# Patient Record
Sex: Female | Born: 1935 | Race: White | Hispanic: No | Smoking: Never smoker
Health system: Southern US, Community
[De-identification: ages and names within clinical notes are randomized; demographics above are authoritative.]

## PROBLEM LIST (undated history)

## (undated) DIAGNOSIS — S7290XA Unspecified fracture of unspecified femur, initial encounter for closed fracture: Secondary | ICD-10-CM

## (undated) DIAGNOSIS — Z950 Presence of cardiac pacemaker: Secondary | ICD-10-CM

## (undated) DIAGNOSIS — F0390 Unspecified dementia without behavioral disturbance: Secondary | ICD-10-CM

## (undated) DIAGNOSIS — R5381 Other malaise: Secondary | ICD-10-CM

## (undated) DIAGNOSIS — E785 Hyperlipidemia, unspecified: Secondary | ICD-10-CM

## (undated) DIAGNOSIS — R7302 Impaired glucose tolerance (oral): Secondary | ICD-10-CM

## (undated) DIAGNOSIS — F068 Other specified mental disorders due to known physiological condition: Secondary | ICD-10-CM

## (undated) DIAGNOSIS — G459 Transient cerebral ischemic attack, unspecified: Secondary | ICD-10-CM

## (undated) DIAGNOSIS — K5730 Diverticulosis of large intestine without perforation or abscess without bleeding: Secondary | ICD-10-CM

## (undated) DIAGNOSIS — R4701 Aphasia: Secondary | ICD-10-CM

## (undated) DIAGNOSIS — Z8679 Personal history of other diseases of the circulatory system: Secondary | ICD-10-CM

## (undated) DIAGNOSIS — I442 Atrioventricular block, complete: Secondary | ICD-10-CM

## (undated) DIAGNOSIS — E039 Hypothyroidism, unspecified: Secondary | ICD-10-CM

## (undated) DIAGNOSIS — I10 Essential (primary) hypertension: Secondary | ICD-10-CM

## (undated) DIAGNOSIS — I509 Heart failure, unspecified: Secondary | ICD-10-CM

## (undated) DIAGNOSIS — I6350 Cerebral infarction due to unspecified occlusion or stenosis of unspecified cerebral artery: Secondary | ICD-10-CM

## (undated) DIAGNOSIS — M1990 Unspecified osteoarthritis, unspecified site: Secondary | ICD-10-CM

## (undated) DIAGNOSIS — R5383 Other fatigue: Secondary | ICD-10-CM

## (undated) DIAGNOSIS — D509 Iron deficiency anemia, unspecified: Secondary | ICD-10-CM

## (undated) DIAGNOSIS — I2510 Atherosclerotic heart disease of native coronary artery without angina pectoris: Secondary | ICD-10-CM

## (undated) DIAGNOSIS — E739 Lactose intolerance, unspecified: Secondary | ICD-10-CM

## (undated) HISTORY — DX: Iron deficiency anemia, unspecified: D50.9

## (undated) HISTORY — DX: Transient cerebral ischemic attack, unspecified: G45.9

## (undated) HISTORY — DX: Other fatigue: R53.83

## (undated) HISTORY — DX: Personal history of other diseases of the circulatory system: Z86.79

## (undated) HISTORY — DX: Other specified mental disorders due to known physiological condition: F06.8

## (undated) HISTORY — PX: ABDOMINAL HYSTERECTOMY: SHX81

## (undated) HISTORY — DX: Hypothyroidism, unspecified: E03.9

## (undated) HISTORY — DX: Other malaise: R53.81

## (undated) HISTORY — PX: CHOLECYSTECTOMY: SHX55

## (undated) HISTORY — DX: Hyperlipidemia, unspecified: E78.5

## (undated) HISTORY — DX: Presence of cardiac pacemaker: Z95.0

## (undated) HISTORY — DX: Impaired glucose tolerance (oral): R73.02

## (undated) HISTORY — DX: Essential (primary) hypertension: I10

## (undated) HISTORY — DX: Cerebral infarction due to unspecified occlusion or stenosis of unspecified cerebral artery: I63.50

## (undated) HISTORY — DX: Lactose intolerance, unspecified: E73.9

## (undated) HISTORY — DX: Atrioventricular block, complete: I44.2

## (undated) HISTORY — DX: Aphasia: R47.01

## (undated) HISTORY — DX: Diverticulosis of large intestine without perforation or abscess without bleeding: K57.30

---

## 1989-09-26 HISTORY — PX: OTHER SURGICAL HISTORY: SHX169

## 1998-11-24 ENCOUNTER — Other Ambulatory Visit: Admission: RE | Admit: 1998-11-24 | Discharge: 1998-11-24 | Payer: Self-pay | Admitting: Obstetrics and Gynecology

## 1999-12-07 ENCOUNTER — Other Ambulatory Visit: Admission: RE | Admit: 1999-12-07 | Discharge: 1999-12-07 | Payer: Self-pay | Admitting: Obstetrics and Gynecology

## 2000-01-19 ENCOUNTER — Encounter: Admission: RE | Admit: 2000-01-19 | Discharge: 2000-01-19 | Payer: Self-pay | Admitting: Obstetrics and Gynecology

## 2000-01-19 ENCOUNTER — Encounter: Payer: Self-pay | Admitting: Obstetrics and Gynecology

## 2000-09-26 DIAGNOSIS — Z950 Presence of cardiac pacemaker: Secondary | ICD-10-CM

## 2000-09-26 HISTORY — DX: Presence of cardiac pacemaker: Z95.0

## 2002-09-26 LAB — HM COLONOSCOPY: HM Colonoscopy: NORMAL

## 2003-05-12 ENCOUNTER — Encounter: Payer: Self-pay | Admitting: Emergency Medicine

## 2003-05-12 ENCOUNTER — Inpatient Hospital Stay (HOSPITAL_COMMUNITY): Admission: EM | Admit: 2003-05-12 | Discharge: 2003-05-14 | Payer: Self-pay | Admitting: Emergency Medicine

## 2003-05-12 ENCOUNTER — Encounter: Payer: Self-pay | Admitting: Neurology

## 2003-05-13 ENCOUNTER — Encounter: Payer: Self-pay | Admitting: Neurology

## 2003-05-14 DIAGNOSIS — G459 Transient cerebral ischemic attack, unspecified: Secondary | ICD-10-CM | POA: Insufficient documentation

## 2003-05-14 HISTORY — DX: Transient cerebral ischemic attack, unspecified: G45.9

## 2003-06-30 ENCOUNTER — Emergency Department (HOSPITAL_COMMUNITY): Admission: EM | Admit: 2003-06-30 | Discharge: 2003-06-30 | Payer: Self-pay | Admitting: *Deleted

## 2003-06-30 ENCOUNTER — Encounter: Payer: Self-pay | Admitting: Emergency Medicine

## 2003-09-24 ENCOUNTER — Ambulatory Visit (HOSPITAL_COMMUNITY): Admission: RE | Admit: 2003-09-24 | Discharge: 2003-09-24 | Payer: Self-pay | Admitting: Gastroenterology

## 2003-12-27 ENCOUNTER — Emergency Department (HOSPITAL_COMMUNITY): Admission: EM | Admit: 2003-12-27 | Discharge: 2003-12-27 | Payer: Self-pay | Admitting: Emergency Medicine

## 2004-01-02 ENCOUNTER — Ambulatory Visit (HOSPITAL_COMMUNITY): Admission: RE | Admit: 2004-01-02 | Discharge: 2004-01-02 | Payer: Self-pay | Admitting: Neurology

## 2004-05-07 ENCOUNTER — Emergency Department (HOSPITAL_COMMUNITY): Admission: EM | Admit: 2004-05-07 | Discharge: 2004-05-07 | Payer: Self-pay | Admitting: *Deleted

## 2004-11-24 ENCOUNTER — Encounter: Admission: RE | Admit: 2004-11-24 | Discharge: 2004-11-24 | Payer: Self-pay | Admitting: Internal Medicine

## 2004-12-13 ENCOUNTER — Ambulatory Visit: Payer: Self-pay

## 2005-04-27 ENCOUNTER — Ambulatory Visit: Payer: Self-pay | Admitting: Internal Medicine

## 2005-06-20 ENCOUNTER — Ambulatory Visit: Payer: Self-pay

## 2005-10-04 ENCOUNTER — Ambulatory Visit: Payer: Self-pay | Admitting: Internal Medicine

## 2005-10-25 ENCOUNTER — Ambulatory Visit (HOSPITAL_COMMUNITY): Admission: RE | Admit: 2005-10-25 | Discharge: 2005-10-25 | Payer: Self-pay | Admitting: Internal Medicine

## 2005-12-08 ENCOUNTER — Encounter: Admission: RE | Admit: 2005-12-08 | Discharge: 2005-12-08 | Payer: Self-pay | Admitting: Internal Medicine

## 2005-12-13 ENCOUNTER — Ambulatory Visit: Payer: Self-pay | Admitting: Internal Medicine

## 2006-01-25 ENCOUNTER — Ambulatory Visit: Payer: Self-pay | Admitting: Internal Medicine

## 2006-03-01 ENCOUNTER — Ambulatory Visit: Payer: Self-pay | Admitting: Internal Medicine

## 2006-04-05 ENCOUNTER — Ambulatory Visit: Payer: Self-pay | Admitting: Internal Medicine

## 2006-05-25 ENCOUNTER — Ambulatory Visit: Payer: Self-pay | Admitting: Internal Medicine

## 2006-05-31 ENCOUNTER — Ambulatory Visit: Payer: Self-pay | Admitting: Internal Medicine

## 2006-06-28 ENCOUNTER — Ambulatory Visit: Payer: Self-pay | Admitting: Internal Medicine

## 2006-08-30 ENCOUNTER — Ambulatory Visit: Payer: Self-pay | Admitting: Internal Medicine

## 2006-09-25 ENCOUNTER — Ambulatory Visit: Payer: Self-pay | Admitting: Internal Medicine

## 2006-11-01 ENCOUNTER — Ambulatory Visit: Payer: Self-pay | Admitting: Internal Medicine

## 2006-12-05 ENCOUNTER — Ambulatory Visit: Payer: Self-pay | Admitting: Internal Medicine

## 2006-12-06 ENCOUNTER — Ambulatory Visit: Payer: Self-pay | Admitting: Internal Medicine

## 2006-12-13 ENCOUNTER — Encounter: Admission: RE | Admit: 2006-12-13 | Discharge: 2006-12-13 | Payer: Self-pay | Admitting: Internal Medicine

## 2007-01-01 ENCOUNTER — Encounter: Admission: RE | Admit: 2007-01-01 | Discharge: 2007-01-01 | Payer: Self-pay | Admitting: Internal Medicine

## 2007-03-07 ENCOUNTER — Ambulatory Visit: Payer: Self-pay | Admitting: Internal Medicine

## 2007-06-06 ENCOUNTER — Ambulatory Visit: Payer: Self-pay | Admitting: Internal Medicine

## 2007-07-12 ENCOUNTER — Telehealth (INDEPENDENT_AMBULATORY_CARE_PROVIDER_SITE_OTHER): Payer: Self-pay | Admitting: *Deleted

## 2007-09-05 ENCOUNTER — Ambulatory Visit: Payer: Self-pay | Admitting: Internal Medicine

## 2007-11-20 ENCOUNTER — Ambulatory Visit: Payer: Self-pay | Admitting: Internal Medicine

## 2007-11-20 DIAGNOSIS — I10 Essential (primary) hypertension: Secondary | ICD-10-CM

## 2007-11-20 DIAGNOSIS — K5730 Diverticulosis of large intestine without perforation or abscess without bleeding: Secondary | ICD-10-CM | POA: Insufficient documentation

## 2007-11-20 DIAGNOSIS — E039 Hypothyroidism, unspecified: Secondary | ICD-10-CM

## 2007-11-20 DIAGNOSIS — R5383 Other fatigue: Secondary | ICD-10-CM

## 2007-11-20 DIAGNOSIS — Z8679 Personal history of other diseases of the circulatory system: Secondary | ICD-10-CM

## 2007-11-20 DIAGNOSIS — E785 Hyperlipidemia, unspecified: Secondary | ICD-10-CM

## 2007-11-20 DIAGNOSIS — R5381 Other malaise: Secondary | ICD-10-CM

## 2007-11-20 HISTORY — DX: Hyperlipidemia, unspecified: E78.5

## 2007-11-20 HISTORY — DX: Diverticulosis of large intestine without perforation or abscess without bleeding: K57.30

## 2007-11-20 HISTORY — DX: Essential (primary) hypertension: I10

## 2007-11-20 HISTORY — DX: Personal history of other diseases of the circulatory system: Z86.79

## 2007-11-20 HISTORY — DX: Hypothyroidism, unspecified: E03.9

## 2007-11-20 HISTORY — DX: Other malaise: R53.81

## 2007-11-20 LAB — CONVERTED CEMR LAB
ALT: 18 units/L (ref 0–35)
AST: 22 units/L (ref 0–37)
Albumin: 3.6 g/dL (ref 3.5–5.2)
Alkaline Phosphatase: 72 units/L (ref 39–117)
BUN: 15 mg/dL (ref 6–23)
Basophils Absolute: 0.1 10*3/uL (ref 0.0–0.1)
Basophils Relative: 0.8 % (ref 0.0–1.0)
Bilirubin, Direct: 0.1 mg/dL (ref 0.0–0.3)
CO2: 33 meq/L — ABNORMAL HIGH (ref 19–32)
Calcium: 9.4 mg/dL (ref 8.4–10.5)
Chloride: 102 meq/L (ref 96–112)
Cholesterol: 137 mg/dL (ref 0–200)
Creatinine, Ser: 0.9 mg/dL (ref 0.4–1.2)
Eosinophils Absolute: 0.5 10*3/uL (ref 0.0–0.6)
Eosinophils Relative: 5 % (ref 0.0–5.0)
GFR calc Af Amer: 77 mL/min
GFR calc non Af Amer: 64 mL/min
Glucose, Bld: 109 mg/dL — ABNORMAL HIGH (ref 70–99)
HCT: 41.9 % (ref 36.0–46.0)
HDL: 46 mg/dL (ref >39.0)
Hemoglobin: 13.7 g/dL (ref 12.0–15.0)
LDL Cholesterol: 70 mg/dL (ref 0–99)
Lymphocytes Relative: 16.8 % (ref 12.0–46.0)
MCHC: 32.8 g/dL (ref 30.0–36.0)
MCV: 94.6 fL (ref 78.0–100.0)
Monocytes Absolute: 0.8 10*3/uL — ABNORMAL HIGH (ref 0.2–0.7)
Monocytes Relative: 7.8 % (ref 3.0–11.0)
Neutro Abs: 7 10*3/uL (ref 1.4–7.7)
Neutrophils Relative %: 69.6 % (ref 43.0–77.0)
Platelets: 353 10*3/uL (ref 150–400)
Potassium: 3.4 meq/L — ABNORMAL LOW (ref 3.5–5.1)
RBC: 4.43 M/uL (ref 3.87–5.11)
RDW: 12 % (ref 11.5–14.6)
Sodium: 141 meq/L (ref 135–145)
TSH: 4.66 microintl units/mL (ref 0.35–5.50)
Total Bilirubin: 0.5 mg/dL (ref 0.3–1.2)
Total CHOL/HDL Ratio: 3
Total Protein: 7 g/dL (ref 6.0–8.3)
Triglycerides: 106 mg/dL (ref 0–149)
VLDL: 21 mg/dL (ref 0–40)
WBC: 10.1 10*3/uL (ref 4.5–10.5)

## 2007-12-12 ENCOUNTER — Ambulatory Visit: Payer: Self-pay | Admitting: Internal Medicine

## 2007-12-27 ENCOUNTER — Encounter: Admission: RE | Admit: 2007-12-27 | Discharge: 2007-12-27 | Payer: Self-pay | Admitting: Internal Medicine

## 2008-03-12 ENCOUNTER — Ambulatory Visit: Payer: Self-pay | Admitting: Internal Medicine

## 2008-05-19 ENCOUNTER — Ambulatory Visit: Payer: Self-pay | Admitting: Internal Medicine

## 2008-05-19 DIAGNOSIS — F068 Other specified mental disorders due to known physiological condition: Secondary | ICD-10-CM

## 2008-05-19 DIAGNOSIS — F0390 Unspecified dementia without behavioral disturbance: Secondary | ICD-10-CM | POA: Insufficient documentation

## 2008-05-19 HISTORY — DX: Other specified mental disorders due to known physiological condition: F06.8

## 2008-06-11 ENCOUNTER — Ambulatory Visit: Payer: Self-pay | Admitting: Internal Medicine

## 2008-09-11 ENCOUNTER — Ambulatory Visit: Payer: Self-pay | Admitting: Internal Medicine

## 2008-11-01 ENCOUNTER — Telehealth: Payer: Self-pay | Admitting: Internal Medicine

## 2008-11-17 ENCOUNTER — Ambulatory Visit: Payer: Self-pay | Admitting: Internal Medicine

## 2008-11-17 LAB — CONVERTED CEMR LAB
AST: 23 units/L (ref 0–37)
Albumin: 3.5 g/dL (ref 3.5–5.2)
Basophils Absolute: 0.1 10*3/uL (ref 0.0–0.1)
Basophils Relative: 0.9 % (ref 0.0–3.0)
Calcium: 9.1 mg/dL (ref 8.4–10.5)
Cholesterol: 142 mg/dL (ref 0–200)
Creatinine, Ser: 0.9 mg/dL (ref 0.4–1.2)
Folate: >20 ng/mL
Glucose, Bld: 100 mg/dL — ABNORMAL HIGH (ref 70–99)
Hemoglobin: 12.2 g/dL (ref 12.0–15.0)
LDL Cholesterol: 66 mg/dL (ref 0–99)
MCHC: 35.3 g/dL (ref 30.0–36.0)
Monocytes Absolute: 0.7 10*3/uL (ref 0.1–1.0)
Monocytes Relative: 8.2 % (ref 3.0–12.0)
Neutro Abs: 5.6 10*3/uL (ref 1.4–7.7)
Sed Rate: 40 mm/hr — ABNORMAL HIGH (ref 0–22)
Sodium: 141 meq/L (ref 135–145)
TSH: 1.8 microintl units/mL (ref 0.35–5.50)
Total Protein: 6.5 g/dL (ref 6.0–8.3)
VLDL: 23 mg/dL (ref 0–40)
Vitamin B-12: >1500 pg/mL — ABNORMAL HIGH (ref 211–911)
WBC: 9 10*3/uL (ref 4.5–10.5)

## 2008-12-25 LAB — HM MAMMOGRAPHY: HM Mammogram: NORMAL

## 2009-01-02 ENCOUNTER — Encounter: Payer: Self-pay | Admitting: Internal Medicine

## 2009-01-09 ENCOUNTER — Encounter: Payer: Self-pay | Admitting: Internal Medicine

## 2009-01-09 ENCOUNTER — Ambulatory Visit: Payer: Self-pay | Admitting: Internal Medicine

## 2009-01-09 DIAGNOSIS — I639 Cerebral infarction, unspecified: Secondary | ICD-10-CM

## 2009-01-09 DIAGNOSIS — R4701 Aphasia: Secondary | ICD-10-CM

## 2009-01-09 DIAGNOSIS — Z950 Presence of cardiac pacemaker: Secondary | ICD-10-CM

## 2009-01-09 DIAGNOSIS — I442 Atrioventricular block, complete: Secondary | ICD-10-CM

## 2009-01-09 DIAGNOSIS — I6350 Cerebral infarction due to unspecified occlusion or stenosis of unspecified cerebral artery: Secondary | ICD-10-CM

## 2009-01-09 DIAGNOSIS — R32 Unspecified urinary incontinence: Secondary | ICD-10-CM

## 2009-01-09 HISTORY — DX: Presence of cardiac pacemaker: Z95.0

## 2009-01-09 HISTORY — DX: Atrioventricular block, complete: I44.2

## 2009-01-09 HISTORY — DX: Aphasia: R47.01

## 2009-01-09 HISTORY — DX: Cerebral infarction due to unspecified occlusion or stenosis of unspecified cerebral artery: I63.50

## 2009-04-07 ENCOUNTER — Ambulatory Visit: Payer: Self-pay | Admitting: Internal Medicine

## 2009-04-08 ENCOUNTER — Ambulatory Visit: Payer: Self-pay | Admitting: Internal Medicine

## 2009-04-09 ENCOUNTER — Telehealth: Payer: Self-pay | Admitting: Internal Medicine

## 2009-04-13 ENCOUNTER — Encounter: Payer: Self-pay | Admitting: Internal Medicine

## 2009-04-14 ENCOUNTER — Telehealth: Payer: Self-pay | Admitting: Internal Medicine

## 2009-04-15 ENCOUNTER — Ambulatory Visit (HOSPITAL_COMMUNITY): Admission: RE | Admit: 2009-04-15 | Discharge: 2009-04-15 | Payer: Self-pay | Admitting: Internal Medicine

## 2009-04-15 ENCOUNTER — Ambulatory Visit: Payer: Self-pay | Admitting: Internal Medicine

## 2009-04-16 ENCOUNTER — Encounter: Payer: Self-pay | Admitting: Internal Medicine

## 2009-04-29 ENCOUNTER — Ambulatory Visit: Payer: Self-pay

## 2009-04-29 ENCOUNTER — Encounter: Payer: Self-pay | Admitting: Internal Medicine

## 2009-05-18 ENCOUNTER — Ambulatory Visit: Payer: Self-pay | Admitting: Internal Medicine

## 2009-08-14 ENCOUNTER — Ambulatory Visit: Payer: Self-pay | Admitting: Internal Medicine

## 2009-10-13 ENCOUNTER — Emergency Department (HOSPITAL_BASED_OUTPATIENT_CLINIC_OR_DEPARTMENT_OTHER): Admission: EM | Admit: 2009-10-13 | Discharge: 2009-10-13 | Payer: Self-pay | Admitting: Emergency Medicine

## 2009-10-13 ENCOUNTER — Ambulatory Visit: Payer: Self-pay | Admitting: Radiology

## 2009-11-18 ENCOUNTER — Ambulatory Visit: Payer: Self-pay | Admitting: Internal Medicine

## 2009-11-19 LAB — CONVERTED CEMR LAB
AST: 24 units/L (ref 0–37)
Alkaline Phosphatase: 69 units/L (ref 39–117)
Basophils Absolute: 0.1 10*3/uL (ref 0.0–0.1)
Bilirubin Urine: NEGATIVE
Bilirubin, Direct: 0.1 mg/dL (ref 0.0–0.3)
CO2: 27 meq/L (ref 19–32)
Calcium: 8.6 mg/dL (ref 8.4–10.5)
Cholesterol: 145 mg/dL (ref 0–200)
Eosinophils Absolute: 0.3 10*3/uL (ref 0.0–0.7)
Folate: >20 ng/mL
HDL: 56.7 mg/dL (ref >39.00)
Hemoglobin: 12.5 g/dL (ref 12.0–15.0)
Hgb A1c MFr Bld: 6 % (ref 4.6–6.5)
Lymphs Abs: 2.1 10*3/uL (ref 0.7–4.0)
MCHC: 33.4 g/dL (ref 30.0–36.0)
MCV: 95.1 fL (ref 78.0–100.0)
Monocytes Relative: 10.1 % (ref 3.0–12.0)
RBC: 3.93 M/uL (ref 3.87–5.11)
Sed Rate: 31 mm/hr — ABNORMAL HIGH (ref 0–22)
Sodium: 142 meq/L (ref 135–145)
Specific Gravity, Urine: <=1.005 (ref 1.000–1.030)
TSH: 1.83 microintl units/mL (ref 0.35–5.50)
Total Bilirubin: 0.3 mg/dL (ref 0.3–1.2)
Total Protein, Urine: NEGATIVE mg/dL
Total Protein: 6.6 g/dL (ref 6.0–8.3)
Urobilinogen, UA: 0.2 (ref 0.0–1.0)
VLDL: 53.2 mg/dL — ABNORMAL HIGH (ref 0.0–40.0)
pH: 5.5 (ref 5.0–8.0)

## 2009-11-27 ENCOUNTER — Telehealth: Payer: Self-pay | Admitting: Internal Medicine

## 2009-12-01 ENCOUNTER — Telehealth: Payer: Self-pay | Admitting: Internal Medicine

## 2010-05-14 ENCOUNTER — Encounter: Payer: Self-pay | Admitting: Internal Medicine

## 2010-05-19 ENCOUNTER — Ambulatory Visit: Payer: Self-pay | Admitting: Internal Medicine

## 2010-07-19 ENCOUNTER — Encounter: Payer: Self-pay | Admitting: Internal Medicine

## 2010-07-23 ENCOUNTER — Encounter (INDEPENDENT_AMBULATORY_CARE_PROVIDER_SITE_OTHER): Payer: Self-pay | Admitting: *Deleted

## 2010-08-10 ENCOUNTER — Ambulatory Visit: Payer: Self-pay | Admitting: Internal Medicine

## 2010-10-26 NOTE — Progress Notes (Signed)
  Phone Note Refill Request  on December 01, 2009 1:12 PM  Refills Requested: Medication #1:  AGGRENOX 25-200 MG CP12 1 by mouth two times a day   Dosage confirmed as above?Dosage Confirmed   Notes: [facility] [address] Initial call taken by: Scharlene Gloss,  December 01, 2009 1:12 PM    Prescriptions: AGGRENOX 25-200 MG CP12 (ASPIRIN-DIPYRIDAMOLE) 1 by mouth two times a day  #60 x 11   Entered by:   Zella Ball Ewing   Authorized by:   Corwin Levins MD   Signed by:   Scharlene Gloss on 12/01/2009   Method used:   Faxed to ...       [facility]  [address] [phone]* (retail)       [address]       [place], [place]  [address]       Ph: [phone] or [phone]       Fax: [phone]   RxID:   [id number]

## 2010-10-26 NOTE — Assessment & Plan Note (Signed)
Summary: pc2/medtronic/lg    Visit Type:  Follow-up Referring Provider:  Dr. Pearlean Brownie Primary Provider:  Wendie Simmer   History of Present Illness: [name] returns today for followup.  She is a pleasant elderly woman with a h/o CHB, s/p PPM, a h/o HTN and CVA.  She denies c/p or sob.  She admits to some trouble with her memory.  No change in appetite, constipation or diarrhea. She has remained active. No peripheral edema.  Current Medications (verified): 1)  Aggrenox 25-200 Mg Cp12 (Aspirin-Dipyridamole) .Marland Kitchen.. 1 By Mouth Two Times A Day 2)  Aricept 10 Mg Tabs (Donepezil Hcl) .... Take 1 Tablet By Mouth Once A Day 3)  Diovan 160 Mg Tabs (Valsartan) .Marland Kitchen.. 1 By Mouth Once Daily 4)  Foltx 2.5-25-2 Mg Tabs (Fa-Pyridoxine-Cyancobalamin) .Marland Kitchen.. 1 Tablet By Mouth Once A Day 5)  Hydrochlorothiazide 12.5 Mg Caps (Hydrochlorothiazide) .Marland Kitchen.. 1 By Mouth Once Daily 6)  Lipitor 10 Mg Tabs (Atorvastatin Calcium) .Marland Kitchen.. 1 By Mouth Once Daily 7)  Norvasc 5 Mg Tabs (Amlodipine Besylate) .Marland Kitchen.. 1po Once Daily 8)  Synthroid 112 Mcg Tabs (Levothyroxine Sodium) .... Take 1 Tablet By Mouth Once A Day 9)  Caltrate 600+d 600-400 Mg-Unit Tabs (Calcium Carbonate-Vitamin D) .Marland Kitchen.. 1 By Mouth Once Daily 10)  Multivitamins   Tabs (Multiple Vitamin) .Marland Kitchen.. 1 By Mouth Once Daily  Allergies: 1)  ! Codeine  Past History:  Past Medical History: Last updated: 01/09/2009  PACEMAKER (ICD-V45.Marland Kitchen01) AV BLOCK, COMPLETE (ICD-426.0) URINARY INCONTINENCE (ICD-788.30) APHASIA (ICD-784.3) TIA (ICD-435.9) CVA (ICD-434.91) DEMENTIA (ICD-294.8) FATIGUE (ICD-780.79) DIVERTICULOSIS, COLON (ICD-562.10) HYPOTHYROIDISM (ICD-244.9) CEREBROVASCULAR ACCIDENT, HX OF (ICD-V12.50) HYPERTENSION (ICD-401.9) HYPERLIPIDEMIA (ICD-272.4)    Past Surgical History: Last updated: 08/13/2009 Medtronic Cynthia dual-chamber pacemaker.serial number was [id number]....04/15/2009  Hysterectomy s/p cerebral aneurysm 1991 s/p pacemaker  DDD Cholecystectomy s/p retinal re-attachment s/p thyroidectomy  Review of Systems  The patient denies chest pain, syncope, dyspnea on exertion, and peripheral edema.    Vital Signs:  Patient profile:   77 year old female Height:      66 inches Weight:      130 pounds BMI:     21.06 Pulse rate:   76 / minute BP sitting:   122 / 60  (left arm)  Vitals Entered By: Laurance Flatten CMA (August 10, 2010 10:23 AM)  Physical Exam  General:  alert and underweight appearing.   Head:  normocephalic and atraumatic.   Eyes:  vision grossly intact, pupils equal, and pupils round.   Mouth:  no gingival abnormalities and pharynx pink and moist.   Neck:  supple and no masses.   Chest Wall:  Well healed PPM incision. Lungs:  normal respiratory effort and normal breath sounds.   Heart:  normal rate and regular rhythm.   Abdomen:  soft, non-tender, and normal bowel sounds.   Msk:  no joint tenderness and no joint swelling.   Pulses:  pulses normal in all 4 extremities Extremities:  no edema, no erythema  Neurologic:  remains pleasantly demented   PPM Specifications Following MD:  Lewayne Bunting, MD     PPM Vendor:  Medtronic     PPM Model Number:  [id number]     PPM Serial Number:  [id number] PPM DOI:  04/15/2009     PPM Implanting MD:  Lewayne Bunting, MD  Lead 1    Location: RA     DOI: 06/16/2007     Model #: [id number]     Serial #: [id number]     Status: active Lead 2  Location: RV     DOI: 06/15/2001     Model #: 1610     Serial #: [id number]     Status: active  Magnet Response Rate:  BOL 85 ERI 65  Indications:  CHB Carelink  Explantation Comments:  04/15/2009 [id number] H explanted  PPM Follow Up Battery Voltage:  2.79 V     Battery Est. Longevity:  10.5 YRS     Pacer Dependent:  No       PPM Device Measurements Atrium  Amplitude: 5.60 mV, Impedance: 626 ohms, Threshold: 1.00 V at 0.40 msec Right Ventricle  Amplitude: 11.20 mV, Impedance: 573 ohms, Threshold: 0.750 V at 0.40  msec  Episodes MS Episodes:  24     Percent Mode Switch:  <0.1%     Coumadin:  No Ventricular High Rate:  0     Atrial Pacing:  31.4%     Ventricular Pacing:  99.9%  Parameters Mode:  DDDR     Lower Rate Limit:  60     Upper Rate Limit:  130 Paced AV Delay:  150     Sensed AV Delay:  120 Next Remote Date:  11/11/2010     Next Cardiology Appt Due:  07/28/2011 Tech Comments:  24 MODE SWITCHES.  NORMAL DEVICE FUNCTION.  CHANGED RA OUTPUT FROM 1.5 TO 2.0 AND RV OUTPUT FROM 2.0 TO 2.5 V. CARELINK 11-11-10 AND ROV IN 12 MTHS W/GT. Vella Kohler  August 10, 2010 10:35 AM MD Comments:  Agree with above.  Impression & Recommendations:  Problem # 1:  PACEMAKER, PERMANENT (ICD-V45.01) Her device has worked well.  Will recheck in several months.  Problem # 2:  HYPERTENSION (ICD-401.9) Her blood pressure is well controlled.  Continue meds as below. Her updated medication list for this problem includes:    Diovan 160 Mg Tabs (Valsartan) .Marland Kitchen... 1 by mouth once daily    Hydrochlorothiazide 12.5 Mg Caps (Hydrochlorothiazide) .Marland Kitchen... 1 by mouth once daily    Norvasc 5 Mg Tabs (Amlodipine besylate) .Marland Kitchen... 1po once daily

## 2010-10-26 NOTE — Progress Notes (Signed)
Summary: med refill  Phone Note From Pharmacy   Summary of Call: Requesting refill of Folbic Tablet. Please advise. Initial call taken by: Lucious Groves,  November 27, 2009 12:18 PM  Follow-up for Phone Call        thanks. Closed phone note. Follow-up by: Lucious Groves,  November 27, 2009 3:46 PM    New/Updated Medications: FOLTX 2.5-25-2 MG TABS (FA-PYRIDOXINE-CYANCOBALAMIN) 1 tablet by mouth once a day Prescriptions: FOLTX 2.5-25-2 MG TABS (FA-PYRIDOXINE-CYANCOBALAMIN) 1 tablet by mouth once a day  #30 x 11   Entered and Authorized by:   Corwin Levins MD   Signed by:   Corwin Levins MD on 11/27/2009   Method used:   Electronically to        [facility] [address] [phone]* (retail)       [address]       [place], [place]  [address]       Ph: [phone] or [phone]       Fax: [phone]   RxID:   [id number]  done escript Corwin Levins MD  November 27, 2009 3:38 PM

## 2010-10-26 NOTE — Letter (Signed)
Summary: Device-Delinquent Check  [facility], Main Office  [address]   [place], [place] [address]   Phone: [phone]  Fax: [phone]     May 14, 2010 MRN: [id number]   [facility] [address] [place], [place]  [address]   Dear [name],  According to our records, you have not had your implanted device checked in the recommended period of time.  We are unable to determine appropriate device function without checking your device on a regular basis.  Please call our office to schedule an appointment as soon as possible.  If you are having your device checked by another physician, please call us so that we may update our records.  Thank you,   [facility]

## 2010-10-26 NOTE — Cardiovascular Report (Signed)
Summary: Office Visit   Office Visit   Imported By: Roderic Ovens 09/02/2010 13:56:35  _____________________________________________________________________  External Attachment:    Type:   Image     Comment:   External Document

## 2010-10-26 NOTE — Miscellaneous (Signed)
Summary: dx code correction   Clinical Lists Changes  Problems: Changed problem from PACEMAKER (ICD-V45..01) to PACEMAKER, PERMANENT (ICD-V45.01) changed the incorrect dx code to correct dx code Donna Keene  July 23, 2010 11:13 AM 

## 2010-10-26 NOTE — Miscellaneous (Signed)
Summary: Immunization Entry   Immunization History:  Influenza Immunization History:    Influenza:  historical (04/28/2010)   [facility] Vaccine, Flulaval GSK Lot #[id number] Site, Left Deltoid IM Date Administered 04/28/2010

## 2010-10-26 NOTE — Assessment & Plan Note (Signed)
Summary: 6 MO ROV /NWS $50   Vital Signs:  Patient profile:   77 year old female Height:      65 inches Weight:      136.25 pounds O2 Sat:      97 % on Room air Temp:     97.5 degrees F oral Pulse rate:   74 / minute BP sitting:   110 / 60  (left arm) Cuff size:   regular  Vitals Entered ByZella Ball Ewing (November 18, 2009 1:43 PM)  O2 Flow:  Room air  Preventive Care Screening  Mammogram:    Date:  12/25/2008    Results:  normal   CC: 6 Month ROV/RE   Primary Care Provider:  Wendie Simmer  CC:  6 Month ROV/RE.  History of Present Illness: unfort fell on the ice backwards but no problems since then;  overall doing quite nicely, here with family who agree no specific complaints;  Pt denies CP, sob, doe, wheezing, orthopnea, pnd, worsening LE edema, palps, dizziness or syncope   Pt denies new neuro symptoms such as headache, facial or extremity weakness .    Here for wellness Diet: Heart Healthy or DM if diabetic Physical Activities: Sedentary, but walks the small dog two times a day to the street and back Depression/mood screen: Negative Hearing: Intact bilateral grossly with mild loss only Visual Acuity: Grossly normal, sees optho  ADL's: Capable, lives at assisted living, not in the memory care  Fall Risk: mild Home Safety: Good End-of-Life Planning: Advance directive - DNR per pt/I agree, and son is Health POA - Charles Bourdon [phone] (cell); home is [phone]    Problems Prior to Update: 1)  Fatigue  (ICD-780.79) 2)  Pacemaker  (ICD-V45.Marland Kitchen01) 3)  Av Block, Complete  (ICD-426.0) 4)  Urinary Incontinence  (ICD-788.30) 5)  Aphasia  (ICD-784.3) 6)  Tia  (ICD-435.9) 7)  Cva  (ICD-434.91) 8)  Dementia  (ICD-294.8) 9)  Fatigue  (ICD-780.79) 10)  Diverticulosis, Colon  (ICD-562.10) 11)  Hypothyroidism  (ICD-244.9) 12)  Cerebrovascular Accident, Hx of  (ICD-V12.50) 13)  Hypertension  (ICD-401.9) 14)  Hyperlipidemia  (ICD-272.4)  Medications Prior to  Update: 1)  Aggrenox 25-200 Mg Cp12 (Aspirin-Dipyridamole) .Marland Kitchen.. 1 By Mouth Two Times A Day 2)  Aricept 10 Mg Tabs (Donepezil Hcl) .... Take 1 Tablet By Mouth Once A Day 3)  Diovan 160 Mg Tabs (Valsartan) .Marland Kitchen.. 1 By Mouth Once Daily 4)  Foltx 2.5-25-2 Mg Tabs (Fa-Pyridoxine-Cyancobalamin) .Marland Kitchen.. 1 Tablet By Mouth Once A Day 5)  Hydrochlorothiazide 12.5 Mg Caps (Hydrochlorothiazide) .Marland Kitchen.. 1 By Mouth Once Daily 6)  Lipitor 10 Mg Tabs (Atorvastatin Calcium) .Marland Kitchen.. 1 By Mouth Once Daily 7)  Norvasc 5 Mg Tabs (Amlodipine Besylate) .Marland Kitchen.. 1po Once Daily 8)  Synthroid 112 Mcg Tabs (Levothyroxine Sodium) .... Take 1 Tablet By Mouth Once A Day 9)  Caltrate 600+d 600-400 Mg-Unit Tabs (Calcium Carbonate-Vitamin D) .Marland Kitchen.. 1 By Mouth Once Daily 10)  Multivitamins   Tabs (Multiple Vitamin) .Marland Kitchen.. 1 By Mouth Once Daily  Current Medications (verified): 1)  Aggrenox 25-200 Mg Cp12 (Aspirin-Dipyridamole) .Marland Kitchen.. 1 By Mouth Two Times A Day 2)  Aricept 10 Mg Tabs (Donepezil Hcl) .... Take 1 Tablet By Mouth Once A Day 3)  Diovan 160 Mg Tabs (Valsartan) .Marland Kitchen.. 1 By Mouth Once Daily 4)  Foltx 2.5-25-2 Mg Tabs (Fa-Pyridoxine-Cyancobalamin) .Marland Kitchen.. 1 Tablet By Mouth Once A Day 5)  Hydrochlorothiazide 12.5 Mg Caps (Hydrochlorothiazide) .Marland Kitchen.. 1 By Mouth Once Daily 6)  Lipitor  10 Mg Tabs (Atorvastatin Calcium) .Marland Kitchen.. 1 By Mouth Once Daily 7)  Norvasc 5 Mg Tabs (Amlodipine Besylate) .Marland Kitchen.. 1po Once Daily 8)  Synthroid 112 Mcg Tabs (Levothyroxine Sodium) .... Take 1 Tablet By Mouth Once A Day 9)  Caltrate 600+d 600-400 Mg-Unit Tabs (Calcium Carbonate-Vitamin D) .Marland Kitchen.. 1 By Mouth Once Daily 10)  Multivitamins   Tabs (Multiple Vitamin) .Marland Kitchen.. 1 By Mouth Once Daily  Allergies (verified): 1)  ! Codeine  Directives (verified): 1)  Do Not Resuscitate   Past History:  Past Medical History: Last updated: 01/09/2009  PACEMAKER (ICD-V45.Marland Kitchen01) AV BLOCK, COMPLETE (ICD-426.0) URINARY INCONTINENCE (ICD-788.30) APHASIA (ICD-784.3) TIA (ICD-435.9) CVA  (ICD-434.91) DEMENTIA (ICD-294.8) FATIGUE (ICD-780.79) DIVERTICULOSIS, COLON (ICD-562.10) HYPOTHYROIDISM (ICD-244.9) CEREBROVASCULAR ACCIDENT, HX OF (ICD-V12.50) HYPERTENSION (ICD-401.9) HYPERLIPIDEMIA (ICD-272.4)    Past Surgical History: Last updated: 08/13/2009 Medtronic Cynthia dual-chamber pacemaker.serial number was [id number]....04/15/2009  Hysterectomy s/p cerebral aneurysm 1991 s/p pacemaker DDD Cholecystectomy s/p retinal re-attachment s/p thyroidectomy  Family History: Last updated: 11/20/2007 mother with breast cancer  Social History: Last updated: 05/19/2008 Divorced 2 son Never Smoked 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         all otherwise negative per pt  - except for mild ongoing fatigue, no OSA symptoms, fever, wt loss, night sweats  Physical Exam  General:  alert and well-developed.   Head:  normocephalic and atraumatic.   Eyes:  vision grossly intact, pupils equal, and pupils round.   Ears:  R ear normal and L ear normal.   Nose:  no external deformity and no nasal discharge.   Mouth:  no gingival abnormalities and pharynx pink and moist.   Neck:  supple and no masses.   Lungs:  normal respiratory effort and normal breath sounds.   Heart:  normal rate and regular rhythm.   Abdomen:  soft, non-tender, and normal bowel sounds.   Msk:  no joint tenderness and no joint swelling.   Extremities:  no edema, no erythema  Neurologic:  cranial nerves II-XII intact and strength normal  in all extremities. , has mod to severe memory dysfunction   Impression & Recommendations:  Problem # 1:  Preventive Health Care (ICD-V70.0)  Overall doing well, age appropriate education and counseling updated and referral for appropriate preventive services done unless declined, immunizations up to date or declined, diet counseling done if overweight, urged to quit smoking if smokes , most recent labs reviewed and current ordered if appropriate, ecg reviewed or declined (interpretation per ECG scanned in the EMR if done); information regarding Medicare Prevention requirements given if appropriate   Orders: First annual wellness visit with prevention plan  (W0981)  Problem # 2:  HYPERTENSION (ICD-401.9)  Her updated medication list for this problem includes:    Diovan 160 Mg Tabs (Valsartan) .Marland Kitchen... 1 by mouth once daily    Hydrochlorothiazide 12.5 Mg Caps (Hydrochlorothiazide) .Marland Kitchen... 1 by mouth once daily    Norvasc 5 Mg Tabs (Amlodipine besylate) .Marland Kitchen... 1po once daily  BP today: 110/60 Prior BP: 114/70 (08/14/2009)  Labs Reviewed: K+: 4.1 (11/17/2008) Creat: : 0.9 (11/17/2008)   Chol: 142 (11/17/2008)   HDL: 53.0 (11/17/2008)   LDL: 66 (11/17/2008)  TG: 114 (11/17/2008) stable overall by hx and exam, ok to continue meds/tx as is   Problem # 3:  HYPERLIPIDEMIA (ICD-272.4)  Her updated medication list for this problem includes:    Lipitor 10 Mg Tabs (Atorvastatin calcium) .Marland Kitchen... 1 by mouth once daily  Orders: TLB-Lipid Panel (80061-LIPID) Prescription Created Electronically [phone])  Labs Reviewed: SGOT: 23 (11/17/2008)   SGPT: 16 (11/17/2008)   HDL:53.0 (11/17/2008), 46.0 (11/20/2007)  LDL:66 (11/17/2008), 70 (11/20/2007)  Chol:142 (11/17/2008), 137 (11/20/2007)  Trig:114 (11/17/2008), 106 (11/20/2007) stable overall by hx and exam, ok to continue meds/tx as is , Pt to continue diet efforts, good med tolerance; to check labs - goal LDL less than 70   Problem # 4:  FATIGUE  (ICD-780.79)  mild, prob geriatric decline but still relatively active - exam benign, to check labs below; follow with expectant management   Orders: TLB-BMP (Basic Metabolic Panel-BMET) (80048-METABOL) TLB-CBC Platelet - w/Differential (85025-CBCD) TLB-Hepatic/Liver Function Pnl (80076-HEPATIC) TLB-TSH (Thyroid Stimulating Hormone) (84443-TSH) TLB-Sedimentation Rate (ESR) (85652-ESR) TLB-IBC Pnl (Iron/FE;Transferrin) (83550-IBC) TLB-B12 + Folate Pnl (82746_82607-B12/FOL) TLB-Udip ONLY (81003-UDIP)  Problem # 5:  HYPOTHYROIDISM (ICD-244.9)  Her updated medication list for this problem includes:    Synthroid 112 Mcg Tabs (Levothyroxine sodium) .Marland Kitchen... Take 1 tablet by mouth once a day  Labs Reviewed: TSH: 1.80 (11/17/2008)    Chol: 142 (11/17/2008)   HDL: 53.0 (11/17/2008)   LDL: 66 (11/17/2008)   TG: 114 (11/17/2008) stable overall by hx and exam, ok to continue meds/tx as is, to check TSH today  Complete Medication List: 1)  Aggrenox 25-200 Mg Cp12 (Aspirin-dipyridamole) .Marland Kitchen.. 1 by mouth two times a day 2)  Aricept 10 Mg Tabs (Donepezil hcl) .... Take 1 tablet by mouth once a day 3)  Diovan 160 Mg Tabs (Valsartan) .Marland Kitchen.. 1 by mouth once daily 4)  Foltx 2.5-25-2 Mg Tabs (Fa-pyridoxine-cyancobalamin) .Marland Kitchen.. 1 tablet by mouth once a day 5)  Hydrochlorothiazide 12.5 Mg Caps (Hydrochlorothiazide) .Marland Kitchen.. 1 by mouth once daily 6)  Lipitor 10 Mg Tabs (Atorvastatin calcium) .Marland Kitchen.. 1 by mouth once daily 7)  Norvasc 5 Mg Tabs (Amlodipine besylate) .Marland Kitchen.. 1po once daily 8)  Synthroid 112 Mcg Tabs (Levothyroxine sodium) .... Take 1 tablet by mouth once a day 9)  Caltrate 600+d 600-400 Mg-unit Tabs (Calcium carbonate-vitamin d) .Marland Kitchen.. 1 by mouth once daily 10)  Multivitamins Tabs (Multiple vitamin) .Marland Kitchen.. 1 by mouth once daily  Other Orders: TD Toxoids IM 7 YR + ([address]) Admin 1st Vaccine (09811)  Patient Instructions: 1)  you had the tetanus shot today 2)  Please go to the Lab in the basement for your  blood and/or urine tests today  3)  Continue all previous medications as before this visit  4)  Please schedule a follow-up appointment in 6 months.   Immunizations Administered:  Tetanus Vaccine:    Vaccine Type: Td    Site: left deltoid    Mfr: GlaxoSmithKline    Dose: 0.5 ml    Route: IM    Given by: Robin Ewing    Exp. Date: 08/11/2011    Lot #: [id number]    VIS given: 08/14/07 version given November 18, 2009.

## 2010-10-26 NOTE — Assessment & Plan Note (Signed)
Summary: 6 MO ROV /NWS  #   Vital Signs:  Patient profile:   77 year old female Height:      66 inches Weight:      133.25 pounds BMI:     21.58 O2 Sat:      96 % on Room air Temp:     97.7 degrees F oral Pulse rate:   67 / minute BP sitting:   132 / 80  (left arm) Cuff size:   regular  Vitals Entered By: Zella Ball Ewing CMA Duncan Dull) (May 19, 2010 10:43 AM)  O2 Flow:  Room air CC: 6 month ROV/RE   Primary Care Provider:  Wendie Simmer  CC:  6 month ROV/RE.  History of Present Illness: here to f/u - s/p right hip buisits injection last wk per Dr Madelin Rear;  Pt denies CP, worsening sob, doe, wheezing, orthopnea, pnd, worsening LE edema, palps, dizziness or syncope  Pt denies new neuro symptoms such as headache, facial or extremity weakness  No fever, wt loss, night sweats, loss of appetite or other constitutional symptoms Overall good med compliacne, good med tolerability. Denies worsening hyper or hypothyroid symtpoms sucha s skin/voice change.  Trying to follow lower chol diet.  No change in dmentia behaviors such as paranoia or agitatio or hallucinosis  Problems Prior to Update: 1)  Preventive Health Care  (ICD-V70.0) 2)  Fatigue  (ICD-780.79) 3)  Pacemaker  (ICD-V45.Marland Kitchen01) 4)  Av Block, Complete  (ICD-426.0) 5)  Urinary Incontinence  (ICD-788.30) 6)  Aphasia  (ICD-784.3) 7)  Tia  (ICD-435.9) 8)  Cva  (ICD-434.91) 9)  Dementia  (ICD-294.8) 10)  Fatigue  (ICD-780.79) 11)  Diverticulosis, Colon  (ICD-562.10) 12)  Hypothyroidism  (ICD-244.9) 13)  Cerebrovascular Accident, Hx of  (ICD-V12.50) 14)  Hypertension  (ICD-401.9) 15)  Hyperlipidemia  (ICD-272.4)  Medications Prior to Update: 1)  Aggrenox 25-200 Mg Cp12 (Aspirin-Dipyridamole) .Marland Kitchen.. 1 By Mouth Two Times A Day 2)  Aricept 10 Mg Tabs (Donepezil Hcl) .... Take 1 Tablet By Mouth Once A Day 3)  Diovan 160 Mg Tabs (Valsartan) .Marland Kitchen.. 1 By Mouth Once Daily 4)  Foltx 2.5-25-2 Mg Tabs (Fa-Pyridoxine-Cyancobalamin) .Marland Kitchen.. 1 Tablet  By Mouth Once A Day 5)  Hydrochlorothiazide 12.5 Mg Caps (Hydrochlorothiazide) .Marland Kitchen.. 1 By Mouth Once Daily 6)  Lipitor 10 Mg Tabs (Atorvastatin Calcium) .Marland Kitchen.. 1 By Mouth Once Daily 7)  Norvasc 5 Mg Tabs (Amlodipine Besylate) .Marland Kitchen.. 1po Once Daily 8)  Synthroid 112 Mcg Tabs (Levothyroxine Sodium) .... Take 1 Tablet By Mouth Once A Day 9)  Caltrate 600+d 600-400 Mg-Unit Tabs (Calcium Carbonate-Vitamin D) .Marland Kitchen.. 1 By Mouth Once Daily 10)  Multivitamins   Tabs (Multiple Vitamin) .Marland Kitchen.. 1 By Mouth Once Daily  Current Medications (verified): 1)  Aggrenox 25-200 Mg Cp12 (Aspirin-Dipyridamole) .Marland Kitchen.. 1 By Mouth Two Times A Day 2)  Aricept 10 Mg Tabs (Donepezil Hcl) .... Take 1 Tablet By Mouth Once A Day 3)  Diovan 160 Mg Tabs (Valsartan) .Marland Kitchen.. 1 By Mouth Once Daily 4)  Foltx 2.5-25-2 Mg Tabs (Fa-Pyridoxine-Cyancobalamin) .Marland Kitchen.. 1 Tablet By Mouth Once A Day 5)  Hydrochlorothiazide 12.5 Mg Caps (Hydrochlorothiazide) .Marland Kitchen.. 1 By Mouth Once Daily 6)  Lipitor 10 Mg Tabs (Atorvastatin Calcium) .Marland Kitchen.. 1 By Mouth Once Daily 7)  Norvasc 5 Mg Tabs (Amlodipine Besylate) .Marland Kitchen.. 1po Once Daily 8)  Synthroid 112 Mcg Tabs (Levothyroxine Sodium) .... Take 1 Tablet By Mouth Once A Day 9)  Caltrate 600+d 600-400 Mg-Unit Tabs (Calcium Carbonate-Vitamin D) .Marland Kitchen.. 1 By Mouth Once Daily 10)  Multivitamins   Tabs (Multiple Vitamin) .Marland Kitchen.. 1 By Mouth Once Daily  Allergies (verified): 1)  ! Codeine  Directives: 1)  Do Not Resuscitate   Past History:  Past Medical History: Last updated: 01/09/2009  PACEMAKER (ICD-V45.Marland Kitchen01) AV BLOCK, COMPLETE (ICD-426.0) URINARY INCONTINENCE (ICD-788.30) APHASIA (ICD-784.3) TIA (ICD-435.9) CVA (ICD-434.91) DEMENTIA (ICD-294.8) FATIGUE (ICD-780.79) DIVERTICULOSIS, COLON (ICD-562.10) HYPOTHYROIDISM (ICD-244.9) CEREBROVASCULAR ACCIDENT, HX OF (ICD-V12.50) HYPERTENSION (ICD-401.9) HYPERLIPIDEMIA (ICD-272.4)    Past Surgical History: Last updated: 08/13/2009 Medtronic Cynthia dual-chamber  pacemaker.serial number was [id number] H....04/15/2009  Hysterectomy s/p cerebral aneurysm 1991 s/p pacemaker DDD Cholecystectomy s/p retinal re-attachment s/p thyroidectomy  Social History: Last updated: 05/19/2008 Divorced 2 son Never Smoked 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   Head:  normocephalic and atraumatic.   Eyes:  vision grossly intact, pupils equal, and pupils round.   Ears:  R ear normal and L ear normal.   Nose:  no external deformity and no nasal discharge.   Mouth:  no gingival abnormalities and pharynx pink and moist.   Neck:  supple and no masses.   Lungs:  normal respiratory effort and normal breath sounds.   Heart:  normal rate and regular rhythm.   Abdomen:  soft, non-tender, and normal bowel sounds.   Extremities:  no edema, no erythema  Neurologic:  remains pleasantly demented Psych:  good eye contact and not anxious appearing.     Impression & Recommendations:  Problem # 1:  HYPERTENSION (ICD-401.9)  Her updated medication list for this problem includes:    Diovan 160 Mg Tabs (Valsartan) .Marland Kitchen... 1 by mouth once daily    Hydrochlorothiazide 12.5 Mg Caps (Hydrochlorothiazide) .Marland Kitchen... 1 by mouth once daily    Norvasc 5 Mg Tabs (Amlodipine besylate) .Marland Kitchen... 1po once daily  BP today: 132/80 Prior BP: 110/60 (11/18/2009)  Labs Reviewed: K+: 3.6 (11/18/2009) Creat: : 1.0 (11/18/2009)   Chol: 145 (11/18/2009)   HDL: 56.70 (11/18/2009)   LDL: 66 (11/17/2008)   TG: 266.0 (11/18/2009) stable overall by hx and exam, ok to continue meds/tx as is   Problem # 2:  HYPERLIPIDEMIA (ICD-272.4)  Her updated medication list for this problem includes:    Lipitor 10 Mg Tabs (Atorvastatin calcium) .Marland Kitchen... 1 by mouth once daily  Labs Reviewed: SGOT: 24 (11/18/2009)   SGPT: 21  (11/18/2009)   HDL:56.70 (11/18/2009), 53.0 (11/17/2008)  LDL:66 (11/17/2008), 70 (11/20/2007)  Chol:145 (11/18/2009), 142 (11/17/2008)  Trig:266.0 (11/18/2009), 114 (11/17/2008) stable overall by hx and exam, ok to continue meds/tx as is   Problem # 3:  HYPOTHYROIDISM (ICD-244.9)  Her updated medication list for this problem includes:    Synthroid 112 Mcg Tabs (Levothyroxine sodium) .Marland Kitchen... Take 1 tablet by mouth once a day  Labs Reviewed: TSH: 1.83 (11/18/2009)    HgBA1c: 6.0 (11/18/2009) Chol: 145 (11/18/2009)   HDL: 56.70 (11/18/2009)   LDL: 66 (11/17/2008)   TG: 266.0 (11/18/2009) stable overall by hx and exam, ok to continue meds/tx as is   Problem # 4:  DEMENTIA (ICD-294.8) stable overall by hx and exam, ok to continue meds/tx as is , pt and famikly declines further tx   Complete Medication List: 1)  Aggrenox 25-200 Mg Cp12 (Aspirin-dipyridamole) .Marland Kitchen.. 1 by mouth two times a day 2)  Aricept 10 Mg Tabs (Donepezil hcl) .... Take 1 tablet by mouth  once a day 3)  Diovan 160 Mg Tabs (Valsartan) .Marland Kitchen.. 1 by mouth once daily 4)  Foltx 2.5-25-2 Mg Tabs (Fa-pyridoxine-cyancobalamin) .Marland Kitchen.. 1 tablet by mouth once a day 5)  Hydrochlorothiazide 12.5 Mg Caps (Hydrochlorothiazide) .Marland Kitchen.. 1 by mouth once daily 6)  Lipitor 10 Mg Tabs (Atorvastatin calcium) .Marland Kitchen.. 1 by mouth once daily 7)  Norvasc 5 Mg Tabs (Amlodipine besylate) .Marland Kitchen.. 1po once daily 8)  Synthroid 112 Mcg Tabs (Levothyroxine sodium) .... Take 1 tablet by mouth once a day 9)  Caltrate 600+d 600-400 Mg-unit Tabs (Calcium carbonate-vitamin d) .Marland Kitchen.. 1 by mouth once daily 10)  Multivitamins Tabs (Multiple vitamin) .Marland Kitchen.. 1 by mouth once daily  Patient Instructions: 1)  Continue all previous medications as before this visit 2)  Please schedule a follow-up appointment in 6 months for your "yearly medicare exam" Prescriptions: HYDROCHLOROTHIAZIDE 12.5 MG CAPS (HYDROCHLOROTHIAZIDE) 1 by mouth once daily  #30 x 11   Entered and Authorized by:   Corwin Levins MD   Signed by:   Corwin Levins MD on 05/19/2010   Method used:   Print then Give to Patient   RxID:   [id number] SYNTHROID 112 MCG TABS (LEVOTHYROXINE SODIUM) Take 1 tablet by mouth once a day  #30 x 11   Entered and Authorized by:   Corwin Levins MD   Signed by:   Corwin Levins MD on 05/19/2010   Method used:   Print then Give to Patient   RxID:   [id number] NORVASC 5 MG TABS (AMLODIPINE BESYLATE) 1po once daily  #30 x 11   Entered and Authorized by:   Corwin Levins MD   Signed by:   Corwin Levins MD on 05/19/2010   Method used:   Print then Give to Patient   RxID:   [id number] LIPITOR 10 MG TABS (ATORVASTATIN CALCIUM) 1 by mouth once daily  #30 x 11   Entered and Authorized by:   Corwin Levins MD   Signed by:   Corwin Levins MD on 05/19/2010   Method used:   Print then Give to Patient   RxID:   [id number] FOLTX 2.5-25-2 MG TABS (FA-PYRIDOXINE-CYANCOBALAMIN) 1 tablet by mouth once a day  #30 x 11   Entered and Authorized by:   Corwin Levins MD   Signed by:   Corwin Levins MD on 05/19/2010   Method used:   Print then Give to Patient   RxID:   [id number] DIOVAN 160 MG TABS (VALSARTAN) 1 by mouth once daily  #30 x 11   Entered and Authorized by:   Corwin Levins MD   Signed by:   Corwin Levins MD on 05/19/2010   Method used:   Print then Give to Patient   RxID:   [id number] ARICEPT 10 MG TABS (DONEPEZIL HCL) Take 1 tablet by mouth once a day  #30 x 11   Entered and Authorized by:   Corwin Levins MD   Signed by:   Corwin Levins MD on 05/19/2010   Method used:   Print then Give to Patient   RxID:   [id number] AGGRENOX 25-200 MG CP12 (ASPIRIN-DIPYRIDAMOLE) 1 by mouth two times a day  #60 x 11   Entered and Authorized by:   Corwin Levins MD   Signed by:   Corwin Levins MD on 05/19/2010   Method used:   Print then Give to Patient   RxID:   [id number]

## 2010-11-15 ENCOUNTER — Encounter (INDEPENDENT_AMBULATORY_CARE_PROVIDER_SITE_OTHER): Payer: Self-pay | Admitting: *Deleted

## 2010-11-23 NOTE — Letter (Signed)
Summary: [employer] Phone Journalist, newspaper, Main Office  [address]   [place], [place] [address]   Phone: [phone]  Fax: [phone]     November 15, 2010 MRN: [id number]   KIYARA BOUFFARD [address] [place], [place]  [address]   Dear Ms. Rothermel,  According to our records, you were scheduled for a device phone transmission on 11-11-2010.     We did not receive any results from this check.  If you transmitted on your scheduled day, please call us to help troubleshoot your system.  If you forgot to send your transmission, please send one upon receipt of this letter.  Thank you,   [facility]

## 2010-12-06 ENCOUNTER — Other Ambulatory Visit: Payer: Self-pay | Admitting: Internal Medicine

## 2010-12-06 ENCOUNTER — Other Ambulatory Visit: Payer: Medicare Other

## 2010-12-06 ENCOUNTER — Encounter: Payer: Self-pay | Admitting: Internal Medicine

## 2010-12-06 ENCOUNTER — Encounter (INDEPENDENT_AMBULATORY_CARE_PROVIDER_SITE_OTHER): Payer: Medicare Other | Admitting: Internal Medicine

## 2010-12-06 DIAGNOSIS — I10 Essential (primary) hypertension: Secondary | ICD-10-CM

## 2010-12-06 DIAGNOSIS — R5381 Other malaise: Secondary | ICD-10-CM

## 2010-12-06 DIAGNOSIS — R32 Unspecified urinary incontinence: Secondary | ICD-10-CM

## 2010-12-06 DIAGNOSIS — D509 Iron deficiency anemia, unspecified: Secondary | ICD-10-CM

## 2010-12-06 DIAGNOSIS — E739 Lactose intolerance, unspecified: Secondary | ICD-10-CM

## 2010-12-06 DIAGNOSIS — R5383 Other fatigue: Secondary | ICD-10-CM

## 2010-12-06 DIAGNOSIS — E785 Hyperlipidemia, unspecified: Secondary | ICD-10-CM

## 2010-12-06 DIAGNOSIS — Z23 Encounter for immunization: Secondary | ICD-10-CM

## 2010-12-06 HISTORY — DX: Lactose intolerance, unspecified: E73.9

## 2010-12-06 HISTORY — DX: Iron deficiency anemia, unspecified: D50.9

## 2010-12-06 LAB — URINALYSIS, ROUTINE W REFLEX MICROSCOPIC
Bilirubin Urine: NEGATIVE
Nitrite: NEGATIVE
Total Protein, Urine: 30
pH: 6 (ref 5.0–8.0)

## 2010-12-06 LAB — IBC PANEL
Saturation Ratios: 14.8 % — ABNORMAL LOW (ref 20.0–50.0)
Transferrin: 289.2 mg/dL (ref 212.0–360.0)

## 2010-12-06 LAB — CBC WITH DIFFERENTIAL/PLATELET
Basophils Relative: 0.5 % (ref 0.0–3.0)
HCT: 37.9 % (ref 36.0–46.0)
Lymphocytes Relative: 25.2 % (ref 12.0–46.0)
Lymphs Abs: 2 10*3/uL (ref 0.7–4.0)
MCV: 94 fl (ref 78.0–100.0)
Monocytes Absolute: 0.7 10*3/uL (ref 0.1–1.0)
Monocytes Relative: 8.4 % (ref 3.0–12.0)
Neutro Abs: 5.1 10*3/uL (ref 1.4–7.7)
Neutrophils Relative %: 63.1 % (ref 43.0–77.0)
Platelets: 270 10*3/uL (ref 150.0–400.0)

## 2010-12-06 LAB — TSH: TSH: 0.73 u[IU]/mL (ref 0.35–5.50)

## 2010-12-06 LAB — BASIC METABOLIC PANEL
BUN: 25 mg/dL — ABNORMAL HIGH (ref 6–23)
CO2: 27 mEq/L (ref 19–32)
Glucose, Bld: 95 mg/dL (ref 70–99)
Sodium: 138 mEq/L (ref 135–145)

## 2010-12-06 LAB — HEPATIC FUNCTION PANEL: ALT: 21 U/L (ref 0–35)

## 2010-12-06 LAB — HEMOGLOBIN A1C: Hgb A1c MFr Bld: 5.9 % (ref 4.6–6.5)

## 2010-12-06 LAB — LIPID PANEL
HDL: 63.1 mg/dL (ref >39.00)
Total CHOL/HDL Ratio: 2
Triglycerides: 74 mg/dL (ref 0.0–149.0)

## 2010-12-07 ENCOUNTER — Encounter: Payer: Self-pay | Admitting: Internal Medicine

## 2010-12-14 NOTE — Assessment & Plan Note (Signed)
Summary: YEARLY MEDICARE FU LABS AFTER NWS #   Vital Signs:  Patient profile:   77 year old female Height:      66 inches Weight:      124.50 pounds BMI:     20.17 O2 Sat:      98 % on Room air Temp:     97.9 degrees F oral Pulse rate:   66 / minute BP sitting:   120 / 70  (left arm) Cuff size:   regular  Vitals Entered By: Zella Ball Ewing CMA Duncan Dull) (December 06, 2010 9:56 AM)  O2 Flow:  Room air  CC: Yearly/RE   Primary Care Provider:  Wendie Simmer  CC:  Yearly/RE.  History of Present Illness: here with granddaughter for general f/u; pt has some fatigue it seems but Pt denies CP, worsening sob, doe, wheezing, orthopnea, pnd, worsening LE edema, palps, dizziness or syncope   Pt denies new neuro symptoms such as headache, facial or extremity weakness  Pt denies polydipsia, polyuria  Overall good compliance with meds, trying to follow low chol diet, wt stable, little excercise however  Denies worsening depressive symptoms, suicidal ideation, or panic.   No fever, wt loss, night sweats, loss of appetite or other constitutional symptoms  Specificially denies Gu symtpoms such as dysuria, freq, urgency, hematuria or back pain.  Has had some pain to the right hip last yr now s/p cortisone with pain resolved per ortho.  No worseinng behavioral symtpoms such as paranoia, wandering, hallucinations, agitation.  Currently still lives in Independent living, and follow closely per family with meds..  Pt states good ability with ADL's, low fall risk, home safety reviewed and adequate, no significant change in hearing or vision, trying to follow lower chol diet, and occasionally active only with regular excercise - pt states she walks the dog, but family relates this means about 50 ft out and back to the apartment.  Family states she was told per ortho she is getting quads weakness and recdommended to increased daily excercise with longer distances - she could do this by walking around the building she lives  in.  Preventive Screening-Counseling & Management      Drug Use:  no.    Problems Prior to Update: 1)  Glucose Intolerance  (ICD-271.3) 2)  Iron Deficiency  (ICD-280.9) 3)  Preventive Health Care  (ICD-V70.0) 4)  Fatigue  (ICD-780.79) 5)  Pacemaker, Permanent  (ICD-V45.01) 6)  Av Block, Complete  (ICD-426.0) 7)  Urinary Incontinence  (ICD-788.30) 8)  Aphasia  (ICD-784.3) 9)  Tia  (ICD-435.9) 10)  Cva  (ICD-434.91) 11)  Dementia  (ICD-294.8) 12)  Fatigue  (ICD-780.79) 13)  Diverticulosis, Colon  (ICD-562.10) 14)  Hypothyroidism  (ICD-244.9) 15)  Cerebrovascular Accident, Hx of  (ICD-V12.50) 16)  Hypertension  (ICD-401.9) 17)  Hyperlipidemia  (ICD-272.4)  Medications Prior to Update: 1)  Aggrenox 25-200 Mg Cp12 (Aspirin-Dipyridamole) .Marland Kitchen.. 1 By Mouth Two Times A Day 2)  Aricept 10 Mg Tabs (Donepezil Hcl) .... Take 1 Tablet By Mouth Once A Day 3)  Diovan 160 Mg Tabs (Valsartan) .Marland Kitchen.. 1 By Mouth Once Daily 4)  Foltx 2.5-25-2 Mg Tabs (Fa-Pyridoxine-Cyancobalamin) .Marland Kitchen.. 1 Tablet By Mouth Once A Day 5)  Hydrochlorothiazide 12.5 Mg Caps (Hydrochlorothiazide) .Marland Kitchen.. 1 By Mouth Once Daily 6)  Lipitor 10 Mg Tabs (Atorvastatin Calcium) .Marland Kitchen.. 1 By Mouth Once Daily 7)  Norvasc 5 Mg Tabs (Amlodipine Besylate) .Marland Kitchen.. 1po Once Daily 8)  Synthroid 112 Mcg Tabs (Levothyroxine Sodium) .... Take 1 Tablet By Mouth  Once A Day 9)  Caltrate 600+d 600-400 Mg-Unit Tabs (Calcium Carbonate-Vitamin D) .Marland Kitchen.. 1 By Mouth Once Daily 10)  Multivitamins   Tabs (Multiple Vitamin) .Marland Kitchen.. 1 By Mouth Once Daily  Current Medications (verified): 1)  Aggrenox 25-200 Mg Cp12 (Aspirin-Dipyridamole) .Marland Kitchen.. 1 By Mouth Two Times A Day 2)  Aricept 10 Mg Tabs (Donepezil Hcl) .... Take 1 Tablet By Mouth Once A Day 3)  Diovan 160 Mg Tabs (Valsartan) .Marland Kitchen.. 1 By Mouth Once Daily 4)  Foltx 2.5-25-2 Mg Tabs (Fa-Pyridoxine-Cyancobalamin) .Marland Kitchen.. 1 Tablet By Mouth Once A Day 5)  Hydrochlorothiazide 12.5 Mg Caps (Hydrochlorothiazide) .Marland Kitchen.. 1 By Mouth  Once Daily 6)  Lipitor 10 Mg Tabs (Atorvastatin Calcium) .Marland Kitchen.. 1 By Mouth Once Daily 7)  Norvasc 5 Mg Tabs (Amlodipine Besylate) .Marland Kitchen.. 1po Once Daily 8)  Synthroid 112 Mcg Tabs (Levothyroxine Sodium) .... Take 1 Tablet By Mouth Once A Day 9)  Caltrate 600+d 600-400 Mg-Unit Tabs (Calcium Carbonate-Vitamin D) .Marland Kitchen.. 1 By Mouth Once Daily 10)  Multivitamins   Tabs (Multiple Vitamin) .Marland Kitchen.. 1 By Mouth Once Daily  Allergies (verified): 1)  ! Codeine  Directives: 1)  Do Not Resuscitate   Past History:  Past Medical History: Last updated: 01/09/2009  PACEMAKER (ICD-V45.Marland Kitchen01) AV BLOCK, COMPLETE (ICD-426.0) URINARY INCONTINENCE (ICD-788.30) APHASIA (ICD-784.3) TIA (ICD-435.9) CVA (ICD-434.91) DEMENTIA (ICD-294.8) FATIGUE (ICD-780.79) DIVERTICULOSIS, COLON (ICD-562.10) HYPOTHYROIDISM (ICD-244.9) CEREBROVASCULAR ACCIDENT, HX OF (ICD-V12.50) HYPERTENSION (ICD-401.9) HYPERLIPIDEMIA (ICD-272.4)    Past Surgical History: Last updated: 08/13/2009 Medtronic Cynthia dual-chamber pacemaker.serial number was [id number]....04/15/2009  Hysterectomy s/p cerebral aneurysm 1991 s/p pacemaker DDD Cholecystectomy s/p retinal re-attachment s/p thyroidectomy  Social History: Last updated: 12/06/2010 Divorced 2 son Never Smoked 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   Head:  normocephalic and atraumatic.   Eyes:  vision grossly intact, pupils equal, and pupils round.   Ears:  R ear normal and L ear normal.   Nose:  no external deformity and no nasal discharge.   Mouth:  no gingival abnormalities  and pharynx pink and moist.   Neck:  supple and no masses.   Lungs:  normal respiratory effort and normal breath sounds.   Heart:  normal rate and regular rhythm.   Abdomen:  soft, non-tender, and normal bowel sounds.   Msk:  no flank tender bilat, no right hip tender laterally Extremities:  no edema, no erythema  Neurologic:  strength normal in all extremities and gait normal.     Impression & Recommendations:  Problem # 1:  FATIGUE (ICD-780.79) exam benign, to check labs below; follow with expectant management  Orders: TLB-BMP (Basic Metabolic Panel-BMET) (80048-METABOL) TLB-CBC Platelet - w/Differential (85025-CBCD) TLB-Hepatic/Liver Function Pnl (80076-HEPATIC) TLB-TSH (Thyroid Stimulating Hormone) (84443-TSH)  Problem # 2:  IRON DEFICIENCY (ICD-280.9)  asymtp - stable overall by hx and exam, ok to continue meds/tx as is , to check labs Orders: TLB-IBC Pnl (Iron/FE;Transferrin) (83550-IBC)  Hgb: 12.5 (11/18/2009)   Hct: 37.3 (11/18/2009)   Platelets: 303.0 (11/18/2009) RBC: 3.93 (11/18/2009)   RDW: 13.0 (11/18/2009)   WBC: 8.2 (11/18/2009) MCV: 95.1 (11/18/2009)   MCHC: 33.4 (11/18/2009) Iron: 37 (11/18/2009)   %  Sat: 8.8 (11/18/2009) B12: >1500 pg/mL (11/18/2009)   Folate: >20.0 ng/mL (11/18/2009)   TSH: 1.83 (11/18/2009)  Problem # 3:  HYPERTENSION (ICD-401.9)  Her updated medication list for this problem includes:    Diovan 160 Mg Tabs (Valsartan) .Marland Kitchen... 1 by mouth once daily    Hydrochlorothiazide 12.5 Mg Caps (Hydrochlorothiazide) .Marland Kitchen... 1 by mouth once daily    Norvasc 5 Mg Tabs (Amlodipine besylate) .Marland Kitchen... 1po once daily  BP today: 120/70 Prior BP: 122/60 (08/10/2010)  Labs Reviewed: K+: 3.6 (11/18/2009) Creat: : 1.0 (11/18/2009)   Chol: 145 (11/18/2009)   HDL: 56.70 (11/18/2009)   LDL: 66 (11/17/2008)   TG: 266.0 (11/18/2009) stable overall by hx and exam, ok to continue meds/tx as is   Problem # 4:  HYPERLIPIDEMIA (ICD-272.4)  Her updated medication list for  this problem includes:    Lipitor 10 Mg Tabs (Atorvastatin calcium) .Marland Kitchen... 1 by mouth once daily  Orders: TLB-Lipid Panel (80061-LIPID)  Labs Reviewed: SGOT: 24 (11/18/2009)   SGPT: 21 (11/18/2009)   HDL:56.70 (11/18/2009), 53.0 (11/17/2008)  LDL:66 (11/17/2008), 70 (11/20/2007)  Chol:145 (11/18/2009), 142 (11/17/2008)  Trig:266.0 (11/18/2009), 114 (11/17/2008) stable overall by hx and exam, ok to continue meds/tx as is , Pt to continue diet efforts, good med tolerance; to check labs - goal LDL less than 100   Complete Medication List: 1)  Aggrenox 25-200 Mg Cp12 (Aspirin-dipyridamole) .Marland Kitchen.. 1 by mouth two times a day 2)  Aricept 10 Mg Tabs (Donepezil hcl) .... Take 1 tablet by mouth once a day 3)  Diovan 160 Mg Tabs (Valsartan) .Marland Kitchen.. 1 by mouth once daily 4)  Foltx 2.5-25-2 Mg Tabs (Fa-pyridoxine-cyancobalamin) .Marland Kitchen.. 1 tablet by mouth once a day 5)  Hydrochlorothiazide 12.5 Mg Caps (Hydrochlorothiazide) .Marland Kitchen.. 1 by mouth once daily 6)  Lipitor 10 Mg Tabs (Atorvastatin calcium) .Marland Kitchen.. 1 by mouth once daily 7)  Norvasc 5 Mg Tabs (Amlodipine besylate) .Marland Kitchen.. 1po once daily 8)  Synthroid 112 Mcg Tabs (Levothyroxine sodium) .... Take 1 tablet by mouth once a day 9)  Caltrate 600+d 600-400 Mg-unit Tabs (Calcium carbonate-vitamin d) .Marland Kitchen.. 1 by mouth once daily 10)  Multivitamins Tabs (Multiple vitamin) .Marland Kitchen.. 1 by mouth once daily  Other Orders: T-Culture, Urine (13086-57846) Pneumococcal Vaccine (96295) Admin 1st Vaccine (28413) TLB-Udip w/ Micro (81001-URINE) TLB-A1C / Hgb A1C (Glycohemoglobin) (83036-A1C)  Patient Instructions: 1)  you had the pneumonia shot today 2)  Please consider the shingles shot;  it is approx $287 cashpay, and you can call for nurse visit for the shot at any time if you want to have the shot done 3)  Please go to the Lab in the basement for your blood and/or urine tests today 4)  Please call the number on the [facility] Card for results of your testing  5)  You are given all the  refills today 6)  Please schedule a follow-up appointment in 6 months. Prescriptions: SYNTHROID 112 MCG TABS (LEVOTHYROXINE SODIUM) Take 1 tablet by mouth once a day  #30 x 11   Entered and Authorized by:   Corwin Levins MD   Signed by:   Corwin Levins MD on 12/06/2010   Method used:   Print then Give to Patient   RxID:   [id number] NORVASC 5 MG TABS (AMLODIPINE BESYLATE) 1po once daily  #30 x 11   Entered and Authorized by:   Corwin Levins MD   Signed by:   Corwin Levins MD on 12/06/2010   Method used:   Print then  Give to Patient   RxID:   [id number] LIPITOR 10 MG TABS (ATORVASTATIN CALCIUM) 1 by mouth once daily  #30 x 11   Entered and Authorized by:   Corwin Levins MD   Signed by:   Corwin Levins MD on 12/06/2010   Method used:   Print then Give to Patient   RxID:   [id number] HYDROCHLOROTHIAZIDE 12.5 MG CAPS (HYDROCHLOROTHIAZIDE) 1 by mouth once daily  #30 x 11   Entered and Authorized by:   Corwin Levins MD   Signed by:   Corwin Levins MD on 12/06/2010   Method used:   Print then Give to Patient   RxID:   [id number] FOLTX 2.5-25-2 MG TABS (FA-PYRIDOXINE-CYANCOBALAMIN) 1 tablet by mouth once a day  #30 x 11   Entered and Authorized by:   Corwin Levins MD   Signed by:   Corwin Levins MD on 12/06/2010   Method used:   Print then Give to Patient   RxID:   [id number] DIOVAN 160 MG TABS (VALSARTAN) 1 by mouth once daily  #30 x 11   Entered and Authorized by:   Corwin Levins MD   Signed by:   Corwin Levins MD on 12/06/2010   Method used:   Print then Give to Patient   RxID:   [id number] ARICEPT 10 MG TABS (DONEPEZIL HCL) Take 1 tablet by mouth once a day  #30 x 11   Entered and Authorized by:   Corwin Levins MD   Signed by:   Corwin Levins MD on 12/06/2010   Method used:   Print then Give to Patient   RxID:   [id number] AGGRENOX 25-200 MG CP12 (ASPIRIN-DIPYRIDAMOLE) 1 by mouth two times a day  #60 x 11   Entered and Authorized by:   Corwin Levins MD    Signed by:   Corwin Levins MD on 12/06/2010   Method used:   Print then Give to Patient   RxID:   [id number]    Orders Added: 1)  T-Culture, Urine [16073-71062] 2)  Pneumococcal Vaccine [69485] 3)  Admin 1st Vaccine [90471] 4)  TLB-BMP (Basic Metabolic Panel-BMET) [80048-METABOL] 5)  TLB-CBC Platelet - w/Differential [85025-CBCD] 6)  TLB-Hepatic/Liver Function Pnl [80076-HEPATIC] 7)  TLB-TSH (Thyroid Stimulating Hormone) [84443-TSH] 8)  TLB-IBC Pnl (Iron/FE;Transferrin) [83550-IBC] 9)  TLB-Lipid Panel [80061-LIPID] 10)  TLB-Udip w/ Micro [81001-URINE] 11)  TLB-A1C / Hgb A1C (Glycohemoglobin) [83036-A1C] 12)  Est. Patient Level IV [46270]   Immunizations Administered:  Pneumonia Vaccine:    Vaccine Type: Pneumovax    Site: left deltoid    Mfr: Merck    Dose: 0.5 ml    Route: IM    Given by: Zella Ball Ewing CMA (AAMA)    Exp. Date: 02/18/2012    Lot #: [id number]    VIS given: 08/31/09 version given December 06, 2010.   Immunizations Administered:  Pneumonia Vaccine:    Vaccine Type: Pneumovax    Site: left deltoid    Mfr: Merck    Dose: 0.5 ml    Route: IM    Given by: Zella Ball Ewing CMA (AAMA)    Exp. Date: 02/18/2012    Lot #: [id number]    VIS given: 08/31/09 version given December 06, 2010.

## 2011-01-02 LAB — BASIC METABOLIC PANEL
BUN: 22 mg/dL (ref 6–23)
Calcium: 9.9 mg/dL (ref 8.4–10.5)
GFR calc Af Amer: >60 mL/min (ref >60)
GFR calc non Af Amer: 56 mL/min — ABNORMAL LOW (ref >60)
Potassium: 3.8 mEq/L (ref 3.5–5.1)
Sodium: 142 mEq/L (ref 135–145)

## 2011-01-02 LAB — PROTIME-INR: INR: 1 (ref 0.00–1.49)

## 2011-01-02 LAB — CBC
HCT: 34.6 % — ABNORMAL LOW (ref 36.0–46.0)
MCHC: 33.8 g/dL (ref 30.0–36.0)
Platelets: 285 10*3/uL (ref 150–400)
WBC: 8 10*3/uL (ref 4.0–10.5)

## 2011-01-02 LAB — APTT: aPTT: 30 seconds (ref 24–37)

## 2011-02-08 NOTE — Op Note (Signed)
[name], [name]            ACCOUNT NO.:  [id number]   MEDICAL RECORD NO.:  [id number]          PATIENT TYPE:  OIB   LOCATION:  2899                         FACILITY:  [facility]   PHYSICIAN:  Doylene Canning. Ladona Ridgel, MD    DATE OF BIRTH:  [birth year]   DATE OF PROCEDURE:  04/15/2009  DATE OF DISCHARGE:                               OPERATIVE REPORT   PROCEDURE PERFORMED:  Removal of previous implanted dual-chamber  pacemaker, which had reached elective replacement and insertion of a new  dual-chamber device.   INDICATIONS:  Symptomatic bradycardia with complete heart block.   INTRODUCTION:  The patient is an 86-year woman who has a history of  complete heart block who underwent initial pacemaker insertion years  ago.  Her pacemaker had reached elective replacement indication 8 years  after implant, and she is now referred for removal of her old device and  insertion of a new one.   PROCEDURE:  After informed consent was obtained, the patient was taken  to the diagnostic EP lab in a fasting state.  After the usual  preparation and draping, intravenous fentanyl and midazolam was given  for sedation, 30 mL of lidocaine was infiltrated into the left  infraclavicular region.  A 5-cm incision was carried out just caudal to  the old pacemaker incision and electrocautery was utilized to dissect  down to the fascial plane.  The electrocautery was then utilized to  enter the pocket and the fibrous adhesions were freed up with  electrocautery.  The old Medtronic kappa dual-chamber pacemaker was  removed and the atrial lead was disconnected from the old generator.  The P-waves measured to the impedance was 550 ohms and threshold 0.4  volts at 0.5 milliseconds.  With these satisfactory parameters,  attention was then turned to the ventricular lead.  It was removed from  the pocket.  There were initially no escape beats.  The alligator clips  were hooked up to the device and the pacing threshold was  0.4 volts at  0.5 milliseconds.  The impedance was 445 ohms and stable.  At 30 volts,  no intrinsic R-waves were noted.  Having demonstrated this, the new  Medtronic Cynthia dual-chamber pacemaker was connected to the  ventricular lead.  The pacemaker serial number was [id number].  Having  accomplished this, the pocket was irrigated with gentamicin irrigation  and the device was placed back in the subcutaneous pocket.  After  additional gentamicin, irrigation was infused into the pocket.  The  incision was closed with 2-0 and 3-0 Vicryl.  Benzoin was painted on  skin.  Steri-Strips were applied and pressure dressing placed and the  patient was returned to her room in satisfactory condition.   COMPLICATIONS:  There were no immediate procedure complications.   RESULTS:  This demonstrated successful removal of a previously implanted  dual-chamber pacemaker, which had reached elective replacement  indication followed by insertion of a new device in a patient with  complete heart block and no escape.      Doylene Canning. Ladona Ridgel, MD  Electronically Signed     Doylene Canning. Ladona Ridgel, MD  Electronically  Signed    GWT/MEDQ  D:  04/15/2009  T:  04/16/2009  Job:  [id number]

## 2011-02-08 NOTE — Assessment & Plan Note (Signed)
[facility]                         ELECTROPHYSIOLOGY OFFICE NOTE   NAME:[name], [name]                   MRN:          [id number]  DATE:12/12/2007                            DOB:          03/31/1922    HISTORY:  [name] returns today for followup.  She is a very  pleasant elderly woman with a history of symptomatic bradycardia and  complete heart block who is status post pacemaker insertion.  She also  has hypertension and dyslipidemia.  She has mild dementia.  The patient  returns today for followup.  She had no specific complaints today.   MEDICATIONS:  1. Aggrenox 200 twice a day.  2. Lipitor 10 a day.  3. Synthroid 112 mic daily.  4. HCTZ 12.5 daily.  5. Diovan 160 a day.  6. Norvasc 5 a day.  7. Aricept 10 a day.  8. Caltrate.  9. Multivitamins.   PHYSICAL EXAMINATION:  GENERAL:  She is a pleasant elderly woman in no  distress.  VITAL SIGNS:  Blood pressure was 131/70, pulse 69 and regular,  respirations were 18.  Weight was 137 pounds.  NECK:  Revealed no jugular venous distention.  There is no thyromegaly.  LUNGS:  Clear bilaterally to auscultation.  No wheezes, rales or rhonchi  are present.  CARDIOVASCULAR:  Regular rate and rhythm.  Normal S1-S2.  ABDOMINAL:  Soft, nontender.  There is no organomegaly.  EXTREMITIES:  Demonstrated no edema.   PROCEDURE:  Interrogation of her pacemaker demonstrates a Medtronic  Kappa 800.  The P-waves were 2,  the R-waves 8, the impedance 676 in the  A and 656 in the RV.  Threshold is 0.5 to 0.4 in the atrium and 0.75 to  0.4 in the RV.  The battery voltage was 2.70 volts.  She has estimated  longevity on her device of approximately 1-1/2 years.   IMPRESSION:  1. Intermittent complete heart block.  2. Symptomatic bradycardia.  3. Hypertension.  4. Dyslipidemia.   DISCUSSION:  Overall, [name] is stable and her pacemaker is  working normally.  She is not yet at elective  replacement indication.  We will plan on checking her pacemaker back in  followup in the office in 1 year and to our [facility] in 3  months.     Doylene Canning. Ladona Ridgel, MD  Electronically Signed    GWT/MedQ  DD: 12/12/2007  DT: 12/12/2007  Job #: [id number]

## 2011-02-08 NOTE — Discharge Summary (Signed)
[name], [name]            ACCOUNT NO.:  [id number]   MEDICAL RECORD NO.:  [id number]          PATIENT TYPE:  OIB   LOCATION:  2899                         FACILITY:  [facility]   PHYSICIAN:  Doylene Canning. Ladona Ridgel, MD    DATE OF BIRTH:  [birth year]   DATE OF ADMISSION:  04/15/2009  DATE OF DISCHARGE:  04/15/2009                               DISCHARGE SUMMARY   ALLERGIES:  This patient has allergy to CODEINE.   TIME FOR THIS DICTATION:  Greater than 30 minutes.   FINAL DIAGNOSES:  1. Pacemaker implanted for complete heart block, is now at elective      replacement indicator.  2. The patient discharging status post explantation of existing      Medtronic Kappa device with implant of the Medtronic Sensia dual-      chamber pacemaker by Dr. Lewayne Bunting.   SECONDARY DIAGNOSES:  1. Complete heart block.  2. Urinary incontinence.  3. History of cerebrovascular accident, transient ischemic attacks.  4. Dementia.  5. Colonic diverticulosis.  6. Hypothyroidism.  7. Hypertension.  8. Dyslipidemia.  9. The patient has also had hysterectomy, cholecystectomy,      thyroidectomy.  10.History of cerebral aneurysm in 1991.   PROCEDURE:  On April 15, 2009, explantation of existing permanent  pacemaker with implantation of the Medtronic Sensia dual-chamber  pacemaker by Dr. Lewayne Bunting.  The patient tolerated the procedure  well, did not require high degree of anesthesia.  The patient is alert  and oriented at discharge.  Discussion of incision care for the next 7  days to keep it dry until Wednesday, April 22, 2009.  She is asked to  remove the bulky bandage in the morning of Thursday, April 16, 2009, and  leave the incision open to the air.   The patient goes home with a new medication, antibiotic Keflex 250 mg 1  tablet every 8 hours as needed for the next 5 days.   She follows up at [facility], Dr. Lubertha Basque office [facility] on  Wednesday, April 29, 2009, at 4:30.   She  continues her medications that she has been taking at home and they  are as follows:  1. Aggrenox 200 mg 2 tablets daily.  2. Lipitor 10 mg daily at bedtime.  3. Synthroid 112 mcg daily.  4. Hydrochlorothiazide 12.5 mg daily.  5. Diovan 160 mg daily.  6. Norvasc 5 mg daily.  7. Aricept 10 mg daily.  8. Foltx 1 tablet daily.      Maple Mirza, PA      Doylene Canning. Ladona Ridgel, MD  Electronically Signed    GM/MEDQ  D:  04/15/2009  T:  04/16/2009  Job:  [id number]   cc:   Corwin Levins, MD  Pramod P. Pearlean Brownie, MD

## 2011-02-11 NOTE — H&P (Signed)
NAMEMARILI, [name]                        ACCOUNT NO.:  [id number]   MEDICAL RECORD NO.:  [id number]                   PATIENT TYPE:  INP   LOCATION:  3002                                 FACILITY:  [facility]   PHYSICIAN:  Casimiro Needle L. Thad Ranger, M.D.           DATE OF BIRTH:  [birth year]   DATE OF ADMISSION:  05/12/2003  DATE OF DISCHARGE:                                HISTORY & PHYSICAL   CHIEF COMPLAINT:  Right-sided weakness.   HISTORY OF PRESENT ILLNESS:  This is the initial [facility]  admission for this 77 year old woman with past medical history including  previous right brain stroke and left hemiparesis as well as pacemaker  placement.  The patient was in her usual state of really good health until  she woke up this morning at approximately 4 a.m. and felt that she was weak  and numb on the right side.  She did manage to get to the bathroom and back,  but said that this was very difficult for her.  She subsequently got in  touch with her sister who alerted her son who found her and noted that she  was very weak on the right side and brought her to the emergency room for  further evaluation.  Her symptoms have improved since she arrived in the  emergency room.  She denies having any recent symptoms like this.  She  denies any associated headache, chest pain, palpitations, shortness of  breath or nausea. 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   PAST MEDICAL HISTORY:  She suffered a cerebral artery aneurysm rupture in  1992 with subsequent clipping and associated vasospasm which resulted in a  sizeable left brain stroke. She has residual aphasia secondary to this, but  otherwise has made an excellent recovery.  Also remarkable for pacemaker  placement for previous complete heart block.  She had a significant goiter  with subsequent  thyroidectomy at age 73; and has been on thyroid replacement  since.   FAMILY HISTORY:  Noncontributory.   SOCIAL HISTORY:  She recently moved from [place] to this area where her  son resides. She has been fairly independent to this point taking care of  most of her usual activities of daily living.  She does have significant  difficulty reading due to her stroke.   MEDICATIONS:  1. Diovan 160 mg daily.  2. Norvasc 5 mg daily.  3. Synthroid 0.125 mg daily.  4. Lipitor 10 mg daily.  5. She says that she is supposed to take aspirin, but her son reports that     she has not been taking it regularly.   ALLERGIES:  CODEINE.   REVIEW OF SYSTEMS:  The full intense review of systems is negative except as  outlined in the HPI and admission nursing  record.  There is a little  limitation in this due to her aphasia.   PHYSICAL EXAMINATION:  VITAL SIGNS:  Temperature 97.3, blood pressure  200/94, pulse 70, respirations 20.  O2 saturation 96% on 2 liters nasal  cannula.  GENERAL:  This is a relatively healthy-appearing female in no evident  distress.  HEENT:  Head is normocephalic, atraumatic.  Oropharynx is benign.  NECK:  Supple without carotid bruits.  HEART:  Regular rate and rhythm without murmurs.  CHEST: Clear to auscultation bilaterally.  ABDOMEN:  Soft, nontender,  Normoactive bowel sounds.  No  hepatosplenomegaly.  EXTREMITIES:  No edema, 2+ peripheral pulses.  NEUROLOGIC: Mental status -- she is awake, alert, and fully oriented.  She  is able to name objects. She has considerable difficulty repeating phrases.  Her speech is nonfluent, but appropriate in content.  Attention span,  concentration, and fund of knowledge all seem adequate.  Mood is euthymic  and affect is appropriate.  Cranial nerves and funduscopic exam is benign.  Pupils are equal and briskly reactive.  Extraocular movements are full  without nystagmus.  Examination of the visual fields reveals a partial  right  lower quadrant hemianopia which the son reports as old.  Hearing is intact  to finger rub and conversational speech.  Face, tongue and palate move  normally and symmetrically.  Shoulder shrug strength is normal.  Motor  testing: Normal bulk and tone.  She is able to generate almost full strength  in the right upper and lower extremity, but does seem to have some  impersistence with motor testing.  Sensation intact to pinprick and double  simultaneous stimulation in all extremities.  Coordination and rapid  movements are slow on the right and she has a bit of an apraxia.  Finger-to-  nose can be performed on the right with difficulty, more smoothly on the  left.  Gait is deferred.  Reflexes are 2+ and symmetric.  Toes are  downgoing.   LABORATORY REVIEW:  CBC is unremarkable.  BMET is unremarkable.  Cardiac  enzymes remarkable for a minimally elevated troponin of 0.05 with a normal  CK and CK/MB.  Liver functions are unremarkable.  Urinalysis is  unremarkable.  CT of the head is personally reviewed and demonstrates a  large area of encephalomalacia comprising most of the left MCA territory  particularly anteriorly with a little bit of subcortical white matter  disease.  There does not appear to be any acute finding.  EKG demonstrates  paced rhythm with what appears to be a nonspecific interventricular  conduction delay, but no definite acute abnormalities.   IMPRESSION:  1. Presumed left brain stroke with improving right hemiparesis and     subjective sensory change.  This is presumably a vascular event. Another     less likely possibility would be a postictal phenomenon due to an     unwitnessed seizure, but she does have definite vascular risk factors and     has not been on any platelet therapy and so this is considered more     likely.  2. History of a previous left brain stroke with nonfluent aphasia. 3. History of a subarachnoid hemorrhage with left brain aneurysm  clipping.  4. History of a heart block status post pacemaker placement.   PLAN:  Will admit to stroke unit for close observation.  Will proceed with  routine stroke workup including carotid and transcranial Dopplers, stroke  labs and transthoracic echo.  Will consider repeat CT tomorrow  as pacer  contraindicates MRI.  Will treat with aspirin for now.  Consider heparin if  worsens. Stroke service to follow.                                                Michael L. Thad Ranger, M.D.    MLR/MEDQ  D:  05/12/2003  T:  05/12/2003  Job:  [id number]

## 2011-02-11 NOTE — Discharge Summary (Signed)
NAMEVENNA, BERBERICH                        ACCOUNT NO.:  [id number]   MEDICAL RECORD NO.:  [id number]                   PATIENT TYPE:  INP   LOCATION:  3002                                 FACILITY:  [facility]   PHYSICIAN:  Pramod P. Pearlean Brownie, MD                 DATE OF BIRTH:  Sep 20, 1922   DATE OF ADMISSION:  05/12/2003  DATE OF DISCHARGE:  05/14/2003                                 DISCHARGE SUMMARY   DIAGNOSES AT TIME OF DISCHARGE:  1. Left brain transient ischemic attack.  2. Cerebral artery aneurysm in 1992 with subsequent clipping, with     associated vasospasm and left brain stroke, residual aphasia.  3. Status post pacemaker for previous complete heart block.  4. Thyroidectomy at age 51.   MEDICATIONS AT TIME OF DISCHARGE:  1. Diovan 160 q.d.  2. Norvasc 5 mg q.d.  3. Synthroid 0.125 mg q.d.  4. Lipitor 10 mg q.d.  5. Aspirin 325 mg q.d.   STUDIES PERFORMED:  1. CT on admission showed old left frontal aneurysm with clip, and     corresponding encephalomalacia.  No acute intracranial abnormality.  2. Follow-up CT May 13, 2003:  Unchanged; no acute stroke.  3. Chest x-ray:  No active disease.  4. EKG:  Normal sinus rhythm, left axis deviation, AV sequential pacing.  5. Carotid Doppler:  No ICA stenosis, vertebral flow antegrade bilaterally.  6. Transcranial Doppler:  Completed.  Results pending at time of discharge.  7. 2-D echocardiogram:  Not done this admission, was done last week at     [facility], May 08, 2003, showing normal LV contraction with     moderate LVH.  MAC.  Grade I diastolic dysfunction.   LABORATORY STUDIES:  Cholesterol 164, triglycerides 93, HDL 51, LDL 94.  White blood cells 6.6, red blood cells 4.5, hemoglobin 13.6, hematocrit  39.7, platelets 272.  Coagulation studies normal.  Chemistry and liver  function normal.  Hemoglobin A1c 5.9, homocystine 9.49.  Cardiac enzymes  negative.  UA with trace hemoglobin 0-2 white blood cells, 0-2  red blood  cells.   HISTORY OF PRESENT ILLNESS:  [name] is a 77 year old, right-  handed, white female, has a past medical history of left-brain stroke with  aneurysm clipping with residual aphasia and pacemaker placement.  She was in  her state of really good health until she woke up the morning of admission,  at approximately 4:00 a.m., and felt she was weak and numb on her right  side.  She did manage to get to the bathroom and back, but found it was very  difficult for her and took her approximate 1 hour to do this.  She  subsequently called her sister who called her son.  911 was called and she  was transported to the emergency room.  She denies having any previous  symptoms similar to this.  She definitely has some  aphasia, but son reports  that this is mild form of aphasia from her previous stroke and it is at  baseline.  CT was negative for acute stroke.  Her symptoms are severe enough  for TPA.  She was admitted to the hospital for further stroke workup.   HOSPITAL COURSE:  The patient was unable to do an MRI secondary to  pacemaker.  Follow-up CT after initial CT was unrevealing for acute stroke.  It felt like she probably had a TIA with symptoms that resolved.  For  secondary stroke prevention, patient was placed on aspirin 325 mg every day.  She will continue medicines as prior to admission and followup with Dr.  Delia Heady in 2 months.  Transcranial Dopplers were pending at time of  discharge.   CONDITION AT DISCHARGE:  The patient was alert and oriented times 3.  Expressive aphasia present.  She has no focal weakness.  She is able to  ambulate independently.  She is emotionally labile.  She has no facial or  extremity weakness.  On May 13, 2003, there is a documented partial right  superior homonymous hemianopsia.   DISCHARGE PLAN:  1. Discharge home in care of son.  2. Followup transcranial Dopplers results as an outpatient.  3. Aspirin for  secondary stroke prevention.  4. Followup with Dr. Delia Heady in 2 months.      Annie Main, N.P.                         Pramod P. Pearlean Brownie, MD    SB/MEDQ  D:  05/14/2003  T:  05/15/2003  Job:  [id number]   cc:   Cecil Cranker, M.D.

## 2011-02-11 NOTE — Op Note (Signed)
[name], [name]                        ACCOUNT NO.:  [id number]   MEDICAL RECORD NO.:  [id number]                   PATIENT TYPE:  AMB   LOCATION:  ENDO                                 FACILITY:  [facility]   PHYSICIAN:  Danise Edge, M.D.                DATE OF BIRTH:  05/14/1922   DATE OF PROCEDURE:  09/24/2003  DATE OF DISCHARGE:                                 OPERATIVE REPORT   INDICATIONS FOR PROCEDURE:  The patient is a 77 year old female born  [birth year].  The patient had an episode of painless hematochezia which  has resolved.  A diagnostic colonoscopy is scheduled.   ENDOSCOPIST:  Danise Edge, M.D.   PREMEDICATION:  Versed 4 mg, Demerol 40 mg.   DESCRIPTION OF PROCEDURE:  After obtaining informed consent the patient was  placed in the left lateral decubitus position.  I administered intravenous  Demerol and intravenous Versed to achieve conscious sedation for the  procedure.  The patient's blood pressure, oxygen saturation and cardiac  rhythm were monitored throughout the procedure and documented in the record.   Anal inspection was normal.  Digital rectal examination was normal.  The  Olympus adjustable pediatric colonoscope was introduced into the rectum and  advanced to the cecum.  Colonic preparation for the exam today was  satisfactory.   Rectum:  Large, non-bleeding internal hemorrhoids which probably are the  source of her lower gastrointestinal bleeding.   Sigmoid colon and descending colon:  Extensive left colonic diverticulosis  without diverticulitis or diverticular stricture formation.   Splenic flexure:  Normal.   Transverse colon:  Normal.   Hepatic flexure:  Normal.   Ascending colon:  Normal.   Cecum and ileocecal valve:  Normal.   ASSESSMENT:  1. Normal proctocolonoscopy to the cecum.  No endoscopic evidence for the     presence of colorectal neoplasia, ischemic colitis, inflammatory bowel     disease or lower gastrointestinal  bleeding.  2. Extensive left colonic diverticulosis.  3. Large non-bleeding internal hemorrhoids.   RECOMMENDATIONS:  I think the patient's episode of painless hematochezia was  due to internal hemorrhoidal bleeding which has now stopped and no further  therapy or evaluation is required.                                               Danise Edge, M.D.    MJ/MEDQ  D:  09/24/2003  T:  09/24/2003  Job:  [id number]   cc:   Lenoard Aden, M.D.  [address], [address]  [place]  [place] [address]  Fax: [phone]

## 2011-02-11 NOTE — Assessment & Plan Note (Signed)
[facility]                         ELECTROPHYSIOLOGY OFFICE NOTE   NAME:[name], [name]                   MRN:          [id number]  DATE:12/06/2006                            DOB:          1922-01-25    [name] returns today for follow-up.  She is a very pleasant  elderly woman with a history of complete heart block and symptomatic  bradycardia, status post pacemaker insertion back in 2002.  The patient  had been bothered recently by bronchitis and is on antibiotics.  She  denies chest pain.  She denies shortness of breath.  She denies  peripheral edema.   PHYSICAL EXAMINATION:  GENERAL:  She is a pleasant, well-appearing  elderly woman in no acute distress.  VITAL SIGNS:  Blood pressure was 130/84, the pulse was 75 and regular,  the respirations were 18.  The weight was 146 pounds.  CARDIOVASCULAR:  Regular rate and rhythm with a normal S1 and S2.  EXTREMITIES:  No edema.  LUNGS:  Clear bilaterally to auscultation.  There were no wheezes, rales  or rhonchi.   MEDICATIONS:  Avelox, Aggrenox, Lipitor, Synthroid, hydrochlorothiazide,  Diovan, Norvasc, Aricept, Caltrate, and multivitamin.   Interrogation of her pacemaker demonstrates a Medtronic Kappa 800.  The  P waves were 2, the R wave was 8, the impedance 635 in the atrium and  643 in the ventricle, with a threshold of 0.5 at 0.4 in the atrium and  0.75 at 0.4 in the ventricle.  Battery voltage was 2.72 V.  There were  45 high-rate episodes in the atrium, the longest of which was 25  seconds.   IMPRESSION:  1. Symptomatic bradycardia.  2. Status post pacemaker insertion.  3. Hypertension.   DISCUSSION:  Overall [name] is stable.  Her bronchitis is  improving.  We will plan to see her back in pacemaker clinic in one  year.     Doylene Canning. Ladona Ridgel, MD  Electronically Signed    GWT/MedQ  DD: 12/06/2006  DT: 12/08/2006  Job #: [id number]

## 2011-05-23 ENCOUNTER — Other Ambulatory Visit: Payer: Self-pay | Admitting: Internal Medicine

## 2011-06-03 ENCOUNTER — Encounter: Payer: Self-pay | Admitting: Internal Medicine

## 2011-06-03 DIAGNOSIS — R7302 Impaired glucose tolerance (oral): Secondary | ICD-10-CM

## 2011-06-03 DIAGNOSIS — Z0000 Encounter for general adult medical examination without abnormal findings: Secondary | ICD-10-CM | POA: Insufficient documentation

## 2011-06-03 HISTORY — DX: Impaired glucose tolerance (oral): R73.02

## 2011-06-07 ENCOUNTER — Ambulatory Visit (INDEPENDENT_AMBULATORY_CARE_PROVIDER_SITE_OTHER): Payer: Medicare Other | Admitting: Internal Medicine

## 2011-06-07 ENCOUNTER — Encounter: Payer: Self-pay | Admitting: Internal Medicine

## 2011-06-07 VITALS — BP 118/70 | HR 94 | Temp 97.7°F | Ht 64.0 in | Wt 131.2 lb

## 2011-06-07 DIAGNOSIS — R7309 Other abnormal glucose: Secondary | ICD-10-CM

## 2011-06-07 DIAGNOSIS — I10 Essential (primary) hypertension: Secondary | ICD-10-CM

## 2011-06-07 DIAGNOSIS — F068 Other specified mental disorders due to known physiological condition: Secondary | ICD-10-CM

## 2011-06-07 DIAGNOSIS — R7302 Impaired glucose tolerance (oral): Secondary | ICD-10-CM

## 2011-06-07 DIAGNOSIS — E785 Hyperlipidemia, unspecified: Secondary | ICD-10-CM

## 2011-06-07 DIAGNOSIS — Z23 Encounter for immunization: Secondary | ICD-10-CM

## 2011-06-07 NOTE — Progress Notes (Signed)
Subjective:    Patient ID: [name], female    DOB: Jan 12, 1922, 77 y.o.   MRN: [id number]  HPI  Here to f/u with family;  Overall doing well;  Had estrogen cr tx per GYN and pessary for cystocele but o/w no complaints.  Hx limited due to dementia but Pt denies chest pain, increased sob or doe, wheezing, orthopnea, PND, increased LE swelling, palpitations, dizziness or syncope.  Pt denies new neurological symptoms such as new headache, or facial or extremity weakness or numbness   Pt denies polydipsia, polyuria  Pt states overall good compliance with meds, trying to follow lower cholesterol diet, wt overall increased from 124 to 131 with better diet recently, now in assisted living with one meal provided per day (no other meals or med assist but family is very proactive and states pt has been doing extremely well with admin her own meds and compliance).  Denies worsening reflux, dysphagia, abd pain, n/v, bowel change or blood.   Pt denies fever, wt loss, night sweats, loss of appetite, or other constitutional symptoms  Dementia overall stable symptomatically with gradual worsening at best, and not assoc with behavioral changes such as hallucinations, paranoia, or agitation.  Past Medical History  Diagnosis Date  . Aphasia 01/09/2009  . 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 CEREBROVASCULAR ACCIDENT, HX OF 11/20/2007  . CVA 01/09/2009  . DEMENTIA 05/19/2008  . DIVERTICULOSIS, COLON 11/20/2007  . FATIGUE 11/20/2007  . GLUCOSE INTOLERANCE 12/06/2010  . HYPERLIPIDEMIA 11/20/2007  . HYPERTENSION 11/20/2007  . HYPOTHYROIDISM 11/20/2007  . IRON DEFICIENCY 12/06/2010  . PACEMAKER, PERMANENT 01/09/2009  . TIA 05/14/2003  . URINARY INCONTINENCE 01/09/2009  . Impaired glucose tolerance 06/03/2011   Past Surgical History  Procedure Date  . Medtronic cynthia dual chamber pacemaker serial number was [id number]...04/15/2009   . Abdominal hysterectomy   . S/p cerebral aneurysm 1991  . S/p pacemaker ddd   .  Cholecystectomy   . S/p retinal attachment   . S/p thyroidectomy     reports that she has never smoked. She does not have any smokeless tobacco history on file. She reports that she does not drink alcohol or use illicit drugs. family history includes Cancer in her mother. Allergies  Allergen Reactions  . Codeine    Current Outpatient Prescriptions on File Prior to Visit  Medication Sig Dispense Refill  . AGGRENOX 25-200 MG per 12 hr capsule take 1 capsule by mouth twice a day  60 capsule  11  . amLODipine (NORVASC) 5 MG tablet Take 5 mg by mouth daily.        Marland Kitchen atorvastatin (LIPITOR) 10 MG tablet Take 10 mg by mouth daily.        . Calcium Carbonate-Vitamin D (CALTRATE 600+D) 600-400 MG-UNIT per tablet Take 1 tablet by mouth daily.        Marland Kitchen DIOVAN 160 MG tablet take 1 tablet by mouth once daily  30 tablet  11  . donepezil (ARICEPT) 10 MG tablet Take 10 mg by mouth daily.        . folic acid-pyridoxine-cyancobalamin (FOLTX) 2.5-25-2 MG TABS Take 1 tablet by mouth daily.        . hydrochlorothiazide (,MICROZIDE/HYDRODIURIL,) 12.5 MG capsule Take 12.5 mg by mouth daily.        Marland Kitchen levothyroxine (SYNTHROID, LEVOTHROID) 112 MCG tablet Take 112 mcg by mouth daily.        . Multiple Vitamin (MULTIVITAMIN) capsule Take 1 capsule by mouth daily.  Review of Systems Review of Systems  Constitutional: Negative for diaphoresis and unexpected weight change.  HENT: Negative for drooling and tinnitus.   Eyes: Negative for photophobia and visual disturbance.  Respiratory: Negative for choking and stridor.   Gastrointestinal: Negative for vomiting and blood in stool.  Genitourinary: Negative for hematuria and decreased urine volume.  Musculoskeletal: Negative for gait problem.  Has some hand/finger OA discomfort, mild daily but no other pain Skin: Negative for color change and wound.  Neurological: Negative for tremors and numbness.  Psychiatric/Behavioral: Negative for decreased concentration.  The patient is not hyperactive. Family does mention she can get SAD in the wintertime, but has not been severe enough to be tx since original tx approx 10 yrs ago, and no current symptoms     Objective:   Physical Exam BP 118/70  Pulse 94  Temp(Src) 97.7 F (36.5 C) (Oral)  Ht 5\' 4"  (1.626 m)  Wt 131 lb 4 oz (59.535 kg)  BMI 22.53 kg/m2  SpO2 97% Physical Exam  VS noted Constitutional: Pt appears well-developed and well-nourished.  HENT: Head: Normocephalic.  Right Ear: External ear normal.  Left Ear: External ear normal.  Eyes: Conjunctivae and EOM are normal. Pupils are equal, round, and reactive to light.  Neck: Normal range of motion. Neck supple.  Cardiovascular: Normal rate and regular rhythm.   Pulmonary/Chest: Effort normal and breath sounds normal.  Abd:  Soft, NT, non-distended, + BS Neurological: Pt is alert. No cranial nerve deficit.  Skin: Skin is warm. No erythema.  Psychiatric: Pt behavior is normal. Thought content c/w mild to mod dementia. Not depressed affect or nervous. Smiling today        Assessment & Plan:

## 2011-06-07 NOTE — Assessment & Plan Note (Signed)
stable overall by hx and exam, most recent data reviewed with pt, and pt to continue medical treatment as before  Lab Results  Component Value Date   LDLCALC 72 12/06/2010

## 2011-06-07 NOTE — Assessment & Plan Note (Signed)
stable overall by hx and exam, most recent data reviewed with pt, and pt to continue medical treatment as before  Lab Results  Component Value Date   WBC 8.0 12/06/2010   HGB 12.9 12/06/2010   HCT 37.9 12/06/2010   PLT 270.0 12/06/2010   CHOL 150 12/06/2010   TRIG 74.0 12/06/2010   HDL 63.10 12/06/2010   LDLDIRECT 73.4 11/18/2009   ALT 21 12/06/2010   AST 24 12/06/2010   NA 138 12/06/2010   K 3.5 12/06/2010   CL 103 12/06/2010   CREATININE 0.8 12/06/2010   BUN 25* 12/06/2010   CO2 27 12/06/2010   TSH 0.73 12/06/2010   INR 1.0 04/15/2009   HGBA1C 5.9 12/06/2010   BP Readings from Last 3 Encounters:  06/07/11 118/70  12/06/10 120/70  08/10/10 122/60

## 2011-06-07 NOTE — Patient Instructions (Signed)
You had the flu shot today Continue all other medications as before Please call if you feel you need medication for SAD later this year Please return in 6 months, or sooner if needed

## 2011-06-07 NOTE — Assessment & Plan Note (Signed)
stable overall by hx and exam, most recent data reviewed with pt, and pt to continue medical treatment as before  Lab Results  Component Value Date   HGBA1C 5.9 12/06/2010

## 2011-07-06 ENCOUNTER — Inpatient Hospital Stay (HOSPITAL_COMMUNITY)
Admission: EM | Admit: 2011-07-06 | Discharge: 2011-07-11 | DRG: 069 | Disposition: A | Discharge status: Skilled Nursing Facility | Payer: Medicare Other | Attending: Internal Medicine | Admitting: Internal Medicine

## 2011-07-06 ENCOUNTER — Inpatient Hospital Stay (HOSPITAL_COMMUNITY): Payer: Medicare Other

## 2011-07-06 ENCOUNTER — Emergency Department (HOSPITAL_COMMUNITY): Payer: Medicare Other

## 2011-07-06 DIAGNOSIS — E039 Hypothyroidism, unspecified: Secondary | ICD-10-CM | POA: Diagnosis present

## 2011-07-06 DIAGNOSIS — E876 Hypokalemia: Secondary | ICD-10-CM | POA: Diagnosis not present

## 2011-07-06 DIAGNOSIS — I672 Cerebral atherosclerosis: Secondary | ICD-10-CM | POA: Diagnosis present

## 2011-07-06 DIAGNOSIS — Z7902 Long term (current) use of antithrombotics/antiplatelets: Secondary | ICD-10-CM

## 2011-07-06 DIAGNOSIS — K5900 Constipation, unspecified: Secondary | ICD-10-CM | POA: Diagnosis not present

## 2011-07-06 DIAGNOSIS — E785 Hyperlipidemia, unspecified: Secondary | ICD-10-CM | POA: Diagnosis present

## 2011-07-06 DIAGNOSIS — Z950 Presence of cardiac pacemaker: Secondary | ICD-10-CM

## 2011-07-06 DIAGNOSIS — F0150 Vascular dementia without behavioral disturbance: Secondary | ICD-10-CM | POA: Diagnosis present

## 2011-07-06 DIAGNOSIS — G459 Transient cerebral ischemic attack, unspecified: Principal | ICD-10-CM | POA: Diagnosis present

## 2011-07-06 DIAGNOSIS — Z79899 Other long term (current) drug therapy: Secondary | ICD-10-CM

## 2011-07-06 DIAGNOSIS — R32 Unspecified urinary incontinence: Secondary | ICD-10-CM | POA: Diagnosis present

## 2011-07-06 DIAGNOSIS — I10 Essential (primary) hypertension: Secondary | ICD-10-CM | POA: Diagnosis present

## 2011-07-06 DIAGNOSIS — I442 Atrioventricular block, complete: Secondary | ICD-10-CM

## 2011-07-06 DIAGNOSIS — R7989 Other specified abnormal findings of blood chemistry: Secondary | ICD-10-CM

## 2011-07-06 DIAGNOSIS — I2510 Atherosclerotic heart disease of native coronary artery without angina pectoris: Secondary | ICD-10-CM

## 2011-07-06 DIAGNOSIS — I69920 Aphasia following unspecified cerebrovascular disease: Secondary | ICD-10-CM

## 2011-07-06 LAB — TSH: TSH: 2.329 u[IU]/mL (ref 0.350–4.500)

## 2011-07-06 LAB — CBC
MCV: 89.4 fL (ref 78.0–100.0)
Platelets: 287 10*3/uL (ref 150–400)
RBC: 3.79 MIL/uL — ABNORMAL LOW (ref 3.87–5.11)
WBC: 8 10*3/uL (ref 4.0–10.5)

## 2011-07-06 LAB — CK TOTAL AND CKMB (NOT AT ARMC)
CK, MB: 13.2 ng/mL (ref 0.3–4.0)
CK, MB: 12.2 ng/mL (ref 0.3–4.0)
Relative Index: 6.6 — ABNORMAL HIGH (ref 0.0–2.5)
Total CK: 196 U/L — ABNORMAL HIGH (ref 7–177)

## 2011-07-06 LAB — COMPREHENSIVE METABOLIC PANEL
AST: 28 U/L (ref 0–37)
Albumin: 3.2 g/dL — ABNORMAL LOW (ref 3.5–5.2)
Calcium: 9 mg/dL (ref 8.4–10.5)
Chloride: 105 mEq/L (ref 96–112)
Creatinine, Ser: 0.8 mg/dL (ref 0.50–1.10)
Total Protein: 7 g/dL (ref 6.0–8.3)

## 2011-07-06 LAB — DIFFERENTIAL
Eosinophils Absolute: 0.1 10*3/uL (ref 0.0–0.7)
Lymphocytes Relative: 19 % (ref 12–46)
Lymphs Abs: 1.5 10*3/uL (ref 0.7–4.0)
Neutrophils Relative %: 70 % (ref 43–77)

## 2011-07-06 LAB — URINALYSIS, ROUTINE W REFLEX MICROSCOPIC
Ketones, ur: NEGATIVE mg/dL
Leukocytes, UA: NEGATIVE
Nitrite: NEGATIVE
Protein, ur: NEGATIVE mg/dL
Urobilinogen, UA: 0.2 mg/dL (ref 0.0–1.0)
pH: 6 (ref 5.0–8.0)

## 2011-07-06 LAB — HEMOGLOBIN A1C: Hgb A1c MFr Bld: 6.1 % — ABNORMAL HIGH (ref <5.7)

## 2011-07-06 LAB — PROTIME-INR: INR: 1.06 (ref 0.00–1.49)

## 2011-07-06 MED ORDER — IOHEXOL 350 MG/ML SOLN
50.0000 mL | Freq: Once | INTRAVENOUS | Status: AC | PRN
  Administered 2011-07-06: 50 mL via INTRAVENOUS

## 2011-07-07 ENCOUNTER — Inpatient Hospital Stay (HOSPITAL_COMMUNITY): Payer: Medicare Other

## 2011-07-07 DIAGNOSIS — I369 Nonrheumatic tricuspid valve disorder, unspecified: Secondary | ICD-10-CM

## 2011-07-07 DIAGNOSIS — I6789 Other cerebrovascular disease: Secondary | ICD-10-CM

## 2011-07-07 LAB — CARDIAC PANEL(CRET KIN+CKTOT+MB+TROPI)
CK, MB: 7.4 ng/mL (ref 0.3–4.0)
Total CK: 161 U/L (ref 7–177)

## 2011-07-07 NOTE — Procedures (Signed)
REFERRING PHYSICIAN:  Pramod P. Pearlean Brownie, MD  HISTORY:  An 77 year old female, status post fall with gait instability.  MEDICATIONS:  Aspirin, Aggrenox, Lovenox, Acular, prednisone, Aricept, Norvasc, levothyroxine, hydrochlorothiazide, Benicar and Crestor.  CONDITIONS OF RECORDING:  This is a 16-channel EEG carried out with the patient in the awake, drowsy and asleep states.  DESCRIPTION:  The waking background activity consists of a low-voltage symmetrical fairly well-organized 9 Hz alpha activity seen from the parieto-occipital and posterior temporal regions.  Low-voltage fast activity poorly organized was seen anteriorly and during at times superimposed on more posterior rhythms.  A mixture of theta and alpha rhythm was seen from the central and temporal regions.  The patient drowses with slowing to irregular, low-voltage theta and beta activity. The patient goes into a light sleep with symmetrical sleep spindles, vertex with a sharp activity and irregular slow activity. Hypoventilation was performed and produced a moderate buildup.  No abnormalities were noted.  Intermittent photic stimulation was performed.  No changes in the tracing were noted with intermittent photic stimulation.  IMPRESSION:  This is a normal EEG.  No epileptiform activity was noted.          ______________________________ Thana Farr, MD    OZ:HYQM D:  07/07/2011 17:39:04  T:  07/07/2011 22:30:27  Job #:  [id number]

## 2011-07-08 LAB — BASIC METABOLIC PANEL
BUN: 11 mg/dL (ref 6–23)
CO2: 25 mEq/L (ref 19–32)
Calcium: 8.3 mg/dL — ABNORMAL LOW (ref 8.4–10.5)
Creatinine, Ser: 0.61 mg/dL (ref 0.50–1.10)

## 2011-07-08 LAB — CBC
HCT: 32.3 % — ABNORMAL LOW (ref 36.0–46.0)
MCH: 31 pg (ref 26.0–34.0)
MCV: 90.2 fL (ref 78.0–100.0)
Platelets: 274 10*3/uL (ref 150–400)
RBC: 3.58 MIL/uL — ABNORMAL LOW (ref 3.87–5.11)
RDW: 12.8 % (ref 11.5–15.5)

## 2011-07-08 LAB — LIPID PANEL
Cholesterol: 123 mg/dL (ref 0–200)
VLDL: 18 mg/dL (ref 0–40)

## 2011-07-09 LAB — BASIC METABOLIC PANEL
BUN: 15 mg/dL (ref 6–23)
Chloride: 103 mEq/L (ref 96–112)
GFR calc Af Amer: 86 mL/min — ABNORMAL LOW (ref >90)
GFR calc non Af Amer: 74 mL/min — ABNORMAL LOW (ref >90)
Potassium: 3.2 mEq/L — ABNORMAL LOW (ref 3.5–5.1)
Sodium: 139 mEq/L (ref 135–145)

## 2011-07-10 LAB — BASIC METABOLIC PANEL
BUN: 19 mg/dL (ref 6–23)
CO2: 25 mEq/L (ref 19–32)
Chloride: 100 mEq/L (ref 96–112)
Glucose, Bld: 125 mg/dL — ABNORMAL HIGH (ref 70–99)
Potassium: 3.7 mEq/L (ref 3.5–5.1)
Sodium: 136 mEq/L (ref 135–145)

## 2011-07-10 LAB — CBC
HCT: 34.5 % — ABNORMAL LOW (ref 36.0–46.0)
Hemoglobin: 11.8 g/dL — ABNORMAL LOW (ref 12.0–15.0)
MCHC: 34.2 g/dL (ref 30.0–36.0)
RBC: 3.86 MIL/uL — ABNORMAL LOW (ref 3.87–5.11)
WBC: 8.1 10*3/uL (ref 4.0–10.5)

## 2011-07-10 NOTE — Consult Note (Addendum)
[name], [name] NO.:  [id number]  MEDICAL RECORD NO.:  [id number]  LOCATION:  3703                         FACILITY:  [facility]  PHYSICIAN:  Hillis Range, MD       DATE OF BIRTH:  1922/03/19  DATE OF CONSULTATION:  07/06/2011 DATE OF DISCHARGE:                                CONSULTATION   PRIMARY CARDIOLOGIST:  Doylene Canning. Ladona Ridgel, MD  PRIMARY CARE DOCTOR:  Corwin Levins, MD  CHIEF COMPLAINT:  Fall and weakness, facial droop.  REASON FOR CONSULTATION:  Elevated cardiac enzymes.  HISTORY OF PRESENT ILLNESS:  [name] is a pleasant 77 year old lady with a history of complete heart block status post Medtronic pacemaker implantation, multiple CVAs/TIAs in the past, and some baseline dementia who presented to [facility] with complaints of weakness and fall.  Her family states she just was not acting herself yesterday, they thought she may be coming down with something or suspected maybe she was sad because a friend of the family had passed away.  However, in the evening, [name], per her own report got up to go to the bathroom and fell.  She is unsure how long she was down on the floor for and states she was very determined to get up and finally did.  Her daughter- in-law came to visit her this morning, and when she rang the doorbell, it took the patient 15 minutes before she got downstairs to open the door.  The patient's daughter-in-law then noted that she had a left- sided facial droop and was leaning to one side, and her speech was much more slurred than usual.  She does have some baseline expressive aphasia.  [name] did not seem to be in any other acute distress, but had difficulty ambulating and her daughter-in-law had to basically assist her into bed before she called EMS.  Upon arrival to [facility], vital signs were stable.  CT of the head demonstrated encephalomalacia in the territory of her prior known left MCA CVA with  chronic small vessel disease, but no acute findings.  Lab work is remarkable for mild anemia as well as positive troponin of 3.62. EKG is V-paced.  The patient adamantly denies any chest pain whatsoever, shortness of breath, orthopnea, PND, palpitations, or any prior episodes of syncope.  PAST MEDICAL HISTORY: 1. Complete heart block, status post Medtronic pacemaker implantation     in July 2010. 2. History of CVAs/TIA.     a.     She still has residual aphasia from a CVA that occurred      after cerebral aneurysm clipping in 1992.     b.     Followed by Dr. Pearlean Brownie. 3. Baseline dementia.  Her son states she is fairly functional living     by herself, but states that she has had cognitive decline and requires family to look in on her. 4. Hypothyroidism. 5. Urinary incontinence. 6. Hypertension. 7. Dyslipidemia. 8. Diverticulosis.  PAST SURGICAL HISTORY:  Cholecystectomy, cataract surgery yesterday, hysterectomy, and thyroidectomy.  MEDICATIONS: 1. Aggrenox 1 tablet b.i.d. 2. Aricept 10 mg daily. 3. Diovan 160 mg daily. 4. Hydrochlorothiazide 12.5 mg daily. 5. Lipitor 10 mg daily.  6. Norvasc 5 mg daily. 7. Synthroid 112 mcg daily. 8. Caltrate. 9. Foltx multivitamin. 10.Besivance right eye drop b.i.d. 11.Prednisolone bilateral eyes q.i.d. 12.Ketorolac bilateral eyes q.i.d. 13.Estradiol vaginal cream twice weekly.  ALLERGIES:  CODEINE.  SOCIAL HISTORY:  [name] lives in [place] by herself at [employer].  Her son and daughter-in-law look in on her twice a week and also call her daily.  [name] denies any tobacco or alcohol use.  FAMILY HISTORY:  Positive for cancer in her mother.  The patient does not recall if there is coronary artery disease in her family.  REVIEW OF SYSTEM:  No fevers, chills, nausea, vomiting, diarrhea, bright red blood per rectum, melena, or hematemesis.  No chest pain or shortness of breath.  See HPI for pertinent positives.  All  other systems reviewed and otherwise negative.  LABS:  WBC 8, hemoglobin 11.6, hematocrit 31.9, and platelet count 287. Sodium 140, potassium 3.6, chloride 105, CO2 25, glucose 115, BUN 22, creatinine 0.8, albumin 3.2.  CK 201, MB 13.2, troponin 3.2.  UA is negative.  EKG:  V-paced, 60 beats per minute.  RADIOLOGIC STUDIES: 1. CT of the head showed large area of encephalomalacia within the     left MCA territory where she had her prior infarcts.  No acute     findings.  Chronic small vessel disease. 2. Chest x-ray showed no acute disease.  PHYSICAL EXAM:  VITAL SIGNS:  Temperature 97.1, pulse 69, respirations 18, blood pressure 121/65, pulse ox 100% on 2 L. GENERAL:  This is a pleasant elderly white female, in no acute distress, lying flat in bed. HEENT:  Normocephalic, atraumatic.  Extraocular movements are intact. Clear sclerae.  Nares are without discharge.  She has watery eyes. NECK:  Supple without evidence for JVD. HEART:  Auscultation of the heart reveals regular rate and rhythm with S1, S2 without murmurs, rubs, or gallops. LUNGS:  Clear to auscultation bilaterally without wheezes, rales, or rhonchi. ABDOMEN:  Soft, nontender, and nondistended.  Positive bowel sounds. EXTREMITIES:  Warm and dry without edema.  She has 2+ pedal pulses bilaterally. NEUROLOGIC:  She is slow to respond to some questions, but does know where she is.  She does not know the date, but is oriented to self. Grip is equal.  No obvious focal deficits.  ASSESSMENT/PLAN:  The patient was seen and examined by Dr. Johney Frame and myself.  [name] is an 77 year old lady with a history of cerebrovascular accidents/transient ischemic attacks, some baseline dementia, and pacemaker implantation who presents to [facility] with an episode of fall, weakness, facial droops, and slumping to one side.  She has had residual weakness and expressive aphasia that her daughter-in-law witnessed this morning.   There was a modest increase in cardiac markers without clinical symptoms of ischemia.  Her pacemaker interrogation reveals normal pacemaker function with no recent arrhythmias.  We feel that her symptoms are worrisome for primary neurologic event such as an acute CVA.  We recommend treatment of CVA per Neurology.  We will plan for 2D echocardiogram.  At this time, her troponin is of uncertain clinical significance and we will treat conservatively given absence of clinical symptoms of MI.  We would recommend to resume home medications including aspirin and statin.  We will hold off on heparin in the setting of her neurologic changes.  We will cycle cardiac enzymes.  You may consider beta-blocker, but we would hold off for now in the setting of her variable blood pressure.  We plan for no further CAD evaluation unless ischemic symptoms occur.  We will continue to follow with you.     Ronie Spies, P.A.C.   ______________________________ Hillis Range, MD    DD/MEDQ  D:  07/06/2011  T:  07/06/2011  Job:  [id number]  cc:   Corwin Levins, MD Doylene Canning. Ladona Ridgel, MD  Electronically Signed by Ronie Spies  on 07/10/2011 03:16:03 PM Electronically Signed by Hillis Range MD on 07/28/2011 02:34:09 PM

## 2011-07-18 NOTE — Consult Note (Signed)
[name], ZERVAS NO.:  [id number]  MEDICAL RECORD NO.:  [id number]  LOCATION:  3703                         FACILITY:  [facility]  PHYSICIAN:  Thana Farr, MD    DATE OF BIRTH:  01-23-1922  DATE OF CONSULTATION:  07/06/2011 DATE OF DISCHARGE:                                CONSULTATION   REASON FOR CONSULTATION:  Stroke.  HISTORY OF PRESENT ILLNESS:  This is a pleasant 77 year old female with past medical history of hypertension, hypercholesterolemia, previous right CVA with left-sided weakness, hypothyroidism, permanent pacemaker, urinary incontinence, TIA, dementia, diverticulitis, cerebral aneurysm.  The patient apparently got up this morning at approximately 7:30 am and had weakness, especially noted on her left side and fell.  The patient cannot recall much of what happened other than she is very very weak and had some difficulty getting up.  Her daughter who usually comes and visits her around 7:30 happened to come by and noticed that her mother had a significant left facial droop, was more dysarthric than her baseline dysarthria and aphasia.  In addition, when she was stood up by her daughter she was noted to be very off balanced, listing to the left than the right.  For this reason, her daughter was very concerned that she might have had a stroke and brought her to the emergency department. The patient, at present time, is aphasic more receptive than expressive. She does have a drift on the left upper extremity, weakness in her left arm.  Her facial droop has very much resolved at this point in time. Per daughter who is in the room, the patient was previously on aspirin, Plavix and was switched multiple years ago back to Aggrenox after she has had an ocular stroke.  PAST MEDICAL HISTORY:  As noted above.  MEDICATIONS:  Aggrenox, Aricept, Diovan, hydrochlorothiazide, Lipitor, Norvasc, Synthroid, and estradiol.  ALLERGIES:  No known drug  allergies.  SOCIAL HISTORY:  The patient does not smoke, drink, or do illicit drugs.  REVIEW OF SYSTEMS:  Negative with exception as above.  PHYSICAL EXAMINATION:  VITAL SIGNS:  Blood pressure is 152/79, pulse 69, respirations 16, and temperature 98.5. GENERAL:  The patient is alert.  She knows she is in the hospital, but she is not oriented to year or president. NEUROLOGIC:  She carries out 2 and 3-step commands with difficulty. Pupils are equal, round, and reactive to light.  Conjugate gaze. Extraocular movements are intact.  She does show more of a ptosis on the right than the left.  This is her normal baseline per daughter.  Her visual fields are intact to double simultaneous stimuli.  Face has a slight left facial droop, which has per family member improved significantly.  Her tongue is midline.  Uvula is midline.  She does show slurred speech and dysarthria.  She also has noted more a receptive aphasia than expressive.  She has difficulty doing 2 and 3-step commands.  Facial sensation is stated to be full from the patient. Finger-to-nose was smooth.  Heel-to-shin was smooth.  Fine motor movements were normal.  The patient's motor showed a positive drift on the left upper extremity and left lower extremity.  I did not note  any significant proximal weakness on the right.  She has slight proximal weakness on the left upper extremity.  Her grips are equal bilaterally. Her lower extremities were equal bilaterally at 4/5.  The patient had a drift as stated on her left upper and left lower.  Her right her deep tendon reflexes were 2+ throughout and upgoing toe on the right and downgoing toe on the left.  Sensation was stated to be intact bilaterally to pinprick and light touch; however, the patient is slightly aphasic and is a difficulty sensory exam.  LABORATORY DATA:  Her troponin is elevated at 3.27, CK is 196, and CK-MB is 12.2.  Cardiology is assessing this.  UA is negative.   Sodium is 140, potassium 3.6, chloride is 105, CO2 is 25, BUN 22, creatinine 0.80, glucose is 115.  White blood cell count is 8.0, platelets 287, hemoglobin 11.6, and hematocrit 33.9.  IMAGING:  At this point in time, the patient's CT of her head did show an old large left MCA infarct, which is stable, surgical clips in the region of her left sylvian fissure, chronic small vessel disease throughout her white matter.  No hydrocephalus.  No acute infarction. CTA of head and neck are pending.  ASSESSMENT:  This is a pleasant 77 year old female with previous left middle cerebellar artery infarct, history of multiple transient ischemic attacks now presenting with sudden onset of left facial droop and left- sided weakness upon awakening.  Some of the patient's symptoms have resolved; however, the patient continues to have a left arm drift, left leg drift, and slight left facial droop.  The patient has been placed on aspirin and Plavix in the past and currently on Aggrenox.  She is already on Lipitor.  CT of head is negative at this time.  This most likely represents a right brain stroke.  RECOMMENDATIONS: 1. Agree with CTA of head and neck. 2. Continue Aggrenox. 3. Fasting lipid panel, HbA1c, TSH. 4. Continue Cardiology followup for elevated troponin.  Dr. Thad Ranger has seen and evaluated the patient and agrees with above.     Felicie Morn, PA-C   ______________________________ Thana Farr, MD    DS/MEDQ  D:  07/06/2011  T:  07/07/2011  Job:  [id number]  Electronically Signed by Felicie Morn PA-C on 07/18/2011 01:09:35 PM Electronically Signed by Thana Farr MD on 07/18/2011 01:22:27 PM

## 2011-07-19 NOTE — Discharge Summary (Signed)
[name] [name], [name]            ACCOUNT NO.:  [id number]  MEDICAL RECORD NO.:  [id number]  LOCATION:  3703                         FACILITY:  [facility]  PHYSICIAN:  Thad Ranger, MD       DATE OF BIRTH:  [birth year]  DATE OF ADMISSION:  07/06/2011 DATE OF DISCHARGE:                              DISCHARGE SUMMARY   PRIMARY CARE PHYSICIAN:  Corwin Levins, MD  PRIMARY NEUROLOGIST:  Pramod P. Pearlean Brownie, MD  DISCHARGE DIAGNOSES: 1. Transient ischemic attack with falls. 2. Elevated troponins without clinical evidence of myocardial     infarction. 3. Hypertension. 4. History of pacemaker. 5. Constipation. 6. History of vascular dementia. 7. Hypothyroidism. 8. Hypokalemia.  CONSULTATIONS: 1. Cardiology [place], Dr. Hillis Range. 2. Neurology, Dr. Pearlean Brownie.  DISCHARGE MEDICATIONS: 1. Colace 100 mg p.o. b.i.d., hold for diarrhea. 2. MiraLAX 17 g p.o. daily, hold for diarrhea. 3. Diovan 320 mg p.o. daily. 4. Norvasc 10 mg p.o. daily. 5. Aggrenox 25/200 one capsule p.o. b.i.d. 6. Aricept 10 mg p.o. daily. 7. Besivance 0.6% ophthalmic solution 1 drop in right eye twice daily. 8. Caltrate D OTC 1 tablet p.o. daily. 9. Estrace 0.01% vaginal cream 1 g vaginally twice per week Wednesday     and Saturday. 10.Folbic 1 tablet p.o. daily. 11.Hydrochlorothiazide 12.5 mg p.o. daily. 12.Ketorolac 0.4% ophthalmic 1 drop in both eyes 4 times daily. 13.Lipitor 10 mg p.o. daily. 14.Multivitamin 1 tablet p.o. daily. 15.Prednisolone acetate 1% one drop in both eyes q.i.d. 16.Synthroid 112 mcg 1 tablet p.o. daily.  BRIEF HISTORY OF PRESENT ILLNESS:  At [name] time of admission, [name] [name] is an 77 year old female with a history of multiple strokes and TIAs in [name] past, hypertension, history of complete heart block, pacemaker, history of hypothyroidism, vascular dementia who presented to [facility] Emergency room due to confusion and increasing weakness.  At [name] time of admission, [name] [name]'s history  was obtained from [name] [name]'s daughter-in-law and [name] [name].  [name] [name] was not sure if she had a syncopal episode or a mechanical fall on my previous 2 admissions.  [name] [name] was not able to get up from [name] bathroom floor due to weakness and [name] [name]'s daughter-in-law was visiting her on [name] morning of admission to drop eye drops when she found [name] [name] with increasing weakness and unable to get up.  She also found her more disoriented and some slight drooping on [name] left side of [name] face.  [name] [name]'s daughter-in-law also reported that she felt more weak and was leaning more towards [name] left side.  [name] [name] was admitted for further workup.  RADIOLOGICAL DATA:  Chest x-ray July 06, 2011, minimal lingular atelectasis.  No scarring or active disease.  CT head without contrast July 06, 2011, large area of encephalomalacia in [name] left MCA territory, chronic small-vessel disease.  CT angio of [name] head and neck were done on July 06, 2011.  Although [name] radiologist has not interpreted [name] results, Dr. Pearlean Brownie reviewed [name] CT imaging from Neurology Service who did not feel that [name] [name] had an any acute CVA on CT angio of [name] head and neck.  [name] 2D echocardiogram July 07, 2011, showed EF of 55% to 60%,  normal wall motion, no regional wall motion abnormalities.  No diastolic dysfunction.  Carotid Dopplers, no evidence of ICA stenosis.  Vertebrals are patent with antegrade flow. EEG on July 07, 2011, was normal EEG with no epileptiform activity noted.  BRIEF HOSPITALIZATION COURSE:  [name] [name] is an 77 year old female who was admitted with increasing weakness is likely secondary to TIA.  [name] [name] was also noted to have elevated troponins in [name] emergency room. 1. Recurrent TIA/right brain TIA.  [name] [name] was admitted to [name]     monitored floor.  She underwent CTA of [name] head and neck, which per     Dr. Pearlean Brownie, Neurology Service does not show any acute  stroke.  [name]     [name] was continued on Aggrenox and BP control.  Adjustments were     made to her antihypertensives for [name] strict BP control.  [name]     [name] underwent full stroke workup and physical therapy     evaluation.  Per PT eval, she will benefit from short-term rehab. 2. Elevated troponins.  Asymptomatic with no clinical evidence of MI.     [name] [name] was seen by Cardiology Service.  Pacemaker was     interrogated and revealed normal pacemaker function with no recent     arrhythmias.  A 2D echocardiogram was obtained, which did not show     any significant regional wall motion abnormalities.  [name] [name]     was continued on Aggrenox and [facility] recommended to     continue home medication including [name] statin. 3. Dementia.  [name] [name] was continued on Aricept. 4. Hypokalemia.  This was replaced.  [name] [name] will be discharged to short-term rehab pending bed available.  PHYSICAL EXAMINATION:  VITAL SIGNS:  At [name] time of [name] dictation, temperature 98.3, pulse 69, respirations 20, blood pressure 134/77, O2 sats 96% on room air. GENERAL:  [name] [name] is alert and awake, oriented, not in any acute distress. HEENT:  Anicteric sclerae, clear conjunctivae.  Pupils reactive to light and accommodation. NECK:  Supple.  No lymphadenopathy. CVS:  S1 and S2 clear. CHEST:  Fairly clear to auscultation bilaterally. ABDOMEN:  Soft, nontender, nondistended.  Normal bowel sounds. EXTREMITIES:  No cyanosis, clubbing, or edema noted in upper or lower extremities bilaterally.  DISCHARGE FOLLOWUP:  With Dr. Oliver Barre within next 7-10 days and Dr. Delia Heady in 2 weeks.  Follow up with Dr. Lewayne Bunting in 2 weeks.  DISCHARGE TIME:  35 minutes.     Thad Ranger, MD     RR/MEDQ  D:  07/11/2011  T:  07/11/2011  Job:  [id number]  cc:   Corwin Levins, MD Pramod P. Pearlean Brownie, MD Doylene Canning. Ladona Ridgel, MD  Electronically Signed by Andres Labrum RAI  on 07/19/2011 01:37:16 PM

## 2011-07-19 NOTE — H&P (Signed)
[name], [name]            ACCOUNT NO.:  [id number]  MEDICAL RECORD NO.:  [id number]  LOCATION:  3703                         FACILITY:  [facility]  PHYSICIAN:  Thad Ranger, MD       DATE OF BIRTH:  December 13, 1921  DATE OF ADMISSION:  07/06/2011 DATE OF DISCHARGE:                             HISTORY & PHYSICAL   PRIMARY CARE PHYSICIAN:  Corwin Levins, MD  PRIMARY NEUROLOGIST:  Pramod P. Pearlean Brownie, MD  CHIEF COMPLAINT:  Increasing weakness, confusion, and fall since morning.  HISTORY OF PRESENT ILLNESS:  [name] is an 77 year old female with history of multiple strokes and TIAs, hypertension, history of complete heart block with a pacemaker, history of hypothyroidism, vascular dementia presented to the [facility] emergency room due to confusion and increasing weakness.  History was obtained from the patient's daughter- in-law and the patient.  The patient could not recollect all the events preceding to the fall.  She was not sure if she had a syncopal episode or a mechanical fall last night.  According to the patient's daughter-in- law, she states that the patient has chronic expressively aphasia secondary to the multiple strokes in the past and she fell some time during the night in the bathroom.  The patient was not able to get up due to the weakness.  The patient's daughter-in-law reported eye surgery this Monday and had visited her this morning for the eye drops and she found [name] with increasing weakness and unable to get up.  She also found her disoriented and some slight drooping on the left side of the face.  The patient's daughter-in-law reported that she had noticed [name] to be somewhat slow yesterday, however, still she was ambulating without any difficulty and without any assistance up until yesterday.  This morning, however, she felt more weak and per the daughter-in-law, she was leaning more towards the left side.  Otherwise, the patient denied any  chest pain or any shortness of breath, nausea, vomiting, any abdominal pain, diarrhea, constipation, hematochezia, or melena.  REVIEW OF SYSTEMS:  Pertinent positives are dictated above, otherwise negative.  PAST MEDICAL HISTORY: 1. Hypothyroidism. 2. Hypertension. 3. History of complete heart block and pacemaker in 2002. 4. History of recurrent strokes and TIAs. 5. Vascular dementia.  SOCIAL HISTORY:  The patient lives alone in an independent facility in [place].  She denies any smoking, alcohol, or any drug use.  ALLERGIES:  CODEINE.  MEDICATIONS:  Prior to admission, 1. Ketorolac 0.4% ophthalmic both eyes 1 drop 4 times daily. 2. Estrace 0.01% vaginal cream twice per week. 3. Prednisolone acetate 1 drop 4 times daily. 4. Besivance 0.6% in right eye 1 drop twice daily. 5. Caltrate D 1 tablet daily. 6. Multivitamin 1 tablet daily. 7. Hydrochlorothiazide 12.5 mg daily. 8. Aricept 10 mg daily. 9. Norvasc 5 mg daily. 10.Synthroid 112 mcg daily. 11.Folbic 1 tablet daily. 12.Diovan 160 mg p.o. daily. 13.Aggrenox 25/200 one tablet b.i.d. 14.Lipitor 10 mg daily.  PHYSICAL EXAM:  VITAL SIGNS:  Blood pressure 141/66, pulse 65, respirations 14, temperature 98.8. GENERAL:  The patient is alert and awake, not in any acute distress, some expressive a aphasia but able to understand. HEENT:  Anicteric sclerae.  Pink conjunctivae.  Pupils reactive to light and accommodation.  EOMI. NECK:  Supple.  No lymphadenopathy.  No JVD. CVS:  S1, S2 clear.  Regular rate and rhythm. CHEST:  Clear to auscultation bilaterally. ABDOMEN:  Soft, nontender, nondistended.  Normal bowel sounds. EXTREMITIES:  No cyanosis, clubbing, or edema noted in upper or lower extremities bilaterally. NEURO:  Some expressive aphasia with decreased strength in all extremities. SKIN:  No rashes.  No petechiae.  LABORATORY DATA:  Troponin 3.27.  Initial CK 196, MB 12.2, relative index 6.2.  Stroke panel showed  hemoglobin of 11.6, hematocrit 33.9, WBC 8.0, platelets 287.  CK 201, MB 13.2, index 6.6.  Troponin 3.6.  UA negative for any UTI.  EKG showed rate of 60 paced rhythm.  RADIOLOGICAL DATA:  Chest x-ray on July 06, 2011, minimal lingular atelectasis or scarring, no active disease.  CT of head without contrast on July 06, 2011, large area of encephalomalacia within the left MCA territory, chronic small vessel disease, no acute findings.  IMPRESSION AND PLAN:  [name] is an 77 year old female with a history of multiple transient ischemic attacks and cerebrovascular accidents in the past, hypertension, hypothyroidism, history of complete heart block presenting with increasing weakness with fall, unclear if the patient had another episode of transient ischemic attack/cerebrovascular accident or syncopal episode or the patient had a mechanical fall.  She could not recollect the events. 1. Fall with question of transient ischemic attack versus syncope     versus mechanical fall.  The patient will be admitted to the tele     monitored floor.  Given she had extensive and recurrent     cerebrovascular accidents in the past with history of expressive     aphasia, the patient will be admitted with stroke workup.  I will     obtain CTA of the head and neck given the contraindication to MRI     due to pacemaker.  The patient will continue Aggrenox for now and     will have 2D echo, carotid Dopplers for additional workup.     Neurology will be consulted and the patient does follow with Dr.     Delia Heady and will await further recommendation as to Plavix     with the patient's recurrent cerebrovascular accidents. 2. Elevated troponin.  The patient does not have any chest pain or any     acute dyspnea at that time.  Cardiology consult was obtained in the     emergency room and recommended 2D echocardiogram and resume aspirin     with statin.  Plavix may be a better option for the patient  given     possibility of coronary artery disease and recurrent     cerebrovascular accidents, however, we will await further     recommendations from Neurology Service about Plavix. 3. Hypertension, currently stable.  Continue all p.o.     antihypertensives. 4. History of pacemaker.  The patient's pacemaker to [employer] was     interrogated and currently pacing threshold was in safety margin     with no significant episodes of arrhythmias. 5. Prophylaxis.  Lovenox for DVT prophylaxis.  CODE STATUS:  I discussed in detail with the patient and her daughter-in- law present in the room.  The patient stated that she has the code status addressed in the living will, which is not available to me at this time.  For now, we will continue to keep her full code status until the  advanced directives with her son will be bringing.     Thad Ranger, MD     RR/MEDQ  D:  07/06/2011  T:  07/06/2011  Job:  [id number]  cc:   Corwin Levins, MD Pramod P. Pearlean Brownie, MD  Electronically Signed by Andres Labrum Eluzer [place]  on 07/19/2011 01:37:21 PM

## 2011-07-25 ENCOUNTER — Ambulatory Visit (INDEPENDENT_AMBULATORY_CARE_PROVIDER_SITE_OTHER): Payer: Medicare Other | Admitting: Physician Assistant

## 2011-07-25 ENCOUNTER — Encounter: Payer: Self-pay | Admitting: Physician Assistant

## 2011-07-25 VITALS — BP 146/82 | HR 72 | Ht 64.0 in | Wt 129.0 lb

## 2011-07-25 DIAGNOSIS — R748 Abnormal levels of other serum enzymes: Secondary | ICD-10-CM

## 2011-07-25 DIAGNOSIS — I442 Atrioventricular block, complete: Secondary | ICD-10-CM

## 2011-07-25 NOTE — Progress Notes (Signed)
History of Present Illness: Primary Electrophysiologist:  Dr. Lewayne Bunting   [name] is a 77 y.o. female who presents for post hospital follow up.  She has a h/o CHB, s/p PPM, a h/o HTN and CVA.  She was admitted to [facility] 10/10-10/[facility] with falls in the setting of TIA.  She was noted to have elevated cardiac markers (CKMB 12.2, 9.2, 7.4 and TnI 3.27, 2.94, 2.89).  Cardiology saw her due to this and noted she had no clinical symptoms of ischemia.  Echo was done 07/07/11: mod LVH, EF 55-60%, grade 1 diast dysfxn, mild MR, mild LAE, mod TR, PASP 39.  No further cardiac workup was pursued.  She was followed by neurology.  Carotid dopplers were negative for ICA stenosis.  She was d/c to [facility].  She is working with PT and is getting stronger.  She has dementia.  She is very pleasant. She has no complaints.  No chest pain, dyspnea, syncope, orthopnea, PND or edema.  She is here with her daughter who does not note any significant problems recently.  Past Medical History  Diagnosis Date  . Aphasia 01/09/2009  . 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 CEREBROVASCULAR ACCIDENT, HX OF 11/20/2007  . CVA 01/09/2009  . DEMENTIA 05/19/2008  . DIVERTICULOSIS, COLON 11/20/2007  . FATIGUE 11/20/2007  . GLUCOSE INTOLERANCE 12/06/2010  . HYPERLIPIDEMIA 11/20/2007  . HYPERTENSION 11/20/2007    Echo was done 07/07/11: mod LVH, EF 55-60%, grade 1 diast dysfxn, mild MR, mild LAE, mod TR, PASP 39  . HYPOTHYROIDISM 11/20/2007  . IRON DEFICIENCY 12/06/2010  . PACEMAKER, PERMANENT 01/09/2009  . TIA 05/14/2003    elevated CE's in setting of TIA and falls in 10/12 - echo normal with no further cardiac w/u pursued  . Urinary incontinence 01/09/2009  . Impaired glucose tolerance 06/03/2011    Current Outpatient Prescriptions  Medication Sig Dispense Refill  . AGGRENOX 25-200 MG per 12 hr capsule take 1 capsule by mouth twice a day  60 capsule  11  . amLODipine (NORVASC) 5 MG tablet Take 10 mg by mouth daily.       Marland Kitchen  atorvastatin (LIPITOR) 10 MG tablet Take 10 mg by mouth daily.        . Calcium Carbonate-Vitamin D (CALTRATE 600+D) 600-400 MG-UNIT per tablet Take 1 tablet by mouth daily.        Marland Kitchen docusate sodium (COLACE) 100 MG capsule Take 100 mg by mouth 2 (two) times daily.        Marland Kitchen donepezil (ARICEPT) 10 MG tablet Take 10 mg by mouth daily.        Marland Kitchen estradiol (ESTRACE) 0.1 MG/GM vaginal cream Apply two times per week       . folic acid-pyridoxine-cyancobalamin (FOLTX) 2.5-25-2 MG TABS Take 1 tablet by mouth daily.        . hydrochlorothiazide (,MICROZIDE/HYDRODIURIL,) 12.5 MG capsule Take 12.5 mg by mouth daily.        Marland Kitchen ketorolac (ACULAR) 0.4 % SOLN 1 drop 4 (four) times daily.        Marland Kitchen levothyroxine (SYNTHROID, LEVOTHROID) 112 MCG tablet Take 112 mcg by mouth daily.      . Polyethylene Glycol 3350 (MIRALAX PO) Take by mouth.        . prednisoLONE acetate (PRED FORTE) 1 % ophthalmic suspension 1 drop 4 (four) times daily.        . valsartan (DIOVAN) 320 MG tablet Take 320 mg by mouth daily.  Allergies: Allergies  Allergen Reactions  . Codeine     Vital Signs: BP 146/82  Pulse 72  Ht 5\' 4"  (1.626 m)  Wt 129 lb (58.514 kg)  BMI 22.14 kg/m2  PHYSICAL EXAM: Well nourished, well developed, in no acute distress HEENT: normal Neck: no JVD Cardiac:  normal S1, S2; RRR; no murmur Lungs:  clear to auscultation bilaterally, no wheezing, rhonchi or rales Abd: soft, nontender, no hepatomegaly Ext: no edema Skin: warm and dry Neuro:  CNs 2-12 intact, no focal abnormalities noted  EKG:  NSR, V paced, HR 72  ASSESSMENT AND PLAN:

## 2011-07-25 NOTE — Assessment & Plan Note (Signed)
No clinical symptoms of ischemia.  She had a normal echo.  Recommend no further work up.  Discussed with daughter and patient and they agree.

## 2011-07-25 NOTE — Assessment & Plan Note (Signed)
Follow up with Dr. Ladona Ridgel in December for pacer check as previously scheduled.

## 2011-07-25 NOTE — Patient Instructions (Signed)
Your physician recommends that you schedule a follow-up appointment in: December as scheduled

## 2011-08-30 ENCOUNTER — Ambulatory Visit (INDEPENDENT_AMBULATORY_CARE_PROVIDER_SITE_OTHER): Payer: Medicare Other | Admitting: Internal Medicine

## 2011-08-30 ENCOUNTER — Encounter: Payer: Self-pay | Admitting: Internal Medicine

## 2011-08-30 DIAGNOSIS — E785 Hyperlipidemia, unspecified: Secondary | ICD-10-CM

## 2011-08-30 DIAGNOSIS — I442 Atrioventricular block, complete: Secondary | ICD-10-CM

## 2011-08-30 DIAGNOSIS — I10 Essential (primary) hypertension: Secondary | ICD-10-CM

## 2011-08-30 DIAGNOSIS — Z950 Presence of cardiac pacemaker: Secondary | ICD-10-CM

## 2011-08-30 LAB — PACEMAKER DEVICE OBSERVATION
AL AMPLITUDE: 4 mv
AL IMPEDENCE PM: 637 Ohm
BATTERY VOLTAGE: 2.8 V
RV LEAD AMPLITUDE: 11.2 mv

## 2011-08-30 NOTE — Patient Instructions (Signed)
Your physician wants you to follow-up in: 12 months with Dr Taylor You will receive a reminder letter in the mail two months in advance. If you don't receive a letter, please call our office to schedule the follow-up appointment.  Remote monitoring is used to monitor your Pacemaker of ICD from home. This monitoring reduces the number of office visits required to check your device to one time per year. It allows us to keep an eye on the functioning of your device to ensure it is working properly. You are scheduled for a device check from home on 12/01/2011. You may send your transmission at any time that day. If you have a wireless device, the transmission will be sent automatically. After your physician reviews your transmission, you will receive a postcard with your next transmission date.   

## 2011-08-30 NOTE — Assessment & Plan Note (Signed)
She will continue her current medical therapy and maintain a low fat diet. 

## 2011-08-30 NOTE — Progress Notes (Signed)
HPI Mrs. Bump returns today for followup. She is a very pleasant 77 year old woman with dementia, status post stroke, symptomatic bradycardia, status post permanent pacemaker insertion. She has hypertension. She has done well in the interim except for a fall that she experienced just over a month ago. She does have emotional lability after her stroke. She denies chest pain or shortness of breath or peripheral edema. Allergies  Allergen Reactions  . Codeine      Current Outpatient Prescriptions  Medication Sig Dispense Refill  . AGGRENOX 25-200 MG per 12 hr capsule take 1 capsule by mouth twice a day  60 capsule  11  . amLODipine (NORVASC) 5 MG tablet Take 10 mg by mouth daily.       Marland Kitchen atorvastatin (LIPITOR) 10 MG tablet Take 10 mg by mouth daily.        . Calcium Carbonate-Vitamin D (CALTRATE 600+D) 600-400 MG-UNIT per tablet Take 1 tablet by mouth daily.        Marland Kitchen donepezil (ARICEPT) 10 MG tablet Take 10 mg by mouth daily.        Marland Kitchen estradiol (ESTRACE) 0.1 MG/GM vaginal cream Apply two times per week       . folic acid-pyridoxine-cyancobalamin (FOLTX) 2.5-25-2 MG TABS Take 1 tablet by mouth daily.        . hydrochlorothiazide (,MICROZIDE/HYDRODIURIL,) 12.5 MG capsule Take 12.5 mg by mouth daily.        Marland Kitchen levothyroxine (SYNTHROID, LEVOTHROID) 112 MCG tablet Take 112 mcg by mouth daily.      . Polyethylene Glycol 3350 (MIRALAX PO) Take by mouth daily.       . valsartan (DIOVAN) 320 MG tablet Take 320 mg by mouth daily.           Past Medical History  Diagnosis Date  . Aphasia 01/09/2009  . 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 CEREBROVASCULAR ACCIDENT, HX OF 11/20/2007  . CVA 01/09/2009  . DEMENTIA 05/19/2008  . DIVERTICULOSIS, COLON 11/20/2007  . FATIGUE 11/20/2007  . GLUCOSE INTOLERANCE 12/06/2010  . HYPERLIPIDEMIA 11/20/2007  . HYPERTENSION 11/20/2007    Echo was done 07/07/11: mod LVH, EF 55-60%, grade 1 diast dysfxn, mild MR, mild LAE, mod TR, PASP 39  . HYPOTHYROIDISM 11/20/2007    . IRON DEFICIENCY 12/06/2010  . PACEMAKER, PERMANENT 01/09/2009  . TIA 05/14/2003    elevated CE's in setting of TIA and falls in 10/12 - echo normal with no further cardiac w/u pursued  . Urinary incontinence 01/09/2009  . Impaired glucose tolerance 06/03/2011    ROS:   All systems reviewed and negative except as noted in the HPI.   Past Surgical History  Procedure Date  . Medtronic cynthia dual chamber pacemaker serial number was [id number]...04/15/2009   . Abdominal hysterectomy   . S/p cerebral aneurysm 1991  . S/p pacemaker ddd   . Cholecystectomy   . S/p retinal attachment   . S/p thyroidectomy      Family History  Problem Relation Age of Onset  . Cancer Mother     breast cancer     History   Social History  . Marital Status: Divorced    Spouse Name: N/A    Number of Children: N/A  . Years of Education: N/A   Occupational History  . retired Catering manager, lives at [employer]    Social History Main Topics  . Smoking status: Never Smoker   . Smokeless tobacco: Not on file  . Alcohol Use: No  . Drug Use: No  .  Sexually Active: Not on file   Other Topics Concern  . Not on file   Social History Narrative  . No narrative on file     BP 126/62  Pulse 75  Ht 5\' 5"  (1.651 m)  Wt 60.328 kg (133 lb)  BMI 22.13 kg/m2  Physical Exam:  Well appearing elderly woman, NAD HEENT: Unremarkable Neck:  No JVD, no thyromegally Lungs:  Clear with no wheezes, rales, or rhonchi. Well-healed pacemaker incision. HEART:  Regular rate rhythm, no murmurs, no rubs, no clicks Abd:  soft, positive bowel sounds, no organomegally, no rebound, no guarding Ext:  2 plus pulses, no edema, no cyanosis, no clubbing Skin:  No rashes no nodules Neuro:  CN II through XII intact, motor grossly intact  DEVICE  Normal device function.  See PaceArt for details.    Assess/Plan:

## 2011-08-30 NOTE — Assessment & Plan Note (Signed)
Her blood pressure today is well controlled. She will continue her current medical therapy and maintain a low-sodium diet. 

## 2011-08-30 NOTE — Assessment & Plan Note (Signed)
Her device is working normally. We'll plan to recheck in several months. 

## 2011-09-19 ENCOUNTER — Encounter: Payer: Self-pay | Admitting: Internal Medicine

## 2011-09-29 DIAGNOSIS — R279 Unspecified lack of coordination: Secondary | ICD-10-CM | POA: Diagnosis not present

## 2011-09-29 DIAGNOSIS — M6281 Muscle weakness (generalized): Secondary | ICD-10-CM | POA: Diagnosis not present

## 2011-09-29 DIAGNOSIS — Z9181 History of falling: Secondary | ICD-10-CM | POA: Diagnosis not present

## 2011-09-29 DIAGNOSIS — R269 Unspecified abnormalities of gait and mobility: Secondary | ICD-10-CM | POA: Diagnosis not present

## 2011-09-30 DIAGNOSIS — R279 Unspecified lack of coordination: Secondary | ICD-10-CM | POA: Diagnosis not present

## 2011-09-30 DIAGNOSIS — M6281 Muscle weakness (generalized): Secondary | ICD-10-CM | POA: Diagnosis not present

## 2011-09-30 DIAGNOSIS — Z9181 History of falling: Secondary | ICD-10-CM | POA: Diagnosis not present

## 2011-09-30 DIAGNOSIS — R269 Unspecified abnormalities of gait and mobility: Secondary | ICD-10-CM | POA: Diagnosis not present

## 2011-10-03 DIAGNOSIS — R279 Unspecified lack of coordination: Secondary | ICD-10-CM | POA: Diagnosis not present

## 2011-10-03 DIAGNOSIS — Z9181 History of falling: Secondary | ICD-10-CM | POA: Diagnosis not present

## 2011-10-03 DIAGNOSIS — R269 Unspecified abnormalities of gait and mobility: Secondary | ICD-10-CM | POA: Diagnosis not present

## 2011-10-03 DIAGNOSIS — M6281 Muscle weakness (generalized): Secondary | ICD-10-CM | POA: Diagnosis not present

## 2011-10-06 DIAGNOSIS — R279 Unspecified lack of coordination: Secondary | ICD-10-CM | POA: Diagnosis not present

## 2011-10-06 DIAGNOSIS — Z9181 History of falling: Secondary | ICD-10-CM | POA: Diagnosis not present

## 2011-10-06 DIAGNOSIS — M6281 Muscle weakness (generalized): Secondary | ICD-10-CM | POA: Diagnosis not present

## 2011-10-06 DIAGNOSIS — R269 Unspecified abnormalities of gait and mobility: Secondary | ICD-10-CM | POA: Diagnosis not present

## 2011-10-10 ENCOUNTER — Other Ambulatory Visit (INDEPENDENT_AMBULATORY_CARE_PROVIDER_SITE_OTHER): Payer: Medicare Other

## 2011-10-10 ENCOUNTER — Ambulatory Visit (INDEPENDENT_AMBULATORY_CARE_PROVIDER_SITE_OTHER): Payer: Medicare Other | Admitting: Internal Medicine

## 2011-10-10 ENCOUNTER — Encounter: Payer: Self-pay | Admitting: Internal Medicine

## 2011-10-10 VITALS — BP 120/70 | HR 85 | Temp 97.9°F | Ht 64.0 in | Wt 134.1 lb

## 2011-10-10 DIAGNOSIS — M6281 Muscle weakness (generalized): Secondary | ICD-10-CM | POA: Diagnosis not present

## 2011-10-10 DIAGNOSIS — I10 Essential (primary) hypertension: Secondary | ICD-10-CM

## 2011-10-10 DIAGNOSIS — G459 Transient cerebral ischemic attack, unspecified: Secondary | ICD-10-CM | POA: Diagnosis not present

## 2011-10-10 DIAGNOSIS — E039 Hypothyroidism, unspecified: Secondary | ICD-10-CM

## 2011-10-10 DIAGNOSIS — R279 Unspecified lack of coordination: Secondary | ICD-10-CM | POA: Diagnosis not present

## 2011-10-10 DIAGNOSIS — Z9181 History of falling: Secondary | ICD-10-CM | POA: Diagnosis not present

## 2011-10-10 DIAGNOSIS — R269 Unspecified abnormalities of gait and mobility: Secondary | ICD-10-CM | POA: Diagnosis not present

## 2011-10-10 HISTORY — DX: Transient cerebral ischemic attack, unspecified: G45.9

## 2011-10-10 MED ORDER — VALSARTAN 320 MG PO TABS: 320.0000 mg | ORAL_TABLET | Freq: Every day | ORAL | Status: DC

## 2011-10-10 MED ORDER — HYDROCHLOROTHIAZIDE 12.5 MG PO CAPS: 12.5000 mg | ORAL_CAPSULE | Freq: Every day | ORAL | Status: DC

## 2011-10-10 MED ORDER — DONEPEZIL HCL 10 MG PO TABS: 10.0000 mg | ORAL_TABLET | Freq: Every day | ORAL | Status: DC

## 2011-10-10 MED ORDER — ASPIRIN-DIPYRIDAMOLE ER 25-200 MG PO CP12: 1.0000 | ORAL_CAPSULE | Freq: Two times a day (BID) | ORAL | Status: DC

## 2011-10-10 MED ORDER — ESTRADIOL 0.1 MG/GM VA CREA: TOPICAL_CREAM | VAGINAL | Status: DC

## 2011-10-10 MED ORDER — ATORVASTATIN CALCIUM 10 MG PO TABS: 10.0000 mg | ORAL_TABLET | Freq: Every day | ORAL | Status: DC

## 2011-10-10 MED ORDER — AMLODIPINE BESYLATE 10 MG PO TABS: 10.0000 mg | ORAL_TABLET | Freq: Every day | ORAL | Status: DC

## 2011-10-10 MED ORDER — LEVOTHYROXINE SODIUM 125 MCG PO TABS: 125.0000 ug | ORAL_TABLET | Freq: Every day | ORAL | Status: DC

## 2011-10-10 MED ORDER — FA-PYRIDOXINE-CYANOCOBALAMIN 2.5-25-2 MG PO TABS: 1.0000 | ORAL_TABLET | Freq: Every day | ORAL | Status: DC

## 2011-10-10 NOTE — Assessment & Plan Note (Signed)
stable overall by hx and exam, most recent data reviewed with pt, and pt to continue medical treatment as before Lab Results  Component Value Date   TSH 4.39 10/10/2011  for tsh today

## 2011-10-10 NOTE — Patient Instructions (Signed)
No new medications today Continue all other medications as before Please go to LAB in the Basement for the blood and/or urine tests to be done today Please call the phone number [phone] (the PhoneTree System) for results of testing in 2-3 days;  When calling, simply dial the number, and when prompted enter the MRN number above (the Medical Record Number) and the # key, then the message should start.   OK to cancel the March 2013 appt  Please return in 6 months, or sooner if needed

## 2011-10-10 NOTE — Progress Notes (Signed)
Subjective:    Patient ID: [name], female    DOB: 10-22-1921, 78 y.o.   MRN: [id number]  HPI  Here with supportive granddaughter as per usual;  Currently living in independent living but checked by family and other daily;  Pt with memory loss but "determined" to stay active and has excellent attitude, able to accomplish ADL's such as bathing without assist and groomed nicely today;  unfort had TIA with fall oct 2012 with hospn then subsequent rehab for 5 wks with good enough progression to get back to prior level of function as above, though has some balance difficulty ongoing, walks with walker.  No falls since then, has seen card and neuro/DrSethi since hosp d/c, no med changes, though diovan and norvasc has been increased, as well as slighty increaed synthroid, due for f/u TSH today.  Denies hyper or hypo thyroid symptoms such as voice, skin or hair change.   Pt denies fever, wt loss, night sweats, loss of appetite, or other constitutional symptoms   Denies urinary symptoms such as dysuria, frequency, urgency,or hematuria.  Still getting PT twice wkly at [facility]. Past Medical History  Diagnosis Date  . Aphasia 01/09/2009  . 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 CEREBROVASCULAR ACCIDENT, HX OF 11/20/2007  . CVA 01/09/2009  . DEMENTIA 05/19/2008  . DIVERTICULOSIS, COLON 11/20/2007  . FATIGUE 11/20/2007  . GLUCOSE INTOLERANCE 12/06/2010  . HYPERLIPIDEMIA 11/20/2007  . HYPERTENSION 11/20/2007    Echo was done 07/07/11: mod LVH, EF 55-60%, grade 1 diast dysfxn, mild MR, mild LAE, mod TR, PASP 39  . HYPOTHYROIDISM 11/20/2007  . IRON DEFICIENCY 12/06/2010  . PACEMAKER, PERMANENT 01/09/2009  . TIA 05/14/2003    elevated CE's in setting of TIA and falls in 10/12 - echo normal with no further cardiac w/u pursued  . Urinary incontinence 01/09/2009  . Impaired glucose tolerance 06/03/2011  . TIA (transient ischemic attack) 10/10/2011   Past Surgical History  Procedure Date  . Medtronic cynthia dual  chamber pacemaker serial number was [id number]...04/15/2009   . Abdominal hysterectomy   . S/p cerebral aneurysm 1991  . S/p pacemaker ddd   . Cholecystectomy   . S/p retinal attachment   . S/p thyroidectomy     reports that she has never smoked. She does not have any smokeless tobacco history on file. She reports that she does not drink alcohol or use illicit drugs. family history includes Cancer in her mother. Allergies  Allergen Reactions  . Codeine    Current Outpatient Prescriptions on File Prior to Visit  Medication Sig Dispense Refill  . Calcium Carbonate-Vitamin D (CALTRATE 600+D) 600-400 MG-UNIT per tablet Take 1 tablet by mouth daily.        . Polyethylene Glycol 3350 (MIRALAX PO) Take by mouth daily.        Review of Systems Review of Systems  Constitutional: Negative for diaphoresis and unexpected weight change.  HENT: Negative for drooling and tinnitus.   Eyes: Negative for photophobia and visual disturbance.  Respiratory: Negative for choking and stridor.   Gastrointestinal: Negative for vomiting and blood in stool.  Genitourinary: Negative for hematuria and decreased urine volume.    Objective:   Physical Exam BP 120/70  Pulse 85  Temp(Src) 97.9 F (36.6 C) (Oral)  Ht 5\' 4"  (1.626 m)  Wt 134 lb 2 oz (60.839 kg)  BMI 23.02 kg/m2  SpO2 95% Physical Exam  VS noted Constitutional: Pt appears well-developed and well-nourished.  HENT: Head: Normocephalic.  Right Ear: External ear  normal.  Left Ear: External ear normal.  Eyes: Conjunctivae and EOM are normal. Pupils are equal, round, and reactive to light.  Neck: Normal range of motion. Neck supple.  Cardiovascular: Normal rate and regular rhythm.   Pulmonary/Chest: Effort normal and breath sounds normal.  Abd:  Soft, NT, non-distended, + BS Neurological: Pt is alert. No cranial nerve deficit.  Skin: Skin is warm. No erythema.  Psychiatric: Pt behavior is normal. Thought content normal.     Assessment & Plan:

## 2011-10-10 NOTE — Assessment & Plan Note (Signed)
stable overall by hx and exam, most recent data reviewed with pt, and pt to continue medical treatment as before Lab Results  Component Value Date   WBC 8.1 07/10/2011   HGB 11.8* 07/10/2011   HCT 34.5* 07/10/2011   PLT 296 07/10/2011   GLUCOSE 125* 07/10/2011   CHOL 123 07/08/2011   TRIG 91 07/08/2011   HDL 44 07/08/2011   LDLDIRECT 73.4 11/18/2009   LDLCALC 61 07/08/2011   ALT 17 07/06/2011   AST 28 07/06/2011   NA 136 07/10/2011   K 3.7 07/10/2011   CL 100 07/10/2011   CREATININE 0.76 07/10/2011   BUN 19 07/10/2011   CO2 25 07/10/2011   TSH 4.39 10/10/2011   INR 1.06 07/06/2011   HGBA1C 6.1* 07/06/2011

## 2011-10-10 NOTE — Assessment & Plan Note (Signed)
stable overall by hx and exam, most recent data reviewed with pt, and pt to continue medical treatment as before  BP Readings from Last 3 Encounters:  10/10/11 120/70  08/30/11 126/62  07/25/11 146/82

## 2011-10-12 DIAGNOSIS — R279 Unspecified lack of coordination: Secondary | ICD-10-CM | POA: Diagnosis not present

## 2011-10-12 DIAGNOSIS — R269 Unspecified abnormalities of gait and mobility: Secondary | ICD-10-CM | POA: Diagnosis not present

## 2011-10-12 DIAGNOSIS — M6281 Muscle weakness (generalized): Secondary | ICD-10-CM | POA: Diagnosis not present

## 2011-10-12 DIAGNOSIS — Z9181 History of falling: Secondary | ICD-10-CM | POA: Diagnosis not present

## 2011-10-14 DIAGNOSIS — R279 Unspecified lack of coordination: Secondary | ICD-10-CM | POA: Diagnosis not present

## 2011-10-14 DIAGNOSIS — R269 Unspecified abnormalities of gait and mobility: Secondary | ICD-10-CM | POA: Diagnosis not present

## 2011-10-14 DIAGNOSIS — Z9181 History of falling: Secondary | ICD-10-CM | POA: Diagnosis not present

## 2011-10-14 DIAGNOSIS — M6281 Muscle weakness (generalized): Secondary | ICD-10-CM | POA: Diagnosis not present

## 2011-10-17 DIAGNOSIS — R269 Unspecified abnormalities of gait and mobility: Secondary | ICD-10-CM | POA: Diagnosis not present

## 2011-10-17 DIAGNOSIS — Z9181 History of falling: Secondary | ICD-10-CM | POA: Diagnosis not present

## 2011-10-17 DIAGNOSIS — M6281 Muscle weakness (generalized): Secondary | ICD-10-CM | POA: Diagnosis not present

## 2011-10-17 DIAGNOSIS — R279 Unspecified lack of coordination: Secondary | ICD-10-CM | POA: Diagnosis not present

## 2011-10-20 DIAGNOSIS — R279 Unspecified lack of coordination: Secondary | ICD-10-CM | POA: Diagnosis not present

## 2011-10-20 DIAGNOSIS — R269 Unspecified abnormalities of gait and mobility: Secondary | ICD-10-CM | POA: Diagnosis not present

## 2011-10-20 DIAGNOSIS — M6281 Muscle weakness (generalized): Secondary | ICD-10-CM | POA: Diagnosis not present

## 2011-10-20 DIAGNOSIS — Z9181 History of falling: Secondary | ICD-10-CM | POA: Diagnosis not present

## 2011-10-24 DIAGNOSIS — M6281 Muscle weakness (generalized): Secondary | ICD-10-CM | POA: Diagnosis not present

## 2011-10-24 DIAGNOSIS — R269 Unspecified abnormalities of gait and mobility: Secondary | ICD-10-CM | POA: Diagnosis not present

## 2011-10-24 DIAGNOSIS — Z9181 History of falling: Secondary | ICD-10-CM | POA: Diagnosis not present

## 2011-10-24 DIAGNOSIS — R279 Unspecified lack of coordination: Secondary | ICD-10-CM | POA: Diagnosis not present

## 2011-10-27 DIAGNOSIS — Z9181 History of falling: Secondary | ICD-10-CM | POA: Diagnosis not present

## 2011-10-27 DIAGNOSIS — R269 Unspecified abnormalities of gait and mobility: Secondary | ICD-10-CM | POA: Diagnosis not present

## 2011-10-27 DIAGNOSIS — R279 Unspecified lack of coordination: Secondary | ICD-10-CM | POA: Diagnosis not present

## 2011-10-27 DIAGNOSIS — M6281 Muscle weakness (generalized): Secondary | ICD-10-CM | POA: Diagnosis not present

## 2011-10-28 DIAGNOSIS — R279 Unspecified lack of coordination: Secondary | ICD-10-CM | POA: Diagnosis not present

## 2011-10-28 DIAGNOSIS — R269 Unspecified abnormalities of gait and mobility: Secondary | ICD-10-CM | POA: Diagnosis not present

## 2011-10-28 DIAGNOSIS — Z9181 History of falling: Secondary | ICD-10-CM | POA: Diagnosis not present

## 2011-10-28 DIAGNOSIS — M6281 Muscle weakness (generalized): Secondary | ICD-10-CM | POA: Diagnosis not present

## 2011-11-02 DIAGNOSIS — N8111 Cystocele, midline: Secondary | ICD-10-CM | POA: Diagnosis not present

## 2011-11-02 DIAGNOSIS — N816 Rectocele: Secondary | ICD-10-CM | POA: Diagnosis not present

## 2011-11-25 DIAGNOSIS — Z9181 History of falling: Secondary | ICD-10-CM | POA: Diagnosis not present

## 2011-11-25 DIAGNOSIS — R269 Unspecified abnormalities of gait and mobility: Secondary | ICD-10-CM | POA: Diagnosis not present

## 2011-11-25 DIAGNOSIS — M6281 Muscle weakness (generalized): Secondary | ICD-10-CM | POA: Diagnosis not present

## 2011-11-25 DIAGNOSIS — R279 Unspecified lack of coordination: Secondary | ICD-10-CM | POA: Diagnosis not present

## 2011-11-29 DIAGNOSIS — M6281 Muscle weakness (generalized): Secondary | ICD-10-CM | POA: Diagnosis not present

## 2011-11-29 DIAGNOSIS — R269 Unspecified abnormalities of gait and mobility: Secondary | ICD-10-CM | POA: Diagnosis not present

## 2011-11-29 DIAGNOSIS — Z9181 History of falling: Secondary | ICD-10-CM | POA: Diagnosis not present

## 2011-11-29 DIAGNOSIS — R279 Unspecified lack of coordination: Secondary | ICD-10-CM | POA: Diagnosis not present

## 2011-11-30 DIAGNOSIS — Z9181 History of falling: Secondary | ICD-10-CM | POA: Diagnosis not present

## 2011-11-30 DIAGNOSIS — R279 Unspecified lack of coordination: Secondary | ICD-10-CM | POA: Diagnosis not present

## 2011-11-30 DIAGNOSIS — M6281 Muscle weakness (generalized): Secondary | ICD-10-CM | POA: Diagnosis not present

## 2011-11-30 DIAGNOSIS — R269 Unspecified abnormalities of gait and mobility: Secondary | ICD-10-CM | POA: Diagnosis not present

## 2011-12-01 ENCOUNTER — Ambulatory Visit (INDEPENDENT_AMBULATORY_CARE_PROVIDER_SITE_OTHER): Payer: Medicare Other | Admitting: *Deleted

## 2011-12-01 ENCOUNTER — Encounter: Payer: Self-pay | Admitting: Internal Medicine

## 2011-12-01 DIAGNOSIS — Z9181 History of falling: Secondary | ICD-10-CM | POA: Diagnosis not present

## 2011-12-01 DIAGNOSIS — I442 Atrioventricular block, complete: Secondary | ICD-10-CM

## 2011-12-01 DIAGNOSIS — Z950 Presence of cardiac pacemaker: Secondary | ICD-10-CM | POA: Diagnosis not present

## 2011-12-01 DIAGNOSIS — M6281 Muscle weakness (generalized): Secondary | ICD-10-CM | POA: Diagnosis not present

## 2011-12-01 DIAGNOSIS — R279 Unspecified lack of coordination: Secondary | ICD-10-CM | POA: Diagnosis not present

## 2011-12-01 DIAGNOSIS — R269 Unspecified abnormalities of gait and mobility: Secondary | ICD-10-CM | POA: Diagnosis not present

## 2011-12-02 DIAGNOSIS — M6281 Muscle weakness (generalized): Secondary | ICD-10-CM | POA: Diagnosis not present

## 2011-12-02 DIAGNOSIS — R279 Unspecified lack of coordination: Secondary | ICD-10-CM | POA: Diagnosis not present

## 2011-12-02 DIAGNOSIS — R269 Unspecified abnormalities of gait and mobility: Secondary | ICD-10-CM | POA: Diagnosis not present

## 2011-12-02 DIAGNOSIS — Z9181 History of falling: Secondary | ICD-10-CM | POA: Diagnosis not present

## 2011-12-02 LAB — REMOTE PACEMAKER DEVICE
ATRIAL PACING PM: 46
BAMS-0001: 175 {beats}/min
BATTERY VOLTAGE: 2.79 V
RV LEAD IMPEDENCE PM: 599 Ohm
VENTRICULAR PACING PM: 100

## 2011-12-05 DIAGNOSIS — R279 Unspecified lack of coordination: Secondary | ICD-10-CM | POA: Diagnosis not present

## 2011-12-05 DIAGNOSIS — Z9181 History of falling: Secondary | ICD-10-CM | POA: Diagnosis not present

## 2011-12-05 DIAGNOSIS — R269 Unspecified abnormalities of gait and mobility: Secondary | ICD-10-CM | POA: Diagnosis not present

## 2011-12-05 DIAGNOSIS — M6281 Muscle weakness (generalized): Secondary | ICD-10-CM | POA: Diagnosis not present

## 2011-12-06 ENCOUNTER — Ambulatory Visit: Payer: Medicare Other | Admitting: Internal Medicine

## 2011-12-06 DIAGNOSIS — Z9181 History of falling: Secondary | ICD-10-CM | POA: Diagnosis not present

## 2011-12-06 DIAGNOSIS — M6281 Muscle weakness (generalized): Secondary | ICD-10-CM | POA: Diagnosis not present

## 2011-12-06 DIAGNOSIS — R269 Unspecified abnormalities of gait and mobility: Secondary | ICD-10-CM | POA: Diagnosis not present

## 2011-12-06 DIAGNOSIS — R279 Unspecified lack of coordination: Secondary | ICD-10-CM | POA: Diagnosis not present

## 2011-12-07 DIAGNOSIS — Z9181 History of falling: Secondary | ICD-10-CM | POA: Diagnosis not present

## 2011-12-07 DIAGNOSIS — R269 Unspecified abnormalities of gait and mobility: Secondary | ICD-10-CM | POA: Diagnosis not present

## 2011-12-07 DIAGNOSIS — M6281 Muscle weakness (generalized): Secondary | ICD-10-CM | POA: Diagnosis not present

## 2011-12-07 DIAGNOSIS — R279 Unspecified lack of coordination: Secondary | ICD-10-CM | POA: Diagnosis not present

## 2011-12-07 NOTE — Progress Notes (Signed)
Remote pacer check  

## 2011-12-08 DIAGNOSIS — Z9181 History of falling: Secondary | ICD-10-CM | POA: Diagnosis not present

## 2011-12-08 DIAGNOSIS — R279 Unspecified lack of coordination: Secondary | ICD-10-CM | POA: Diagnosis not present

## 2011-12-08 DIAGNOSIS — M6281 Muscle weakness (generalized): Secondary | ICD-10-CM | POA: Diagnosis not present

## 2011-12-08 DIAGNOSIS — R269 Unspecified abnormalities of gait and mobility: Secondary | ICD-10-CM | POA: Diagnosis not present

## 2011-12-12 DIAGNOSIS — M6281 Muscle weakness (generalized): Secondary | ICD-10-CM | POA: Diagnosis not present

## 2011-12-12 DIAGNOSIS — Z9181 History of falling: Secondary | ICD-10-CM | POA: Diagnosis not present

## 2011-12-12 DIAGNOSIS — R269 Unspecified abnormalities of gait and mobility: Secondary | ICD-10-CM | POA: Diagnosis not present

## 2011-12-12 DIAGNOSIS — R279 Unspecified lack of coordination: Secondary | ICD-10-CM | POA: Diagnosis not present

## 2011-12-14 DIAGNOSIS — R269 Unspecified abnormalities of gait and mobility: Secondary | ICD-10-CM | POA: Diagnosis not present

## 2011-12-14 DIAGNOSIS — Z9181 History of falling: Secondary | ICD-10-CM | POA: Diagnosis not present

## 2011-12-14 DIAGNOSIS — R279 Unspecified lack of coordination: Secondary | ICD-10-CM | POA: Diagnosis not present

## 2011-12-14 DIAGNOSIS — M6281 Muscle weakness (generalized): Secondary | ICD-10-CM | POA: Diagnosis not present

## 2011-12-15 DIAGNOSIS — Z9181 History of falling: Secondary | ICD-10-CM | POA: Diagnosis not present

## 2011-12-15 DIAGNOSIS — M6281 Muscle weakness (generalized): Secondary | ICD-10-CM | POA: Diagnosis not present

## 2011-12-15 DIAGNOSIS — R279 Unspecified lack of coordination: Secondary | ICD-10-CM | POA: Diagnosis not present

## 2011-12-15 DIAGNOSIS — R269 Unspecified abnormalities of gait and mobility: Secondary | ICD-10-CM | POA: Diagnosis not present

## 2011-12-16 DIAGNOSIS — M6281 Muscle weakness (generalized): Secondary | ICD-10-CM | POA: Diagnosis not present

## 2011-12-16 DIAGNOSIS — Z9181 History of falling: Secondary | ICD-10-CM | POA: Diagnosis not present

## 2011-12-16 DIAGNOSIS — R279 Unspecified lack of coordination: Secondary | ICD-10-CM | POA: Diagnosis not present

## 2011-12-16 DIAGNOSIS — R269 Unspecified abnormalities of gait and mobility: Secondary | ICD-10-CM | POA: Diagnosis not present

## 2011-12-19 DIAGNOSIS — Z9181 History of falling: Secondary | ICD-10-CM | POA: Diagnosis not present

## 2011-12-19 DIAGNOSIS — M6281 Muscle weakness (generalized): Secondary | ICD-10-CM | POA: Diagnosis not present

## 2011-12-19 DIAGNOSIS — R279 Unspecified lack of coordination: Secondary | ICD-10-CM | POA: Diagnosis not present

## 2011-12-19 DIAGNOSIS — R269 Unspecified abnormalities of gait and mobility: Secondary | ICD-10-CM | POA: Diagnosis not present

## 2011-12-20 DIAGNOSIS — R269 Unspecified abnormalities of gait and mobility: Secondary | ICD-10-CM | POA: Diagnosis not present

## 2011-12-20 DIAGNOSIS — Z9181 History of falling: Secondary | ICD-10-CM | POA: Diagnosis not present

## 2011-12-20 DIAGNOSIS — M6281 Muscle weakness (generalized): Secondary | ICD-10-CM | POA: Diagnosis not present

## 2011-12-20 DIAGNOSIS — R279 Unspecified lack of coordination: Secondary | ICD-10-CM | POA: Diagnosis not present

## 2011-12-22 DIAGNOSIS — Z9181 History of falling: Secondary | ICD-10-CM | POA: Diagnosis not present

## 2011-12-22 DIAGNOSIS — R279 Unspecified lack of coordination: Secondary | ICD-10-CM | POA: Diagnosis not present

## 2011-12-22 DIAGNOSIS — M6281 Muscle weakness (generalized): Secondary | ICD-10-CM | POA: Diagnosis not present

## 2011-12-22 DIAGNOSIS — R269 Unspecified abnormalities of gait and mobility: Secondary | ICD-10-CM | POA: Diagnosis not present

## 2011-12-26 ENCOUNTER — Encounter: Payer: Self-pay | Admitting: *Deleted

## 2011-12-28 DIAGNOSIS — M6281 Muscle weakness (generalized): Secondary | ICD-10-CM | POA: Diagnosis not present

## 2011-12-28 DIAGNOSIS — Z9181 History of falling: Secondary | ICD-10-CM | POA: Diagnosis not present

## 2011-12-28 DIAGNOSIS — R279 Unspecified lack of coordination: Secondary | ICD-10-CM | POA: Diagnosis not present

## 2011-12-28 DIAGNOSIS — R269 Unspecified abnormalities of gait and mobility: Secondary | ICD-10-CM | POA: Diagnosis not present

## 2012-01-02 DIAGNOSIS — M6281 Muscle weakness (generalized): Secondary | ICD-10-CM | POA: Diagnosis not present

## 2012-01-02 DIAGNOSIS — R279 Unspecified lack of coordination: Secondary | ICD-10-CM | POA: Diagnosis not present

## 2012-01-02 DIAGNOSIS — Z9181 History of falling: Secondary | ICD-10-CM | POA: Diagnosis not present

## 2012-01-02 DIAGNOSIS — R269 Unspecified abnormalities of gait and mobility: Secondary | ICD-10-CM | POA: Diagnosis not present

## 2012-01-04 DIAGNOSIS — M6281 Muscle weakness (generalized): Secondary | ICD-10-CM | POA: Diagnosis not present

## 2012-01-04 DIAGNOSIS — Z9181 History of falling: Secondary | ICD-10-CM | POA: Diagnosis not present

## 2012-01-04 DIAGNOSIS — R279 Unspecified lack of coordination: Secondary | ICD-10-CM | POA: Diagnosis not present

## 2012-01-04 DIAGNOSIS — R269 Unspecified abnormalities of gait and mobility: Secondary | ICD-10-CM | POA: Diagnosis not present

## 2012-01-05 DIAGNOSIS — R279 Unspecified lack of coordination: Secondary | ICD-10-CM | POA: Diagnosis not present

## 2012-01-05 DIAGNOSIS — R269 Unspecified abnormalities of gait and mobility: Secondary | ICD-10-CM | POA: Diagnosis not present

## 2012-01-05 DIAGNOSIS — Z9181 History of falling: Secondary | ICD-10-CM | POA: Diagnosis not present

## 2012-01-05 DIAGNOSIS — M6281 Muscle weakness (generalized): Secondary | ICD-10-CM | POA: Diagnosis not present

## 2012-01-09 DIAGNOSIS — M6281 Muscle weakness (generalized): Secondary | ICD-10-CM | POA: Diagnosis not present

## 2012-01-09 DIAGNOSIS — R269 Unspecified abnormalities of gait and mobility: Secondary | ICD-10-CM | POA: Diagnosis not present

## 2012-01-09 DIAGNOSIS — R279 Unspecified lack of coordination: Secondary | ICD-10-CM | POA: Diagnosis not present

## 2012-01-09 DIAGNOSIS — Z9181 History of falling: Secondary | ICD-10-CM | POA: Diagnosis not present

## 2012-01-11 DIAGNOSIS — Z9181 History of falling: Secondary | ICD-10-CM | POA: Diagnosis not present

## 2012-01-11 DIAGNOSIS — M6281 Muscle weakness (generalized): Secondary | ICD-10-CM | POA: Diagnosis not present

## 2012-01-11 DIAGNOSIS — R269 Unspecified abnormalities of gait and mobility: Secondary | ICD-10-CM | POA: Diagnosis not present

## 2012-01-11 DIAGNOSIS — R279 Unspecified lack of coordination: Secondary | ICD-10-CM | POA: Diagnosis not present

## 2012-01-31 DIAGNOSIS — N8111 Cystocele, midline: Secondary | ICD-10-CM | POA: Diagnosis not present

## 2012-01-31 DIAGNOSIS — N952 Postmenopausal atrophic vaginitis: Secondary | ICD-10-CM | POA: Diagnosis not present

## 2012-01-31 DIAGNOSIS — N816 Rectocele: Secondary | ICD-10-CM | POA: Diagnosis not present

## 2012-02-29 DIAGNOSIS — N8111 Cystocele, midline: Secondary | ICD-10-CM | POA: Diagnosis not present

## 2012-02-29 DIAGNOSIS — N952 Postmenopausal atrophic vaginitis: Secondary | ICD-10-CM | POA: Diagnosis not present

## 2012-02-29 DIAGNOSIS — N816 Rectocele: Secondary | ICD-10-CM | POA: Diagnosis not present

## 2012-03-01 ENCOUNTER — Ambulatory Visit (INDEPENDENT_AMBULATORY_CARE_PROVIDER_SITE_OTHER): Payer: Medicare Other | Admitting: *Deleted

## 2012-03-01 ENCOUNTER — Encounter: Payer: Self-pay | Admitting: Internal Medicine

## 2012-03-01 DIAGNOSIS — Z950 Presence of cardiac pacemaker: Secondary | ICD-10-CM

## 2012-03-01 DIAGNOSIS — I442 Atrioventricular block, complete: Secondary | ICD-10-CM

## 2012-03-08 LAB — REMOTE PACEMAKER DEVICE
AL AMPLITUDE: 2.8 mv
ATRIAL PACING PM: 43
BAMS-0001: 175 {beats}/min
RV LEAD IMPEDENCE PM: 614 Ohm
RV LEAD THRESHOLD: 0.625 V

## 2012-03-19 ENCOUNTER — Encounter: Payer: Self-pay | Admitting: *Deleted

## 2012-03-28 DIAGNOSIS — I6322 Cerebral infarction due to unspecified occlusion or stenosis of basilar arteries: Secondary | ICD-10-CM | POA: Diagnosis not present

## 2012-03-28 DIAGNOSIS — F0390 Unspecified dementia without behavioral disturbance: Secondary | ICD-10-CM | POA: Diagnosis not present

## 2012-04-02 ENCOUNTER — Telehealth: Payer: Self-pay | Admitting: Internal Medicine

## 2012-04-02 DIAGNOSIS — N816 Rectocele: Secondary | ICD-10-CM | POA: Diagnosis not present

## 2012-04-02 DIAGNOSIS — N8111 Cystocele, midline: Secondary | ICD-10-CM | POA: Diagnosis not present

## 2012-04-02 NOTE — Telephone Encounter (Signed)
Forward 4 pages to Dr. Oliver Barre for review on 04-02-12 ym

## 2012-04-03 DIAGNOSIS — N8111 Cystocele, midline: Secondary | ICD-10-CM | POA: Diagnosis not present

## 2012-04-10 ENCOUNTER — Encounter: Payer: Self-pay | Admitting: Internal Medicine

## 2012-04-10 ENCOUNTER — Other Ambulatory Visit (INDEPENDENT_AMBULATORY_CARE_PROVIDER_SITE_OTHER): Payer: Medicare Other

## 2012-04-10 ENCOUNTER — Ambulatory Visit (INDEPENDENT_AMBULATORY_CARE_PROVIDER_SITE_OTHER): Payer: Medicare Other | Admitting: Internal Medicine

## 2012-04-10 VITALS — BP 132/70 | HR 80 | Temp 97.1°F | Ht 65.0 in | Wt 141.2 lb

## 2012-04-10 DIAGNOSIS — I10 Essential (primary) hypertension: Secondary | ICD-10-CM

## 2012-04-10 DIAGNOSIS — E785 Hyperlipidemia, unspecified: Secondary | ICD-10-CM

## 2012-04-10 DIAGNOSIS — R7302 Impaired glucose tolerance (oral): Secondary | ICD-10-CM

## 2012-04-10 DIAGNOSIS — E039 Hypothyroidism, unspecified: Secondary | ICD-10-CM | POA: Diagnosis not present

## 2012-04-10 DIAGNOSIS — R7309 Other abnormal glucose: Secondary | ICD-10-CM

## 2012-04-10 LAB — CBC WITH DIFFERENTIAL/PLATELET
Basophils Relative: 0.5 % (ref 0.0–3.0)
Eosinophils Relative: 2.1 % (ref 0.0–5.0)
Lymphocytes Relative: 24.3 % (ref 12.0–46.0)
MCV: 94 fl (ref 78.0–100.0)
Monocytes Relative: 11.1 % (ref 3.0–12.0)
Neutrophils Relative %: 62 % (ref 43.0–77.0)
Platelets: 279 10*3/uL (ref 150.0–400.0)
RBC: 4.18 Mil/uL (ref 3.87–5.11)
WBC: 9.7 10*3/uL (ref 4.5–10.5)

## 2012-04-10 LAB — HEMOGLOBIN A1C: Hgb A1c MFr Bld: 6.2 % (ref 4.6–6.5)

## 2012-04-10 NOTE — Patient Instructions (Addendum)
Continue all other medications as before Please have the pharmacy call with any refills you may need. Please go to LAB in the Basement for the blood and/or urine tests to be done today You will be contacted by phone if any changes need to be made immediately.  Otherwise, you will receive a letter about your results with an explanation. Please keep your appointments with your specialists as you have planned Please return in 9 months, or sooner if needed

## 2012-04-10 NOTE — Assessment & Plan Note (Signed)
stable overall by hx and exam, most recent data reviewed with pt, and pt to continue medical treatment as before, for tsh f/u today Lab Results  Component Value Date   TSH 4.39 10/10/2011

## 2012-04-10 NOTE — Assessment & Plan Note (Signed)
stable overall by hx and exam, most recent data reviewed with pt, and pt to continue medical treatment as before Lab Results  Component Value Date   LDLCALC 61 07/08/2011

## 2012-04-10 NOTE — Assessment & Plan Note (Signed)
stable overall by hx and exam, most recent data reviewed with pt, and pt to continue medical treatment as before BP Readings from Last 3 Encounters:  04/10/12 132/70  10/10/11 120/70  08/30/11 126/62

## 2012-04-10 NOTE — Assessment & Plan Note (Signed)
stable overall by hx and exam, most recent data reviewed with pt, and pt to continue medical treatment as before  Lab Results  Component Value Date   HGBA1C 6.1 07/06/2011    

## 2012-04-10 NOTE — Progress Notes (Signed)
Subjective:    Patient ID: [name], female    DOB: 1922/01/08, 78 y.o.   MRN: [id number]  HPI here with grandduaghter who remains supportive over last several yrs.  Pt denies chest pain, increased sob or doe, wheezing, orthopnea, PND, increased LE swelling, palpitations, dizziness or syncope.   Pt denies polydipsia, polyuria.  Pt states overall good compliance with meds, trying to follow lower cholesterol, diabetic diet, wt overall stable.  Walks with walker,  No recent falls, last was oct 2012.  Sees Dr Charisse March on regular basis, last earlier this mo, f/u carotid dopplers ordered.  Pt denies new neurological symptoms such as new headache, or facial or extremity weakness or numbness.  Dementia remains stable, due to CVA - and not assoc with behavioral changes such as hallucinations, paranoia, or agitation.   Has chronic incontincence and uses pessary but not working well.  May need surgury.   Overall no signficant pain issues except menitions mild right wrist pain on use with walker, not assoc with swelling, falls/trauma.  No overt bleeding or bruising.  Appears energetic today, Denies worsening depressive symptoms, suicidal ideation, or panic  Denies hyper or hypo thyroid symptoms such as voice, skin or hair change. Past Medical History  Diagnosis Date  . Aphasia 01/09/2009  . 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 CEREBROVASCULAR ACCIDENT, HX OF 11/20/2007  . CVA 01/09/2009  . DEMENTIA 05/19/2008  . DIVERTICULOSIS, COLON 11/20/2007  . FATIGUE 11/20/2007  . GLUCOSE INTOLERANCE 12/06/2010  . HYPERLIPIDEMIA 11/20/2007  . HYPERTENSION 11/20/2007    Echo was done 07/07/11: mod LVH, EF 55-60%, grade 1 diast dysfxn, mild MR, mild LAE, mod TR, PASP 39  . HYPOTHYROIDISM 11/20/2007  . IRON DEFICIENCY 12/06/2010  . PACEMAKER, PERMANENT 01/09/2009  . TIA 05/14/2003    elevated CE's in setting of TIA and falls in 10/12 - echo normal with no further cardiac w/u pursued  . Urinary incontinence 01/09/2009  .  Impaired glucose tolerance 06/03/2011  . TIA (transient ischemic attack) 10/10/2011   Past Surgical History  Procedure Date  . Medtronic cynthia dual chamber pacemaker serial number was [id number]...04/15/2009   . Abdominal hysterectomy   . S/p cerebral aneurysm 1991  . S/p pacemaker ddd   . Cholecystectomy   . S/p retinal attachment   . S/p thyroidectomy     reports that she has never smoked. She does not have any smokeless tobacco history on file. She reports that she does not drink alcohol or use illicit drugs. family history includes Cancer in her mother. Allergies  Allergen Reactions  . Codeine    Current Outpatient Prescriptions on File Prior to Visit  Medication Sig Dispense Refill  . amLODipine (NORVASC) 10 MG tablet Take 1 tablet (10 mg total) by mouth daily.  90 tablet  3  . atorvastatin (LIPITOR) 10 MG tablet Take 1 tablet (10 mg total) by mouth daily.  90 tablet  3  . Calcium Carbonate-Vitamin D (CALTRATE 600+D) 600-400 MG-UNIT per tablet Take 1 tablet by mouth daily.        Marland Kitchen dipyridamole-aspirin (AGGRENOX) 25-200 MG per 12 hr capsule Take 1 capsule by mouth 2 (two) times daily.  180 capsule  3  . donepezil (ARICEPT) 10 MG tablet Take 1 tablet (10 mg total) by mouth daily.  90 tablet  3  . estradiol (ESTRACE) 0.1 MG/GM vaginal cream Apply two times per week  42.5 g  5  . folic acid-pyridoxine-cyancobalamin (FOLTX) 2.5-25-2 MG TABS Take 1 tablet by mouth daily.  90 each  3  . hydrochlorothiazide (MICROZIDE) 12.5 MG capsule Take 1 capsule (12.5 mg total) by mouth daily.  90 capsule  3  . levothyroxine (SYNTHROID, LEVOTHROID) 125 MCG tablet Take 1 tablet (125 mcg total) by mouth daily.  90 tablet  3  . Polyethylene Glycol 3350 (MIRALAX PO) Take by mouth daily.       . valsartan (DIOVAN) 320 MG tablet Take 1 tablet (320 mg total) by mouth daily.  90 tablet  3   Review of Systems Review of Systems  Constitutional: Negative for diaphoresis and unexpected weight change.  HENT:  Negative for tinnitus.   Eyes: Negative for photophobia and visual disturbance.  Respiratory: Negative for choking and stridor.   Gastrointestinal: Negative for vomiting and blood in stool.  Genitourinary: Negative for hematuria and decreased urine volume.  Musculoskeletal: Negative for acute joint swelling or worsening pain Skin: Negative for color change and wound.  Neurological: Negative for tremors and numbness.     Objective:   Physical Exam BP 132/70  Pulse 80  Temp 97.1 F (36.2 C) (Oral)  Ht 5\' 5"  (1.651 m)  Wt 141 lb 4 oz (64.071 kg)  BMI 23.51 kg/m2  SpO2 96% Physical Exam  VS noted Constitutional: Pt appears well-developed and well-nourished.  HENT: Head: Normocephalic.  Right Ear: External ear normal.  Left Ear: External ear normal.  Eyes: Conjunctivae and EOM are normal. Pupils are equal, round, and reactive to light.  Neck: Normal range of motion. Neck supple.  Cardiovascular: Normal rate and regular rhythm.   Pulmonary/Chest: Effort normal and breath sounds normal.  Abd:  Soft, NT, non-distended, + BS Neurological: Pt is alert. Walks with walker and without to get up on exam table with small assist only, does not o/w appear unsteadly  Skin: Skin is warm. No erythema.  Psychiatric: Pt behavior is normal, mild nervous    Assessment & Plan:

## 2012-04-11 ENCOUNTER — Encounter: Payer: Self-pay | Admitting: Internal Medicine

## 2012-04-11 LAB — HEPATIC FUNCTION PANEL
ALT: 20 U/L (ref 0–35)
AST: 28 U/L (ref 0–37)
Albumin: 4 g/dL (ref 3.5–5.2)
Total Bilirubin: 0.4 mg/dL (ref 0.3–1.2)
Total Protein: 7.2 g/dL (ref 6.0–8.3)

## 2012-04-11 LAB — TSH: TSH: 7.83 u[IU]/mL — ABNORMAL HIGH (ref 0.35–5.50)

## 2012-04-11 LAB — LIPID PANEL
HDL: 64.1 mg/dL (ref >39.00)
Triglycerides: 161 mg/dL — ABNORMAL HIGH (ref 0.0–149.0)

## 2012-04-11 LAB — BASIC METABOLIC PANEL
BUN: 32 mg/dL — ABNORMAL HIGH (ref 6–23)
Chloride: 103 mEq/L (ref 96–112)
Creatinine, Ser: 1.2 mg/dL (ref 0.4–1.2)
Glucose, Bld: 105 mg/dL — ABNORMAL HIGH (ref 70–99)

## 2012-04-19 DIAGNOSIS — N816 Rectocele: Secondary | ICD-10-CM | POA: Diagnosis not present

## 2012-04-19 DIAGNOSIS — N8111 Cystocele, midline: Secondary | ICD-10-CM | POA: Diagnosis not present

## 2012-04-24 DIAGNOSIS — Z961 Presence of intraocular lens: Secondary | ICD-10-CM | POA: Diagnosis not present

## 2012-04-24 DIAGNOSIS — H31009 Unspecified chorioretinal scars, unspecified eye: Secondary | ICD-10-CM | POA: Diagnosis not present

## 2012-04-24 DIAGNOSIS — D3130 Benign neoplasm of unspecified choroid: Secondary | ICD-10-CM | POA: Diagnosis not present

## 2012-04-25 DIAGNOSIS — I6322 Cerebral infarction due to unspecified occlusion or stenosis of basilar arteries: Secondary | ICD-10-CM | POA: Diagnosis not present

## 2012-04-27 DIAGNOSIS — N8189 Other female genital prolapse: Secondary | ICD-10-CM | POA: Diagnosis not present

## 2012-04-27 DIAGNOSIS — N8111 Cystocele, midline: Secondary | ICD-10-CM | POA: Diagnosis not present

## 2012-05-11 DIAGNOSIS — N816 Rectocele: Secondary | ICD-10-CM | POA: Diagnosis not present

## 2012-05-11 DIAGNOSIS — N8111 Cystocele, midline: Secondary | ICD-10-CM | POA: Diagnosis not present

## 2012-06-11 ENCOUNTER — Ambulatory Visit (INDEPENDENT_AMBULATORY_CARE_PROVIDER_SITE_OTHER): Payer: Medicare Other | Admitting: *Deleted

## 2012-06-11 ENCOUNTER — Encounter: Payer: Self-pay | Admitting: Internal Medicine

## 2012-06-11 DIAGNOSIS — I442 Atrioventricular block, complete: Secondary | ICD-10-CM

## 2012-06-11 DIAGNOSIS — N816 Rectocele: Secondary | ICD-10-CM | POA: Diagnosis not present

## 2012-06-11 DIAGNOSIS — Z950 Presence of cardiac pacemaker: Secondary | ICD-10-CM | POA: Diagnosis not present

## 2012-06-11 DIAGNOSIS — N8111 Cystocele, midline: Secondary | ICD-10-CM | POA: Diagnosis not present

## 2012-06-12 DIAGNOSIS — Z23 Encounter for immunization: Secondary | ICD-10-CM | POA: Diagnosis not present

## 2012-06-13 LAB — REMOTE PACEMAKER DEVICE
ATRIAL PACING PM: 40
BATTERY VOLTAGE: 2.79 V
VENTRICULAR PACING PM: 100

## 2012-06-20 ENCOUNTER — Encounter: Payer: Self-pay | Admitting: *Deleted

## 2012-07-11 DIAGNOSIS — N8111 Cystocele, midline: Secondary | ICD-10-CM | POA: Diagnosis not present

## 2012-07-11 DIAGNOSIS — N816 Rectocele: Secondary | ICD-10-CM | POA: Diagnosis not present

## 2012-08-14 DIAGNOSIS — N8189 Other female genital prolapse: Secondary | ICD-10-CM | POA: Diagnosis not present

## 2012-08-14 DIAGNOSIS — N816 Rectocele: Secondary | ICD-10-CM | POA: Diagnosis not present

## 2012-08-14 DIAGNOSIS — N8111 Cystocele, midline: Secondary | ICD-10-CM | POA: Diagnosis not present

## 2012-08-30 ENCOUNTER — Encounter: Payer: Self-pay | Admitting: Internal Medicine

## 2012-08-30 ENCOUNTER — Ambulatory Visit (INDEPENDENT_AMBULATORY_CARE_PROVIDER_SITE_OTHER): Payer: Medicare Other | Admitting: Internal Medicine

## 2012-08-30 VITALS — BP 114/86 | HR 86 | Ht 64.0 in | Wt 142.0 lb

## 2012-08-30 DIAGNOSIS — I10 Essential (primary) hypertension: Secondary | ICD-10-CM | POA: Diagnosis not present

## 2012-08-30 DIAGNOSIS — Z950 Presence of cardiac pacemaker: Secondary | ICD-10-CM | POA: Diagnosis not present

## 2012-08-30 DIAGNOSIS — I442 Atrioventricular block, complete: Secondary | ICD-10-CM

## 2012-08-30 LAB — PACEMAKER DEVICE OBSERVATION
AL AMPLITUDE: 5.6 mv
AL IMPEDENCE PM: 628 Ohm
AL THRESHOLD: 0.5 v
ATRIAL PACING PM: 40
BAMS-0001: 175 {beats}/min
BATTERY VOLTAGE: 2.79 v
RV LEAD AMPLITUDE: 15.68 mv
RV LEAD IMPEDENCE PM: 557 Ohm
RV LEAD THRESHOLD: 0.75 v
VENTRICULAR PACING PM: 100

## 2012-08-30 NOTE — Progress Notes (Signed)
HPI [name] returns today for followup. She is a very pleasant 78 year old woman with a history of hypertension, symptomatic bradycardia, complete heart block, status post permanent pacemaker insertion. She has a history of stroke but no history of atrial fibrillation. She has done well in the interim. She denies chest pain or shortness of breath. No syncope. She has not fallen. Allergies  Allergen Reactions  . Codeine      Current Outpatient Prescriptions  Medication Sig Dispense Refill  . amLODipine (NORVASC) 10 MG tablet Take 1 tablet (10 mg total) by mouth daily.  90 tablet  3  . atorvastatin (LIPITOR) 10 MG tablet Take 1 tablet (10 mg total) by mouth daily.  90 tablet  3  . Calcium Carbonate-Vitamin D (CALTRATE 600+D) 600-400 MG-UNIT per tablet Take 1 tablet by mouth daily.        Marland Kitchen dipyridamole-aspirin (AGGRENOX) 25-200 MG per 12 hr capsule Take 1 capsule by mouth 2 (two) times daily.  180 capsule  3  . donepezil (ARICEPT) 10 MG tablet Take 1 tablet (10 mg total) by mouth daily.  90 tablet  3  . estradiol (ESTRACE) 0.1 MG/GM vaginal cream Apply two times per week  42.5 g  5  . folic acid-pyridoxine-cyancobalamin (FOLTX) 2.5-25-2 MG TABS Take 1 tablet by mouth daily.  90 each  3  . hydrochlorothiazide (MICROZIDE) 12.5 MG capsule Take 1 capsule (12.5 mg total) by mouth daily.  90 capsule  3  . levothyroxine (SYNTHROID, LEVOTHROID) 125 MCG tablet Take 1 tablet (125 mcg total) by mouth daily.  90 tablet  3  . valsartan (DIOVAN) 320 MG tablet Take 1 tablet (320 mg total) by mouth daily.  90 tablet  3     Past Medical History  Diagnosis Date  . Aphasia 01/09/2009  . 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 CEREBROVASCULAR ACCIDENT, HX OF 11/20/2007  . CVA 01/09/2009  . DEMENTIA 05/19/2008  . DIVERTICULOSIS, COLON 11/20/2007  . FATIGUE 11/20/2007  . GLUCOSE INTOLERANCE 12/06/2010  . HYPERLIPIDEMIA 11/20/2007  . HYPERTENSION 11/20/2007    Echo was done 07/07/11: mod LVH, EF 55-60%, grade 1 diast  dysfxn, mild MR, mild LAE, mod TR, PASP 39  . HYPOTHYROIDISM 11/20/2007  . IRON DEFICIENCY 12/06/2010  . PACEMAKER, PERMANENT 01/09/2009  . TIA 05/14/2003    elevated CE's in setting of TIA and falls in 10/12 - echo normal with no further cardiac w/u pursued  . Urinary incontinence 01/09/2009  . Impaired glucose tolerance 06/03/2011  . TIA (transient ischemic attack) 10/10/2011    ROS:   All systems reviewed and negative except as noted in the HPI.   Past Surgical History  Procedure Date  . Medtronic cynthia dual chamber pacemaker serial number was [id number]...04/15/2009   . Abdominal hysterectomy   . S/p cerebral aneurysm 1991  . S/p pacemaker ddd   . Cholecystectomy   . S/p retinal attachment   . S/p thyroidectomy      Family History  Problem Relation Age of Onset  . Cancer Mother     breast cancer     History   Social History  . Marital Status: Divorced    Spouse Name: N/A    Number of Children: N/A  . Years of Education: N/A   Occupational History  . retired Catering manager, lives at [employer]    Social History Main Topics  . Smoking status: Never Smoker   . Smokeless tobacco: Not on file  . Alcohol Use: No  . Drug Use: No  .  Sexually Active: Not on file   Other Topics Concern  . Not on file   Social History Narrative  . No narrative on file     BP 114/86  Pulse 86  Ht 5\' 4"  (1.626 m)  Wt 142 lb (64.411 kg)  BMI 24.37 kg/m2  Physical Exam:  Well appearing elderly woman, NAD HEENT: Unremarkable Neck:  No JVD, no thyromegally Lungs:  Clear with no wheezes, rales, or rhonchi. HEART:  Regular rate rhythm, no murmurs, no rubs, no clicks Abd:  soft, positive bowel sounds, no organomegally, no rebound, no guarding Ext:  2 plus pulses, no edema, no cyanosis, no clubbing Skin:  No rashes no nodules Neuro:  CN II through XII intact, motor grossly intact  EKG AV sequential pacing DEVICE  Normal device function.  See PaceArt for details.   Assess/Plan:

## 2012-08-30 NOTE — Assessment & Plan Note (Signed)
Her blood pressure today is well controlled. She will continue her current medical therapy, and maintain a low-sodium diet. 

## 2012-08-30 NOTE — Patient Instructions (Signed)
Your physician wants you to follow-up in: 12 months with Dr Taylor You will receive a reminder letter in the mail two months in advance. If you don't receive a letter, please call our office to schedule the follow-up appointment.   Remote monitoring is used to monitor your Pacemaker of ICD from home. This monitoring reduces the number of office visits required to check your device to one time per year. It allows us to keep an eye on the functioning of your device to ensure it is working properly. You are scheduled for a device check from home on 12/03/12. You may send your transmission at any time that day. If you have a wireless device, the transmission will be sent automatically. After your physician reviews your transmission, you will receive a postcard with your next transmission date.   

## 2012-08-30 NOTE — Assessment & Plan Note (Signed)
Her Medtronic dual-chamber pacemaker is working normally. We'll plan to recheck in several months. 

## 2012-09-25 ENCOUNTER — Inpatient Hospital Stay (HOSPITAL_COMMUNITY)
Admission: EM | Admit: 2012-09-25 | Discharge: 2012-09-29 | DRG: 194 | Disposition: A | Discharge status: Skilled Nursing Facility | Payer: Medicare Other | Attending: Internal Medicine | Admitting: Internal Medicine

## 2012-09-25 ENCOUNTER — Emergency Department (HOSPITAL_COMMUNITY): Payer: Medicare Other

## 2012-09-25 ENCOUNTER — Ambulatory Visit (INDEPENDENT_AMBULATORY_CARE_PROVIDER_SITE_OTHER): Payer: Medicare Other | Admitting: Family Medicine

## 2012-09-25 ENCOUNTER — Encounter (HOSPITAL_COMMUNITY): Payer: Self-pay | Admitting: *Deleted

## 2012-09-25 ENCOUNTER — Encounter: Payer: Self-pay | Admitting: Family Medicine

## 2012-09-25 VITALS — BP 134/70 | HR 89 | Temp 100.0°F | Wt 141.0 lb

## 2012-09-25 DIAGNOSIS — F0390 Unspecified dementia without behavioral disturbance: Secondary | ICD-10-CM | POA: Diagnosis present

## 2012-09-25 DIAGNOSIS — I69920 Aphasia following unspecified cerebrovascular disease: Secondary | ICD-10-CM | POA: Diagnosis not present

## 2012-09-25 DIAGNOSIS — R0602 Shortness of breath: Secondary | ICD-10-CM | POA: Diagnosis not present

## 2012-09-25 DIAGNOSIS — Z950 Presence of cardiac pacemaker: Secondary | ICD-10-CM

## 2012-09-25 DIAGNOSIS — J13 Pneumonia due to Streptococcus pneumoniae: Principal | ICD-10-CM | POA: Diagnosis present

## 2012-09-25 DIAGNOSIS — E785 Hyperlipidemia, unspecified: Secondary | ICD-10-CM | POA: Diagnosis present

## 2012-09-25 DIAGNOSIS — Z8679 Personal history of other diseases of the circulatory system: Secondary | ICD-10-CM

## 2012-09-25 DIAGNOSIS — J438 Other emphysema: Secondary | ICD-10-CM | POA: Diagnosis not present

## 2012-09-25 DIAGNOSIS — R7302 Impaired glucose tolerance (oral): Secondary | ICD-10-CM

## 2012-09-25 DIAGNOSIS — R4701 Aphasia: Secondary | ICD-10-CM

## 2012-09-25 DIAGNOSIS — I442 Atrioventricular block, complete: Secondary | ICD-10-CM | POA: Diagnosis not present

## 2012-09-25 DIAGNOSIS — R5381 Other malaise: Secondary | ICD-10-CM

## 2012-09-25 DIAGNOSIS — J90 Pleural effusion, not elsewhere classified: Secondary | ICD-10-CM | POA: Diagnosis not present

## 2012-09-25 DIAGNOSIS — G459 Transient cerebral ischemic attack, unspecified: Secondary | ICD-10-CM

## 2012-09-25 DIAGNOSIS — R32 Unspecified urinary incontinence: Secondary | ICD-10-CM | POA: Diagnosis present

## 2012-09-25 DIAGNOSIS — E871 Hypo-osmolality and hyponatremia: Secondary | ICD-10-CM | POA: Diagnosis not present

## 2012-09-25 DIAGNOSIS — N390 Urinary tract infection, site not specified: Secondary | ICD-10-CM

## 2012-09-25 DIAGNOSIS — R509 Fever, unspecified: Secondary | ICD-10-CM | POA: Diagnosis not present

## 2012-09-25 DIAGNOSIS — B954 Other streptococcus as the cause of diseases classified elsewhere: Secondary | ICD-10-CM | POA: Diagnosis present

## 2012-09-25 DIAGNOSIS — R0902 Hypoxemia: Secondary | ICD-10-CM

## 2012-09-25 DIAGNOSIS — F068 Other specified mental disorders due to known physiological condition: Secondary | ICD-10-CM

## 2012-09-25 DIAGNOSIS — E039 Hypothyroidism, unspecified: Secondary | ICD-10-CM | POA: Diagnosis present

## 2012-09-25 DIAGNOSIS — J984 Other disorders of lung: Secondary | ICD-10-CM | POA: Diagnosis not present

## 2012-09-25 DIAGNOSIS — D509 Iron deficiency anemia, unspecified: Secondary | ICD-10-CM

## 2012-09-25 DIAGNOSIS — I6350 Cerebral infarction due to unspecified occlusion or stenosis of unspecified cerebral artery: Secondary | ICD-10-CM

## 2012-09-25 DIAGNOSIS — K5730 Diverticulosis of large intestine without perforation or abscess without bleeding: Secondary | ICD-10-CM

## 2012-09-25 DIAGNOSIS — M6281 Muscle weakness (generalized): Secondary | ICD-10-CM | POA: Diagnosis not present

## 2012-09-25 DIAGNOSIS — I69998 Other sequelae following unspecified cerebrovascular disease: Secondary | ICD-10-CM | POA: Diagnosis not present

## 2012-09-25 DIAGNOSIS — D72829 Elevated white blood cell count, unspecified: Secondary | ICD-10-CM

## 2012-09-25 DIAGNOSIS — R269 Unspecified abnormalities of gait and mobility: Secondary | ICD-10-CM | POA: Diagnosis not present

## 2012-09-25 DIAGNOSIS — I10 Essential (primary) hypertension: Secondary | ICD-10-CM

## 2012-09-25 DIAGNOSIS — E876 Hypokalemia: Secondary | ICD-10-CM | POA: Diagnosis not present

## 2012-09-25 DIAGNOSIS — J189 Pneumonia, unspecified organism: Secondary | ICD-10-CM | POA: Diagnosis not present

## 2012-09-25 DIAGNOSIS — R279 Unspecified lack of coordination: Secondary | ICD-10-CM | POA: Diagnosis not present

## 2012-09-25 DIAGNOSIS — R5383 Other fatigue: Secondary | ICD-10-CM

## 2012-09-25 DIAGNOSIS — R748 Abnormal levels of other serum enzymes: Secondary | ICD-10-CM

## 2012-09-25 DIAGNOSIS — Z0000 Encounter for general adult medical examination without abnormal findings: Secondary | ICD-10-CM

## 2012-09-25 LAB — CBC WITH DIFFERENTIAL/PLATELET
Eosinophils Absolute: 0 10*3/uL (ref 0.0–0.7)
HCT: 37.2 % (ref 36.0–46.0)
Hemoglobin: 12.6 g/dL (ref 12.0–15.0)
Lymphs Abs: 1.1 10*3/uL (ref 0.7–4.0)
MCH: 30.4 pg (ref 26.0–34.0)
MCV: 89.9 fL (ref 78.0–100.0)
Monocytes Absolute: 0.9 10*3/uL (ref 0.1–1.0)
Monocytes Relative: 8 % (ref 3–12)
Neutrophils Relative %: 81 % — ABNORMAL HIGH (ref 43–77)
RBC: 4.14 MIL/uL (ref 3.87–5.11)

## 2012-09-25 LAB — URINALYSIS, ROUTINE W REFLEX MICROSCOPIC
Ketones, ur: 40 mg/dL — AB
Nitrite: NEGATIVE
Protein, ur: 30 mg/dL — AB

## 2012-09-25 LAB — COMPREHENSIVE METABOLIC PANEL
Alkaline Phosphatase: 75 U/L (ref 39–117)
BUN: 23 mg/dL (ref 6–23)
Chloride: 95 mEq/L — ABNORMAL LOW (ref 96–112)
GFR calc Af Amer: 59 mL/min — ABNORMAL LOW (ref >90)
Glucose, Bld: 125 mg/dL — ABNORMAL HIGH (ref 70–99)
Potassium: 3.1 mEq/L — ABNORMAL LOW (ref 3.5–5.1)
Total Bilirubin: 0.4 mg/dL (ref 0.3–1.2)
Total Protein: 7.4 g/dL (ref 6.0–8.3)

## 2012-09-25 LAB — URINE MICROSCOPIC-ADD ON

## 2012-09-25 LAB — CBC
MCH: 30.4 pg (ref 26.0–34.0)
MCHC: 33.8 g/dL (ref 30.0–36.0)
MCV: 90.1 fL (ref 78.0–100.0)
Platelets: 183 10*3/uL (ref 150–400)
RDW: 13.2 % (ref 11.5–15.5)

## 2012-09-25 LAB — CREATININE, SERUM: Creatinine, Ser: 0.85 mg/dL (ref 0.50–1.10)

## 2012-09-25 LAB — CG4 I-STAT (LACTIC ACID): Lactic Acid, Venous: 1.4 mmol/L (ref 0.5–2.2)

## 2012-09-25 MED ORDER — OSELTAMIVIR PHOSPHATE 75 MG PO CAPS
75.0000 mg | ORAL_CAPSULE | Freq: Two times a day (BID) | ORAL | Status: DC
  Administered 2012-09-26 – 2012-09-28 (×6): 75 mg via ORAL
  Filled 2012-09-25 (×7): qty 1

## 2012-09-25 MED ORDER — AMLODIPINE BESYLATE 10 MG PO TABS
10.0000 mg | ORAL_TABLET | Freq: Every day | ORAL | Status: DC
  Administered 2012-09-26 – 2012-09-29 (×5): 10 mg via ORAL
  Filled 2012-09-25 (×5): qty 1

## 2012-09-25 MED ORDER — DEXTROSE 5 % IV SOLN
500.0000 mg | Freq: Once | INTRAVENOUS | Status: DC
  Administered 2012-09-25: 500 mg via INTRAVENOUS
  Filled 2012-09-25: qty 500

## 2012-09-25 MED ORDER — FA-PYRIDOXINE-CYANOCOBALAMIN 2.5-25-2 MG PO TABS
1.0000 | ORAL_TABLET | Freq: Every day | ORAL | Status: DC
  Administered 2012-09-26 – 2012-09-29 (×4): 1 via ORAL
  Filled 2012-09-25 (×4): qty 1

## 2012-09-25 MED ORDER — IRBESARTAN 300 MG PO TABS
300.0000 mg | ORAL_TABLET | Freq: Every day | ORAL | Status: DC
  Administered 2012-09-26 – 2012-09-29 (×4): 300 mg via ORAL
  Filled 2012-09-25 (×4): qty 1

## 2012-09-25 MED ORDER — ASPIRIN-DIPYRIDAMOLE ER 25-200 MG PO CP12
1.0000 | ORAL_CAPSULE | Freq: Two times a day (BID) | ORAL | Status: DC
  Administered 2012-09-26 – 2012-09-29 (×8): 1 via ORAL
  Filled 2012-09-25 (×9): qty 1

## 2012-09-25 MED ORDER — DEXTROSE 5 % IV SOLN
250.0000 mg | INTRAVENOUS | Status: DC
  Administered 2012-09-26 – 2012-09-27 (×2): 250 mg via INTRAVENOUS
  Filled 2012-09-25 (×3): qty 250

## 2012-09-25 MED ORDER — SODIUM CHLORIDE 0.9 % IV BOLUS (SEPSIS)
500.0000 mL | Freq: Once | INTRAVENOUS | Status: AC
  Administered 2012-09-25: 500 mL via INTRAVENOUS

## 2012-09-25 MED ORDER — ONDANSETRON HCL 4 MG PO TABS
4.0000 mg | ORAL_TABLET | Freq: Four times a day (QID) | ORAL | Status: DC | PRN
  Filled 2012-09-25: qty 0.5

## 2012-09-25 MED ORDER — ACETAMINOPHEN 325 MG PO TABS: 650.0000 mg | ORAL_TABLET | Freq: Four times a day (QID) | ORAL | Status: DC | PRN

## 2012-09-25 MED ORDER — SODIUM CHLORIDE 0.9 % IJ SOLN
3.0000 mL | Freq: Two times a day (BID) | INTRAMUSCULAR | Status: DC
  Administered 2012-09-26 – 2012-09-28 (×5): 3 mL via INTRAVENOUS

## 2012-09-25 MED ORDER — ALBUTEROL SULFATE (5 MG/ML) 0.5% IN NEBU
5.0000 mg | INHALATION_SOLUTION | Freq: Once | RESPIRATORY_TRACT | Status: AC
  Administered 2012-09-25: 5 mg via RESPIRATORY_TRACT
  Filled 2012-09-25: qty 1

## 2012-09-25 MED ORDER — RIVAROXABAN 15 MG PO TABS: 15.0000 mg | ORAL_TABLET | Freq: Two times a day (BID) | ORAL | Status: DC

## 2012-09-25 MED ORDER — ALBUTEROL SULFATE (5 MG/ML) 0.5% IN NEBU: 2.5000 mg | INHALATION_SOLUTION | RESPIRATORY_TRACT | Status: DC | PRN

## 2012-09-25 MED ORDER — HEPARIN SODIUM (PORCINE) 5000 UNIT/ML IJ SOLN
5000.0000 [IU] | Freq: Three times a day (TID) | INTRAMUSCULAR | Status: DC
  Administered 2012-09-26 – 2012-09-29 (×10): 5000 [IU] via SUBCUTANEOUS
  Filled 2012-09-25 (×13): qty 1

## 2012-09-25 MED ORDER — GUAIFENESIN-DM 100-10 MG/5ML PO SYRP
5.0000 mL | ORAL_SOLUTION | ORAL | Status: DC | PRN
  Administered 2012-09-27: 5 mL via ORAL
  Filled 2012-09-25: qty 5

## 2012-09-25 MED ORDER — IPRATROPIUM BROMIDE 0.02 % IN SOLN
0.5000 mg | Freq: Four times a day (QID) | RESPIRATORY_TRACT | Status: DC
  Administered 2012-09-26 – 2012-09-29 (×14): 0.5 mg via RESPIRATORY_TRACT
  Filled 2012-09-25 (×15): qty 2.5

## 2012-09-25 MED ORDER — LEVOTHYROXINE SODIUM 125 MCG PO TABS
125.0000 ug | ORAL_TABLET | Freq: Every day | ORAL | Status: DC
  Administered 2012-09-26 – 2012-09-29 (×4): 125 ug via ORAL
  Filled 2012-09-25 (×5): qty 1

## 2012-09-25 MED ORDER — IPRATROPIUM BROMIDE 0.02 % IN SOLN
0.5000 mg | Freq: Once | RESPIRATORY_TRACT | Status: AC
  Administered 2012-09-25: 0.5 mg via RESPIRATORY_TRACT
  Filled 2012-09-25: qty 2.5

## 2012-09-25 MED ORDER — SODIUM CHLORIDE 0.9 % IV SOLN
INTRAVENOUS | Status: DC
  Administered 2012-09-25: 21:00:00 via INTRAVENOUS

## 2012-09-25 MED ORDER — ACETAMINOPHEN 650 MG RE SUPP: 650.0000 mg | Freq: Four times a day (QID) | RECTAL | Status: DC | PRN

## 2012-09-25 MED ORDER — POTASSIUM CHLORIDE IN NACL 20-0.9 MEQ/L-% IV SOLN
INTRAVENOUS | Status: DC
  Administered 2012-09-26 – 2012-09-27 (×4): via INTRAVENOUS
  Filled 2012-09-25 (×7): qty 1000

## 2012-09-25 MED ORDER — METHYLPREDNISOLONE SODIUM SUCC 125 MG IJ SOLR
125.0000 mg | Freq: Four times a day (QID) | INTRAMUSCULAR | Status: DC
  Administered 2012-09-26 – 2012-09-27 (×6): 125 mg via INTRAVENOUS
  Filled 2012-09-25 (×10): qty 2

## 2012-09-25 MED ORDER — ONDANSETRON HCL 4 MG/2ML IJ SOLN: 4.0000 mg | Freq: Four times a day (QID) | INTRAMUSCULAR | Status: DC | PRN

## 2012-09-25 MED ORDER — DEXTROSE 5 % IV SOLN
1.0000 g | Freq: Once | INTRAVENOUS | Status: AC
  Administered 2012-09-25: 1 g via INTRAVENOUS
  Filled 2012-09-25: qty 10

## 2012-09-25 MED ORDER — METHYLPREDNISOLONE SODIUM SUCC 125 MG IJ SOLR
125.0000 mg | Freq: Once | INTRAMUSCULAR | Status: AC
  Administered 2012-09-25: 125 mg via INTRAVENOUS
  Filled 2012-09-25: qty 2

## 2012-09-25 MED ORDER — SODIUM CHLORIDE 0.9 % IV BOLUS (SEPSIS)
2000.0000 mL | Freq: Once | INTRAVENOUS | Status: AC
  Administered 2012-09-25: 2000 mL via INTRAVENOUS

## 2012-09-25 MED ORDER — ALBUTEROL SULFATE (5 MG/ML) 0.5% IN NEBU
2.5000 mg | INHALATION_SOLUTION | RESPIRATORY_TRACT | Status: DC
  Administered 2012-09-26 – 2012-09-27 (×12): 2.5 mg via RESPIRATORY_TRACT
  Filled 2012-09-25 (×12): qty 0.5

## 2012-09-25 MED ORDER — DONEPEZIL HCL 10 MG PO TABS
10.0000 mg | ORAL_TABLET | Freq: Every day | ORAL | Status: DC
  Administered 2012-09-26 – 2012-09-28 (×4): 10 mg via ORAL
  Filled 2012-09-25 (×5): qty 1

## 2012-09-25 MED ORDER — CALCIUM CARBONATE-VITAMIN D 500-200 MG-UNIT PO TABS
2.0000 | ORAL_TABLET | Freq: Every day | ORAL | Status: DC
  Administered 2012-09-26 – 2012-09-29 (×5): 2 via ORAL
  Filled 2012-09-25 (×5): qty 2

## 2012-09-25 MED ORDER — RIVAROXABAN 20 MG PO TABS: 20.0000 mg | ORAL_TABLET | Freq: Every day | ORAL | Status: DC

## 2012-09-25 NOTE — ED Notes (Signed)
Patient transported to X-ray 

## 2012-09-25 NOTE — Progress Notes (Signed)
Chief Complaint  Patient presents with  . Cough    fever, fatigue, headache, nasal congestion, wheezing since Sunday     HPI:  -started: 2 days -symptoms:nasal congestion, sore throat, cough, SOB, nausea, chilled, not eating and drinking much -denies:fever, SOB, NVD, tooth pain, strep or mono exposure -has tried: tylenol -sick contacts: son and his family with flu - but they have stayed away from patient -Hx of: no hx of asthma or chronic lung dz, hx of complete heart block per son -lives in independent living facility  ROS: See pertinent positives and negatives per HPI.  Past Medical History  Diagnosis Date  . Aphasia 01/09/2009  . 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 CEREBROVASCULAR ACCIDENT, HX OF 11/20/2007  . CVA 01/09/2009  . DEMENTIA 05/19/2008  . DIVERTICULOSIS, COLON 11/20/2007  . FATIGUE 11/20/2007  . GLUCOSE INTOLERANCE 12/06/2010  . HYPERLIPIDEMIA 11/20/2007  . HYPERTENSION 11/20/2007    Echo was done 07/07/11: mod LVH, EF 55-60%, grade 1 diast dysfxn, mild MR, mild LAE, mod TR, PASP 39  . HYPOTHYROIDISM 11/20/2007  . IRON DEFICIENCY 12/06/2010  . PACEMAKER, PERMANENT 01/09/2009  . TIA 05/14/2003    elevated CE's in setting of TIA and falls in 10/12 - echo normal with no further cardiac w/u pursued  . Urinary incontinence 01/09/2009  . Impaired glucose tolerance 06/03/2011  . TIA (transient ischemic attack) 10/10/2011    Family History  Problem Relation Age of Onset  . Cancer Mother     breast cancer    History   Social History  . Marital Status: Divorced    Spouse Name: N/A    Number of Children: N/A  . Years of Education: N/A   Occupational History  . retired Catering manager, lives at [employer]    Social History Main Topics  . Smoking status: Never Smoker   . Smokeless tobacco: None  . Alcohol Use: No  . Drug Use: No  . Sexually Active: None   Other Topics Concern  . None   Social History Narrative  . None    Current outpatient prescriptions:amLODipine  (NORVASC) 10 MG tablet, Take 1 tablet (10 mg total) by mouth daily., Disp: 90 tablet, Rfl: 3;  atorvastatin (LIPITOR) 10 MG tablet, Take 1 tablet (10 mg total) by mouth daily., Disp: 90 tablet, Rfl: 3;  Calcium Carbonate-Vitamin D (CALTRATE 600+D) 600-400 MG-UNIT per tablet, Take 1 tablet by mouth daily.  , Disp: , Rfl:  dipyridamole-aspirin (AGGRENOX) 25-200 MG per 12 hr capsule, Take 1 capsule by mouth 2 (two) times daily., Disp: 180 capsule, Rfl: 3;  donepezil (ARICEPT) 10 MG tablet, Take 1 tablet (10 mg total) by mouth daily., Disp: 90 tablet, Rfl: 3;  estradiol (ESTRACE) 0.1 MG/GM vaginal cream, Apply two times per week, Disp: 42.5 g, Rfl: 5;  folic acid-pyridoxine-cyancobalamin (FOLTX) 2.5-25-2 MG TABS, Take 1 tablet by mouth daily., Disp: 90 each, Rfl: 3 hydrochlorothiazide (MICROZIDE) 12.5 MG capsule, Take 1 capsule (12.5 mg total) by mouth daily., Disp: 90 capsule, Rfl: 3;  levothyroxine (SYNTHROID, LEVOTHROID) 125 MCG tablet, Take 1 tablet (125 mcg total) by mouth daily., Disp: 90 tablet, Rfl: 3;  valsartan (DIOVAN) 320 MG tablet, Take 1 tablet (320 mg total) by mouth daily., Disp: 90 tablet, Rfl: 3  EXAM:  Filed Vitals:   09/25/12 1625  BP: 134/70  Pulse: 89  Temp: 100 F (37.8 C)  O2 87-91 on RA, up to 92-94 on 2L O2  There is no height on file to calculate BMI.  GENERAL: vitals reviewed  and listed above, alert, oriented, appears well hydrated and in no acute distress  HEENT: atraumatic, conjunttiva clear, no obvious abnormalities on inspection of external nose and ears, normal appearance of ear canals and TMs, clear nasal congestion, mild post oropharyngeal erythema with PND, no tonsillar edema or exudate, no sinus TTP  NECK: no obvious masses on inspection  LUNGS: rhonchi L base  CV: HRRR, no peripheral edema  MS: moves all extremities without noticeable abnormality  PSYCH: pleasant and cooperative, no obvious depression or anxiety  ASSESSMENT AND PLAN:  Discussed the  following assessment and plan:  1. Hypoxia   2. Fever chills   3. SOB (shortness of breath)    -pt with SOB and hypoxia, 87-91 O2 sats on RA - up to 93-94 on 2L O2, with abnormal lung exam, advised should be evaluated further and monitored in ED given at risk for severe disease with age and frailty - pt and son agreeable to this plan -suspect pna or influenza -refused EMS transport -ED notified  Patient Instructions  INSTRUCTIONS FOR UPPER RESPIRATORY INFECTION:  -plenty of rest and fluids  -nasal saline wash 2-3 times daily (use prepackaged nasal saline or bottled/distilled water if making your own)   -clean nose with nasal saline before using the nasal steroid or sinex  -can use sinex nasal spray for drainage and nasal congestion - but do NOT use longer then 3-4 days  -can use tylenol or ibuprofen as directed for aches and sorethroat  -in the winter time, using a humidifier at night is helpful (please follow cleaning instructions)  -if you are taking a cough medication - use only as directed, may also try a teaspoon of honey to coat the throat and throat lozenges  -for sore throat, salt water gargles can help  -follow up if you have fevers, facial pain, tooth pain, difficulty breathing or are worsening or not getting better in 5-7 days      Ladell Bey R.

## 2012-09-25 NOTE — H&P (Addendum)
Hospitalist Admission History and Physical  Patient name: [name] Medical record number: [id number] Date of birth: [birth year] Age: 78 y.o. Gender: female  Primary Care Provider: Oliver Barre, MD   History of Present Illness: [name] is a 78 y.o. year old female presenting with progressive shortness of breath, cough, and fever x 2 days. Tmax at home was 100.3. Pt was seen by PCP for sxs earlier today. Pt was sent from PCPs to ED where pt was noted to be hypoxic in low 90s in addition to wheezing on lung exam. In ED pt received albuterol/atrovent nebs as well as IV solumedrol and supplemental O2with subsequent improvement in sxs. Pt also obtained a CXR which showed emphysematous changes as well as questionable infiltrate in LLL. Pt denies any prior smoking history or hx/o asthma/albuterol use. Pt has had her flu shot this year. No other recent infections per pt. Last hospitalization was 06/2011. Pt does report multiple sick contacts with similar sxs. Significan PMHx includes HTN, hx/o CVA with secondary mild aphasia as well as complete AV block s/p pacemaker. Per son, pt does live in supervised housing, however this is not an ALF or SNF.    Patient Active Problem List  Diagnosis  . HYPOTHYROIDISM  . HYPERLIPIDEMIA  . DEMENTIA  . HYPERTENSION  . AV BLOCK, COMPLETE  . CVA  . TIA  . DIVERTICULOSIS, COLON  . FATIGUE  . Aphasia  . URINARY INCONTINENCE  . CEREBROVASCULAR ACCIDENT, HX OF  . PACEMAKER, PERMANENT  . IRON DEFICIENCY  . Impaired glucose tolerance  . Preventative health care  . Cardiac enzymes elevated   Past Medical History: Past Medical History  Diagnosis Date  . Aphasia 01/09/2009  . 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 CEREBROVASCULAR ACCIDENT, HX OF 11/20/2007  . CVA 01/09/2009  . DEMENTIA 05/19/2008  . DIVERTICULOSIS, COLON 11/20/2007  . FATIGUE 11/20/2007  . GLUCOSE INTOLERANCE 12/06/2010  . HYPERLIPIDEMIA 11/20/2007  . HYPERTENSION 11/20/2007    Echo was  done 07/07/11: mod LVH, EF 55-60%, grade 1 diast dysfxn, mild MR, mild LAE, mod TR, PASP 39  . HYPOTHYROIDISM 11/20/2007  . IRON DEFICIENCY 12/06/2010  . PACEMAKER, PERMANENT 01/09/2009  . TIA 05/14/2003    elevated CE's in setting of TIA and falls in 10/12 - echo normal with no further cardiac w/u pursued  . Urinary incontinence 01/09/2009  . Impaired glucose tolerance 06/03/2011  . TIA (transient ischemic attack) 10/10/2011    Past Surgical History: Past Surgical History  Procedure Date  . Medtronic cynthia dual chamber pacemaker serial number was [id number]...04/15/2009   . Abdominal hysterectomy   . S/p cerebral aneurysm 1991  . S/p pacemaker ddd   . Cholecystectomy   . S/p retinal attachment   . S/p thyroidectomy     Social History: History   Social History  . Marital Status: Divorced    Spouse Name: N/A    Number of Children: N/A  . Years of Education: N/A   Occupational History  . retired Catering manager, lives at [employer]    Social History Main Topics  . Smoking status: Never Smoker   . Smokeless tobacco: None  . Alcohol Use: No  . Drug Use: No  . Sexually Active: None   Other Topics Concern  . None   Social History Narrative  . None    Family History: Family History  Problem Relation Age of Onset  . Cancer Mother     breast cancer    Allergies: Allergies  Allergen Reactions  .  Codeine     unknown    Current Facility-Administered Medications  Medication Dose Route Frequency Provider Last Rate Last Dose  . 0.9 %  sodium chloride infusion   Intravenous Continuous Hurman Horn, MD 125 mL/hr at 09/25/12 2046    . azithromycin (ZITHROMAX) 500 mg in dextrose 5 % 250 mL IVPB  500 mg Intravenous Once Hurman Horn, MD      . cefTRIAXone (ROCEPHIN) 1 g in dextrose 5 % 50 mL IVPB  1 g Intravenous Once Hurman Horn, MD 100 mL/hr at 09/25/12 2050 1 g at 09/25/12 2050  . sodium chloride 0.9 % bolus 500 mL  500 mL Intravenous Once Hurman Horn, MD       Current  Outpatient Prescriptions  Medication Sig Dispense Refill  . amLODipine (NORVASC) 10 MG tablet Take 1 tablet (10 mg total) by mouth daily.  90 tablet  3  . atorvastatin (LIPITOR) 10 MG tablet Take 1 tablet (10 mg total) by mouth daily.  90 tablet  3  . Calcium Carbonate-Vitamin D (CALTRATE 600+D) 600-400 MG-UNIT per tablet Take 1 tablet by mouth daily.        Marland Kitchen dipyridamole-aspirin (AGGRENOX) 25-200 MG per 12 hr capsule Take 1 capsule by mouth 2 (two) times daily.  180 capsule  3  . donepezil (ARICEPT) 10 MG tablet Take 1 tablet (10 mg total) by mouth daily.  90 tablet  3  . estradiol (ESTRACE) 0.1 MG/GM vaginal cream Apply two times per week  42.5 g  5  . folic acid-pyridoxine-cyancobalamin (FOLTX) 2.5-25-2 MG TABS Take 1 tablet by mouth daily.  90 each  3  . hydrochlorothiazide (MICROZIDE) 12.5 MG capsule Take 1 capsule (12.5 mg total) by mouth daily.  90 capsule  3  . levothyroxine (SYNTHROID, LEVOTHROID) 125 MCG tablet Take 1 tablet (125 mcg total) by mouth daily.  90 tablet  3  . valsartan (DIOVAN) 320 MG tablet Take 1 tablet (320 mg total) by mouth daily.  90 tablet  3   Review Of Systems: Complete 12 point ROS negative except as noted above in HPI  Physical Exam:  Filed Vitals:   09/25/12 2028  BP: 146/83  Pulse: 79  Temp: 99.7 F (37.6 C)  Resp: 20    General: alert and in mild distress 2/2 coughing  HEENT: PERRLA, extra ocular movement intact, trachea midline and + rhinorrhea bilaterally, + post oropharyngeal erythema  Heart: S1, S2 normal, no murmur, rub or gallop, regular rate and rhythm Lungs: diffuse inspiratory wheezes and rales, + cough with deep breathing  Abdomen: abdomen is soft without significant tenderness, masses, organomegaly or guarding Extremities: extremities normal, atraumatic, no cyanosis or edema Skin:no rashes Neurology: noted mild aphasia,otherwise neuro exam grossly intact without significant focal abnormalities.  Labs and Imaging: Lab Results    Component Value Date/Time   NA 132* 09/25/2012  6:35 PM   K 3.1* 09/25/2012  6:35 PM   CL 95* 09/25/2012  6:35 PM   CO2 22 09/25/2012  6:35 PM   BUN 23 09/25/2012  6:35 PM   CREATININE 0.96 09/25/2012  6:35 PM   GLUCOSE 125* 09/25/2012  6:35 PM   Lab Results  Component Value Date   WBC 10.6* 09/25/2012   HGB 12.6 09/25/2012   HCT 37.2 09/25/2012   MCV 89.9 09/25/2012   PLT 225 09/25/2012   Dg Chest 2 View  09/25/2012  *RADIOLOGY REPORT*  Clinical Data: Productive cough and shortness of breath  CHEST - 2  VIEW  Comparison: 07/06/2011  Findings: Hyperexpansion is consistent with emphysema.  The right lung is clear.  There is some atelectasis or infiltrate at the left base with a small left pleural effusion. Cardiopericardial silhouette is at upper limits of normal for size.  Left dual lead permanent pacemaker remains in place.  Bones are diffusely demineralized.  IMPRESSION: Emphysema with atelectasis or infiltrate at the left base and a tiny left pleural effusion.   Original Report Authenticated By: Kennith Center, M.D.       Assessment and Plan: LAILYNN SOUTHGATE is a 78 y.o. year old female presenting with hypoxia and cough.   Resp:  I suspect that presenting resp status is likely multifactorial with some concern for flu vs. Flu like illness as initial nidus. Will start pt on tamiflu as pt is within 48 hour window for treatment. There is no documented hx/o obstructive lung disease, though there are some emphysematous lung changes on CXR. Will continue IV solumedrol (125mg  IV q 6 for 24 hours) as well as schedules albuterol/atrovent nebs pending reassesment.  PORT score of 108. Will continue rocephin and azithromycin for additional 24 hours, considering bridging to resp flouroquinolone. Given that it is unclear if pt lives in a true nursing home or not, rocephin and azithro should give good coverage for non ICU hospitalized CAP and HCAP. Contiue supplemental O2. I/S to improve lung  aeration.   CV: BPs stable. Continue home HTN meds. Will place on telemetry given hx/o complete AV block s/p pacemaker. No cardiac complaints currently. Noted diastolic dysfunction on last ECHO. However, pt is euvolemic currently.  ID: Noted slight leukocytosis @ 10.8 on CBC with neutrophilic predominance. Suspect multiple etiologies for presentation with flu vs. Flu like illness and secondary bacterial PNA being higher up on differential. Influenza panel and blood cultures obtained. Will also check urine strep and legionella. Urinary sources of presentation is also on the differential, though I highly suspect that urinary findings are more so incidental than a true urinary tract infection that would warrant treatment. Rocephin would cover this in the interim.   Renal: Noted hyponatremia and hypokalemia. Clinically euvolemic. Suspect that this may be from thiazide diuretic. Will hold this. NS+64mEq K. Recheck Na and K in the am. No greater than increase in Na over next 24 hours.   Neuro: baseline aphasia. Continue aggrenox .  GI: [facility] Prophylaxis: subq heparin Dispo: Pending further evaluation.

## 2012-09-25 NOTE — Patient Instructions (Signed)
INSTRUCTIONS FOR UPPER RESPIRATORY INFECTION:  -plenty of rest and fluids  -nasal saline wash 2-3 times daily (use prepackaged nasal saline or bottled/distilled water if making your own)   -clean nose with nasal saline before using the nasal steroid or sinex  -can use sinex nasal spray for drainage and nasal congestion - but do NOT use longer then 3-4 days  -can use tylenol or ibuprofen as directed for aches and sorethroat  -in the winter time, using a humidifier at night is helpful (please follow cleaning instructions)  -if you are taking a cough medication - use only as directed, may also try a teaspoon of honey to coat the throat and throat lozenges  -for sore throat, salt water gargles can help  -follow up if you have fevers, facial pain, tooth pain, difficulty breathing or are worsening or not getting better in 5-7 days  

## 2012-09-25 NOTE — ED Notes (Signed)
Pt sent here from pcp office for possible flu like symptoms since Sunday, having cough and fever. spo2 91% on room air.

## 2012-09-25 NOTE — ED Provider Notes (Signed)
History     CSN: [id number]  Arrival date & time 09/25/12  1723   First MD Initiated Contact with Patient 09/25/12 1736      Chief Complaint  Patient presents with  . Shortness of Breath    (Consider location/radiation/quality/duration/timing/severity/associated sxs/prior treatment) HPI This 78 year old female lives in an independent living facility, she has a three-day history of fever chills body aches anorexia fatigue generalized weakness cough nasal congestion shortness of breath wheezing no vomiting or diarrhea rash or confusion. She is gradually worsening over last 3 days. She had mild hypoxia at her primary care doctor's office just prior to arrival today with resting room air pulse oximetry of 87-92%. There is no treatment prior to arrival. Past Medical History  Diagnosis Date  . Aphasia 01/09/2009  . 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 CEREBROVASCULAR ACCIDENT, HX OF 11/20/2007  . CVA 01/09/2009  . DEMENTIA 05/19/2008  . DIVERTICULOSIS, COLON 11/20/2007  . FATIGUE 11/20/2007  . GLUCOSE INTOLERANCE 12/06/2010  . HYPERLIPIDEMIA 11/20/2007  . HYPERTENSION 11/20/2007    Echo was done 07/07/11: mod LVH, EF 55-60%, grade 1 diast dysfxn, mild MR, mild LAE, mod TR, PASP 39  . HYPOTHYROIDISM 11/20/2007  . IRON DEFICIENCY 12/06/2010  . PACEMAKER, PERMANENT 01/09/2009  . TIA 05/14/2003    elevated CE's in setting of TIA and falls in 10/12 - echo normal with no further cardiac w/u pursued  . Urinary incontinence 01/09/2009  . Impaired glucose tolerance 06/03/2011  . TIA (transient ischemic attack) 10/10/2011    Past Surgical History  Procedure Date  . Medtronic cynthia dual chamber pacemaker serial number was [id number]...04/15/2009   . Abdominal hysterectomy   . S/p cerebral aneurysm 1991  . S/p pacemaker ddd   . Cholecystectomy   . S/p retinal attachment   . S/p thyroidectomy     Family History  Problem Relation Age of Onset  . Cancer Mother     breast cancer    History  Substance  Use Topics  . Smoking status: Never Smoker   . Smokeless tobacco: Not on file  . Alcohol Use: No    OB History    Grav Para Term Preterm Abortions TAB SAB Ect Mult Living                  Review of Systems 10 Systems reviewed and are negative for acute change except as noted in the HPI. Allergies  Codeine  Home Medications   No current outpatient prescriptions on file.  BP 123/51  Pulse 76  Temp 97.8 F (36.6 C) (Oral)  Resp 20  Ht 5\' 4"  (1.626 m)  Wt 139 lb 12.4 oz (63.4 kg)  BMI 23.99 kg/m2  SpO2 90%  Physical Exam  Nursing note and vitals reviewed. Constitutional:       Awake, alert, nontoxic appearance.  HENT:  Head: Atraumatic.  Eyes: Right eye exhibits no discharge. Left eye exhibits no discharge.  Neck: Neck supple.  Cardiovascular: Normal rate and regular rhythm.   No murmur heard. Pulmonary/Chest: She is in respiratory distress. She has wheezes. She has rales. She exhibits no tenderness.       Mild respiratory distress at rest with diffuse scattered wheezes, rhonchi, and crackles, no sensory muscle usage but has minimal retractions, speaks short sentences with mild hypoxia on room air 87-92% just prior to arrival.  Abdominal: Soft. Bowel sounds are normal. She exhibits no distension and no mass. There is no tenderness. There is no rebound and no guarding.  Musculoskeletal:  She exhibits no edema and no tenderness.       Baseline ROM, no obvious new focal weakness.  Neurological: She is alert.       Mental status and motor strength appears baseline for patient and situation.  Skin: No rash noted.  Psychiatric: She has a normal mood and affect.    ED Course  Procedures (including critical care time)  Labs Reviewed  CBC WITH DIFFERENTIAL - Abnormal; Notable for the following:    WBC 10.6 (*)     Neutrophils Relative 81 (*)     Neutro Abs 8.6 (*)     Lymphocytes Relative 10 (*)     All other components within normal limits  COMPREHENSIVE METABOLIC  PANEL - Abnormal; Notable for the following:    Sodium 132 (*)     Potassium 3.1 (*)     Chloride 95 (*)     Glucose, Bld 125 (*)     GFR calc non Af Amer 51 (*)     GFR calc Af Amer 59 (*)     All other components within normal limits  URINALYSIS, ROUTINE W REFLEX MICROSCOPIC - Abnormal; Notable for the following:    APPearance CLOUDY (*)     Ketones, ur 40 (*)     Protein, ur 30 (*)     Leukocytes, UA LARGE (*)     All other components within normal limits  URINE MICROSCOPIC-ADD ON - Abnormal; Notable for the following:    Bacteria, UA MANY (*)     All other components within normal limits  CBC - Abnormal; Notable for the following:    RBC 3.42 (*)     Hemoglobin 10.4 (*)  DELTA CHECK NOTED   HCT 30.8 (*)     All other components within normal limits  CREATININE, SERUM - Abnormal; Notable for the following:    GFR calc non Af Amer 59 (*)     GFR calc Af Amer 69 (*)     All other components within normal limits  COMPREHENSIVE METABOLIC PANEL - Abnormal; Notable for the following:    Potassium 3.0 (*)     Glucose, Bld 189 (*)     Calcium 8.1 (*)     Albumin 2.6 (*)     GFR calc non Af Amer 74 (*)     GFR calc Af Amer 86 (*)     All other components within normal limits  CBC WITH DIFFERENTIAL - Abnormal; Notable for the following:    WBC 11.4 (*)     RBC 3.60 (*)     Hemoglobin 11.2 (*)     HCT 32.2 (*)     Neutrophils Relative 90 (*)     Neutro Abs 10.3 (*)     Lymphocytes Relative 6 (*)     All other components within normal limits  STREP PNEUMONIAE URINARY ANTIGEN - Abnormal; Notable for the following:    Strep Pneumo Urinary Antigen POSITIVE (*)     All other components within normal limits  HEMOGLOBIN A1C - Abnormal; Notable for the following:    Hemoglobin A1C 6.2 (*)     Mean Plasma Glucose 131 (*)     All other components within normal limits  BASIC METABOLIC PANEL - Abnormal; Notable for the following:    Potassium 3.4 (*)     CO2 18 (*)     Glucose, Bld  202 (*)     Calcium 7.8 (*)     GFR calc non Af  Amer 72 (*)     GFR calc Af Amer 84 (*)     All other components within normal limits  CBC - Abnormal; Notable for the following:    WBC 13.2 (*)     RBC 3.38 (*)     Hemoglobin 10.7 (*)     HCT 29.9 (*)     All other components within normal limits  INFLUENZA PANEL BY PCR  CULTURE, BLOOD (ROUTINE X 2)  CULTURE, BLOOD (ROUTINE X 2)  URINE CULTURE  CG4 I-STAT (LACTIC ACID)  LEGIONELLA ANTIGEN, URINE  BASIC METABOLIC PANEL  CBC   No results found.   1. CAP (community acquired pneumonia)   2. Hypoxia   3. Atrioventricular block, complete   4. UTI (lower urinary tract infection)   5. Cardiac pacemaker in situ   6. Hypokalemia       MDM  Patient / Family / Caregiver informed of clinical course, understand medical decision-making process, and agree with plan.The patient appears reasonably stabilized for admission considering the current resources, flow, and capabilities available in the ED at this time, and I doubt any other [facility] requiring further screening and/or treatment in the ED prior to admission.        Hurman Horn, MD 09/27/12 2121

## 2012-09-26 DIAGNOSIS — E876 Hypokalemia: Secondary | ICD-10-CM

## 2012-09-26 DIAGNOSIS — D72829 Elevated white blood cell count, unspecified: Secondary | ICD-10-CM

## 2012-09-26 DIAGNOSIS — I442 Atrioventricular block, complete: Secondary | ICD-10-CM

## 2012-09-26 DIAGNOSIS — N390 Urinary tract infection, site not specified: Secondary | ICD-10-CM

## 2012-09-26 DIAGNOSIS — J189 Pneumonia, unspecified organism: Secondary | ICD-10-CM

## 2012-09-26 LAB — COMPREHENSIVE METABOLIC PANEL
ALT: 21 U/L (ref 0–35)
BUN: 17 mg/dL (ref 6–23)
CO2: 20 mEq/L (ref 19–32)
Calcium: 8.1 mg/dL — ABNORMAL LOW (ref 8.4–10.5)
Creatinine, Ser: 0.72 mg/dL (ref 0.50–1.10)
GFR calc Af Amer: 86 mL/min — ABNORMAL LOW (ref >90)
GFR calc non Af Amer: 74 mL/min — ABNORMAL LOW (ref >90)
Glucose, Bld: 189 mg/dL — ABNORMAL HIGH (ref 70–99)

## 2012-09-26 LAB — CBC WITH DIFFERENTIAL/PLATELET
Basophils Absolute: 0 10*3/uL (ref 0.0–0.1)
Basophils Relative: 0 % (ref 0–1)
Hemoglobin: 11.2 g/dL — ABNORMAL LOW (ref 12.0–15.0)
Lymphocytes Relative: 6 % — ABNORMAL LOW (ref 12–46)
MCHC: 34.8 g/dL (ref 30.0–36.0)
Monocytes Relative: 4 % (ref 3–12)
Neutro Abs: 10.3 10*3/uL — ABNORMAL HIGH (ref 1.7–7.7)
Neutrophils Relative %: 90 % — ABNORMAL HIGH (ref 43–77)
RBC: 3.6 MIL/uL — ABNORMAL LOW (ref 3.87–5.11)
WBC: 11.4 10*3/uL — ABNORMAL HIGH (ref 4.0–10.5)

## 2012-09-26 LAB — HEMOGLOBIN A1C: Hgb A1c MFr Bld: 6.2 % — ABNORMAL HIGH (ref <5.7)

## 2012-09-26 NOTE — Progress Notes (Signed)
PT Cancellation Note  Patient Details Name: GWENETTE WELLONS MRN: [id number] DOB: 1922-07-14   Cancelled Treatment:    Reason Eval/Treat Not Completed: Medical issues which prohibited therapy. K+: 3.0. Per therapeutic guidelines K+ replenishment must be initiated prior to PT. Spoke with RN. Will f/u as time and schedule allow.   [facility] HELEN 09/26/2012, 11:36 AM Pager: [phone]

## 2012-09-26 NOTE — Progress Notes (Signed)
[facility]             Progress Note   Subjective: Less SOB today. Wants to walk.  Objective: Vital signs in last 24 hours: Temp:  [98 F (36.7 C)-100.7 F (38.2 C)] 98.3 F (36.8 C) (01/01 0601) Pulse Rate:  [68-89] 68  (01/01 1033) Resp:  [20-32] 23  (01/01 0601) BP: (111-147)/(46-83) 112/52 mmHg (01/01 1033) SpO2:  [87 %-97 %] 93 % (01/01 1229) Weight:  [63.4 kg (139 lb 12.4 oz)-63.957 kg (141 lb)] 63.4 kg (139 lb 12.4 oz) (12/31 2314) Weight change:  Last BM Date: 09/24/12  Intake/Output from previous day: 12/31 0701 - 01/01 0700 In: 3 [I.V.:3] Out: 100 [Urine:100] Total I/O In: 120 [P.O.:120] Out: -    Physical Exam: General: Alert, awake, oriented x3, in no acute distress. HEENT: No bruits, no goiter. Heart: Regular rate and rhythm, without murmurs, rubs, gallops. Lungs: Clear to auscultation bilaterally. Abdomen: Soft, nontender, nondistended, positive bowel sounds. Extremities: No clubbing cyanosis or edema with positive pedal pulses. Neuro: Grossly intact, nonfocal.    Lab Results: Basic Metabolic Panel:  Basename 09/26/12 0620 09/25/12 2126 09/25/12 1835  NA 138 -- 132*  K 3.0* -- 3.1*  CL 105 -- 95*  CO2 20 -- 22  GLUCOSE 189* -- 125*  BUN 17 -- 23  CREATININE 0.72 0.85 --  CALCIUM 8.1* -- 9.4  MG -- -- --  PHOS -- -- --   Liver Function Tests:  [facility] 09/26/12 0620 09/25/12 1835  AST 28 30  ALT 21 25  ALKPHOS 68 75  BILITOT 0.4 0.4  PROT 6.0 7.4  ALBUMIN 2.6* 3.5   CBC:  Basename 09/26/12 0620 09/25/12 2126 09/25/12 1835  WBC 11.4* 9.4 --  NEUTROABS 10.3* -- 8.6*  HGB 11.2* 10.4* --  HCT 32.2* 30.8* --  MCV 89.4 90.1 --  PLT 205 183 --   Hemoglobin A1C:  Basename 09/26/12 0620  HGBA1C 6.2*   Urinalysis:  Basename 09/25/12 2006  COLORURINE YELLOW  LABSPEC 1.027  PHURINE 6.0  GLUCOSEU NEGATIVE  HGBUR NEGATIVE  BILIRUBINUR NEGATIVE  KETONESUR 40*  PROTEINUR 30*  UROBILINOGEN 0.2  NITRITE NEGATIVE    LEUKOCYTESUR LARGE*    Studies/Results: Dg Chest 2 View  09/25/2012  *RADIOLOGY REPORT*  Clinical Data: Productive cough and shortness of breath  CHEST - 2 VIEW  Comparison: 07/06/2011  Findings: Hyperexpansion is consistent with emphysema.  The right lung is clear.  There is some atelectasis or infiltrate at the left base with a small left pleural effusion. Cardiopericardial silhouette is at upper limits of normal for size.  Left dual lead permanent pacemaker remains in place.  Bones are diffusely demineralized.  IMPRESSION: Emphysema with atelectasis or infiltrate at the left base and a tiny left pleural effusion.   Original Report Authenticated By: Kennith Center, M.D.     Medications: Scheduled Meds:   . albuterol  2.5 mg Nebulization Q4H  . amLODipine  10 mg Oral Daily  . azithromycin  250 mg Intravenous Q24H  . calcium-vitamin D  2 tablet Oral Daily  . dipyridamole-aspirin  1 capsule Oral BID  . donepezil  10 mg Oral QHS  . folic acid-pyridoxine-cyancobalamin  1 tablet Oral Daily  . heparin  5,000 Units Subcutaneous Q8H  . ipratropium  0.5 mg Nebulization Q6H  . irbesartan  300 mg Oral Daily  . levothyroxine  125 mcg Oral QAC breakfast  . methylPREDNISolone sodium succinate  125 mg Intravenous Q6H  . oseltamivir  75  mg Oral BID  . sodium chloride  3 mL Intravenous Q12H   Continuous Infusions:   . sodium chloride 125 mL/hr at 09/25/12 2046  . 0.9 % NaCl with KCl 20 mEq / L 125 mL/hr at 09/26/12 0902   PRN Meds:.acetaminophen, acetaminophen, albuterol, guaiFENesin-dextromethorphan, ondansetron (ZOFRAN) IV, ondansetron  Assessment/Plan:  Principal Problem:  *PNA (pneumonia) Active Problems:  UTI (lower urinary tract infection)  Hypokalemia  Leukocytosis   Strep Pneumo PNA -strep pneumo urinary antigen is positive. -Continue rocephin/azithro. -Can transition to PO levaquin at time of DC to complete 10 days of treatment. -She is symptomatically improved today. -Flu  screen is negative.  UTI -Rocephin should cover. -Await cx data.  Hypokalemia -Replete PO. -Check Mg level and replete as necessary.  Leukocytosis -2/2 PNA and UTI. -Follow with antibiotics.  Time spent coordinating care: 35 minutes.   LOS: 1 day   [facility] Pager: [phone] 09/26/2012, 2:32 PM

## 2012-09-27 DIAGNOSIS — E876 Hypokalemia: Secondary | ICD-10-CM

## 2012-09-27 DIAGNOSIS — Z950 Presence of cardiac pacemaker: Secondary | ICD-10-CM

## 2012-09-27 DIAGNOSIS — R0902 Hypoxemia: Secondary | ICD-10-CM

## 2012-09-27 LAB — BASIC METABOLIC PANEL
CO2: 18 mEq/L — ABNORMAL LOW (ref 19–32)
Calcium: 7.8 mg/dL — ABNORMAL LOW (ref 8.4–10.5)
Chloride: 108 mEq/L (ref 96–112)
Creatinine, Ser: 0.79 mg/dL (ref 0.50–1.10)
Glucose, Bld: 202 mg/dL — ABNORMAL HIGH (ref 70–99)

## 2012-09-27 LAB — CBC
HCT: 29.9 % — ABNORMAL LOW (ref 36.0–46.0)
MCH: 31.7 pg (ref 26.0–34.0)
MCV: 88.5 fL (ref 78.0–100.0)
Platelets: 213 10*3/uL (ref 150–400)
RDW: 13.6 % (ref 11.5–15.5)
WBC: 13.2 10*3/uL — ABNORMAL HIGH (ref 4.0–10.5)

## 2012-09-27 LAB — LEGIONELLA ANTIGEN, URINE: Legionella Antigen, Urine: NEGATIVE

## 2012-09-27 MED ORDER — ALBUTEROL SULFATE (5 MG/ML) 0.5% IN NEBU
2.5000 mg | INHALATION_SOLUTION | Freq: Four times a day (QID) | RESPIRATORY_TRACT | Status: DC
  Administered 2012-09-28 – 2012-09-29 (×6): 2.5 mg via RESPIRATORY_TRACT
  Filled 2012-09-27 (×7): qty 0.5

## 2012-09-27 MED ORDER — METHYLPREDNISOLONE SODIUM SUCC 125 MG IJ SOLR
80.0000 mg | Freq: Two times a day (BID) | INTRAMUSCULAR | Status: DC
  Administered 2012-09-27: 80 mg via INTRAVENOUS
  Filled 2012-09-27 (×3): qty 1.28

## 2012-09-27 NOTE — Progress Notes (Signed)
[facility]             Progress Note   Subjective: Feels nervous about going home to be alone.  Objective: Vital signs in last 24 hours: Temp:  [98 F (36.7 C)-98.2 F (36.8 C)] 98 F (36.7 C) (01/02 0546) Pulse Rate:  [68-83] 73  (01/02 0546) Resp:  [22] 22  (01/02 0522) BP: (107-124)/(52-79) 118/64 mmHg (01/02 1015) SpO2:  [91 %-95 %] 92 % (01/02 1138) Weight change:  Last BM Date: 09/27/12  Intake/Output from previous day: 01/01 0701 - 01/02 0700 In: 3485 [P.O.:360; I.V.:3000; IV Piggyback:125] Out: 200 [Urine:200] Total I/O In: 240 [P.O.:240] Out: -    Physical Exam: General: Alert, awake, oriented x3. HEENT: No bruits, no goiter. Heart: Regular rate and rhythm, without murmurs, rubs, gallops. Lungs: Clear to auscultation bilaterally. Abdomen: Soft, nontender, nondistended, positive bowel sounds. Extremities: No clubbing cyanosis or edema with positive pedal pulses. Neuro: Grossly intact, nonfocal.    Lab Results: Basic Metabolic Panel:  Basename 09/27/12 0505 09/26/12 0620  NA 139 138  K 3.4* 3.0*  CL 108 105  CO2 18* 20  GLUCOSE 202* 189*  BUN 23 17  CREATININE 0.79 0.72  CALCIUM 7.8* 8.1*  MG -- --  PHOS -- --   Liver Function Tests:  [facility] 09/26/12 0620 09/25/12 1835  AST 28 30  ALT 21 25  ALKPHOS 68 75  BILITOT 0.4 0.4  PROT 6.0 7.4  ALBUMIN 2.6* 3.5   CBC:  Basename 09/27/12 0505 09/26/12 0620 09/25/12 1835  WBC 13.2* 11.4* --  NEUTROABS -- 10.3* 8.6*  HGB 10.7* 11.2* --  HCT 29.9* 32.2* --  MCV 88.5 89.4 --  PLT 213 205 --   Hemoglobin A1C:  Basename 09/26/12 0620  HGBA1C 6.2*   Urinalysis:  Basename 09/25/12 2006  COLORURINE YELLOW  LABSPEC 1.027  PHURINE 6.0  GLUCOSEU NEGATIVE  HGBUR NEGATIVE  BILIRUBINUR NEGATIVE  KETONESUR 40*  PROTEINUR 30*  UROBILINOGEN 0.2  NITRITE NEGATIVE  LEUKOCYTESUR LARGE*    Studies/Results: Dg Chest 2 View  09/25/2012  *RADIOLOGY REPORT*  Clinical Data:  Productive cough and shortness of breath  CHEST - 2 VIEW  Comparison: 07/06/2011  Findings: Hyperexpansion is consistent with emphysema.  The right lung is clear.  There is some atelectasis or infiltrate at the left base with a small left pleural effusion. Cardiopericardial silhouette is at upper limits of normal for size.  Left dual lead permanent pacemaker remains in place.  Bones are diffusely demineralized.  IMPRESSION: Emphysema with atelectasis or infiltrate at the left base and a tiny left pleural effusion.   Original Report Authenticated By: Kennith Center, M.D.     Medications: Scheduled Meds:    . albuterol  2.5 mg Nebulization Q4H  . amLODipine  10 mg Oral Daily  . azithromycin  250 mg Intravenous Q24H  . calcium-vitamin D  2 tablet Oral Daily  . dipyridamole-aspirin  1 capsule Oral BID  . donepezil  10 mg Oral QHS  . folic acid-pyridoxine-cyancobalamin  1 tablet Oral Daily  . heparin  5,000 Units Subcutaneous Q8H  . ipratropium  0.5 mg Nebulization Q6H  . irbesartan  300 mg Oral Daily  . levothyroxine  125 mcg Oral QAC breakfast  . methylPREDNISolone sodium succinate  80 mg Intravenous Q12H  . oseltamivir  75 mg Oral BID  . sodium chloride  3 mL Intravenous Q12H   Continuous Infusions:    . 0.9 % NaCl with KCl 20 mEq / L 125  mL/hr at 09/27/12 0301   PRN Meds:.acetaminophen, acetaminophen, albuterol, guaiFENesin-dextromethorphan, ondansetron (ZOFRAN) IV, ondansetron  Assessment/Plan:  Principal Problem:  *PNA (pneumonia) Active Problems:  UTI (lower urinary tract infection)  Hypokalemia  Leukocytosis   Strep Pneumo PNA -strep pneumo urinary antigen is positive. -Continue rocephin/azithro. -Can transition to PO levaquin at time of DC to complete 10 days of treatment. -She is symptomatically improved today. -Flu screen is negative.  UTI -Rocephin should cover. -Await cx data.  Hypokalemia -Replete PO. -Check Mg level and replete as  necessary.  Leukocytosis -2/2 PNA and UTI as well as steroids. Will start tapering steroids.Marland Kitchen??indication. -Follow with antibiotics.  Time spent coordinating care: 35 minutes.   LOS: 2 days   [facility] Pager: [phone] 09/27/2012, 11:52 AM

## 2012-09-27 NOTE — Clinical Social Work Placement (Addendum)
    Clinical Social Work Department CLINICAL SOCIAL WORK PLACEMENT NOTE 09/27/2012  Patient:  [name], [name]  Account Number:  [id number] Admit date:  09/25/2012  Clinical Social Worker:  Margaree Mackintosh  Date/time:  09/27/2012 05:01 PM  Clinical Social Work is seeking post-discharge placement for this patient at the following level of care:   SKILLED NURSING   (*CSW will update this form in Epic as items are completed)   09/27/2012  Patient/family provided with [facility] Department of Clinical Social Work's list of facilities offering this level of care within the geographic area requested by the patient (or if unable, by the patient's family).  09/27/2012  Patient/family informed of their freedom to choose among providers that offer the needed level of care, that participate in Medicare, Medicaid or managed care program needed by the patient, have an available bed and are willing to accept the patient.  09/27/2012  Patient/family informed of [facility]' ownership interest in [facility], as well as of the fact that they are under no obligation to receive care at this facility.  PASARR submitted to EDS on 09/27/2012 PASARR number received from EDS on 09/27/2012  FL2 transmitted to all facilities in geographic area requested by pt/family on  09/27/2012 FL2 transmitted to all facilities within larger geographic area on   Patient informed that his/her managed care company has contracts with or will negotiate with  certain facilities, including the following:     Patient/family informed of bed offers received:09/28/12   Patient chooses bed at [facility] Physician recommends and patient chooses bed at  [facility]  Patient to be transferred to [place] on  09/30/11 Patient to be transferred to facility by [facility]  The following physician request were entered in Epic:   Additional Comments:

## 2012-09-27 NOTE — Progress Notes (Signed)
Physical Therapy Evaluation Patient Details Name: [name] MRN: [id number] DOB: [birth year] Today's Date: 09/27/2012 Time: 4098-1191 PT Time Calculation (min): 25 min  PT Assessment / Plan / Recommendation Clinical Impression  Pt is an 79 y/o female admitted with pneumonia.  Pt lives alone in apartment with daughter in law checking on her intermittently.  Pt appears to be more confused today than usually per daughter in law.  Pt doing well  with mobility.  Pt safety may be a concern if cognitive deficits continue.  SpO2 on 4 liters of O2 via Alpha 92% at rest and 89-93% while ambulating.    Acute Pt will continue to follow pt to insure safety and improve activity tolerance.  Pt will benefit from HHPT consult.  No DME need identified.        PT Assessment  Patient needs continued PT services    Follow Up Recommendations  Home health PT;Supervision/Assistance - 24 hour    Does the patient have the potential to tolerate intense rehabilitation      Barriers to Discharge Decreased caregiver support Does not have 24/7 supervision available.    Equipment Recommendations  None recommended by PT    Recommendations for Other Services OT consult   Frequency Min 3X/week    Precautions / Restrictions Precautions Precautions: Fall Precaution Comments: Pt had a fall in 2012 Restrictions Weight Bearing Restrictions: No   Pertinent Vitals/Pain No c/o pain       Mobility  Bed Mobility Bed Mobility: Supine to Sit;Sitting - Scoot to Edge of Bed Supine to Sit: 6: Modified independent (Device/Increase time);HOB elevated Sitting - Scoot to Edge of Bed: 6: Modified independent (Device/Increase time);With rail Details for Bed Mobility Assistance: No physical assistance or instruction required. Pt verbalizing need to sit on EOB for a minute prior to standing to avoid dizziness.  Transfers Transfers: Sit to Stand;Stand to Sit Sit to Stand: 5: Supervision;From bed;With upper extremity  assist Stand to Sit: 5: Supervision;To chair/3-in-1;With upper extremity assist Details for Transfer Assistance: Supervison for safety.  No cueing or assistance required.  Ambulation/Gait Ambulation/Gait Assistance: 4: Min guard Ambulation Distance (Feet): 150 Feet Assistive device: Rolling walker Ambulation/Gait Assistance Details: Min guard assist for safety with change in direction as pt takes very smalll steps and leans back.  Daughter reports pt has 4 wheeled walker with swivel wheels and has less difficulty changing direction.   Gait Pattern: Decreased stride length;Decreased step length - left;Decreased step length - right;Shuffle Gait velocity: Slow  General Gait Details: several standing rest breaks for pt to "catch her breath" Stairs: No Wheelchair Mobility Wheelchair Mobility: No    Shoulder Instructions     Exercises     PT Diagnosis: Altered mental status;Abnormality of gait  PT Problem List: Decreased activity tolerance;Cardiopulmonary status limiting activity PT Treatment Interventions: Gait training;Therapeutic activities;Therapeutic exercise;Patient/family education;Cognitive remediation;DME instruction   PT Goals Acute Rehab PT Goals PT Goal Formulation: With patient/family Time For Goal Achievement: 10/04/12 Potential to Achieve Goals: Good Pt will Transfer Bed to Chair/Chair to Bed: with modified independence PT Transfer Goal: Bed to Chair/Chair to Bed - Progress: Goal set today Pt will Ambulate: >150 feet;with modified independence;with rolling walker (4 wheeled walker) PT Goal: Ambulate - Progress: Goal set today  Visit Information  Last PT Received On: 09/27/12    Subjective Data  Subjective: I am determined! Patient Stated Goal: Return to home.     Prior Functioning  Home Living Lives With: Alone Available Help at Discharge: Available PRN/intermittently;Family Type  of Home: Apartment Home Access: Level entry Home Layout: One level Bathroom  Accessibility: Yes How Accessible: Accessible via walker Home Adaptive Equipment: Walker - rolling;Walker - four wheeled;Straight cane Prior Function Level of Independence: Independent with assistive device(s) Driving: No Vocation: Retired Musician: Clinical cytogeneticist  Overall Cognitive Status: Impaired Area of Impairment: Memory Arousal/Alertness: Awake/alert Orientation Level: Disoriented to;Situation Behavior During Session: Anxious Memory Deficits: Pt talking about her dog at home and that the reason she is in the hospital is that she fell.  Pt's dog is deceased and pt's fall was over a year ago (06/2011) Cognition - Other Comments: daughter reporting that pt is more confused than usual.  Reports that pt takes medication for her cognition at home and can be confused if not taking them.     Extremity/Trunk Assessment Right Upper Extremity Assessment RUE ROM/Strength/Tone: Within functional levels Left Upper Extremity Assessment LUE ROM/Strength/Tone: Within functional levels Right Lower Extremity Assessment RLE ROM/Strength/Tone: Within functional levels Left Lower Extremity Assessment LLE ROM/Strength/Tone: Within functional levels Trunk Assessment Trunk Assessment: Normal   Balance Balance Balance Assessed: Yes Static Sitting Balance Static Sitting - Balance Support: Feet supported Static Sitting - Level of Assistance: 6: Modified independent (Device/Increase time) Static Sitting - Comment/# of Minutes: 2+minutes sitting on EOB with no LOB. Static Standing Balance Static Standing - Balance Support: Bilateral upper extremity supported Static Standing - Level of Assistance: 5: Stand by assistance Static Standing - Comment/# of Minutes: 1 minute with RW. Initial posterior lean but able to correct independently.    End of Session PT - End of Session Equipment Utilized During Treatment: Gait belt;Oxygen Activity Tolerance: Patient tolerated treatment  well Patient left: in chair;with call bell/phone within reach;with family/visitor present Nurse Communication: Mobility status;Other (comment) (Daughter in law request to review pt's daily meds. )  GP     Alferd Apa 09/27/2012, 12:42 PM Leviticus Harton L. Iria Jamerson DPT [phone]

## 2012-09-27 NOTE — Progress Notes (Signed)
Inpatient Diabetes Program Recommendations  AACE/ADA: New Consensus Statement on Inpatient Glycemic Control (2013)  Target Ranges:  Prepandial:   less than 140 mg/dL      Peak postprandial:   less than 180 mg/dL (1-2 hours)      Critically ill patients:  140 - 180 mg/dL   Reason for Visit: Results for [name], [name] (MRN [id number]) as of 09/27/2012 11:23  Ref. Range 09/26/2012 06:20 09/27/2012 05:05  Glucose Latest Range: 70-99 mg/dL 409 (H) 811 (H)   Note lab glucose elevated.  Consider checking CBG tid wc and HS with sensitive correction while on steroids.

## 2012-09-27 NOTE — Care Management Note (Unsigned)
    Page 1 of 1   09/27/2012     3:07:05 PM   CARE MANAGEMENT NOTE 09/27/2012  Patient:  [name], [name]   Account Number:  [id number]  Date Initiated:  09/27/2012  Documentation initiated by:  GRAVES-BIGELOW,Adalee Kathan  Subjective/Objective Assessment:   Pt admitted with low 02 sats. Treating for PNA. Pt is from home alone.     Action/Plan:   CM will discuss disposition needs with pt. PT recommends HH PT. CM did speak to pt and she is [name] little confused per RN. CM will try to contact son.   Anticipated DC Date:  09/29/2012   Anticipated DC Plan:  HOME W HOME HEALTH SERVICES      DC Planning Services  CM consult      Choice offered to / List presented to:             Status of service:  In process, will continue to follow Medicare Important Message given?   (If response is "NO", the following Medicare IM given date fields will be blank) Date Medicare IM given:   Date Additional Medicare IM given:    Discharge Disposition:    Per UR Regulation:  Reviewed for med. necessity/level of care/duration of stay  If discussed at Long Length of Stay Meetings, dates discussed:    Comments:

## 2012-09-27 NOTE — Clinical Social Work Psychosocial (Signed)
     Clinical Social Work Department BRIEF PSYCHOSOCIAL ASSESSMENT 09/27/2012  Patient:  JULIYA, MAGILL     Account Number:  [id number]     Admit date:  09/25/2012  Clinical Social Worker:  Margaree Mackintosh  Date/Time:  09/27/2012 04:58 PM  Referred by:  Physician  Date Referred:  09/27/2012 Referred for  SNF Placement   Other Referral:   Interview type:  Patient Other interview type:   With Robyn Haber Present.    PSYCHOSOCIAL DATA Living Status:  ALONE Admitted from facility:   Level of care:   Primary support name:  Jovi Alvizo: [phone] Primary support relationship to patient:  CHILD, ADULT Degree of support available:   Adequate.    CURRENT CONCERNS Current Concerns  Post-Acute Placement   Other Concerns:    SOCIAL WORK ASSESSMENT / PLAN Clinical Social Worker recieved referral for potential SNF at dc.  CSW reviewed chart and met with pt and son at bedside. CSW introduced self, explained role, and provided support. CSW reviewed recommendations for SNF; pt and son agreeable and expressed interest in [facility] at first choice.  Pt and son open to searching [facility] for a back up.  CSW to submit appropriate clinicals for SNF search.   Assessment/plan status:  Information/Referral to [facility] Other assessment/ plan:   Information/referral to community resources:   SNF    PATIENTS/FAMILYS RESPONSE TO PLAN OF CARE: Pt and son were pleasant and engaged in conversation. pt and son thanked CSW for intervention.

## 2012-09-27 NOTE — Progress Notes (Signed)
Attempted to wean patient off oxygen but she desaturated to 82-85% on room air,Sat to the  90's after being back on oxygen 4l via nasal canula. Dr Ardyth Harps notified. Will continue to monitor.

## 2012-09-27 NOTE — Progress Notes (Signed)
UR Completed Octavis Sheeler Graves-Bigelow, RN,BSN [phone]  

## 2012-09-28 ENCOUNTER — Inpatient Hospital Stay (HOSPITAL_COMMUNITY): Payer: Medicare Other

## 2012-09-28 DIAGNOSIS — D72829 Elevated white blood cell count, unspecified: Secondary | ICD-10-CM

## 2012-09-28 LAB — BASIC METABOLIC PANEL
BUN: 26 mg/dL — ABNORMAL HIGH (ref 6–23)
CO2: 19 mEq/L (ref 19–32)
Calcium: 8 mg/dL — ABNORMAL LOW (ref 8.4–10.5)
Chloride: 109 mEq/L (ref 96–112)
Creatinine, Ser: 0.82 mg/dL (ref 0.50–1.10)
GFR calc Af Amer: 72 mL/min — ABNORMAL LOW (ref >90)

## 2012-09-28 LAB — CBC
HCT: 31.8 % — ABNORMAL LOW (ref 36.0–46.0)
MCHC: 34.3 g/dL (ref 30.0–36.0)
MCV: 89.6 fL (ref 78.0–100.0)
Platelets: 284 10*3/uL (ref 150–400)
RDW: 13.8 % (ref 11.5–15.5)
WBC: 14 10*3/uL — ABNORMAL HIGH (ref 4.0–10.5)

## 2012-09-28 MED ORDER — POTASSIUM CHLORIDE 20 MEQ/15ML (10%) PO LIQD
40.0000 meq | ORAL | Status: AC
  Administered 2012-09-28 (×2): 40 meq via ORAL
  Filled 2012-09-28 (×2): qty 30

## 2012-09-28 MED ORDER — METHYLPREDNISOLONE SODIUM SUCC 125 MG IJ SOLR
80.0000 mg | INTRAMUSCULAR | Status: DC
  Administered 2012-09-28: 80 mg via INTRAVENOUS
  Filled 2012-09-28 (×2): qty 1.28

## 2012-09-28 MED ORDER — LEVOFLOXACIN 750 MG PO TABS
750.0000 mg | ORAL_TABLET | ORAL | Status: DC
  Administered 2012-09-28: 750 mg via ORAL
  Filled 2012-09-28: qty 1

## 2012-09-28 MED ORDER — FUROSEMIDE 10 MG/ML IJ SOLN
40.0000 mg | Freq: Once | INTRAMUSCULAR | Status: AC
  Administered 2012-09-28: 40 mg via INTRAVENOUS
  Filled 2012-09-28: qty 4

## 2012-09-28 MED ORDER — LEVOFLOXACIN 750 MG PO TABS: 750.0000 mg | ORAL_TABLET | Freq: Every day | ORAL | Status: DC

## 2012-09-28 NOTE — Progress Notes (Signed)
Clinical Psychologist, occupational summary to [employer]. Pt to dc on 09/30/11.      Angelia Mould, MSW, [place] [phone]

## 2012-09-28 NOTE — Progress Notes (Signed)
Patient seen and examined. Agree with note by Toya Smothers, NP. Patient has been diagnosed with a strep PNA. Has been transitioned over to levaquin to take for 10 days. PT has recommended SNF. SW has arranged for a DC over the weekend as we anticipate she will be medically ready then. Plan discussed in detail with son Leonette Most.  Peggye Pitt, MD [facility] Pager: [phone]

## 2012-09-28 NOTE — Discharge Summary (Signed)
Physician Discharge Summary  [name]:[id number] DOB: 1921-11-25 DOA: 09/25/2012  PCP: Oliver Barre, MD  Admit date: 09/25/2012 Discharge date: 09/29/2012  Time spent: 35 minutes  Recommendations for Outpatient Follow-up:  1. Follow up with PCP 1 week  Discharge Diagnoses:  Principal Problem:  *PNA (pneumonia) Active Problems:  UTI (lower urinary tract infection)  Hypokalemia  Leukocytosis   Discharge Condition: stable  Diet recommendation: heart healthy  Filed Weights   09/25/12 2314 09/28/12 0528 09/29/12 0507  Weight: 63.4 kg (139 lb 12.4 oz) 67.586 kg (149 lb) 65.409 kg (144 lb 3.2 oz)    History of present illness:  [name] is a 79 y.o. year old woman who presented to ED on 09/25/12 with progressive shortness of breath, cough, and fever x 2 days. Tmax at home was 100.3. Pt was seen by PCP for sxs earlier that day. Pt was sent from PCPs to ED where pt was noted to be hypoxic in low 90s in addition to wheezing on lung exam. In ED pt received albuterol/atrovent nebs as well as IV solumedrol and supplemental O2with subsequent improvement in sxs. Pt also obtained a CXR which showed emphysematous changes as well as questionable infiltrate in LLL. Pt denied any prior smoking history or hx/o asthma/albuterol use. Pt had her flu shot this year. No other recent infections per pt. Last hospitalization was 06/2011. Pt reported multiple sick contacts with similar sxs. Significan PMHx includes HTN, hx/o CVA with secondary mild aphasia as well as complete AV block s/p pacemaker. Per son, pt does live in supervised housing, however this is not an ALF or SNF.      Hospital Course:  Strep Pneumo PNA  -strep pneumo urinary antigen is positive.  -Initially Rocephin/azithromycin. After 2 days transitioned to levaquin to complete 10 day course to end 10/04/12. Afebrile, non-toxic appearing.  -Flu screen is negative.  -blood culture neg to date  Hypoxia  -likely related to  #1. Repeat chest xray on 09/28/12 yields no improvement. Given one dose lasix. Fine crackles auscultated on right.  -continue oxygen support and monitor sats  UTI  -Rocephin given 09/25/13  -urine culture with multiple morphotypes none predominant  Hypokalemia  -Repleted and continued to be low. Mag level 2.2  Leukocytosis  -2/2 PNA and UTI as well as steroids. Quick taper of steroids. Inially received 125 every 6 hours.   -Follow with antibiotics.  -afebrile non-toxic appearing      Procedures:  none  Consultations:  none  Discharge Exam: Filed Vitals:   09/28/12 2125 09/29/12 0217 09/29/12 0507 09/29/12 0928  BP:   183/86   Pulse:   76   Temp:   97.7 F (36.5 C)   TempSrc:   Oral   Resp:      Height:      Weight:   65.409 kg (144 lb 3.2 oz)   SpO2: 96% 92% 94% 97%    General: awake alert NAD Cardiovascular: RRR No MGR No LEE Respiratory: normal effort.   Discharge Instructions      Discharge Orders    Future Appointments: Provider: Department: Dept Phone: Center:   12/03/2012 10:30 AM [facility] [employer] [facility] [place]) [phone] [facility]   01/08/2013 10:45 AM Corwin Levins, MD [facility] [phone] [facility]     Future Orders Please Complete By Expires   Diet - low sodium heart healthy      Increase activity slowly      Discontinue IV  Medication List     As of 09/29/2012  9:57 AM    STOP taking these medications         hydrochlorothiazide 12.5 MG capsule   Commonly known as: MICROZIDE      valsartan 320 MG tablet   Commonly known as: DIOVAN      TAKE these medications         amLODipine 10 MG tablet   Commonly known as: NORVASC   Take 1 tablet (10 mg total) by mouth daily.      atorvastatin 10 MG tablet   Commonly known as: LIPITOR   Take 1 tablet (10 mg total) by mouth daily.      CALTRATE 600+D 600-400 MG-UNIT per tablet   Generic drug: Calcium Carbonate-Vitamin D     Take 1 tablet by mouth daily.      dipyridamole-aspirin 200-25 MG per 12 hr capsule   Commonly known as: AGGRENOX   Take 1 capsule by mouth 2 (two) times daily.      donepezil 10 MG tablet   Commonly known as: ARICEPT   Take 1 tablet (10 mg total) by mouth daily.      estradiol 0.1 MG/GM vaginal cream   Commonly known as: ESTRACE   Apply two times per week      folic acid-pyridoxine-cyancobalamin 2.5-25-2 MG Tabs   Commonly known as: FOLTX   Take 1 tablet by mouth daily.      guaiFENesin-dextromethorphan 100-10 MG/5ML syrup   Commonly known as: ROBITUSSIN DM   Take 5 mLs by mouth every 4 (four) hours as needed for cough.      irbesartan 300 MG tablet   Commonly known as: AVAPRO   Take 1 tablet (300 mg total) by mouth daily.      levofloxacin 750 MG tablet   Commonly known as: LEVAQUIN   Take 1 tablet (750 mg total) by mouth every other day.      levothyroxine 125 MCG tablet   Commonly known as: SYNTHROID, LEVOTHROID   Take 1 tablet (125 mcg total) by mouth daily.        Follow-up Information    Follow up with Oliver Barre, MD. Schedule an appointment as soon as possible for a visit in 2 weeks.   Contact information:   [address] [place] [address] [phone]           The results of significant diagnostics from this hospitalization (including imaging, microbiology, ancillary and laboratory) are listed below for reference.    Significant Diagnostic Studies: Dg Chest 2 View  09/25/2012  *RADIOLOGY REPORT*  Clinical Data: Productive cough and shortness of breath  CHEST - 2 VIEW  Comparison: 07/06/2011  Findings: Hyperexpansion is consistent with emphysema.  The right lung is clear.  There is some atelectasis or infiltrate at the left base with a small left pleural effusion. Cardiopericardial silhouette is at upper limits of normal for size.  Left dual lead permanent pacemaker remains in place.  Bones are diffusely demineralized.   IMPRESSION: Emphysema with atelectasis or infiltrate at the left base and a tiny left pleural effusion.   Original Report Authenticated By: Kennith Center, M.D.    Dg Chest Port 1 View  09/28/2012  *RADIOLOGY REPORT*  Clinical Data: Pneumonia and hypoxemia.  PORTABLE CHEST - 1 VIEW  Comparison: 09/25/2012  Findings: Single view of the chest was obtained.  There are increased densities at both lung bases.  Findings are concerning  for airspace disease.  Stable appearance of the left cardiac pacemaker.  The aortic arch is heavily calcified.  Heart size is normal for size.  No evidence for a pneumothorax.  IMPRESSION: Increased bibasilar densities are concerning for atelectasis and/or pneumonia.   Original Report Authenticated By: Richarda Overlie, M.D.     Microbiology: Recent Results (from the past 240 hour(s))  CULTURE, BLOOD (ROUTINE X 2)     Status: Normal (Preliminary result)   Collection Time   09/25/12  6:35 PM      Component Value Range Status Comment   Specimen Description BLOOD ARM RIGHT   Final    Special Requests BOTTLES DRAWN AEROBIC AND ANAEROBIC 5CC   Final    Culture  Setup Time 09/26/2012 02:17   Final    Culture     Final    Value:        BLOOD CULTURE RECEIVED NO GROWTH TO DATE CULTURE WILL BE HELD FOR 5 DAYS BEFORE ISSUING A FINAL NEGATIVE REPORT   Report Status PENDING   Incomplete   CULTURE, BLOOD (ROUTINE X 2)     Status: Normal (Preliminary result)   Collection Time   09/25/12  6:55 PM      Component Value Range Status Comment   Specimen Description BLOOD ARM LEFT   Final    Special Requests BOTTLES DRAWN AEROBIC AND ANAEROBIC 10CC   Final    Culture  Setup Time 09/26/2012 02:17   Final    Culture     Final    Value:        BLOOD CULTURE RECEIVED NO GROWTH TO DATE CULTURE WILL BE HELD FOR 5 DAYS BEFORE ISSUING A FINAL NEGATIVE REPORT   Report Status PENDING   Incomplete   URINE CULTURE     Status: Normal   Collection Time   09/25/12  8:06 PM      Component Value Range Status  Comment   Specimen Description URINE, CLEAN CATCH   Final    Special Requests NONE   Final    Culture  Setup Time 09/26/2012 02:53   Final    Colony Count 50,000 COLONIES/ML   Final    Culture     Final    Value: Multiple bacterial morphotypes present, none predominant. Suggest appropriate recollection if clinically indicated.   Report Status 09/27/2012 FINAL   Final      Labs: Basic Metabolic Panel:  Lab 09/29/12 4098 09/28/12 0610 09/27/12 0505 09/26/12 0620 09/25/12 2126 09/25/12 1835  NA 137 142 139 138 -- 132*  K 3.6 3.2* 3.4* 3.0* -- 3.1*  CL 103 109 108 105 -- 95*  CO2 23 19 18* 20 -- 22  GLUCOSE 189* 185* 202* 189* -- 125*  BUN 28* 26* 23 17 -- 23  CREATININE 0.90 0.82 0.79 0.72 0.85 --  CALCIUM 8.2* 8.0* 7.8* 8.1* -- 9.4  MG -- 2.2 -- -- -- --  PHOS -- -- -- -- -- --   Liver Function Tests:  Lab 09/26/12 0620 09/25/12 1835  AST 28 30  ALT 21 25  ALKPHOS 68 75  BILITOT 0.4 0.4  PROT 6.0 7.4  ALBUMIN 2.6* 3.5   No results found for this basename: LIPASE:5,AMYLASE:5 in the last 168 hours No results found for this basename: AMMONIA:5 in the last 168 hours CBC:  Lab 09/29/12 0420 09/28/12 0610 09/27/12 0505 09/26/12 0620 09/25/12 2126 09/25/12 1835  WBC 10.4 14.0* 13.2* 11.4* 9.4 --  NEUTROABS -- -- -- 10.3* --  8.6*  HGB 11.2* 10.9* 10.7* 11.2* 10.4* --  HCT 33.1* 31.8* 29.9* 32.2* 30.8* --  MCV 88.7 89.6 88.5 89.4 90.1 --  PLT 280 284 213 205 183 --   Cardiac Enzymes: No results found for this basename: CKTOTAL:5,CKMB:5,CKMBINDEX:5,TROPONINI:5 in the last 168 hours BNP: BNP (last 3 results) No results found for this basename: PROBNP:3 in the last 8760 hours CBG: No results found for this basename: GLUCAP:5 in the last 168 hours     Signed:  HERNANDEZ ACOSTA,ESTELA  [facility] 09/29/2012, 9:57 AM   Agree with note by Toya Smothers, NP. Patient will be discharged to SNF today.  Peggye Pitt, MD

## 2012-09-28 NOTE — Progress Notes (Signed)
Physical Therapy Treatment Patient Details Name: [name] MRN: [id number] DOB: [birth year] Today's Date: 09/28/2012 Time: 8413-2440 PT Time Calculation (min): 30 min  PT Assessment / Plan / Recommendation Comments on Treatment Session  Pt's memory appears better however pt with high anxiety causing her to have limited ability to focus on task. Patient required minA for tolieting due to "I want to make sure I'm clean" and pt frustration with the wires getting in her way. Patient's anxiety and +SOB limited ambulation distance this date. Recommend short stay at SNF to achieve maximal functional recovery prior to transition back to home alone. Patient aware and desires to go to rehab.    Follow Up Recommendations  SNF;Supervision/Assistance - 24 hour     Does the patient have the potential to tolerate intense rehabilitation     Barriers to Discharge        Equipment Recommendations  None recommended by PT    Recommendations for Other Services    Frequency Min 3X/week   Plan Discharge plan needs to be updated;Frequency remains appropriate    Precautions / Restrictions Precautions Precautions: Fall Restrictions Weight Bearing Restrictions: No   Pertinent Vitals/Pain Pt denies pain    Mobility  Bed Mobility Bed Mobility: Supine to Sit;Sitting - Scoot to Edge of Bed Supine to Sit: 5: Supervision;With rails;HOB elevated Sitting - Scoot to Edge of Bed: 5: Supervision;With rail Details for Bed Mobility Assistance: increased time, + SOB SPO2 at 93% on 6 LO2 via [place] Transfers Transfers: Sit to Stand;Stand to Sit Sit to Stand: 4: Min guard;With upper extremity assist;From bed Stand to Sit: 4: Min guard;With upper extremity assist;To chair/3-in-1 Details for Transfer Assistance: v/c's for walker safety Ambulation/Gait Ambulation/Gait Assistance: 4: Min guard Ambulation Distance (Feet): 40 Feet Assistive device: Rolling walker Ambulation/Gait Assistance Details: min guard for  safety and walke rmanagement. pt very anxious, + SOB, SpO2 at 93% on 6 LO2 via [place]. HR in 90s Gait Pattern: Decreased stride length;Decreased step length - left;Decreased step length - right;Shuffle Gait velocity: Slow     Exercises     PT Diagnosis:    PT Problem List:   PT Treatment Interventions:     PT Goals Acute Rehab PT Goals PT Transfer Goal: Bed to Chair/Chair to Bed - Progress: Progressing toward goal PT Goal: Ambulate - Progress: Progressing toward goal  Visit Information  Last PT Received On: 09/28/12    Subjective Data  Subjective: Pt received in bed with request to use bathroom.   Cognition  Overall Cognitive Status: Appears within functional limits for tasks assessed/performed Arousal/Alertness: Awake/alert Orientation Level: Oriented X4 / Intact Behavior During Session: Anxious Memory Deficits: pt's memory deficts appear cleared up from yesterday    Balance     End of Session PT - End of Session Equipment Utilized During Treatment: Gait belt;Oxygen Activity Tolerance:  (limited by +SOB and anxiety) Patient left: in chair;with call bell/phone within reach Nurse Communication: Mobility status   GP     Marcene Brawn 09/28/2012, 10:48 AM  Lewis Shock, PT, DPT Pager #: [phone] Office #: [phone]

## 2012-09-28 NOTE — Progress Notes (Signed)
[facility] PROGRESS NOTE  [name] AVW:[id number] DOB: [birth year] DOA: 09/25/2012 PCP: Oliver Barre, MD  Assessment/Plan: Strep Pneumo PNA  -strep pneumo urinary antigen is positive.  -Continue azithro day #3 -Will transition to PO levaquin at time of DC to complete 10 days of treatment.  - continues with frequent cough. Afebrile, non-toxic appearing.   -Flu screen is negative.  -blood culture neg to date Hypoxia -likely related to #1. Repeat chest xray this am. Will give one dose lasix. Fine crackles auscultated on right.  -continue oxygen support and monitor sats UTI  -Rocephin given 09/25/13 -urine culture with multiple morphotypes none predominant Hypokalemia  -Repleted and continues to be low. Will check mag level.   - will replete for this am level and in anticipation of lasix she will be getting today as well   Leukocytosis  -2/2 PNA and UTI as well as steroids. Continue to taper steroids.Marland Kitchen??indication.  -Follow with antibiotics.  -afebrile non-toxic appearing   Code Status: presumed full will verify with family Family Communication:  Disposition Plan: SNF when able. Hopefully 24-36 hours   Consultants:none  Procedures:  none  Antibiotics:  Rocephin 09/25/12 >>>09/25/12  Azithromycin 09/26/12>>>>10/04/12  HPI/Subjective: Sitting up in bed eating breakfast. Somewhat teary. Denies pain/discomfort NAD  Objective: Filed Vitals:   09/27/12 2047 09/27/12 2108 09/28/12 0216 09/28/12 0528  BP:  123/51  127/64  Pulse:  76  93  Temp:  97.8 F (36.6 C)  98 F (36.7 C)  TempSrc:  Oral  Oral  Resp:      Height:      Weight:    67.586 kg (149 lb)  SpO2: 92% 90% 90% 90%    Intake/Output Summary (Last 24 hours) at 09/28/12 0802 Last data filed at 09/28/12 0700  Gross per 24 hour  Intake    243 ml  Output    451 ml  Net   -208 ml   Filed Weights   09/25/12 2314 09/28/12 0528  Weight: 63.4 kg (139 lb 12.4 oz) 67.586 kg (149 lb)     Exam:   General:  Awake, well nourished, oriented to self/place  Cardiovascular: RRR No MGR No LEE   Respiratory: mild increased work of breathing with exertion (repositioning in bed) fine crackles right base, diminished left base otherwise diffuse rhonchi. No wheeze  Abdomen: soft +BS non-tender to palpation  Data Reviewed: Basic Metabolic Panel:  Lab 09/28/12 8295 09/27/12 0505 09/26/12 0620 09/25/12 2126 09/25/12 1835  NA 142 139 138 -- 132*  K 3.2* 3.4* 3.0* -- 3.1*  CL 109 108 105 -- 95*  CO2 19 18* 20 -- 22  GLUCOSE 185* 202* 189* -- 125*  BUN 26* 23 17 -- 23  CREATININE 0.82 0.79 0.72 0.85 0.96  CALCIUM 8.0* 7.8* 8.1* -- 9.4  MG -- -- -- -- --  PHOS -- -- -- -- --   Liver Function Tests:  Lab 09/26/12 0620 09/25/12 1835  AST 28 30  ALT 21 25  ALKPHOS 68 75  BILITOT 0.4 0.4  PROT 6.0 7.4  ALBUMIN 2.6* 3.5   No results found for this basename: LIPASE:5,AMYLASE:5 in the last 168 hours No results found for this basename: AMMONIA:5 in the last 168 hours CBC:  Lab 09/28/12 0610 09/27/12 0505 09/26/12 0620 09/25/12 2126 09/25/12 1835  WBC 14.0* 13.2* 11.4* 9.4 10.6*  NEUTROABS -- -- 10.3* -- 8.6*  HGB 10.9* 10.7* 11.2* 10.4* 12.6  HCT 31.8* 29.9* 32.2* 30.8* 37.2  MCV 89.6 88.5 89.4  90.1 89.9  PLT 284 213 205 183 225   Cardiac Enzymes: No results found for this basename: CKTOTAL:5,CKMB:5,CKMBINDEX:5,TROPONINI:5 in the last 168 hours BNP (last 3 results) No results found for this basename: PROBNP:3 in the last 8760 hours CBG: No results found for this basename: GLUCAP:5 in the last 168 hours  Recent Results (from the past 240 hour(s))  CULTURE, BLOOD (ROUTINE X 2)     Status: Normal (Preliminary result)   Collection Time   09/25/12  6:35 PM      Component Value Range Status Comment   Specimen Description BLOOD ARM RIGHT   Final    Special Requests BOTTLES DRAWN AEROBIC AND ANAEROBIC 5CC   Final    Culture  Setup Time 09/26/2012 02:17   Final     Culture     Final    Value:        BLOOD CULTURE RECEIVED NO GROWTH TO DATE CULTURE WILL BE HELD FOR 5 DAYS BEFORE ISSUING A FINAL NEGATIVE REPORT   Report Status PENDING   Incomplete   CULTURE, BLOOD (ROUTINE X 2)     Status: Normal (Preliminary result)   Collection Time   09/25/12  6:55 PM      Component Value Range Status Comment   Specimen Description BLOOD ARM LEFT   Final    Special Requests BOTTLES DRAWN AEROBIC AND ANAEROBIC 10CC   Final    Culture  Setup Time 09/26/2012 02:17   Final    Culture     Final    Value:        BLOOD CULTURE RECEIVED NO GROWTH TO DATE CULTURE WILL BE HELD FOR 5 DAYS BEFORE ISSUING A FINAL NEGATIVE REPORT   Report Status PENDING   Incomplete   URINE CULTURE     Status: Normal   Collection Time   09/25/12  8:06 PM      Component Value Range Status Comment   Specimen Description URINE, CLEAN CATCH   Final    Special Requests NONE   Final    Culture  Setup Time 09/26/2012 02:53   Final    Colony Count 50,000 COLONIES/ML   Final    Culture     Final    Value: Multiple bacterial morphotypes present, none predominant. Suggest appropriate recollection if clinically indicated.   Report Status 09/27/2012 FINAL   Final      Studies: No results found.  Scheduled Meds:   . albuterol  2.5 mg Nebulization Q6H  . amLODipine  10 mg Oral Daily  . azithromycin  250 mg Intravenous Q24H  . calcium-vitamin D  2 tablet Oral Daily  . dipyridamole-aspirin  1 capsule Oral BID  . donepezil  10 mg Oral QHS  . folic acid-pyridoxine-cyancobalamin  1 tablet Oral Daily  . furosemide  40 mg Intravenous Once  . heparin  5,000 Units Subcutaneous Q8H  . ipratropium  0.5 mg Nebulization Q6H  . irbesartan  300 mg Oral Daily  . levothyroxine  125 mcg Oral QAC breakfast  . methylPREDNISolone sodium succinate  80 mg Intravenous Q12H  . oseltamivir  75 mg Oral BID  . sodium chloride  3 mL Intravenous Q12H   Continuous Infusions:   Principal Problem:  *PNA  (pneumonia) Active Problems:  UTI (lower urinary tract infection)  Hypokalemia  Leukocytosis    Time spent 30 minutes   [facility] M  [facility]  If 8PM-8AM, please contact night-coverage at [email or web address], password [facility] 09/28/2012, 8:02 AM  LOS: 3 days

## 2012-09-28 NOTE — Progress Notes (Signed)
Clinical Social Worker staffed case with MD and RN. Pt to dc tomorrow to [facility].  Jasmine December at [place] updated, Jasmine December to phone pt's son to review admission paperwork. MD updated.    Angelia Mould, MSW, [place] [phone]

## 2012-09-29 DIAGNOSIS — E039 Hypothyroidism, unspecified: Secondary | ICD-10-CM | POA: Diagnosis not present

## 2012-09-29 DIAGNOSIS — I69998 Other sequelae following unspecified cerebrovascular disease: Secondary | ICD-10-CM | POA: Diagnosis not present

## 2012-09-29 DIAGNOSIS — I442 Atrioventricular block, complete: Secondary | ICD-10-CM | POA: Diagnosis not present

## 2012-09-29 DIAGNOSIS — F0150 Vascular dementia without behavioral disturbance: Secondary | ICD-10-CM | POA: Diagnosis not present

## 2012-09-29 DIAGNOSIS — F0390 Unspecified dementia without behavioral disturbance: Secondary | ICD-10-CM | POA: Diagnosis not present

## 2012-09-29 DIAGNOSIS — E785 Hyperlipidemia, unspecified: Secondary | ICD-10-CM | POA: Diagnosis not present

## 2012-09-29 DIAGNOSIS — R279 Unspecified lack of coordination: Secondary | ICD-10-CM | POA: Diagnosis not present

## 2012-09-29 DIAGNOSIS — D7289 Other specified disorders of white blood cells: Secondary | ICD-10-CM | POA: Diagnosis not present

## 2012-09-29 DIAGNOSIS — I10 Essential (primary) hypertension: Secondary | ICD-10-CM | POA: Diagnosis not present

## 2012-09-29 DIAGNOSIS — M6281 Muscle weakness (generalized): Secondary | ICD-10-CM | POA: Diagnosis not present

## 2012-09-29 DIAGNOSIS — J189 Pneumonia, unspecified organism: Secondary | ICD-10-CM | POA: Diagnosis not present

## 2012-09-29 DIAGNOSIS — R269 Unspecified abnormalities of gait and mobility: Secondary | ICD-10-CM | POA: Diagnosis not present

## 2012-09-29 DIAGNOSIS — Z950 Presence of cardiac pacemaker: Secondary | ICD-10-CM | POA: Diagnosis not present

## 2012-09-29 LAB — BASIC METABOLIC PANEL
Calcium: 8.2 mg/dL — ABNORMAL LOW (ref 8.4–10.5)
Creatinine, Ser: 0.9 mg/dL (ref 0.50–1.10)
GFR calc Af Amer: 64 mL/min — ABNORMAL LOW (ref >90)
GFR calc non Af Amer: 55 mL/min — ABNORMAL LOW (ref >90)
Sodium: 137 mEq/L (ref 135–145)

## 2012-09-29 LAB — CBC
MCH: 30 pg (ref 26.0–34.0)
MCHC: 33.8 g/dL (ref 30.0–36.0)
MCV: 88.7 fL (ref 78.0–100.0)
Platelets: 280 10*3/uL (ref 150–400)
RBC: 3.73 MIL/uL — ABNORMAL LOW (ref 3.87–5.11)
RDW: 13.6 % (ref 11.5–15.5)

## 2012-09-29 MED ORDER — GUAIFENESIN-DM 100-10 MG/5ML PO SYRP: 5.0000 mL | ORAL_SOLUTION | ORAL | Status: DC | PRN

## 2012-09-29 MED ORDER — LEVOFLOXACIN 750 MG PO TABS: 750.0000 mg | ORAL_TABLET | ORAL | Status: DC

## 2012-09-29 MED ORDER — IRBESARTAN 300 MG PO TABS: 300.0000 mg | ORAL_TABLET | Freq: Every day | ORAL | Status: DC

## 2012-09-29 NOTE — Clinical Social Work Note (Signed)
CSW was consulted to complete discharge of patient. Pt to transfer to [facility] today via family around 1300. Facility and family are aware of d/c. D/C packet complete with chart copy, signed FL2.  CSW signing off as no other CSW needs identified at this time.  Lia Foyer, LCSWA [facility] Clinical Social Worker Contact #: [phone] (weekend)

## 2012-09-29 NOTE — Progress Notes (Signed)
Pt oxygen saturation staying in the lower 90's ( 92-94 %) without oxygen supplementation , kept patient without oxygen. Will continue to monitor.

## 2012-10-02 LAB — CULTURE, BLOOD (ROUTINE X 2)
Culture: NO GROWTH
Culture: NO GROWTH

## 2012-10-03 DIAGNOSIS — E039 Hypothyroidism, unspecified: Secondary | ICD-10-CM | POA: Diagnosis not present

## 2012-10-03 DIAGNOSIS — F0150 Vascular dementia without behavioral disturbance: Secondary | ICD-10-CM | POA: Diagnosis not present

## 2012-10-03 DIAGNOSIS — I10 Essential (primary) hypertension: Secondary | ICD-10-CM | POA: Diagnosis not present

## 2012-10-03 DIAGNOSIS — D7289 Other specified disorders of white blood cells: Secondary | ICD-10-CM | POA: Diagnosis not present

## 2012-10-12 ENCOUNTER — Ambulatory Visit (INDEPENDENT_AMBULATORY_CARE_PROVIDER_SITE_OTHER): Payer: Medicare Other | Admitting: Internal Medicine

## 2012-10-12 VITALS — BP 118/62 | HR 74 | Temp 98.4°F | Wt 139.0 lb

## 2012-10-12 DIAGNOSIS — I10 Essential (primary) hypertension: Secondary | ICD-10-CM

## 2012-10-12 DIAGNOSIS — E876 Hypokalemia: Secondary | ICD-10-CM | POA: Diagnosis not present

## 2012-10-12 DIAGNOSIS — J189 Pneumonia, unspecified organism: Secondary | ICD-10-CM

## 2012-10-12 DIAGNOSIS — N390 Urinary tract infection, site not specified: Secondary | ICD-10-CM

## 2012-10-12 MED ORDER — FA-PYRIDOXINE-CYANOCOBALAMIN 2.5-25-2 MG PO TABS: 1.0000 | ORAL_TABLET | Freq: Every day | ORAL | Status: DC

## 2012-10-12 MED ORDER — ATORVASTATIN CALCIUM 10 MG PO TABS: 10.0000 mg | ORAL_TABLET | Freq: Every day | ORAL | Status: DC

## 2012-10-12 MED ORDER — ESTRADIOL 0.1 MG/GM VA CREA: TOPICAL_CREAM | VAGINAL | Status: DC

## 2012-10-12 MED ORDER — LEVOTHYROXINE SODIUM 125 MCG PO TABS: 125.0000 ug | ORAL_TABLET | Freq: Every day | ORAL | Status: DC

## 2012-10-12 MED ORDER — AMLODIPINE BESYLATE 10 MG PO TABS: 10.0000 mg | ORAL_TABLET | Freq: Every day | ORAL | Status: DC

## 2012-10-12 MED ORDER — DONEPEZIL HCL 10 MG PO TABS: 10.0000 mg | ORAL_TABLET | Freq: Every day | ORAL | Status: DC

## 2012-10-12 MED ORDER — VALSARTAN 320 MG PO TABS: 320.0000 mg | ORAL_TABLET | Freq: Every day | ORAL | Status: DC

## 2012-10-12 MED ORDER — ASPIRIN-DIPYRIDAMOLE ER 25-200 MG PO CP12: 1.0000 | ORAL_CAPSULE | Freq: Two times a day (BID) | ORAL | Status: DC

## 2012-10-12 NOTE — Patient Instructions (Addendum)
OK to stop the cozaar 100  Mg OK to stop the HCT (fluid pill) OK to re-start the diovan as you have Please continue all other medications as before, and refills have been done if requested. Please have the pharmacy call with any other refills you may need. You are given the prescription order for the labs at home for next week Thank you for enrolling in MyChart. Please follow the instructions below to securely access your online medical record. MyChart allows you to send messages to your doctor, view your test results, renew your prescriptions, schedule appointments, and more. To Log into My Chart online, please go by [employer] or [employer] to [email or web address], or download the MyChart App from the [employer] of [employer] .  Your Username is:  [email or web address] (pass polly) Please send a practice Message on Mychart later today. Please return in April 15, or sooner if needed, with Lab testing done 3-5 days before

## 2012-10-13 ENCOUNTER — Encounter: Payer: Self-pay | Admitting: Internal Medicine

## 2012-10-13 NOTE — Assessment & Plan Note (Signed)
Clinically resolved, has few RLL with atelectasis on last cxr, hold on further tx,  to f/u any worsening symptoms or concerns

## 2012-10-13 NOTE — Assessment & Plan Note (Signed)
Ok to d/c [facility], may help with reduce tendency to low K, and wetting accidents, Please continue all other medications as before, and refills have been done if requested. BP Readings from Last 3 Encounters:  10/12/12 118/62  09/29/12 129/72  09/25/12 134/70

## 2012-10-13 NOTE — Assessment & Plan Note (Signed)
For f/u cx, but suspect resolved

## 2012-10-13 NOTE — Assessment & Plan Note (Addendum)
Possibly related to hct use, ok to d/c for now, may also help with reducing her wetting accidents, for f/u [facility] next wk with home health - gave rx to family to present to home health

## 2012-10-13 NOTE — Progress Notes (Signed)
Subjective:    Patient ID: [name], female    DOB: 1922-05-28, 79 y.o.   MRN: [id number]   HPI  Here to f/u with granddaughter who remains supportive;  Pt with recent hospn for pna/uti, improved with short stay after at rehab, now home since jan 15.  Did have home health BMET family brings today with K 3.4 on current meds.  BP has been running somewhat lower recent, and has cont'd having wetting accidents during the day mostly when she did not have these prior to recent hospn.  Last cxr with RLL atelectasis/pna.  Needs med refills.  Pt denies chest pain, increased sob or doe, wheezing, orthopnea, PND, increased LE swelling, palpitations, dizziness or syncope.  No cough, fever, ST  Denies urinary symptoms such as dysuria, frequency, urgency, flank pain, hematuria or n/v, fever, chills except for the above.   Past Medical History  Diagnosis Date  . Aphasia 01/09/2009  . 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 CEREBROVASCULAR ACCIDENT, HX OF 11/20/2007  . CVA 01/09/2009  . DEMENTIA 05/19/2008  . DIVERTICULOSIS, COLON 11/20/2007  . FATIGUE 11/20/2007  . GLUCOSE INTOLERANCE 12/06/2010  . HYPERLIPIDEMIA 11/20/2007  . HYPERTENSION 11/20/2007    Echo was done 07/07/11: mod LVH, EF 55-60%, grade 1 diast dysfxn, mild MR, mild LAE, mod TR, PASP 39  . HYPOTHYROIDISM 11/20/2007  . IRON DEFICIENCY 12/06/2010  . PACEMAKER, PERMANENT 01/09/2009  . TIA 05/14/2003    elevated CE's in setting of TIA and falls in 10/12 - echo normal with no further cardiac w/u pursued  . Urinary incontinence 01/09/2009  . Impaired glucose tolerance 06/03/2011  . TIA (transient ischemic attack) 10/10/2011   Past Surgical History  Procedure Date  . Medtronic cynthia dual chamber pacemaker serial number was [id number]...04/15/2009   . Abdominal hysterectomy   . S/p cerebral aneurysm 1991  . S/p pacemaker ddd   . Cholecystectomy   . S/p retinal attachment   . S/p thyroidectomy     reports that she has never smoked. She does not have  any smokeless tobacco history on file. She reports that she does not drink alcohol or use illicit drugs. family history includes Cancer in her mother. Allergies  Allergen Reactions  . Codeine     unknown   Current Outpatient Prescriptions on File Prior to Visit  Medication Sig Dispense Refill  . amLODipine (NORVASC) 10 MG tablet Take 1 tablet (10 mg total) by mouth daily.  90 tablet  3  . atorvastatin (LIPITOR) 10 MG tablet Take 1 tablet (10 mg total) by mouth daily.  90 tablet  3  . Calcium Carbonate-Vitamin D (CALTRATE 600+D) 600-400 MG-UNIT per tablet Take 1 tablet by mouth daily.        Marland Kitchen donepezil (ARICEPT) 10 MG tablet Take 1 tablet (10 mg total) by mouth daily.  90 tablet  3  . levothyroxine (SYNTHROID, LEVOTHROID) 125 MCG tablet Take 1 tablet (125 mcg total) by mouth daily.  90 tablet  3  . guaiFENesin-dextromethorphan (ROBITUSSIN DM) 100-10 MG/5ML syrup Take 5 mLs by mouth every 4 (four) hours as needed for cough.  118 mL     Review of Systems  Constitutional: Negative for unexpected weight change, or unusual diaphoresis  HENT: Negative for tinnitus.   Eyes: Negative for photophobia and visual disturbance.  Respiratory: Negative for choking and stridor.   Gastrointestinal: Negative for vomiting and blood in stool.  Genitourinary: Negative for hematuria and decreased urine volume.  Musculoskeletal: Negative for acute joint swelling Skin:  Negative for color change and wound.  Neurological: Negative for tremors and numbness other than noted  Psychiatric/Behavioral: Negative for decreased concentration or  hyperactivity.       Objective:   Physical Exam BP 118/62  Pulse 74  Temp 98.4 F (36.9 C) (Oral)  Wt 139 lb (63.05 kg)  SpO2 97% VS noted, not ill appaering, bright, able to get up on exam table with minimal assist Constitutional: Pt appears well-developed and well-nourished.  HENT: Head: NCAT.  Right Ear: External ear normal.  Left Ear: External ear normal.  Eyes:  Conjunctivae and EOM are normal. Pupils are equal, round, and reactive to light.  Neck: Normal range of motion. Neck supple.  Cardiovascular: Normal rate and regular rhythm.   Pulmonary/Chest: Effort normal and breath sounds normal except for persistent RLL crackles - ? Dry rales  Abd:  Soft, NT, non-distended, + BS, no flank tender Neurological: Pt is alert. Has baseline confusion Skin: Skin is warm. No erythema.  Psychiatric: Pt behavior is normal. .     Assessment & Plan:

## 2012-10-17 DIAGNOSIS — M6281 Muscle weakness (generalized): Secondary | ICD-10-CM | POA: Diagnosis not present

## 2012-10-17 DIAGNOSIS — R279 Unspecified lack of coordination: Secondary | ICD-10-CM | POA: Diagnosis not present

## 2012-10-17 DIAGNOSIS — Z9181 History of falling: Secondary | ICD-10-CM | POA: Diagnosis not present

## 2012-10-17 DIAGNOSIS — R269 Unspecified abnormalities of gait and mobility: Secondary | ICD-10-CM | POA: Diagnosis not present

## 2012-10-18 DIAGNOSIS — M6281 Muscle weakness (generalized): Secondary | ICD-10-CM | POA: Diagnosis not present

## 2012-10-18 DIAGNOSIS — R279 Unspecified lack of coordination: Secondary | ICD-10-CM | POA: Diagnosis not present

## 2012-10-18 DIAGNOSIS — Z9181 History of falling: Secondary | ICD-10-CM | POA: Diagnosis not present

## 2012-10-18 DIAGNOSIS — R269 Unspecified abnormalities of gait and mobility: Secondary | ICD-10-CM | POA: Diagnosis not present

## 2012-10-19 DIAGNOSIS — Z9181 History of falling: Secondary | ICD-10-CM | POA: Diagnosis not present

## 2012-10-19 DIAGNOSIS — R279 Unspecified lack of coordination: Secondary | ICD-10-CM | POA: Diagnosis not present

## 2012-10-19 DIAGNOSIS — R269 Unspecified abnormalities of gait and mobility: Secondary | ICD-10-CM | POA: Diagnosis not present

## 2012-10-19 DIAGNOSIS — M6281 Muscle weakness (generalized): Secondary | ICD-10-CM | POA: Diagnosis not present

## 2012-10-22 DIAGNOSIS — R269 Unspecified abnormalities of gait and mobility: Secondary | ICD-10-CM | POA: Diagnosis not present

## 2012-10-22 DIAGNOSIS — Z9181 History of falling: Secondary | ICD-10-CM | POA: Diagnosis not present

## 2012-10-22 DIAGNOSIS — R279 Unspecified lack of coordination: Secondary | ICD-10-CM | POA: Diagnosis not present

## 2012-10-22 DIAGNOSIS — M6281 Muscle weakness (generalized): Secondary | ICD-10-CM | POA: Diagnosis not present

## 2012-10-25 DIAGNOSIS — R279 Unspecified lack of coordination: Secondary | ICD-10-CM | POA: Diagnosis not present

## 2012-10-25 DIAGNOSIS — M6281 Muscle weakness (generalized): Secondary | ICD-10-CM | POA: Diagnosis not present

## 2012-10-25 DIAGNOSIS — R269 Unspecified abnormalities of gait and mobility: Secondary | ICD-10-CM | POA: Diagnosis not present

## 2012-10-25 DIAGNOSIS — Z9181 History of falling: Secondary | ICD-10-CM | POA: Diagnosis not present

## 2012-10-26 ENCOUNTER — Ambulatory Visit (INDEPENDENT_AMBULATORY_CARE_PROVIDER_SITE_OTHER): Payer: Medicare Other | Admitting: Internal Medicine

## 2012-10-26 ENCOUNTER — Other Ambulatory Visit (INDEPENDENT_AMBULATORY_CARE_PROVIDER_SITE_OTHER): Payer: Medicare Other

## 2012-10-26 ENCOUNTER — Encounter: Payer: Self-pay | Admitting: Internal Medicine

## 2012-10-26 ENCOUNTER — Ambulatory Visit (INDEPENDENT_AMBULATORY_CARE_PROVIDER_SITE_OTHER)
Admission: RE | Admit: 2012-10-26 | Discharge: 2012-10-26 | Disposition: A | Discharge status: Home / Self Care | Payer: Medicare Other | Source: Ambulatory Visit | Attending: Internal Medicine | Admitting: Internal Medicine

## 2012-10-26 VITALS — BP 114/62 | HR 82 | Temp 98.0°F | Ht 64.0 in | Wt 138.2 lb

## 2012-10-26 DIAGNOSIS — R55 Syncope and collapse: Secondary | ICD-10-CM

## 2012-10-26 DIAGNOSIS — M545 Low back pain: Secondary | ICD-10-CM

## 2012-10-26 DIAGNOSIS — S298XXA Other specified injuries of thorax, initial encounter: Secondary | ICD-10-CM | POA: Diagnosis not present

## 2012-10-26 DIAGNOSIS — R7302 Impaired glucose tolerance (oral): Secondary | ICD-10-CM

## 2012-10-26 DIAGNOSIS — S32009A Unspecified fracture of unspecified lumbar vertebra, initial encounter for closed fracture: Secondary | ICD-10-CM | POA: Diagnosis not present

## 2012-10-26 DIAGNOSIS — R7309 Other abnormal glucose: Secondary | ICD-10-CM

## 2012-10-26 DIAGNOSIS — I10 Essential (primary) hypertension: Secondary | ICD-10-CM | POA: Diagnosis not present

## 2012-10-26 DIAGNOSIS — N390 Urinary tract infection, site not specified: Secondary | ICD-10-CM

## 2012-10-26 LAB — BASIC METABOLIC PANEL
BUN: 22 mg/dL (ref 6–23)
CO2: 27 mEq/L (ref 19–32)
Chloride: 101 mEq/L (ref 96–112)
Creatinine, Ser: 1 mg/dL (ref 0.4–1.2)

## 2012-10-26 LAB — URINALYSIS, ROUTINE W REFLEX MICROSCOPIC
Specific Gravity, Urine: 1.015 (ref 1.000–1.030)
Urine Glucose: NEGATIVE
Urobilinogen, UA: 0.2 (ref 0.0–1.0)

## 2012-10-26 LAB — CBC WITH DIFFERENTIAL/PLATELET
Basophils Absolute: 0 10*3/uL (ref 0.0–0.1)
Eosinophils Absolute: 0.2 10*3/uL (ref 0.0–0.7)
Lymphocytes Relative: 30.5 % (ref 12.0–46.0)
MCHC: 33.8 g/dL (ref 30.0–36.0)
Neutrophils Relative %: 55.5 % (ref 43.0–77.0)
RDW: 14.1 % (ref 11.5–14.6)

## 2012-10-26 LAB — HEPATIC FUNCTION PANEL
Albumin: 3.5 g/dL (ref 3.5–5.2)
Alkaline Phosphatase: 67 U/L (ref 39–117)
Bilirubin, Direct: 0 mg/dL (ref 0.0–0.3)

## 2012-10-26 MED ORDER — LANCETS MISC: 1.0000 "application " | Freq: Every day | Status: DC

## 2012-10-26 MED ORDER — TRAMADOL HCL 50 MG PO TABS
50.0000 mg | ORAL_TABLET | Freq: Four times a day (QID) | ORAL | Status: DC | PRN
Start: 2012-10-26 — End: 2013-07-01

## 2012-10-26 MED ORDER — GLUCOSE BLOOD VI STRP: ORAL_STRIP | Status: DC

## 2012-10-26 MED ORDER — CEPHALEXIN 500 MG PO CAPS: 500.0000 mg | ORAL_CAPSULE | Freq: Four times a day (QID) | ORAL | Status: DC

## 2012-10-26 NOTE — Progress Notes (Signed)
Subjective:    Patient ID: [name], female    DOB: 01-Feb-1922, 79 y.o.   MRN: [id number]  HPI  Here after a fall x 6 days, told the family on Sunday, and pain has persisted, and family notes at the time she also mentioned chills, just not feeling well, but no obvious other flu or other infectious symptoms, or GU such as dysuria.  Family checked her on tues earlier this wk, pt seemed stiff but walked with walker, albiet slow, taking tylenol for pain but not working, declined med eval until this AM when she asked to be seen for back pain persistent.    Pt states no bowel or bladder change, fever, wt loss,  worsening LE pain/numbness/weakness, or further falls.  Denies urinary symptoms such as dysuria, urgency, flank pain, hematuria or n/v, fever, chills, but has had urinary freq in the past few days and just "doesnt fell well."  Pt with dementia but has been functioning ok on her own with frequent family checks.  Mid way through the visit pt states the reason she fell was an apparent unprovoked episode of syncope, which is new to the family with her today.  Denies prodromal symptoms, has hx of pacer, followed per Dr Ladona Ridgel with hx of complete heart block.  Only o/w able to say it was "only for a minute." with only pain after to the lower back and no CNS symptoms since that time, denies decreased po intake or orthostasis.  She is not able to describe what she was doing at the time or fell from what position such as sitting or standing.   Pt denies polydipsia, polyuria, does not normally check cbg's at home, sugar has been mildly increased in the past 6-12 months Past Medical History  Diagnosis Date  . Aphasia 01/09/2009  . 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 CEREBROVASCULAR ACCIDENT, HX OF 11/20/2007  . CVA 01/09/2009  . DEMENTIA 05/19/2008  . DIVERTICULOSIS, COLON 11/20/2007  . FATIGUE 11/20/2007  . GLUCOSE INTOLERANCE 12/06/2010  . HYPERLIPIDEMIA 11/20/2007  . HYPERTENSION 11/20/2007    Echo was done  07/07/11: mod LVH, EF 55-60%, grade 1 diast dysfxn, mild MR, mild LAE, mod TR, PASP 39  . HYPOTHYROIDISM 11/20/2007  . IRON DEFICIENCY 12/06/2010  . PACEMAKER, PERMANENT 01/09/2009  . TIA 05/14/2003    elevated CE's in setting of TIA and falls in 10/12 - echo normal with no further cardiac w/u pursued  . Urinary incontinence 01/09/2009  . Impaired glucose tolerance 06/03/2011  . TIA (transient ischemic attack) 10/10/2011   Past Surgical History  Procedure Date  . Medtronic cynthia dual chamber pacemaker serial number was [id number]...04/15/2009   . Abdominal hysterectomy   . S/p cerebral aneurysm 1991  . S/p pacemaker ddd   . Cholecystectomy   . S/p retinal attachment   . S/p thyroidectomy     reports that she has never smoked. She does not have any smokeless tobacco history on file. She reports that she does not drink alcohol or use illicit drugs. family history includes Cancer in her mother. Allergies  Allergen Reactions  . Codeine     unknown   Current Outpatient Prescriptions on File Prior to Visit  Medication Sig Dispense Refill  . amLODipine (NORVASC) 10 MG tablet Take 1 tablet (10 mg total) by mouth daily.  90 tablet  3  . atorvastatin (LIPITOR) 10 MG tablet Take 1 tablet (10 mg total) by mouth daily.  90 tablet  3  . Calcium Carbonate-Vitamin D (  CALTRATE 600+D) 600-400 MG-UNIT per tablet Take 1 tablet by mouth daily.        Marland Kitchen dipyridamole-aspirin (AGGRENOX) 200-25 MG per 12 hr capsule Take 1 capsule by mouth 2 (two) times daily.  180 capsule  3  . donepezil (ARICEPT) 10 MG tablet Take 1 tablet (10 mg total) by mouth daily.  90 tablet  3  . estradiol (ESTRACE) 0.1 MG/GM vaginal cream Apply two times per week  42.5 g  5  . folic acid-pyridoxine-cyancobalamin (FOLTX) 2.5-25-2 MG TABS Take 1 tablet by mouth daily.  90 each  3  . guaiFENesin-dextromethorphan (ROBITUSSIN DM) 100-10 MG/5ML syrup Take 5 mLs by mouth every 4 (four) hours as needed for cough.  118 mL    . levothyroxine  (SYNTHROID, LEVOTHROID) 125 MCG tablet Take 1 tablet (125 mcg total) by mouth daily.  90 tablet  3  . valsartan (DIOVAN) 320 MG tablet Take 1 tablet (320 mg total) by mouth daily.  90 tablet  3   Review of Systems  Constitutional: Negative for unexpected weight change, or unusual diaphoresis  HENT: Negative for tinnitus.   Eyes: Negative for photophobia and visual disturbance.  Respiratory: Negative for choking and stridor.   Gastrointestinal: Negative for vomiting and blood in stool.  Genitourinary: Negative for hematuria and decreased urine volume.  Musculoskeletal: Negative for acute joint swelling Skin: Negative for color change and wound.  Neurological: Negative for tremors and numbness Psychiatric/Behavioral: Negative for decreased concentration or  hyperactivity.      Objective:   Physical Exam BP 114/62  Pulse 82  Temp 98 F (36.7 C) (Oral)  Ht 5\' 4"  (1.626 m)  Wt 138 lb 4 oz (62.71 kg)  BMI 23.73 kg/m2  SpO2 97% VS noted, frail, walks with walker but able to ascend exam table with assist Constitutional: Pt appears thin side HENT: Head: NCAT.  Right Ear: External ear normal.  Left Ear: External ear normal.  Eyes: Conjunctivae and EOM are normal. Pupils are equal, round, and reactive to light.  Neck: Normal range of motion. Neck supple.  Cardiovascular: Normal rate and regular rhythm.   Pulmonary/Chest: Effort normal and breath sounds normal. - no rales or wheezing  Abd:  Soft, NT, non-distended, + BS, no flank tender, benign exam Neurological: Pt is alert. Has prob baseline confusion Skin: Skin is warm. No erythema.  Psychiatric: Pt behavior is normal.   Spine nontender except for mid low lumbar about l4, l5 without swelling, red, rash    Assessment & Plan:

## 2012-10-26 NOTE — Assessment & Plan Note (Addendum)
ECG reviewed as per emr, not clear if actually occurred as was not witnessed and pt with dementia, but will give benefit of doubt, to be referred to dr Ladona Ridgel for f/u, family agrees

## 2012-10-26 NOTE — Patient Instructions (Addendum)
Please check your blood sugar at least every other day (before a meal or before bedtime) You are given the glucometer and rx for supplies today Please take all new medication as prescribed - the antibiotic, and the pain medication Please have someone stay to observe you for 4 hours after you take the pain medication to make sure no dizziness or nausea Please go to the XRAY Department in the Basement (go straight as you get off the elevator) for the x-ray testing (for the lower back) Please go to the LAB in the Basement (turn left off the elevator) for the tests to be done today You will be contacted regarding the referral for: Dr Riley Lam

## 2012-10-27 ENCOUNTER — Encounter: Payer: Self-pay | Admitting: Internal Medicine

## 2012-10-27 ENCOUNTER — Other Ambulatory Visit: Payer: Self-pay | Admitting: Internal Medicine

## 2012-10-27 DIAGNOSIS — S32020A Wedge compression fracture of second lumbar vertebra, initial encounter for closed fracture: Secondary | ICD-10-CM

## 2012-10-27 NOTE — Assessment & Plan Note (Addendum)
With persistent pain, with low lumbar tender today and recent fall - for lumbar films today - r/o fx, consider NS referrral - ? kyphoplasty  Note:  Total time for pt hx, exam, review of record with pt in the room, determination of diagnoses and plan for further eval and tx is > 40 min, with over 50% spent in coordination and counseling of patient

## 2012-10-27 NOTE — Assessment & Plan Note (Signed)
With recent fall and back pain, BP controlled but ? Overcontrolled, pt denies current symptoms, cont meds as is for now

## 2012-10-27 NOTE — Assessment & Plan Note (Signed)
I suspect prob by hx and exam, for urine studies, empiric antibx

## 2012-10-27 NOTE — Assessment & Plan Note (Signed)
stable overall by history and exam, recent data reviewed with pt, and pt to continue medical treatment as before,  to f/u any worsening symptoms or concerns Lab Results  Component Value Date   HGBA1C 6.2* 09/26/2012   Doubt related to recent fall, but to check cbg's at home about every other day (family knows how), gave glucometer and rx for supplies, to call for > 150

## 2012-10-30 ENCOUNTER — Encounter: Payer: Self-pay | Admitting: Internal Medicine

## 2012-10-30 ENCOUNTER — Ambulatory Visit (INDEPENDENT_AMBULATORY_CARE_PROVIDER_SITE_OTHER): Payer: Medicare Other | Admitting: Internal Medicine

## 2012-10-30 VITALS — BP 127/78 | HR 70 | Ht 64.5 in | Wt 138.0 lb

## 2012-10-30 DIAGNOSIS — I4891 Unspecified atrial fibrillation: Secondary | ICD-10-CM

## 2012-10-30 DIAGNOSIS — I442 Atrioventricular block, complete: Secondary | ICD-10-CM

## 2012-10-30 DIAGNOSIS — Z950 Presence of cardiac pacemaker: Secondary | ICD-10-CM | POA: Diagnosis not present

## 2012-10-30 LAB — PACEMAKER DEVICE OBSERVATION
AL AMPLITUDE: 5.6 mv
AL IMPEDENCE PM: 637 Ohm
AL THRESHOLD: 0.5 V
RV LEAD IMPEDENCE PM: 534 Ohm
RV LEAD THRESHOLD: 0.75 V
VENTRICULAR PACING PM: 100

## 2012-10-30 NOTE — Assessment & Plan Note (Addendum)
The patient has had documented atrial fibrillation in the past. She has had multiple strokes. Her risk for an embolic stroke is high. I am not certain of her risk with one of a novel anticoagulants, specifically apixaban. I plan to discuss the use of this medication with her neurologist. I am concerned about recurrent stroke but also concerned about the potential risk for bleeding.

## 2012-10-30 NOTE — Assessment & Plan Note (Signed)
Her permanent pacemaker is working normally. There is no evidence that her recent fall was caused by a pacemaker malfunction or from a cardiac arrhythmia.

## 2012-10-30 NOTE — Progress Notes (Signed)
HPI [name] returns today for followup. She has a history of complete heart block, status post permanent pacemaker insertion. She is a history of cerebral aneurysm and underwent surgical clipping approximately 20 years ago. The procedure was complicated by large stroke. Since then she has had multiple recurrent strokes. She has had a history of atrial fibrillation documented on her pacemaker with pacemaker interrogations. Previously, she was not thought to be an anticoagulation candidate. The patient had a falling spell several weeks ago and she thinks that she had a mild stroke. She suffered an ecchymotic area on her face which has healed up nicely. No residual. She does have baseline difficulty with speech and gait and balance. She denies chest pain, shortness of breath, or peripheral edema. Allergies  Allergen Reactions  . Codeine     unknown     Current Outpatient Prescriptions  Medication Sig Dispense Refill  . amLODipine (NORVASC) 10 MG tablet Take 1 tablet (10 mg total) by mouth daily.  90 tablet  3  . atorvastatin (LIPITOR) 10 MG tablet Take 1 tablet (10 mg total) by mouth daily.  90 tablet  3  . Calcium Carbonate-Vitamin D (CALTRATE 600+D) 600-400 MG-UNIT per tablet Take 1 tablet by mouth daily.        . cephALEXin (KEFLEX) 500 MG capsule Take 1 capsule (500 mg total) by mouth 4 (four) times daily.  40 capsule  0  . dipyridamole-aspirin (AGGRENOX) 200-25 MG per 12 hr capsule Take 1 capsule by mouth 2 (two) times daily.  180 capsule  3  . donepezil (ARICEPT) 10 MG tablet Take 1 tablet (10 mg total) by mouth daily.  90 tablet  3  . estradiol (ESTRACE) 0.1 MG/GM vaginal cream Apply two times per week  42.5 g  5  . folic acid-pyridoxine-cyancobalamin (FOLTX) 2.5-25-2 MG TABS Take 1 tablet by mouth daily.  90 each  3  . glucose blood (FREESTYLE LITE) test strip Use as instructed  100 each  12  . guaiFENesin-dextromethorphan (ROBITUSSIN DM) 100-10 MG/5ML syrup Take 5 mLs by mouth every 4  (four) hours as needed for cough.  118 mL    . Lancets MISC 1 application by Does not apply route daily.  10 each  11  . levothyroxine (SYNTHROID, LEVOTHROID) 125 MCG tablet Take 1 tablet (125 mcg total) by mouth daily.  90 tablet  3  . traMADol (ULTRAM) 50 MG tablet Take 1 tablet (50 mg total) by mouth every 6 (six) hours as needed for pain.  60 tablet  1  . valsartan (DIOVAN) 320 MG tablet Take 1 tablet (320 mg total) by mouth daily.  90 tablet  3     Past Medical History  Diagnosis Date  . Aphasia 01/09/2009  . 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 CEREBROVASCULAR ACCIDENT, HX OF 11/20/2007  . CVA 01/09/2009  . DEMENTIA 05/19/2008  . DIVERTICULOSIS, COLON 11/20/2007  . FATIGUE 11/20/2007  . GLUCOSE INTOLERANCE 12/06/2010  . HYPERLIPIDEMIA 11/20/2007  . HYPERTENSION 11/20/2007    Echo was done 07/07/11: mod LVH, EF 55-60%, grade 1 diast dysfxn, mild MR, mild LAE, mod TR, PASP 39  . HYPOTHYROIDISM 11/20/2007  . IRON DEFICIENCY 12/06/2010  . PACEMAKER, PERMANENT 01/09/2009  . TIA 05/14/2003    elevated CE's in setting of TIA and falls in 10/12 - echo normal with no further cardiac w/u pursued  . Urinary incontinence 01/09/2009  . Impaired glucose tolerance 06/03/2011  . TIA (transient ischemic attack) 10/10/2011    ROS:   All  systems reviewed and negative except as noted in the HPI.   Past Surgical History  Procedure Date  . Medtronic cynthia dual chamber pacemaker serial number was [id number]...04/15/2009   . Abdominal hysterectomy   . S/p cerebral aneurysm 1991  . S/p pacemaker ddd   . Cholecystectomy   . S/p retinal attachment   . S/p thyroidectomy      Family History  Problem Relation Age of Onset  . Cancer Mother     breast cancer     History   Social History  . Marital Status: Divorced    Spouse Name: N/A    Number of Children: N/A  . Years of Education: N/A   Occupational History  . retired Catering manager, lives at [employer]    Social History Main Topics  . Smoking  status: Never Smoker   . Smokeless tobacco: Not on file  . Alcohol Use: No  . Drug Use: No  . Sexually Active: Not on file   Other Topics Concern  . Not on file   Social History Narrative  . No narrative on file     BP 127/78  Pulse 70  Ht 5' 4.5" (1.638 m)  Wt 138 lb (62.596 kg)  BMI 23.32 kg/m2  Physical Exam:  Elderly appearing, NAD HEENT: Unremarkable Neck:  No JVD, no thyromegally Lungs:  Clear with no wheezes, rales, or rhonchi. HEART:  Regular rate rhythm, no murmurs, no rubs, no clicks Abd:  soft, positive bowel sounds, no organomegally, no rebound, no guarding Ext:  2 plus pulses, no edema, no cyanosis, no clubbing Skin:  No rashes no nodules Neuro:  CN II through XII intact, motor grossly intact, some difficulty with speech   DEVICE  Normal device function.  See PaceArt for details. She has had no ventricular arrhythmias. Her underlying rhythm is complete heart block with a ventricular escape of 40 beats per minute  Assess/Plan:

## 2012-10-30 NOTE — Patient Instructions (Signed)
Dr Ladona Ridgel is going to talk with Dr Pearlean Brownie about your case and we will call you back  Dr Ladona Ridgel instructed patient and granddaughter to call us if she has not heard back in one week from [place]

## 2012-10-31 ENCOUNTER — Encounter: Payer: Self-pay | Admitting: Internal Medicine

## 2012-11-01 DIAGNOSIS — S129XXA Fracture of neck, unspecified, initial encounter: Secondary | ICD-10-CM | POA: Diagnosis not present

## 2012-11-02 DIAGNOSIS — R32 Unspecified urinary incontinence: Secondary | ICD-10-CM | POA: Diagnosis not present

## 2012-11-02 DIAGNOSIS — N8189 Other female genital prolapse: Secondary | ICD-10-CM | POA: Diagnosis not present

## 2012-11-02 DIAGNOSIS — R269 Unspecified abnormalities of gait and mobility: Secondary | ICD-10-CM | POA: Diagnosis not present

## 2012-11-02 DIAGNOSIS — R279 Unspecified lack of coordination: Secondary | ICD-10-CM | POA: Diagnosis not present

## 2012-11-02 DIAGNOSIS — M6281 Muscle weakness (generalized): Secondary | ICD-10-CM | POA: Diagnosis not present

## 2012-11-02 DIAGNOSIS — R262 Difficulty in walking, not elsewhere classified: Secondary | ICD-10-CM | POA: Diagnosis not present

## 2012-11-05 DIAGNOSIS — N8111 Cystocele, midline: Secondary | ICD-10-CM | POA: Diagnosis not present

## 2012-11-05 DIAGNOSIS — N816 Rectocele: Secondary | ICD-10-CM | POA: Diagnosis not present

## 2012-11-05 DIAGNOSIS — N8189 Other female genital prolapse: Secondary | ICD-10-CM | POA: Diagnosis not present

## 2012-11-06 DIAGNOSIS — R269 Unspecified abnormalities of gait and mobility: Secondary | ICD-10-CM | POA: Diagnosis not present

## 2012-11-06 DIAGNOSIS — R262 Difficulty in walking, not elsewhere classified: Secondary | ICD-10-CM | POA: Diagnosis not present

## 2012-11-06 DIAGNOSIS — R279 Unspecified lack of coordination: Secondary | ICD-10-CM | POA: Diagnosis not present

## 2012-11-06 DIAGNOSIS — R32 Unspecified urinary incontinence: Secondary | ICD-10-CM | POA: Diagnosis not present

## 2012-11-06 DIAGNOSIS — M6281 Muscle weakness (generalized): Secondary | ICD-10-CM | POA: Diagnosis not present

## 2012-11-07 DIAGNOSIS — M6281 Muscle weakness (generalized): Secondary | ICD-10-CM | POA: Diagnosis not present

## 2012-11-07 DIAGNOSIS — N8111 Cystocele, midline: Secondary | ICD-10-CM | POA: Diagnosis not present

## 2012-11-07 DIAGNOSIS — R32 Unspecified urinary incontinence: Secondary | ICD-10-CM | POA: Diagnosis not present

## 2012-11-07 DIAGNOSIS — N8189 Other female genital prolapse: Secondary | ICD-10-CM | POA: Diagnosis not present

## 2012-11-07 DIAGNOSIS — R262 Difficulty in walking, not elsewhere classified: Secondary | ICD-10-CM | POA: Diagnosis not present

## 2012-11-07 DIAGNOSIS — N816 Rectocele: Secondary | ICD-10-CM | POA: Diagnosis not present

## 2012-11-07 DIAGNOSIS — R279 Unspecified lack of coordination: Secondary | ICD-10-CM | POA: Diagnosis not present

## 2012-11-07 DIAGNOSIS — R269 Unspecified abnormalities of gait and mobility: Secondary | ICD-10-CM | POA: Diagnosis not present

## 2012-11-10 ENCOUNTER — Other Ambulatory Visit: Payer: Self-pay

## 2012-11-12 DIAGNOSIS — R32 Unspecified urinary incontinence: Secondary | ICD-10-CM | POA: Diagnosis not present

## 2012-11-12 DIAGNOSIS — M6281 Muscle weakness (generalized): Secondary | ICD-10-CM | POA: Diagnosis not present

## 2012-11-12 DIAGNOSIS — R269 Unspecified abnormalities of gait and mobility: Secondary | ICD-10-CM | POA: Diagnosis not present

## 2012-11-12 DIAGNOSIS — R279 Unspecified lack of coordination: Secondary | ICD-10-CM | POA: Diagnosis not present

## 2012-11-12 DIAGNOSIS — R262 Difficulty in walking, not elsewhere classified: Secondary | ICD-10-CM | POA: Diagnosis not present

## 2012-11-13 DIAGNOSIS — R279 Unspecified lack of coordination: Secondary | ICD-10-CM | POA: Diagnosis not present

## 2012-11-13 DIAGNOSIS — R269 Unspecified abnormalities of gait and mobility: Secondary | ICD-10-CM | POA: Diagnosis not present

## 2012-11-13 DIAGNOSIS — M6281 Muscle weakness (generalized): Secondary | ICD-10-CM | POA: Diagnosis not present

## 2012-11-13 DIAGNOSIS — N8111 Cystocele, midline: Secondary | ICD-10-CM | POA: Diagnosis not present

## 2012-11-13 DIAGNOSIS — R262 Difficulty in walking, not elsewhere classified: Secondary | ICD-10-CM | POA: Diagnosis not present

## 2012-11-13 DIAGNOSIS — R32 Unspecified urinary incontinence: Secondary | ICD-10-CM | POA: Diagnosis not present

## 2012-11-13 DIAGNOSIS — N816 Rectocele: Secondary | ICD-10-CM | POA: Diagnosis not present

## 2012-11-13 DIAGNOSIS — N8189 Other female genital prolapse: Secondary | ICD-10-CM | POA: Diagnosis not present

## 2012-11-20 ENCOUNTER — Encounter: Payer: Self-pay | Admitting: Internal Medicine

## 2012-11-20 ENCOUNTER — Other Ambulatory Visit: Payer: Self-pay | Admitting: Internal Medicine

## 2012-11-20 MED ORDER — TELMISARTAN 80 MG PO TABS: 80.0000 mg | ORAL_TABLET | Freq: Every day | ORAL | Status: DC

## 2012-11-21 ENCOUNTER — Encounter (HOSPITAL_COMMUNITY): Payer: Self-pay

## 2012-11-21 ENCOUNTER — Other Ambulatory Visit: Payer: Self-pay | Admitting: Obstetrics and Gynecology

## 2012-11-22 ENCOUNTER — Encounter (HOSPITAL_COMMUNITY): Payer: Self-pay

## 2012-11-22 ENCOUNTER — Encounter (HOSPITAL_COMMUNITY)
Admission: RE | Admit: 2012-11-22 | Discharge: 2012-11-22 | Disposition: A | Discharge status: Home / Self Care | Payer: Medicare Other | Source: Ambulatory Visit | Attending: Obstetrics and Gynecology | Admitting: Obstetrics and Gynecology

## 2012-11-22 HISTORY — DX: Presence of cardiac pacemaker: Z95.0

## 2012-11-22 HISTORY — DX: Unspecified dementia, unspecified severity, without behavioral disturbance, psychotic disturbance, mood disturbance, and anxiety: F03.90

## 2012-11-22 HISTORY — DX: Unspecified osteoarthritis, unspecified site: M19.90

## 2012-11-22 HISTORY — DX: Heart failure, unspecified: I50.9

## 2012-11-22 HISTORY — DX: Atherosclerotic heart disease of native coronary artery without angina pectoris: I25.10

## 2012-11-22 LAB — COMPREHENSIVE METABOLIC PANEL
AST: 22 U/L (ref 0–37)
BUN: 24 mg/dL — ABNORMAL HIGH (ref 6–23)
CO2: 25 mEq/L (ref 19–32)
Calcium: 9 mg/dL (ref 8.4–10.5)
Chloride: 99 mEq/L (ref 96–112)
Creatinine, Ser: 1.18 mg/dL — ABNORMAL HIGH (ref 0.50–1.10)
GFR calc Af Amer: 46 mL/min — ABNORMAL LOW (ref >90)
GFR calc non Af Amer: 40 mL/min — ABNORMAL LOW (ref >90)
Total Bilirubin: 0.4 mg/dL (ref 0.3–1.2)

## 2012-11-22 LAB — CBC
HCT: 37.7 % (ref 36.0–46.0)
MCH: 30.7 pg (ref 26.0–34.0)
MCV: 91.7 fL (ref 78.0–100.0)
Platelets: 268 10*3/uL (ref 150–400)
RBC: 4.11 MIL/uL (ref 3.87–5.11)

## 2012-11-22 NOTE — Patient Instructions (Signed)
Your procedure is scheduled on:11/26/12  Enter through the Main Entrance at :0830 am Pick up desk phone and dial [phone] and inform us of your arrival.  Please call [phone] if you have any problems the morning of surgery.  Remember: Do not eat or drink after midnight:Sunday   Take these meds the morning of surgery with a sip of water: morning meds as usual- hold vitamins, etc   DO NOT wear jewelry, eye make-up, lipstick,body lotion, or dark fingernail polish. Do not shave for 48 hours prior to surgery.  If you are to be admitted after surgery, leave suitcase in car until your room has been assigned. Patients discharged on the day of surgery will not be allowed to drive home.

## 2012-11-25 NOTE — H&P (Signed)
NAMEARDETH, REPETTO            ACCOUNT NO.:  [id number]  MEDICAL RECORD NO.:  [id number]  LOCATION:  [facility]                         FACILITY:  [facility]  PHYSICIAN:  Lenoard Aden, M.D.DATE OF BIRTH:  Feb 01, 1922  DATE OF ADMISSION:  11/21/2012 DATE OF DISCHARGE:                             HISTORY & PHYSICAL   SURGERY ON:  11/26/2012  CHIEF COMPLAINT:  Refractory symptomatic vaginal vault prolapse.  HISTORY OF PRESENT ILLNESS:  She is an 79 year old white female, G2, P2, with a history of hysterectomy performed for unknown reasons previously, also with a history of an anterior colporrhaphy with cystocele repair and suprapubic cystotomy in 1997, who presents now with symptomatic vault prolapse for definitive therapy.  She has had multiple trials of multiple pessaries with failures noted to be recurrent over the past 7 years.  ALLERGIES:  She has allergies to codeine.  MEDICATIONS:  Her medications include Estrace cream vaginally, Synthroid, Norvasc, multivitamin, Lipitor, Diovan, calcium, Aricept, tramadol, Aggrenox, and MiraLAX.  PAST MEDICAL HISTORY:  Her medical problems to include symptomatic pelvic relaxation, thyroid disease, history of unexpected aphasia, history of cerebral hemorrhage with aneurysm clipping in 1993, family history of breast cancer, heart disease, and myocardial infarction. History of 2 previous vaginal deliveries.  PAST SURGICAL HISTORY:  Surgical history is remarkable for as noted hysterectomy, colporrhaphy, hemorrhoidectomy, cholecystectomy, detached retina with repair, and removal of thyroid nodule.  PHYSICAL EXAMINATION:  GENERAL:  She is a well-developed, well-nourished white female.  Height is 64 inches, weight of 134 pounds.  She is alert and oriented to person, place, and time. HEENT:  Normal. NECK:  Supple.  Full range of motion. LUNGS:  Clear. HEART:  Regular rhythm. ABDOMEN:  Soft, nontender. PELVIC:  Confirmed vault prolapse with no  pelvic masses appreciated. EXTREMITIES:  __________. NEUROLOGIC:  Nonfocal. SKIN:  Intact.  IMPRESSION:  Symptomatic refractory post hysterectomy vaginal vault prolapse.  PLAN:  We will __________ with herniorrhaphy, levatorplasty for posterior vault support.  The acknowledges with no desire to be sexually active in the future.  Risks of anesthesia, infection, bleeding, injury to surrounding organs specifically bowel and bladder with possible need for repair noted.  The patient acknowledges and wishes to proceed. Medical clearance has been done __________.     Lenoard Aden, M.D.     RJT/MEDQ  D:  11/25/2012  T:  11/25/2012  Job:  [id number]

## 2012-11-26 ENCOUNTER — Ambulatory Visit (HOSPITAL_COMMUNITY): Payer: Medicare Other | Admitting: Anesthesiology

## 2012-11-26 ENCOUNTER — Encounter (HOSPITAL_COMMUNITY): Payer: Self-pay | Admitting: Anesthesiology

## 2012-11-26 ENCOUNTER — Encounter (HOSPITAL_COMMUNITY)
Admission: RE | Disposition: A | Discharge status: Home / Self Care | Payer: Self-pay | Source: Ambulatory Visit | Attending: Obstetrics and Gynecology

## 2012-11-26 ENCOUNTER — Ambulatory Visit (HOSPITAL_COMMUNITY)
Admission: RE | Admit: 2012-11-26 | Discharge: 2012-11-27 | Disposition: A | Discharge status: Home / Self Care | Payer: Medicare Other | Source: Ambulatory Visit | Attending: Obstetrics and Gynecology | Admitting: Obstetrics and Gynecology

## 2012-11-26 ENCOUNTER — Encounter (HOSPITAL_COMMUNITY): Payer: Self-pay | Admitting: *Deleted

## 2012-11-26 DIAGNOSIS — N993 Prolapse of vaginal vault after hysterectomy: Secondary | ICD-10-CM | POA: Insufficient documentation

## 2012-11-26 DIAGNOSIS — L918 Other hypertrophic disorders of the skin: Secondary | ICD-10-CM | POA: Insufficient documentation

## 2012-11-26 DIAGNOSIS — N8110 Cystocele, unspecified: Secondary | ICD-10-CM | POA: Diagnosis not present

## 2012-11-26 DIAGNOSIS — N72 Inflammatory disease of cervix uteri: Secondary | ICD-10-CM | POA: Insufficient documentation

## 2012-11-26 DIAGNOSIS — N8189 Other female genital prolapse: Secondary | ICD-10-CM | POA: Diagnosis not present

## 2012-11-26 HISTORY — PX: CYSTOCELE REPAIR: SHX163

## 2012-11-26 LAB — BASIC METABOLIC PANEL
BUN: 20 mg/dL (ref 6–23)
Creatinine, Ser: 0.99 mg/dL (ref 0.50–1.10)
GFR calc Af Amer: 57 mL/min — ABNORMAL LOW (ref >90)
GFR calc non Af Amer: 49 mL/min — ABNORMAL LOW (ref >90)

## 2012-11-26 SURGERY — COLPORRHAPHY, ANTERIOR, FOR CYSTOCELE REPAIR
Anesthesia: Spinal | Site: Vagina | Wound class: Clean Contaminated

## 2012-11-26 MED ORDER — DONEPEZIL HCL 10 MG PO TABS
10.0000 mg | ORAL_TABLET | Freq: Every day | ORAL | Status: DC
  Administered 2012-11-27: 10 mg via ORAL
  Filled 2012-11-26: qty 1

## 2012-11-26 MED ORDER — TRAMADOL HCL 50 MG PO TABS: 50.0000 mg | ORAL_TABLET | Freq: Four times a day (QID) | ORAL | Status: DC | PRN

## 2012-11-26 MED ORDER — VASOPRESSIN 20 UNIT/ML IJ SOLN
INTRAMUSCULAR | Status: AC
  Filled 2012-11-26: qty 1

## 2012-11-26 MED ORDER — LIDOCAINE-EPINEPHRINE 1 %-1:100000 IJ SOLN
INTRAMUSCULAR | Status: DC | PRN
  Administered 2012-11-26: 30 mL

## 2012-11-26 MED ORDER — ATORVASTATIN CALCIUM 10 MG PO TABS
10.0000 mg | ORAL_TABLET | Freq: Every day | ORAL | Status: DC
  Administered 2012-11-27: 10 mg via ORAL
  Filled 2012-11-26: qty 1

## 2012-11-26 MED ORDER — ESTRADIOL 0.1 MG/GM VA CREA
TOPICAL_CREAM | VAGINAL | Status: AC
  Filled 2012-11-26: qty 42.5

## 2012-11-26 MED ORDER — GLYCOPYRROLATE 0.2 MG/ML IJ SOLN
INTRAMUSCULAR | Status: DC | PRN
  Administered 2012-11-26: 0.1 mg via INTRAVENOUS

## 2012-11-26 MED ORDER — SODIUM CHLORIDE 0.9 % IR SOLN
Status: DC | PRN
  Administered 2012-11-26: 1000 mL

## 2012-11-26 MED ORDER — ADULT MULTIVITAMIN W/MINERALS CH
1.0000 | ORAL_TABLET | Freq: Every day | ORAL | Status: DC
  Administered 2012-11-26 – 2012-11-27 (×2): 1 via ORAL
  Filled 2012-11-26 (×2): qty 1

## 2012-11-26 MED ORDER — AMLODIPINE BESYLATE 10 MG PO TABS
10.0000 mg | ORAL_TABLET | Freq: Every day | ORAL | Status: DC
  Administered 2012-11-27: 10 mg via ORAL
  Filled 2012-11-26: qty 1

## 2012-11-26 MED ORDER — EPHEDRINE SULFATE 50 MG/ML IJ SOLN
INTRAMUSCULAR | Status: DC | PRN
  Administered 2012-11-26: 5 mg via INTRAVENOUS

## 2012-11-26 MED ORDER — FA-PYRIDOXINE-CYANOCOBALAMIN 2.5-25-2 MG PO TABS
1.0000 | ORAL_TABLET | Freq: Every day | ORAL | Status: DC
  Administered 2012-11-26 – 2012-11-27 (×2): 1 via ORAL
  Filled 2012-11-26 (×3): qty 1

## 2012-11-26 MED ORDER — KCL-LACTATED RINGERS 20 MEQ/L IV SOLN
INTRAVENOUS | Status: DC
  Administered 2012-11-26 – 2012-11-27 (×2): via INTRAVENOUS
  Filled 2012-11-26 (×2): qty 1000

## 2012-11-26 MED ORDER — KCL-LACTATED RINGERS 20 MEQ/L IV SOLN: INTRAVENOUS | Status: DC

## 2012-11-26 MED ORDER — BUPIVACAINE IN DEXTROSE 0.75-8.25 % IT SOLN
INTRATHECAL | Status: DC | PRN
  Administered 2012-11-26: 1.4 mL via INTRATHECAL

## 2012-11-26 MED ORDER — KCL-LACTATED RINGERS 20 MEQ/L IV SOLN
INTRAVENOUS | Status: DC
  Filled 2012-11-26 (×2): qty 1000

## 2012-11-26 MED ORDER — CEFAZOLIN SODIUM-DEXTROSE 2-3 GM-% IV SOLR
2.0000 g | INTRAVENOUS | Status: AC
  Administered 2012-11-26: 2 g via INTRAVENOUS

## 2012-11-26 MED ORDER — POTASSIUM CHLORIDE CRYS ER 20 MEQ PO TBCR
20.0000 meq | EXTENDED_RELEASE_TABLET | Freq: Once | ORAL | Status: AC
  Administered 2012-11-26: 20 meq via ORAL
  Filled 2012-11-26: qty 1

## 2012-11-26 MED ORDER — LEVOTHYROXINE SODIUM 125 MCG PO TABS
125.0000 ug | ORAL_TABLET | Freq: Every day | ORAL | Status: DC
  Administered 2012-11-27: 125 ug via ORAL
  Filled 2012-11-26: qty 1

## 2012-11-26 MED ORDER — LACTATED RINGERS IV SOLN: INTRAVENOUS | Status: DC

## 2012-11-26 MED ORDER — FENTANYL CITRATE 0.05 MG/ML IJ SOLN: 25.0000 ug | INTRAMUSCULAR | Status: DC | PRN

## 2012-11-26 MED ORDER — ZOLPIDEM TARTRATE 5 MG PO TABS: 5.0000 mg | ORAL_TABLET | Freq: Every evening | ORAL | Status: DC | PRN

## 2012-11-26 MED ORDER — LACTATED RINGERS IV SOLN
INTRAVENOUS | Status: DC
  Administered 2012-11-26: 10:00:00 via INTRAVENOUS

## 2012-11-26 MED ORDER — ASPIRIN-DIPYRIDAMOLE ER 25-200 MG PO CP12
1.0000 | ORAL_CAPSULE | Freq: Two times a day (BID) | ORAL | Status: DC
  Administered 2012-11-26 – 2012-11-27 (×2): 1 via ORAL
  Filled 2012-11-26 (×3): qty 1

## 2012-11-26 SURGICAL SUPPLY — 17 items
CLOTH BEACON ORANGE TIMEOUT ST (SAFETY) ×2 IMPLANT
CONT PATH 16OZ SNAP LID 3702 (MISCELLANEOUS) IMPLANT
DECANTER SPIKE VIAL GLASS SM (MISCELLANEOUS) IMPLANT
ELECT NEEDLE TIP 2.8 STRL (NEEDLE) IMPLANT
GLOVE BIO SURGEON STRL SZ7.5 (GLOVE) ×4 IMPLANT
GOWN PREVENTION PLUS LG XLONG (DISPOSABLE) ×6 IMPLANT
GOWN PREVENTION PLUS XLARGE (GOWN DISPOSABLE) ×2 IMPLANT
NEEDLE HYPO 22GX1.5 SAFETY (NEEDLE) IMPLANT
NS IRRIG 1000ML POUR BTL (IV SOLUTION) ×2 IMPLANT
PACK VAGINAL WOMENS (CUSTOM PROCEDURE TRAY) ×2 IMPLANT
SUT VIC AB 0 CT1 36 (SUTURE) ×6 IMPLANT
SUT VIC AB 2-0 CT2 27 (SUTURE) ×8 IMPLANT
SUT VIC AB 3-0 CT1 27 (SUTURE) ×3
SUT VIC AB 3-0 CT1 TAPERPNT 27 (SUTURE) ×3 IMPLANT
TOWEL OR 17X24 6PK STRL BLUE (TOWEL DISPOSABLE) ×4 IMPLANT
TRAY FOLEY CATH 14FR (SET/KITS/TRAYS/PACK) ×2 IMPLANT
WATER STERILE IRR 1000ML POUR (IV SOLUTION) ×2 IMPLANT

## 2012-11-26 NOTE — H&P (Signed)
[name], [name]            ACCOUNT NO.:  [id number]  MEDICAL RECORD NO.:  [id number]  LOCATION:  [facility]                         FACILITY:  [facility]  PHYSICIAN:  Tommi Emery.D.DATE OF BIRTH:  May 19, 1922  DATE OF ADMISSION:  11/21/2012 DATE OF DISCHARGE:                             HISTORY & PHYSICAL   SURGERY ON:  11/26/2012  CHIEF COMPLAINT:  Refractory symptomatic vaginal vault prolapse.  HISTORY OF PRESENT ILLNESS:  She is an 79 year old white female, G2, P2, with a history of hysterectomy performed for unknown reasons previously, also with a history of an anterior colporrhaphy with cystocele repair and suprapubic cystotomy in 1997, who presents now with symptomatic vault prolapse for definitive therapy.  She has had multiple trials of multiple pessaries with failures noted to be recurrent over the past 7 years.  ALLERGIES:  She has allergies to codeine.  MEDICATIONS:  Her medications include Estrace cream vaginally, Synthroid, Norvasc, multivitamin, Lipitor, Diovan, calcium, Aricept, tramadol, Aggrenox, and MiraLAX.  PAST MEDICAL HISTORY:  Her medical problems to include symptomatic pelvic relaxation, thyroid disease, history of unexpected aphasia, history of cerebral hemorrhage with aneurysm clipping in 1993, family history of breast cancer, heart disease, and myocardial infarction. History of 2 previous vaginal deliveries.  PAST SURGICAL HISTORY:  Surgical history is remarkable for as noted hysterectomy, colporrhaphy, hemorrhoidectomy, cholecystectomy, detached retina with repair, and removal of thyroid nodule.  PHYSICAL EXAMINATION:  GENERAL:  She is a well-developed, well-nourished white female.  Height is 64 inches, weight of 134 pounds.  She is alert and oriented to person, place, and time. HEENT:  Normal. NECK:  Supple.  Full range of motion. LUNGS:  Clear. HEART:  Regular rhythm. ABDOMEN:  Soft, nontender. PELVIC:  Confirmed vault prolapse with no  pelvic masses appreciated. EXTREMITIES:  No cords, minimal edema. NEUROLOGIC:  Nonfocal. SKIN:  Intact.  IMPRESSION:  Symptomatic refractory post hysterectomy vaginal vault prolapse.  PLAN:  We will proceed with total colpectomy with colpocleisis with perineoorrhaphy, levatorplasty forposterior vault support.  She acknowledges  no desire to be sexually active in the future.  Risks of anesthesia, infection, bleeding, injury to surrounding organs specifically bowel and bladder with possible need for repair noted.  The patient acknowledges and wishes to proceed. Medical clearance has been done and documented.     Lenoard Aden, M.D.     PGD/MEDQ  D:  11/25/2012  T:  11/25/2012  Job:  [id number]

## 2012-11-26 NOTE — Anesthesia Preprocedure Evaluation (Addendum)
Anesthesia Evaluation  Patient identified by MRN, date of birth, ID band Patient awake    Reviewed: Allergy & Precautions, H&P , NPO status , Patient's Chart, lab work & pertinent test results  Airway Mallampati: III TM Distance: >3 FB Neck ROM: Full    Dental no notable dental hx. (+) Teeth Intact and Caps   Pulmonary pneumonia -, resolved,  breath sounds clear to auscultation  Pulmonary exam normal       Cardiovascular Exercise Tolerance: Poor hypertension, Pt. on medications + CAD, + Peripheral Vascular Disease and +CHF + dysrhythmias Atrial Fibrillation + pacemaker Rhythm:Regular Rate:Normal  S/P clipping of cerebral anuerysm   Neuro/Psych PSYCHIATRIC DISORDERS Dementia Expressive Aphasia from CVA TIACVA, Residual Symptoms    GI/Hepatic negative GI ROS, Neg liver ROS,   Endo/Other  Hypothyroidism   Renal/GU negative Renal ROS   Pelvic Relaxation Cystocele    Musculoskeletal   Abdominal   Peds  Hematology negative hematology ROS (+)   Anesthesia Other Findings   Reproductive/Obstetrics negative OB ROS                        Anesthesia Physical Anesthesia Plan  ASA: III  Anesthesia Plan: Spinal   Post-op Pain Management:    Induction:   Airway Management Planned: Natural Airway  Additional Equipment:   Intra-op Plan:   Post-operative Plan:   Informed Consent: I have reviewed the patients History and Physical, chart, labs and discussed the procedure including the risks, benefits and alternatives for the proposed anesthesia with the patient or authorized representative who has indicated his/her understanding and acceptance.   Dental advisory given  Plan Discussed with: CRNA, Anesthesiologist and Surgeon  Anesthesia Plan Comments:         Anesthesia Quick Evaluation

## 2012-11-26 NOTE — Anesthesia Procedure Notes (Signed)
Spinal  Patient location during procedure: OR Start time: 11/26/2012 10:11 AM Staffing Anesthesiologist: FOSTER, MICHAEL A. Performed by: anesthesiologist  Preanesthetic Checklist Completed: patient identified, site marked, surgical consent, pre-op evaluation, timeout performed, IV checked, risks and benefits discussed and monitors and equipment checked Spinal Block Patient position: sitting Prep: site prepped and draped and DuraPrep Patient monitoring: heart rate, cardiac monitor, continuous pulse ox and blood pressure Approach: midline Location: L3-4 Injection technique: single-shot Needle Needle type: Sprotte  Needle gauge: 24 G Needle length: 9 cm Needle insertion depth: 5 cm Assessment Sensory level: T8 Additional Notes Patient tolerated procedure well. Adequate sensory level. Difficult due to poor position. Attempt x 3

## 2012-11-26 NOTE — Progress Notes (Signed)
Pharmacy  recommends  That pt needs of  Potassium  total  For  3  Bags of fluid  With 20 of potassium in each bag  Order written  Per MD  [place]

## 2012-11-26 NOTE — Transfer of Care (Signed)
Immediate Anesthesia Transfer of Care Note  Patient: [name]  Procedure(s) Performed: Procedure(s) with comments: Cystocele Repair with Lefort Colpocleisis.  colpectomy Levatorplasty Perineorraphy (N/A) - Cystocele Repair with Lefort Colpocleisis.  Patient Location: PACU  Anesthesia Type:Spinal  Level of Consciousness: awake and alert   Airway & Oxygen Therapy: Patient Spontanous Breathing  Post-op Assessment: Report given to PACU RN  Post vital signs: Reviewed  Complications: No apparent anesthesia complications

## 2012-11-26 NOTE — Op Note (Signed)
11/26/2012  11:26 AM  PATIENT:  [name]  79 y.o. female  PRE-OPERATIVE DIAGNOSIS:  Pelvic Relaxation    POST-OPERATIVE DIAGNOSIS:  pelvic relaxation  PROCEDURE:  Procedure(s): Cystocele Repair Total colpectomy with colpocleisis  Levatorplasty and  Perineorraphy Excision of vaginal granulation tissue and vaginal ulcer  SURGEON:  Surgeon(s): Lenoard Aden, MD Robley Fries, MD  ASSISTANTS: Juliene Pina, MD   ANESTHESIA:   local and spinal  ESTIMATED BLOOD LOSS: 50cc  DRAINS: Urinary Catheter (Foley)   LOCAL MEDICATIONS USED:  LIDOCAINE   SPECIMEN:  No Specimen  DISPOSITION OF SPECIMEN:  N/A  COUNTS:  YES  DICTATION #: [id number]  PLAN OF CARE: DC home  PATIENT DISPOSITION:  PACU - hemodynamically stable.

## 2012-11-26 NOTE — Anesthesia Postprocedure Evaluation (Signed)
  Anesthesia Post-op Note  Patient: [name]  Procedure(s) Performed: Procedure(s) with comments: Cystocele Repair with Lefort Colpocleisis.  colpectomy Levatorplasty Perineorraphy (N/A) - Cystocele Repair with Lefort Colpocleisis.  Patient Location: PACU and Women's Unit  Anesthesia Type:General  Level of Consciousness: awake, alert , oriented and patient cooperative  Airway and Oxygen Therapy: Patient Spontanous Breathing  Post-op Pain: none  Post-op Assessment: Post-op Vital signs reviewed and Patient's Cardiovascular Status Stable  Post-op Vital Signs: Reviewed and stable  Complications: No apparent anesthesia complications

## 2012-11-26 NOTE — Op Note (Signed)
NAMEVIVIEN, [name]            ACCOUNT NO.:  [id number]  MEDICAL RECORD NO.:  [id number]  LOCATION:  9319                          FACILITY:  [facility]  PHYSICIAN:  Lenoard Aden, M.D.DATE OF BIRTH:  06-Sep-1922  DATE OF PROCEDURE:  11/26/2012 DATE OF DISCHARGE:                              OPERATIVE REPORT   DESCRIPTION OF PROCEDURE:  After being apprised of risks of anesthesia, infection, bleeding, injury to abdominal organs, injury to bowel, bladder, possible abdominal organs, need for repair, the patient was brought to the operating room where she was administered a spinal anesthetic without complications.  Prepped and draped in usual sterile fashion.  Foley catheter placed.  Exam under anesthesia reveals total vault post hysterectomy prolapse with small cystocele.  At this time, the apex was grasped using 2 Allis clamps and the vaginal prolapse was divided into a 4-quadrant method, infiltrated with dilute Xylocaine epinephrine solution and one of the anterior quadrants is undermined preserving the pubovesical fascia and this vaginal strip was excised after the base of the descent/prolapse as outlined using electrocautery in a circumferential fashion.  Similar removals of each quadrant of vaginal tissue was performed undermining thin strips of tissue and leaving the fascia intact for complete removal of vaginal epithelium over the area of prolapse.  Good hemostasis was assured.  Multiple pursestring sutures were then placed to reduce the prolapse using a 2-0 Vicryl suture.  Interrupted sutures were also placed reapproximating the fascia in the midline.  At this time, a high levatorplasty stitch was also placed using 0 Vicryl suture x2 and each of these are tied with a finger in the rectum to assure no rectal injury.  At this time, then the cystocele is also reduced, the vaginal mucosa is then closed using a 3-0 Vicryl in a interrupted fashion.  Perineorrhaphy was then performed  in a standard fashion with aggressive levatorplasty performed with a 0 Vicryl x2.  The peritoneum was then closed using a 2-0 Vicryl and a 3-0 Vicryl. Good hemostasis was noted.  Total colpectomy with levatorplasty and posterior wall support was noted.  Rectal exam was negative.  Urine was clear.  The patient tolerated the procedure well and was transferred to recovery in good condition.     Lenoard Aden, M.D.     RJT/MEDQ  D:  11/26/2012  T:  11/26/2012  Job:  [id number]

## 2012-11-26 NOTE — Anesthesia Postprocedure Evaluation (Signed)
  Anesthesia Post-op Note  Patient: [name]  Procedure(s) Performed: Procedure(s) with comments: Cystocele Repair with Lefort Colpocleisis.  colpectomy Levatorplasty Perineorraphy (N/A) - Cystocele Repair with Lefort Colpocleisis.  Patient Location: PACU  Anesthesia Type:Spinal  Level of Consciousness: awake, alert  and oriented  Airway and Oxygen Therapy: Patient Spontanous Breathing  Post-op Pain: none  Post-op Assessment: Post-op Vital signs reviewed, Patient's Cardiovascular Status Stable, Respiratory Function Stable, Patent Airway, No signs of Nausea or vomiting, Pain level controlled, No headache, No backache, No residual numbness and No residual motor weakness  Post-op Vital Signs: Reviewed and stable  Complications: No apparent anesthesia complications

## 2012-11-26 NOTE — Progress Notes (Signed)
Patient ID: [name], female   DOB: 1921-10-20, 79 y.o.   MRN: [id number] Patient seen and examined. Consent witnessed and signed. No changes noted. Update completed.

## 2012-11-27 ENCOUNTER — Encounter (HOSPITAL_COMMUNITY): Payer: Self-pay | Admitting: Obstetrics and Gynecology

## 2012-11-27 MED ORDER — TRAMADOL HCL 50 MG PO TABS: 50.0000 mg | ORAL_TABLET | Freq: Four times a day (QID) | ORAL | Status: DC | PRN

## 2012-11-27 NOTE — Progress Notes (Signed)
Pt discharged to [facility] in care of family  Teaching complete

## 2012-11-27 NOTE — Progress Notes (Signed)
1 Day Post-Op Procedure(s) (LRB): Cystocele Repair with Lefort Colpocleisis.  colpectomy Levatorplasty Perineorraphy (N/A)  Subjective: Patient reports tolerating PO, + flatus and no problems voiding.    Objective: BP 109/63  Pulse 66  Temp(Src) 97.8 F (36.6 C) (Oral)  Resp 18  Ht 5' 4.5" (1.638 m)  Wt 61.236 kg (135 lb)  BMI 22.82 kg/m2  SpO2 95%  CMP     Component Value Date/Time   NA 138 11/26/2012 1356   K 2.9* 11/26/2012 1356   CL 102 11/26/2012 1356   CO2 25 11/26/2012 1356   GLUCOSE 119* 11/26/2012 1356   BUN 20 11/26/2012 1356   CREATININE 0.99 11/26/2012 1356   CALCIUM 8.7 11/26/2012 1356   PROT 6.5 11/22/2012 1500   ALBUMIN 3.5 11/22/2012 1500   AST 22 11/22/2012 1500   ALT 16 11/22/2012 1500   ALKPHOS 85 11/22/2012 1500   BILITOT 0.4 11/22/2012 1500   GFRNONAA 49* 11/26/2012 1356   GFRAA 57* 11/26/2012 1356     I have reviewed patient's vital signs, intake and output, medications and labs.  General: alert, cooperative and appears stated age Resp: clear to auscultation bilaterally and normal percussion bilaterally Cardio: regular rate and rhythm, S1, S2 normal, no murmur, click, rub or gallop and normal apical impulse GI: soft, non-tender; bowel sounds normal; no masses,  no organomegaly and normal findings: aorta normal and bowel sounds normal Extremities: extremities normal, atraumatic, no cyanosis or edema and Homans sign is negative, no sign of DVT Vaginal Bleeding: minimal  Assessment: s/p Procedure(s) with comments: Cystocele Repair with Lefort Colpocleisis.  colpectomy Levatorplasty Perineorraphy (N/A) - Cystocele Repair with Lefort Colpocleisis.: stable, progressing well and tolerating diet Hypokalemia- rpt in one week s/p replacement by pharmacy  Plan: Advance diet Encourage ambulation Advance to PO medication Discontinue IV fluids Discharge home Tramdol rx given Fu office one week  LOS: 1 day    [name] J 11/27/2012, 7:16 AM

## 2012-12-12 DIAGNOSIS — M6281 Muscle weakness (generalized): Secondary | ICD-10-CM | POA: Diagnosis not present

## 2012-12-21 DIAGNOSIS — M6281 Muscle weakness (generalized): Secondary | ICD-10-CM | POA: Diagnosis not present

## 2012-12-21 DIAGNOSIS — R279 Unspecified lack of coordination: Secondary | ICD-10-CM | POA: Diagnosis not present

## 2012-12-21 DIAGNOSIS — S12000A Unspecified displaced fracture of first cervical vertebra, initial encounter for closed fracture: Secondary | ICD-10-CM | POA: Diagnosis not present

## 2012-12-21 DIAGNOSIS — M545 Low back pain: Secondary | ICD-10-CM | POA: Diagnosis not present

## 2012-12-21 DIAGNOSIS — R32 Unspecified urinary incontinence: Secondary | ICD-10-CM | POA: Diagnosis not present

## 2012-12-27 DIAGNOSIS — M76899 Other specified enthesopathies of unspecified lower limb, excluding foot: Secondary | ICD-10-CM | POA: Diagnosis not present

## 2013-01-08 ENCOUNTER — Encounter: Payer: Self-pay | Admitting: Internal Medicine

## 2013-01-08 ENCOUNTER — Other Ambulatory Visit (INDEPENDENT_AMBULATORY_CARE_PROVIDER_SITE_OTHER): Payer: Medicare Other

## 2013-01-08 ENCOUNTER — Ambulatory Visit (INDEPENDENT_AMBULATORY_CARE_PROVIDER_SITE_OTHER): Payer: Medicare Other | Admitting: Internal Medicine

## 2013-01-08 VITALS — BP 110/78 | HR 75 | Temp 98.2°F | Wt 128.0 lb

## 2013-01-08 DIAGNOSIS — R7309 Other abnormal glucose: Secondary | ICD-10-CM

## 2013-01-08 DIAGNOSIS — I10 Essential (primary) hypertension: Secondary | ICD-10-CM

## 2013-01-08 DIAGNOSIS — E785 Hyperlipidemia, unspecified: Secondary | ICD-10-CM | POA: Diagnosis not present

## 2013-01-08 DIAGNOSIS — R634 Abnormal weight loss: Secondary | ICD-10-CM | POA: Diagnosis not present

## 2013-01-08 DIAGNOSIS — R7302 Impaired glucose tolerance (oral): Secondary | ICD-10-CM

## 2013-01-08 DIAGNOSIS — E039 Hypothyroidism, unspecified: Secondary | ICD-10-CM

## 2013-01-08 LAB — CBC WITH DIFFERENTIAL/PLATELET
Basophils Absolute: 0 10*3/uL (ref 0.0–0.1)
Eosinophils Absolute: 0.1 10*3/uL (ref 0.0–0.7)
Hemoglobin: 13.7 g/dL (ref 12.0–15.0)
Lymphocytes Relative: 21.6 % (ref 12.0–46.0)
MCHC: 33.3 g/dL (ref 30.0–36.0)
Neutro Abs: 6.7 10*3/uL (ref 1.4–7.7)
RDW: 14.4 % (ref 11.5–14.6)

## 2013-01-08 LAB — HEPATIC FUNCTION PANEL
ALT: 17 U/L (ref 0–35)
AST: 20 U/L (ref 0–37)
Alkaline Phosphatase: 55 U/L (ref 39–117)
Bilirubin, Direct: 0.1 mg/dL (ref 0.0–0.3)
Total Protein: 6.5 g/dL (ref 6.0–8.3)

## 2013-01-08 LAB — BASIC METABOLIC PANEL
CO2: 25 mEq/L (ref 19–32)
Chloride: 103 mEq/L (ref 96–112)
Potassium: 4.2 mEq/L (ref 3.5–5.1)
Sodium: 137 mEq/L (ref 135–145)

## 2013-01-08 LAB — LIPID PANEL: Total CHOL/HDL Ratio: 3

## 2013-01-08 NOTE — Patient Instructions (Signed)
Please continue all other medications as before, and refills have been done if requested. Please go to the LAB in the Basement (turn left off the elevator) for the tests to be done today You will be contacted by phone if any changes need to be made immediately.  Otherwise, you will receive a letter about your results with an explanation, but please check with Mychart first Thank you for enrolling in MyChart. Please follow the instructions below to securely access your online medical record. MyChart allows you to send messages to your doctor, view your test results, renew your prescriptions, schedule appointments, and more. Please return in 6 months, or sooner if needed

## 2013-01-08 NOTE — Assessment & Plan Note (Signed)
stable overall by history and exam, recent data reviewed with pt, and pt to continue medical treatment as before,  to f/u any worsening symptoms or concerns Lab Results  Component Value Date   TSH 4.50 01/08/2013

## 2013-01-08 NOTE — Assessment & Plan Note (Signed)
stable overall by history and exam, recent data reviewed with pt, and pt to continue medical treatment as before,  to f/u any worsening symptoms or concerns Lab Results  Component Value Date   HGBA1C 6.2* 09/26/2012    

## 2013-01-08 NOTE — Progress Notes (Signed)
Subjective:    Patient ID: [name], female    DOB: [birth year], 79 y.o.   MRN: [id number]  HPI  Here with family, doing quite well.  Has seen NS twice for compression fx, and no specific interventional tx needed to date.  S/p recent pelvic surgury with fatigue for one wk, but o/w remarkably improved after without complication.   Pt denies polydipsia, polyuria, and family has checked sugars several times with no elevated values in the past month.  Has has some wt loss from 138 to 128 since jan, possibly most since about the time of the surgury.  Denies worsening appetitie and  Pt denies fever, wt loss, night sweats, loss of appetite, or other constitutional symptoms. Dementia overall stable symptomatically with gradual worsening at best, and not assoc with behavioral changes such as hallucinations, paranoia, or agitation. Past Medical History  Diagnosis Date  . Aphasia 01/09/2009  . 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 CEREBROVASCULAR ACCIDENT, HX OF 11/20/2007  . CVA 01/09/2009  . DEMENTIA 05/19/2008  . DIVERTICULOSIS, COLON 11/20/2007  . FATIGUE 11/20/2007  . GLUCOSE INTOLERANCE 12/06/2010  . HYPERLIPIDEMIA 11/20/2007  . HYPERTENSION 11/20/2007    Echo was done 07/07/11: mod LVH, EF 55-60%, grade 1 diast dysfxn, mild MR, mild LAE, mod TR, PASP 39  . HYPOTHYROIDISM 11/20/2007  . IRON DEFICIENCY 12/06/2010  . PACEMAKER, PERMANENT 01/09/2009  . TIA 05/14/2003    elevated CE's in setting of TIA and falls in 10/12 - echo normal with no further cardiac w/u pursued  . Urinary incontinence 01/09/2009  . Impaired glucose tolerance 06/03/2011  . TIA (transient ischemic attack) 10/10/2011  . Dementia   . CHF (congestive heart failure)   . Coronary artery disease   . Arthritis     right hand  . Pacemaker 2002   Past Surgical History  Procedure Laterality Date  . Medtronic cynthia dual chamber pacemaker serial number was [id number]...04/15/2009    . Abdominal hysterectomy    . S/p cerebral aneurysm  1991   . S/p pacemaker ddd    . Cholecystectomy    . S/p retinal attachment    . S/p thyroidectomy    . Cystocele repair N/A 11/26/2012    Procedure: Cystocele Repair with Lefort Colpocleisis.  colpectomy Levatorplasty Perineorraphy;  Surgeon: Lenoard Aden, MD;  Location: [facility];  Service: Gynecology;  Laterality: N/A;  Cystocele Repair with Lefort Colpocleisis.    reports that she has never smoked. She does not have any smokeless tobacco history on file. She reports that she does not drink alcohol or use illicit drugs. family history includes Cancer in her mother. Allergies  Allergen Reactions  . Codeine     unknown   Current Outpatient Prescriptions on File Prior to Visit  Medication Sig Dispense Refill  . amLODipine (NORVASC) 10 MG tablet Take 1 tablet (10 mg total) by mouth daily.  90 tablet  3  . atorvastatin (LIPITOR) 10 MG tablet Take 1 tablet (10 mg total) by mouth daily.  90 tablet  3  . Calcium Carbonate-Vitamin D (CALTRATE 600+D) 600-400 MG-UNIT per tablet Take 1 tablet by mouth daily.        Marland Kitchen dipyridamole-aspirin (AGGRENOX) 200-25 MG per 12 hr capsule Take 1 capsule by mouth 2 (two) times daily.  180 capsule  3  . donepezil (ARICEPT) 10 MG tablet Take 1 tablet (10 mg total) by mouth daily.  90 tablet  3  . estradiol (ESTRACE) 0.1 MG/GM vaginal cream Place vaginally daily. Apply two  times per week      . folic acid-pyridoxine-cyancobalamin (FOLTX) 2.5-25-2 MG TABS Take 1 tablet by mouth daily.  90 each  3  . glucose blood (FREESTYLE LITE) test strip Use as instructed  100 each  12  . Lancets MISC 1 application by Does not apply route daily.  10 each  11  . levothyroxine (SYNTHROID, LEVOTHROID) 125 MCG tablet Take 1 tablet (125 mcg total) by mouth daily.  90 tablet  3  . Multiple Vitamin (MULTIVITAMIN WITH MINERALS) TABS Take 1 tablet by mouth daily. Centrum silver      . telmisartan (MICARDIS) 80 MG tablet Take 1 tablet (80 mg total) by mouth daily.  90 tablet  3  . traMADol  (ULTRAM) 50 MG tablet Take 1 tablet (50 mg total) by mouth every 6 (six) hours as needed for pain.  60 tablet  1  . traMADol (ULTRAM) 50 MG tablet Take 1 tablet (50 mg total) by mouth every 6 (six) hours as needed.  30 tablet  0   No current facility-administered medications on file prior to visit.   Review of Systems  Constitutional: Negative for unexpected weight change, or unusual diaphoresis  HENT: Negative for tinnitus.   Eyes: Negative for photophobia and visual disturbance.  Respiratory: Negative for choking and stridor.   Gastrointestinal: Negative for vomiting and blood in stool.  Genitourinary: Negative for hematuria and decreased urine volume.  Musculoskeletal: Negative for acute joint swelling Skin: Negative for color change and wound.  Neurological: Negative for tremors and numbness other than noted  Psychiatric/Behavioral: Negative for decreased concentration or  hyperactivity.       Objective:   Physical Exam BP 110/78  Pulse 75  Temp(Src) 98.2 F (36.8 C) (Oral)  Wt 128 lb (58.06 kg)  BMI 21.64 kg/m2  SpO2 95% VS noted, not ill appearing Constitutional: Pt appears well-developed and well-nourished.  HENT: Head: NCAT.  Right Ear: External ear normal.  Left Ear: External ear normal.  Eyes: Conjunctivae and EOM are normal. Pupils are equal, round, and reactive to light.  Neck: Normal range of motion. Neck supple.  Cardiovascular: Normal rate and regular rhythm.   Pulmonary/Chest: Effort normal and breath sounds normal.  Abd:  Soft, NT, non-distended, + BS Neurological: Pt is alert. Is at baseline confused  Skin: Skin is warm. No erythema. No LE edema Psychiatric: Pt behavior is normal. Not agitated    Assessment & Plan:

## 2013-01-08 NOTE — Assessment & Plan Note (Signed)
stable overall by history and exam, recent data reviewed with pt, and pt to continue medical treatment as before,  to f/u any worsening symptoms or concerns Lab Results  Component Value Date   LDLCALC 82 01/08/2013

## 2013-01-08 NOTE — Assessment & Plan Note (Signed)
stable overall by history and exam, recent data reviewed with pt, and pt to continue medical treatment as before,  to f/u any worsening symptoms or concerns BP Readings from Last 3 Encounters:  01/08/13 110/78  11/27/12 109/63  11/27/12 109/63

## 2013-01-09 DIAGNOSIS — Z9181 History of falling: Secondary | ICD-10-CM | POA: Diagnosis not present

## 2013-01-09 DIAGNOSIS — M6281 Muscle weakness (generalized): Secondary | ICD-10-CM | POA: Diagnosis not present

## 2013-01-09 DIAGNOSIS — R279 Unspecified lack of coordination: Secondary | ICD-10-CM | POA: Diagnosis not present

## 2013-01-09 DIAGNOSIS — M545 Low back pain: Secondary | ICD-10-CM | POA: Diagnosis not present

## 2013-01-09 DIAGNOSIS — R269 Unspecified abnormalities of gait and mobility: Secondary | ICD-10-CM | POA: Diagnosis not present

## 2013-01-10 LAB — PROTEIN ELECTROPHORESIS, SERUM
Albumin ELP: 62.2 % (ref 55.8–66.1)
Beta 2: 4.6 % (ref 3.2–6.5)
Gamma Globulin: 10.9 % — ABNORMAL LOW (ref 11.1–18.8)

## 2013-01-14 DIAGNOSIS — R269 Unspecified abnormalities of gait and mobility: Secondary | ICD-10-CM | POA: Diagnosis not present

## 2013-01-14 DIAGNOSIS — R279 Unspecified lack of coordination: Secondary | ICD-10-CM | POA: Diagnosis not present

## 2013-01-14 DIAGNOSIS — M6281 Muscle weakness (generalized): Secondary | ICD-10-CM | POA: Diagnosis not present

## 2013-01-14 DIAGNOSIS — M545 Low back pain: Secondary | ICD-10-CM | POA: Diagnosis not present

## 2013-01-14 DIAGNOSIS — Z9181 History of falling: Secondary | ICD-10-CM | POA: Diagnosis not present

## 2013-01-15 DIAGNOSIS — M545 Low back pain: Secondary | ICD-10-CM | POA: Diagnosis not present

## 2013-01-15 DIAGNOSIS — R269 Unspecified abnormalities of gait and mobility: Secondary | ICD-10-CM | POA: Diagnosis not present

## 2013-01-15 DIAGNOSIS — M6281 Muscle weakness (generalized): Secondary | ICD-10-CM | POA: Diagnosis not present

## 2013-01-15 DIAGNOSIS — R279 Unspecified lack of coordination: Secondary | ICD-10-CM | POA: Diagnosis not present

## 2013-01-15 DIAGNOSIS — Z9181 History of falling: Secondary | ICD-10-CM | POA: Diagnosis not present

## 2013-01-16 DIAGNOSIS — M545 Low back pain: Secondary | ICD-10-CM | POA: Diagnosis not present

## 2013-01-16 DIAGNOSIS — R279 Unspecified lack of coordination: Secondary | ICD-10-CM | POA: Diagnosis not present

## 2013-01-16 DIAGNOSIS — Z9181 History of falling: Secondary | ICD-10-CM | POA: Diagnosis not present

## 2013-01-16 DIAGNOSIS — M6281 Muscle weakness (generalized): Secondary | ICD-10-CM | POA: Diagnosis not present

## 2013-01-16 DIAGNOSIS — R269 Unspecified abnormalities of gait and mobility: Secondary | ICD-10-CM | POA: Diagnosis not present

## 2013-01-17 DIAGNOSIS — M545 Low back pain: Secondary | ICD-10-CM | POA: Diagnosis not present

## 2013-01-17 DIAGNOSIS — Z9181 History of falling: Secondary | ICD-10-CM | POA: Diagnosis not present

## 2013-01-17 DIAGNOSIS — M6281 Muscle weakness (generalized): Secondary | ICD-10-CM

## 2013-01-17 DIAGNOSIS — R269 Unspecified abnormalities of gait and mobility: Secondary | ICD-10-CM | POA: Diagnosis not present

## 2013-01-17 DIAGNOSIS — R279 Unspecified lack of coordination: Secondary | ICD-10-CM | POA: Diagnosis not present

## 2013-01-21 DIAGNOSIS — R269 Unspecified abnormalities of gait and mobility: Secondary | ICD-10-CM | POA: Diagnosis not present

## 2013-01-21 DIAGNOSIS — Z9181 History of falling: Secondary | ICD-10-CM | POA: Diagnosis not present

## 2013-01-21 DIAGNOSIS — M545 Low back pain: Secondary | ICD-10-CM | POA: Diagnosis not present

## 2013-01-21 DIAGNOSIS — R279 Unspecified lack of coordination: Secondary | ICD-10-CM | POA: Diagnosis not present

## 2013-01-21 DIAGNOSIS — M6281 Muscle weakness (generalized): Secondary | ICD-10-CM | POA: Diagnosis not present

## 2013-01-22 DIAGNOSIS — M545 Low back pain: Secondary | ICD-10-CM | POA: Diagnosis not present

## 2013-01-22 DIAGNOSIS — M6281 Muscle weakness (generalized): Secondary | ICD-10-CM | POA: Diagnosis not present

## 2013-01-22 DIAGNOSIS — R269 Unspecified abnormalities of gait and mobility: Secondary | ICD-10-CM | POA: Diagnosis not present

## 2013-01-22 DIAGNOSIS — R279 Unspecified lack of coordination: Secondary | ICD-10-CM | POA: Diagnosis not present

## 2013-01-22 DIAGNOSIS — Z9181 History of falling: Secondary | ICD-10-CM | POA: Diagnosis not present

## 2013-01-23 DIAGNOSIS — M6281 Muscle weakness (generalized): Secondary | ICD-10-CM | POA: Diagnosis not present

## 2013-01-23 DIAGNOSIS — R279 Unspecified lack of coordination: Secondary | ICD-10-CM | POA: Diagnosis not present

## 2013-01-23 DIAGNOSIS — Z9181 History of falling: Secondary | ICD-10-CM | POA: Diagnosis not present

## 2013-01-23 DIAGNOSIS — R269 Unspecified abnormalities of gait and mobility: Secondary | ICD-10-CM | POA: Diagnosis not present

## 2013-01-23 DIAGNOSIS — M545 Low back pain: Secondary | ICD-10-CM | POA: Diagnosis not present

## 2013-01-28 ENCOUNTER — Other Ambulatory Visit: Payer: Self-pay | Admitting: Internal Medicine

## 2013-01-28 ENCOUNTER — Ambulatory Visit (INDEPENDENT_AMBULATORY_CARE_PROVIDER_SITE_OTHER): Payer: Medicare Other | Admitting: *Deleted

## 2013-01-28 DIAGNOSIS — I442 Atrioventricular block, complete: Secondary | ICD-10-CM

## 2013-01-28 DIAGNOSIS — M545 Low back pain: Secondary | ICD-10-CM | POA: Diagnosis not present

## 2013-01-28 DIAGNOSIS — R269 Unspecified abnormalities of gait and mobility: Secondary | ICD-10-CM | POA: Diagnosis not present

## 2013-01-28 DIAGNOSIS — M6281 Muscle weakness (generalized): Secondary | ICD-10-CM | POA: Diagnosis not present

## 2013-01-28 DIAGNOSIS — R279 Unspecified lack of coordination: Secondary | ICD-10-CM | POA: Diagnosis not present

## 2013-01-28 DIAGNOSIS — Z950 Presence of cardiac pacemaker: Secondary | ICD-10-CM | POA: Diagnosis not present

## 2013-01-28 DIAGNOSIS — Z9181 History of falling: Secondary | ICD-10-CM | POA: Diagnosis not present

## 2013-01-30 DIAGNOSIS — M6281 Muscle weakness (generalized): Secondary | ICD-10-CM | POA: Diagnosis not present

## 2013-01-30 DIAGNOSIS — R279 Unspecified lack of coordination: Secondary | ICD-10-CM | POA: Diagnosis not present

## 2013-01-30 DIAGNOSIS — M545 Low back pain: Secondary | ICD-10-CM | POA: Diagnosis not present

## 2013-01-30 DIAGNOSIS — R269 Unspecified abnormalities of gait and mobility: Secondary | ICD-10-CM | POA: Diagnosis not present

## 2013-01-30 DIAGNOSIS — Z9181 History of falling: Secondary | ICD-10-CM | POA: Diagnosis not present

## 2013-01-31 DIAGNOSIS — M6281 Muscle weakness (generalized): Secondary | ICD-10-CM | POA: Diagnosis not present

## 2013-01-31 DIAGNOSIS — R279 Unspecified lack of coordination: Secondary | ICD-10-CM | POA: Diagnosis not present

## 2013-01-31 DIAGNOSIS — R269 Unspecified abnormalities of gait and mobility: Secondary | ICD-10-CM | POA: Diagnosis not present

## 2013-01-31 DIAGNOSIS — Z9181 History of falling: Secondary | ICD-10-CM | POA: Diagnosis not present

## 2013-01-31 DIAGNOSIS — M545 Low back pain: Secondary | ICD-10-CM | POA: Diagnosis not present

## 2013-02-03 LAB — REMOTE PACEMAKER DEVICE
AL IMPEDENCE PM: 618 Ohm
ATRIAL PACING PM: 39
BATTERY VOLTAGE: 2.79 V
RV LEAD IMPEDENCE PM: 562 Ohm
VENTRICULAR PACING PM: 100

## 2013-02-06 DIAGNOSIS — M6281 Muscle weakness (generalized): Secondary | ICD-10-CM | POA: Diagnosis not present

## 2013-02-06 DIAGNOSIS — M545 Low back pain: Secondary | ICD-10-CM | POA: Diagnosis not present

## 2013-02-06 DIAGNOSIS — R279 Unspecified lack of coordination: Secondary | ICD-10-CM | POA: Diagnosis not present

## 2013-02-06 DIAGNOSIS — R269 Unspecified abnormalities of gait and mobility: Secondary | ICD-10-CM | POA: Diagnosis not present

## 2013-02-06 DIAGNOSIS — Z9181 History of falling: Secondary | ICD-10-CM | POA: Diagnosis not present

## 2013-02-13 ENCOUNTER — Encounter: Payer: Self-pay | Admitting: *Deleted

## 2013-02-14 DIAGNOSIS — M545 Low back pain: Secondary | ICD-10-CM | POA: Diagnosis not present

## 2013-02-14 DIAGNOSIS — M6281 Muscle weakness (generalized): Secondary | ICD-10-CM | POA: Diagnosis not present

## 2013-02-14 DIAGNOSIS — R269 Unspecified abnormalities of gait and mobility: Secondary | ICD-10-CM | POA: Diagnosis not present

## 2013-02-14 DIAGNOSIS — R279 Unspecified lack of coordination: Secondary | ICD-10-CM | POA: Diagnosis not present

## 2013-02-14 DIAGNOSIS — Z9181 History of falling: Secondary | ICD-10-CM | POA: Diagnosis not present

## 2013-02-20 DIAGNOSIS — M6281 Muscle weakness (generalized): Secondary | ICD-10-CM | POA: Diagnosis not present

## 2013-02-20 DIAGNOSIS — R269 Unspecified abnormalities of gait and mobility: Secondary | ICD-10-CM | POA: Diagnosis not present

## 2013-02-20 DIAGNOSIS — Z9181 History of falling: Secondary | ICD-10-CM | POA: Diagnosis not present

## 2013-02-20 DIAGNOSIS — M545 Low back pain: Secondary | ICD-10-CM | POA: Diagnosis not present

## 2013-02-20 DIAGNOSIS — R279 Unspecified lack of coordination: Secondary | ICD-10-CM | POA: Diagnosis not present

## 2013-02-26 ENCOUNTER — Encounter: Payer: Self-pay | Admitting: Internal Medicine

## 2013-02-27 DIAGNOSIS — R279 Unspecified lack of coordination: Secondary | ICD-10-CM | POA: Diagnosis not present

## 2013-02-27 DIAGNOSIS — Z9181 History of falling: Secondary | ICD-10-CM | POA: Diagnosis not present

## 2013-02-27 DIAGNOSIS — R269 Unspecified abnormalities of gait and mobility: Secondary | ICD-10-CM | POA: Diagnosis not present

## 2013-02-27 DIAGNOSIS — M545 Low back pain, unspecified: Secondary | ICD-10-CM | POA: Diagnosis not present

## 2013-02-27 DIAGNOSIS — M6281 Muscle weakness (generalized): Secondary | ICD-10-CM | POA: Diagnosis not present

## 2013-03-27 DIAGNOSIS — M545 Low back pain, unspecified: Secondary | ICD-10-CM | POA: Diagnosis not present

## 2013-03-27 DIAGNOSIS — R269 Unspecified abnormalities of gait and mobility: Secondary | ICD-10-CM | POA: Diagnosis not present

## 2013-03-27 DIAGNOSIS — M6281 Muscle weakness (generalized): Secondary | ICD-10-CM | POA: Diagnosis not present

## 2013-03-27 DIAGNOSIS — Z9181 History of falling: Secondary | ICD-10-CM | POA: Diagnosis not present

## 2013-03-27 DIAGNOSIS — R279 Unspecified lack of coordination: Secondary | ICD-10-CM | POA: Diagnosis not present

## 2013-04-29 DIAGNOSIS — Z9181 History of falling: Secondary | ICD-10-CM | POA: Diagnosis not present

## 2013-04-29 DIAGNOSIS — M6281 Muscle weakness (generalized): Secondary | ICD-10-CM | POA: Diagnosis not present

## 2013-04-29 DIAGNOSIS — R269 Unspecified abnormalities of gait and mobility: Secondary | ICD-10-CM | POA: Diagnosis not present

## 2013-04-29 DIAGNOSIS — R279 Unspecified lack of coordination: Secondary | ICD-10-CM | POA: Diagnosis not present

## 2013-04-29 DIAGNOSIS — M545 Low back pain, unspecified: Secondary | ICD-10-CM | POA: Diagnosis not present

## 2013-05-06 ENCOUNTER — Encounter: Payer: Self-pay | Admitting: Internal Medicine

## 2013-05-06 ENCOUNTER — Ambulatory Visit (INDEPENDENT_AMBULATORY_CARE_PROVIDER_SITE_OTHER): Payer: Medicare Other | Admitting: *Deleted

## 2013-05-06 DIAGNOSIS — I442 Atrioventricular block, complete: Secondary | ICD-10-CM

## 2013-05-06 DIAGNOSIS — Z950 Presence of cardiac pacemaker: Secondary | ICD-10-CM

## 2013-05-10 ENCOUNTER — Encounter: Payer: Self-pay | Admitting: Internal Medicine

## 2013-05-13 LAB — REMOTE PACEMAKER DEVICE
ATRIAL PACING PM: 37
BAMS-0001: 175 {beats}/min
RV LEAD THRESHOLD: 0.625 V

## 2013-05-17 ENCOUNTER — Encounter: Payer: Self-pay | Admitting: *Deleted

## 2013-05-29 DIAGNOSIS — R279 Unspecified lack of coordination: Secondary | ICD-10-CM | POA: Diagnosis not present

## 2013-05-29 DIAGNOSIS — M6281 Muscle weakness (generalized): Secondary | ICD-10-CM | POA: Diagnosis not present

## 2013-05-29 DIAGNOSIS — R269 Unspecified abnormalities of gait and mobility: Secondary | ICD-10-CM | POA: Diagnosis not present

## 2013-05-30 DIAGNOSIS — M6281 Muscle weakness (generalized): Secondary | ICD-10-CM | POA: Diagnosis not present

## 2013-05-30 DIAGNOSIS — R269 Unspecified abnormalities of gait and mobility: Secondary | ICD-10-CM | POA: Diagnosis not present

## 2013-05-30 DIAGNOSIS — R279 Unspecified lack of coordination: Secondary | ICD-10-CM | POA: Diagnosis not present

## 2013-06-03 DIAGNOSIS — Z23 Encounter for immunization: Secondary | ICD-10-CM | POA: Diagnosis not present

## 2013-06-05 DIAGNOSIS — M6281 Muscle weakness (generalized): Secondary | ICD-10-CM | POA: Diagnosis not present

## 2013-06-05 DIAGNOSIS — R279 Unspecified lack of coordination: Secondary | ICD-10-CM | POA: Diagnosis not present

## 2013-06-05 DIAGNOSIS — R269 Unspecified abnormalities of gait and mobility: Secondary | ICD-10-CM | POA: Diagnosis not present

## 2013-07-01 ENCOUNTER — Encounter (HOSPITAL_COMMUNITY): Payer: Self-pay | Admitting: *Deleted

## 2013-07-01 ENCOUNTER — Emergency Department (HOSPITAL_COMMUNITY): Payer: Medicare Other

## 2013-07-01 ENCOUNTER — Inpatient Hospital Stay (HOSPITAL_COMMUNITY)
Admission: EM | Admit: 2013-07-01 | Discharge: 2013-07-05 | DRG: 470 | Disposition: A | Discharge status: Home / Self Care | Payer: Medicare Other | Attending: Internal Medicine | Admitting: Internal Medicine

## 2013-07-01 DIAGNOSIS — I69922 Dysarthria following unspecified cerebrovascular disease: Secondary | ICD-10-CM

## 2013-07-01 DIAGNOSIS — E876 Hypokalemia: Secondary | ICD-10-CM | POA: Diagnosis present

## 2013-07-01 DIAGNOSIS — F0390 Unspecified dementia without behavioral disturbance: Secondary | ICD-10-CM | POA: Diagnosis not present

## 2013-07-01 DIAGNOSIS — I509 Heart failure, unspecified: Secondary | ICD-10-CM | POA: Diagnosis not present

## 2013-07-01 DIAGNOSIS — Z79899 Other long term (current) drug therapy: Secondary | ICD-10-CM | POA: Diagnosis not present

## 2013-07-01 DIAGNOSIS — R1110 Vomiting, unspecified: Secondary | ICD-10-CM | POA: Diagnosis not present

## 2013-07-01 DIAGNOSIS — R4701 Aphasia: Secondary | ICD-10-CM | POA: Diagnosis present

## 2013-07-01 DIAGNOSIS — Z96649 Presence of unspecified artificial hip joint: Secondary | ICD-10-CM | POA: Diagnosis not present

## 2013-07-01 DIAGNOSIS — W010XXA Fall on same level from slipping, tripping and stumbling without subsequent striking against object, initial encounter: Secondary | ICD-10-CM | POA: Diagnosis present

## 2013-07-01 DIAGNOSIS — I5032 Chronic diastolic (congestive) heart failure: Secondary | ICD-10-CM | POA: Diagnosis present

## 2013-07-01 DIAGNOSIS — Z9181 History of falling: Secondary | ICD-10-CM

## 2013-07-01 DIAGNOSIS — I2510 Atherosclerotic heart disease of native coronary artery without angina pectoris: Secondary | ICD-10-CM | POA: Diagnosis present

## 2013-07-01 DIAGNOSIS — D509 Iron deficiency anemia, unspecified: Secondary | ICD-10-CM

## 2013-07-01 DIAGNOSIS — D649 Anemia, unspecified: Secondary | ICD-10-CM | POA: Diagnosis not present

## 2013-07-01 DIAGNOSIS — Z950 Presence of cardiac pacemaker: Secondary | ICD-10-CM | POA: Diagnosis present

## 2013-07-01 DIAGNOSIS — M1990 Unspecified osteoarthritis, unspecified site: Secondary | ICD-10-CM | POA: Diagnosis not present

## 2013-07-01 DIAGNOSIS — R269 Unspecified abnormalities of gait and mobility: Secondary | ICD-10-CM | POA: Diagnosis not present

## 2013-07-01 DIAGNOSIS — I442 Atrioventricular block, complete: Secondary | ICD-10-CM | POA: Diagnosis present

## 2013-07-01 DIAGNOSIS — S72002A Fracture of unspecified part of neck of left femur, initial encounter for closed fracture: Secondary | ICD-10-CM

## 2013-07-01 DIAGNOSIS — S298XXA Other specified injuries of thorax, initial encounter: Secondary | ICD-10-CM | POA: Diagnosis not present

## 2013-07-01 DIAGNOSIS — Z8673 Personal history of transient ischemic attack (TIA), and cerebral infarction without residual deficits: Secondary | ICD-10-CM

## 2013-07-01 DIAGNOSIS — S72009D Fracture of unspecified part of neck of unspecified femur, subsequent encounter for closed fracture with routine healing: Secondary | ICD-10-CM | POA: Diagnosis not present

## 2013-07-01 DIAGNOSIS — I10 Essential (primary) hypertension: Secondary | ICD-10-CM | POA: Diagnosis not present

## 2013-07-01 DIAGNOSIS — M6281 Muscle weakness (generalized): Secondary | ICD-10-CM | POA: Diagnosis not present

## 2013-07-01 DIAGNOSIS — E039 Hypothyroidism, unspecified: Secondary | ICD-10-CM | POA: Diagnosis not present

## 2013-07-01 DIAGNOSIS — I69920 Aphasia following unspecified cerebrovascular disease: Secondary | ICD-10-CM

## 2013-07-01 DIAGNOSIS — S72033A Displaced midcervical fracture of unspecified femur, initial encounter for closed fracture: Secondary | ICD-10-CM | POA: Diagnosis not present

## 2013-07-01 DIAGNOSIS — K5900 Constipation, unspecified: Secondary | ICD-10-CM | POA: Diagnosis present

## 2013-07-01 DIAGNOSIS — E785 Hyperlipidemia, unspecified: Secondary | ICD-10-CM | POA: Diagnosis present

## 2013-07-01 DIAGNOSIS — M25559 Pain in unspecified hip: Secondary | ICD-10-CM | POA: Diagnosis not present

## 2013-07-01 DIAGNOSIS — Z471 Aftercare following joint replacement surgery: Secondary | ICD-10-CM | POA: Diagnosis not present

## 2013-07-01 DIAGNOSIS — E861 Hypovolemia: Secondary | ICD-10-CM | POA: Diagnosis present

## 2013-07-01 DIAGNOSIS — I4891 Unspecified atrial fibrillation: Secondary | ICD-10-CM | POA: Diagnosis present

## 2013-07-01 DIAGNOSIS — Y9210 Unspecified residential institution as the place of occurrence of the external cause: Secondary | ICD-10-CM | POA: Diagnosis not present

## 2013-07-01 DIAGNOSIS — M129 Arthropathy, unspecified: Secondary | ICD-10-CM | POA: Diagnosis present

## 2013-07-01 DIAGNOSIS — S79919A Unspecified injury of unspecified hip, initial encounter: Secondary | ICD-10-CM | POA: Diagnosis not present

## 2013-07-01 DIAGNOSIS — S7290XA Unspecified fracture of unspecified femur, initial encounter for closed fracture: Secondary | ICD-10-CM

## 2013-07-01 DIAGNOSIS — I959 Hypotension, unspecified: Secondary | ICD-10-CM | POA: Diagnosis not present

## 2013-07-01 DIAGNOSIS — R279 Unspecified lack of coordination: Secondary | ICD-10-CM | POA: Diagnosis not present

## 2013-07-01 DIAGNOSIS — Z8679 Personal history of other diseases of the circulatory system: Secondary | ICD-10-CM

## 2013-07-01 DIAGNOSIS — S72009A Fracture of unspecified part of neck of unspecified femur, initial encounter for closed fracture: Secondary | ICD-10-CM

## 2013-07-01 DIAGNOSIS — Z4789 Encounter for other orthopedic aftercare: Secondary | ICD-10-CM | POA: Diagnosis not present

## 2013-07-01 DIAGNOSIS — Y92009 Unspecified place in unspecified non-institutional (private) residence as the place of occurrence of the external cause: Secondary | ICD-10-CM

## 2013-07-01 DIAGNOSIS — R296 Repeated falls: Secondary | ICD-10-CM | POA: Diagnosis not present

## 2013-07-01 HISTORY — DX: Unspecified fracture of unspecified femur, initial encounter for closed fracture: S72.90XA

## 2013-07-01 LAB — CBC WITH DIFFERENTIAL/PLATELET
Basophils Absolute: 0 10*3/uL (ref 0.0–0.1)
Eosinophils Absolute: 0.2 10*3/uL (ref 0.0–0.7)
Lymphocytes Relative: 26 % (ref 12–46)
Lymphs Abs: 1.6 10*3/uL (ref 0.7–4.0)
Neutrophils Relative %: 60 % (ref 43–77)
Platelets: 254 10*3/uL (ref 150–400)
RBC: 4.53 MIL/uL (ref 3.87–5.11)
WBC: 6.1 10*3/uL (ref 4.0–10.5)

## 2013-07-01 LAB — BASIC METABOLIC PANEL
CO2: 22 mEq/L (ref 19–32)
GFR calc non Af Amer: 48 mL/min — ABNORMAL LOW (ref >90)
Glucose, Bld: 106 mg/dL — ABNORMAL HIGH (ref 70–99)
Potassium: 3.4 mEq/L — ABNORMAL LOW (ref 3.5–5.1)
Sodium: 140 mEq/L (ref 135–145)

## 2013-07-01 LAB — URINE MICROSCOPIC-ADD ON

## 2013-07-01 LAB — URINALYSIS, ROUTINE W REFLEX MICROSCOPIC
Glucose, UA: NEGATIVE mg/dL
Ketones, ur: 15 mg/dL — AB
Protein, ur: NEGATIVE mg/dL
Specific Gravity, Urine: 1.022 (ref 1.005–1.030)
Urobilinogen, UA: 0.2 mg/dL (ref 0.0–1.0)

## 2013-07-01 MED ORDER — ONDANSETRON HCL 4 MG PO TABS: 4.0000 mg | ORAL_TABLET | Freq: Four times a day (QID) | ORAL | Status: DC | PRN

## 2013-07-01 MED ORDER — DONEPEZIL HCL 10 MG PO TABS
10.0000 mg | ORAL_TABLET | Freq: Every day | ORAL | Status: DC
  Administered 2013-07-01 – 2013-07-05 (×5): 10 mg via ORAL
  Filled 2013-07-01 (×5): qty 1

## 2013-07-01 MED ORDER — ONDANSETRON HCL 4 MG/2ML IJ SOLN
4.0000 mg | Freq: Four times a day (QID) | INTRAMUSCULAR | Status: DC | PRN
  Administered 2013-07-04: 4 mg via INTRAVENOUS
  Filled 2013-07-01: qty 2

## 2013-07-01 MED ORDER — CALCIUM CARBONATE-VITAMIN D 600-400 MG-UNIT PO TABS
1.0000 | ORAL_TABLET | Freq: Every day | ORAL | Status: DC
Start: 2013-07-01 — End: 2013-07-01

## 2013-07-01 MED ORDER — LEVOTHYROXINE SODIUM 125 MCG PO TABS
125.0000 ug | ORAL_TABLET | Freq: Every day | ORAL | Status: DC
  Administered 2013-07-01 – 2013-07-05 (×5): 125 ug via ORAL
  Filled 2013-07-01 (×7): qty 1

## 2013-07-01 MED ORDER — SODIUM CHLORIDE 0.9 % IJ SOLN
3.0000 mL | Freq: Two times a day (BID) | INTRAMUSCULAR | Status: DC
  Administered 2013-07-01 – 2013-07-04 (×5): 3 mL via INTRAVENOUS

## 2013-07-01 MED ORDER — HEPARIN SODIUM (PORCINE) 5000 UNIT/ML IJ SOLN
5000.0000 [IU] | Freq: Three times a day (TID) | INTRAMUSCULAR | Status: DC
  Administered 2013-07-01 – 2013-07-05 (×10): 5000 [IU] via SUBCUTANEOUS
  Filled 2013-07-01 (×16): qty 1

## 2013-07-01 MED ORDER — POTASSIUM CHLORIDE IN NACL 20-0.9 MEQ/L-% IV SOLN
INTRAVENOUS | Status: DC
  Administered 2013-07-01 – 2013-07-02 (×2): via INTRAVENOUS
  Filled 2013-07-01 (×3): qty 1000

## 2013-07-01 MED ORDER — ALBUTEROL SULFATE (5 MG/ML) 0.5% IN NEBU: 2.5000 mg | INHALATION_SOLUTION | RESPIRATORY_TRACT | Status: DC | PRN

## 2013-07-01 MED ORDER — MORPHINE SULFATE 4 MG/ML IJ SOLN
4.0000 mg | Freq: Once | INTRAMUSCULAR | Status: AC
  Administered 2013-07-01: 4 mg via INTRAVENOUS
  Filled 2013-07-01: qty 1

## 2013-07-01 MED ORDER — CALCIUM CARBONATE-VITAMIN D 500-200 MG-UNIT PO TABS
1.0000 | ORAL_TABLET | Freq: Every day | ORAL | Status: DC
  Administered 2013-07-01 – 2013-07-05 (×4): 1 via ORAL
  Filled 2013-07-01 (×8): qty 1

## 2013-07-01 MED ORDER — ONDANSETRON HCL 4 MG/2ML IJ SOLN
4.0000 mg | Freq: Once | INTRAMUSCULAR | Status: AC
  Administered 2013-07-01: 4 mg via INTRAVENOUS
  Filled 2013-07-01: qty 2

## 2013-07-01 MED ORDER — ACETAMINOPHEN 650 MG RE SUPP: 650.0000 mg | Freq: Four times a day (QID) | RECTAL | Status: DC | PRN

## 2013-07-01 MED ORDER — AMLODIPINE BESYLATE 10 MG PO TABS
10.0000 mg | ORAL_TABLET | Freq: Every day | ORAL | Status: DC
  Administered 2013-07-01 – 2013-07-03 (×3): 10 mg via ORAL
  Filled 2013-07-01 (×4): qty 1

## 2013-07-01 MED ORDER — IRBESARTAN 300 MG PO TABS
300.0000 mg | ORAL_TABLET | Freq: Every day | ORAL | Status: DC
  Administered 2013-07-01 – 2013-07-03 (×3): 300 mg via ORAL
  Filled 2013-07-01 (×3): qty 1

## 2013-07-01 MED ORDER — ACETAMINOPHEN 325 MG PO TABS: 650.0000 mg | ORAL_TABLET | Freq: Four times a day (QID) | ORAL | Status: DC | PRN

## 2013-07-01 MED ORDER — MORPHINE SULFATE 2 MG/ML IJ SOLN
0.5000 mg | INTRAMUSCULAR | Status: DC | PRN
  Administered 2013-07-01 – 2013-07-02 (×4): 0.5 mg via INTRAVENOUS
  Filled 2013-07-01 (×5): qty 1

## 2013-07-01 MED ORDER — ADULT MULTIVITAMIN W/MINERALS CH
1.0000 | ORAL_TABLET | Freq: Every day | ORAL | Status: DC
  Administered 2013-07-01 – 2013-07-05 (×5): 1 via ORAL
  Filled 2013-07-01 (×5): qty 1

## 2013-07-01 MED ORDER — ATORVASTATIN CALCIUM 10 MG PO TABS
10.0000 mg | ORAL_TABLET | Freq: Every day | ORAL | Status: DC
  Administered 2013-07-01 – 2013-07-05 (×5): 10 mg via ORAL
  Filled 2013-07-01 (×6): qty 1

## 2013-07-01 MED ORDER — SODIUM CHLORIDE 0.9 % IV SOLN
INTRAVENOUS | Status: DC
  Administered 2013-07-01: 20 mL/h via INTRAVENOUS

## 2013-07-01 NOTE — Progress Notes (Signed)
Orthopedic Tech Progress Note Patient Details:  [name] 14-Jun-1922 [id number]  Patient ID: [name], female   DOB: 1922/04/20, 79 y.o.   MRN: [id number] Trapeze bar patient helper  Nikki Dom 07/01/2013, 2:30 PM

## 2013-07-01 NOTE — Consult Note (Signed)
Reason for Consult: Left hip fracture Referring Physician:  Hospitalist/Emergency Room  [name] is an 79 y.o. female.  HPI: 79yo female resident of [name] had a ground level fall today.  She was brought to the ER due to inability to bear weight and obvious deformity.  She was admitted to the medical service and cleared by cardiology.  Past Medical History  Diagnosis Date  . Aphasia 01/09/2009  . 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 CEREBROVASCULAR ACCIDENT, HX OF 11/20/2007  . CVA 01/09/2009  . DEMENTIA 05/19/2008  . DIVERTICULOSIS, COLON 11/20/2007  . FATIGUE 11/20/2007  . GLUCOSE INTOLERANCE 12/06/2010  . HYPERLIPIDEMIA 11/20/2007  . HYPERTENSION 11/20/2007    Echo was done 07/07/11: mod LVH, EF 55-60%, grade 1 diast dysfxn, mild MR, mild LAE, mod TR, PASP 39  . HYPOTHYROIDISM 11/20/2007  . IRON DEFICIENCY 12/06/2010  . PACEMAKER, PERMANENT 01/09/2009  . TIA 05/14/2003    elevated CE's in setting of TIA and falls in 10/12 - echo normal with no further cardiac w/u pursued  . Urinary incontinence 01/09/2009  . Impaired glucose tolerance 06/03/2011  . TIA (transient ischemic attack) 10/10/2011  . Dementia   . CHF (congestive heart failure)   . Coronary artery disease   . Arthritis     right hand  . Pacemaker 2002  . Femoral fracture 07/01/2013    Past Surgical History  Procedure Laterality Date  . Medtronic cynthia dual chamber pacemaker serial number was [id number]...04/15/2009    . Abdominal hysterectomy    . S/p cerebral aneurysm  1991  . S/p pacemaker ddd    . Cholecystectomy    . S/p retinal attachment    . S/p thyroidectomy    . Cystocele repair N/A 11/26/2012    Procedure: Cystocele Repair with Lefort Colpocleisis.  colpectomy Levatorplasty Perineorraphy;  Surgeon: Lenoard Aden, MD;  Location: [facility];  Service: Gynecology;  Laterality: N/A;  Cystocele Repair with Lefort Colpocleisis.    Family History  Problem Relation Age of Onset  . Cancer  Mother     breast cancer    Social History:  reports that she has never smoked. She has never used smokeless tobacco. She reports that she does not drink alcohol or use illicit drugs.  Allergies:  Allergies  Allergen Reactions  . Codeine     unknown    Medications:  I have reviewed the patient's current medications. Scheduled: . amLODipine  10 mg Oral Daily  . atorvastatin  10 mg Oral Daily  . calcium-vitamin D  1 tablet Oral Q breakfast  . donepezil  10 mg Oral Daily  . heparin  5,000 Units Subcutaneous Q8H  . irbesartan  300 mg Oral Daily  . levothyroxine  125 mcg Oral QAC breakfast  . multivitamin with minerals  1 tablet Oral Daily  . sodium chloride  3 mL Intravenous Q12H    Results for orders placed during the hospital encounter of 07/01/13 (from the past 24 hour(s))  CBC WITH DIFFERENTIAL     Status: None   Collection Time    07/01/13  9:26 AM      Result Value Range   WBC 6.1  4.0 - 10.5 K/uL   RBC 4.53  3.87 - 5.11 MIL/uL   Hemoglobin 14.3  12.0 - 15.0 g/dL   HCT 69.6  29.5 - 28.4 %   MCV 89.4  78.0 - 100.0 fL   MCH 31.6  26.0 - 34.0 pg   MCHC 35.3  30.0 -  36.0 g/dL   RDW 40.9  81.1 - 91.4 %   Platelets 254  150 - 400 K/uL   Neutrophils Relative % 60  43 - 77 %   Neutro Abs 3.7  1.7 - 7.7 K/uL   Lymphocytes Relative 26  12 - 46 %   Lymphs Abs 1.6  0.7 - 4.0 K/uL   Monocytes Relative 10  3 - 12 %   Monocytes Absolute 0.6  0.1 - 1.0 K/uL   Eosinophils Relative 4  0 - 5 %   Eosinophils Absolute 0.2  0.0 - 0.7 K/uL   Basophils Relative 0  0 - 1 %   Basophils Absolute 0.0  0.0 - 0.1 K/uL  BASIC METABOLIC PANEL     Status: Abnormal   Collection Time    07/01/13  9:26 AM      Result Value Range   Sodium 140  135 - 145 mEq/L   Potassium 3.4 (*) 3.5 - 5.1 mEq/L   Chloride 103  96 - 112 mEq/L   CO2 22  19 - 32 mEq/L   Glucose, Bld 106 (*) 70 - 99 mg/dL   BUN 27 (*) 6 - 23 mg/dL   Creatinine, Ser 7.82  0.50 - 1.10 mg/dL   Calcium 9.4  8.4 - 95.6 mg/dL   GFR  calc non Af Amer 48 (*) >90 mL/min   GFR calc Af Amer 55 (*) >90 mL/min  URINALYSIS, ROUTINE W REFLEX MICROSCOPIC     Status: Abnormal   Collection Time    07/01/13  6:52 PM      Result Value Range   Color, Urine YELLOW  YELLOW   APPearance CLEAR  CLEAR   Specific Gravity, Urine 1.022  1.005 - 1.030   pH 5.5  5.0 - 8.0   Glucose, UA NEGATIVE  NEGATIVE mg/dL   Hgb urine dipstick NEGATIVE  NEGATIVE   Bilirubin Urine NEGATIVE  NEGATIVE   Ketones, ur 15 (*) NEGATIVE mg/dL   Protein, ur NEGATIVE  NEGATIVE mg/dL   Urobilinogen, UA 0.2  0.0 - 1.0 mg/dL   Nitrite NEGATIVE  NEGATIVE   Leukocytes, UA SMALL (*) NEGATIVE  URINE MICROSCOPIC-ADD ON     Status: Abnormal   Collection Time    07/01/13  6:52 PM      Result Value Range   Squamous Epithelial / LPF FEW (*) RARE   WBC, UA 0-2  <3 WBC/hpf   Bacteria, UA RARE  RARE    X-ray:  EXAM:   LEFT HIP - COMPLETE 2+ VIEW   COMPARISON: None  FINDINGS:   There is a displaced complete subcapital left femoral neck fracture.  No evidence for associated acute fractures. Multiple pelvic  phleboliths. Lower lumbar spine degenerative change. Right hip joint  degenerative change.   IMPRESSION:  Displaced complete subcapital left femoral neck fracture.   Electronically Signed   By: Annia Belt M.D   ROS: No recent hospitalizations No recent weight loss Long standing expressive aphasia No fever chills night sweats No cough No recent chest pains No abdominal pains, diarrhea or constipation  Blood pressure 136/63, pulse 68, temperature 97.5 F (36.4 C), temperature source Oral, resp. rate 20, height 5\' 5"  (1.651 m), weight 62.506 kg (137 lb 12.8 oz), SpO2 96.00%.  Exam: Comfortable in hospital bed Answers some questions  Left lower extremity, shortened and externally rotated Pain with movement NVI  No active cellulitis  No upper extremity or right lower extremity problems  Assessment/Plan: Displaced left femoral neck  fracture  NPO after midnight for hemiarthroplasty tomorrow Will try to transfer her to [facility] to be able to address her hip earlier in the day  Discussed all pertinent issues with her son - who will sign consent tomorrow Risk benefits/necessity of procedure understood  Minka Knight D 07/01/2013, 8:33 PM

## 2013-07-01 NOTE — Consult Note (Signed)
CARDIOLOGY CONSULT NOTE  Patient ID: [name], MRN: [id number], DOB/AGE: 77-May-1923 77 y.o. Admit date: 07/01/2013 Date of Consult: 07/01/2013  Primary Physician: Oliver Barre, MD Primary Cardiologist: Dr Ladona Ridgel Referring Physician: Dr Waymon Amato  Chief Complaint: Hip fracture Reason for Consultation: Preoperative CV evaluation  HPI: 79 y.o. female w/ PMHx significant for complete heart block s/p PPM insertion who presented to [facility] on 07/01/2013 with complaints of left hip pain after a mechanical fall. She has had episodes of atrial fibrillation, but has been felt a poor candidate for anticoagulation because of high bleeding risk with remote cerebral aneurysm, advanced age, and frequent falls.   She has no cardiac-related complaints. Specifically denies chest pain, chest pressure, shortness of breath, palpitations, lightheadedness, or syncope.  Reports that her fall was related to getting her legs tangled but did not have prodrome of dizziness or weakness.  Past Medical History  Diagnosis Date  . Aphasia 01/09/2009  . 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 CEREBROVASCULAR ACCIDENT, HX OF 11/20/2007  . CVA 01/09/2009  . DEMENTIA 05/19/2008  . DIVERTICULOSIS, COLON 11/20/2007  . FATIGUE 11/20/2007  . GLUCOSE INTOLERANCE 12/06/2010  . HYPERLIPIDEMIA 11/20/2007  . HYPERTENSION 11/20/2007    Echo was done 07/07/11: mod LVH, EF 55-60%, grade 1 diast dysfxn, mild MR, mild LAE, mod TR, PASP 39  . HYPOTHYROIDISM 11/20/2007  . IRON DEFICIENCY 12/06/2010  . PACEMAKER, PERMANENT 01/09/2009  . TIA 05/14/2003    elevated CE's in setting of TIA and falls in 10/12 - echo normal with no further cardiac w/u pursued  . Urinary incontinence 01/09/2009  . Impaired glucose tolerance 06/03/2011  . TIA (transient ischemic attack) 10/10/2011  . Dementia   . CHF (congestive heart failure)   . Coronary artery disease   . Arthritis     right hand  . Pacemaker 2002  . Femoral fracture 07/01/2013       Surgical History:  Past Surgical History  Procedure Laterality Date  . Medtronic cynthia dual chamber pacemaker serial number was [id number]...04/15/2009    . Abdominal hysterectomy    . S/p cerebral aneurysm  1991  . S/p pacemaker ddd    . Cholecystectomy    . S/p retinal attachment    . S/p thyroidectomy    . Cystocele repair N/A 11/26/2012    Procedure: Cystocele Repair with Lefort Colpocleisis.  colpectomy Levatorplasty Perineorraphy;  Surgeon: Lenoard Aden, MD;  Location: [facility];  Service: Gynecology;  Laterality: N/A;  Cystocele Repair with Lefort Colpocleisis.     Home Meds: Prior to Admission medications   Medication Sig Start Date End Date Taking? Authorizing Provider  amLODipine (NORVASC) 10 MG tablet Take 1 tablet (10 mg total) by mouth daily. 10/12/12  Yes Corwin Levins, MD  atorvastatin (LIPITOR) 10 MG tablet Take 1 tablet (10 mg total) by mouth daily. 10/12/12  Yes Corwin Levins, MD  Calcium Carbonate-Vitamin D (CALTRATE 600+D) 600-400 MG-UNIT per tablet Take 1 tablet by mouth daily.     Yes Historical Provider, MD  dipyridamole-aspirin (AGGRENOX) 200-25 MG per 12 hr capsule Take 1 capsule by mouth 2 (two) times daily. 10/12/12  Yes Corwin Levins, MD  donepezil (ARICEPT) 10 MG tablet Take 1 tablet (10 mg total) by mouth daily. 10/12/12  Yes Corwin Levins, MD  levothyroxine (SYNTHROID, LEVOTHROID) 125 MCG tablet Take 1 tablet (125 mcg total) by mouth daily. 10/12/12  Yes Corwin Levins, MD  Multiple Vitamin (MULTIVITAMIN WITH MINERALS) TABS Take 1 tablet  by mouth daily. Centrum silver   Yes Historical Provider, MD  telmisartan (MICARDIS) 80 MG tablet Take 1 tablet (80 mg total) by mouth daily. 11/20/12  Yes Corwin Levins, MD    Inpatient Medications:  . amLODipine  10 mg Oral Daily  . atorvastatin  10 mg Oral Daily  . calcium-vitamin D  1 tablet Oral Q breakfast  . donepezil  10 mg Oral Daily  . heparin  5,000 Units Subcutaneous Q8H  . irbesartan  300 mg Oral Daily  .  levothyroxine  125 mcg Oral QAC breakfast  . multivitamin with minerals  1 tablet Oral Daily  . sodium chloride  3 mL Intravenous Q12H   . 0.9 % NaCl with KCl 20 mEq / L 20 mL/hr at 07/01/13 1353    Allergies:  Allergies  Allergen Reactions  . Codeine     unknown    History   Social History  . Marital Status: Divorced    Spouse Name: N/A    Number of Children: N/A  . Years of Education: N/A   Occupational History  . retired Catering manager, lives at [employer]    Social History Main Topics  . Smoking status: Never Smoker   . Smokeless tobacco: Never Used  . Alcohol Use: No  . Drug Use: No  . Sexual Activity: Not on file   Other Topics Concern  . Not on file   Social History Narrative  . No narrative on file     Family History  Problem Relation Age of Onset  . Cancer Mother     breast cancer     Review of Systems: General: negative for chills, fever, night sweats or weight changes.  ENT: negative for rhinorrhea or epistaxis Cardiovascular: negative for chest pain, shortness of breath, dyspnea on exertion, edema, orthopnea, palpitations, or paroxysmal nocturnal dyspnea Dermatological: negative for rash Respiratory: negative for cough or wheezing GI: negative for nausea, vomiting, diarrhea, bright red blood per rectum, melena, or hematemesis GU: no hematuria, urgency, or frequency Neurologic: negative for visual changes, syncope, headache, or dizziness. Positive for expressive aphasia after a remote stroke Heme: no easy bruising or bleeding Endo: negative for excessive thirst, thyroid disorder, or flushing Musculoskeletal: positive for left hip pain All other systems reviewed and are otherwise negative except as noted above.  Physical Exam: Blood pressure 136/63, pulse 68, temperature 97.5 F (36.4 C), temperature source Oral, resp. rate 20, height 5\' 5"  (1.651 m), weight 137 lb 12.8 oz (62.506 kg), SpO2 96.00%. General: Well developed, pleasant elderly woman in  NAD, alert and oriented, expressive aphasia noted but able to understand her HEENT: Normocephalic, atraumatic, sclera non-icteric, no xanthomas, nares are without discharge.  Neck: Supple. Carotids 2+ without bruits. JVP normal Lungs: Clear bilaterally to auscultation without wheezes, rales, or rhonchi. Breathing is unlabored. Heart: RRR with normal S1 and S2. No murmurs, rubs, or gallops appreciated. Abdomen: Soft, non-tender, non-distended with normoactive bowel sounds. No hepatomegaly. No rebound/guarding. No obvious abdominal masses. Back: No CVA tenderness Msk:  Strength and tone appear normal for age. Extremities: No clubbing, cyanosis, or edema.  Distal pedal pulses are 2+ and equal bilaterally. Left leg externally rotated Neuro: CNII-XII intact, moves all extremities spontaneously. Psych:  Responds to questions appropriately with a normal affect.    Labs: No results found for this basename: CKTOTAL, CKMB, TROPONINI,  in the last 72 hours Lab Results  Component Value Date   WBC 6.1 07/01/2013   HGB 14.3 07/01/2013   HCT 40.5 07/01/2013  MCV 89.4 07/01/2013   PLT 254 07/01/2013    Recent Labs Lab 07/01/13 0926  NA 140  K 3.4*  CL 103  CO2 22  BUN 27*  CREATININE 1.02  CALCIUM 9.4  GLUCOSE 106*   Lab Results  Component Value Date   CHOL 161 01/08/2013   HDL 62.00 01/08/2013   LDLCALC 82 01/08/2013   TRIG 83.0 01/08/2013   No results found for this basename: DDIMER    Radiology/Studies:  Dg Chest 1 View  07/01/2013   CLINICAL DATA:  Status post fall.  EXAM: CHEST - 1 VIEW  COMPARISON:  Chest radiograph 10/26/2012  FINDINGS: Dual lead pacer apparatus overlies the left hemithorax, leads are stable in position. Stable cardiac and mediastinal contours. Calcification of the transverse thoracic aorta. Elevation of the right hemidiaphragm with mild eventration. No consolidative pulmonary opacities. No pleural effusion or pneumothorax. Regional skeleton is unremarkable.   IMPRESSION: No acute cardiopulmonary process.   Electronically Signed   By: Annia Belt M.D.   On: 07/01/2013 10:10   Dg Hip Complete Left  07/01/2013   CLINICAL DATA:  Status post fall. Left hip pain.  EXAM: LEFT HIP - COMPLETE 2+ VIEW  COMPARISON:  None  FINDINGS: There is a displaced complete subcapital left femoral neck fracture. No evidence for associated acute fractures. Multiple pelvic phleboliths. Lower lumbar spine degenerative change. Right hip joint degenerative change.  IMPRESSION: Displaced complete subcapital left femoral neck fracture.   Electronically Signed   By: Annia Belt M.D.   On: 07/01/2013 10:08    EKG: Electronic ventricular pacemaker  ASSESSMENT AND PLAN:  Complete AV block s/p PPM - normal pacemaker function in Feb 2014 when seen by Dr Ladona Ridgel.  Left hip fx - to go to the OR probably tomorrow for repair  Records reviewed - I cannot find echo but by Dr Lubertha Basque note an echo in 2012 showed normal LV function with LVEF 55-60%. She has no signs of heart failure, angina, or unstable arrhythmia. I do not see any cardiac contraindication to surgery. Her surgical risk will be increased by her advanced age and general frailty, but I don't think there is anything present that would prohibit surgery and I would recommend proceeding without cardiac testing. Will ask the pacemaker rep to check her PPM post-operatively. thx and please call if any questions.  Enzo Bi  07/01/2013, 5:25 PM

## 2013-07-01 NOTE — ED Notes (Signed)
Patient states fell in apartment this am, patient c/o left hip pain, patient with some shortening and outward rotation

## 2013-07-01 NOTE — ED Provider Notes (Signed)
CSN: [id number]     Arrival date & time 07/01/13  0905 History   First MD Initiated Contact with Patient 07/01/13 [phone]     Chief Complaint  Patient presents with  . Hip Pain    left   (Consider location/radiation/quality/duration/timing/severity/associated sxs/prior Treatment) Patient is a 79 y.o. female presenting with hip pain. The history is provided by the patient.  Hip Pain   patient here after tripping at home and injuring her left hip. Patient is unable to bear weight at this time. Denies any head injury. No chest or abdominal pain. No recent illnesses. Pain characterized as sharp and worse with movement. Denies any back pain. No perineal numbness or tingling. Called EMS and was transported here  Past Medical History  Diagnosis Date  . Aphasia 01/09/2009  . 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 CEREBROVASCULAR ACCIDENT, HX OF 11/20/2007  . CVA 01/09/2009  . DEMENTIA 05/19/2008  . DIVERTICULOSIS, COLON 11/20/2007  . FATIGUE 11/20/2007  . GLUCOSE INTOLERANCE 12/06/2010  . HYPERLIPIDEMIA 11/20/2007  . HYPERTENSION 11/20/2007    Echo was done 07/07/11: mod LVH, EF 55-60%, grade 1 diast dysfxn, mild MR, mild LAE, mod TR, PASP 39  . HYPOTHYROIDISM 11/20/2007  . IRON DEFICIENCY 12/06/2010  . PACEMAKER, PERMANENT 01/09/2009  . TIA 05/14/2003    elevated CE's in setting of TIA and falls in 10/12 - echo normal with no further cardiac w/u pursued  . Urinary incontinence 01/09/2009  . Impaired glucose tolerance 06/03/2011  . TIA (transient ischemic attack) 10/10/2011  . Dementia   . CHF (congestive heart failure)   . Coronary artery disease   . Arthritis     right hand  . Pacemaker 2002   Past Surgical History  Procedure Laterality Date  . Medtronic cynthia dual chamber pacemaker serial number was [id number]...04/15/2009    . Abdominal hysterectomy    . S/p cerebral aneurysm  1991  . S/p pacemaker ddd    . Cholecystectomy    . S/p retinal attachment    . S/p thyroidectomy    . Cystocele repair  N/A 11/26/2012    Procedure: Cystocele Repair with Lefort Colpocleisis.  colpectomy Levatorplasty Perineorraphy;  Surgeon: Lenoard Aden, MD;  Location: [facility];  Service: Gynecology;  Laterality: N/A;  Cystocele Repair with Lefort Colpocleisis.   Family History  Problem Relation Age of Onset  . Cancer Mother     breast cancer   History  Substance Use Topics  . Smoking status: Never Smoker   . Smokeless tobacco: Not on file  . Alcohol Use: No   OB History   Grav Para Term Preterm Abortions TAB SAB Ect Mult Living                 Review of Systems  All other systems reviewed and are negative.    Allergies  Codeine  Home Medications   Current Outpatient Rx  Name  Route  Sig  Dispense  Refill  . amLODipine (NORVASC) 10 MG tablet   Oral   Take 1 tablet (10 mg total) by mouth daily.   90 tablet   3   . atorvastatin (LIPITOR) 10 MG tablet   Oral   Take 1 tablet (10 mg total) by mouth daily.   90 tablet   3   . Calcium Carbonate-Vitamin D (CALTRATE 600+D) 600-400 MG-UNIT per tablet   Oral   Take 1 tablet by mouth daily.           Marland Kitchen dipyridamole-aspirin (AGGRENOX) 200-25 MG  per 12 hr capsule   Oral   Take 1 capsule by mouth 2 (two) times daily.   180 capsule   3   . donepezil (ARICEPT) 10 MG tablet   Oral   Take 1 tablet (10 mg total) by mouth daily.   90 tablet   3   . estradiol (ESTRACE) 0.1 MG/GM vaginal cream   Vaginal   Place vaginally daily. Apply two times per week         . folic acid-pyridoxine-cyancobalamin (FOLTX) 2.5-25-2 MG TABS   Oral   Take 1 tablet by mouth daily.   90 each   3   . glucose blood (FREESTYLE LITE) test strip      Use as instructed   100 each   12   . Lancets MISC   Does not apply   1 application by Does not apply route daily.   10 each   11   . levothyroxine (SYNTHROID, LEVOTHROID) 125 MCG tablet   Oral   Take 1 tablet (125 mcg total) by mouth daily.   90 tablet   3   . Multiple Vitamin (MULTIVITAMIN  WITH MINERALS) TABS   Oral   Take 1 tablet by mouth daily. Centrum silver         . telmisartan (MICARDIS) 80 MG tablet   Oral   Take 1 tablet (80 mg total) by mouth daily.   90 tablet   3   . traMADol (ULTRAM) 50 MG tablet   Oral   Take 1 tablet (50 mg total) by mouth every 6 (six) hours as needed for pain.   60 tablet   1   . traMADol (ULTRAM) 50 MG tablet   Oral   Take 1 tablet (50 mg total) by mouth every 6 (six) hours as needed.   30 tablet   0    BP 146/73  Temp(Src) 97.5 F (36.4 C) (Oral)  Resp 22  SpO2 97% Physical Exam  Nursing note and vitals reviewed. Constitutional: She is oriented to person, place, and time. She appears well-developed and well-nourished.  Non-toxic appearance. No distress.  HENT:  Head: Normocephalic and atraumatic.  Eyes: Conjunctivae, EOM and lids are normal. Pupils are equal, round, and reactive to light.  Neck: Normal range of motion. Neck supple. No tracheal deviation present. No mass present.  Cardiovascular: Normal rate, regular rhythm and normal heart sounds.  Exam reveals no gallop.   No murmur heard. Pulmonary/Chest: Effort normal and breath sounds normal. No stridor. No respiratory distress. She has no decreased breath sounds. She has no wheezes. She has no rhonchi. She has no rales.  Abdominal: Soft. Normal appearance and bowel sounds are normal. She exhibits no distension. There is no tenderness. There is no rebound and no CVA tenderness.  Musculoskeletal: Normal range of motion. She exhibits no edema and no tenderness.  Left hip is shortened and internally rotated. Neurovascular status is intact. Nontender at the left ankle and knee.  Neurological: She is alert and oriented to person, place, and time. She has normal strength. No cranial nerve deficit or sensory deficit. GCS eye subscore is 4. GCS verbal subscore is 5. GCS motor subscore is 6.  Skin: Skin is warm and dry. No abrasion and no rash noted.  Psychiatric: She has a  normal mood and affect. Her speech is normal and behavior is normal.    ED Course  Procedures (including critical care time) Labs Review Labs Reviewed  CBC WITH DIFFERENTIAL  BASIC METABOLIC  PANEL   Imaging Review No results found.  MDM  No diagnosis found. Patient given morphine for pain. X-ray consistent with left femoral neck fracture. Spoke with Dr. Lajoyce Corners from orthopedic surgery he will patient to the OR tomorrow. Will Consult triad hospitalist for admission    Toy Baker, MD 07/01/13 1046

## 2013-07-01 NOTE — Progress Notes (Signed)
Patient arrived to room via stretcher from ED. Pt grimacing from pain in left hip but appears to be in no distress. Patient has no other complaints. CCMD notified of patients arrival. Will monitor patient.

## 2013-07-01 NOTE — Progress Notes (Signed)
Ice applied to patient's left hip

## 2013-07-01 NOTE — H&P (Addendum)
[facility] History and Physical  [name]:[id number] DOB: 1921-12-04 DOA: 07/01/2013  Referring physician: Dr. Lorre Nick, EDP PCP: Oliver Barre, MD  Outpatient Specialists:  1. Cardiology: Dr. Lewayne Bunting. 2. Neurology: Dr. Delia Heady.  Chief Complaint: Fall and left hip pain  HPI: [name] is a 79 y.o. female with extensive past medical history-recurrent CVA with residual dysarthria and expressive aphasia, on Aggrenox which she takes 2 tabs daily in the morning, AV block-status post permanent pacemaker, dementia, hyperlipidemia, hypertension, hypothyroid, chronic ? diastolic CHF, CAD presented to the ED on 07/01/13 with left hip pain following fall. Patient is a resident of [facility] independent [facility]. History was provided by patient and her son/health care power of attorney Mr. [name] who is at the bedside. Sometime this morning, patient was watching TV and trying to fix her some breakfast and coffee and she turned. She states that her home footwear may have gotten entangled on the carpet and she sustained a fall landing on her left side. She immediately experienced excruciating left hip pain which was made worse with movement and patient was unable to get up. She said she mildly hit her head but did not lose consciousness. There was no bleeding. She denies chest pain, dyspnea, palpitations, dizziness or lightheadedness at any time. Normally she ambulates independently in her apartment but does use her walker outside. In the ED, vital signs are stable, potassium 3.4, chest x-ray negative for acute cardiopulmonary process and left hip x-ray shows displaced complete subcapital left femoral neck fracture. Hospitalist admission has been requested.   Review of Systems: All systems reviewed and apart from history of presenting illness, are negative  Past Medical History  Diagnosis Date  . Aphasia 01/09/2009  . 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 CEREBROVASCULAR  ACCIDENT, HX OF 11/20/2007  . CVA 01/09/2009  . DEMENTIA 05/19/2008  . DIVERTICULOSIS, COLON 11/20/2007  . FATIGUE 11/20/2007  . GLUCOSE INTOLERANCE 12/06/2010  . HYPERLIPIDEMIA 11/20/2007  . HYPERTENSION 11/20/2007    Echo was done 07/07/11: mod LVH, EF 55-60%, grade 1 diast dysfxn, mild MR, mild LAE, mod TR, PASP 39  . HYPOTHYROIDISM 11/20/2007  . IRON DEFICIENCY 12/06/2010  . PACEMAKER, PERMANENT 01/09/2009  . TIA 05/14/2003    elevated CE's in setting of TIA and falls in 10/12 - echo normal with no further cardiac w/u pursued  . Urinary incontinence 01/09/2009  . Impaired glucose tolerance 06/03/2011  . TIA (transient ischemic attack) 10/10/2011  . Dementia   . CHF (congestive heart failure)   . Coronary artery disease   . Arthritis     right hand  . Pacemaker 2002   Past Surgical History  Procedure Laterality Date  . Medtronic cynthia dual chamber pacemaker serial number was [id number]...04/15/2009    . Abdominal hysterectomy    . S/p cerebral aneurysm  1991  . S/p pacemaker ddd    . Cholecystectomy    . S/p retinal attachment    . S/p thyroidectomy    . Cystocele repair N/A 11/26/2012    Procedure: Cystocele Repair with Lefort Colpocleisis.  colpectomy Levatorplasty Perineorraphy;  Surgeon: Lenoard Aden, MD;  Location: [facility];  Service: Gynecology;  Laterality: N/A;  Cystocele Repair with Lefort Colpocleisis.   Social History:  reports that she has never smoked. She does not have any smokeless tobacco history on file. She reports that she does not drink alcohol or use illicit drugs. Widowed. Resident of independent living. Uses walker.  Allergies  Allergen Reactions  .  Codeine     unknown    Family History  Problem Relation Age of Onset  . Cancer Mother     breast cancer    Prior to Admission medications   Medication Sig Start Date End Date Taking? Authorizing Provider  amLODipine (NORVASC) 10 MG tablet Take 1 tablet (10 mg total) by mouth daily. 10/12/12  Yes Corwin Levins,  MD  atorvastatin (LIPITOR) 10 MG tablet Take 1 tablet (10 mg total) by mouth daily. 10/12/12  Yes Corwin Levins, MD  Calcium Carbonate-Vitamin D (CALTRATE 600+D) 600-400 MG-UNIT per tablet Take 1 tablet by mouth daily.     Yes Historical Provider, MD  dipyridamole-aspirin (AGGRENOX) 200-25 MG per 12 hr capsule Take 1 capsule by mouth 2 (two) times daily. 10/12/12  Yes Corwin Levins, MD  donepezil (ARICEPT) 10 MG tablet Take 1 tablet (10 mg total) by mouth daily. 10/12/12  Yes Corwin Levins, MD  levothyroxine (SYNTHROID, LEVOTHROID) 125 MCG tablet Take 1 tablet (125 mcg total) by mouth daily. 10/12/12  Yes Corwin Levins, MD  Multiple Vitamin (MULTIVITAMIN WITH MINERALS) TABS Take 1 tablet by mouth daily. Centrum silver   Yes Historical Provider, MD  telmisartan (MICARDIS) 80 MG tablet Take 1 tablet (80 mg total) by mouth daily. 11/20/12  Yes Corwin Levins, MD   Physical Exam: Filed Vitals:   07/01/13 0907 07/01/13 1151  BP: 146/73 138/71  Temp: 97.5 F (36.4 C)   TempSrc: Oral   Resp: 22 20  SpO2: 97% 96%     General exam: Moderately built and nourished female patient, lying comfortably supine on the gurney in no obvious distress.  Head, eyes and ENT: Nontraumatic and normocephalic. Pupils equally reacting to light and accommodation. Oral mucosa moist.  Neck: Supple. No JVD, carotid bruit or thyromegaly.  Lymphatics: No lymphadenopathy.  Respiratory system: Clear to auscultation. No increased work of breathing. Pacemaker box left upper chest.  Cardiovascular system: S1 and S2 heard, RRR. No JVD, murmurs, gallops, clicks or pedal edema.  Gastrointestinal system: Abdomen is nondistended, soft and nontender. Normal bowel sounds heard. No organomegaly or masses appreciated.  Central nervous system: Alert and oriented. Chronic dysarthria and some expressive aphasia-unchanged as per son.  Extremities: Symmetric 5 x 5 power in upper extremities and right lower extremity. Left lower extremity is  shortened and externally rotated and painful movements. Peripheral pulses symmetrically felt.  Skin: No rashes or acute findings.  Musculoskeletal system: Negative exam.  Psychiatry: Pleasant and cooperative.   Labs on Admission:  Basic Metabolic Panel:  Recent Labs Lab 07/01/13 0926  NA 140  K 3.4*  CL 103  CO2 22  GLUCOSE 106*  BUN 27*  CREATININE 1.02  CALCIUM 9.4   Liver Function Tests: No results found for this basename: AST, ALT, ALKPHOS, BILITOT, PROT, ALBUMIN,  in the last 168 hours No results found for this basename: LIPASE, AMYLASE,  in the last 168 hours No results found for this basename: AMMONIA,  in the last 168 hours CBC:  Recent Labs Lab 07/01/13 0926  WBC 6.1  NEUTROABS 3.7  HGB 14.3  HCT 40.5  MCV 89.4  PLT 254   Cardiac Enzymes: No results found for this basename: CKTOTAL, CKMB, CKMBINDEX, TROPONINI,  in the last 168 hours  BNP (last 3 results) No results found for this basename: PROBNP,  in the last 8760 hours CBG: No results found for this basename: GLUCAP,  in the last 168 hours  Radiological Exams on Admission: Dg  Chest 1 View  07/01/2013   CLINICAL DATA:  Status post fall.  EXAM: CHEST - 1 VIEW  COMPARISON:  Chest radiograph 10/26/2012  FINDINGS: Dual lead pacer apparatus overlies the left hemithorax, leads are stable in position. Stable cardiac and mediastinal contours. Calcification of the transverse thoracic aorta. Elevation of the right hemidiaphragm with mild eventration. No consolidative pulmonary opacities. No pleural effusion or pneumothorax. Regional skeleton is unremarkable.  IMPRESSION: No acute cardiopulmonary process.   Electronically Signed   By: Annia Belt M.D.   On: 07/01/2013 10:10   Dg Hip Complete Left  07/01/2013   CLINICAL DATA:  Status post fall. Left hip pain.  EXAM: LEFT HIP - COMPLETE 2+ VIEW  COMPARISON:  None  FINDINGS: There is a displaced complete subcapital left femoral neck fracture. No evidence for  associated acute fractures. Multiple pelvic phleboliths. Lower lumbar spine degenerative change. Right hip joint degenerative change.  IMPRESSION: Displaced complete subcapital left femoral neck fracture.   Electronically Signed   By: Annia Belt M.D.   On: 07/01/2013 10:08    EKG: Independently reviewed. Paced rhythm and unchanged from EKG of 08/30/12  Assessment/Plan Principal Problem:   Closed fracture of left hip Active Problems:   HYPOTHYROIDISM   HYPERLIPIDEMIA   DEMENTIA   HYPERTENSION   AV BLOCK, COMPLETE   Aphasia   CEREBROVASCULAR ACCIDENT, HX OF   PACEMAKER, PERMANENT   Hypokalemia   Atrial fibrillation  Closed left hip fracture/displaced complete subcapital left femoral neck fracture/mechanical fall - Orthopedics: Dr. Durene Romans consulted - Dr. Charlann Boxer advises possible surgery 10/7 so effect of Aggrenox (last dose 10/5 AM) can wear off. Aggrenox held. - Patient is at high risk for perioperative CV events but all her chronic conditions are medically optimized and hence she may proceed to surgery without any further CV workup. She will need close monitoring perioperatively and maintain ins and outs even. Have requested [facility] Cardiology consult for any further recommendations. - Patient's son is keen to find out if the procedure can be done under spinal/epidural anesthesia. - Pain management  Hypokalemia - Replete and follow BMP in a.m.  Status post permanent pacemaker for complete heart block/A. fib/CAD/chronic CHF-? Diastolic - Compensated. - Continue home medications and monitor on telemetry. - Not on anticoagulation: Possibly from fall risk.  History of recurrent CVA with residual dysarthria and expressive aphasia/dementia - Hold Aggrenox for surgery.  Hypertension - Controlled  Hypothyroidism - Continue Synthroid    Code Status: Full  Family Communication: And discussed with patient's son/health care power of attorney Mr. Madden Piazza (cell phone number :  [phone]) at bedside.  Disposition Plan: Possibly SNF when medically stable.   Time spent: 70 minutes  [facility] Pager [phone]  If 7PM-7AM, please contact night-coverage [email or web address] Password [facility] 07/01/2013, 12:27 PM

## 2013-07-02 ENCOUNTER — Encounter (HOSPITAL_COMMUNITY): Payer: Self-pay | Admitting: Registered Nurse

## 2013-07-02 ENCOUNTER — Encounter (HOSPITAL_COMMUNITY)
Admission: EM | Disposition: A | Discharge status: Home / Self Care | Payer: Self-pay | Source: Home / Self Care | Attending: Internal Medicine

## 2013-07-02 ENCOUNTER — Inpatient Hospital Stay (HOSPITAL_COMMUNITY): Payer: Medicare Other

## 2013-07-02 ENCOUNTER — Inpatient Hospital Stay (HOSPITAL_COMMUNITY): Payer: Medicare Other | Admitting: Registered Nurse

## 2013-07-02 DIAGNOSIS — I442 Atrioventricular block, complete: Secondary | ICD-10-CM | POA: Diagnosis not present

## 2013-07-02 DIAGNOSIS — S72009A Fracture of unspecified part of neck of unspecified femur, initial encounter for closed fracture: Secondary | ICD-10-CM | POA: Diagnosis not present

## 2013-07-02 DIAGNOSIS — Z96649 Presence of unspecified artificial hip joint: Secondary | ICD-10-CM | POA: Diagnosis not present

## 2013-07-02 DIAGNOSIS — F0390 Unspecified dementia without behavioral disturbance: Secondary | ICD-10-CM | POA: Diagnosis not present

## 2013-07-02 DIAGNOSIS — I4891 Unspecified atrial fibrillation: Secondary | ICD-10-CM | POA: Diagnosis not present

## 2013-07-02 DIAGNOSIS — Z471 Aftercare following joint replacement surgery: Secondary | ICD-10-CM | POA: Diagnosis not present

## 2013-07-02 DIAGNOSIS — I10 Essential (primary) hypertension: Secondary | ICD-10-CM | POA: Diagnosis not present

## 2013-07-02 DIAGNOSIS — I5032 Chronic diastolic (congestive) heart failure: Secondary | ICD-10-CM | POA: Diagnosis not present

## 2013-07-02 DIAGNOSIS — I959 Hypotension, unspecified: Secondary | ICD-10-CM | POA: Diagnosis not present

## 2013-07-02 DIAGNOSIS — S72033A Displaced midcervical fracture of unspecified femur, initial encounter for closed fracture: Secondary | ICD-10-CM | POA: Diagnosis not present

## 2013-07-02 DIAGNOSIS — I509 Heart failure, unspecified: Secondary | ICD-10-CM | POA: Diagnosis not present

## 2013-07-02 HISTORY — PX: HIP ARTHROPLASTY: SHX981

## 2013-07-02 LAB — CBC
HCT: 36.1 % (ref 36.0–46.0)
Hemoglobin: 12.6 g/dL (ref 12.0–15.0)
MCH: 31.5 pg (ref 26.0–34.0)
MCHC: 34.9 g/dL (ref 30.0–36.0)
MCV: 90.3 fL (ref 78.0–100.0)
Platelets: 223 K/uL (ref 150–400)
RBC: 4 MIL/uL (ref 3.87–5.11)
RDW: 12.9 % (ref 11.5–15.5)
WBC: 9.2 K/uL (ref 4.0–10.5)

## 2013-07-02 LAB — BASIC METABOLIC PANEL
Calcium: 8.7 mg/dL (ref 8.4–10.5)
Creatinine, Ser: 0.75 mg/dL (ref 0.50–1.10)
GFR calc non Af Amer: 73 mL/min — ABNORMAL LOW (ref >90)
Potassium: 3.4 mEq/L — ABNORMAL LOW (ref 3.5–5.1)
Sodium: 137 mEq/L (ref 135–145)

## 2013-07-02 LAB — SURGICAL PCR SCREEN
MRSA, PCR: NEGATIVE
Staphylococcus aureus: POSITIVE — AB

## 2013-07-02 SURGERY — HEMIARTHROPLASTY, HIP, DIRECT ANTERIOR APPROACH, FOR FRACTURE
Anesthesia: Spinal | Site: Hip | Laterality: Left | Wound class: Clean

## 2013-07-02 MED ORDER — FLEET ENEMA 7-19 GM/118ML RE ENEM: 1.0000 | ENEMA | Freq: Once | RECTAL | Status: AC | PRN

## 2013-07-02 MED ORDER — LACTATED RINGERS IV SOLN
INTRAVENOUS | Status: DC
  Administered 2013-07-02: 1 via INTRAVENOUS
  Administered 2013-07-02: 1000 mL via INTRAVENOUS

## 2013-07-02 MED ORDER — CEFAZOLIN SODIUM-DEXTROSE 2-3 GM-% IV SOLR
INTRAVENOUS | Status: AC
  Filled 2013-07-02: qty 50

## 2013-07-02 MED ORDER — EPHEDRINE SULFATE 50 MG/ML IJ SOLN
INTRAMUSCULAR | Status: DC | PRN
  Administered 2013-07-02 (×2): 5 mg via INTRAVENOUS

## 2013-07-02 MED ORDER — MENTHOL 3 MG MT LOZG: 1.0000 | LOZENGE | OROMUCOSAL | Status: DC | PRN

## 2013-07-02 MED ORDER — ONDANSETRON HCL 4 MG/2ML IJ SOLN
INTRAMUSCULAR | Status: DC | PRN
  Administered 2013-07-02: 4 mg via INTRAMUSCULAR

## 2013-07-02 MED ORDER — ALUM & MAG HYDROXIDE-SIMETH 200-200-20 MG/5ML PO SUSP: 30.0000 mL | ORAL | Status: DC | PRN

## 2013-07-02 MED ORDER — 0.9 % SODIUM CHLORIDE (POUR BTL) OPTIME
TOPICAL | Status: DC | PRN
  Administered 2013-07-02: 1000 mL

## 2013-07-02 MED ORDER — METHOCARBAMOL 500 MG PO TABS
500.0000 mg | ORAL_TABLET | Freq: Four times a day (QID) | ORAL | Status: DC | PRN
  Administered 2013-07-03 (×2): 500 mg via ORAL
  Filled 2013-07-02 (×2): qty 1

## 2013-07-02 MED ORDER — BISACODYL 10 MG RE SUPP: 10.0000 mg | Freq: Every day | RECTAL | Status: DC | PRN

## 2013-07-02 MED ORDER — METHOCARBAMOL 100 MG/ML IJ SOLN: 500.0000 mg | Freq: Four times a day (QID) | INTRAVENOUS | Status: DC | PRN

## 2013-07-02 MED ORDER — METOCLOPRAMIDE HCL 5 MG/ML IJ SOLN: 5.0000 mg | Freq: Three times a day (TID) | INTRAMUSCULAR | Status: DC | PRN

## 2013-07-02 MED ORDER — FERROUS SULFATE 325 (65 FE) MG PO TABS
325.0000 mg | ORAL_TABLET | Freq: Three times a day (TID) | ORAL | Status: DC
  Administered 2013-07-02 – 2013-07-05 (×8): 325 mg via ORAL
  Filled 2013-07-02 (×12): qty 1

## 2013-07-02 MED ORDER — FENTANYL CITRATE 0.05 MG/ML IJ SOLN
INTRAMUSCULAR | Status: DC | PRN
  Administered 2013-07-02 (×2): 25 ug via INTRAVENOUS

## 2013-07-02 MED ORDER — PHENYLEPHRINE HCL 10 MG/ML IJ SOLN
INTRAMUSCULAR | Status: DC | PRN
  Administered 2013-07-02: 40 ug via INTRAVENOUS
  Administered 2013-07-02: 80 ug via INTRAVENOUS
  Administered 2013-07-02: 40 ug via INTRAVENOUS

## 2013-07-02 MED ORDER — CEFAZOLIN SODIUM-DEXTROSE 2-3 GM-% IV SOLR
2.0000 g | Freq: Four times a day (QID) | INTRAVENOUS | Status: AC
  Administered 2013-07-02 – 2013-07-03 (×2): 2 g via INTRAVENOUS
  Filled 2013-07-02 (×2): qty 50

## 2013-07-02 MED ORDER — DOCUSATE SODIUM 100 MG PO CAPS
100.0000 mg | ORAL_CAPSULE | Freq: Two times a day (BID) | ORAL | Status: DC
Start: 2013-07-02 — End: 2013-07-05
  Administered 2013-07-02 – 2013-07-05 (×6): 100 mg via ORAL
  Filled 2013-07-02 (×8): qty 1

## 2013-07-02 MED ORDER — FENTANYL CITRATE 0.05 MG/ML IJ SOLN: 25.0000 ug | INTRAMUSCULAR | Status: DC | PRN

## 2013-07-02 MED ORDER — STERILE WATER FOR IRRIGATION IR SOLN
Status: DC | PRN
  Administered 2013-07-02: 1500 mL

## 2013-07-02 MED ORDER — MIDAZOLAM HCL 5 MG/5ML IJ SOLN
INTRAMUSCULAR | Status: DC | PRN
  Administered 2013-07-02: 1 mg via INTRAVENOUS

## 2013-07-02 MED ORDER — POTASSIUM CHLORIDE CRYS ER 20 MEQ PO TBCR
40.0000 meq | EXTENDED_RELEASE_TABLET | Freq: Two times a day (BID) | ORAL | Status: AC
  Administered 2013-07-02 (×2): 40 meq via ORAL
  Filled 2013-07-02 (×3): qty 2

## 2013-07-02 MED ORDER — POLYETHYLENE GLYCOL 3350 17 G PO PACK
17.0000 g | PACK | Freq: Every day | ORAL | Status: DC | PRN
  Filled 2013-07-02: qty 1

## 2013-07-02 MED ORDER — PROPOFOL INFUSION 10 MG/ML OPTIME
INTRAVENOUS | Status: DC | PRN
  Administered 2013-07-02: 50 ug/kg/min via INTRAVENOUS

## 2013-07-02 MED ORDER — BUPIVACAINE IN DEXTROSE 0.75-8.25 % IT SOLN
INTRATHECAL | Status: DC | PRN
  Administered 2013-07-02: 2 mL via INTRATHECAL

## 2013-07-02 MED ORDER — MORPHINE SULFATE 2 MG/ML IJ SOLN
2.0000 mg | INTRAMUSCULAR | Status: DC | PRN
  Administered 2013-07-02 – 2013-07-03 (×3): 2 mg via INTRAVENOUS
  Filled 2013-07-02 (×3): qty 1

## 2013-07-02 MED ORDER — METOCLOPRAMIDE HCL 10 MG PO TABS: 5.0000 mg | ORAL_TABLET | Freq: Three times a day (TID) | ORAL | Status: DC | PRN

## 2013-07-02 MED ORDER — KETAMINE HCL 10 MG/ML IJ SOLN
INTRAMUSCULAR | Status: DC | PRN
  Administered 2013-07-02: 20 mg via INTRAVENOUS

## 2013-07-02 MED ORDER — TRAMADOL HCL 50 MG PO TABS
50.0000 mg | ORAL_TABLET | Freq: Four times a day (QID) | ORAL | Status: DC | PRN
  Administered 2013-07-03: 100 mg via ORAL
  Administered 2013-07-03 – 2013-07-05 (×4): 50 mg via ORAL
  Filled 2013-07-02 (×3): qty 1
  Filled 2013-07-02: qty 2
  Filled 2013-07-02: qty 1

## 2013-07-02 MED ORDER — CEFAZOLIN SODIUM-DEXTROSE 2-3 GM-% IV SOLR
INTRAVENOUS | Status: DC | PRN
  Administered 2013-07-02: 2 g via INTRAVENOUS

## 2013-07-02 MED ORDER — PHENOL 1.4 % MT LIQD: 1.0000 | OROMUCOSAL | Status: DC | PRN

## 2013-07-02 SURGICAL SUPPLY — 48 items
BAG ZIPLOCK 12X15 (MISCELLANEOUS) ×2 IMPLANT
BLADE SAW SGTL 18X1.27X75 (BLADE) ×2 IMPLANT
CAPT HIP FX BIPOLAR/UNIPOLAR ×2 IMPLANT
CLOTH BEACON ORANGE TIMEOUT ST (SAFETY) ×2 IMPLANT
DERMABOND ADVANCED (GAUZE/BANDAGES/DRESSINGS) ×1
DERMABOND ADVANCED .7 DNX12 (GAUZE/BANDAGES/DRESSINGS) ×1 IMPLANT
DRAPE INCISE IOBAN 85X60 (DRAPES) ×2 IMPLANT
DRAPE ORTHO SPLIT 77X108 STRL (DRAPES) ×2
DRAPE POUCH INSTRU U-SHP 10X18 (DRAPES) ×2 IMPLANT
DRAPE SURG 17X11 SM STRL (DRAPES) ×2 IMPLANT
DRAPE SURG ORHT 6 SPLT 77X108 (DRAPES) ×2 IMPLANT
DRAPE U-SHAPE 47X51 STRL (DRAPES) ×2 IMPLANT
DRSG AQUACEL AG ADV 3.5X10 (GAUZE/BANDAGES/DRESSINGS) ×2 IMPLANT
DRSG AQUACEL AG ADV 3.5X14 (GAUZE/BANDAGES/DRESSINGS) ×2 IMPLANT
DRSG TEGADERM 4X4.75 (GAUZE/BANDAGES/DRESSINGS) ×2 IMPLANT
DURAPREP 26ML APPLICATOR (WOUND CARE) ×2 IMPLANT
ELECT BLADE TIP CTD 4 INCH (ELECTRODE) ×2 IMPLANT
ELECT REM PT RETURN 9FT ADLT (ELECTROSURGICAL) ×2
ELECTRODE REM PT RTRN 9FT ADLT (ELECTROSURGICAL) ×1 IMPLANT
EVACUATOR 1/8 PVC DRAIN (DRAIN) IMPLANT
FACESHIELD LNG OPTICON STERILE (SAFETY) ×8 IMPLANT
GAUZE SPONGE 2X2 8PLY STRL LF (GAUZE/BANDAGES/DRESSINGS) ×1 IMPLANT
GLOVE BIOGEL PI IND STRL 7.5 (GLOVE) ×1 IMPLANT
GLOVE BIOGEL PI IND STRL 8 (GLOVE) ×1 IMPLANT
GLOVE BIOGEL PI INDICATOR 7.5 (GLOVE) ×1
GLOVE BIOGEL PI INDICATOR 8 (GLOVE) ×1
GLOVE ECLIPSE 8.0 STRL XLNG CF (GLOVE) IMPLANT
GLOVE ORTHO TXT STRL SZ7.5 (GLOVE) ×4 IMPLANT
GLOVE SURG ORTHO 8.0 STRL STRW (GLOVE) ×2 IMPLANT
GOWN BRE IMP PREV XXLGXLNG (GOWN DISPOSABLE) ×4 IMPLANT
GOWN PREVENTION PLUS LG XLONG (DISPOSABLE) ×2 IMPLANT
HANDPIECE INTERPULSE COAX TIP (DISPOSABLE)
IMMOBILIZER KNEE 20 (SOFTGOODS)
IMMOBILIZER KNEE 20 THIGH 36 (SOFTGOODS) IMPLANT
KIT BASIN OR (CUSTOM PROCEDURE TRAY) ×2 IMPLANT
MANIFOLD NEPTUNE II (INSTRUMENTS) ×2 IMPLANT
PACK TOTAL JOINT (CUSTOM PROCEDURE TRAY) ×2 IMPLANT
POSITIONER SURGICAL ARM (MISCELLANEOUS) ×2 IMPLANT
SET HNDPC FAN SPRY TIP SCT (DISPOSABLE) IMPLANT
SPONGE GAUZE 2X2 STER 10/PKG (GAUZE/BANDAGES/DRESSINGS) ×1
STRIP CLOSURE SKIN 1/2X4 (GAUZE/BANDAGES/DRESSINGS) ×4 IMPLANT
SUT ETHIBOND NAB CT1 #1 30IN (SUTURE) ×2 IMPLANT
SUT MNCRL AB 4-0 PS2 18 (SUTURE) ×2 IMPLANT
SUT VIC AB 1 CT1 36 (SUTURE) ×6 IMPLANT
SUT VIC AB 2-0 CT1 27 (SUTURE) ×2
SUT VIC AB 2-0 CT1 TAPERPNT 27 (SUTURE) ×2 IMPLANT
TOWEL OR 17X26 10 PK STRL BLUE (TOWEL DISPOSABLE) ×4 IMPLANT
TRAY FOLEY CATH 14FRSI W/METER (CATHETERS) ×2 IMPLANT

## 2013-07-02 NOTE — Progress Notes (Signed)
PT Cancellation Note  Patient Details Name: [name] MRN: [id number] DOB: 1922-08-19   Cancelled Treatment:    Reason Eval/Treat Not Completed: Other (comment) (pt with hip fx awaiting sx. Will sign off until postop reorder)   [name] 07/02/2013, 12:05 PM [name], PT [phone]

## 2013-07-02 NOTE — Anesthesia Procedure Notes (Signed)
Spinal  Patient location during procedure: OR Staffing Anesthesiologist: Azell Der Performed by: anesthesiologist  Preanesthetic Checklist Completed: patient identified, site marked, surgical consent, pre-op evaluation, timeout performed, IV checked, risks and benefits discussed and monitors and equipment checked Spinal Block Patient position: sitting Prep: Betadine Patient monitoring: heart rate, continuous pulse ox and blood pressure Approach: left paramedian Location: L3-4 Injection technique: single-shot Needle Needle type: Sprotte  Needle gauge: 24 G Needle length: 9 cm Additional Notes Expiration date of kit checked and confirmed. Patient tolerated procedure well, without complications. CSF clear. No paresthesia.

## 2013-07-02 NOTE — Progress Notes (Signed)
07/01/13 Pt.A/Ox4 and had c/o hip pain throughout the shift prn pain medication was given. Orthopedic surgeon Dr.Olin spoke with patient last night and also spoke with patient's family over the phone. Order was placed for informed consent. Patient signed consent and it was placed in chart. She has had no signs of distress and is on room air.

## 2013-07-02 NOTE — Progress Notes (Signed)
Consent was signed prior to transport for procedure, but son stated he had not spoken with Dr. Charlann Boxer and did not want the consent signed until he spoke with Dr. Charlann Boxer. Previous consent removed from chart and shredded. Blood consent signed by son, stated he was agreeable to sign blood consent. Jewelry removed and given to family at bedside prior to transport to [facility].

## 2013-07-02 NOTE — Progress Notes (Signed)
Patient's son reported that patient coughed up "gold sputum this morning." Patient has not produced any sputum at this time but will continue to monitor.

## 2013-07-02 NOTE — Op Note (Signed)
NAME:  [name], [name] NO.:  [id number]   MEDICAL RECORD NO.: [id number]   LOCATION:  1435                         FACILITY:  [facility]   DATE OF BIRTH:  1922/08/12  PHYSICIAN:  Madlyn Frankel. Charlann Boxer, M.D.     DATE OF PROCEDURE:  07/02/2013                               OPERATIVE REPORT     PREOPERATIVE DIAGNOSIS:  Left displaced femoral neck fracture.   POSTOPERATIVE DIAGNOSIS:  Left displaced femoral neck fracture.   PROCEDURE:  Left hip hemiarthroplasty utilizing DePuy component, size 3 standard Summit Basic stem with a 48mm unipolar ball with a +0 adapter.   SURGEON:  Madlyn Frankel. Charlann Boxer, MD   ASSISTANT:  Lanney Gins, PA-C.   ANESTHESIA:  General.   SPECIMENS:  None.   DRAINS:  None.   BLOOD LOSS:  About 100 cc.   COMPLICATIONS:  None.   INDICATION OF PROCEDURE:  [name] is a pleasant 78 yo female who reside in independent living at [facility].  She unfortunately had a ground level fall with immediate pain and inability to bear weight.  She was brought initially to [facility] ER where radiographs reveal a displaced left femoral neck fracture.  She was seen and admitted to the Hospitalist service and I was consulted to help address the hip.  She was seen and evaluated, radiographs reviewed and was scheduled for surgery for Hip hemiarthroplasty.  The necessity of surgical repair was discussed with she and her son, HCPOA.  Consent was obtained after reviewing risks of infection, DVT, component failure, and need for revision surgery.   PROCEDURE IN DETAIL:  The patient was brought to the operative theater. Once adequate anesthesia, preoperative antibiotics, 2 g of Ancef administered, the patient was positioned into the right lateral decubitus position with the left side up.  The left lower extremity was then prepped and draped in sterile fashion.  A time-out was performed identifying the patient, planned procedure, and extremity.   A lateral incision was made  off the proximal trochanter. Sharp dissection was carried down to the iliotibial band and gluteal fascia. The gluteal fascia was then incised for posterior approach.  The short external rotators were taken down separate from the posterior capsule. An L capsulotomy was made preserving the posterior leaflet for later anatomic repair. Fracture site was identified and after removing comminuted segments of the posterior femoral neck, the femoral head was removed without difficulty and measured on the back table  using the sizing rings and determined to be 48 mm in diameter.   The proximal femur was then exposed.  Retractors placed.  I then drilled, opened the proximal femur.  Then I hand reamed once and  Irrigated the canal to try to prevent fat emboli.  I began broaching the femur with a starting size 1 broach up to a size 3 broach with good medial and lateral metaphyseal fit without evidence of any torsion or movement.  A trial reduction was carried out with a standard neck and a +0 adapter with a 48mm trial ball.  The hip reduced nicely.  The leg lengths appeared to be equal compared to the down leg.   The hip  went through a range of motion without evidence of any subluxation or impingement.   Given these findings, the trial components removed.  The final 3 standard Summit Basic stem was opened.  After irrigating the canal, the final stem was impacted and sat at the level where the broach was on the calcar. Based on this and the trial reduction, a +0 adapter was opened and impacted in the 48mm unipolar ball onto a clean and dry trunnion.  The hip had been irrigated throughout the case and again at this point.  I re- Approximated the posterior capsule to the superior leaflet using a  #1 Vicryl.  The remainder of the wound was closed with #1 Vicryl in the iliotibial band and gluteal fascia, a  2-0 Vicryl in the sub-Q tissue and a running 4-0 Monocryl in the skin.  No drain was needed.  The  hip was cleaned, dried, and dressed sterilely using Dermabond and Aquacel dressing.  She was then brought to recovery room, extubated in stable condition, tolerating the procedure well.  Lanney Gins, PA-C was present and utilized as Geophysicist/field seismologist for the entire case from  Preoperative positioning to management of the contralateral extremity and retractors to  General facilitation of the procedure.  He was also involved with primary wound closure.         Madlyn Frankel Charlann Boxer, M.D.

## 2013-07-02 NOTE — Anesthesia Preprocedure Evaluation (Addendum)
Anesthesia Evaluation  Patient identified by MRN, date of birth, ID band Patient awake    Reviewed: Allergy & Precautions, H&P , NPO status , Patient's Chart, lab work & pertinent test results  Airway Mallampati: III TM Distance: >3 FB Neck ROM: Full    Dental no notable dental hx. (+) Teeth Intact and Caps   Pulmonary pneumonia -, resolved,  breath sounds clear to auscultation  Pulmonary exam normal       Cardiovascular Exercise Tolerance: Poor hypertension, Pt. on medications + CAD, + Peripheral Vascular Disease and +CHF + dysrhythmias Atrial Fibrillation + pacemaker Rhythm:Regular Rate:Normal  Pacemaker for SSS and Tachy/brady Dual chamber Medtronic, last interrogation 04/2013 A sensed/V paced 63%, A paced /V paced 37%. V paced 100%.   Neuro/Psych PSYCHIATRIC DISORDERS Dementia Expressive Aphasia from CVA and gait disturbance. S/P clipping of cerebral anuerysm TIACVA, Residual Symptoms    GI/Hepatic negative GI ROS, Neg liver ROS,   Endo/Other  Hypothyroidism   Renal/GU negative Renal ROS   Pelvic Relaxation Cystocele    Musculoskeletal   Abdominal   Peds  Hematology negative hematology ROS (+)   Anesthesia Other Findings   Reproductive/Obstetrics negative OB ROS                         Anesthesia Physical  Anesthesia Plan  ASA: IV  Anesthesia Plan: Spinal   Post-op Pain Management:    Induction: Intravenous  Airway Management Planned:   Additional Equipment:   Intra-op Plan:   Post-operative Plan: Extubation in OR  Informed Consent: I have reviewed the patients History and Physical, chart, labs and discussed the procedure including the risks, benefits and alternatives for the proposed anesthesia with the patient or authorized representative who has indicated his/her understanding and acceptance.   Dental advisory given  Plan Discussed with: CRNA  Anesthesia Plan  Comments: (Had spinal in March 2014 and did well. Plan spinal. Discussed risks with patient and her son.Discussed risks/benefits of spinal including headache, backache, failure, bleeding, infection, and nerve damage. Patient consents to spinal. Questions answered. platelet count acceptable. Off aggrenox for two days. There are no specific recommendations from ASRA as regards spinal placement and aggrenox. Aggrenox does not change PTT, bleeding time, nor INR.  She was on aggrenox in March at the time of her spinal and surgery. Believe benefits outweigh risks of spinal in this patient. Her son agrees and believes her doctors agree that spinal would be best. )    Anesthesia Quick Evaluation

## 2013-07-02 NOTE — Progress Notes (Signed)
Patient referred to CSW for SNF placement- order received this a.m.  Patient transferred to [facility] for hip surgery.  CSW notified Cori Razor, LCSW of patient's transfer. Patient resided at [facility] and will require SNF placement.  Per nursing- family is requesting [facility]. [facility] CSW will follow; this CSW will sign off.  Lorri Frederick. [place]  [phone]

## 2013-07-02 NOTE — Preoperative (Signed)
Beta Blockers   Reason not to administer Beta Blockers:Not Applicable 

## 2013-07-02 NOTE — Progress Notes (Signed)
Patient ID: [name], female   DOB: 07-12-1922, 79 y.o.   MRN: [id number]   Left displaced femoral neck fracture.  Stable over night, no issues  Spoke to her son last night re treatment plan - consent to be signed today  NPO Will plan to have transferred to [facility] today for surgery hopefully around 2pm  Left hip hemiarthroplasty

## 2013-07-02 NOTE — Progress Notes (Signed)
Pacemaker interrogation reviewed in detail (see paper chart). Normal pacemaker function No changes required.  Will sign off.  Please call with questions.

## 2013-07-02 NOTE — Progress Notes (Signed)
Pt transferred to [facility] by ambulance, son at bedside, pt able to communicate needs, remains alert and oriented times 4.

## 2013-07-02 NOTE — Anesthesia Postprocedure Evaluation (Signed)
  Anesthesia Post-op Note  Patient: [name]  Procedure(s) Performed: Procedure(s) (LRB): ARTHROPLASTY BIPOLAR HIP (Left)  Patient Location: PACU  Anesthesia Type: Spinal  Level of Consciousness: awake and alert   Airway and Oxygen Therapy: Patient Spontanous Breathing  Post-op Pain: mild  Post-op Assessment: Post-op Vital signs reviewed, Patient's Cardiovascular Status Stable, Respiratory Function Stable, Patent Airway and No signs of Nausea or vomiting  Last Vitals:  Filed Vitals:   07/02/13 1700  BP: 103/82  Pulse:   Temp: 36.3 C  Resp:     Post-op Vital Signs: stable   Complications: No apparent anesthesia complications

## 2013-07-02 NOTE — Transfer of Care (Signed)
Immediate Anesthesia Transfer of Care Note  Patient: [name]  Procedure(s) Performed: Procedure(s): ARTHROPLASTY BIPOLAR HIP (Left)  Patient Location: PACU  Anesthesia Type:Spinal  Level of Consciousness: awake, alert , oriented and patient cooperative  Airway & Oxygen Therapy: Patient Spontanous Breathing and Patient connected to face mask oxygen  Post-op Assessment: Report given to PACU RN and Post -op Vital signs reviewed and stable  Post vital signs: Reviewed and stable  Complications: No apparent anesthesia complications

## 2013-07-02 NOTE — Progress Notes (Signed)
Dr. Illene Silver notified about sputum per request of family, no new orders at this time, will continue to monitor.

## 2013-07-02 NOTE — Progress Notes (Signed)
[facility] PROGRESS NOTE Interim History: 79 y.o. female with extensive past medical history-recurrent CVA with residual dysarthria and expressive aphasia, on Aggrenox which she takes 2 tabs daily in the morning, AV block-status post permanent pacemaker, dementia, hyperlipidemia, hypertension, hypothyroid, chronic ? diastolic CHF, CAD presented to the ED on 07/01/13 with left hip pain following fall. Patient is a resident of [facility]. History was provided by patient and her son/health care power of attorney Mr. [name] who is at the bedside. Sometime this morning, patient was watching TV and trying to fix her some breakfast and coffee and she turned. She states that her home footwear may have gotten entangled on the carpet and she sustained a fall landing on her left side. She immediately experienced excruciating left hip pain which was made worse with movement and patient was unable to get up. She said she mildly hit her head but did not lose consciousness.     Assessment/Plan: Closed fracture of left hip: - high risk for any cardio-pulmonary complication pt understand risk and benefits. Will like to proceed with surgery. - cardiology evaluated her no further testing, - Surgery 10.7.2014. - pain not control increase narcotics.  Hypokalemia  - Replete check b-met in am.  Status post permanent pacemaker for complete heart block/A. fib/CAD/chronic CHF-? Diastolic  - Compensated.  - Continue home medications and monitor on telemetry.  - Not on anticoagulation: Possibly from fall risk.   History of recurrent CVA with residual dysarthria and expressive aphasia/dementia  - Hold Aggrenox for surgery.   Hypertension  - Controlled   Hypothyroidism  - Continue Synthroid    Code Status: Full  Family Communication: And discussed with patient's son/health care power of attorney Mr. [name] (cell phone number : [phone]) at bedside.  Disposition Plan:  Possibly SNF when medically stable.     Consultants:  Ortho  Procedures:  CXR  Antibiotics:  none (indicate start date, and stop date if known)  HPI/Subjective: Complaining of left hip pain. Narcotics only seem to reduce it very minimal  Objective: Filed Vitals:   07/01/13 1250 07/01/13 2117 07/02/13 0133 07/02/13 0500  BP: 136/63 133/52 136/52 140/62  Pulse: 68 68 76 69  Temp:  99.3 F (37.4 C) 98.8 F (37.1 C) 97.3 F (36.3 C)  TempSrc:  Oral Oral Oral  Resp: 20 18 18 22   Height: 5\' 5"  (1.651 m)     Weight: 62.506 kg (137 lb 12.8 oz)   61.281 kg (135 lb 1.6 oz)  SpO2: 96% 94% 95% 95%    Intake/Output Summary (Last 24 hours) at 07/02/13 0828 Last data filed at 07/02/13 0645  Gross per 24 hour  Intake 1063.33 ml  Output    350 ml  Net 713.33 ml   Filed Weights   07/01/13 1250 07/02/13 0500  Weight: 62.506 kg (137 lb 12.8 oz) 61.281 kg (135 lb 1.6 oz)    Exam:  General: Alert, awake, oriented x3, in no acute distress.  HEENT: No bruits, no goiter.  Heart: Regular rate and rhythm, without murmurs, rubs, gallops.  Lungs: Good air movement, clear to auscultation. Abdomen: Soft, nontender, nondistended, positive bowel sounds.  Neuro: Grossly intact, nonfocal.   Data Reviewed: Basic Metabolic Panel:  Recent Labs Lab 07/01/13 0926 07/02/13 0600  NA 140 137  K 3.4* 3.4*  CL 103 102  CO2 22 23  GLUCOSE 106* 133*  BUN 27* 21  CREATININE 1.02 0.75  CALCIUM 9.4 8.7   Liver Function Tests: No  results found for this basename: AST, ALT, ALKPHOS, BILITOT, PROT, ALBUMIN,  in the last 168 hours No results found for this basename: LIPASE, AMYLASE,  in the last 168 hours No results found for this basename: AMMONIA,  in the last 168 hours CBC:  Recent Labs Lab 07/01/13 0926 07/02/13 0600  WBC 6.1 9.2  NEUTROABS 3.7  --   HGB 14.3 12.6  HCT 40.5 36.1  MCV 89.4 90.3  PLT 254 223   Cardiac Enzymes: No results found for this basename: CKTOTAL, CKMB,  CKMBINDEX, TROPONINI,  in the last 168 hours BNP (last 3 results) No results found for this basename: PROBNP,  in the last 8760 hours CBG: No results found for this basename: GLUCAP,  in the last 168 hours  No results found for this or any previous visit (from the past 240 hour(s)).   Studies: Dg Chest 1 View  07/01/2013   CLINICAL DATA:  Status post fall.  EXAM: CHEST - 1 VIEW  COMPARISON:  Chest radiograph 10/26/2012  FINDINGS: Dual lead pacer apparatus overlies the left hemithorax, leads are stable in position. Stable cardiac and mediastinal contours. Calcification of the transverse thoracic aorta. Elevation of the right hemidiaphragm with mild eventration. No consolidative pulmonary opacities. No pleural effusion or pneumothorax. Regional skeleton is unremarkable.  IMPRESSION: No acute cardiopulmonary process.   Electronically Signed   By: Annia Belt M.D.   On: 07/01/2013 10:10   Dg Hip Complete Left  07/01/2013   CLINICAL DATA:  Status post fall. Left hip pain.  EXAM: LEFT HIP - COMPLETE 2+ VIEW  COMPARISON:  None  FINDINGS: There is a displaced complete subcapital left femoral neck fracture. No evidence for associated acute fractures. Multiple pelvic phleboliths. Lower lumbar spine degenerative change. Right hip joint degenerative change.  IMPRESSION: Displaced complete subcapital left femoral neck fracture.   Electronically Signed   By: Annia Belt M.D.   On: 07/01/2013 10:08    Scheduled Meds: . amLODipine  10 mg Oral Daily  . atorvastatin  10 mg Oral Daily  . calcium-vitamin D  1 tablet Oral Q breakfast  . donepezil  10 mg Oral Daily  . heparin  5,000 Units Subcutaneous Q8H  . irbesartan  300 mg Oral Daily  . levothyroxine  125 mcg Oral QAC breakfast  . multivitamin with minerals  1 tablet Oral Daily  . sodium chloride  3 mL Intravenous Q12H   Continuous Infusions: . 0.9 % NaCl with KCl 20 mEq / L 20 mL/hr at 07/01/13 1353     FELIZ Rosine Beat  [facility] Pager  [phone]. If 8PM-8AM, please contact night-coverage at [email or web address], password [facility] 07/02/2013, 8:28 AM  LOS: 1 day

## 2013-07-03 ENCOUNTER — Encounter (HOSPITAL_COMMUNITY): Payer: Self-pay | Admitting: Orthopedic Surgery

## 2013-07-03 DIAGNOSIS — S72033A Displaced midcervical fracture of unspecified femur, initial encounter for closed fracture: Secondary | ICD-10-CM | POA: Diagnosis not present

## 2013-07-03 DIAGNOSIS — I5032 Chronic diastolic (congestive) heart failure: Secondary | ICD-10-CM | POA: Diagnosis not present

## 2013-07-03 DIAGNOSIS — I959 Hypotension, unspecified: Secondary | ICD-10-CM | POA: Diagnosis not present

## 2013-07-03 DIAGNOSIS — S72009A Fracture of unspecified part of neck of unspecified femur, initial encounter for closed fracture: Secondary | ICD-10-CM | POA: Diagnosis not present

## 2013-07-03 DIAGNOSIS — E876 Hypokalemia: Secondary | ICD-10-CM | POA: Diagnosis not present

## 2013-07-03 DIAGNOSIS — I4891 Unspecified atrial fibrillation: Secondary | ICD-10-CM | POA: Diagnosis not present

## 2013-07-03 DIAGNOSIS — K5900 Constipation, unspecified: Secondary | ICD-10-CM | POA: Diagnosis present

## 2013-07-03 DIAGNOSIS — F0390 Unspecified dementia without behavioral disturbance: Secondary | ICD-10-CM | POA: Diagnosis not present

## 2013-07-03 LAB — BASIC METABOLIC PANEL
Calcium: 8.1 mg/dL — ABNORMAL LOW (ref 8.4–10.5)
Chloride: 102 mEq/L (ref 96–112)
GFR calc non Af Amer: 74 mL/min — ABNORMAL LOW (ref >90)
Glucose, Bld: 137 mg/dL — ABNORMAL HIGH (ref 70–99)
Sodium: 135 mEq/L (ref 135–145)

## 2013-07-03 LAB — CBC
MCH: 30.8 pg (ref 26.0–34.0)
MCHC: 33.7 g/dL (ref 30.0–36.0)
Platelets: 209 10*3/uL (ref 150–400)
RDW: 12.8 % (ref 11.5–15.5)

## 2013-07-03 MED ORDER — FERROUS SULFATE 325 (65 FE) MG PO TABS: 325.0000 mg | ORAL_TABLET | Freq: Three times a day (TID) | ORAL | Status: DC

## 2013-07-03 MED ORDER — SODIUM CHLORIDE 0.9 % IV BOLUS (SEPSIS)
250.0000 mL | Freq: Once | INTRAVENOUS | Status: AC
  Administered 2013-07-03: 18:00:00 250 mL via INTRAVENOUS

## 2013-07-03 MED ORDER — DSS 100 MG PO CAPS: 100.0000 mg | ORAL_CAPSULE | Freq: Two times a day (BID) | ORAL | Status: DC

## 2013-07-03 MED ORDER — MORPHINE SULFATE 2 MG/ML IJ SOLN
1.0000 mg | INTRAMUSCULAR | Status: DC | PRN
  Administered 2013-07-03: 1 mg via INTRAVENOUS
  Filled 2013-07-03: qty 1

## 2013-07-03 MED ORDER — POLYETHYLENE GLYCOL 3350 17 G PO PACK: 17.0000 g | PACK | Freq: Every day | ORAL | Status: DC | PRN

## 2013-07-03 MED ORDER — TRAMADOL HCL 50 MG PO TABS: 50.0000 mg | ORAL_TABLET | Freq: Four times a day (QID) | ORAL | Status: DC | PRN

## 2013-07-03 MED ORDER — ASPIRIN-DIPYRIDAMOLE ER 25-200 MG PO CP12
1.0000 | ORAL_CAPSULE | Freq: Two times a day (BID) | ORAL | Status: DC
  Administered 2013-07-03 – 2013-07-05 (×5): 1 via ORAL
  Filled 2013-07-03 (×7): qty 1

## 2013-07-03 MED ORDER — ACETAMINOPHEN 325 MG PO TABS: 650.0000 mg | ORAL_TABLET | Freq: Four times a day (QID) | ORAL | Status: DC | PRN

## 2013-07-03 MED ORDER — ACETAMINOPHEN 500 MG PO TABS
500.0000 mg | ORAL_TABLET | Freq: Three times a day (TID) | ORAL | Status: DC
  Administered 2013-07-03 – 2013-07-05 (×7): 500 mg via ORAL
  Filled 2013-07-03 (×9): qty 1

## 2013-07-03 MED ORDER — POLYETHYLENE GLYCOL 3350 17 G PO PACK
17.0000 g | PACK | Freq: Two times a day (BID) | ORAL | Status: DC
  Administered 2013-07-03 – 2013-07-05 (×4): 17 g via ORAL
  Filled 2013-07-03 (×6): qty 1

## 2013-07-03 NOTE — Progress Notes (Signed)
   Subjective: 1 Day Post-Op Procedure(s) (LRB): ARTHROPLASTY BIPOLAR HIP (Left)   Patient reports pain as mild, pain controlled. No events throughout the night.   Objective:   VITALS:   Filed Vitals:   07/03/13 0523  BP: 127/53  Pulse: 67  Temp: 98.2 F (36.8 C)  Resp: 18    Neurovascular intact Dorsiflexion/Plantar flexion intact Incision: dressing C/[name] No cellulitis present Compartment soft  LABS  Recent Labs  07/01/13 0926 07/02/13 0600 07/03/13 0453  HGB 14.3 12.6 11.7*  HCT 40.5 36.1 34.7*  WBC 6.1 9.2 8.4  PLT 254 223 209     Recent Labs  07/01/13 0926 07/02/13 0600 07/03/13 0453  NA 140 137 135  K 3.4* 3.4* 4.2  BUN 27* 21 18  CREATININE 1.02 0.75 0.74  GLUCOSE 106* 133* 137*     Assessment/Plan: 1 Day Post-Op Procedure(s) (LRB): ARTHROPLASTY BIPOLAR HIP (Left) Up with therapy Discharge to SNF eventually when ready Orthopaedically stable Tramadol for pain, Rx written [place] [facility], for 2 weeks as need / tolerated. MiraLax and Colace to help prevent constipation Resume aggrenox post-op, for anticoagulation. Follow up in 2 weeks at [facility]. Follow up with OLIN,[name] in 2 weeks.  Contact information:  [facility] [address] [place] [address] [phone]        Anastasio Auerbach. Dyer Klug   PAC  07/03/2013, 10:38 AM

## 2013-07-03 NOTE — Clinical Social Work Psychosocial (Signed)
     Clinical Social Work Department BRIEF PSYCHOSOCIAL ASSESSMENT 07/03/2013  Patient:  [name], [name]     Account Number:  [id number]     Admit date:  07/01/2013  Clinical Social Worker:  Hattie Perch  Date/Time:  07/03/2013 12:00 M  Referred by:  Physician  Date Referred:  07/03/2013 Referred for  SNF Placement   Other Referral:   Interview type:  Patient Other interview type:    PSYCHOSOCIAL DATA Living Status:  FACILITY Admitted from facility:  [facility] Level of care:  Independent Living Primary support name:  Courteny Egler Primary support relationship to patient:  CHILD, ADULT Degree of support available:   good    CURRENT CONCERNS Current Concerns  Post-Acute Placement   Other Concerns:    SOCIAL WORK ASSESSMENT / PLAN CSW met with patient. patient is alert and oriented X3 but has expressive aphasia so it is difficult to understand her at times. She understands that she will need snf upon discharge and states that she has been to a snf twice in the past and would like to go there but can not remember the name of it. CSW called patient's daughter, Lurena Joiner, she states that patient has been to [facility] before and they were pleased with it. they would prefer for her to go there.   Assessment/plan status:   Other assessment/ plan:   Information/referral to community resources:    PATIENTS/FAMILYS RESPONSE TO PLAN OF CARE: family agreeable to [facility] as they were previously pleased with patient's progress there.

## 2013-07-03 NOTE — Progress Notes (Signed)
[facility] PROGRESS NOTE  [name] WJX:[id number] DOB: [birth year] DOA: 07/01/2013 PCP: Oliver Barre, MD  Assessment/Plan: #1 left displaced femoral neck fracture Secondary to mechanical fall. Patient is status post left hip hemiarthroplasty per orthopedics. PT/OT. Will resume Aggrenox for DVT prophylaxis per orthopedics. Orthopedics is following and appreciate input and recommendations.  #2 hypotension Questionable etiology. Patient is currently afebrile. Patient denies any chest pain or shortness of breath. Will discontinue IV morphine. Will hold Norvasc. We'll give a normal saline bolus 250 cc x1. Follow.  #3 hypokalemia Replete.  #4 atrial fibrillation/coronary artery disease/chronic CHF/status post permanent pacemaker Stable. Patient is compensated. Continue home medications. Will hold Norvasc secondary to blood pressure issues. Cardiology ff  #5 history of recurrent CVA with residual dysarthria and expressive aphasia/dementia Stable. Resume Aggrenox per orthopedics. Continue Aricept.  #6 hypertension DC Norvasc and Avapro secondary to hypotension. Follow.    #7 hypothyroidism Continue Synthroid.  #8 constipation Continue Colace. Will place on MiraLAX twice a day. Follow.  #9 prophylaxis Resume Aggrenox for DVT prophylaxis per orthopedics. Patient currently on heparin for DVT prophylaxis.   Code Status: Full Family Communication: Updated patient and daughter-in-law at bedside. Disposition Plan: SNF when medically stable.   Consultants:  Orthopedics: Dr. Charlann Boxer 07/01/2013  Cardiology: Dr. Excell Seltzer 07/01/2013  Procedures:  Chest x-ray 07/01/2013  X-ray of the left hip 07/01/2013  X-ray of the pelvis 07/02/2013  Left hip hemiarthroplasty 07/02/2013 per Dr. Charlann Boxer  Antibiotics:  None  HPI/Subjective: Patient states hip pain controlled. Patient c/o constipation.  Objective: Filed Vitals:   07/03/13 1405  BP: 90/49  Pulse: 70  Temp: 97.5 F  (36.4 C)  Resp: 18    Intake/Output Summary (Last 24 hours) at 07/03/13 1425 Last data filed at 07/03/13 0900  Gross per 24 hour  Intake   1190 ml  Output    940 ml  Net    250 ml   Filed Weights   07/02/13 0500 07/02/13 1700 07/03/13 0523  Weight: 61.281 kg (135 lb 1.6 oz) 61.281 kg (135 lb 1.6 oz) 61.344 kg (135 lb 3.8 oz)    Exam:   General:  NAD  Cardiovascular: RRR  Respiratory: CTAB  Abdomen: Soft/NT/ND/=BS  Musculoskeletal: No c/c/e  Data Reviewed: Basic Metabolic Panel:  Recent Labs Lab 07/01/13 0926 07/02/13 0600 07/03/13 0453  NA 140 137 135  K 3.4* 3.4* 4.2  CL 103 102 102  CO2 22 23 23   GLUCOSE 106* 133* 137*  BUN 27* 21 18  CREATININE 1.02 0.75 0.74  CALCIUM 9.4 8.7 8.1*   Liver Function Tests: No results found for this basename: AST, ALT, ALKPHOS, BILITOT, PROT, ALBUMIN,  in the last 168 hours No results found for this basename: LIPASE, AMYLASE,  in the last 168 hours No results found for this basename: AMMONIA,  in the last 168 hours CBC:  Recent Labs Lab 07/01/13 0926 07/02/13 0600 07/03/13 0453  WBC 6.1 9.2 8.4  NEUTROABS 3.7  --   --   HGB 14.3 12.6 11.7*  HCT 40.5 36.1 34.7*  MCV 89.4 90.3 91.3  PLT 254 223 209   Cardiac Enzymes: No results found for this basename: CKTOTAL, CKMB, CKMBINDEX, TROPONINI,  in the last 168 hours BNP (last 3 results) No results found for this basename: PROBNP,  in the last 8760 hours CBG: No results found for this basename: GLUCAP,  in the last 168 hours  Recent Results (from the past 240 hour(s))  SURGICAL PCR SCREEN     Status:  Abnormal   Collection Time    07/02/13  7:43 AM      Result Value Range Status   MRSA, PCR NEGATIVE  NEGATIVE Final   Staphylococcus aureus POSITIVE (*) NEGATIVE Final   Comment:            The Xpert SA Assay (FDA     approved for NASAL specimens     in patients over 21 years of age),     is one component of     a comprehensive surveillance     program.  Test  performance has     been validated by [employer] for patients greater     than or equal to 12 year old.     It is not intended     to diagnose infection nor to     guide or monitor treatment.     Studies: Dg Pelvis Portable  07/02/2013   CLINICAL DATA:  Postop left hip replacement  EXAM: PORTABLE PELVIS  COMPARISON:  Portable exam 1626 hr without priors for comparison.  FINDINGS: Left femoral prosthesis identified in expected position.  No fracture dislocation seen.  Bones appear demineralized.  Mild degenerative changes of right hip joint noted.  Superior pelvis excluded.  IMPRESSION: Left hip prosthesis without acute complication identified.   Electronically Signed   By: Ulyses Southward M.D.   On: 07/02/2013 16:43    Scheduled Meds: . acetaminophen  500 mg Oral TID  . amLODipine  10 mg Oral Daily  . atorvastatin  10 mg Oral Daily  . calcium-vitamin D  1 tablet Oral Q breakfast  . dipyridamole-aspirin  1 capsule Oral BID  . docusate sodium  100 mg Oral BID  . donepezil  10 mg Oral Daily  . ferrous sulfate  325 mg Oral TID PC  . heparin  5,000 Units Subcutaneous Q8H  . irbesartan  300 mg Oral Daily  . levothyroxine  125 mcg Oral QAC breakfast  . multivitamin with minerals  1 tablet Oral Daily  . sodium chloride  250 mL Intravenous Once  . sodium chloride  3 mL Intravenous Q12H   Continuous Infusions:   Principal Problem:   Closed fracture of left hip Active Problems:   HYPOTHYROIDISM   HYPERLIPIDEMIA   DEMENTIA   HYPERTENSION   AV BLOCK, COMPLETE   Aphasia   CEREBROVASCULAR ACCIDENT, HX OF   PACEMAKER, PERMANENT   Hypokalemia   Atrial fibrillation   Hypotension, unspecified   Unspecified constipation    Time spent: > 35 mins    [facility]  [facility] Pager [phone]. If 7PM-7AM, please contact night-coverage at [email or web address], password [facility] 07/03/2013, 2:25 PM  LOS: 2 days

## 2013-07-03 NOTE — Progress Notes (Signed)
OT  Note  Patient Details Name: [name] MRN: [id number] DOB: Dec 22, 1921   Cancelled Treatment:    Reason Eval/Treat Not Completed: Other (comment) (Noted plan is for SNF upon DC. will defer OT eval to SNF)  Sherion Dooly, Metro Kung 07/03/2013, 10:54 AM

## 2013-07-03 NOTE — Care Management Note (Addendum)
    Page 1 of 1   07/05/2013     2:23:18 PM   CARE MANAGEMENT NOTE 07/05/2013  Patient:  [name], [name]   Account Number:  [id number]  Date Initiated:  07/03/2013  Documentation initiated by:  [facility]  Subjective/Objective Assessment:   79 Y/O F ADMITTED W/FALL.HX:CVA,RESIDUAL DYSARTHRIA.     Action/Plan:   FROM [facility]   Anticipated DC Date:  07/05/2013   Anticipated DC Plan:  SKILLED NURSING FACILITY      DC Planning Services  CM consult      Choice offered to / List presented to:             Status of service:  Completed, signed off Medicare Important Message given?   (If response is "NO", the following Medicare IM given date fields will be blank) Date Medicare IM given:   Date Additional Medicare IM given:    Discharge Disposition:  SKILLED NURSING FACILITY  Per UR Regulation:  Reviewed for med. necessity/level of care/duration of stay  If discussed at Long Length of Stay Meetings, dates discussed:    Comments:  07/05/13 Meleena Munroe RN,BSN NCM [phone] D/C SNF.  07/03/13 Kahlen Boyde RN,BSN NCM [phone] POD#1 L HIP HEMI.PT-SNF.

## 2013-07-04 ENCOUNTER — Inpatient Hospital Stay (HOSPITAL_COMMUNITY): Payer: Medicare Other

## 2013-07-04 DIAGNOSIS — I4891 Unspecified atrial fibrillation: Secondary | ICD-10-CM | POA: Diagnosis not present

## 2013-07-04 DIAGNOSIS — I959 Hypotension, unspecified: Secondary | ICD-10-CM | POA: Diagnosis not present

## 2013-07-04 DIAGNOSIS — S72009A Fracture of unspecified part of neck of unspecified femur, initial encounter for closed fracture: Secondary | ICD-10-CM | POA: Diagnosis not present

## 2013-07-04 DIAGNOSIS — K5900 Constipation, unspecified: Secondary | ICD-10-CM | POA: Diagnosis not present

## 2013-07-04 DIAGNOSIS — R1110 Vomiting, unspecified: Secondary | ICD-10-CM | POA: Diagnosis not present

## 2013-07-04 LAB — BASIC METABOLIC PANEL
BUN: 28 mg/dL — ABNORMAL HIGH (ref 6–23)
CO2: 22 mEq/L (ref 19–32)
Calcium: 8 mg/dL — ABNORMAL LOW (ref 8.4–10.5)
Creatinine, Ser: 0.96 mg/dL (ref 0.50–1.10)
GFR calc Af Amer: 59 mL/min — ABNORMAL LOW (ref >90)
GFR calc non Af Amer: 51 mL/min — ABNORMAL LOW (ref >90)
Sodium: 131 mEq/L — ABNORMAL LOW (ref 135–145)

## 2013-07-04 LAB — CBC
Hemoglobin: 11 g/dL — ABNORMAL LOW (ref 12.0–15.0)
MCH: 31.3 pg (ref 26.0–34.0)
MCV: 91.5 fL (ref 78.0–100.0)
Platelets: 208 10*3/uL (ref 150–400)
RBC: 3.51 MIL/uL — ABNORMAL LOW (ref 3.87–5.11)
WBC: 10.1 10*3/uL (ref 4.0–10.5)

## 2013-07-04 MED ORDER — MAGNESIUM CITRATE PO SOLN
1.0000 | Freq: Once | ORAL | Status: AC
  Administered 2013-07-04: 1 via ORAL

## 2013-07-04 MED ORDER — SODIUM CHLORIDE 0.9 % IV SOLN
INTRAVENOUS | Status: DC
  Administered 2013-07-04 – 2013-07-05 (×3): via INTRAVENOUS

## 2013-07-04 NOTE — Progress Notes (Signed)
Patient ID: [name], female   DOB: September 02, 1922, 79 y.o.   MRN: [id number] Subjective: 2 Days Post-Op Procedure(s) (LRB - Left hip hemiarthroplasty  Doing well with no events.      Patient reports pain as mild. Much better than before hemi  Objective:   VITALS:   Filed Vitals:   07/04/13 0522  BP: 105/66  Pulse: 62  Temp: 97.3 F (36.3 C)  Resp: 18    Neurovascular intact Incision: dressing C/D/I - Aquacell  LABS  Recent Labs  07/02/13 0600 07/03/13 0453 07/04/13 0433  HGB 12.6 11.7* 11.0*  HCT 36.1 34.7* 32.1*  WBC 9.2 8.4 10.1  PLT 223 209 208     Recent Labs  07/02/13 0600 07/03/13 0453 07/04/13 0433  NA 137 135 131*  K 3.4* 4.2 4.0  BUN 21 18 28*  CREATININE 0.75 0.74 0.96  GLUCOSE 133* 137* 112*    No results found for this basename: LABPT, INR,  in the last 72 hours   Assessment/Plan: 2 Days Post-Op Procedure(s) (LRB): ARTHROPLASTY BIPOLAR HIP (Left)   Up with therapy Discharge to SNF when stable

## 2013-07-04 NOTE — Progress Notes (Signed)
Went into patient's room and found her vomiting brown emesis.  MD made aware and visualized emesis.  Stat KUB ordered.  Patient made NPO until KUB results. Family made aware.  Will continue to monitor.

## 2013-07-04 NOTE — Progress Notes (Signed)
Patient has a bed at [facility] when medically stable.  Rondle Lohse C. Chamille Werntz MSW, LCSW [phone]

## 2013-07-04 NOTE — Progress Notes (Signed)
[facility] PROGRESS NOTE  [name] ZOX:[id number] DOB: [birth year] DOA: 07/01/2013 PCP: Oliver Barre, MD  Assessment/Plan: #1 left displaced femoral neck fracture Secondary to mechanical fall. Patient is status post left hip hemiarthroplasty per orthopedics. PT/OT. Aggrenox for DVT prophylaxis per orthopedics. Orthopedics is following and appreciate input and recommendations.  #2 hypotension Questionable etiology. Patient is currently afebrile. Patient denies any chest pain or shortness of breath. Will hold Norvasc and avapro. IVF. Follow.  #3 hypokalemia Repleted.  #4 atrial fibrillation/coronary artery disease/chronic CHF/status post permanent pacemaker Stable. Patient is compensated. Continue home medications. Will hold Norvasc secondary to blood pressure issues. Cardiology ff  #5 history of recurrent CVA with residual dysarthria and expressive aphasia/dementia Stable. Resumed Aggrenox yesterday per orthopedics. Continue Aricept.  #6 hypertension D/C Norvasc and Avapro secondary to hypotension. Follow.    #7 hypothyroidism Continue Synthroid.  #8 constipation Continue Colace, MiraLAX twice a day. Will give a dose of magnesium citrate. Follow.  #9 prophylaxis Resumed Aggrenox for DVT prophylaxis per orthopedics. Patient currently on heparin for DVT prophylaxis.   Code Status: Full Family Communication: Updated patient at bedside. Disposition Plan: SNF when medically stable.   Consultants:  Orthopedics: Dr. Charlann Boxer 07/01/2013  Cardiology: Dr. Excell Seltzer 07/01/2013  Procedures:  Chest x-ray 07/01/2013  X-ray of the left hip 07/01/2013  X-ray of the pelvis 07/02/2013  Left hip hemiarthroplasty 07/02/2013 per Dr. Charlann Boxer  Antibiotics:  None  HPI/Subjective: Patient states hip pain controlled. Patient c/o constipation. No BM yesterday.  Objective: Filed Vitals:   07/04/13 0800  BP:   Pulse:   Temp:   Resp: 16    Intake/Output Summary (Last 24  hours) at 07/04/13 1159 Last data filed at 07/04/13 0900  Gross per 24 hour  Intake    240 ml  Output    100 ml  Net    140 ml   Filed Weights   07/02/13 0500 07/02/13 1700 07/03/13 0523  Weight: 61.281 kg (135 lb 1.6 oz) 61.281 kg (135 lb 1.6 oz) 61.344 kg (135 lb 3.8 oz)    Exam:   General:  NAD  Cardiovascular: RRR  Respiratory: CTAB  Abdomen: Soft/NT/ND/=BS  Musculoskeletal: No c/c/e  Data Reviewed: Basic Metabolic Panel:  Recent Labs Lab 07/01/13 0926 07/02/13 0600 07/03/13 0453 07/04/13 0433  NA 140 137 135 131*  K 3.4* 3.4* 4.2 4.0  CL 103 102 102 100  CO2 22 23 23 22   GLUCOSE 106* 133* 137* 112*  BUN 27* 21 18 28*  CREATININE 1.02 0.75 0.74 0.96  CALCIUM 9.4 8.7 8.1* 8.0*   Liver Function Tests: No results found for this basename: AST, ALT, ALKPHOS, BILITOT, PROT, ALBUMIN,  in the last 168 hours No results found for this basename: LIPASE, AMYLASE,  in the last 168 hours No results found for this basename: AMMONIA,  in the last 168 hours CBC:  Recent Labs Lab 07/01/13 0926 07/02/13 0600 07/03/13 0453 07/04/13 0433  WBC 6.1 9.2 8.4 10.1  NEUTROABS 3.7  --   --   --   HGB 14.3 12.6 11.7* 11.0*  HCT 40.5 36.1 34.7* 32.1*  MCV 89.4 90.3 91.3 91.5  PLT 254 223 209 208   Cardiac Enzymes: No results found for this basename: CKTOTAL, CKMB, CKMBINDEX, TROPONINI,  in the last 168 hours BNP (last 3 results) No results found for this basename: PROBNP,  in the last 8760 hours CBG: No results found for this basename: GLUCAP,  in the last 168 hours  Recent Results (from the  past 240 hour(s))  SURGICAL PCR SCREEN     Status: Abnormal   Collection Time    07/02/13  7:43 AM      Result Value Range Status   MRSA, PCR NEGATIVE  NEGATIVE Final   Staphylococcus aureus POSITIVE (*) NEGATIVE Final   Comment:            The Xpert SA Assay (FDA     approved for NASAL specimens     in patients over 61 years of age),     is one component of     a  comprehensive surveillance     program.  Test performance has     been validated by [employer] for patients greater     than or equal to 59 year old.     It is not intended     to diagnose infection nor to     guide or monitor treatment.     Studies: Dg Pelvis Portable  07/02/2013   CLINICAL DATA:  Postop left hip replacement  EXAM: PORTABLE PELVIS  COMPARISON:  Portable exam 1626 hr without priors for comparison.  FINDINGS: Left femoral prosthesis identified in expected position.  No fracture dislocation seen.  Bones appear demineralized.  Mild degenerative changes of right hip joint noted.  Superior pelvis excluded.  IMPRESSION: Left hip prosthesis without acute complication identified.   Electronically Signed   By: Ulyses Southward M.D.   On: 07/02/2013 16:43    Scheduled Meds: . acetaminophen  500 mg Oral TID  . atorvastatin  10 mg Oral Daily  . calcium-vitamin D  1 tablet Oral Q breakfast  . dipyridamole-aspirin  1 capsule Oral BID  . docusate sodium  100 mg Oral BID  . donepezil  10 mg Oral Daily  . ferrous sulfate  325 mg Oral TID PC  . heparin  5,000 Units Subcutaneous Q8H  . levothyroxine  125 mcg Oral QAC breakfast  . multivitamin with minerals  1 tablet Oral Daily  . polyethylene glycol  17 g Oral BID  . sodium chloride  3 mL Intravenous Q12H   Continuous Infusions: . sodium chloride 75 mL/hr at 07/04/13 0759    Principal Problem:   Closed fracture of left hip Active Problems:   HYPOTHYROIDISM   HYPERLIPIDEMIA   DEMENTIA   HYPERTENSION   AV BLOCK, COMPLETE   Aphasia   CEREBROVASCULAR ACCIDENT, HX OF   PACEMAKER, PERMANENT   Hypokalemia   Atrial fibrillation   Hypotension, unspecified   Unspecified constipation    Time spent: > 35 mins    [facility]  [facility] Pager [phone]. If 7PM-7AM, please contact night-coverage at [email or web address], password [facility] 07/04/2013, 11:59 AM  LOS: 3 days

## 2013-07-04 NOTE — Progress Notes (Signed)
PT Cancellation Note/late entry for 07/03/13  Patient Details Name: [name] MRN: [id number] DOB: Mar 15, 1922   Cancelled Treatment:    Reason Eval/Treat Not Completed:  (pt was already up by nursing at the time PT came  In PM  On 07/03/13)   Rada Hay 07/04/2013, 10:46 AM  Blanchard Kelch PT [phone]

## 2013-07-04 NOTE — Evaluation (Signed)
Physical Therapy Evaluation Patient Details Name: [name] MRN: [id number] DOB: March 09, 1922 Today's Date: 07/04/2013 Time: 9562-1308 PT Time Calculation (min): 60 min  PT Assessment / Plan / Recommendation History of Present Illness  Pt admitted 07/01/13 after fall in her independent living apt and fractured L hip. s/p L posterior hemiarthroplasty on 07/02/13  Clinical Impression  Pt tolerated very well  Moving from bed to [facility] to recliner. Pt will benefit from PT to address problems listed below.  Pt will benefit from post acute rehab as pt lives alone.  PT Assessment  Patient needs continued PT services    Follow Up Recommendations  SNF    Does the patient have the potential to tolerate intense rehabilitation      Barriers to Discharge Decreased caregiver support      Equipment Recommendations  None recommended by PT    Recommendations for Other Services OT consult   Frequency Min 3X/week    Precautions / Restrictions Precautions Precautions: Posterior Hip   Pertinent Vitals/Pain Indicates that L hip is painful, had medication.      Mobility  Bed Mobility Bed Mobility: Supine to Sit;Sitting - Scoot to Edge of Bed Supine to Sit: 2: Max assist;HOB elevated;With rails Sitting - Scoot to Edge of Bed: 3: Mod assist Details for Bed Mobility Assistance: lifting assistance of trunk to get into sitting on the edge of the bed. Transfers Transfers: Sit to Stand;Stand to [employer] Transfers Sit to Stand: 2: Max assist;From bed;From chair/3-in-1;With upper extremity assist;With armrests Stand to Sit: To chair/3-in-1;With upper extremity assist;3: Mod assist;With armrests Stand Pivot Transfers: 3: Mod assist Details for Transfer Assistance: pt able to stand at [facility] and pivot to [facility], multimodal cues for precautions, Ambulation/Gait Ambulation/Gait Assistance: 3: Mod assist Ambulation Distance (Feet): 5 Feet Assistive device: Rolling walker Ambulation/Gait  Assistance Details: cues for safe use of RW, hip precautions.    Exercises Total Joint Exercises Ankle Circles/Pumps: AROM;Both;10 reps;Supine Short Arc Quad: Both;10 reps;Supine Heel Slides: AAROM;Both;5 reps;10 reps;Supine Hip ABduction/ADduction: AAROM;5 reps;Left Straight Leg Raises: AROM;Right;Supine;10 reps   PT Diagnosis: Difficulty walking;Acute pain  PT Problem List: Decreased strength;Decreased range of motion;Decreased activity tolerance;Decreased mobility;Decreased knowledge of use of DME;Decreased safety awareness;Decreased knowledge of precautions;Pain PT Treatment Interventions: DME instruction;Gait training;Functional mobility training;Therapeutic activities;Therapeutic exercise;Patient/family education     PT Goals(Current goals can be found in the care plan section) Acute Rehab PT Goals Patient Stated Goal: I want to do what I need to do to walk. PT Goal Formulation: With patient/family Time For Goal Achievement: 07/18/13 Potential to Achieve Goals: Good  Visit Information  Last PT Received On: 07/04/13 Assistance Needed:  (+2 if walk) Reason Eval/Treat Not Completed:  (pt was already up by nursing at the time PT came .) History of Present Illness: Pt admitted 07/01/13 after fall in her independent living apt and fractured L hip. s/p L posterior hemiarthroplasty on 07/02/13       Prior Functioning  Home Living Family/patient expects to be discharged to:: Skilled nursing facility Additional Comments: independent living Prior Function Level of Independence: Independent with assistive device(s) Comments: legally blind, cannot read. Communication Communication: HOH    Cognition  Cognition Arousal/Alertness: Awake/alert Behavior During Therapy: WFL for tasks assessed/performed Overall Cognitive Status: Within Functional Limits for tasks assessed    Extremity/Trunk Assessment Upper Extremity Assessment Upper Extremity Assessment: Generalized weakness Lower  Extremity Assessment Lower Extremity Assessment: LLE deficits/detail LLE Deficits / Details: able to bear weight on LLE for transfers.   Balance Balance  Balance Assessed: Yes Static Sitting Balance Static Sitting - Balance Support: Bilateral upper extremity supported;Feet supported Static Sitting - Level of Assistance: 5: Stand by assistance Static Standing Balance Static Standing - Balance Support: Bilateral upper extremity supported Static Standing - Level of Assistance: 3: Mod assist  End of Session PT - End of Session Equipment Utilized During Treatment: Gait belt Activity Tolerance: Patient tolerated treatment well Patient left: in chair;with call bell/phone within reach;with chair alarm set Nurse Communication: Mobility status  GP     Rada Hay 07/04/2013, 1:24 PM  Blanchard Kelch PT [phone]

## 2013-07-05 DIAGNOSIS — J690 Pneumonitis due to inhalation of food and vomit: Secondary | ICD-10-CM | POA: Diagnosis not present

## 2013-07-05 DIAGNOSIS — A419 Sepsis, unspecified organism: Secondary | ICD-10-CM | POA: Diagnosis not present

## 2013-07-05 DIAGNOSIS — Z950 Presence of cardiac pacemaker: Secondary | ICD-10-CM | POA: Diagnosis not present

## 2013-07-05 DIAGNOSIS — M25559 Pain in unspecified hip: Secondary | ICD-10-CM | POA: Diagnosis not present

## 2013-07-05 DIAGNOSIS — R404 Transient alteration of awareness: Secondary | ICD-10-CM | POA: Diagnosis not present

## 2013-07-05 DIAGNOSIS — I959 Hypotension, unspecified: Secondary | ICD-10-CM | POA: Diagnosis not present

## 2013-07-05 DIAGNOSIS — K219 Gastro-esophageal reflux disease without esophagitis: Secondary | ICD-10-CM | POA: Diagnosis not present

## 2013-07-05 DIAGNOSIS — S72009A Fracture of unspecified part of neck of unspecified femur, initial encounter for closed fracture: Secondary | ICD-10-CM | POA: Diagnosis not present

## 2013-07-05 DIAGNOSIS — I69920 Aphasia following unspecified cerebrovascular disease: Secondary | ICD-10-CM | POA: Diagnosis not present

## 2013-07-05 DIAGNOSIS — Z9181 History of falling: Secondary | ICD-10-CM | POA: Diagnosis not present

## 2013-07-05 DIAGNOSIS — M19049 Primary osteoarthritis, unspecified hand: Secondary | ICD-10-CM | POA: Diagnosis present

## 2013-07-05 DIAGNOSIS — M1990 Unspecified osteoarthritis, unspecified site: Secondary | ICD-10-CM | POA: Diagnosis not present

## 2013-07-05 DIAGNOSIS — G9340 Encephalopathy, unspecified: Secondary | ICD-10-CM | POA: Diagnosis not present

## 2013-07-05 DIAGNOSIS — I4891 Unspecified atrial fibrillation: Secondary | ICD-10-CM | POA: Diagnosis not present

## 2013-07-05 DIAGNOSIS — D649 Anemia, unspecified: Secondary | ICD-10-CM | POA: Diagnosis not present

## 2013-07-05 DIAGNOSIS — I509 Heart failure, unspecified: Secondary | ICD-10-CM | POA: Diagnosis present

## 2013-07-05 DIAGNOSIS — S72009D Fracture of unspecified part of neck of unspecified femur, subsequent encounter for closed fracture with routine healing: Secondary | ICD-10-CM | POA: Diagnosis not present

## 2013-07-05 DIAGNOSIS — K56609 Unspecified intestinal obstruction, unspecified as to partial versus complete obstruction: Secondary | ICD-10-CM | POA: Diagnosis not present

## 2013-07-05 DIAGNOSIS — R5381 Other malaise: Secondary | ICD-10-CM | POA: Diagnosis not present

## 2013-07-05 DIAGNOSIS — D509 Iron deficiency anemia, unspecified: Secondary | ICD-10-CM | POA: Diagnosis not present

## 2013-07-05 DIAGNOSIS — J9600 Acute respiratory failure, unspecified whether with hypoxia or hypercapnia: Secondary | ICD-10-CM | POA: Diagnosis not present

## 2013-07-05 DIAGNOSIS — K560 Paralytic ileus: Secondary | ICD-10-CM | POA: Diagnosis not present

## 2013-07-05 DIAGNOSIS — K929 Disease of digestive system, unspecified: Secondary | ICD-10-CM | POA: Diagnosis not present

## 2013-07-05 DIAGNOSIS — E876 Hypokalemia: Secondary | ICD-10-CM | POA: Diagnosis not present

## 2013-07-05 DIAGNOSIS — Z96649 Presence of unspecified artificial hip joint: Secondary | ICD-10-CM | POA: Diagnosis not present

## 2013-07-05 DIAGNOSIS — E039 Hypothyroidism, unspecified: Secondary | ICD-10-CM | POA: Diagnosis not present

## 2013-07-05 DIAGNOSIS — M6281 Muscle weakness (generalized): Secondary | ICD-10-CM | POA: Diagnosis not present

## 2013-07-05 DIAGNOSIS — K4030 Unilateral inguinal hernia, with obstruction, without gangrene, not specified as recurrent: Secondary | ICD-10-CM | POA: Diagnosis not present

## 2013-07-05 DIAGNOSIS — S72009S Fracture of unspecified part of neck of unspecified femur, sequela: Secondary | ICD-10-CM | POA: Diagnosis not present

## 2013-07-05 DIAGNOSIS — I10 Essential (primary) hypertension: Secondary | ICD-10-CM | POA: Diagnosis present

## 2013-07-05 DIAGNOSIS — I2510 Atherosclerotic heart disease of native coronary artery without angina pectoris: Secondary | ICD-10-CM | POA: Diagnosis present

## 2013-07-05 DIAGNOSIS — K55059 Acute (reversible) ischemia of intestine, part and extent unspecified: Secondary | ICD-10-CM | POA: Diagnosis not present

## 2013-07-05 DIAGNOSIS — R112 Nausea with vomiting, unspecified: Secondary | ICD-10-CM | POA: Diagnosis not present

## 2013-07-05 DIAGNOSIS — Z79899 Other long term (current) drug therapy: Secondary | ICD-10-CM | POA: Diagnosis not present

## 2013-07-05 DIAGNOSIS — Z4789 Encounter for other orthopedic aftercare: Secondary | ICD-10-CM | POA: Diagnosis not present

## 2013-07-05 DIAGNOSIS — R269 Unspecified abnormalities of gait and mobility: Secondary | ICD-10-CM | POA: Diagnosis not present

## 2013-07-05 DIAGNOSIS — R0681 Apnea, not elsewhere classified: Secondary | ICD-10-CM | POA: Diagnosis not present

## 2013-07-05 DIAGNOSIS — R279 Unspecified lack of coordination: Secondary | ICD-10-CM | POA: Diagnosis not present

## 2013-07-05 DIAGNOSIS — K4090 Unilateral inguinal hernia, without obstruction or gangrene, not specified as recurrent: Secondary | ICD-10-CM | POA: Diagnosis not present

## 2013-07-05 DIAGNOSIS — K559 Vascular disorder of intestine, unspecified: Secondary | ICD-10-CM | POA: Diagnosis present

## 2013-07-05 DIAGNOSIS — F0390 Unspecified dementia without behavioral disturbance: Secondary | ICD-10-CM | POA: Diagnosis present

## 2013-07-05 DIAGNOSIS — F411 Generalized anxiety disorder: Secondary | ICD-10-CM | POA: Diagnosis present

## 2013-07-05 DIAGNOSIS — M25569 Pain in unspecified knee: Secondary | ICD-10-CM | POA: Diagnosis not present

## 2013-07-05 DIAGNOSIS — IMO0001 Reserved for inherently not codable concepts without codable children: Secondary | ICD-10-CM | POA: Diagnosis not present

## 2013-07-05 DIAGNOSIS — N179 Acute kidney failure, unspecified: Secondary | ICD-10-CM | POA: Diagnosis present

## 2013-07-05 DIAGNOSIS — E785 Hyperlipidemia, unspecified: Secondary | ICD-10-CM | POA: Diagnosis present

## 2013-07-05 LAB — BASIC METABOLIC PANEL
CO2: 23 mEq/L (ref 19–32)
Chloride: 104 mEq/L (ref 96–112)
GFR calc Af Amer: 88 mL/min — ABNORMAL LOW (ref >90)
Potassium: 3.8 mEq/L (ref 3.5–5.1)
Sodium: 137 mEq/L (ref 135–145)

## 2013-07-05 LAB — CBC
Platelets: 225 10*3/uL (ref 150–400)
RBC: 3.47 MIL/uL — ABNORMAL LOW (ref 3.87–5.11)
RDW: 12.8 % (ref 11.5–15.5)
WBC: 8.5 10*3/uL (ref 4.0–10.5)

## 2013-07-05 MED ORDER — TELMISARTAN 80 MG PO TABS: 80.0000 mg | ORAL_TABLET | Freq: Every day | ORAL | Status: DC

## 2013-07-05 MED ORDER — AMLODIPINE BESYLATE 10 MG PO TABS: 10.0000 mg | ORAL_TABLET | Freq: Every day | ORAL | Status: DC

## 2013-07-05 MED ORDER — POLYETHYLENE GLYCOL 3350 17 G PO PACK: 17.0000 g | PACK | Freq: Every day | ORAL | Status: DC

## 2013-07-05 MED ORDER — POLYETHYLENE GLYCOL 3350 17 G PO PACK: 17.0000 g | PACK | Freq: Two times a day (BID) | ORAL | Status: DC

## 2013-07-05 MED ORDER — METHOCARBAMOL 500 MG PO TABS: 500.0000 mg | ORAL_TABLET | Freq: Four times a day (QID) | ORAL | Status: DC | PRN

## 2013-07-05 NOTE — Progress Notes (Signed)
Patient cleared for discharge. Packet copied and placed in [place]. CSW called patient's son and left voicemail. CSW also called patient's DIL, Lurena Joiner and spoke with her informing of discharge. She is agreeable to transfer. She thanked CSW for assistance. Ptar called for transportation. Family understands no guarantee of payment.  Delson Dulworth C. Redell Bhandari MSW, LCSW [phone]

## 2013-07-05 NOTE — Discharge Summary (Signed)
Physician Discharge Summary  [name] ZOX:[id number] DOB: 07-02-1922 DOA: 07/01/2013  PCP: Oliver Barre, MD  Admit date: 07/01/2013 Discharge date: 07/05/2013  Time spent: 65 MINS minutes  Recommendations for Outpatient Follow-up:  1. Follow up with Dr. Charlann Boxer of orthopedics in 2 weeks. 2. Followup with M.D. at the skilled nursing facility.  Discharge Diagnoses:  Principal Problem:   Closed fracture of left hip Active Problems:   HYPOTHYROIDISM   HYPERLIPIDEMIA   DEMENTIA   HYPERTENSION   AV BLOCK, COMPLETE   Aphasia   CEREBROVASCULAR ACCIDENT, HX OF   PACEMAKER, PERMANENT   Hypokalemia   Atrial fibrillation   Hypotension, unspecified   Unspecified constipation   Discharge Condition: Stable and improved.  Diet recommendation: Heart healthy  Filed Weights   07/02/13 1700 07/03/13 0523 07/05/13 0649  Weight: 61.281 kg (135 lb 1.6 oz) 61.344 kg (135 lb 3.8 oz) 65.091 kg (143 lb 8 oz)    History of present illness:  [name] is a 79 y.o. female with extensive past medical history-recurrent CVA with residual dysarthria and expressive aphasia, on Aggrenox which she takes 2 tabs daily in the morning, AV block-status post permanent pacemaker, dementia, hyperlipidemia, hypertension, hypothyroid, chronic ? diastolic CHF, CAD presented to the ED on 07/01/13 with left hip pain following fall. Patient is a resident of [facility] independent living. History was provided by patient and her son/health care power of attorney Mr. Aminat Shelburne who is at the bedside. Sometime this morning, patient was watching TV and trying to fix her some breakfast and coffee and she turned. She states that her home footwear may have gotten entangled on the carpet and she sustained a fall landing on her left side. She immediately experienced excruciating left hip pain which was made worse with movement and patient was unable to get up. She said she mildly hit her head but did not lose  consciousness. There was no bleeding. She denies chest pain, dyspnea, palpitations, dizziness or lightheadedness at any time. Normally she ambulates independently in her apartment but does use her walker outside. In the ED, vital signs are stable, potassium 3.4, chest x-ray negative for acute cardiopulmonary process and left hip x-ray shows displaced complete subcapital left femoral neck fracture. Hospitalist admission has been requested.      Hospital Course:  #1 left displaced femoral neck fracture  Patient was admitted with left displaced femoral neck fracture, secondary to mechanical fall. An orthopedic consultation was obtained and patient was seen by Dr Charlann Boxer. Cardiology was also consulted for cardiac clearance. Patient underwent left hip hemiarthroplasty per orthopedics. Patient tolerated procedure well. Patient was subsequently restarted on Aggrenox for both a history of recurrent CVAs for DVT prophylaxis. Patient was seen by physical therapy. Patient improved clinically and will be discharged to a skilled nursing facility. Patient will followup with orthopedics as outpatient. #2 hypotension  The hospitalization patient was noted to be hypotensive. It was felt this was likely secondary to hypovolemia. Patient denied any chest pain or shortness of breath. No no signs or symptoms of infection. Patient's antihypertensive medications were held and patient was hydrated with IV fluids. Patient's blood pressure responded to fluids. Patient be discharged to a skilled nursing facility and her blood pressure medication may be resumed in 3 days.  #3 hypokalemia  Repleted.  #4 atrial fibrillation/coronary artery disease/chronic CHF/status post permanent pacemaker  Stable. Patient is compensated. Patient was seen by cardiology for preoperative clearance. Patient's pacemaker was interrogated and showed normal pacemaker function. Was recommended  per cardiology to resume home regimen. Patient was initially  resumed back on the Avapro and Norvasc however these were held secondary to hypotension. These may be resumed to 3 days post discharge.  #5 history of recurrent CVA with residual dysarthria and expressive aphasia/dementia  Stable. Resumed Aggrenox postoperatively per orthopedics. Continued on Aricept.  #6 hypertension  Patient's antihypertensive medications were discontinued secondary to hypotension. Patient's blood pressure responded to IV fluids. Patient's blood pressure medications may be resumed 2-3 days post discharge.  #7 hypothyroidism  Continued on Synthroid.  #8 constipation  During the hospitalization patient did have some complaints of constipation. Patient was maintained on Colace and MiraLAX twice daily. This was subsequently given a dose of magnesium citrate with resolution of her constipation. Patient will be discharged on a bowel regimen.      Procedures: Chest x-ray 07/01/2013  X-ray of the left hip 07/01/2013  X-ray of the pelvis 07/02/2013  Left hip hemiarthroplasty 07/02/2013 per Dr. Charlann Boxer     Consultations: Orthopedics: Dr. Charlann Boxer 07/01/2013  Cardiology: Dr. Excell Seltzer 07/01/2013     Discharge Exam: Filed Vitals:   07/05/13 1100  BP: 122/54  Pulse: 65  Temp:   Resp: 16    General: NAD Cardiovascular: RRR Respiratory: CTAB  Discharge Instructions      Discharge Orders   Future Appointments Provider Department Dept Phone   07/10/2013 10:45 AM Corwin Levins, MD [facility] [phone]   08/12/2013 12:00 PM [facility] [employer] Office [phone]   Future Orders Complete By Expires   Call MD / Call 911  As directed    Comments:     If you experience chest pain or shortness of breath, CALL 911 and be transported to the hospital emergency room.  If you develope a fever above 101 F, pus (white drainage) or increased drainage or redness at the wound, or calf pain, call your surgeon's office.   Change  dressing  As directed    Comments:     Maintain surgical dressing for 10-14 days, then replace with 4x4 guaze and tape. Keep the area dry and clean.   Constipation Prevention  As directed    Comments:     Drink plenty of fluids.  Prune juice may be helpful.  You may use a stool softener, such as Colace (over the counter) 100 mg twice a day.  Use MiraLax (over the counter) for constipation as needed.   Diet - low sodium heart healthy  As directed    Discharge instructions  As directed    Comments:     Maintain surgical dressing for 10-14 days, then replace with gauze and tape. Keep the area dry and clean until follow up. Follow up in 2 weeks at [facility]. Call with any questions or concerns.   Increase activity slowly as tolerated  As directed    TED hose  As directed    Comments:     Use stockings (TED hose) for 2 weeks on both leg(s).  You may remove them at night for sleeping.   Weight bearing as tolerated  As directed    Questions:     Laterality:     Extremity:         Medication List         acetaminophen 325 MG tablet  Commonly known as:  TYLENOL  Take 2 tablets (650 mg total) by mouth every 6 (six) hours as needed.     amLODipine 10 MG  tablet  Commonly known as:  NORVASC  Take 1 tablet (10 mg total) by mouth daily. Resume in 3 days if SBP > 150  Start taking on:  07/08/2013     atorvastatin 10 MG tablet  Commonly known as:  LIPITOR  Take 1 tablet (10 mg total) by mouth daily.     CALTRATE 600+D 600-400 MG-UNIT per tablet  Generic drug:  Calcium Carbonate-Vitamin D  Take 1 tablet by mouth daily.     dipyridamole-aspirin 200-25 MG per 12 hr capsule  Commonly known as:  AGGRENOX  Take 1 capsule by mouth 2 (two) times daily.     donepezil 10 MG tablet  Commonly known as:  ARICEPT  Take 1 tablet (10 mg total) by mouth daily.     DSS 100 MG Caps  Take 100 mg by mouth 2 (two) times daily.     ferrous sulfate 325 (65 FE) MG tablet  Take 1 tablet (325  mg total) by mouth 3 (three) times daily after meals.     levothyroxine 125 MCG tablet  Commonly known as:  SYNTHROID, LEVOTHROID  Take 1 tablet (125 mcg total) by mouth daily.     methocarbamol 500 MG tablet  Commonly known as:  ROBAXIN  Take 1 tablet (500 mg total) by mouth every 6 (six) hours as needed (muscle spasms.).     multivitamin with minerals Tabs tablet  Take 1 tablet by mouth daily. Centrum silver     polyethylene glycol packet  Commonly known as:  MIRALAX / GLYCOLAX  Take 17 g by mouth 2 (two) times daily.     telmisartan 80 MG tablet  Commonly known as:  MICARDIS  Take 1 tablet (80 mg total) by mouth daily. Resume in 2 days.  Start taking on:  07/07/2013     traMADol 50 MG tablet  Commonly known as:  ULTRAM  Take 1-2 tablets (50-100 mg total) by mouth every 6 (six) hours as needed for pain.       Allergies  Allergen Reactions  . Codeine     unknown   Follow-up Information   Follow up with Shelda Pal, MD. Schedule an appointment as soon as possible for a visit in 2 weeks.   Specialty:  Orthopedic Surgery   Contact information:   [address] [place] [address] [phone]       Please follow up. (f/u with MD at [facility])        The results of significant diagnostics from this hospitalization (including imaging, microbiology, ancillary and laboratory) are listed below for reference.    Significant Diagnostic Studies: Dg Chest 1 View  07/01/2013   CLINICAL DATA:  Status post fall.  EXAM: CHEST - 1 VIEW  COMPARISON:  Chest radiograph 10/26/2012  FINDINGS: Dual lead pacer apparatus overlies the left hemithorax, leads are stable in position. Stable cardiac and mediastinal contours. Calcification of the transverse thoracic aorta. Elevation of the right hemidiaphragm with mild eventration. No consolidative pulmonary opacities. No pleural effusion or pneumothorax. Regional skeleton is unremarkable.  IMPRESSION: No acute cardiopulmonary  process.   Electronically Signed   By: Annia Belt M.D.   On: 07/01/2013 10:10   Dg Hip Complete Left  07/01/2013   CLINICAL DATA:  Status post fall. Left hip pain.  EXAM: LEFT HIP - COMPLETE 2+ VIEW  COMPARISON:  None  FINDINGS: There is a displaced complete subcapital left femoral neck fracture. No evidence for associated acute fractures. Multiple pelvic phleboliths. Lower lumbar spine  degenerative change. Right hip joint degenerative change.  IMPRESSION: Displaced complete subcapital left femoral neck fracture.   Electronically Signed   By: Annia Belt M.D.   On: 07/01/2013 10:08   Dg Pelvis Portable  07/02/2013   CLINICAL DATA:  Postop left hip replacement  EXAM: PORTABLE PELVIS  COMPARISON:  Portable exam 1626 hr without priors for comparison.  FINDINGS: Left femoral prosthesis identified in expected position.  No fracture dislocation seen.  Bones appear demineralized.  Mild degenerative changes of right hip joint noted.  Superior pelvis excluded.  IMPRESSION: Left hip prosthesis without acute complication identified.   Electronically Signed   By: Ulyses Southward M.D.   On: 07/02/2013 16:43   Dg Abd Portable 1v  07/04/2013   CLINICAL DATA:  Sudden onset dark brown emesis.  EXAM: PORTABLE ABDOMEN - 1 VIEW  COMPARISON:  Previous related exams.  FINDINGS: Motion blurring. Grossly normal bowel gas pattern. Lumbar spine degenerative changes and left hip prosthesis.  IMPRESSION: No acute abnormality.   Electronically Signed   By: Gordan Payment M.D.   On: 07/04/2013 15:22    Microbiology: Recent Results (from the past 240 hour(s))  SURGICAL PCR SCREEN     Status: Abnormal   Collection Time    07/02/13  7:43 AM      Result Value Range Status   MRSA, PCR NEGATIVE  NEGATIVE Final   Staphylococcus aureus POSITIVE (*) NEGATIVE Final   Comment:            The Xpert SA Assay (FDA     approved for NASAL specimens     in patients over 80 years of age),     is one component of     a comprehensive surveillance      program.  Test performance has     been validated by [employer] for patients greater     than or equal to 8 year old.     It is not intended     to diagnose infection nor to     guide or monitor treatment.     Labs: Basic Metabolic Panel:  Recent Labs Lab 07/01/13 0926 07/02/13 0600 07/03/13 0453 07/04/13 0433 07/05/13 0532  NA 140 137 135 131* 137  K 3.4* 3.4* 4.2 4.0 3.8  CL 103 102 102 100 104  CO2 22 23 23 22 23   GLUCOSE 106* 133* 137* 112* 100*  BUN 27* 21 18 28* 14  CREATININE 1.02 0.75 0.74 0.96 0.67  CALCIUM 9.4 8.7 8.1* 8.0* 7.9*   Liver Function Tests: No results found for this basename: AST, ALT, ALKPHOS, BILITOT, PROT, ALBUMIN,  in the last 168 hours No results found for this basename: LIPASE, AMYLASE,  in the last 168 hours No results found for this basename: AMMONIA,  in the last 168 hours CBC:  Recent Labs Lab 07/01/13 0926 07/02/13 0600 07/03/13 0453 07/04/13 0433 07/05/13 0532  WBC 6.1 9.2 8.4 10.1 8.5  NEUTROABS 3.7  --   --   --   --   HGB 14.3 12.6 11.7* 11.0* 10.8*  HCT 40.5 36.1 34.7* 32.1* 31.6*  MCV 89.4 90.3 91.3 91.5 91.1  PLT 254 223 209 208 225   Cardiac Enzymes: No results found for this basename: CKTOTAL, CKMB, CKMBINDEX, TROPONINI,  in the last 168 hours BNP: BNP (last 3 results) No results found for this basename: PROBNP,  in the last 8760 hours CBG: No results found for this basename: GLUCAP,  in the last 168 hours     Signed:  [facility]  [facility] 07/05/2013, 2:17 PM

## 2013-07-05 NOTE — Progress Notes (Signed)
Patient has been in and out of the bed using a walker to the bedside commode with one assist. She did very well in ambulating to and from the bed the the commode. No complaints made throughtout the night.

## 2013-07-05 NOTE — Clinical Social Work Placement (Signed)
      Clinical Social Work Department CLINICAL SOCIAL WORK PLACEMENT NOTE 07/05/2013  Patient:  [name], [name]  Account Number:  [id number] Admit date:  07/01/2013  Clinical Social Worker:  Becky Sax, LCSW  Date/time:  07/03/2013 12:00 M  Clinical Social Work is seeking post-discharge placement for this patient at the following level of care:   SKILLED NURSING   (*CSW will update this form in Epic as items are completed)   07/03/2013  Patient/family provided with [facility] Department of Clinical Social Works list of facilities offering this level of care within the geographic area requested by the patient (or if unable, by the patients family).  07/03/2013  Patient/family informed of their freedom to choose among providers that offer the needed level of care, that participate in Medicare, Medicaid or managed care program needed by the patient, have an available bed and are willing to accept the patient.  07/03/2013  Patient/family informed of [facility] ownership interest in [facility], as well as of the fact that they are under no obligation to receive care at this facility.  PASARR submitted to EDS on 07/03/2013 PASARR number received from EDS on 07/03/2013  FL2 transmitted to all facilities in geographic area requested by pt/family on  07/05/2013 FL2 transmitted to all facilities within larger geographic area on   Patient informed that his/her managed care company has contracts with or will negotiate with  certain facilities, including the following:     Patient/family informed of bed offers received:  07/05/2013 Patient chooses bed at [facility] Physician recommends and patient chooses bed at  [facility]  Patient to be transferred to [facility] on  07/05/2013 Patient to be transferred to facility by ptar  The following physician request were entered in Epic:   Additional Comments:

## 2013-07-05 NOTE — Progress Notes (Signed)
Patient ID: [name], female   DOB: 20-Apr-1922, 79 y.o.   MRN: [id number] Subjective: 3 Days Post-Op Procedure(s) (LRB): ARTHROPLASTY BIPOLAR HIP (Left)    Patient reports pain as mild.  No reported events from nursing or patient this am.  Slow expected progress thus far  Objective:   VITALS:   Filed Vitals:   07/05/13 0528  BP: 116/54  Pulse: 63  Temp: 97.9 F (36.6 C)  Resp: 18    Neurovascular intact Incision: dressing C/D/I  LABS  Recent Labs  07/03/13 0453 07/04/13 0433 07/05/13 0532  HGB 11.7* 11.0* 10.8*  HCT 34.7* 32.1* 31.6*  WBC 8.4 10.1 8.5  PLT 209 208 225     Recent Labs  07/03/13 0453 07/04/13 0433 07/05/13 0532  NA 135 131* 137  K 4.2 4.0 3.8  BUN 18 28* 14  CREATININE 0.74 0.96 0.67  GLUCOSE 137* 112* 100*    No results found for this basename: LABPT, INR,  in the last 72 hours   Assessment/Plan: 3 Days Post-Op Procedure(s) (LRB): ARTHROPLASTY BIPOLAR HIP (Left)   Up with therapy Discharge to SNF when medically stable - hopefully today Progress with therapy RTC 2 weeks to follow up with progress

## 2013-07-05 NOTE — Progress Notes (Signed)
Physical Therapy Treatment Patient Details Name: [name] MRN: [id number] DOB: 06-03-1922 Today's Date: 07/05/2013 Time: 3086-5784 PT Time Calculation (min): 25 min  PT Assessment / Plan / Recommendation  History of Present Illness     PT Comments   POD # 3 pm session.  Assisted pt out of recliner to [facility] to void then amb in hallway.  Pt plans to D/C to [facility] for ST Rehab prior to returning to [facility].    Follow Up Recommendations  SNF     Does the patient have the potential to tolerate intense rehabilitation     Barriers to Discharge        Equipment Recommendations       Recommendations for Other Services    Frequency Min 3X/week   Progress towards PT Goals Progress towards PT goals: Progressing toward goals  Plan      Precautions / Restrictions Precautions Precautions: Posterior Hip;Fall Precaution Comments: pt instructed on THP as she was unaware Restrictions Weight Bearing Restrictions: Yes LLE Weight Bearing: Weight bearing as tolerated    Pertinent Vitals/Pain No c/o pain    Mobility  Bed Mobility Bed Mobility: Not assessed Details for Bed Mobility Assistance: Pt OOB in recliner Transfers Transfers: Sit to Stand;Stand to Sit Sit to Stand: From chair/3-in-1;From toilet;3: Mod assist;2: Max assist Stand to Sit: 3: Mod assist;2: Max assist;To chair/3-in-1;To toilet Details for Transfer Assistance: 50% VC's on proper tech and safety with turn completion Ambulation/Gait Ambulation/Gait Assistance: 3: Mod assist;4: Min assist Ambulation Distance (Feet): 42 Feet Assistive device: Rolling walker Ambulation/Gait Assistance Details: increased time and 25% VC's on proper upright posture and 50% VC's safety with turns Gait Pattern: Step-to pattern;Trunk flexed Gait velocity: decreased     PT Goals (current goals can now be found in the care plan section)    Visit Information  Last PT Received On: 07/05/13 Assistance Needed:  +2    Subjective Data      Cognition       Balance     End of Session PT - End of Session Equipment Utilized During Treatment: Gait belt Activity Tolerance: Patient tolerated treatment well Patient left: in chair;with chair alarm set;with call bell/phone within reach   Felecia Shelling  PTA [facility]  Acute  Rehab Pager      [phone]

## 2013-07-10 ENCOUNTER — Non-Acute Institutional Stay (SKILLED_NURSING_FACILITY): Payer: Medicare Other | Admitting: Internal Medicine

## 2013-07-10 ENCOUNTER — Ambulatory Visit: Payer: Medicare Other | Admitting: Internal Medicine

## 2013-07-10 DIAGNOSIS — S72002S Fracture of unspecified part of neck of left femur, sequela: Secondary | ICD-10-CM

## 2013-07-10 DIAGNOSIS — E039 Hypothyroidism, unspecified: Secondary | ICD-10-CM | POA: Diagnosis not present

## 2013-07-10 DIAGNOSIS — I4891 Unspecified atrial fibrillation: Secondary | ICD-10-CM

## 2013-07-10 DIAGNOSIS — K219 Gastro-esophageal reflux disease without esophagitis: Secondary | ICD-10-CM

## 2013-07-10 DIAGNOSIS — S72009S Fracture of unspecified part of neck of unspecified femur, sequela: Secondary | ICD-10-CM

## 2013-07-12 ENCOUNTER — Non-Acute Institutional Stay (SKILLED_NURSING_FACILITY): Payer: Medicare Other | Admitting: Adult Health

## 2013-07-12 DIAGNOSIS — E039 Hypothyroidism, unspecified: Secondary | ICD-10-CM | POA: Diagnosis not present

## 2013-07-22 NOTE — Progress Notes (Signed)
Patient ID: [name], female   DOB: 14-Mar-1922, 79 y.o.   MRN: [id number]       PROGRESS NOTE  DATE: 07/12/2013  FACILITY:  [facility]  LEVEL OF CARE: SNF (31)  Acute Visit  CHIEF COMPLAINT:  Manage Hypothyroidism  HISTORY OF PRESENT ILLNESS:This is an 79 year old female who has tsh 7.6750. She has diagnosis of hypothyroidism and currently on Synthroid. No complaints of fatigue.  PAST MEDICAL HISTORY : Reviewed.  No changes.  CURRENT MEDICATIONS: Reviewed per [facility]  REVIEW OF SYSTEMS:  GENERAL: no change in appetite, no fatigue, no weight changes, no fever, chills or weakness RESPIRATORY: no cough, SOB, DOE,, wheezing, hemoptysis CARDIAC: no chest pain, edema or palpitations GI: no abdominal pain, diarrhea, constipation, heart burn, nausea or vomiting  PHYSICAL EXAMINATION  VS:  98.5T        P73       RR20       BP119/63      POX95 %       WT143.4 (Lb)  GENERAL: no acute distress, normal body habitus EYES: conjunctivae normal, sclerae normal, normal eye lids NECK: supple, trachea midline, no neck masses, no thyroid tenderness, no thyromegaly LYMPHATICS: no LAN in the neck, no supraclavicular LAN RESPIRATORY: breathing is even & unlabored, BS CTAB CARDIAC: RRR, no murmur,no extra heart sounds, no edema GI: abdomen soft, normal BS, no masses, no tenderness, no hepatomegaly, no splenomegaly PSYCHIATRIC: the patient is alert & oriented to person, affect & behavior appropriate  LABS/RADIOLOGY: 07/08/13 CBC 7.4 hemoglobin 11 hematocrit 34.6 sodium 137 potassium 2.9 glucose 87 BUN 13 creatinine 0.7 calcium 8.1  Cholesterol 143 HDL 36 LDL 88 TRIG 133 TSH 7.6750   ASSESSMENT/PLAN:  Hypothyroidism - increase Synthroid to 137 mcg 1 tab by mouth daily; TSH in 6 weeks   CPT CODE: 40981

## 2013-08-05 DIAGNOSIS — K219 Gastro-esophageal reflux disease without esophagitis: Secondary | ICD-10-CM | POA: Insufficient documentation

## 2013-08-05 NOTE — Progress Notes (Signed)
Patient ID: [name], female   DOB: 1922/08/17, 79 y.o.   MRN: [id number]        HISTORY & PHYSICAL  DATE: 07/10/2013   FACILITY: [facility]  LEVEL OF CARE: SNF (31)  ALLERGIES:  Allergies  Allergen Reactions  . Codeine     unknown    CHIEF COMPLAINT:  Manage left hip fracture, hypothyroidism, and atrial fibrillation.    HISTORY OF PRESENT ILLNESS:  The patient is an 79 year-old, Caucasian female.    HIP FRACTURE: The patient had a mechanical fall and sustained a femur fracture.  Patient subsequently underwent surgical repair and tolerated the procedure well. Patient is admitted to this facility for short-term rehabilitation. Patient denies hip pain currently. No complications reported from the pain medications currently being used.    ATRIAL FIBRILLATION: the patients atrial fibrillation remains stable.  The patient denies DOE, tachycardia, orthopnea, transient neurological sx, pedal edema, palpitations, & PNDs.  No complications noted from the medications currently being used.    HYPOTHYROIDISM: The hypothyroidism remains stable. No complications noted from the medications presently being used.  The patient denies fatigue or constipation.  Last TSH:  Not available.  PAST MEDICAL HISTORY :  Past Medical History  Diagnosis Date  . Aphasia 01/09/2009  . 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 CEREBROVASCULAR ACCIDENT, HX OF 11/20/2007  . CVA 01/09/2009  . DEMENTIA 05/19/2008  . DIVERTICULOSIS, COLON 11/20/2007  . FATIGUE 11/20/2007  . GLUCOSE INTOLERANCE 12/06/2010  . HYPERLIPIDEMIA 11/20/2007  . HYPERTENSION 11/20/2007    Echo was done 07/07/11: mod LVH, EF 55-60%, grade 1 diast dysfxn, mild MR, mild LAE, mod TR, PASP 39  . HYPOTHYROIDISM 11/20/2007  . IRON DEFICIENCY 12/06/2010  . PACEMAKER, PERMANENT 01/09/2009  . TIA 05/14/2003    elevated CE's in setting of TIA and falls in 10/12 - echo normal with no further cardiac w/u pursued  . Urinary incontinence  01/09/2009  . Impaired glucose tolerance 06/03/2011  . TIA (transient ischemic attack) 10/10/2011  . Dementia   . CHF (congestive heart failure)   . Coronary artery disease   . Arthritis     right hand  . Pacemaker 2002  . Femoral fracture 07/01/2013    PAST SURGICAL HISTORY: Past Surgical History  Procedure Laterality Date  . Medtronic cynthia dual chamber pacemaker serial number was [id number]...04/15/2009    . Abdominal hysterectomy    . S/p cerebral aneurysm  1991  . S/p pacemaker ddd    . Cholecystectomy    . S/p retinal attachment    . S/p thyroidectomy    . Cystocele repair N/A 11/26/2012    Procedure: Cystocele Repair with Lefort Colpocleisis.  colpectomy Levatorplasty Perineorraphy;  Surgeon: Lenoard Aden, MD;  Location: [facility];  Service: Gynecology;  Laterality: N/A;  Cystocele Repair with Lefort Colpocleisis.  . Hip arthroplasty Left 07/02/2013    Procedure: ARTHROPLASTY BIPOLAR HIP;  Surgeon: Shelda Pal, MD;  Location: [facility];  Service: Orthopedics;  Laterality: Left;    SOCIAL HISTORY:  reports that she has never smoked. She has never used smokeless tobacco. She reports that she does not drink alcohol or use illicit drugs.  FAMILY HISTORY:  Family History  Problem Relation Age of Onset  . Cancer Mother     breast cancer    CURRENT MEDICATIONS: Reviewed per [facility]  REVIEW OF SYSTEMS:  See HPI otherwise 14 point ROS is negative.  PHYSICAL EXAMINATION  VS:  T 97.4  P 66      RR 18      BP 129/63      POX 86%        WT (Lb)  GENERAL: no acute distress, normal body habitus EYES: conjunctivae normal, sclerae normal, normal eye lids MOUTH/THROAT: lips without lesions,no lesions in the mouth,tongue is without lesions,uvula elevates in midline NECK: supple, trachea midline, no neck masses, no thyroid tenderness, no thyromegaly LYMPHATICS: no LAN in the neck, no supraclavicular LAN RESPIRATORY: breathing is even & unlabored, BS CTAB CARDIAC: heart rate is  irregular irregular, no murmur,no extra heart sounds, no edema GI:  ABDOMEN: abdomen soft, normal BS, no masses, no tenderness  LIVER/SPLEEN: no hepatomegaly, no splenomegaly MUSCULOSKELETAL: HEAD: normal to inspection & palpation BACK: no kyphosis, scoliosis or spinal processes tenderness EXTREMITIES: LEFT UPPER EXTREMITY: full range of motion, normal strength & tone RIGHT UPPER EXTREMITY:  full range of motion, normal strength & tone LEFT LOWER EXTREMITY: strength intact, range of motion not tested due to surgery   RIGHT LOWER EXTREMITY: strength intact, range of motion moderate   PSYCHIATRIC: the patient is alert & oriented to person, affect & behavior appropriate  LABS/RADIOLOGY: Chest x-ray:  No acute disease.    Left hip x-ray:  Showed displaced complete subcapital fracture.    Pelvic x-ray postsurgically:  Showed left hip prosthesis without acute complications.    Abdominal x-ray:  Did not show any acute abnormalities.    MRSA by PCR negative.     Staph aureus by PCR positive.    Glucose 100, otherwise BMP normal.    Hemoglobin 10.8, MCV 91.1, otherwise CBC normal.    ASSESSMENT/PLAN:  Closed left hip fracture.  Status post left hip hemiarthroplasty.   Continue rehabilitation.    Atrial fibrillation.  Rate controlled.    Hypothyroidism.  Continue levothyroxine.    CAD.  Stable.    CHF.  Well compensated.    History of CVA.  Continue Aggrenox.    Hypertension.  Well controlled.     Check CBC and BMP.     I have reviewed patient's medical records received at admission/from hospitalization.  CPT CODE: 86578

## 2013-08-09 ENCOUNTER — Ambulatory Visit: Payer: Medicare Other | Admitting: Internal Medicine

## 2013-08-13 ENCOUNTER — Emergency Department (HOSPITAL_COMMUNITY): Payer: Medicare Other

## 2013-08-13 ENCOUNTER — Encounter (HOSPITAL_COMMUNITY): Admission: EM | Disposition: A | Discharge status: Skilled Nursing Facility | Payer: Self-pay | Source: Home / Self Care

## 2013-08-13 ENCOUNTER — Inpatient Hospital Stay (HOSPITAL_COMMUNITY)
Admission: EM | Admit: 2013-08-13 | Discharge: 2013-08-23 | DRG: 329 | Disposition: A | Discharge status: Skilled Nursing Facility | Payer: Medicare Other | Attending: General Surgery | Admitting: General Surgery

## 2013-08-13 ENCOUNTER — Encounter (HOSPITAL_COMMUNITY): Payer: Medicare Other | Admitting: Anesthesiology

## 2013-08-13 ENCOUNTER — Inpatient Hospital Stay (HOSPITAL_COMMUNITY): Payer: Medicare Other

## 2013-08-13 ENCOUNTER — Inpatient Hospital Stay (HOSPITAL_COMMUNITY): Payer: Medicare Other | Admitting: Anesthesiology

## 2013-08-13 ENCOUNTER — Encounter (HOSPITAL_COMMUNITY): Payer: Self-pay | Admitting: Emergency Medicine

## 2013-08-13 DIAGNOSIS — R5381 Other malaise: Secondary | ICD-10-CM | POA: Diagnosis not present

## 2013-08-13 DIAGNOSIS — Z4789 Encounter for other orthopedic aftercare: Secondary | ICD-10-CM | POA: Diagnosis not present

## 2013-08-13 DIAGNOSIS — K929 Disease of digestive system, unspecified: Secondary | ICD-10-CM | POA: Diagnosis not present

## 2013-08-13 DIAGNOSIS — M19049 Primary osteoarthritis, unspecified hand: Secondary | ICD-10-CM | POA: Diagnosis present

## 2013-08-13 DIAGNOSIS — R4182 Altered mental status, unspecified: Secondary | ICD-10-CM | POA: Diagnosis not present

## 2013-08-13 DIAGNOSIS — I69920 Aphasia following unspecified cerebrovascular disease: Secondary | ICD-10-CM

## 2013-08-13 DIAGNOSIS — J690 Pneumonitis due to inhalation of food and vomit: Secondary | ICD-10-CM | POA: Diagnosis not present

## 2013-08-13 DIAGNOSIS — E785 Hyperlipidemia, unspecified: Secondary | ICD-10-CM | POA: Diagnosis present

## 2013-08-13 DIAGNOSIS — Z79899 Other long term (current) drug therapy: Secondary | ICD-10-CM | POA: Diagnosis not present

## 2013-08-13 DIAGNOSIS — N179 Acute kidney failure, unspecified: Secondary | ICD-10-CM | POA: Diagnosis present

## 2013-08-13 DIAGNOSIS — I4430 Unspecified atrioventricular block: Secondary | ICD-10-CM | POA: Diagnosis not present

## 2013-08-13 DIAGNOSIS — R0681 Apnea, not elsewhere classified: Secondary | ICD-10-CM | POA: Diagnosis not present

## 2013-08-13 DIAGNOSIS — I10 Essential (primary) hypertension: Secondary | ICD-10-CM | POA: Diagnosis present

## 2013-08-13 DIAGNOSIS — F411 Generalized anxiety disorder: Secondary | ICD-10-CM | POA: Diagnosis present

## 2013-08-13 DIAGNOSIS — A419 Sepsis, unspecified organism: Secondary | ICD-10-CM | POA: Diagnosis not present

## 2013-08-13 DIAGNOSIS — R279 Unspecified lack of coordination: Secondary | ICD-10-CM | POA: Diagnosis not present

## 2013-08-13 DIAGNOSIS — K5900 Constipation, unspecified: Secondary | ICD-10-CM | POA: Diagnosis present

## 2013-08-13 DIAGNOSIS — E039 Hypothyroidism, unspecified: Secondary | ICD-10-CM | POA: Diagnosis not present

## 2013-08-13 DIAGNOSIS — K559 Vascular disorder of intestine, unspecified: Secondary | ICD-10-CM | POA: Diagnosis present

## 2013-08-13 DIAGNOSIS — I959 Hypotension, unspecified: Secondary | ICD-10-CM | POA: Diagnosis not present

## 2013-08-13 DIAGNOSIS — K4130 Unilateral femoral hernia, with obstruction, without gangrene, not specified as recurrent: Secondary | ICD-10-CM

## 2013-08-13 DIAGNOSIS — I442 Atrioventricular block, complete: Secondary | ICD-10-CM

## 2013-08-13 DIAGNOSIS — I509 Heart failure, unspecified: Secondary | ICD-10-CM | POA: Diagnosis present

## 2013-08-13 DIAGNOSIS — M6281 Muscle weakness (generalized): Secondary | ICD-10-CM | POA: Diagnosis not present

## 2013-08-13 DIAGNOSIS — G9340 Encephalopathy, unspecified: Secondary | ICD-10-CM | POA: Diagnosis not present

## 2013-08-13 DIAGNOSIS — D649 Anemia, unspecified: Secondary | ICD-10-CM | POA: Diagnosis not present

## 2013-08-13 DIAGNOSIS — Z950 Presence of cardiac pacemaker: Secondary | ICD-10-CM | POA: Diagnosis not present

## 2013-08-13 DIAGNOSIS — E860 Dehydration: Secondary | ICD-10-CM | POA: Diagnosis present

## 2013-08-13 DIAGNOSIS — Z96649 Presence of unspecified artificial hip joint: Secondary | ICD-10-CM | POA: Diagnosis not present

## 2013-08-13 DIAGNOSIS — R918 Other nonspecific abnormal finding of lung field: Secondary | ICD-10-CM | POA: Diagnosis not present

## 2013-08-13 DIAGNOSIS — K560 Paralytic ileus: Secondary | ICD-10-CM | POA: Diagnosis not present

## 2013-08-13 DIAGNOSIS — Z452 Encounter for adjustment and management of vascular access device: Secondary | ICD-10-CM | POA: Diagnosis not present

## 2013-08-13 DIAGNOSIS — J9819 Other pulmonary collapse: Secondary | ICD-10-CM | POA: Diagnosis not present

## 2013-08-13 DIAGNOSIS — Z4682 Encounter for fitting and adjustment of non-vascular catheter: Secondary | ICD-10-CM | POA: Diagnosis not present

## 2013-08-13 DIAGNOSIS — Y9210 Unspecified residential institution as the place of occurrence of the external cause: Secondary | ICD-10-CM | POA: Diagnosis not present

## 2013-08-13 DIAGNOSIS — Y838 Other surgical procedures as the cause of abnormal reaction of the patient, or of later complication, without mention of misadventure at the time of the procedure: Secondary | ICD-10-CM | POA: Diagnosis not present

## 2013-08-13 DIAGNOSIS — F0390 Unspecified dementia without behavioral disturbance: Secondary | ICD-10-CM | POA: Diagnosis present

## 2013-08-13 DIAGNOSIS — R112 Nausea with vomiting, unspecified: Secondary | ICD-10-CM | POA: Diagnosis not present

## 2013-08-13 DIAGNOSIS — R404 Transient alteration of awareness: Secondary | ICD-10-CM | POA: Diagnosis not present

## 2013-08-13 DIAGNOSIS — E876 Hypokalemia: Secondary | ICD-10-CM | POA: Diagnosis not present

## 2013-08-13 DIAGNOSIS — J9600 Acute respiratory failure, unspecified whether with hypoxia or hypercapnia: Secondary | ICD-10-CM | POA: Diagnosis not present

## 2013-08-13 DIAGNOSIS — I2510 Atherosclerotic heart disease of native coronary artery without angina pectoris: Secondary | ICD-10-CM | POA: Diagnosis present

## 2013-08-13 DIAGNOSIS — Z4889 Encounter for other specified surgical aftercare: Secondary | ICD-10-CM | POA: Diagnosis not present

## 2013-08-13 DIAGNOSIS — K4090 Unilateral inguinal hernia, without obstruction or gangrene, not specified as recurrent: Secondary | ICD-10-CM | POA: Diagnosis not present

## 2013-08-13 DIAGNOSIS — K56609 Unspecified intestinal obstruction, unspecified as to partial versus complete obstruction: Secondary | ICD-10-CM

## 2013-08-13 DIAGNOSIS — K55059 Acute (reversible) ischemia of intestine, part and extent unspecified: Secondary | ICD-10-CM | POA: Diagnosis not present

## 2013-08-13 DIAGNOSIS — J90 Pleural effusion, not elsewhere classified: Secondary | ICD-10-CM | POA: Diagnosis not present

## 2013-08-13 DIAGNOSIS — R262 Difficulty in walking, not elsewhere classified: Secondary | ICD-10-CM | POA: Diagnosis not present

## 2013-08-13 DIAGNOSIS — F0280 Dementia in other diseases classified elsewhere without behavioral disturbance: Secondary | ICD-10-CM | POA: Diagnosis not present

## 2013-08-13 DIAGNOSIS — K4030 Unilateral inguinal hernia, with obstruction, without gangrene, not specified as recurrent: Secondary | ICD-10-CM | POA: Diagnosis not present

## 2013-08-13 DIAGNOSIS — IMO0001 Reserved for inherently not codable concepts without codable children: Secondary | ICD-10-CM | POA: Diagnosis not present

## 2013-08-13 HISTORY — PX: INGUINAL HERNIA REPAIR: SHX194

## 2013-08-13 LAB — URINALYSIS, ROUTINE W REFLEX MICROSCOPIC
Bilirubin Urine: NEGATIVE
Glucose, UA: NEGATIVE mg/dL
Hgb urine dipstick: NEGATIVE
Leukocytes, UA: NEGATIVE
Specific Gravity, Urine: 1.015 (ref 1.005–1.030)
Urobilinogen, UA: 0.2 mg/dL (ref 0.0–1.0)

## 2013-08-13 LAB — SURGICAL PCR SCREEN
MRSA, PCR: NEGATIVE
Staphylococcus aureus: POSITIVE — AB

## 2013-08-13 LAB — COMPREHENSIVE METABOLIC PANEL
AST: 52 U/L — ABNORMAL HIGH (ref 0–37)
Albumin: 3.4 g/dL — ABNORMAL LOW (ref 3.5–5.2)
BUN: 41 mg/dL — ABNORMAL HIGH (ref 6–23)
CO2: 30 mEq/L (ref 19–32)
Chloride: 88 mEq/L — ABNORMAL LOW (ref 96–112)
Creatinine, Ser: 1.29 mg/dL — ABNORMAL HIGH (ref 0.50–1.10)
GFR calc Af Amer: 42 mL/min — ABNORMAL LOW (ref >90)
GFR calc non Af Amer: 36 mL/min — ABNORMAL LOW (ref >90)
Glucose, Bld: 134 mg/dL — ABNORMAL HIGH (ref 70–99)
Total Bilirubin: 0.7 mg/dL (ref 0.3–1.2)

## 2013-08-13 LAB — CBC WITH DIFFERENTIAL/PLATELET
Basophils Absolute: 0 10*3/uL (ref 0.0–0.1)
Eosinophils Relative: 0 % (ref 0–5)
HCT: 41.1 % (ref 36.0–46.0)
Hemoglobin: 14.8 g/dL (ref 12.0–15.0)
Lymphocytes Relative: 6 % — ABNORMAL LOW (ref 12–46)
Lymphs Abs: 1.2 10*3/uL (ref 0.7–4.0)
MCHC: 36 g/dL (ref 30.0–36.0)
MCV: 91.1 fL (ref 78.0–100.0)
Monocytes Absolute: 1.9 10*3/uL — ABNORMAL HIGH (ref 0.1–1.0)
Monocytes Relative: 10 % (ref 3–12)
Neutro Abs: 16.5 10*3/uL — ABNORMAL HIGH (ref 1.7–7.7)
RBC: 4.51 MIL/uL (ref 3.87–5.11)
WBC: 19.7 10*3/uL — ABNORMAL HIGH (ref 4.0–10.5)

## 2013-08-13 LAB — TYPE AND SCREEN
ABO/RH(D): A POS
Antibody Screen: NEGATIVE

## 2013-08-13 LAB — POCT I-STAT TROPONIN I: Troponin i, poc: 0.02 ng/mL (ref 0.00–0.08)

## 2013-08-13 LAB — LACTIC ACID, PLASMA: Lactic Acid, Venous: 1.1 mmol/L (ref 0.5–2.2)

## 2013-08-13 SURGERY — REPAIR, HERNIA, INGUINAL, INCARCERATED
Anesthesia: General | Site: Abdomen | Laterality: Right | Wound class: Clean Contaminated

## 2013-08-13 MED ORDER — SUCCINYLCHOLINE CHLORIDE 20 MG/ML IJ SOLN
INTRAMUSCULAR | Status: DC | PRN
  Administered 2013-08-13: 60 mg via INTRAVENOUS

## 2013-08-13 MED ORDER — SODIUM CHLORIDE 0.9 % IV SOLN: INTRAVENOUS | Status: DC

## 2013-08-13 MED ORDER — DIPHENHYDRAMINE HCL 50 MG/ML IJ SOLN
12.5000 mg | Freq: Four times a day (QID) | INTRAMUSCULAR | Status: DC | PRN
  Filled 2013-08-13: qty 0.25

## 2013-08-13 MED ORDER — HYDROMORPHONE 0.3 MG/ML IV SOLN
INTRAVENOUS | Status: AC
  Administered 2013-08-13: via INTRAVENOUS
  Filled 2013-08-13: qty 25

## 2013-08-13 MED ORDER — BUPIVACAINE-EPINEPHRINE 0.25% -1:200000 IJ SOLN
INTRAMUSCULAR | Status: DC | PRN
  Administered 2013-08-13: 10 mL

## 2013-08-13 MED ORDER — POTASSIUM CHLORIDE IN NACL 20-0.9 MEQ/L-% IV SOLN
INTRAVENOUS | Status: DC
  Administered 2013-08-13: 17:00:00 via INTRAVENOUS
  Filled 2013-08-13 (×2): qty 1000

## 2013-08-13 MED ORDER — METOCLOPRAMIDE HCL 5 MG/ML IJ SOLN: 10.0000 mg | Freq: Once | INTRAMUSCULAR | Status: AC | PRN

## 2013-08-13 MED ORDER — IOHEXOL 300 MG/ML  SOLN
80.0000 mL | Freq: Once | INTRAMUSCULAR | Status: AC | PRN
  Administered 2013-08-13: 80 mL via INTRAVENOUS

## 2013-08-13 MED ORDER — SODIUM CHLORIDE 0.9 % IV BOLUS (SEPSIS)
500.0000 mL | Freq: Once | INTRAVENOUS | Status: AC
  Administered 2013-08-13: 500 mL via INTRAVENOUS

## 2013-08-13 MED ORDER — POTASSIUM CHLORIDE 10 MEQ/100ML IV SOLN
10.0000 meq | INTRAVENOUS | Status: AC
  Administered 2013-08-13: 10 meq via INTRAVENOUS
  Filled 2013-08-13 (×4): qty 100

## 2013-08-13 MED ORDER — FENTANYL CITRATE 0.05 MG/ML IJ SOLN
INTRAMUSCULAR | Status: AC
  Filled 2013-08-13: qty 2

## 2013-08-13 MED ORDER — NEOSTIGMINE METHYLSULFATE 1 MG/ML IJ SOLN
INTRAMUSCULAR | Status: DC | PRN
  Administered 2013-08-13: 4 mg via INTRAVENOUS

## 2013-08-13 MED ORDER — DIPHENHYDRAMINE HCL 12.5 MG/5ML PO ELIX
12.5000 mg | ORAL_SOLUTION | Freq: Four times a day (QID) | ORAL | Status: DC | PRN
  Filled 2013-08-13: qty 5

## 2013-08-13 MED ORDER — PIPERACILLIN-TAZOBACTAM 3.375 G IVPB
3.3750 g | Freq: Three times a day (TID) | INTRAVENOUS | Status: DC
  Administered 2013-08-13 – 2013-08-15 (×3): 3.375 g via INTRAVENOUS
  Filled 2013-08-13 (×7): qty 50

## 2013-08-13 MED ORDER — LACTATED RINGERS IV SOLN
INTRAVENOUS | Status: DC | PRN
  Administered 2013-08-13: 21:00:00 via INTRAVENOUS

## 2013-08-13 MED ORDER — IOHEXOL 300 MG/ML  SOLN
25.0000 mL | INTRAMUSCULAR | Status: AC
  Administered 2013-08-13: 25 mL via ORAL

## 2013-08-13 MED ORDER — EPHEDRINE SULFATE 50 MG/ML IJ SOLN
INTRAMUSCULAR | Status: DC | PRN
  Administered 2013-08-13 (×2): 10 mg via INTRAVENOUS

## 2013-08-13 MED ORDER — ONDANSETRON HCL 4 MG/2ML IJ SOLN
4.0000 mg | Freq: Once | INTRAMUSCULAR | Status: AC
  Administered 2013-08-13: 4 mg via INTRAVENOUS
  Filled 2013-08-13: qty 2

## 2013-08-13 MED ORDER — ONDANSETRON HCL 4 MG/2ML IJ SOLN
4.0000 mg | Freq: Four times a day (QID) | INTRAMUSCULAR | Status: DC | PRN
  Filled 2013-08-13: qty 2

## 2013-08-13 MED ORDER — METOPROLOL TARTRATE 1 MG/ML IV SOLN: 5.0000 mg | Freq: Four times a day (QID) | INTRAVENOUS | Status: DC | PRN

## 2013-08-13 MED ORDER — PHENYLEPHRINE HCL 10 MG/ML IJ SOLN
10.0000 mg | INTRAVENOUS | Status: DC | PRN
  Administered 2013-08-13: 25 ug/min via INTRAVENOUS

## 2013-08-13 MED ORDER — HYDROMORPHONE 0.3 MG/ML IV SOLN
INTRAVENOUS | Status: DC
  Administered 2013-08-14: 1.19 mg via INTRAVENOUS
  Administered 2013-08-14: 0.4 mg via INTRAVENOUS
  Administered 2013-08-14: 0.599 mg via INTRAVENOUS
  Administered 2013-08-14 (×2): 0.2 mg via INTRAVENOUS

## 2013-08-13 MED ORDER — MORPHINE SULFATE 2 MG/ML IJ SOLN: 2.0000 mg | INTRAMUSCULAR | Status: DC | PRN

## 2013-08-13 MED ORDER — SODIUM CHLORIDE 0.9 % IJ SOLN: 9.0000 mL | INTRAMUSCULAR | Status: DC | PRN

## 2013-08-13 MED ORDER — NALOXONE HCL 0.4 MG/ML IJ SOLN
0.4000 mg | INTRAMUSCULAR | Status: DC | PRN
  Administered 2013-08-14: 0.4 mg via INTRAVENOUS
  Filled 2013-08-13 (×2): qty 1

## 2013-08-13 MED ORDER — ONDANSETRON HCL 4 MG/2ML IJ SOLN
INTRAMUSCULAR | Status: DC | PRN
  Administered 2013-08-13: 4 mg via INTRAVENOUS

## 2013-08-13 MED ORDER — BUPIVACAINE-EPINEPHRINE (PF) 0.5% -1:200000 IJ SOLN
INTRAMUSCULAR | Status: AC
  Filled 2013-08-13: qty 10

## 2013-08-13 MED ORDER — GLYCOPYRROLATE 0.2 MG/ML IJ SOLN
INTRAMUSCULAR | Status: DC | PRN
  Administered 2013-08-13: 0.6 mg via INTRAVENOUS

## 2013-08-13 MED ORDER — LEVOTHYROXINE SODIUM 100 MCG IV SOLR
68.5000 ug | Freq: Every day | INTRAVENOUS | Status: DC
  Administered 2013-08-13 – 2013-08-19 (×7): 68.5 ug via INTRAVENOUS
  Filled 2013-08-13 (×8): qty 5

## 2013-08-13 MED ORDER — HYDROMORPHONE HCL PF 1 MG/ML IJ SOLN
0.5000 mg | Freq: Once | INTRAMUSCULAR | Status: AC
  Administered 2013-08-13: 0.5 mg via INTRAVENOUS
  Filled 2013-08-13: qty 1

## 2013-08-13 MED ORDER — PROPOFOL 10 MG/ML IV BOLUS
INTRAVENOUS | Status: DC | PRN
  Administered 2013-08-13: 100 mg via INTRAVENOUS

## 2013-08-13 MED ORDER — PANTOPRAZOLE SODIUM 40 MG IV SOLR
40.0000 mg | Freq: Once | INTRAVENOUS | Status: AC
  Administered 2013-08-13: 40 mg via INTRAVENOUS
  Filled 2013-08-13: qty 40

## 2013-08-13 MED ORDER — 0.9 % SODIUM CHLORIDE (POUR BTL) OPTIME
TOPICAL | Status: DC | PRN
  Administered 2013-08-13: 1000 mL

## 2013-08-13 MED ORDER — LIDOCAINE HCL (CARDIAC) 20 MG/ML IV SOLN
INTRAVENOUS | Status: DC | PRN
  Administered 2013-08-13: 60 mg via INTRAVENOUS

## 2013-08-13 MED ORDER — DEXTROSE-NACL 5-0.9 % IV SOLN
INTRAVENOUS | Status: DC
  Administered 2013-08-15: 08:00:00 via INTRAVENOUS

## 2013-08-13 MED ORDER — FENTANYL CITRATE 0.05 MG/ML IJ SOLN
INTRAMUSCULAR | Status: DC | PRN
  Administered 2013-08-13: 25 ug via INTRAVENOUS
  Administered 2013-08-13: 100 ug via INTRAVENOUS

## 2013-08-13 MED ORDER — ROCURONIUM BROMIDE 100 MG/10ML IV SOLN
INTRAVENOUS | Status: DC | PRN
  Administered 2013-08-13: 50 mg via INTRAVENOUS

## 2013-08-13 MED ORDER — ONDANSETRON HCL 4 MG/2ML IJ SOLN
4.0000 mg | Freq: Four times a day (QID) | INTRAMUSCULAR | Status: DC
  Administered 2013-08-13 – 2013-08-23 (×33): 4 mg via INTRAVENOUS
  Filled 2013-08-13 (×33): qty 2

## 2013-08-13 MED ORDER — FENTANYL CITRATE 0.05 MG/ML IJ SOLN
25.0000 ug | INTRAMUSCULAR | Status: DC | PRN
  Administered 2013-08-13 (×2): 50 ug via INTRAVENOUS

## 2013-08-13 SURGICAL SUPPLY — 69 items
BENZOIN TINCTURE PRP APPL 2/3 (GAUZE/BANDAGES/DRESSINGS) ×2 IMPLANT
BIOPATCH RED 1 DISK 7.0 (GAUZE/BANDAGES/DRESSINGS) ×2 IMPLANT
BLADE SURG 15 STRL LF DISP TIS (BLADE) ×1 IMPLANT
BLADE SURG 15 STRL SS (BLADE) ×1
BLADE SURG ROTATE 9660 (MISCELLANEOUS) ×2 IMPLANT
CHLORAPREP W/TINT 26ML (MISCELLANEOUS) ×2 IMPLANT
COVER SURGICAL LIGHT HANDLE (MISCELLANEOUS) ×2 IMPLANT
DECANTER SPIKE VIAL GLASS SM (MISCELLANEOUS) ×2 IMPLANT
DERMABOND ADVANCED (GAUZE/BANDAGES/DRESSINGS) ×1
DERMABOND ADVANCED .7 DNX12 (GAUZE/BANDAGES/DRESSINGS) ×1 IMPLANT
DRAIN PENROSE 1/2X12 LTX STRL (WOUND CARE) IMPLANT
DRAPE LAPAROSCOPIC ABDOMINAL (DRAPES) ×2 IMPLANT
DRAPE LAPAROTOMY TRNSV 102X78 (DRAPE) IMPLANT
DRAPE SLUSH/WARMER DISC (DRAPES) ×2 IMPLANT
DRAPE UTILITY 15X26 W/TAPE STR (DRAPE) ×8 IMPLANT
DRSG TEGADERM 4X4.75 (GAUZE/BANDAGES/DRESSINGS) IMPLANT
ELECT CAUTERY BLADE 6.4 (BLADE) ×2 IMPLANT
ELECT REM PT RETURN 9FT ADLT (ELECTROSURGICAL) ×2
ELECTRODE REM PT RTRN 9FT ADLT (ELECTROSURGICAL) ×1 IMPLANT
EVACUATOR 1/8 PVC DRAIN (DRAIN) ×2 IMPLANT
EVACUATOR SILICONE 100CC (DRAIN) ×2 IMPLANT
GAUZE SPONGE 4X4 16PLY XRAY LF (GAUZE/BANDAGES/DRESSINGS) ×2 IMPLANT
GLOVE BIO SURGEON STRL SZ7.5 (GLOVE) ×2 IMPLANT
GLOVE BIOGEL PI IND STRL 7.5 (GLOVE) ×1 IMPLANT
GLOVE BIOGEL PI IND STRL 8 (GLOVE) ×1 IMPLANT
GLOVE BIOGEL PI INDICATOR 7.5 (GLOVE) ×1
GLOVE BIOGEL PI INDICATOR 8 (GLOVE) ×1
GLOVE SURG SS PI 7.5 STRL IVOR (GLOVE) ×2 IMPLANT
GOWN STRL NON-REIN LRG LVL3 (GOWN DISPOSABLE) ×4 IMPLANT
GOWN STRL REIN XL XLG (GOWN DISPOSABLE) ×2 IMPLANT
KIT BASIN OR (CUSTOM PROCEDURE TRAY) ×2 IMPLANT
KIT ROOM TURNOVER OR (KITS) ×2 IMPLANT
NEEDLE HYPO 25GX1X1/2 BEV (NEEDLE) ×2 IMPLANT
NS IRRIG 1000ML POUR BTL (IV SOLUTION) ×2 IMPLANT
PACK SURGICAL SETUP 50X90 (CUSTOM PROCEDURE TRAY) ×2 IMPLANT
PAD ARMBOARD 7.5X6 YLW CONV (MISCELLANEOUS) ×2 IMPLANT
PENCIL BUTTON HOLSTER BLD 10FT (ELECTRODE) ×2 IMPLANT
RELOAD PROXIMATE 75MM BLUE (ENDOMECHANICALS) ×2 IMPLANT
SPECIMEN JAR SMALL (MISCELLANEOUS) IMPLANT
SPONGE GAUZE 4X4 12PLY (GAUZE/BANDAGES/DRESSINGS) ×2 IMPLANT
SPONGE INTESTINAL PEANUT (DISPOSABLE) IMPLANT
STAPLER PROXIMATE 75MM BLUE (STAPLE) ×4 IMPLANT
STAPLER VISISTAT (STAPLE) ×2 IMPLANT
STRIP CLOSURE SKIN 1/2X4 (GAUZE/BANDAGES/DRESSINGS) IMPLANT
SUT ETHILON 3 0 FSL (SUTURE) ×2 IMPLANT
SUT MNCRL AB 4-0 PS2 18 (SUTURE) ×2 IMPLANT
SUT NOVA NAB GS-22 2 0 T19 (SUTURE) ×10 IMPLANT
SUT PDS AB 0 CT 36 (SUTURE) IMPLANT
SUT PROLENE 2 0 SH DA (SUTURE) ×2 IMPLANT
SUT SILK 0 TIES 10X30 (SUTURE) ×2 IMPLANT
SUT SILK 2 0 SH (SUTURE) IMPLANT
SUT SILK 3 0 (SUTURE) ×1
SUT SILK 3 0 SH CR/8 (SUTURE) ×2 IMPLANT
SUT SILK 3-0 18XBRD TIE 12 (SUTURE) ×1 IMPLANT
SUT VIC AB 0 CT2 27 (SUTURE) IMPLANT
SUT VIC AB 2-0 SH 27 (SUTURE) ×2
SUT VIC AB 2-0 SH 27X BRD (SUTURE) ×1 IMPLANT
SUT VIC AB 2-0 SH 27XBRD (SUTURE) ×1 IMPLANT
SUT VIC AB 3-0 SH 27 (SUTURE) ×5
SUT VIC AB 3-0 SH 27XBRD (SUTURE) ×5 IMPLANT
SUT VICRYL AB 3 0 TIES (SUTURE) ×2 IMPLANT
SYR BULB IRRIGATION 50ML (SYRINGE) ×2 IMPLANT
SYR CONTROL 10ML LL (SYRINGE) ×2 IMPLANT
TAPE CLOTH SURG 4X10 WHT LF (GAUZE/BANDAGES/DRESSINGS) ×2 IMPLANT
TOWEL OR 17X24 6PK STRL BLUE (TOWEL DISPOSABLE) ×2 IMPLANT
TOWEL OR 17X26 10 PK STRL BLUE (TOWEL DISPOSABLE) ×2 IMPLANT
TRAY FOLEY CATH 14FRSI W/METER (CATHETERS) ×2 IMPLANT
TUBE CONNECTING 12X1/4 (SUCTIONS) IMPLANT
YANKAUER SUCT BULB TIP NO VENT (SUCTIONS) IMPLANT

## 2013-08-13 NOTE — Op Note (Signed)
08/13/2013  11:10 PM  PATIENT:  [name]  79 y.o. female  PRE-OPERATIVE DIAGNOSIS:  Incarcerated Inguinal Hernia  POST-OPERATIVE DIAGNOSIS:  Strangulated femoral Hernia, an ischemic bowel  PROCEDURE:  Procedure(s): Primary tissue repair of a right strangulated femoral hernia, small bowel resection which consisted of approximately 10 cm and anastomosis  SURGEON:  Surgeon(s) and Role:    * Axel Filler, MD - Primary  PHYSICIAN ASSISTANT:   ASSISTANTS: none   ANESTHESIA:   general  EBL:  Total I/O In: 867 [I.V.:500; Blood:317; IV Piggyback:50] Out: 465 [Urine:265; Other:200]  BLOOD ADMINISTERED:1 unit PLTS  DRAINS: (1) Jackson-Pratt drain(s) with closed bulb suction lateral to her RIHR incision   LOCAL MEDICATIONS USED:  MARCAINE     SPECIMEN:  Source of Specimen:  Small bowel and incarcerated omentum  DISPOSITION OF SPECIMEN:  PATHOLOGY  COUNTS:  YES  TOURNIQUET:  * No tourniquets in log *  Details of procedure: The patient was consented the patient was taken back to the operating room patient was then placed in the supine position bilateral SCDs in place.after appropriate antibiotics were confirmed, a timeout was called and all facts were verified.The patient was then prepped and draped in a sterile fashion.  A right inguinal incision was made in the standard fashion. Bovie cautery was used to maintain hemostasis dissection was taken down to the anterior fascia. The anterior fascia was incised laterally with a stab incision. The external oblique was elevated dissected away from surrounding tissue. At this time it was noted that the hernia was a femoral hernia. Multiple attempts were made to reduce the hernia were unsuccessful. At this time I proceeded to incise the internal oblique muscles. At this time I was in the preperitoneal space. I didn't try to milk back the hernia through the femoral defect but was unsuccessful. At this time I entered the peritoneum  sharply with Metzenbaum scissors. Again I was unsuccessful in reducing the femoral hernia. I proceeded to incise the ileal inguinal ligament to help reduce the hernia, and was successful. Upon reduction of the hernia contents it was noticed there was a portion of ischemic small bowel and incarcerated omentum.  At this time I proceeded toResect the portion of ischemic bowel which compromised approximately 10 cm. This was done with a 75 GIA stapler in a standard fashion. An anastomosis was undertaken using a firing of the 75 GIA stapler to create a common channel after an enterotomy was made at the stapling device. The remaining enterotomy was closed using a second firing of the 75 GIA stapler. The anastomosis was patent. At this time revealed silk stitches were used in a figure-of-eight fashion to reapproximate the mesentery. The anastomosis was viable and pink at the end of the anastomosis. This portion of bowel seemed to be the mid jejunum. The distal bowel was decompressed. At this time this was placed back into the abdomen.  I proceeded to close the peritoneum with 3-0 Vicryl in a running fashion. The internal oblique was then reapproximated using 3-0 Vicryl in a running fashion. The external oblique was then reapproximated using a 3-0 Vicryl in a running fashion. I then proceeded to approximate the ileal inguinal ligament to Cooper's ligament using interrupted 2-0 Novafil stitches in a successive fashion. This obliterated the femoral defect. The subcutaneous femoral space was a large and a 7 mm flat JP drain was placed into this just lateral to the initial incision. This anchored to the skin using 3-0 nylon. Scarpa's fascia was reapproximated  using a 3-0 Vicryl in a running fashion. Skin was then reapproximated using skin staples. The wound center as well as 4x4 gauze and tape.    The patient was then taken to the recovery room in stable condition.   PLAN OF CARE: Admit to inpatient   PATIENT  DISPOSITION:  PACU - hemodynamically stable.   Delay start of Pharmacological VTE agent (>24hrs) due to surgical blood loss or risk of bleeding: no

## 2013-08-13 NOTE — ED Provider Notes (Signed)
CSN: [id number]     Arrival date & time 08/13/13  1127 History   First MD Initiated Contact with Patient 08/13/13 1148     Chief Complaint  Patient presents with  . Nausea  . Emesis   (Consider location/radiation/quality/duration/timing/severity/associated sxs/prior Treatment) Patient is a 79 y.o. female presenting with vomiting. The history is provided by the patient (the pt has been vomiting since sat.   some abd pain).  Emesis Severity:  Moderate Timing:  Constant Quality:  Malodorous material Feeding tolerance: nothing. Progression:  Unchanged Chronicity:  New Recent urination:  Decreased Associated symptoms: no abdominal pain, no diarrhea and no headaches     Past Medical History  Diagnosis Date  . Aphasia 01/09/2009  . 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 CEREBROVASCULAR ACCIDENT, HX OF 11/20/2007  . CVA 01/09/2009  . DEMENTIA 05/19/2008  . DIVERTICULOSIS, COLON 11/20/2007  . FATIGUE 11/20/2007  . GLUCOSE INTOLERANCE 12/06/2010  . HYPERLIPIDEMIA 11/20/2007  . HYPERTENSION 11/20/2007    Echo was done 07/07/11: mod LVH, EF 55-60%, grade 1 diast dysfxn, mild MR, mild LAE, mod TR, PASP 39  . HYPOTHYROIDISM 11/20/2007  . IRON DEFICIENCY 12/06/2010  . PACEMAKER, PERMANENT 01/09/2009  . TIA 05/14/2003    elevated CE's in setting of TIA and falls in 10/12 - echo normal with no further cardiac w/u pursued  . Urinary incontinence 01/09/2009  . Impaired glucose tolerance 06/03/2011  . TIA (transient ischemic attack) 10/10/2011  . Dementia   . CHF (congestive heart failure)   . Coronary artery disease   . Arthritis     right hand  . Pacemaker 2002  . Femoral fracture 07/01/2013   Past Surgical History  Procedure Laterality Date  . Medtronic cynthia dual chamber pacemaker serial number was [id number]...04/15/2009    . Abdominal hysterectomy    . S/p cerebral aneurysm  1991  . S/p pacemaker ddd    . Cholecystectomy    . S/p retinal attachment    . S/p thyroidectomy    . Cystocele repair  N/A 11/26/2012    Procedure: Cystocele Repair with Lefort Colpocleisis.  colpectomy Levatorplasty Perineorraphy;  Surgeon: Lenoard Aden, MD;  Location: [facility];  Service: Gynecology;  Laterality: N/A;  Cystocele Repair with Lefort Colpocleisis.  . Hip arthroplasty Left 07/02/2013    Procedure: ARTHROPLASTY BIPOLAR HIP;  Surgeon: Shelda Pal, MD;  Location: [facility];  Service: Orthopedics;  Laterality: Left;   Family History  Problem Relation Age of Onset  . Cancer Mother     breast cancer   History  Substance Use Topics  . Smoking status: Never Smoker   . Smokeless tobacco: Never Used  . Alcohol Use: No   OB History   Grav Para Term Preterm Abortions TAB SAB Ect Mult Living                 Review of Systems  Constitutional: Negative for appetite change and fatigue.  HENT: Negative for congestion, ear discharge and sinus pressure.   Eyes: Negative for discharge.  Respiratory: Negative for cough.   Cardiovascular: Negative for chest pain.  Gastrointestinal: Positive for vomiting. Negative for abdominal pain and diarrhea.  Genitourinary: Negative for frequency and hematuria.  Musculoskeletal: Negative for back pain.  Skin: Negative for rash.  Neurological: Negative for seizures and headaches.  Psychiatric/Behavioral: Negative for hallucinations.    Allergies  Codeine  Home Medications   Current Outpatient Rx  Name  Route  Sig  Dispense  Refill  . acetaminophen (TYLENOL) 325 MG  tablet   Oral   Take 2 tablets (650 mg total) by mouth every 6 (six) hours as needed.         Marland Kitchen amLODipine (NORVASC) 10 MG tablet   Oral   Take 1 tablet (10 mg total) by mouth daily. Resume in 3 days if SBP > 150   90 tablet   3   . atorvastatin (LIPITOR) 10 MG tablet   Oral   Take 1 tablet (10 mg total) by mouth daily.   90 tablet   3   . Calcium Carbonate-Vitamin D (CALTRATE 600+D) 600-400 MG-UNIT per tablet   Oral   Take 1 tablet by mouth daily.           Marland Kitchen dipyridamole-aspirin  (AGGRENOX) 200-25 MG per 12 hr capsule   Oral   Take 1 capsule by mouth 2 (two) times daily.   180 capsule   3   . docusate sodium 100 MG CAPS   Oral   Take 100 mg by mouth 2 (two) times daily.   10 capsule   0   . donepezil (ARICEPT) 10 MG tablet   Oral   Take 1 tablet (10 mg total) by mouth daily.   90 tablet   3   . ferrous sulfate 325 (65 FE) MG tablet   Oral   Take 1 tablet (325 mg total) by mouth 3 (three) times daily after meals.      3   . levothyroxine (SYNTHROID, LEVOTHROID) 137 MCG tablet   Oral   Take 137 mcg by mouth daily before breakfast.         . losartan (COZAAR) 100 MG tablet   Oral   Take 100 mg by mouth daily.         . methocarbamol (ROBAXIN) 500 MG tablet   Oral   Take 1 tablet (500 mg total) by mouth every 6 (six) hours as needed (muscle spasms.).   20 tablet   0   . Multiple Vitamin (MULTIVITAMIN WITH MINERALS) TABS   Oral   Take 1 tablet by mouth daily. Centrum silver         . polyethylene glycol (MIRALAX / GLYCOLAX) packet   Oral   Take 17 g by mouth 2 (two) times daily.   14 each   0   . traMADol (ULTRAM) 50 MG tablet   Oral   Take 1-2 tablets (50-100 mg total) by mouth every 6 (six) hours as needed for pain.   80 tablet   0    BP 156/73  Pulse 80  Temp(Src) 98.1 F (36.7 C) (Oral)  Resp 22  SpO2 94% Physical Exam  Constitutional: She is oriented to person, place, and time. She appears well-developed.  HENT:  Head: Normocephalic.  Eyes: Conjunctivae and EOM are normal. No scleral icterus.  Neck: Neck supple. No thyromegaly present.  Cardiovascular: Normal rate and regular rhythm.  Exam reveals no gallop and no friction rub.   No murmur heard. Pulmonary/Chest: No stridor. She has no wheezes. She has no rales. She exhibits no tenderness.  Abdominal: She exhibits no distension. There is tenderness. There is no rebound.  Tender rlq,    Musculoskeletal: Normal range of motion. She exhibits no edema.   Lymphadenopathy:    She has no cervical adenopathy.  Neurological: She is oriented to person, place, and time. She exhibits normal muscle tone. Coordination normal.  Skin: No rash noted. No erythema.  Psychiatric: She has a normal mood and affect. Her behavior  is normal.    ED Course  Procedures (including critical care time) Labs Review Labs Reviewed  CBC WITH DIFFERENTIAL - Abnormal; Notable for the following:    WBC 19.7 (*)    Neutrophils Relative % 84 (*)    Neutro Abs 16.5 (*)    Lymphocytes Relative 6 (*)    Monocytes Absolute 1.9 (*)    All other components within normal limits  COMPREHENSIVE METABOLIC PANEL - Abnormal; Notable for the following:    Sodium 132 (*)    Potassium 3.2 (*)    Chloride 88 (*)    Glucose, Bld 134 (*)    BUN 41 (*)    Creatinine, Ser 1.29 (*)    Albumin 3.4 (*)    AST 52 (*)    GFR calc non Af Amer 36 (*)    GFR calc Af Amer 42 (*)    All other components within normal limits  LIPASE, BLOOD  LACTIC ACID, PLASMA  URINALYSIS, ROUTINE W REFLEX MICROSCOPIC  POCT I-STAT TROPONIN I  TYPE AND SCREEN   Imaging Review Ct Abdomen Pelvis W Contrast  08/13/2013   CLINICAL DATA:  Nausea, vomiting  EXAM: CT ABDOMEN AND PELVIS WITH CONTRAST  TECHNIQUE: Multidetector CT imaging of the abdomen and pelvis was performed using the standard protocol following bolus administration of intravenous contrast.  CONTRAST:  80mL OMNIPAQUE IOHEXOL 300 MG/ML  SOLN  COMPARISON:  None.  FINDINGS: The lung bases are clear.  The liver demonstrates no focal abnormality. There is no intrahepatic or extrahepatic biliary ductal dilatation. The gallbladder is surgically absent. The spleen demonstrates no focal abnormality. The adrenal glands and pancreas are normal. There are multiple small hypodensities measuring less than 1 cm bilaterally, left greater than right, which are too small to characterize. The bladder is unremarkable.  There are multiple distended loops of small bowel  with multiple air air-fluid levels. The stomach is distended and fluid-filled. There is a right inguinal hernia containing a dilated afferent loop and a decompressed efferent loop consistent with the site of transition. There is no pneumoperitoneum, pneumatosis, or portal venous gas. There is a small amount of pelvic free fluid. There is no lymphadenopathy.  The abdominal aorta is normal in caliber with atherosclerosis.  There are no lytic or sclerotic osseous lesions. There is a left hip arthroplasty.  IMPRESSION: High-grade small bowel obstruction resulting from a right inguinal hernia.   Electronically Signed   By: Elige Ko   On: 08/13/2013 15:03    EKG Interpretation   None       MDM  No diagnosis found. Sbo,  Incarcerated hernia    Benny Lennert, MD 08/13/13 [phone]

## 2013-08-13 NOTE — Anesthesia Preprocedure Evaluation (Signed)
Anesthesia Evaluation  Patient identified by MRN, date of birth, ID band Patient awake    Reviewed: Allergy & Precautions, H&P , NPO status , Patient's Chart, lab work & pertinent test results, reviewed documented beta blocker date and time   Airway Mallampati: II TM Distance: >3 FB Neck ROM: full    Dental   Pulmonary pneumonia -, resolved,  breath sounds clear to auscultation        Cardiovascular hypertension, + CAD, + Peripheral Vascular Disease and +CHF + dysrhythmias Atrial Fibrillation + pacemaker Rhythm:regular     Neuro/Psych PSYCHIATRIC DISORDERS TIACVA, Residual Symptoms    GI/Hepatic Neg liver ROS, GERD-  Medicated and Controlled,  Endo/Other  Hypothyroidism   Renal/GU negative Renal ROS  negative genitourinary   Musculoskeletal   Abdominal   Peds  Hematology negative hematology ROS (+) anemia ,   Anesthesia Other Findings See surgeon's H&P   Reproductive/Obstetrics negative OB ROS                           Anesthesia Physical Anesthesia Plan  ASA: IV and emergent  Anesthesia Plan: General   Post-op Pain Management:    Induction: Intravenous, Rapid sequence and Cricoid pressure planned  Airway Management Planned: Oral ETT  Additional Equipment:   Intra-op Plan:   Post-operative Plan: Extubation in OR  Informed Consent: I have reviewed the patients History and Physical, chart, labs and discussed the procedure including the risks, benefits and alternatives for the proposed anesthesia with the patient or authorized representative who has indicated his/her understanding and acceptance.   Dental Advisory Given  Plan Discussed with: CRNA and Surgeon  Anesthesia Plan Comments:         Anesthesia Quick Evaluation

## 2013-08-13 NOTE — Consult Note (Signed)
Reason for Consult: high grade small bowel obstruction  Referring Physician: Dr. Bethann Berkshire   HPI: [name] is a 79 year old female with a history of recurrent CVA with residual dysarthria discharged on 07/01/13, AV heart block s/p pacemaker, mild dementia, HLD, HTN, hypothyroidism, CHF, CAD, who lives at [place].  She presented to [facility] today with nausea, vomiting and abdominal pain.  Duration of symptoms is 3 days.  Onset was sudden. Coarse is worsened.  Associated with constipation, last BM was 5 days ago, malaise, weakness.  Last oral intake was 2-3 days ago.  Past surgeries include a abdominal hysterectomy, appendectomy and cholecystectomy.  She takes Aggrenox, no other anticoagulation.  Her white count is 19.7K.  Lactic acid 1.1.  Negative troponin, EKG without acute findings.  Potassium 3.2 and acute kidney injury with a sCr 1.29.    Past Medical History  Diagnosis Date  . Aphasia 01/09/2009  . 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 CEREBROVASCULAR ACCIDENT, HX OF 11/20/2007  . CVA 01/09/2009  . DEMENTIA 05/19/2008  . DIVERTICULOSIS, COLON 11/20/2007  . FATIGUE 11/20/2007  . GLUCOSE INTOLERANCE 12/06/2010  . HYPERLIPIDEMIA 11/20/2007  . HYPERTENSION 11/20/2007    Echo was done 07/07/11: mod LVH, EF 55-60%, grade 1 diast dysfxn, mild MR, mild LAE, mod TR, PASP 39  . HYPOTHYROIDISM 11/20/2007  . IRON DEFICIENCY 12/06/2010  . PACEMAKER, PERMANENT 01/09/2009  . TIA 05/14/2003    elevated CE's in setting of TIA and falls in 10/12 - echo normal with no further cardiac w/u pursued  . Urinary incontinence 01/09/2009  . Impaired glucose tolerance 06/03/2011  . TIA (transient ischemic attack) 10/10/2011  . Dementia   . CHF (congestive heart failure)   . Coronary artery disease   . Arthritis     right hand  . Pacemaker 2002  . Femoral fracture 07/01/2013    Past Surgical History  Procedure Laterality Date  . Medtronic cynthia dual chamber pacemaker serial number was  [id number]...04/15/2009    . Abdominal hysterectomy    . S/p cerebral aneurysm  1991  . S/p pacemaker ddd    . Cholecystectomy    . S/p retinal attachment    . S/p thyroidectomy    . Cystocele repair N/A 11/26/2012    Procedure: Cystocele Repair with Lefort Colpocleisis.  colpectomy Levatorplasty Perineorraphy;  Surgeon: Lenoard Aden, MD;  Location: [facility];  Service: Gynecology;  Laterality: N/A;  Cystocele Repair with Lefort Colpocleisis.  . Hip arthroplasty Left 07/02/2013    Procedure: ARTHROPLASTY BIPOLAR HIP;  Surgeon: Shelda Pal, MD;  Location: [facility];  Service: Orthopedics;  Laterality: Left;    Family History  Problem Relation Age of Onset  . Cancer Mother     breast cancer    Social History:  reports that she has never smoked. She has never used smokeless tobacco. She reports that she does not drink alcohol or use illicit drugs.  Allergies:  Allergies  Allergen Reactions  . Codeine     unknown    Medications: I have reviewed the patient's current medications.  Results for orders placed during the hospital encounter of 08/13/13 (from the past 48 hour(s))  CBC WITH DIFFERENTIAL     Status: Abnormal   Collection Time    08/13/13 11:55 AM      Result Value Range   WBC 19.7 (*) 4.0 - 10.5 K/uL   RBC 4.51  3.87 - 5.11 MIL/uL   Hemoglobin 14.8  12.0 - 15.0 g/dL  HCT 41.1  36.0 - 46.0 %   MCV 91.1  78.0 - 100.0 fL   MCH 32.8  26.0 - 34.0 pg   MCHC 36.0  30.0 - 36.0 g/dL   RDW 69.6  29.5 - 28.4 %   Platelets 309  150 - 400 K/uL   Neutrophils Relative % 84 (*) 43 - 77 %   Neutro Abs 16.5 (*) 1.7 - 7.7 K/uL   Lymphocytes Relative 6 (*) 12 - 46 %   Lymphs Abs 1.2  0.7 - 4.0 K/uL   Monocytes Relative 10  3 - 12 %   Monocytes Absolute 1.9 (*) 0.1 - 1.0 K/uL   Eosinophils Relative 0  0 - 5 %   Eosinophils Absolute 0.0  0.0 - 0.7 K/uL   Basophils Relative 0  0 - 1 %   Basophils Absolute 0.0  0.0 - 0.1 K/uL  COMPREHENSIVE METABOLIC PANEL     Status: Abnormal    Collection Time    08/13/13 11:55 AM      Result Value Range   Sodium 132 (*) 135 - 145 mEq/L   Potassium 3.2 (*) 3.5 - 5.1 mEq/L   Chloride 88 (*) 96 - 112 mEq/L   CO2 30  19 - 32 mEq/L   Glucose, Bld 134 (*) 70 - 99 mg/dL   BUN 41 (*) 6 - 23 mg/dL   Creatinine, Ser 1.32 (*) 0.50 - 1.10 mg/dL   Calcium 9.0  8.4 - 44.0 mg/dL   Total Protein 6.6  6.0 - 8.3 g/dL   Albumin 3.4 (*) 3.5 - 5.2 g/dL   AST 52 (*) 0 - 37 U/L   ALT 32  0 - 35 U/L   Alkaline Phosphatase 88  39 - 117 U/L   Total Bilirubin 0.7  0.3 - 1.2 mg/dL   GFR calc non Af Amer 36 (*) >90 mL/min   GFR calc Af Amer 42 (*) >90 mL/min   Comment: (NOTE)     The eGFR has been calculated using the CKD EPI equation.     This calculation has not been validated in all clinical situations.     eGFR's persistently <90 mL/min signify possible Chronic Kidney     Disease.  LIPASE, BLOOD     Status: None   Collection Time    08/13/13 11:55 AM      Result Value Range   Lipase 16  11 - 59 U/L  POCT I-STAT TROPONIN I     Status: None   Collection Time    08/13/13 12:11 PM      Result Value Range   Troponin i, poc 0.02  0.00 - 0.08 ng/mL   Comment 3            Comment: Due to the release kinetics of cTnI,     a negative result within the first hours     of the onset of symptoms does not rule out     myocardial infarction with certainty.     If myocardial infarction is still suspected,     repeat the test at appropriate intervals.  LACTIC ACID, PLASMA     Status: None   Collection Time    08/13/13  1:30 PM      Result Value Range   Lactic Acid, Venous 1.1  0.5 - 2.2 mmol/L    Ct Abdomen Pelvis W Contrast  08/13/2013   CLINICAL DATA:  Nausea, vomiting  EXAM: CT ABDOMEN AND PELVIS WITH CONTRAST  TECHNIQUE: Multidetector CT imaging of the abdomen and pelvis was performed using the standard protocol following bolus administration of intravenous contrast.  CONTRAST:  80mL OMNIPAQUE IOHEXOL 300 MG/ML  SOLN  COMPARISON:  None.   FINDINGS: The lung bases are clear.  The liver demonstrates no focal abnormality. There is no intrahepatic or extrahepatic biliary ductal dilatation. The gallbladder is surgically absent. The spleen demonstrates no focal abnormality. The adrenal glands and pancreas are normal. There are multiple small hypodensities measuring less than 1 cm bilaterally, left greater than right, which are too small to characterize. The bladder is unremarkable.  There are multiple distended loops of small bowel with multiple air air-fluid levels. The stomach is distended and fluid-filled. There is a right inguinal hernia containing a dilated afferent loop and a decompressed efferent loop consistent with the site of transition. There is no pneumoperitoneum, pneumatosis, or portal venous gas. There is a small amount of pelvic free fluid. There is no lymphadenopathy.  The abdominal aorta is normal in caliber with atherosclerosis.  There are no lytic or sclerotic osseous lesions. There is a left hip arthroplasty.  IMPRESSION: High-grade small bowel obstruction resulting from a right inguinal hernia.   Electronically Signed   By: Elige Ko   On: 08/13/2013 15:03    Review of Systems  Constitutional: Positive for malaise/fatigue. Negative for fever, chills and diaphoresis.  Eyes: Negative for photophobia.  Respiratory: Negative for shortness of breath and wheezing.   Cardiovascular: Negative for chest pain, palpitations and leg swelling.  Gastrointestinal: Positive for nausea, vomiting, abdominal pain and constipation. Negative for diarrhea, blood in stool and melena.  Genitourinary: Positive for dysuria. Negative for urgency and hematuria.  Neurological: Positive for weakness. Negative for dizziness, tingling, tremors, loss of consciousness and headaches.  Psychiatric/Behavioral: Negative for depression.   Blood pressure 156/73, pulse 80, temperature 98.1 F (36.7 C), temperature source Oral, resp. rate 22, SpO2  94.00%. Physical Exam  Constitutional: She is oriented to person, place, and time. She appears well-developed and well-nourished. She appears distressed.  HENT:  Head: Normocephalic and atraumatic.  Mouth/Throat: No oropharyngeal exudate.  Dry mucus membranes   Cardiovascular: Normal rate, regular rhythm, normal heart sounds and intact distal pulses.  Exam reveals no gallop and no friction rub.   No murmur heard. Respiratory: Effort normal and breath sounds normal. No respiratory distress. She has no wheezes. She exhibits no tenderness.  GI: Soft. Bowel sounds are normal. There is tenderness. There is no rebound and no guarding.  Incarcerated right inguinal hernia, tender to palpation   Musculoskeletal: She exhibits no edema.  Neurological: She is alert and oriented to person, place, and time.  Skin: Skin is warm and dry. No rash noted. She is not diaphoretic. No erythema. No pallor.  Psychiatric: She has a normal mood and affect. Her behavior is normal. Judgment and thought content normal.    Assessment/Plan: High grade small bowel obstruction secondary to right inguinal hernia -admit to ICU -NPO -place NGT -IV fluids -pain control -we will proceed with surgery this evening.  I explained the risks of the surgery with the patient, she verbalizes understanding.  We will speak with her son [name] when he arrives to the hospital.   -type and screen -transfuse platelets  -zosyn Acute Kidney injury -obtain a UA to rule out infection, but likely from dehydration  -IV hydration  Hx CVA -hold aggrenox Hypothyroidism -levothyroxine 68. IV CAD, CHF, AVB s/p pacemaker -Monitor for fluid overload Hypertension -Metoprolol PRN Dementia, mild -  hold aricept until taking po  Ralphine Hinks ANP-BC 08/13/2013, 4:19 PM

## 2013-08-13 NOTE — ED Notes (Addendum)
Pt reports to the ED for eval of N/V since Saturday night hrs as well as abnormal labs. Per family the emesis is brown. KBU performed at the SNF did not show a blockage. Pt had similar symptoms after recent hip replacement surgery and was dx with fecal impaction. Denies any diarrhea reports some constipation. Pt reports last normal was "a while ago." Denies any SOB. Generalized abdominal tenderness noted. Pt comes [employer] via [place]. No coffee ground emesis or gross blood in her emesis. Pt was unable to take her medications today and yesterday. Skin warm and dry and resp e/u. Pt A&O at baseline.

## 2013-08-13 NOTE — Progress Notes (Signed)
Pt briefly assessed during shift change. OR came to take pt to short stay. Pt transferred safely. VS WNL pt alert and comfortable. Family notified and is currently waiting in the waiting room. Will update family as I am updated from OR.

## 2013-08-13 NOTE — Consult Note (Signed)
I have seen and examined the pt and agree with NP-Riebock's progress note. Plt trx to help reverse aggrenox. To OR for [facility], possible bowel resection, possibel mesh placement. All risks and benefits were discussed with the patient, to generally include infection, bleeding, damage to surrounding structures, acute and chronic nerve pain, and recurrence. Alternatives were offered and described.  All questions were answered and the patient voiced understanding of the procedure and wishes to proceed at this point.

## 2013-08-13 NOTE — Transfer of Care (Signed)
Immediate Anesthesia Transfer of Care Note  Patient: [name]  Procedure(s) Performed: Procedure(s): HERNIA REPAIR INGUINAL INCARCERATED (Right)  Patient Location: PACU  Anesthesia Type:General  Level of Consciousness: sedated  Airway & Oxygen Therapy: Patient Spontanous Breathing and Patient connected to face mask oxygen  Post-op Assessment: Report given to PACU RN and Post -op Vital signs reviewed and stable  Post vital signs: Reviewed and stable  Complications: No apparent anesthesia complications

## 2013-08-14 DIAGNOSIS — IMO0001 Reserved for inherently not codable concepts without codable children: Principal | ICD-10-CM

## 2013-08-14 DIAGNOSIS — E785 Hyperlipidemia, unspecified: Secondary | ICD-10-CM

## 2013-08-14 DIAGNOSIS — I442 Atrioventricular block, complete: Secondary | ICD-10-CM

## 2013-08-14 DIAGNOSIS — I10 Essential (primary) hypertension: Secondary | ICD-10-CM

## 2013-08-14 DIAGNOSIS — Z950 Presence of cardiac pacemaker: Secondary | ICD-10-CM

## 2013-08-14 DIAGNOSIS — E039 Hypothyroidism, unspecified: Secondary | ICD-10-CM

## 2013-08-14 DIAGNOSIS — K4130 Unilateral femoral hernia, with obstruction, without gangrene, not specified as recurrent: Secondary | ICD-10-CM

## 2013-08-14 LAB — PREPARE PLATELET PHERESIS
Unit division: 0
Unit division: 0

## 2013-08-14 LAB — CBC
HCT: 35.3 % — ABNORMAL LOW (ref 36.0–46.0)
Hemoglobin: 12.1 g/dL (ref 12.0–15.0)
MCHC: 34.3 g/dL (ref 30.0–36.0)
Platelets: 283 10*3/uL (ref 150–400)
RBC: 3.79 MIL/uL — ABNORMAL LOW (ref 3.87–5.11)
WBC: 7 10*3/uL (ref 4.0–10.5)

## 2013-08-14 LAB — BASIC METABOLIC PANEL
CO2: 28 mEq/L (ref 19–32)
Chloride: 98 mEq/L (ref 96–112)
GFR calc non Af Amer: 53 mL/min — ABNORMAL LOW (ref >90)
Glucose, Bld: 126 mg/dL — ABNORMAL HIGH (ref 70–99)
Potassium: 3.3 mEq/L — ABNORMAL LOW (ref 3.5–5.1)
Sodium: 135 mEq/L (ref 135–145)

## 2013-08-14 MED ORDER — SODIUM CHLORIDE 0.9 % IJ SOLN: 9.0000 mL | INTRAMUSCULAR | Status: DC | PRN

## 2013-08-14 MED ORDER — ENOXAPARIN SODIUM 40 MG/0.4ML ~~LOC~~ SOLN
40.0000 mg | SUBCUTANEOUS | Status: DC
  Administered 2013-08-14 – 2013-08-16 (×3): 40 mg via SUBCUTANEOUS
  Filled 2013-08-14 (×4): qty 0.4

## 2013-08-14 MED ORDER — NALOXONE HCL 0.4 MG/ML IJ SOLN
0.4000 mg | INTRAMUSCULAR | Status: DC | PRN
  Administered 2013-08-15: 0.4 mg via INTRAVENOUS
  Filled 2013-08-14: qty 1

## 2013-08-14 MED ORDER — MORPHINE SULFATE (PF) 1 MG/ML IV SOLN
INTRAVENOUS | Status: DC
  Administered 2013-08-15: 01:00:00 via INTRAVENOUS
  Filled 2013-08-14: qty 25

## 2013-08-14 MED ORDER — PIPERACILLIN-TAZOBACTAM 3.375 G IVPB
3.3750 g | Freq: Three times a day (TID) | INTRAVENOUS | Status: DC
  Administered 2013-08-14 (×2): 3.375 g via INTRAVENOUS
  Filled 2013-08-14 (×5): qty 50

## 2013-08-14 MED ORDER — POTASSIUM CHLORIDE 10 MEQ/100ML IV SOLN
10.0000 meq | INTRAVENOUS | Status: AC
  Administered 2013-08-14 (×4): 10 meq via INTRAVENOUS
  Filled 2013-08-14 (×4): qty 100

## 2013-08-14 MED ORDER — DIPHENHYDRAMINE HCL 50 MG/ML IJ SOLN: 12.5000 mg | Freq: Four times a day (QID) | INTRAMUSCULAR | Status: DC | PRN

## 2013-08-14 MED ORDER — DIPHENHYDRAMINE HCL 12.5 MG/5ML PO ELIX
12.5000 mg | ORAL_SOLUTION | Freq: Four times a day (QID) | ORAL | Status: DC | PRN
  Filled 2013-08-14: qty 5

## 2013-08-14 MED ORDER — PANTOPRAZOLE SODIUM 40 MG IV SOLR
40.0000 mg | Freq: Every day | INTRAVENOUS | Status: DC
  Administered 2013-08-14 – 2013-08-19 (×7): 40 mg via INTRAVENOUS
  Filled 2013-08-14 (×9): qty 40

## 2013-08-14 MED ORDER — CHLORHEXIDINE GLUCONATE CLOTH 2 % EX PADS
6.0000 | MEDICATED_PAD | Freq: Every day | CUTANEOUS | Status: AC
  Administered 2013-08-14 – 2013-08-18 (×5): 6 via TOPICAL

## 2013-08-14 MED ORDER — MUPIROCIN 2 % EX OINT
1.0000 "application " | TOPICAL_OINTMENT | Freq: Two times a day (BID) | CUTANEOUS | Status: AC
  Administered 2013-08-14 – 2013-08-18 (×10): 1 via NASAL
  Filled 2013-08-14: qty 22

## 2013-08-14 MED ORDER — ONDANSETRON HCL 4 MG/2ML IJ SOLN: 4.0000 mg | Freq: Four times a day (QID) | INTRAMUSCULAR | Status: DC | PRN

## 2013-08-14 MED ORDER — POTASSIUM CHLORIDE 10 MEQ/100ML IV SOLN: 10.0000 meq | INTRAVENOUS | Status: DC

## 2013-08-14 NOTE — Progress Notes (Signed)
UR Completed.  [name] 08/14/2013 [phone]

## 2013-08-14 NOTE — Progress Notes (Signed)
Agree with above 

## 2013-08-14 NOTE — Anesthesia Postprocedure Evaluation (Signed)
Anesthesia Post Note  Patient: [name]  Procedure(s) Performed: Procedure(s) (LRB): HERNIA REPAIR INGUINAL INCARCERATED (Right)  Anesthesia type: General  Patient location: PACU  Post pain: Pain level controlled  Post assessment: Patient's Cardiovascular Status Stable  Last Vitals:  Filed Vitals:   08/14/13 0000  BP: 113/47  Pulse: 59  Temp:   Resp: 25    Post vital signs: Reviewed and stable  Level of consciousness: alert  Complications: No apparent anesthesia complications

## 2013-08-14 NOTE — Progress Notes (Signed)
Pt experiencing severe sleep apnea and desating into the 50's while quickly recovering when woke back up. Narcan was administered. MD was made aware and requested that her PCA be changed to low dose morphine. I made him aware of the Codeine allergy and he said he was not concerned about it that low dose morphine would be fine.   Will continue to monitor closely.

## 2013-08-14 NOTE — Care Management Note (Signed)
    Page 1 of 2   08/20/2013     3:35:52 PM   CARE MANAGEMENT NOTE 08/20/2013  Patient:  [name], [name]   Account Number:  [id number]  Date Initiated:  08/14/2013  Documentation initiated by:  [facility]  Subjective/Objective Assessment:   Post op repair of strangulated hernia     Action/Plan:   Anticipated DC Date:  08/21/2013   Anticipated DC Plan:  LONG TERM ACUTE CARE (LTAC)      DC Planning Services  CM consult      Choice offered to / List presented to:             Status of service:  In process, will continue to follow Medicare Important Message given?   (If response is "NO", the following Medicare IM given date fields will be blank) Date Medicare IM given:   Date Additional Medicare IM given:    Discharge Disposition:    Per UR Regulation:  Reviewed for med. necessity/level of care/duration of stay  If discussed at Long Length of Stay Meetings, dates discussed:    Comments:  ContactMarka, Treloar Son [phone]   [phone]                 [facility] Relative [phone]  08-20-13 11:25am Avie Arenas, RNBSN [phone] Updated patient and daughter in law in room.  Consult placed for [facility]'s.  Referrals made to both [facility] and [facility].   No bed has been offered yet.  pateint now off TNA and IV fluid.  Advanced to regular diet.  Once beds are offered will notify family of choice and then update physician.  If cannot get into [facility] - would like rehab at SNF - Do not want to go back to [facility].  SW updated.  patient too well for [facility] tx today.  No bed offers. Updated patient and son.  SW updated that plan now is for SNF for ST rehab  08-19-13 3pm Avie Arenas, RNBSN [phone] Patient sitting up in chair - eating clear liquids. Patient is HOH.  States she is from [place] but has been at two rehab facilities in past.  Could not remember name. States that I need to talk to son and daughter in law about discharge plans  - she does what ever they think is best. Called son - states patient has been at [facility] for rehab - not sure he wants her to go back there as she wasn't getting good medical care - rehab was great. From SW over weekend it appears family wanted to go to [name] - approached subject with son.  He has not talked with anyone about this before but is very interested in it. Will check insurance to see if able - informed son that it would be up to physician.  Insurance benefits at good for [facility] - at this time appears appropriate for level, called NP, who is in surgery currently to call me back to discuss discharge options.   CM will continue to follow.  08-15-13 4pm Avie Arenas, RNBSN [phone] less responsive - tachy rhythm.  Intubated for airway protection.  CT of head with no new changes noted.

## 2013-08-14 NOTE — Progress Notes (Signed)
Patient ID: [name], female   DOB: Aug 29, 1922, 79 y.o.   MRN: [id number] 1 Day Post-Op  Subjective: Pt c/o some pain today, but otherwise feels ok.  No further emesis.  No nausea.  No flatus.  Objective: Vital signs in last 24 hours: Temp:  [96.6 F (35.9 C)-99.3 F (37.4 C)] 99.3 F (37.4 C) (11/19 0714) Pulse Rate:  [41-93] 59 (11/19 0700) Resp:  [14-35] 30 (11/19 0700) BP: (113-160)/(44-85) 116/51 mmHg (11/19 0700) SpO2:  [92 %-100 %] 96 % (11/19 0700)    Intake/Output from previous day: 11/18 0701 - 11/19 0700 In: 1672.7 [I.V.:1055.7; Blood:317; NG/GT:50; IV Piggyback:250] Out: 1760 [Urine:915; Emesis/NG output:550; Drains:95] Intake/Output this shift:    PE: Abd: soft, appropriately tender, absent BS, NGT with bilious output, incision c/d/i with JP drain in place with some thin bloody output. Heart: paced, but regular rate and rhythm Lungs: CTAB  Lab Results:   Recent Labs  08/13/13 1155 08/14/13 0420  WBC 19.7* 7.0  HGB 14.8 12.1  HCT 41.1 35.3*  PLT 309 283   BMET  Recent Labs  08/13/13 1155 08/14/13 0420  NA 132* 135  K 3.2* 3.3*  CL 88* 98  CO2 30 28  GLUCOSE 134* 126*  BUN 41* 32*  CREATININE 1.29* 0.94  CALCIUM 9.0 7.5*   PT/INR No results found for this basename: LABPROT, INR,  in the last 72 hours CMP     Component Value Date/Time   NA 135 08/14/2013 0420   K 3.3* 08/14/2013 0420   CL 98 08/14/2013 0420   CO2 28 08/14/2013 0420   GLUCOSE 126* 08/14/2013 0420   BUN 32* 08/14/2013 0420   CREATININE 0.94 08/14/2013 0420   CALCIUM 7.5* 08/14/2013 0420   PROT 6.6 08/13/2013 1155   ALBUMIN 3.4* 08/13/2013 1155   AST 52* 08/13/2013 1155   ALT 32 08/13/2013 1155   ALKPHOS 88 08/13/2013 1155   BILITOT 0.7 08/13/2013 1155   GFRNONAA 53* 08/14/2013 0420   GFRAA 61* 08/14/2013 0420   Lipase     Component Value Date/Time   LIPASE 16 08/13/2013 1155       Studies/Results: Ct Abdomen Pelvis W Contrast  08/13/2013    CLINICAL DATA:  Nausea, vomiting  EXAM: CT ABDOMEN AND PELVIS WITH CONTRAST  TECHNIQUE: Multidetector CT imaging of the abdomen and pelvis was performed using the standard protocol following bolus administration of intravenous contrast.  CONTRAST:  80mL OMNIPAQUE IOHEXOL 300 MG/ML  SOLN  COMPARISON:  None.  FINDINGS: The lung bases are clear.  The liver demonstrates no focal abnormality. There is no intrahepatic or extrahepatic biliary ductal dilatation. The gallbladder is surgically absent. The spleen demonstrates no focal abnormality. The adrenal glands and pancreas are normal. There are multiple small hypodensities measuring less than 1 cm bilaterally, left greater than right, which are too small to characterize. The bladder is unremarkable.  There are multiple distended loops of small bowel with multiple air air-fluid levels. The stomach is distended and fluid-filled. There is a right inguinal hernia containing a dilated afferent loop and a decompressed efferent loop consistent with the site of transition. There is no pneumoperitoneum, pneumatosis, or portal venous gas. There is a small amount of pelvic free fluid. There is no lymphadenopathy.  The abdominal aorta is normal in caliber with atherosclerosis.  There are no lytic or sclerotic osseous lesions. There is a left hip arthroplasty.  IMPRESSION: High-grade small bowel obstruction resulting from a right inguinal hernia.   Electronically Signed  By: Elige Ko   On: 08/13/2013 15:03   Dg Chest Portable 1 View  08/13/2013   CLINICAL DATA:  Nausea, vomiting  EXAM: PORTABLE CHEST - 1 VIEW  COMPARISON:  07/01/2013  FINDINGS: Dual lead left-sided pacer in place. Nasogastric tube is appropriately positioned. Heart size is normal. No focal pulmonary opacity.  IMPRESSION: No acute cardiopulmonary process. Nasogastric tube appropriately positioned.   Electronically Signed   By: Christiana Pellant M.D.   On: 08/13/2013 18:29    Anti-infectives: Anti-infectives    Start     Dose/Rate Route Frequency Ordered Stop   08/14/13 0031  piperacillin-tazobactam (ZOSYN) IVPB 3.375 g     3.375 g 12.5 mL/hr over 240 Minutes Intravenous 3 times per day 08/14/13 0031     08/13/13 1715  piperacillin-tazobactam (ZOSYN) IVPB 3.375 g     3.375 g 12.5 mL/hr over 240 Minutes Intravenous 3 times per day 08/13/13 1700         Assessment/Plan  1. POD1, s/p SBR and primary repair of right femoral hernia with placement of JP drain 2. Recent left hip hemiarthroplasty for femur fx 3. Multiple CVAs, on aggrenox 4. Dementia 5. CHF 6. HTN 7. CAD 8. Post op ileus  Plan: 1. Will ask medicine to see the patient for management of her multiple medical issues 2. Cont NPO and NGT for post op ileus. 3. PT eval.  She just had a left hemiarthroplasty 5 weeks ago.  Will mobilize from surgery and continue therapy for her hip. 4. Recheck labs in am 5. Continue ICU  Monitoring given her age, surgery, and multiple medical problems 6. Continue to hold aggrenox for now.  Monitor blood counts and JP drain.  When stable we can resume aggrenox. 7. Cont foley for now.  UOP good, h/o incontinence.  8. Keep fluids for now.  Will have to watch and make sure she doesn't get fluid overloaded given her h/o CHF. 9. Maintain in ICU today  LOS: 1 day    Anne-Marie Genson E 08/14/2013, 8:24 AM Pager: [phone]

## 2013-08-14 NOTE — Evaluation (Signed)
Physical Therapy Evaluation Patient Details Name: [name] MRN: [id number] DOB: 09/08/1922 Today's Date: 08/14/2013 Time: 4098-1191 PT Time Calculation (min): 36 min  PT Assessment / Plan / Recommendation History of Present Illness  Patient admitted with N&V underwent small bowel resection and right femoral hernia repair.  s/p left hip hemiarthroplasty 5 weeks ago due to fall with femoral fracture.  She was at [facility] for rehab and prior to fall lived at [facility].  Clinical Impression  Patient presents with decreased independence with mobility due to deficits listed below.  She will benefit from skilled PT in the acute setting to maximize independence and allow return home after extended SNF stay.    PT Assessment  Patient needs continued PT services    Follow Up Recommendations  SNF    Does the patient have the potential to tolerate intense rehabilitation    N/A  Barriers to Discharge  None      Equipment Recommendations  None recommended by PT    Recommendations for Other Services   None  Frequency Min 3X/week    Precautions / Restrictions Precautions Precautions: Fall Precaution Comments: NG tube   Pertinent Vitals/Pain Min c/o mouth soreness      Mobility  Bed Mobility Bed Mobility: Rolling Left;Left Sidelying to Sit;Sit to Supine;Sitting - Scoot to Edge of Bed Rolling Left: 1: +2 Total assist Rolling Left: Patient Percentage: 60% Left Sidelying to Sit: HOB elevated;With rails;1: +2 Total assist Left Sidelying to Sit: Patient Percentage: 70% Sitting - Scoot to Edge of Bed: 3: Mod assist Sit to Supine: 1: +2 Total assist Sit to Supine: Patient Percentage: 30% Details for Bed Mobility Assistance: Assisted mainly with upper body for side to sit, cues for technique Transfers Transfers: Squat Pivot Transfers Squat Pivot Transfers: 3: Mod assist;2: Max assist Details for Transfer Assistance: attempted sit to stand to side step to head of bed,  but pt did squat pivot x 2 up to head of bed with assist Ambulation/Gait Ambulation/Gait Assistance: Not tested (comment)    Exercises Total Joint Exercises Ankle Circles/Pumps: AROM;Both;10 reps;Supine Short Arc Quad: AROM;Left;10 reps;Supine Heel Slides: AROM;Both;AAROM;Supine Hip ABduction/ADduction: AROM;Both;10 reps;Supine   PT Diagnosis: Generalized weakness;Difficulty walking  PT Problem List: Decreased strength;Decreased activity tolerance;Decreased mobility;Decreased balance;Pain PT Treatment Interventions: DME instruction;Balance training;Gait training;Functional mobility training;Patient/family education;Therapeutic activities;Therapeutic exercise     PT Goals(Current goals can be found in the care plan section) Acute Rehab PT Goals Patient Stated Goal: To return to independent PT Goal Formulation: With patient/family Time For Goal Achievement: 08/28/13 Potential to Achieve Goals: Good  Visit Information  Last PT Received On: 08/14/13 Assistance Needed: +2 History of Present Illness: Patient admitted with N&V underwent small bowel resection and right femoral hernia repair.  s/p left hip hemiarthroplasty 5 weeks ago due to fall with femoral fracture.  She was at [facility] for rehab and prior to fall lived at [facility].       Prior Functioning  Home Living Family/patient expects to be discharged to:: Skilled nursing facility Additional Comments: daughter in law reports plans for d/c back home next tuesday prior to this event Prior Function Comments: legally blind, cannot read. Communication Communication: HOH    Cognition  Cognition Arousal/Alertness: Awake/alert Behavior During Therapy: WFL for tasks assessed/performed Overall Cognitive Status: Impaired/Different from baseline Area of Impairment: Orientation Orientation Level: Situation    Extremity/Trunk Assessment Upper Extremity Assessment Upper Extremity Assessment: Defer to OT evaluation Lower  Extremity Assessment Lower Extremity Assessment: RLE deficits/detail;LLE deficits/detail RLE  Deficits / Details: AROM WFL, strength grossly 4-/5 LLE Deficits / Details: AROM WFL, strength grossly 4-/5   Balance Balance Balance Assessed: Yes Static Sitting Balance Static Sitting - Balance Support: Feet unsupported;Bilateral upper extremity supported Static Sitting - Level of Assistance: 5: Stand by assistance  End of Session PT - End of Session Equipment Utilized During Treatment: Oxygen Activity Tolerance: Patient limited by fatigue Patient left: in bed;with call bell/phone within reach;with family/visitor present  GP     [facility] 08/14/2013, 11:56 AM Sheran Lawless, PT [phone] 08/14/2013

## 2013-08-14 NOTE — Consult Note (Addendum)
Medical Consultation   [name]  ZOX:[id number]  DOB: 1921/12/29  DOA: 08/13/2013  PCP: Oliver Barre, MD  Requesting physician: Ms. Barnetta Chapel, PA (Surgery)  Reason for consultation: Medical management   History of Present Illness: An 79 year old female history of CVA, and the degree heart block status post pacemaker placement, mild dementia, hypertension, hypothyroidism, coronary disease that presents to the emergency department with nausea vomiting abdominal pain. Patient had these symptoms for 3 days before coming in. Patient states it was sudden onset. No trauma is at bedside. She did state that patient was appearing to be ill. Patient had not had any oral intake for approximately 3 days. Patient was found to have high-grade small bowel obstruction secondary to right inguinal hernia which sound to be strangulated with ischemic bowel. Surgical repair with small bowel resection and anastomosis was conducted. Patient is currently in the ICU. She continues to have NG tube to suction. At this time patient denies any chest pain, shortness of breath, dizziness, cough. She does wish that she can drink water. We were consulted for medical management.  Allergies:   Allergies  Allergen Reactions  . Codeine     unknown      Past Medical History  Diagnosis Date  . Aphasia 01/09/2009  . 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 CEREBROVASCULAR ACCIDENT, HX OF 11/20/2007  . CVA 01/09/2009  . DEMENTIA 05/19/2008  . DIVERTICULOSIS, COLON 11/20/2007  . FATIGUE 11/20/2007  . GLUCOSE INTOLERANCE 12/06/2010  . HYPERLIPIDEMIA 11/20/2007  . HYPERTENSION 11/20/2007    Echo was done 07/07/11: mod LVH, EF 55-60%, grade 1 diast dysfxn, mild MR, mild LAE, mod TR, PASP 39  . HYPOTHYROIDISM 11/20/2007  . IRON DEFICIENCY 12/06/2010  . PACEMAKER, PERMANENT 01/09/2009  . TIA 05/14/2003    elevated CE's in setting of TIA and falls in 10/12 - echo normal with no further cardiac w/u pursued  . Urinary incontinence  01/09/2009  . Impaired glucose tolerance 06/03/2011  . TIA (transient ischemic attack) 10/10/2011  . Dementia   . CHF (congestive heart failure)   . Coronary artery disease   . Arthritis     right hand  . Pacemaker 2002  . Femoral fracture 07/01/2013    Past Surgical History  Procedure Laterality Date  . Medtronic cynthia dual chamber pacemaker serial number was [id number]...04/15/2009    . Abdominal hysterectomy    . S/p cerebral aneurysm  1991  . S/p pacemaker ddd    . Cholecystectomy    . S/p retinal attachment    . S/p thyroidectomy    . Cystocele repair N/A 11/26/2012    Procedure: Cystocele Repair with Lefort Colpocleisis.  colpectomy Levatorplasty Perineorraphy;  Surgeon: Lenoard Aden, MD;  Location: [facility];  Service: Gynecology;  Laterality: N/A;  Cystocele Repair with Lefort Colpocleisis.  . Hip arthroplasty Left 07/02/2013    Procedure: ARTHROPLASTY BIPOLAR HIP;  Surgeon: Shelda Pal, MD;  Location: [facility];  Service: Orthopedics;  Laterality: Left;    Social History:  reports that she has never smoked. She has never used smokeless tobacco. She reports that she does not drink alcohol or use illicit drugs.  Family History  Problem Relation Age of Onset  . Cancer Mother     breast cancer    Review of Systems:  Constitutional: Denies fever, chills, diaphoresis, appetite change and fatigue.  HEENT: Denies photophobia, eye pain, redness, hearing loss, ear pain, congestion, sore throat, rhinorrhea, sneezing, mouth sores, trouble swallowing, neck pain, neck stiffness and tinnitus.  Respiratory: Denies SOB, DOE, cough, chest tightness,  and wheezing.   Cardiovascular: Denies chest pain, palpitations and leg swelling.  Gastrointestinal: Denies nausea, vomiting, diarrhea, constipation, blood in stool and abdominal distention.  Patient continues to have abdominal pain. Genitourinary: Denies dysuria, urgency, frequency, hematuria, flank pain and difficulty urinating.   Musculoskeletal: Denies myalgias, back pain, joint swelling, arthralgias and gait problem.  Skin: Denies pallor, rash and wound.  Neurological: Denies dizziness, seizures, syncope, weakness, light-headedness, numbness and headaches.  Hematological: Denies adenopathy. Easy bruising, personal or family bleeding history  Psychiatric/Behavioral: Denies suicidal ideation, mood changes, confusion, nervousness, sleep disturbance and agitation  Physical Exam: Blood pressure 109/54, pulse 85, temperature 98.7 F (37.1 C), temperature source Oral, resp. rate 21, height 5\' 4"  (1.626 m), SpO2 96.00%.   General: Well developed, well nourished, NAD, appears stated age  HEENT: NCAT, PERRLA, EOMI, Anicteic Sclera, NG tube in place  Neck: Supple, no JVD, no masses,  Cardiovascular: S1 S2 auscultated, no rubs, murmurs or gallops. Regular rate and rhythm.  Respiratory: Clear to auscultation bilaterally with equal chest rise  Abdomen: Soft, diffuse tenderness to palpation, nondistended, + bowel sounds, bandage noted on abdomen and found to be clean. JP drain in place with bloody discharge.  Extremities: warm dry without cyanosis clubbing or edema  Neuro: AAOx3, cranial nerves grossly intact. Strength 5/5 in patient's upper and lower extremities bilaterally  Skin: Without rashes exudates or nodules  Psych: Normal affect and demeanor.  Labs on Admission:  Basic Metabolic Panel:  Recent Labs Lab 08/13/13 1155 08/14/13 0420  NA 132* 135  K 3.2* 3.3*  CL 88* 98  CO2 30 28  GLUCOSE 134* 126*  BUN 41* 32*  CREATININE 1.29* 0.94  CALCIUM 9.0 7.5*   Liver Function Tests:  Recent Labs Lab 08/13/13 1155  AST 52*  ALT 32  ALKPHOS 88  BILITOT 0.7  PROT 6.6  ALBUMIN 3.4*    Recent Labs Lab 08/13/13 1155  LIPASE 16   No results found for this basename: AMMONIA,  in the last 168 hours CBC:  Recent Labs Lab 08/13/13 1155 08/14/13 0420  WBC 19.7* 7.0  NEUTROABS 16.5*  --   HGB  14.8 12.1  HCT 41.1 35.3*  MCV 91.1 93.1  PLT 309 283   Cardiac Enzymes: No results found for this basename: CKTOTAL, CKMB, CKMBINDEX, TROPONINI,  in the last 168 hours BNP: No components found with this basename: POCBNP,  CBG: No results found for this basename: GLUCAP,  in the last 168 hours  Inpatient Medications:   Scheduled Meds: . Chlorhexidine Gluconate Cloth  6 each Topical Daily  . enoxaparin (LOVENOX) injection  40 mg Subcutaneous Q24H  . HYDROmorphone PCA 0.3 mg/mL   Intravenous Q4H  . levothyroxine  68.5 mcg Intravenous Daily  . mupirocin ointment  1 application Nasal BID  . ondansetron  4 mg Intravenous Q6H  . pantoprazole (PROTONIX) IV  40 mg Intravenous QHS  . piperacillin-tazobactam (ZOSYN)  IV  3.375 g Intravenous Q8H  . piperacillin-tazobactam (ZOSYN)  IV  3.375 g Intravenous Q8H   Continuous Infusions: . dextrose 5 % and 0.9% NaCl 100 mL/hr (08/14/13 0127)    Radiological Exams on Admission: Ct Abdomen Pelvis W Contrast  08/13/2013   CLINICAL DATA:  Nausea, vomiting  EXAM: CT ABDOMEN AND PELVIS WITH CONTRAST  TECHNIQUE: Multidetector CT imaging of the abdomen and pelvis was performed using the standard protocol following bolus administration of intravenous contrast.  CONTRAST:  80mL OMNIPAQUE IOHEXOL 300 MG/ML  SOLN  COMPARISON:  None.  FINDINGS: The lung bases are clear.  The liver demonstrates no focal abnormality. There is no intrahepatic or extrahepatic biliary ductal dilatation. The gallbladder is surgically absent. The spleen demonstrates no focal abnormality. The adrenal glands and pancreas are normal. There are multiple small hypodensities measuring less than 1 cm bilaterally, left greater than right, which are too small to characterize. The bladder is unremarkable.  There are multiple distended loops of small bowel with multiple air air-fluid levels. The stomach is distended and fluid-filled. There is a right inguinal hernia containing a dilated afferent  loop and a decompressed efferent loop consistent with the site of transition. There is no pneumoperitoneum, pneumatosis, or portal venous gas. There is a small amount of pelvic free fluid. There is no lymphadenopathy.  The abdominal aorta is normal in caliber with atherosclerosis.  There are no lytic or sclerotic osseous lesions. There is a left hip arthroplasty.  IMPRESSION: High-grade small bowel obstruction resulting from a right inguinal hernia.   Electronically Signed   By: Elige Ko   On: 08/13/2013 15:03   Dg Chest Portable 1 View  08/13/2013   CLINICAL DATA:  Nausea, vomiting  EXAM: PORTABLE CHEST - 1 VIEW  COMPARISON:  07/01/2013  FINDINGS: Dual lead left-sided pacer in place. Nasogastric tube is appropriately positioned. Heart size is normal. No focal pulmonary opacity.  IMPRESSION: No acute cardiopulmonary process. Nasogastric tube appropriately positioned.   Electronically Signed   By: Christiana Pellant M.D.   On: 08/13/2013 18:29    Impression/Recommendations Principal Problem:   Incarcerated femoral hernia Active Problems:   HYPOTHYROIDISM   HYPERLIPIDEMIA   DEMENTIA   HYPERTENSION   AV BLOCK, COMPLETE   PACEMAKER, PERMANENT  Right strangulated femoral hernia with ischemic bowel Patient is postop day 1. Currently being followed and managed by surgery. NG tube still in place. Continue pain control with hydromorphone PCA.  Leukocytosis Resolved likely secondary to straining elated femoral hernia, acute phase reactant.  Hypokalemia Will replace with potassium chloride and continue to monitor BMP  Hypocalcemia Corrected calcium 7.98. Possibly delusional component as patient did have normal calcium of 9 and 08/13/2013.  Will continue to monitor  Acute kidney injury Likely secondary to dehydration. Resolved, creatinine 0.94  Hypothyroidism Continue her thyroxine IV 16.5 mcg daily  Hypertension Currently controlled. Continue metoprolol 5 mg IV every 6 hours as  needed.  Dementia Currently on no medications due to n.p.o. Status.  History of third-degree AV block status post pacemaker placement Currently stable currently stable  Hyperlipidemia  Currently on no medications due to n.p.o. status   DVT prophylaxis Lovenox  I will followup again in the morning. Please contact me if I can be of assistance in the meanwhile. Thank you for this consultation.  Time Spent: 40 minutes  Kamaryn Grimley D.O. [facility] 08/14/2013, 12:35 PM

## 2013-08-14 NOTE — Clinical Documentation Improvement (Signed)
THIS DOCUMENT IS NOT A PERMANENT PART OF THE MEDICAL RECORD  Please update your documentation with the medical record to reflect your response to this query. If you need help knowing how to do this please call [phone].  08/14/13  Dear [facility],  In a better effort to capture your patient's severity of illness, reflect appropriate length of stay and utilization of resources, a review of the patient medical record has revealed the following indicators the diagnosis of Heart Failure.    Based on your clinical judgment, please clarify and document in a progress note and/or discharge summary the clinical condition associated with the following supporting information:  In responding to this query please exercise your independent judgment.  The fact that a query is asked, does not imply that any particular answer is desired or expected.   Possible Clinical Conditions?  Chronic Systolic Congestive Heart Failure Chronic Diastolic Congestive Heart Failure Chronic Systolic & Diastolic Congestive Heart Failure Acute Systolic Congestive Heart Failure Acute Diastolic Congestive Heart Failure Acute Systolic & Diastolic Congestive Heart Failure Acute on Chronic Systolic Congestive Heart Failure Acute on Chronic Diastolic Congestive Heart Failure Acute on Chronic Systolic & Diastolic  Congestive Heart Failure Other Condition Cannot Clinically Determine   Risk Factors: Keep fluids for now, watch and make sure she doesn't get overloaded given her history of CHF, noted per 11/19 progress notes.   Reviewed:  no additional documentation provided  Thank You,  Marciano Sequin, Clinical Documentation Specialist: [phone] Health Information Management [place]

## 2013-08-15 ENCOUNTER — Inpatient Hospital Stay (HOSPITAL_COMMUNITY): Payer: Medicare Other

## 2013-08-15 DIAGNOSIS — E876 Hypokalemia: Secondary | ICD-10-CM

## 2013-08-15 DIAGNOSIS — R4182 Altered mental status, unspecified: Secondary | ICD-10-CM

## 2013-08-15 DIAGNOSIS — J690 Pneumonitis due to inhalation of food and vomit: Secondary | ICD-10-CM

## 2013-08-15 DIAGNOSIS — J9600 Acute respiratory failure, unspecified whether with hypoxia or hypercapnia: Secondary | ICD-10-CM

## 2013-08-15 DIAGNOSIS — K56609 Unspecified intestinal obstruction, unspecified as to partial versus complete obstruction: Secondary | ICD-10-CM

## 2013-08-15 DIAGNOSIS — A419 Sepsis, unspecified organism: Secondary | ICD-10-CM

## 2013-08-15 LAB — POCT I-STAT 3, ART BLOOD GAS (G3+)
Acid-Base Excess: 2 mmol/L (ref 0.0–2.0)
Bicarbonate: 25.8 mEq/L — ABNORMAL HIGH (ref 20.0–24.0)
O2 Saturation: 97 %
Patient temperature: 98.7
TCO2: 27 mmol/L (ref 0–100)
TCO2: 28 mmol/L (ref 0–100)
pCO2 arterial: 38.2 mmHg (ref 35.0–45.0)
pCO2 arterial: 39.6 mmHg (ref 35.0–45.0)
pH, Arterial: 7.438 (ref 7.350–7.450)
pO2, Arterial: 91 mmHg (ref 80.0–100.0)

## 2013-08-15 LAB — CBC
HCT: 32.2 % — ABNORMAL LOW (ref 36.0–46.0)
Hemoglobin: 10.7 g/dL — ABNORMAL LOW (ref 12.0–15.0)
MCH: 31.6 pg (ref 26.0–34.0)
MCHC: 33.2 g/dL (ref 30.0–36.0)
MCV: 95 fL (ref 78.0–100.0)
Platelets: 230 K/uL (ref 150–400)
RBC: 3.39 MIL/uL — ABNORMAL LOW (ref 3.87–5.11)
RDW: 13.4 % (ref 11.5–15.5)
WBC: 6.2 K/uL (ref 4.0–10.5)

## 2013-08-15 LAB — BASIC METABOLIC PANEL WITH GFR
BUN: 23 mg/dL (ref 6–23)
CO2: 28 meq/L (ref 19–32)
Calcium: 7.2 mg/dL — ABNORMAL LOW (ref 8.4–10.5)
Chloride: 105 meq/L (ref 96–112)
Creatinine, Ser: 0.83 mg/dL (ref 0.50–1.10)
GFR calc Af Amer: 71 mL/min — ABNORMAL LOW (ref >90)
GFR calc non Af Amer: 61 mL/min — ABNORMAL LOW (ref >90)
Glucose, Bld: 156 mg/dL — ABNORMAL HIGH (ref 70–99)
Potassium: 3.3 meq/L — ABNORMAL LOW (ref 3.5–5.1)
Sodium: 139 meq/L (ref 135–145)

## 2013-08-15 LAB — URINALYSIS, ROUTINE W REFLEX MICROSCOPIC
Leukocytes, UA: NEGATIVE
Nitrite: NEGATIVE
Specific Gravity, Urine: 1.01 (ref 1.005–1.030)
Urobilinogen, UA: 0.2 mg/dL (ref 0.0–1.0)
pH: 6.5 (ref 5.0–8.0)

## 2013-08-15 LAB — URINE MICROSCOPIC-ADD ON

## 2013-08-15 LAB — GLUCOSE, CAPILLARY: Glucose-Capillary: 149 mg/dL — ABNORMAL HIGH (ref 70–99)

## 2013-08-15 LAB — D-DIMER, QUANTITATIVE: D-Dimer, Quant: 2.32 ug/mL-FEU — ABNORMAL HIGH (ref 0.00–0.48)

## 2013-08-15 MED ORDER — ETOMIDATE 2 MG/ML IV SOLN
10.0000 mg | Freq: Once | INTRAVENOUS | Status: AC
  Administered 2013-08-15: 10 mg via INTRAVENOUS

## 2013-08-15 MED ORDER — MIDAZOLAM HCL 2 MG/2ML IJ SOLN
2.0000 mg | Freq: Once | INTRAMUSCULAR | Status: AC
  Administered 2013-08-15: 2 mg via INTRAVENOUS

## 2013-08-15 MED ORDER — CHLORHEXIDINE GLUCONATE 0.12 % MT SOLN
15.0000 mL | Freq: Two times a day (BID) | OROMUCOSAL | Status: DC
  Administered 2013-08-15 – 2013-08-19 (×8): 15 mL via OROMUCOSAL
  Filled 2013-08-15 (×8): qty 15

## 2013-08-15 MED ORDER — FENTANYL CITRATE 0.05 MG/ML IJ SOLN
150.0000 ug | Freq: Once | INTRAMUSCULAR | Status: AC
  Administered 2013-08-15: 150 ug via INTRAVENOUS

## 2013-08-15 MED ORDER — DEXTROSE 5 % IV SOLN
1.0000 g | INTRAVENOUS | Status: DC
  Administered 2013-08-15 – 2013-08-21 (×7): 1 g via INTRAVENOUS
  Filled 2013-08-15 (×7): qty 1

## 2013-08-15 MED ORDER — FAT EMULSION 20 % IV EMUL
200.0000 mL | INTRAVENOUS | Status: AC
  Administered 2013-08-15: 200 mL via INTRAVENOUS
  Filled 2013-08-15: qty 200

## 2013-08-15 MED ORDER — POTASSIUM CHLORIDE 10 MEQ/50ML IV SOLN
10.0000 meq | INTRAVENOUS | Status: AC
  Administered 2013-08-15 (×2): 10 meq via INTRAVENOUS
  Filled 2013-08-15 (×2): qty 50

## 2013-08-15 MED ORDER — DEXTROSE-NACL 5-0.9 % IV SOLN
INTRAVENOUS | Status: AC
  Administered 2013-08-15: 22:00:00 via INTRAVENOUS

## 2013-08-15 MED ORDER — FENTANYL CITRATE 0.05 MG/ML IJ SOLN
25.0000 ug | INTRAMUSCULAR | Status: DC | PRN
  Administered 2013-08-15: 100 ug via INTRAVENOUS
  Administered 2013-08-15: 50 ug via INTRAVENOUS
  Administered 2013-08-15: 100 ug via INTRAVENOUS
  Administered 2013-08-16 (×2): 25 ug via INTRAVENOUS
  Filled 2013-08-15 (×5): qty 2

## 2013-08-15 MED ORDER — CLINIMIX E/DEXTROSE (5/15) 5 % IV SOLN
INTRAVENOUS | Status: DC
  Filled 2013-08-15: qty 1000

## 2013-08-15 MED ORDER — TRACE MINERALS CR-CU-F-FE-I-MN-MO-SE-ZN IV SOLN
INTRAVENOUS | Status: AC
  Administered 2013-08-15: 18:00:00 via INTRAVENOUS
  Filled 2013-08-15: qty 1000

## 2013-08-15 MED ORDER — FENTANYL CITRATE 0.05 MG/ML IJ SOLN
INTRAMUSCULAR | Status: AC
  Administered 2013-08-15: 150 ug via INTRAVENOUS
  Filled 2013-08-15: qty 4

## 2013-08-15 MED ORDER — INSULIN ASPART 100 UNIT/ML ~~LOC~~ SOLN
0.0000 [IU] | Freq: Four times a day (QID) | SUBCUTANEOUS | Status: DC
  Administered 2013-08-15 – 2013-08-16 (×7): 1 [IU] via SUBCUTANEOUS
  Administered 2013-08-17 (×2): 2 [IU] via SUBCUTANEOUS

## 2013-08-15 MED ORDER — BIOTENE DRY MOUTH MT LIQD
15.0000 mL | Freq: Four times a day (QID) | OROMUCOSAL | Status: DC
  Administered 2013-08-15 – 2013-08-19 (×16): 15 mL via OROMUCOSAL

## 2013-08-15 MED ORDER — LORAZEPAM 2 MG/ML IJ SOLN: 0.5000 mg | Freq: Once | INTRAMUSCULAR | Status: DC

## 2013-08-15 MED ORDER — SODIUM CHLORIDE 0.9 % IJ SOLN
10.0000 mL | INTRAMUSCULAR | Status: DC | PRN
  Administered 2013-08-15: 10 mL

## 2013-08-15 MED ORDER — ROCURONIUM BROMIDE 50 MG/5ML IV SOLN
40.0000 mg | Freq: Once | INTRAVENOUS | Status: AC
  Administered 2013-08-15: 40 mg via INTRAVENOUS
  Filled 2013-08-15: qty 4

## 2013-08-15 MED ORDER — SODIUM CHLORIDE 0.9 % IJ SOLN
10.0000 mL | Freq: Two times a day (BID) | INTRAMUSCULAR | Status: DC
  Administered 2013-08-16 – 2013-08-23 (×6): 10 mL

## 2013-08-15 MED ORDER — MIDAZOLAM HCL 2 MG/2ML IJ SOLN
1.0000 mg | INTRAMUSCULAR | Status: DC | PRN
  Administered 2013-08-15 (×3): 2 mg via INTRAVENOUS
  Filled 2013-08-15 (×2): qty 2

## 2013-08-15 MED ORDER — VANCOMYCIN HCL IN DEXTROSE 1-5 GM/200ML-% IV SOLN
1000.0000 mg | INTRAVENOUS | Status: DC
Start: 2013-08-15 — End: 2013-08-17
  Administered 2013-08-15 – 2013-08-16 (×2): 1000 mg via INTRAVENOUS
  Filled 2013-08-15 (×3): qty 200

## 2013-08-15 MED ORDER — FAT EMULSION 20 % IV EMUL
200.0000 mL | INTRAVENOUS | Status: DC
  Filled 2013-08-15: qty 200

## 2013-08-15 MED ORDER — MIDAZOLAM HCL 2 MG/2ML IJ SOLN
INTRAMUSCULAR | Status: AC
  Administered 2013-08-15: 2 mg via INTRAVENOUS
  Filled 2013-08-15: qty 4

## 2013-08-15 NOTE — Progress Notes (Addendum)
ANTIBIOTIC CONSULT NOTE - INITIAL  Pharmacy Consult for vancomycin and cefepime Indication: pneumonia  Allergies  Allergen Reactions  . Codeine     unknown    Patient Measurements: Height: 5\' 4"  (162.6 cm) Weight: 143 lb 4.8 oz (65 kg) IBW/kg (Calculated) : 54.7   Vital Signs: Temp: 98.8 F (37.1 C) (11/20 0707) Temp src: Oral (11/20 0707) BP: 138/58 mmHg (11/20 0700) Pulse Rate: 68 (11/20 0700) Intake/Output from previous day: 11/19 0701 - 11/20 0700 In: 2882.3 [P.O.:50; I.V.:2407.3; IV Piggyback:425] Out: 1440 [Urine:870; Emesis/NG output:500; Drains:70] Intake/Output from this shift:    Labs:  Recent Labs  08/13/13 1155 08/14/13 0420 08/15/13 0438  WBC 19.7* 7.0 6.2  HGB 14.8 12.1 10.7*  PLT 309 283 230  CREATININE 1.29* 0.94 0.83   Estimated Creatinine Clearance: 40.5 ml/min (by C-G formula based on Cr of 0.83). No results found for this basename: VANCOTROUGH, Leodis Binet, VANCORANDOM, GENTTROUGH, GENTPEAK, GENTRANDOM, TOBRATROUGH, TOBRAPEAK, TOBRARND, AMIKACINPEAK, AMIKACINTROU, AMIKACIN,  in the last 72 hours   Microbiology: Recent Results (from the past 720 hour(s))  SURGICAL PCR SCREEN     Status: Abnormal   Collection Time    08/13/13  6:52 PM      Result Value Range Status   MRSA, PCR NEGATIVE  NEGATIVE Final   Staphylococcus aureus POSITIVE (*) NEGATIVE Final   Comment:            The Xpert SA Assay (FDA     approved for NASAL specimens     in patients over 74 years of age),     is one component of     a comprehensive surveillance     program.  Test performance has     been validated by [employer] for patients greater     than or equal to 77 year old.     It is not intended     to diagnose infection nor to     guide or monitor treatment.    Medical History: Past Medical History  Diagnosis Date  . Aphasia 01/09/2009  . 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 CEREBROVASCULAR ACCIDENT, HX OF 11/20/2007  . CVA 01/09/2009  . DEMENTIA 05/19/2008   . DIVERTICULOSIS, COLON 11/20/2007  . FATIGUE 11/20/2007  . GLUCOSE INTOLERANCE 12/06/2010  . HYPERLIPIDEMIA 11/20/2007  . HYPERTENSION 11/20/2007    Echo was done 07/07/11: mod LVH, EF 55-60%, grade 1 diast dysfxn, mild MR, mild LAE, mod TR, PASP 39  . HYPOTHYROIDISM 11/20/2007  . IRON DEFICIENCY 12/06/2010  . PACEMAKER, PERMANENT 01/09/2009  . TIA 05/14/2003    elevated CE's in setting of TIA and falls in 10/12 - echo normal with no further cardiac w/u pursued  . Urinary incontinence 01/09/2009  . Impaired glucose tolerance 06/03/2011  . TIA (transient ischemic attack) 10/10/2011  . Dementia   . CHF (congestive heart failure)   . Coronary artery disease   . Arthritis     right hand  . Pacemaker 2002  . Femoral fracture 07/01/2013    Assessment: 79 YOF s/p repair of hernia and ischemic bowel who has been having episodes of desaturation. To start vancomycin and cefepime for suspected PNA. Was on Zosyn per MD- now d/c'd. SCr 0.83 which has improved over the past couple of days. CrCl ~43mL/min. No cultures. WBC 6.2, afebrile currently. CXR from this morning shows infiltrates.  Goal of Therapy:  Vancomycin trough level 15-20 mcg/ml  Plan:  1. Cefepime 1g IV q24h 2. Vancomycin 1000mg  IV q24h  3. Follow c/s, renal function, clinical progression, trough at [facility]  Able Malloy D. Remer Couse, PharmD, BCPS Clinical Pharmacist Pager: [phone] 08/15/2013 8:52 AM

## 2013-08-15 NOTE — Progress Notes (Signed)
MD notified of increasing apnea and tireness Another dose of narcan was given per MD order. No new orders received at this time.  Pt comfortable and resting. Pt states she is tired and its getting harder to stay awake. When pt is awake sats are in the upper 90's on 2L [place], RR in the 20's etco2 in the lower 30's BP and HR are WNL. Will continue to monitor.

## 2013-08-15 NOTE — Progress Notes (Signed)
PARENTERAL NUTRITION CONSULT NOTE - INITIAL  Indication: Prolonged Ileus  Allergies  Allergen Reactions  . Codeine     unknown   Patient Measurements: Height: 5\' 4"  (162.6 cm) Weight: 143 lb 4.8 oz (65 kg) IBW/kg (Calculated) : 54.7  Vital Signs: Temp: 98.8 F (37.1 C) (11/20 0707) Temp src: Oral (11/20 0707) BP: 138/58 mmHg (11/20 0700) Pulse Rate: 68 (11/20 0700) Intake/Output from previous day: 11/19 0701 - 11/20 0700 In: 2882.3 [P.O.:50; I.V.:2407.3; IV Piggyback:425] Out: 1440 [Urine:870; Emesis/NG output:500; Drains:70]  Labs:  Recent Labs  08/13/13 1155 08/14/13 0420 08/15/13 0438  WBC 19.7* 7.0 6.2  HGB 14.8 12.1 10.7*  HCT 41.1 35.3* 32.2*  PLT 309 283 230    Recent Labs  08/13/13 1155 08/14/13 0420 08/15/13 0438  NA 132* 135 139  K 3.2* 3.3* 3.3*  CL 88* 98 105  CO2 30 28 28   GLUCOSE 134* 126* 156*  BUN 41* 32* 23  CREATININE 1.29* 0.94 0.83  CALCIUM 9.0 7.5* 7.2*  PROT 6.6  --   --   ALBUMIN 3.4*  --   --   AST 52*  --   --   ALT 32  --   --   ALKPHOS 88  --   --   BILITOT 0.7  --   --    Estimated Creatinine Clearance: 40.5 ml/min (by C-G formula based on Cr of 0.83).   Medical History: Past Medical History  Diagnosis Date  . Aphasia 01/09/2009  . 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 CEREBROVASCULAR ACCIDENT, HX OF 11/20/2007  . CVA 01/09/2009  . DEMENTIA 05/19/2008  . DIVERTICULOSIS, COLON 11/20/2007  . FATIGUE 11/20/2007  . GLUCOSE INTOLERANCE 12/06/2010  . HYPERLIPIDEMIA 11/20/2007  . HYPERTENSION 11/20/2007    Echo was done 07/07/11: mod LVH, EF 55-60%, grade 1 diast dysfxn, mild MR, mild LAE, mod TR, PASP 39  . HYPOTHYROIDISM 11/20/2007  . IRON DEFICIENCY 12/06/2010  . PACEMAKER, PERMANENT 01/09/2009  . TIA 05/14/2003    elevated CE's in setting of TIA and falls in 10/12 - echo normal with no further cardiac w/u pursued  . Urinary incontinence 01/09/2009  . Impaired glucose tolerance 06/03/2011  . TIA (transient ischemic attack) 10/10/2011   . Dementia   . CHF (congestive heart failure)   . Coronary artery disease   . Arthritis     right hand  . Pacemaker 2002  . Femoral fracture 07/01/2013   Insulin Requirements in the past 24 hours:  No history of diabetes but she does have a history of glucose intolerance.  She has not received any supplemental insulin in the last 24 hours.    Current Nutrition:  She has been NPO for the last several days with some nausea, vomiting and abdominal pain the week prior to admit.  PO intake had been poor.  She is slightly above desired IBW but not obese.  Assessment: 79 yo female with multiple medical problems who is s/p hernia repair with prolonged ileus.  She has a PMH that is significant for Diverticulosis, Hyperlipidemia, Hypothyroidism,  Iron deficiency, dementia, CVA as well as other co-morbidities.  Her labs this morning reveal some electrolyte abnormalities but a normal renal function.  Nutritional Goals:  ~ 1600 kCal, 80 grams of protein per day  Plan:   Initiate Clinimix 5% with electrolytes with MVI/Trace at rate of 73ml/hr and titrate to goal rate.  Start Lipids at 8 ml/hr  F/U labs and adjust as needed  F/U RD nutritional goals  Add SSI  Decrease IV to 51ml/hr when TNA starts  Nadara Mustard, PharmD., MS Clinical Pharmacist Pager:  [phone] Thank you for allowing pharmacy to be part of this patients care team. 08/15/2013,9:04 AM

## 2013-08-15 NOTE — Procedures (Signed)
Intubation Procedure Note [name] [id number] 1922/04/18  Procedure: Intubation Indications: Respiratory insufficiency  Procedure Details Consent: Risks of procedure as well as the alternatives and risks of each were explained to the (patient/caregiver).  Consent for procedure obtained. Time Out: Verified patient identification, verified procedure, site/side was marked, verified correct patient position, special equipment/implants available, medications/allergies/relevent history reviewed, required imaging and test results available.  Performed  Maximum sterile technique was used including antiseptics, gloves, hand hygiene and mask.  MAC    Evaluation Hemodynamic Status: BP stable throughout; O2 sats: stable throughout Patient's Current Condition: stable Complications: No apparent complications Patient did tolerate procedure well. Chest X-ray ordered to verify placement.  CXR: pending.   [name] 08/15/2013

## 2013-08-15 NOTE — Progress Notes (Signed)
Brief HPI: 79 yo WF who presented to ED on 08/13/13 with 3 days of n/v and abd pain. Found to have a stranfulated femoral hernia and ischemic bowel. She underwent repair of said hernia and Small bowel resection by general surgery. 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 [facility] was asked to consult to manage medical issues on 08/14/13.  Event: Tonight, RN paged this NP secondary to pt having apneic spells without desaturation. Pt was easily arousable although anxious (hx dementia) and no other complaints. Per chart, could find no hx of OSA. Asked RN to place venturi mask and monitor, call for any desaturation or change in resp status. Early am, RN paged again stating pt had desaturated to the 40s and venturi mask was increased to 50% and O2 sat returned to normal. RN says pt continues to seem to hold her breath at times and has been anxious all night. NP to bedside. S: As per RN above. Pt says she is SOB at times and is nervous. She is not a great historian given her hx dementia. O: Pt is a fairly well appearing, chronically ill appearing 79 yo WF in NAD. She is awake and answers direct questions and is able to follow commands. CARD: RRR. No JVD. RESP: normal effort. CTA with dimished breath sounds. I do not appreciate any crackles or wheezing. No apneic spells noted. O2 sat is normal.  A/P: 1. Apneic spells with no documented hx of OSA-she doesn't fit the physical picture of a normal OSA pt. She desatted once, but quickly came back up with increase of venti mask to 50%. She has no increased WOB. Stat ABG is completely normal. She is anxious. No chest pain. Given surgery plus hip fx repair 5 weeks ago, will check Ddimer and consider CTA chest to r/o PE. Perhaps she needs CPAP at night? Ativan x 1 for anxiety.  Report given to oncoming attending.  Jimmye Norman, NP [facility]

## 2013-08-15 NOTE — Progress Notes (Addendum)
2 Days Post-Op  Subjective: Somnolent this am, episodes overnight noted, no flatus or bm best I can tell  Objective: Vital signs in last 24 hours: Temp:  [97.2 F (36.2 C)-98.8 F (37.1 C)] 98.8 F (37.1 C) (11/20 0707) Pulse Rate:  [60-88] 68 (11/20 0700) Resp:  [10-29] 12 (11/20 0700) BP: (107-138)/(39-77) 138/58 mmHg (11/20 0700) SpO2:  [94 %-100 %] 100 % (11/20 0700) Weight:  [143 lb 4.8 oz (65 kg)] 143 lb 4.8 oz (65 kg) (11/19 1300)    Intake/Output from previous day: 11/19 0701 - 11/20 0700 In: 2882.3 [P.O.:50; I.V.:2407.3; IV Piggyback:425] Out: 1440 [Urine:870; Emesis/NG output:500; Drains:70] Intake/Output this shift:    General appearance: slowed mentation Resp: diminished breath sounds bibasilar Cardio: regular rate and rhythm GI: mild tender, mild distention, few bs, right groin incision clean without infection, jp serous  Lab Results:   Recent Labs  08/14/13 0420 08/15/13 0438  WBC 7.0 6.2  HGB 12.1 10.7*  HCT 35.3* 32.2*  PLT 283 230   BMET  Recent Labs  08/14/13 0420 08/15/13 0438  NA 135 139  K 3.3* 3.3*  CL 98 105  CO2 28 28  GLUCOSE 126* 156*  BUN 32* 23  CREATININE 0.94 0.83  CALCIUM 7.5* 7.2*   PT/INR No results found for this basename: LABPROT, INR,  in the last 72 hours ABG  Recent Labs  08/15/13 0614  PHART 7.438  HCO3 25.8*    Studies/Results: Ct Abdomen Pelvis W Contrast  08/13/2013   CLINICAL DATA:  Nausea, vomiting  EXAM: CT ABDOMEN AND PELVIS WITH CONTRAST  TECHNIQUE: Multidetector CT imaging of the abdomen and pelvis was performed using the standard protocol following bolus administration of intravenous contrast.  CONTRAST:  80mL OMNIPAQUE IOHEXOL 300 MG/ML  SOLN  COMPARISON:  None.  FINDINGS: The lung bases are clear.  The liver demonstrates no focal abnormality. There is no intrahepatic or extrahepatic biliary ductal dilatation. The gallbladder is surgically absent. The spleen demonstrates no focal abnormality. The  adrenal glands and pancreas are normal. There are multiple small hypodensities measuring less than 1 cm bilaterally, left greater than right, which are too small to characterize. The bladder is unremarkable.  There are multiple distended loops of small bowel with multiple air air-fluid levels. The stomach is distended and fluid-filled. There is a right inguinal hernia containing a dilated afferent loop and a decompressed efferent loop consistent with the site of transition. There is no pneumoperitoneum, pneumatosis, or portal venous gas. There is a small amount of pelvic free fluid. There is no lymphadenopathy.  The abdominal aorta is normal in caliber with atherosclerosis.  There are no lytic or sclerotic osseous lesions. There is a left hip arthroplasty.  IMPRESSION: High-grade small bowel obstruction resulting from a right inguinal hernia.   Electronically Signed   By: Elige Ko   On: 08/13/2013 15:03   Dg Chest Port 1 View  08/15/2013   CLINICAL DATA:  Hernia repair.  EXAM: PORTABLE CHEST - 1 VIEW  COMPARISON:  08/13/2013.  FINDINGS: NG tube noted coiled over stomach. Cardiac pacer with stable lead positioning noted. Mediastinum and hilar structures are normal. Interim development of right and left lower lobe infiltrates are noted. Interim development of a prominent area of subsegmental atelectasis right mid lung field noted. Heart size and pulmonary vascularity normal. No significant pleural effusion or pneumothorax. No acute osseous abnormality.  IMPRESSION: 1. Interim development of bibasilar pulmonary infiltrates suggesting pneumonia. Interim development of prominent subsegmental atelectasis right mid lung  field. 2. Cardiac pacer.  Cardiovascular structures stable. 3. NG tube noted projected over stomach.   Electronically Signed   By: Maisie Fus  Register   On: 08/15/2013 07:30   Dg Chest Portable 1 View  08/13/2013   CLINICAL DATA:  Nausea, vomiting  EXAM: PORTABLE CHEST - 1 VIEW  COMPARISON:   07/01/2013  FINDINGS: Dual lead left-sided pacer in place. Nasogastric tube is appropriately positioned. Heart size is normal. No focal pulmonary opacity.  IMPRESSION: No acute cardiopulmonary process. Nasogastric tube appropriately positioned.   Electronically Signed   By: Christiana Pellant M.D.   On: 08/13/2013 18:29    Anti-infectives: Anti-infectives   Start     Dose/Rate Route Frequency Ordered Stop   08/14/13 0031  piperacillin-tazobactam (ZOSYN) IVPB 3.375 g  Status:  Discontinued     3.375 g 12.5 mL/hr over 240 Minutes Intravenous 3 times per day 08/14/13 0031 08/15/13 0739   08/13/13 1715  piperacillin-tazobactam (ZOSYN) IVPB 3.375 g     3.375 g 12.5 mL/hr over 240 Minutes Intravenous 3 times per day 08/13/13 1700        Assessment/Plan: POD 2 primary repair incarcerated femoral hernia with sbr  1. Cont prn pain meds, neurologic status is decreased today, will check ct head but I think this is due to meds/pulm status currently 2. Cont prn lopressor 3. pulm toilet, will check ct rule out pe but I am concerned may have aspiration pna as she is at high risk, will ask ccm to see her today, her abg looks well but there is chance if she does not improve she may require ett 4. Npo, cont ng tube for now, cont jp drain 5. Zosyn day 2/7 for operative findings, may need broadened for possible pna if ct negative 6. Lovenox, scds, protonix   [facility] 08/15/2013

## 2013-08-15 NOTE — Progress Notes (Signed)
MD text notified of more frequent apnea events, pts desaturation into the 40's with longer recovery periods. Pt is oriented but slurring words more and not as easy to wake as before. Pt placed on a 50% venti mask and PCA has been D/C'd per MD orders with previous page.

## 2013-08-15 NOTE — Consult Note (Signed)
PULMONARY  / CRITICAL CARE MEDICINE  Name: [name] MRN: [id number] DOB: 08-04-1922    ADMISSION DATE:  08/13/2013 CONSULTATION DATE:  08/15/2013  REFERRING MD :  Dr. Dwain Sarna PRIMARY SERVICE: [facility]  CHIEF COMPLAINT:  Aspiration PNA and respiratory failure  BRIEF PATIENT DESCRIPTION: 79 year old with history of dementia and expressive aphasia who had a hip replacement in October then consequently developed bowel obstruction from a right sided incarcerated hernia who was vomiting feculent material and was noted to have aspirated, patient was taken to the OR on 11/18 for hernia repair.  On the morning of 11/20 patient was noted to be more lethargic and with tachypnea.  BP remained stable.  PCCM was called for further management.  Patient was noted to be very lethargic and airway protection was concerning.  Spoke to son who wanted everything done unless she is in a persistent vegetative state.  SIGNIFICANT EVENTS / STUDIES:  11/20 respiratory failure.  LINES / TUBES: ET tube 11/20>>> R IJ TLC 11/20>>> R radial a-line 11/20>>>  CULTURES: Blood 11/20>>> Urine 11/20>>> Sputum 11/20>>>  ANTIBIOTICS: Cefepime 11/20>>> Vancomycin 11/20>>>  PAST MEDICAL HISTORY :  Past Medical History  Diagnosis Date  . Aphasia 01/09/2009  . 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 CEREBROVASCULAR ACCIDENT, HX OF 11/20/2007  . CVA 01/09/2009  . DEMENTIA 05/19/2008  . DIVERTICULOSIS, COLON 11/20/2007  . FATIGUE 11/20/2007  . GLUCOSE INTOLERANCE 12/06/2010  . HYPERLIPIDEMIA 11/20/2007  . HYPERTENSION 11/20/2007    Echo was done 07/07/11: mod LVH, EF 55-60%, grade 1 diast dysfxn, mild MR, mild LAE, mod TR, PASP 39  . HYPOTHYROIDISM 11/20/2007  . IRON DEFICIENCY 12/06/2010  . PACEMAKER, PERMANENT 01/09/2009  . TIA 05/14/2003    elevated CE's in setting of TIA and falls in 10/12 - echo normal with no further cardiac w/u pursued  . Urinary incontinence 01/09/2009  . Impaired glucose tolerance 06/03/2011  . TIA  (transient ischemic attack) 10/10/2011  . Dementia   . CHF (congestive heart failure)   . Coronary artery disease   . Arthritis     right hand  . Pacemaker 2002  . Femoral fracture 07/01/2013   Past Surgical History  Procedure Laterality Date  . Medtronic cynthia dual chamber pacemaker serial number was [id number]...04/15/2009    . Abdominal hysterectomy    . S/p cerebral aneurysm  1991  . S/p pacemaker ddd    . Cholecystectomy    . S/p retinal attachment    . S/p thyroidectomy    . Cystocele repair N/A 11/26/2012    Procedure: Cystocele Repair with Lefort Colpocleisis.  colpectomy Levatorplasty Perineorraphy;  Surgeon: Lenoard Aden, MD;  Location: [facility];  Service: Gynecology;  Laterality: N/A;  Cystocele Repair with Lefort Colpocleisis.  . Hip arthroplasty Left 07/02/2013    Procedure: ARTHROPLASTY BIPOLAR HIP;  Surgeon: Shelda Pal, MD;  Location: [facility];  Service: Orthopedics;  Laterality: Left;   Prior to Admission medications   Medication Sig Start Date End Date Taking? Authorizing Provider  acetaminophen (TYLENOL) 325 MG tablet Take 2 tablets (650 mg total) by mouth every 6 (six) hours as needed. 07/03/13  Yes Genelle Gather Babish, PA-C  amLODipine (NORVASC) 10 MG tablet Take 1 tablet (10 mg total) by mouth daily. Resume in 3 days if SBP > 150 07/08/13  Yes Rodolph Bong, MD  atorvastatin (LIPITOR) 10 MG tablet Take 1 tablet (10 mg total) by mouth daily. 10/12/12  Yes Corwin Levins, MD  Calcium Carbonate-Vitamin D (CALTRATE  600+D) 600-400 MG-UNIT per tablet Take 1 tablet by mouth daily.     Yes Historical Provider, MD  dipyridamole-aspirin (AGGRENOX) 200-25 MG per 12 hr capsule Take 1 capsule by mouth 2 (two) times daily. 10/12/12  Yes Corwin Levins, MD  docusate sodium 100 MG CAPS Take 100 mg by mouth 2 (two) times daily. 07/03/13  Yes Genelle Gather Babish, PA-C  donepezil (ARICEPT) 10 MG tablet Take 1 tablet (10 mg total) by mouth daily. 10/12/12  Yes Corwin Levins, MD  ferrous  sulfate 325 (65 FE) MG tablet Take 1 tablet (325 mg total) by mouth 3 (three) times daily after meals. 07/03/13  Yes Genelle Gather Babish, PA-C  levothyroxine (SYNTHROID, LEVOTHROID) 137 MCG tablet Take 137 mcg by mouth daily before breakfast.   Yes Historical Provider, MD  losartan (COZAAR) 100 MG tablet Take 100 mg by mouth daily.   Yes Historical Provider, MD  methocarbamol (ROBAXIN) 500 MG tablet Take 1 tablet (500 mg total) by mouth every 6 (six) hours as needed (muscle spasms.). 07/05/13  Yes Rodolph Bong, MD  Multiple Vitamin (MULTIVITAMIN WITH MINERALS) TABS Take 1 tablet by mouth daily. Centrum silver   Yes Historical Provider, MD  polyethylene glycol (MIRALAX / GLYCOLAX) packet Take 17 g by mouth 2 (two) times daily. 07/05/13  Yes Rodolph Bong, MD  traMADol (ULTRAM) 50 MG tablet Take 1-2 tablets (50-100 mg total) by mouth every 6 (six) hours as needed for pain. 07/03/13  Yes Genelle Gather Babish, PA-C   Allergies  Allergen Reactions  . Codeine     unknown    FAMILY HISTORY:  Family History  Problem Relation Age of Onset  . Cancer Mother     breast cancer   SOCIAL HISTORY:  reports that she has never smoked. She has never used smokeless tobacco. She reports that she does not drink alcohol or use illicit drugs.  REVIEW OF SYSTEMS:  Unattainable, patient has dementia and is very confused.  SUBJECTIVE: Confused and less responsive overnight.  VITAL SIGNS: Temp:  [97.2 F (36.2 C)-98.8 F (37.1 C)] 98.8 F (37.1 C) (11/20 0707) Pulse Rate:  [64-88] 68 (11/20 0700) Resp:  [10-29] 12 (11/20 0700) BP: (107-138)/(39-77) 138/58 mmHg (11/20 0700) SpO2:  [94 %-100 %] 100 % (11/20 0700) Weight:  [65 kg (143 lb 4.8 oz)] 65 kg (143 lb 4.8 oz) (11/19 1300) HEMODYNAMICS:   VENTILATOR SETTINGS: PRVC 18, Tv 450, PEEP 5 and FiO2 of 100%.   INTAKE / OUTPUT: Intake/Output     11/19 0701 - 11/20 0700 11/20 0701 - 11/21 0700   P.O. 50    I.V. (mL/kg) 2407.3 (37)    Blood      NG/GT     IV Piggyback 425    Total Intake(mL/kg) 2882.3 (44.3)    Urine (mL/kg/hr) 870 (0.6)    Emesis/NG output 500 (0.3)    Drains 70 (0)    Other     Total Output 1440     Net +1442.3           PHYSICAL EXAMINATION: General:  Chronically ill appearing female, moving all ext to pain. Neuro:  Confused but interactive and moving all ext to pain. HEENT:  [place]/AT, PERRL, EOM-I and MMM. Cardiovascular:  RRR, Nl S1/S2, -M/R/G. Lungs:  Diffuse crackles. Abdomen:  Soft, diffusely tender, hypoactive bowel sounds, ND. Musculoskeletal:  -edema and -tenderness. Skin:  Intact.  LABS:  CBC  Recent Labs Lab 08/13/13 1155 08/14/13 0420 08/15/13 0438  WBC 19.7*  7.0 6.2  HGB 14.8 12.1 10.7*  HCT 41.1 35.3* 32.2*  PLT 309 283 230   Coag's No results found for this basename: APTT, INR,  in the last 168 hours BMET  Recent Labs Lab 08/13/13 1155 08/14/13 0420 08/15/13 0438  NA 132* 135 139  K 3.2* 3.3* 3.3*  CL 88* 98 105  CO2 30 28 28   BUN 41* 32* 23  CREATININE 1.29* 0.94 0.83  GLUCOSE 134* 126* 156*   Electrolytes  Recent Labs Lab 08/13/13 1155 08/14/13 0420 08/15/13 0438  CALCIUM 9.0 7.5* 7.2*   Sepsis Markers  Recent Labs Lab 08/13/13 1330  LATICACIDVEN 1.1   ABG  Recent Labs Lab 08/15/13 0614  PHART 7.438  PCO2ART 38.2  PO2ART 91.0   Liver Enzymes  Recent Labs Lab 08/13/13 1155  AST 52*  ALT 32  ALKPHOS 88  BILITOT 0.7  ALBUMIN 3.4*   Cardiac Enzymes No results found for this basename: TROPONINI, PROBNP,  in the last 168 hours Glucose No results found for this basename: GLUCAP,  in the last 168 hours  Imaging Ct Abdomen Pelvis W Contrast  08/13/2013   CLINICAL DATA:  Nausea, vomiting  EXAM: CT ABDOMEN AND PELVIS WITH CONTRAST  TECHNIQUE: Multidetector CT imaging of the abdomen and pelvis was performed using the standard protocol following bolus administration of intravenous contrast.  CONTRAST:  80mL OMNIPAQUE IOHEXOL 300 MG/ML  SOLN   COMPARISON:  None.  FINDINGS: The lung bases are clear.  The liver demonstrates no focal abnormality. There is no intrahepatic or extrahepatic biliary ductal dilatation. The gallbladder is surgically absent. The spleen demonstrates no focal abnormality. The adrenal glands and pancreas are normal. There are multiple small hypodensities measuring less than 1 cm bilaterally, left greater than right, which are too small to characterize. The bladder is unremarkable.  There are multiple distended loops of small bowel with multiple air air-fluid levels. The stomach is distended and fluid-filled. There is a right inguinal hernia containing a dilated afferent loop and a decompressed efferent loop consistent with the site of transition. There is no pneumoperitoneum, pneumatosis, or portal venous gas. There is a small amount of pelvic free fluid. There is no lymphadenopathy.  The abdominal aorta is normal in caliber with atherosclerosis.  There are no lytic or sclerotic osseous lesions. There is a left hip arthroplasty.  IMPRESSION: High-grade small bowel obstruction resulting from a right inguinal hernia.   Electronically Signed   By: Elige Ko   On: 08/13/2013 15:03   Dg Chest Port 1 View  08/15/2013   CLINICAL DATA:  Hernia repair.  EXAM: PORTABLE CHEST - 1 VIEW  COMPARISON:  08/13/2013.  FINDINGS: NG tube noted coiled over stomach. Cardiac pacer with stable lead positioning noted. Mediastinum and hilar structures are normal. Interim development of right and left lower lobe infiltrates are noted. Interim development of a prominent area of subsegmental atelectasis right mid lung field noted. Heart size and pulmonary vascularity normal. No significant pleural effusion or pneumothorax. No acute osseous abnormality.  IMPRESSION: 1. Interim development of bibasilar pulmonary infiltrates suggesting pneumonia. Interim development of prominent subsegmental atelectasis right mid lung field. 2. Cardiac pacer.  Cardiovascular  structures stable. 3. NG tube noted projected over stomach.   Electronically Signed   By: Maisie Fus  Register   On: 08/15/2013 07:30   Dg Chest Portable 1 View  08/13/2013   CLINICAL DATA:  Nausea, vomiting  EXAM: PORTABLE CHEST - 1 VIEW  COMPARISON:  07/01/2013  FINDINGS: Dual lead  left-sided pacer in place. Nasogastric tube is appropriately positioned. Heart size is normal. No focal pulmonary opacity.  IMPRESSION: No acute cardiopulmonary process. Nasogastric tube appropriately positioned.   Electronically Signed   By: Christiana Pellant M.D.   On: 08/13/2013 18:29   CXR: Bibasilar infiltrate R>L.  ASSESSMENT / PLAN:  PULMONARY A: VDRF due to aspiration and evolving aspiration PNA. P:   - Intubate. - Mechanically ventilate. - Place TLC and A-line. - F/U ABG. - Abx per ID. - Pan culture. - Adjust vent to ABG. - CXR and ABG in AM.  CARDIOVASCULAR A: Sepsis and hypotension. P:  - Place TLC. - CVP checks. - Treat sepsis. - Will consider sepsis protocol if BP deteriorates.  RENAL A:  Renal function was poor but improving now.  Anticipate as BP deteriorates renal function will deteriorate. P:   - Replace electrolytes as needed. - BMET in AM. - Potassium IV replacement.  GASTROINTESTINAL A:  S/P hernia repair. P:   - NPO. - TPN. - Further recommendations per surgery.  HEMATOLOGIC A:  Stable, no active issues. P:  - Monitor CBC. - Transfusion threshold is 7.  INFECTIOUS A:  Aspiration PNA. P:   - D/C zosyn. - Start cefepime and vancomycin per aspiration PNA. - Pan culture.  ENDOCRINE A:  No diabetes history.   P:   - Monitor CBGs as needed.  NEUROLOGIC A:  Confusion likely due to sepsis.  Neuro exam is completely non-focal.  CCS ordered a head CT. P:   - F/U head CT. - PRN sedation protocol.  TODAY'S SUMMARY: 79 year old female with dementia that is reportedly functional, s/p hernia repair, aspirated and decompensated.  Spoke with son (ESQ) who requested  everything done unless she is in a persistent vegetative state.  Will intubate, mechanically ventilate and adjust abx.  Maintain on PRN sedation for now and will continue to monitor.  I have personally obtained a history, examined the patient, evaluated laboratory and imaging results, formulated the assessment and plan and placed orders.  CRITICAL CARE: The patient is critically ill with multiple organ systems failure and requires high complexity decision making for assessment and support, frequent evaluation and titration of therapies, application of advanced monitoring technologies and extensive interpretation of multiple databases. Critical Care Time devoted to patient care services described in this note is 45 minutes.   Alyson Reedy, M.D. Pulmonary and Critical Care Medicine [facility] Pager: [phone]  08/15/2013, 8:19 AM

## 2013-08-15 NOTE — Procedures (Signed)
Central Venous Catheter Insertion Procedure Note [name] [id number] 1922/06/27  Procedure: Insertion of Central Venous Catheter Indications: Assessment of intravascular volume, Drug and/or fluid administration and Frequent blood sampling  Procedure Details Consent: Risks of procedure as well as the alternatives and risks of each were explained to the (patient/caregiver).  Consent for procedure obtained. Time Out: Verified patient identification, verified procedure, site/side was marked, verified correct patient position, special equipment/implants available, medications/allergies/relevent history reviewed, required imaging and test results available.  Performed  Maximum sterile technique was used including antiseptics, cap, gloves, gown, hand hygiene, mask and sheet. Skin prep: Chlorhexidine; local anesthetic administered A antimicrobial bonded/coated triple lumen catheter was placed in the right internal jugular vein using the Seldinger technique.  Evaluation Blood flow good Complications: No apparent complications Patient did tolerate procedure well. Chest X-ray ordered to verify placement.  CXR: pending.  U/S used in placement.  [name] 08/15/2013, 8:53 AM

## 2013-08-15 NOTE — Progress Notes (Signed)
MD at bedside, STAT ABG ordered, RT at bedside to draw ABG.

## 2013-08-15 NOTE — Progress Notes (Addendum)
14mg  dilaudid PCA wasted in sink with Okey Dupre, RN  Wasted 14mg  pf dilaudid PCA in sink with Viviano Simas, RN

## 2013-08-15 NOTE — Procedures (Signed)
Arterial Catheter Insertion Procedure Note [name] [id number] [birth year]  Procedure: Insertion of Arterial Catheter  Indications: Blood pressure monitoring  Procedure Details Consent: Risks of procedure as well as the alternatives and risks of each were explained to the (patient/caregiver).  Consent for procedure obtained. Time Out: Verified patient identification, verified procedure, site/side was marked, verified correct patient position, special equipment/implants available, medications/allergies/relevent history reviewed, required imaging and test results available.  Performed  Maximum sterile technique was used including antiseptics, cap, gloves, gown, hand hygiene, mask and sheet. Skin prep: Chlorhexidine; local anesthetic administered 20 gauge catheter was inserted into right radial artery using the Seldinger technique.  Evaluation Blood flow good; BP tracing good. Complications: No apparent complications.   [name] 08/15/2013

## 2013-08-16 ENCOUNTER — Encounter (HOSPITAL_COMMUNITY): Payer: Self-pay | Admitting: General Surgery

## 2013-08-16 ENCOUNTER — Inpatient Hospital Stay (HOSPITAL_COMMUNITY): Payer: Medicare Other

## 2013-08-16 DIAGNOSIS — J690 Pneumonitis due to inhalation of food and vomit: Secondary | ICD-10-CM

## 2013-08-16 DIAGNOSIS — J9600 Acute respiratory failure, unspecified whether with hypoxia or hypercapnia: Secondary | ICD-10-CM

## 2013-08-16 LAB — CBC
HCT: 31.7 % — ABNORMAL LOW (ref 36.0–46.0)
Hemoglobin: 10.4 g/dL — ABNORMAL LOW (ref 12.0–15.0)
MCH: 31.3 pg (ref 26.0–34.0)
MCH: 31.7 pg (ref 26.0–34.0)
MCHC: 32.8 g/dL (ref 30.0–36.0)
MCHC: 33.6 g/dL (ref 30.0–36.0)
MCV: 95.5 fL (ref 78.0–100.0)
MCV: 94.4 fL (ref 78.0–100.0)
Platelets: 242 10*3/uL (ref 150–400)
RBC: 3.32 MIL/uL — ABNORMAL LOW (ref 3.87–5.11)
RBC: 3.19 MIL/uL — ABNORMAL LOW (ref 3.87–5.11)
RDW: 13 % (ref 11.5–15.5)
WBC: 7.1 10*3/uL (ref 4.0–10.5)

## 2013-08-16 LAB — POCT I-STAT 3, ART BLOOD GAS (G3+)
Acid-Base Excess: 1 mmol/L (ref 0.0–2.0)
Bicarbonate: 25.3 mEq/L — ABNORMAL HIGH (ref 20.0–24.0)
O2 Saturation: 96 %
pCO2 arterial: 39.6 mmHg (ref 35.0–45.0)

## 2013-08-16 LAB — DIFFERENTIAL
Basophils Absolute: 0 10*3/uL (ref 0.0–0.1)
Basophils Relative: 0 % (ref 0–1)
Lymphocytes Relative: 15 % (ref 12–46)
Lymphs Abs: 1 10*3/uL (ref 0.7–4.0)
Monocytes Relative: 12 % (ref 3–12)
Neutro Abs: 4.3 10*3/uL (ref 1.7–7.7)
Neutrophils Relative %: 67 % (ref 43–77)

## 2013-08-16 LAB — COMPREHENSIVE METABOLIC PANEL
Alkaline Phosphatase: 57 U/L (ref 39–117)
BUN: 18 mg/dL (ref 6–23)
CO2: 27 mEq/L (ref 19–32)
Chloride: 109 mEq/L (ref 96–112)
Creatinine, Ser: 0.6 mg/dL (ref 0.50–1.10)
Glucose, Bld: 149 mg/dL — ABNORMAL HIGH (ref 70–99)
Potassium: 3.1 mEq/L — ABNORMAL LOW (ref 3.5–5.1)
Sodium: 141 mEq/L (ref 135–145)
Total Bilirubin: 0.2 mg/dL — ABNORMAL LOW (ref 0.3–1.2)

## 2013-08-16 LAB — TRIGLYCERIDES: Triglycerides: 89 mg/dL (ref <150)

## 2013-08-16 LAB — URINE CULTURE: Culture: NO GROWTH

## 2013-08-16 LAB — GLUCOSE, CAPILLARY
Glucose-Capillary: 124 mg/dL — ABNORMAL HIGH (ref 70–99)
Glucose-Capillary: 140 mg/dL — ABNORMAL HIGH (ref 70–99)
Glucose-Capillary: 147 mg/dL — ABNORMAL HIGH (ref 70–99)

## 2013-08-16 LAB — MAGNESIUM: Magnesium: 2.2 mg/dL (ref 1.5–2.5)

## 2013-08-16 MED ORDER — TRACE MINERALS CR-CU-F-FE-I-MN-MO-SE-ZN IV SOLN
INTRAVENOUS | Status: AC
  Administered 2013-08-16: 18:00:00 via INTRAVENOUS
  Filled 2013-08-16: qty 2000

## 2013-08-16 MED ORDER — POTASSIUM CHLORIDE 10 MEQ/50ML IV SOLN
10.0000 meq | INTRAVENOUS | Status: AC
  Administered 2013-08-16 (×2): 10 meq via INTRAVENOUS
  Filled 2013-08-16 (×2): qty 50

## 2013-08-16 MED ORDER — DEXTROSE-NACL 5-0.9 % IV SOLN
INTRAVENOUS | Status: DC
  Administered 2013-08-16: 18:00:00 via INTRAVENOUS

## 2013-08-16 MED ORDER — SODIUM PHOSPHATE 3 MMOLE/ML IV SOLN
30.0000 mmol | Freq: Once | INTRAVENOUS | Status: AC
  Administered 2013-08-16: 30 mmol via INTRAVENOUS
  Filled 2013-08-16: qty 10

## 2013-08-16 MED ORDER — FENTANYL CITRATE 0.05 MG/ML IJ SOLN: 12.5000 ug | INTRAMUSCULAR | Status: DC | PRN

## 2013-08-16 MED ORDER — FAT EMULSION 20 % IV EMUL
250.0000 mL | INTRAVENOUS | Status: AC
  Administered 2013-08-16: 250 mL via INTRAVENOUS
  Filled 2013-08-16: qty 250

## 2013-08-16 MED ORDER — POTASSIUM CHLORIDE 10 MEQ/50ML IV SOLN
10.0000 meq | INTRAVENOUS | Status: AC
  Administered 2013-08-16 (×4): 10 meq via INTRAVENOUS
  Filled 2013-08-16: qty 50

## 2013-08-16 NOTE — Progress Notes (Signed)
Physical Therapy Treatment Patient Details Name: [name] MRN: [id number] DOB: Apr 01, 1922 Today's Date: 08/16/2013 Time: 4098-1191 PT Time Calculation (min): 27 min  PT Assessment / Plan / Recommendation  History of Present Illness Patient admitted with N&V underwent small bowel resection and right femoral hernia repair.  s/p left hip hemiarthroplasty 5 weeks ago due to fall with femoral fracture.  She was at [facility] for rehab and prior to fall lived at [facility].   PT Comments   Pt extubated this AM however VSS and able to tolerate therapy.  Pt able to complete gait training and dynamic sitting balance.  Pt with hx of dementia however needs constant cues for task.    Follow Up Recommendations  SNF     Equipment Recommendations  None recommended by PT    Frequency Min 3X/week   Progress towards PT Goals Progress towards PT goals: Progressing toward goals  Plan Current plan remains appropriate    Precautions / Restrictions Precautions Precautions: Fall Precaution Comments: NG tube Restrictions Weight Bearing Restrictions: No   Pertinent Vitals/Pain VSS; no c/o pain    Mobility  Bed Mobility Supine to Sit: 3: Mod assist;HOB flat;With rails Sitting - Scoot to Edge of Bed: 3: Mod assist Sit to Supine: 1: +2 Total assist Sit to Supine: Patient Percentage: 60% Details for Bed Mobility Assistance: (A) to elevate trunk OOB and (A) to slowly descend into bed with cues for technique.  Transfers Transfers: Sit to Stand;Stand to Sit Sit to Stand: 3: Mod assist;2: Max assist;From bed Stand to Sit: 3: Mod assist;2: Max assist;To bed Details for Transfer Assistance: (A) to initiate forward translation with max cues for technique.  Ambulation/Gait Ambulation/Gait Assistance: 1: +2 Total assist Ambulation/Gait: Patient Percentage: 60% Ambulation Distance (Feet): 4 Feet (side steps to [facility]) Assistive device: 2 person hand held assist Ambulation/Gait Assistance  Details: (A) to maintain balance with cues for proper weight shifts to advance LE  Gait Pattern: Step-to pattern;Decreased stride length;Shuffle Gait velocity: decreased Stairs: No    Exercises Total Joint Exercises Ankle Circles/Pumps: AROM;Both;10 reps;Supine Short Arc Quad: AROM;Left;10 reps;Supine   PT Diagnosis:    PT Problem List:   PT Treatment Interventions:     PT Goals (current goals can now be found in the care plan section) Acute Rehab PT Goals Patient Stated Goal: To return to independent PT Goal Formulation: With patient/family Time For Goal Achievement: 08/28/13 Potential to Achieve Goals: Good  Visit Information  Last PT Received On: 08/16/13 Assistance Needed: +2 History of Present Illness: Patient admitted with N&V underwent small bowel resection and right femoral hernia repair.  s/p left hip hemiarthroplasty 5 weeks ago due to fall with femoral fracture.  She was at [facility] for rehab and prior to fall lived at [facility].    Subjective Data  Subjective: "I'll do what you ask me to. " Patient Stated Goal: To return to independent   Cognition  Cognition Arousal/Alertness: Awake/alert Behavior During Therapy: WFL for tasks assessed/performed Overall Cognitive Status: Impaired/Different from baseline Area of Impairment: Orientation Orientation Level: Situation General Comments: Pt slow to process and needs extra to complete task.  However pt has hx of dementia.     Balance  Static Sitting Balance Static Sitting - Balance Support: Feet unsupported;Bilateral upper extremity supported Static Sitting - Level of Assistance: 5: Stand by assistance Dynamic Sitting Balance Dynamic Sitting - Balance Support: Feet supported Dynamic Sitting - Level of Assistance: 5: Stand by assistance;4: Min assist Dynamic Sitting Balance -  Compensations: Pt completed forward and lateral reaches with occasional min (A) due to LOB towards left side.  Dynamic Standing  Balance Dynamic Standing - Balance Support: Bilateral upper extremity supported Dynamic Standing - Level of Assistance: 4: Min assist Dynamic Standing - Balance Activities: Lateral lean/weight shifting;Forward lean/weight shifting (Marching ~ 15 seconds x 2)  End of Session PT - End of Session Equipment Utilized During Treatment: Oxygen Activity Tolerance: Patient limited by fatigue Patient left: in bed;with call bell/phone within reach;with family/visitor present Nurse Communication: Mobility status   GP     [name] 08/16/2013, 4:38 PM  Jake Shark, PT DPT [phone]

## 2013-08-16 NOTE — Progress Notes (Signed)
Patient ID: [name], female   DOB: [birth year], 79 y.o.   MRN: [id number]  Subjective: Pt awake on vent.  Nods to having abdominal pain.    Objective:  Vital signs:  Filed Vitals:   08/16/13 0430 08/16/13 0500 08/16/13 0600 08/16/13 0738  BP: 139/54 141/57 146/53   Pulse: 62 62 60   Temp:    97.6 F (36.4 C)  TempSrc:    Axillary  Resp: 18 18 18    Height:      Weight:      SpO2: 99% 99% 99%        Intake/Output   Yesterday:  11/20 0701 - 11/21 0700 In: 2504 [I.V.:1187.5; NG/GT:30; IV Piggyback:660; TPN:626.5] Out: 1025 [Urine:980; Drains:45] This shift:  Total I/O In: 50 [IV Piggyback:50] Out: -   Bowel function:  BM: -  Drain: LLQ 73ml/24h serosanguinous output  Physical Exam:  General: Pt awake on vent. Chest: coarse  CV:  Pulses intact.  Regular rhythm Abdomen: hypoactive bowel sounds.  Left side incision without erythema, staples are intact.  LLQ drain with serosanguinous output.  NGT with with bilious output.    Problem List:   Principal Problem:   Incarcerated femoral hernia Active Problems:   HYPOTHYROIDISM   HYPERLIPIDEMIA   DEMENTIA   HYPERTENSION   AV BLOCK, COMPLETE   PACEMAKER, PERMANENT   Acute respiratory failure   Aspiration pneumonia   Sepsis   Altered mental status    Results:   Labs: Results for orders placed during the hospital encounter of 08/13/13 (from the past 48 hour(s))  CBC     Status: Abnormal   Collection Time    08/15/13  4:38 AM      Result Value Range   WBC 6.2  4.0 - 10.5 K/uL   RBC 3.39 (*) 3.87 - 5.11 MIL/uL   Hemoglobin 10.7 (*) 12.0 - 15.0 g/dL   HCT 40.9 (*) 81.1 - 91.4 %   MCV 95.0  78.0 - 100.0 fL   MCH 31.6  26.0 - 34.0 pg   MCHC 33.2  30.0 - 36.0 g/dL   RDW 78.2  95.6 - 21.3 %   Platelets 230  150 - 400 K/uL  BASIC METABOLIC PANEL     Status: Abnormal   Collection Time    08/15/13  4:38 AM      Result Value Range   Sodium 139  135 - 145 mEq/L   Potassium 3.3 (*) 3.5 - 5.1 mEq/L    Chloride 105  96 - 112 mEq/L   CO2 28  19 - 32 mEq/L   Glucose, Bld 156 (*) 70 - 99 mg/dL   BUN 23  6 - 23 mg/dL   Creatinine, Ser 0.86  0.50 - 1.10 mg/dL   Calcium 7.2 (*) 8.4 - 10.5 mg/dL   GFR calc non Af Amer 61 (*) >90 mL/min   GFR calc Af Amer 71 (*) >90 mL/min   Comment: (NOTE)     The eGFR has been calculated using the CKD EPI equation.     This calculation has not been validated in all clinical situations.     eGFR's persistently <90 mL/min signify possible Chronic Kidney     Disease.  POCT I-STAT 3, BLOOD GAS (G3+)     Status: Abnormal   Collection Time    08/15/13  6:14 AM      Result Value Range   pH, Arterial 7.438  7.350 - 7.450   pCO2 arterial 38.2  35.0 -  45.0 mmHg   pO2, Arterial 91.0  80.0 - 100.0 mmHg   Bicarbonate 25.8 (*) 20.0 - 24.0 mEq/L   TCO2 27  0 - 100 mmol/L   O2 Saturation 97.0     Acid-Base Excess 2.0  0.0 - 2.0 mmol/L   Patient temperature 98.7 F     Collection site RADIAL, ALLEN'S TEST ACCEPTABLE     Drawn by RT     Sample type ARTERIAL    D-DIMER, QUANTITATIVE     Status: Abnormal   Collection Time    08/15/13  6:45 AM      Result Value Range   D-Dimer, Quant 2.32 (*) 0.00 - 0.48 ug/mL-FEU   Comment:            AT THE INHOUSE ESTABLISHED CUTOFF     VALUE OF 0.48 ug/mL FEU,     THIS ASSAY HAS BEEN DOCUMENTED     IN THE LITERATURE TO HAVE     A SENSITIVITY AND NEGATIVE     PREDICTIVE VALUE OF AT LEAST     98 TO 99%.  THE TEST RESULT     SHOULD BE CORRELATED WITH     AN ASSESSMENT OF THE CLINICAL     PROBABILITY OF DVT / VTE.  POCT I-STAT 3, BLOOD GAS (G3+)     Status: Abnormal   Collection Time    08/15/13 11:34 AM      Result Value Range   pH, Arterial 7.430  7.350 - 7.450   pCO2 arterial 39.6  35.0 - 45.0 mmHg   pO2, Arterial 87.0  80.0 - 100.0 mmHg   Bicarbonate 26.3 (*) 20.0 - 24.0 mEq/L   TCO2 28  0 - 100 mmol/L   O2 Saturation 97.0     Acid-Base Excess 2.0  0.0 - 2.0 mmol/L   Patient temperature 98.4 F     Collection site  ARTERIAL LINE     Drawn by Operator     Sample type ARTERIAL    CULTURE, RESPIRATORY (NON-EXPECTORATED)     Status: None   Collection Time    08/15/13 11:40 AM      Result Value Range   Specimen Description TRACHEAL ASPIRATE     Special Requests NONE     Gram Stain PENDING     Culture       Value: NO GROWTH 1 DAY     Performed at [facility]   Report Status PENDING    CULTURE, BLOOD (ROUTINE X 2)     Status: None   Collection Time    08/15/13  1:05 PM      Result Value Range   Specimen Description BLOOD LEFT HAND     Special Requests BOTTLES DRAWN AEROBIC ONLY 8CC     Culture  Setup Time       Value: 08/15/2013 17:30     Performed at [facility]   Culture       Value:        BLOOD CULTURE RECEIVED NO GROWTH TO DATE CULTURE WILL BE HELD FOR 5 DAYS BEFORE ISSUING A FINAL NEGATIVE REPORT     Performed at [facility]   Report Status PENDING    CULTURE, BLOOD (ROUTINE X 2)     Status: None   Collection Time    08/15/13  1:15 PM      Result Value Range   Specimen Description BLOOD RIGHT ARM     Special Requests BOTTLES DRAWN AEROBIC AND ANAEROBIC  10CC     Culture  Setup Time       Value: 08/15/2013 17:30     Performed at [facility]   Culture       Value:        BLOOD CULTURE RECEIVED NO GROWTH TO DATE CULTURE WILL BE HELD FOR 5 DAYS BEFORE ISSUING A FINAL NEGATIVE REPORT     Performed at [facility]   Report Status PENDING    GLUCOSE, CAPILLARY     Status: Abnormal   Collection Time    08/15/13  1:31 PM      Result Value Range   Glucose-Capillary 149 (*) 70 - 99 mg/dL  URINALYSIS, ROUTINE W REFLEX MICROSCOPIC     Status: Abnormal   Collection Time    08/15/13  1:35 PM      Result Value Range   Color, Urine YELLOW  YELLOW   APPearance CLEAR  CLEAR   Specific Gravity, Urine 1.010  1.005 - 1.030   pH 6.5  5.0 - 8.0   Glucose, UA 100 (*) NEGATIVE mg/dL   Hgb urine dipstick NEGATIVE  NEGATIVE   Bilirubin Urine NEGATIVE   NEGATIVE   Ketones, ur NEGATIVE  NEGATIVE mg/dL   Protein, ur 30 (*) NEGATIVE mg/dL   Urobilinogen, UA 0.2  0.0 - 1.0 mg/dL   Nitrite NEGATIVE  NEGATIVE   Leukocytes, UA NEGATIVE  NEGATIVE  URINE MICROSCOPIC-ADD ON     Status: None   Collection Time    08/15/13  1:35 PM      Result Value Range   Squamous Epithelial / LPF RARE  RARE   WBC, UA 0-2  <3 WBC/hpf   RBC / HPF 0-2  <3 RBC/hpf   Bacteria, UA RARE  RARE  GLUCOSE, CAPILLARY     Status: Abnormal   Collection Time    08/15/13  5:35 PM      Result Value Range   Glucose-Capillary 124 (*) 70 - 99 mg/dL  GLUCOSE, CAPILLARY     Status: Abnormal   Collection Time    08/15/13  8:25 PM      Result Value Range   Glucose-Capillary 150 (*) 70 - 99 mg/dL  GLUCOSE, CAPILLARY     Status: Abnormal   Collection Time    08/16/13 12:40 AM      Result Value Range   Glucose-Capillary 147 (*) 70 - 99 mg/dL  CBC     Status: Abnormal   Collection Time    08/16/13  4:20 AM      Result Value Range   WBC 6.4  4.0 - 10.5 K/uL   RBC 3.32 (*) 3.87 - 5.11 MIL/uL   Hemoglobin 10.4 (*) 12.0 - 15.0 g/dL   HCT 16.1 (*) 09.6 - 04.5 %   MCV 95.5  78.0 - 100.0 fL   MCH 31.3  26.0 - 34.0 pg   MCHC 32.8  30.0 - 36.0 g/dL   RDW 40.9  81.1 - 91.4 %   Platelets 238  150 - 400 K/uL  MAGNESIUM     Status: None   Collection Time    08/16/13  4:20 AM      Result Value Range   Magnesium 2.2  1.5 - 2.5 mg/dL  PHOSPHORUS     Status: Abnormal   Collection Time    08/16/13  4:20 AM      Result Value Range   Phosphorus 1.1 (*) 2.3 - 4.6 mg/dL  COMPREHENSIVE METABOLIC PANEL  Status: Abnormal   Collection Time    08/16/13  4:20 AM      Result Value Range   Sodium 141  135 - 145 mEq/L   Potassium 3.1 (*) 3.5 - 5.1 mEq/L   Chloride 109  96 - 112 mEq/L   CO2 27  19 - 32 mEq/L   Glucose, Bld 149 (*) 70 - 99 mg/dL   BUN 18  6 - 23 mg/dL   Creatinine, Ser 1.61  0.50 - 1.10 mg/dL   Calcium 7.2 (*) 8.4 - 10.5 mg/dL   Total Protein 4.7 (*) 6.0 - 8.3 g/dL    Albumin 2.0 (*) 3.5 - 5.2 g/dL   AST 15  0 - 37 U/L   ALT 13  0 - 35 U/L   Alkaline Phosphatase 57  39 - 117 U/L   Total Bilirubin 0.2 (*) 0.3 - 1.2 mg/dL   GFR calc non Af Amer 79 (*) >90 mL/min   GFR calc Af Amer >90  >90 mL/min   Comment: (NOTE)     The eGFR has been calculated using the CKD EPI equation.     This calculation has not been validated in all clinical situations.     eGFR's persistently <90 mL/min signify possible Chronic Kidney     Disease.  DIFFERENTIAL     Status: Abnormal   Collection Time    08/16/13  4:20 AM      Result Value Range   Neutrophils Relative % 67  43 - 77 %   Neutro Abs 4.3  1.7 - 7.7 K/uL   Lymphocytes Relative 15  12 - 46 %   Lymphs Abs 1.0  0.7 - 4.0 K/uL   Monocytes Relative 12  3 - 12 %   Monocytes Absolute 0.8  0.1 - 1.0 K/uL   Eosinophils Relative 6 (*) 0 - 5 %   Eosinophils Absolute 0.4  0.0 - 0.7 K/uL   Basophils Relative 0  0 - 1 %   Basophils Absolute 0.0  0.0 - 0.1 K/uL  GLUCOSE, CAPILLARY     Status: Abnormal   Collection Time    08/16/13  4:23 AM      Result Value Range   Glucose-Capillary 145 (*) 70 - 99 mg/dL  POCT I-STAT 3, BLOOD GAS (G3+)     Status: Abnormal   Collection Time    08/16/13  4:24 AM      Result Value Range   pH, Arterial 7.413  7.350 - 7.450   pCO2 arterial 39.6  35.0 - 45.0 mmHg   pO2, Arterial 79.0 (*) 80.0 - 100.0 mmHg   Bicarbonate 25.3 (*) 20.0 - 24.0 mEq/L   TCO2 27  0 - 100 mmol/L   O2 Saturation 96.0     Acid-Base Excess 1.0  0.0 - 2.0 mmol/L   Patient temperature 98.3 F     Sample type ARTERIAL    TRIGLYCERIDES     Status: None   Collection Time    08/16/13  4:30 AM      Result Value Range   Triglycerides 89  <150 mg/dL    Imaging / Studies: Ct Head Wo Contrast  08/15/2013   CLINICAL DATA:  Depressed mental status.  History of stroke.  EXAM: CT HEAD WITHOUT CONTRAST  TECHNIQUE: Contiguous axial images were obtained from the base of the skull through the vertex without contrast.   COMPARISON:  07/06/2011.  FINDINGS: Atrophy with chronic microvascular ischemic change. Previous left MCA aneurysm clipping with  associated left frontal, temporal, and parietal cerebral infarction. Small left basal ganglia lacune. Slight depression frontotemporal craniotomy.  No acute stroke or hemorrhage. No mass lesion, hydrocephalus, or visible extra-axial fluid. Advanced vascular calcification. No sinus or mastoid disease. Previous ocular surgery.  Compared with priors, there has been slight progression of atrophy and chronic microvascular ischemic change.  IMPRESSION: Chronic changes as described. Slight progression of atrophy with chronic microvascular ischemic change. Remote left MCA territory infarction. No acute hemorrhage or visible acute cerebral infarction.   Electronically Signed   By: Davonna Belling M.D.   On: 08/15/2013 11:12   Ct Angio Chest Pe W/cm &/or Wo Cm  08/15/2013   CLINICAL DATA:  History of left total hip arthroplasty an incarcerated right inguinal hernia repair now presenting with shortness of breath.  EXAM: CT ANGIOGRAPHY CHEST WITH CONTRAST  TECHNIQUE: Multidetector CT imaging of the chest was performed using the standard protocol during bolus administration of intravenous contrast. Multiplanar CT image reconstructions including MIPs were obtained to evaluate the vascular anatomy.  CONTRAST:  150 mm of Omnipaque 350.  COMPARISON:  No priors.  FINDINGS: Comment: This is a limited examination. There was a malfunction of the scan after the initial injection of contrast material such that this scanner did not trigger image acquisition. A 2nd smaller injection of half dose contrast was administered, and images were acquired, however, systemic arterial phase images were acquired (rather than pulmonary arterial phase), limiting assessment of the pulmonary arterial tree.  Mediastinum: Attenuation of the blood pole within the main pulmonary arteries is approximately 156 HU, severely limiting  assessment for pulmonary embolism. With these limitations in mind, no large saddle embolus or definite lobar embolus is identified. Segmental and subsegmental sized emboli cannot be excluded on today's examination. Heart size is mildly enlarged. There is no significant pericardial fluid, thickening or pericardial calcification. There is atherosclerosis of the thoracic aorta, the great vessels of the mediastinum and the coronary arteries, including calcified atherosclerotic plaque in the left main, left anterior descending, left circumflex and right coronary arteries. Extensive mitral annular calcifications. Mild aortic valve calcifications. Left-sided pacemaker device with lead tips terminating in the right atrial appendage and right ventricular apex. No pathologically enlarged mediastinal or hilar lymph nodes. Esophagus is unremarkable in appearance. Nasogastric tube extends into the stomach, but the tip extends below the lower margin of the images. Right internal jugular central venous catheter with tip terminating at the superior cavoatrial junction. The patient is intubated, with the tip of the endotracheal tube lying approximately 2.1 cm above the carina.  Lungs/Pleura: Small bilateral pleural effusions are simple in appearance and layer dependently. Extensive passive atelectasis is noted throughout the lower lobes of the lungs bilaterally. No definite consolidative airspace disease. Focal areas of architectural distortion are noted in the inferior segment of the lingula and medial segment of the right middle lobe, and there is some associated mild cylindrical bronchiectasis in the medial segment of the right middle lobe; these findings are most compatible with areas chronic scarring.  Upper Abdomen: 11 mm simple cyst in the upper pole of the left kidney. Mild intrahepatic biliary ductal dilatation appears unchanged compared to the prior study and is likely chronic in this patient.  Musculoskeletal: There are  no aggressive appearing lytic or blastic lesions noted in the visualized portions of the skeleton.  Review of the MIP images confirms the above findings.  IMPRESSION: 1. Exceedingly limited examination due to suboptimal contrast bolus timing. While there is no central saddle embolus  or lobar embolus on today's study, segmental and subsegmental sized emboli cannot be entirely excluded. These findings and limitations were discussed by phone with Dr. Molli Knock by Dr. Llana Aliment on 08/15/2013 at 11:15 a.m. 2. Low lung volumes with extensive passive atelectasis throughout the lower lobes of lungs bilaterally. 3. Small bilateral pleural effusions are simple in appearance and layer dependently. 4. Mild cardiomegaly. 5. Atherosclerosis, including left main and 3 vessel coronary artery disease. 6. Support apparatus and additional incidental findings, as above.   Electronically Signed   By: Trudie Reed M.D.   On: 08/15/2013 11:32   Dg Chest Port 1 View  08/16/2013   CLINICAL DATA:  Endotracheal tube position.  EXAM: PORTABLE CHEST - 1 VIEW  COMPARISON:  08/15/2013 CT and chest x-ray.  FINDINGS: Endotracheal tube tip 2.8 cm above the carina.  Right central line tip mid to distal superior vena cava level. Evaluation limited by overlying structures.  Sequential pacemaker enters from the left.  No gross pneumothorax.  Consolidation lung bases consistent with atelectasis/ infiltrates with small pleural effusions. Please see recent CT report.  Pulmonary vascular congestion most notable centrally.  Cardiomegaly.  Calcified tortuous aorta.  IMPRESSION: Endotracheal tube tip 2.8 cm above the carina.  Please see above.   Electronically Signed   By: Bridgett Larsson M.D.   On: 08/16/2013 07:40   Dg Chest Portable 1 View  08/15/2013   CLINICAL DATA:  ET tube and central line placement.  EXAM: PORTABLE CHEST - 1 VIEW  COMPARISON:  Portable chest earlier in the day.  FINDINGS: Transvenous pacer with atrial and ventricular leads good  position. ET tube 38 mm above carina. Central venous catheter tip is overlapped by telemetry leads, pacer wires, and nasogastric tube but appears to lie at at or near the cavoatrial junction. There is no pneumothorax. The heart is enlarged and there persists bibasilar opacities left greater than right. Nasogastric tube is coiled in the stomach.  IMPRESSION: Satisfactory ETT placement. Central venous catheter tip likely lies at the cavoatrial junction. Attention to location on follow-up. No pneumothorax. Persistent bibasilar opacities left greater than right.   Electronically Signed   By: Davonna Belling M.D.   On: 08/15/2013 10:10   Dg Chest Port 1 View  08/15/2013   CLINICAL DATA:  Hernia repair.  EXAM: PORTABLE CHEST - 1 VIEW  COMPARISON:  08/13/2013.  FINDINGS: NG tube noted coiled over stomach. Cardiac pacer with stable lead positioning noted. Mediastinum and hilar structures are normal. Interim development of right and left lower lobe infiltrates are noted. Interim development of a prominent area of subsegmental atelectasis right mid lung field noted. Heart size and pulmonary vascularity normal. No significant pleural effusion or pneumothorax. No acute osseous abnormality.  IMPRESSION: 1. Interim development of bibasilar pulmonary infiltrates suggesting pneumonia. Interim development of prominent subsegmental atelectasis right mid lung field. 2. Cardiac pacer.  Cardiovascular structures stable. 3. NG tube noted projected over stomach.   Electronically Signed   By: Maisie Fus  Register   On: 08/15/2013 07:30    Medications / Allergies: per chart  Antibiotics: Anti-infectives   Start     Dose/Rate Route Frequency Ordered Stop   08/15/13 1000  vancomycin (VANCOCIN) IVPB 1000 mg/200 mL premix     1,000 mg 200 mL/hr over 60 Minutes Intravenous Every 24 hours 08/15/13 0853     08/15/13 1000  ceFEPIme (MAXIPIME) 1 g in dextrose 5 % 50 mL IVPB     1 g 100 mL/hr over 30 Minutes Intravenous Every  24 hours  08/15/13 0856     08/14/13 0031  piperacillin-tazobactam (ZOSYN) IVPB 3.375 g  Status:  Discontinued     3.375 g 12.5 mL/hr over 240 Minutes Intravenous 3 times per day 08/14/13 0031 08/15/13 0739   08/13/13 1715  piperacillin-tazobactam (ZOSYN) IVPB 3.375 g  Status:  Discontinued     3.375 g 12.5 mL/hr over 240 Minutes Intravenous 3 times per day 08/13/13 1700 08/15/13 0852      Assessment/Plan Hx CVA  Hypothyroidism  CAD, CHF, AVB s/p pacemaker  Hypertension  Dementia, mild Hypokalemia Aspiration PNA Sepsis  VDRF High grade small bowel obstruction secondary to femoral hernia inguinal hernia  -s/p primary repair of right femoral hernia with JP drain placement Dr. Derrell Lolling 08/13/13 -POD #3 -Appreciate CCM assistance -Continue NPO, TPN -Continue drain -Routine dressing care -Zosyn changed to cefepime and Vanc day #2, needs a total of 7 days of atbx for abdominal surgery   Ashok Norris, [facility] Surgery Pager [phone] Office [phone]  08/16/2013 8:16 AM

## 2013-08-16 NOTE — Progress Notes (Signed)
PULMONARY  / CRITICAL CARE MEDICINE  Name: [name] MRN: [id number] DOB: 1922/03/13    ADMISSION DATE:  08/13/2013 CONSULTATION DATE:  08/15/2013  REFERRING MD :  Dr. Dwain Sarna PRIMARY SERVICE: CCS  CHIEF COMPLAINT:  Aspiration PNA and respiratory failure  BRIEF PATIENT DESCRIPTION: 79 year old with history of dementia and expressive aphasia who had a hip replacement in October then consequently developed bowel obstruction from a right sided incarcerated hernia who was vomiting feculent material and was noted to have aspirated, patient was taken to the OR on 11/18 for hernia repair.  On the morning of 11/20 patient was noted to be more lethargic and with tachypnea.  BP remained stable.  PCCM was called for further management.  Patient was noted to be very lethargic and airway protection was concerning.  Spoke to son who wanted everything done unless she is in a persistent vegetative state.  SIGNIFICANT EVENTS / STUDIES:  11/20 respiratory failure 11/20 CT chest > no PE, low lung volumes, bilateral pleural effusion, cardiomegaly,  11/20 CT head > remote MCA stroke, NAICP  LINES / TUBES: ET tube 11/20>>>11/21 R IJ TLC 11/20>>> R radial a-line 11/20>>>  CULTURES: Blood 11/20>>> Urine 11/20>>> Sputum 11/20>>>  ANTIBIOTICS: Cefepime 11/20>>> Vancomycin 11/20>>>   SUBJECTIVE: doing well on vent, no pressors.  VITAL SIGNS: Temp:  [97.6 F (36.4 C)-98.5 F (36.9 C)] 97.6 F (36.4 C) (11/21 0738) Pulse Rate:  [60-74] 68 (11/21 0900) Resp:  [10-25] 17 (11/21 0900) BP: (128-168)/(53-69) 140/57 mmHg (11/21 0900) SpO2:  [97 %-100 %] 99 % (11/21 0900) Arterial Line BP: (135-170)/(52-71) 158/64 mmHg (11/21 0900) FiO2 (%):  [40 %-50 %] 40 % (11/21 0900) HEMODYNAMICS: CVP:  [7 mmHg-10 mmHg] 8 mmHg VENTILATOR SETTINGS: PRVC 18, Tv 450, PEEP 5 and FiO2 of 100%. Vent Mode:  [-] PSV FiO2 (%):  [40 %-50 %] 40 % Set Rate:  [16 bmp] 16 bmp Vt Set:  [440 mL] 440 mL PEEP:  [5  cmH20] 5 cmH20 Pressure Support:  [5 cmH20] 5 cmH20 Plateau Pressure:  [13 cmH20] 13 cmH20 INTAKE / OUTPUT: Intake/Output     11/20 0701 - 11/21 0700 11/21 0701 - 11/22 0700   P.O.     I.V. (mL/kg) 1187.5 (18.3) 50 (0.8)   NG/GT 30 30   IV Piggyback 660 50   TPN 626.5 48   Total Intake(mL/kg) 2504 (38.5) 178 (2.7)   Urine (mL/kg/hr) 980 (0.6) 35 (0.2)   Emesis/NG output  150 (0.9)   Drains 45 (0)    Total Output 1025 185   Net +1479 -7         PHYSICAL EXAMINATION: General:  Comfortable on vent, follows commands HEENT: NCAT ETT PULM: CTA B CV: RRR, no mgr AB: infrequent, soft Ext: trace edema Neuro: awake, follows commands, moves all four ext  LABS:  CBC  Recent Labs Lab 08/14/13 0420 08/15/13 0438 08/16/13 0420  WBC 7.0 6.2 6.4  HGB 12.1 10.7* 10.4*  HCT 35.3* 32.2* 31.7*  PLT 283 230 238   Coag's No results found for this basename: APTT, INR,  in the last 168 hours BMET  Recent Labs Lab 08/14/13 0420 08/15/13 0438 08/16/13 0420  NA 135 139 141  K 3.3* 3.3* 3.1*  CL 98 105 109  CO2 28 28 27   BUN 32* 23 18  CREATININE 0.94 0.83 0.60  GLUCOSE 126* 156* 149*   Electrolytes  Recent Labs Lab 08/14/13 0420 08/15/13 0438 08/16/13 0420  CALCIUM 7.5* 7.2* 7.2*  MG  --   --  2.2  PHOS  --   --  1.1*   Sepsis Markers  Recent Labs Lab 08/13/13 1330  LATICACIDVEN 1.1   ABG  Recent Labs Lab 08/15/13 0614 08/15/13 1134 08/16/13 0424  PHART 7.438 7.430 7.413  PCO2ART 38.2 39.6 39.6  PO2ART 91.0 87.0 79.0*   Liver Enzymes  Recent Labs Lab 08/13/13 1155 08/16/13 0420  AST 52* 15  ALT 32 13  ALKPHOS 88 57  BILITOT 0.7 0.2*  ALBUMIN 3.4* 2.0*   Cardiac Enzymes No results found for this basename: TROPONINI, PROBNP,  in the last 168 hours Glucose  Recent Labs Lab 08/15/13 1331 08/15/13 1735 08/15/13 2025 08/16/13 0040 08/16/13 0423 08/16/13 0905  GLUCAP 149* 124* 150* 147* 145* 140*    Imaging  CXR: Bibasilar infiltrate  R>L.  ASSESSMENT / PLAN:  PULMONARY A: VDRF due to aspiration and evolving aspiration PNA > resolving 11/20 CT chest P:   - Extubate. - Incentive spiro - aspiration precautions  CARDIOVASCULAR A: Sepsis and hypotension > resolved P:  - gentle IVF with D5  RENAL A:  Renal function stable   P:   - Replace electrolytes as needed. - BMET in AM. - replete K  GASTROINTESTINAL A:  S/P hernia repair. P:   - NPO. - TPN. - Further recommendations per surgery.  HEMATOLOGIC A:  Stable, no active issues. P:  - Monitor CBC. - Transfusion threshold is 7.  INFECTIOUS A:  Aspiration PNA. P:   - Cont cefepime and vancomycin  - wean vanc 11/22 - F/U culture.  ENDOCRINE A:  No diabetes history.   P:   - Monitor CBGs as needed.  NEUROLOGIC  A:  Acute encephalopathy resolving   P:   - F/U head CT. - PRN sedation protocol.  TODAY'S SUMMARY: extubate, need to address code status  I have personally obtained a history, examined the patient, evaluated laboratory and imaging results, formulated the assessment and plan and placed orders.  CRITICAL CARE: The patient is critically ill with multiple organ systems failure and requires high complexity decision making for assessment and support, frequent evaluation and titration of therapies, application of advanced monitoring technologies and extensive interpretation of multiple databases. Critical Care Time devoted to patient care services described in this note is 35 minutes.   Yolonda Kida PCCM Pager: [phone] Cell: [phone] If no response, call [phone]   08/16/2013, 9:29 AM

## 2013-08-16 NOTE — Progress Notes (Signed)
Agree with above, would continue ng until tomorrow and then may be able to clamp/remove, extubated now, drain likely out next couple days. Thanks for ccm help

## 2013-08-16 NOTE — Progress Notes (Signed)
INITIAL NUTRITION ASSESSMENT  DOCUMENTATION CODES Per approved criteria  -Not Applicable   INTERVENTION:  TPN per pharmacy RD to follow for nutrition care plan  NUTRITION DIAGNOSIS: Inadequate oral intake related to altered GI function as evidenced by NPO status  Goal: TPN to meet > 90% of estimated nutrition needs  Monitor:  TPN prescription, weight, labs, I/O's  Reason for Assessment: new TPN  79 y.o. female  Admitting Dx: Incarcerated femoral hernia  ASSESSMENT: Patient with PMH of dementia and expressive aphasia who presented to ED from [facility] via EMS with nausea, vomiting and abdominal pain; CT abdomen showed high-grade SBO resulting from a right inguinal hernia.  Patient s/p procedure 11/18: TISSUE REPAIR OF RIGHT STRANGULATED FEMORAL HERNIA SMALL BOWEL RESECTION & ANASTOMOSIS   Patient extubated this AM.  NGT in place to LIS.  TPN started with indication of prolonged ileus.  Patient to receive TPN tonight with Clinimix E 5/15 @ 75 ml/hr and lipids @ 10 ml/hr. Provides 1758 kcal and 90 grams protein per day.  Meets 100% minimum estimated kcal needs and 100% minimum estimated protein needs.  Height: Ht Readings from Last 1 Encounters:  08/14/13 5\' 4"  (1.626 m)    Weight: Wt Readings from Last 1 Encounters:  08/14/13 143 lb 4.8 oz (65 kg)    Ideal Body Weight: 120 lb  % Ideal Body Weight: 119%  Wt Readings from Last 10 Encounters:  08/14/13 143 lb 4.8 oz (65 kg)  08/14/13 143 lb 4.8 oz (65 kg)  07/05/13 143 lb 8 oz (65.091 kg)  07/05/13 143 lb 8 oz (65.091 kg)  01/08/13 128 lb (58.06 kg)  11/26/12 135 lb (61.236 kg)  11/26/12 135 lb (61.236 kg)  11/22/12 135 lb (61.236 kg)  10/30/12 138 lb (62.596 kg)  10/26/12 138 lb 4 oz (62.71 kg)    Usual Body Weight: 135 lb  % Usual Body Weight: 105%  BMI:  Body mass index is 24.59 kg/(m^2).  Estimated Nutritional Needs: Kcal: 1600-1800 Protein: 85-95 gm Fluid: 1.6-1.8 L  Skin: abdominal  surgical incision   Diet Order: NPO  EDUCATION NEEDS: -No education needs identified at this time   Intake/Output Summary (Last 24 hours) at 08/16/13 1137 Last data filed at 08/16/13 1000  Gross per 24 hour  Intake 2728.03 ml  Output   1180 ml  Net 1548.03 ml    Labs:   Recent Labs Lab 08/14/13 0420 08/15/13 0438 08/16/13 0420  NA 135 139 141  K 3.3* 3.3* 3.1*  CL 98 105 109  CO2 28 28 27   BUN 32* 23 18  CREATININE 0.94 0.83 0.60  CALCIUM 7.5* 7.2* 7.2*  MG  --   --  2.2  PHOS  --   --  1.1*  GLUCOSE 126* 156* 149*    CBG (last 3)   Recent Labs  08/16/13 0040 08/16/13 0423 08/16/13 0905  GLUCAP 147* 145* 140*    Scheduled Meds: . antiseptic oral rinse  15 mL Mouth Rinse QID  . ceFEPime (MAXIPIME) IV  1 g Intravenous Q24H  . chlorhexidine  15 mL Mouth Rinse BID  . Chlorhexidine Gluconate Cloth  6 each Topical Daily  . enoxaparin (LOVENOX) injection  40 mg Subcutaneous Q24H  . insulin aspart  0-9 Units Subcutaneous Q6H  . levothyroxine  68.5 mcg Intravenous Daily  . mupirocin ointment  1 application Nasal BID  . ondansetron  4 mg Intravenous Q6H  . pantoprazole (PROTONIX) IV  40 mg Intravenous QHS  . potassium  chloride  10 mEq Intravenous Q1 Hr x 2  . sodium chloride  10-40 mL Intracatheter Q12H  . sodium phosphate  Dextrose 5% IVPB  30 mmol Intravenous Once  . vancomycin  1,000 mg Intravenous Q24H    Continuous Infusions: . dextrose 5 % and 0.9% NaCl 50 mL/hr at 08/15/13 2153  . dextrose 5 % and 0.9% NaCl    . Marland KitchenTPN (CLINIMIX-E) Adult 40 mL/hr at 08/15/13 1900   And  . fat emulsion 200 mL (08/15/13 1900)  . Marland KitchenTPN (CLINIMIX-E) Adult     And  . fat emulsion      Past Medical History  Diagnosis Date  . Aphasia 01/09/2009  . 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 CEREBROVASCULAR ACCIDENT, HX OF 11/20/2007  . CVA 01/09/2009  . DEMENTIA 05/19/2008  . DIVERTICULOSIS, COLON 11/20/2007  . FATIGUE 11/20/2007  . GLUCOSE INTOLERANCE 12/06/2010  . HYPERLIPIDEMIA  11/20/2007  . HYPERTENSION 11/20/2007    Echo was done 07/07/11: mod LVH, EF 55-60%, grade 1 diast dysfxn, mild MR, mild LAE, mod TR, PASP 39  . HYPOTHYROIDISM 11/20/2007  . IRON DEFICIENCY 12/06/2010  . PACEMAKER, PERMANENT 01/09/2009  . TIA 05/14/2003    elevated CE's in setting of TIA and falls in 10/12 - echo normal with no further cardiac w/u pursued  . Urinary incontinence 01/09/2009  . Impaired glucose tolerance 06/03/2011  . TIA (transient ischemic attack) 10/10/2011  . Dementia   . CHF (congestive heart failure)   . Coronary artery disease   . Arthritis     right hand  . Pacemaker 2002  . Femoral fracture 07/01/2013    Past Surgical History  Procedure Laterality Date  . Medtronic cynthia dual chamber pacemaker serial number was [id number]...04/15/2009    . Abdominal hysterectomy    . S/p cerebral aneurysm  1991  . S/p pacemaker ddd    . Cholecystectomy    . S/p retinal attachment    . S/p thyroidectomy    . Cystocele repair N/A 11/26/2012    Procedure: Cystocele Repair with Lefort Colpocleisis.  colpectomy Levatorplasty Perineorraphy;  Surgeon: Lenoard Aden, MD;  Location: [facility];  Service: Gynecology;  Laterality: N/A;  Cystocele Repair with Lefort Colpocleisis.  . Hip arthroplasty Left 07/02/2013    Procedure: ARTHROPLASTY BIPOLAR HIP;  Surgeon: Shelda Pal, MD;  Location: [facility];  Service: Orthopedics;  Laterality: Left;    Maureen Chatters, RD, LDN Pager #: [phone] After-Hours Pager #: [phone]

## 2013-08-16 NOTE — Progress Notes (Signed)
PARENTERAL NUTRITION CONSULT NOTE - FOLLOW UP  Pharmacy Consult:  TPN Indication:  Prolonged ileus  Allergies  Allergen Reactions  . Codeine     unknown    Patient Measurements: Height: 5\' 4"  (162.6 cm) Weight: 143 lb 4.8 oz (65 kg) IBW/kg (Calculated) : 54.7  Vital Signs: Temp: 98.2 F (36.8 C) (11/21 0000) Temp src: Oral (11/21 0000) BP: 146/53 mmHg (11/21 0600) Pulse Rate: 60 (11/21 0600) Intake/Output from previous day: 11/20 0701 - 11/21 0700 In: 2406 [I.V.:1137.5; NG/GT:30; IV Piggyback:660; TPN:578.5] Out: 1000 [Urine:955; Drains:45]  Labs:  Recent Labs  08/14/13 0420 08/15/13 0438 08/16/13 0420  WBC 7.0 6.2 6.4  HGB 12.1 10.7* 10.4*  HCT 35.3* 32.2* 31.7*  PLT 283 230 238     Recent Labs  08/13/13 1155 08/14/13 0420 08/15/13 0438 08/16/13 0420 08/16/13 0430  NA 132* 135 139 141  --   K 3.2* 3.3* 3.3* 3.1*  --   CL 88* 98 105 109  --   CO2 30 28 28 27   --   GLUCOSE 134* 126* 156* 149*  --   BUN 41* 32* 23 18  --   CREATININE 1.29* 0.94 0.83 0.60  --   CALCIUM 9.0 7.5* 7.2* 7.2*  --   MG  --   --   --  2.2  --   PHOS  --   --   --  1.1*  --   PROT 6.6  --   --  4.7*  --   ALBUMIN 3.4*  --   --  2.0*  --   AST 52*  --   --  15  --   ALT 32  --   --  13  --   ALKPHOS 88  --   --  57  --   BILITOT 0.7  --   --  0.2*  --   TRIG  --   --   --   --  89   Estimated Creatinine Clearance: 42 ml/min (by C-G formula based on Cr of 0.6).    Recent Labs  08/15/13 2025 08/16/13 0040 08/16/13 0423  GLUCAP 150* 147* 145*      Insulin Requirements in the past 12 hours:  3 units SSI  Assessment: 63 YOF who developed bowel obstruction from a right-sided incarcerated hernia and vomited feculent material and aspirated.  Patient was taken to the OR on 08/13/13 for hernia repair.  She continues on TPN for prolonged ileus.  GI: hx diverticulosis - scheduled Zofran, PPI IV, TG WNL Endo: hx hypothyroidism on IV Synthroid.  No hx DM - CBGs acceptable  with minimal SSI use Lytes: Phos 1.1, K+ 3.1 (goal ~4 for ileus), others WNL - KCL x 4 runs, NaPhos 30 mmol IV x 1 ordered Renal: SCr 0.6, CrCL 42 ml/min, decent UOP, net positive 2.9L since admit, D5NS at 50 ml/hr Pulm: intubated 11/20 - FiO2 40% Cards: hx HLD / HTN / CHF (EF 55-60%) / pacemaker / CAD - VSS, AV-paced, CVP 9 (Norvasc, Lipitor, losartan PTA) Hepatobil: LFTs/tbili WNL Neuro: hx CVA / dementia / TIA - GCS 15, CPOT 0, RASS -1 (Aricept, Aggrenox PTA) ID: Vanc/Cefepime D#2 (11/20 >> ) for asp PNA, afebrile, WBC WNL, +SA PCR, all cx negative thus far Best Practices: Lovenox, MC, CHG/mupirocin, PPI IV TPN Access: right internal jugular triple lumen placed 11/20 TPN day#: 1 (11/20 >> )  Current Nutrition:  Clinimix E 5/15 at 40 ml/hr  Nutritional Goals:  1650-1800 kCal,  85-100 grams of protein per day   Plan:  - Increase Clinimix E 5/15 to 75 ml/hr + IVFE to 10 ml/hr, providing 1758 kCal and 90gm protein per day, meeting 100% of needs - Daily multivitamin and trace elements - Decrease IVF (D5NS) to [facility] when new TPN bag starts - KCL x 2 more runs (total of 6 runs today), may need to add to IVF if it continues to be below goal - F/U AM labs (noted MD ordered Phos at 2000 today)   Darcie Mellone D. Laney Potash, PharmD, BCPS Pager:  [phone] 08/16/2013, 10:32 AM

## 2013-08-16 NOTE — Progress Notes (Signed)
eLink Nursing ICU Electrolyte Replacement Protocol  Patient Name: [name] DOB: Dec 03, 1921 MRN: [id number]  Date of Service  08/16/2013   HPI/Events of Note    Recent Labs Lab 08/13/13 1155 08/14/13 0420 08/15/13 0438 08/16/13 0420  NA 132* 135 139 141  K 3.2* 3.3* 3.3* 3.1*  CL 88* 98 105 109  CO2 30 28 28 27   GLUCOSE 134* 126* 156* 149*  BUN 41* 32* 23 18  CREATININE 1.29* 0.94 0.83 0.60  CALCIUM 9.0 7.5* 7.2* 7.2*  MG  --   --   --  2.2  PHOS  --   --   --  1.1*    Estimated Creatinine Clearance: 42 ml/min (by C-G formula based on Cr of 0.6).  Intake/Output     11/20 0701 - 11/21 0700   I.V. (mL/kg) 1087.5 (16.7)   NG/GT 30   IV Piggyback 350   TPN 530.5   Total Intake(mL/kg) 1998 (30.7)   Urine (mL/kg/hr) 920 (0.6)   Drains 25 (0)   Total Output 945   Net +1053        - I/O DETAILED x24h    Total I/O In: 1010 [I.V.:500; NG/GT:30; TPN:480] Out: 430 [Urine:430] - I/O THIS SHIFT    ASSESSMENT   eICURN Interventions  K+ 3.1 Replaced using ICU protocol   ASSESSMENT: MAJOR ELECTROLYTE    Merita Norton 08/16/2013, 6:07 AM

## 2013-08-16 NOTE — Progress Notes (Signed)
MD aware of NG output drainage changing from light green to dark red/brown in color. CBC ordered. Will continue to monitor. Pt not complaining of pain and VS WNL.

## 2013-08-16 NOTE — Procedures (Signed)
Extubation Procedure Note  Patient Details:   Name: BALEIGH RENNAKER DOB: 03-Mar-1922 MRN: [id number]   Airway Documentation:     Evaluation  O2 sats: stable throughout Complications: No apparent complications Patient did tolerate procedure well. Bilateral Breath Sounds: Fine crackles;Clear Suctioning: Oral;Airway Yes  Pt tolerated weaning well, positive for cuff leak, extubated to 4lpm [place]. No stridor or dyspnea noted after extubation. Pt resting much more comfortably and all vitals are within normal limits at this time. RT will continue to monitor.   Beatris Si 08/16/2013, 9:52 AM

## 2013-08-17 LAB — GLUCOSE, CAPILLARY
Glucose-Capillary: 147 mg/dL — ABNORMAL HIGH (ref 70–99)
Glucose-Capillary: 157 mg/dL — ABNORMAL HIGH (ref 70–99)
Glucose-Capillary: 164 mg/dL — ABNORMAL HIGH (ref 70–99)
Glucose-Capillary: 178 mg/dL — ABNORMAL HIGH (ref 70–99)

## 2013-08-17 LAB — CULTURE, RESPIRATORY W GRAM STAIN: Culture: NO GROWTH

## 2013-08-17 LAB — CBC
Hemoglobin: 10.5 g/dL — ABNORMAL LOW (ref 12.0–15.0)
MCH: 32.4 pg (ref 26.0–34.0)
MCHC: 34.4 g/dL (ref 30.0–36.0)
MCV: 94.1 fL (ref 78.0–100.0)
RBC: 3.24 MIL/uL — ABNORMAL LOW (ref 3.87–5.11)
RDW: 13 % (ref 11.5–15.5)

## 2013-08-17 LAB — BASIC METABOLIC PANEL
BUN: 17 mg/dL (ref 6–23)
Creatinine, Ser: 0.52 mg/dL (ref 0.50–1.10)
GFR calc Af Amer: >90 mL/min (ref >90)
GFR calc non Af Amer: 83 mL/min — ABNORMAL LOW (ref >90)
Glucose, Bld: 257 mg/dL — ABNORMAL HIGH (ref 70–99)

## 2013-08-17 MED ORDER — INSULIN ASPART 100 UNIT/ML ~~LOC~~ SOLN
0.0000 [IU] | Freq: Four times a day (QID) | SUBCUTANEOUS | Status: DC
  Administered 2013-08-17: 3 [IU] via SUBCUTANEOUS
  Administered 2013-08-17: 2 [IU] via SUBCUTANEOUS
  Administered 2013-08-18 – 2013-08-19 (×7): 3 [IU] via SUBCUTANEOUS
  Administered 2013-08-19 – 2013-08-20 (×2): 2 [IU] via SUBCUTANEOUS

## 2013-08-17 MED ORDER — POTASSIUM PHOSPHATE DIBASIC 3 MMOLE/ML IV SOLN
10.0000 mmol | Freq: Once | INTRAVENOUS | Status: AC
  Administered 2013-08-17: 10 mmol via INTRAVENOUS
  Filled 2013-08-17: qty 3.33

## 2013-08-17 MED ORDER — POTASSIUM CHLORIDE 10 MEQ/50ML IV SOLN
10.0000 meq | INTRAVENOUS | Status: AC
  Administered 2013-08-17 (×6): 10 meq via INTRAVENOUS
  Filled 2013-08-17 (×5): qty 50

## 2013-08-17 MED ORDER — POTASSIUM CHLORIDE IN NACL 40-0.9 MEQ/L-% IV SOLN
INTRAVENOUS | Status: DC
  Administered 2013-08-17: 20 mL/h via INTRAVENOUS
  Administered 2013-08-19 – 2013-08-22 (×3): via INTRAVENOUS
  Filled 2013-08-17 (×6): qty 1000

## 2013-08-17 MED ORDER — SODIUM CHLORIDE 0.9 % IV SOLN
1.0000 g | Freq: Once | INTRAVENOUS | Status: AC
  Administered 2013-08-17: 1 g via INTRAVENOUS
  Filled 2013-08-17: qty 10

## 2013-08-17 MED ORDER — TRACE MINERALS CR-CU-F-FE-I-MN-MO-SE-ZN IV SOLN
INTRAVENOUS | Status: AC
  Administered 2013-08-17: 18:00:00 via INTRAVENOUS
  Filled 2013-08-17: qty 2000

## 2013-08-17 MED ORDER — FAT EMULSION 20 % IV EMUL
250.0000 mL | INTRAVENOUS | Status: AC
  Administered 2013-08-17: 250 mL via INTRAVENOUS
  Filled 2013-08-17: qty 250

## 2013-08-17 NOTE — Progress Notes (Signed)
PULMONARY  / CRITICAL CARE MEDICINE  Name: [name] MRN: [id number] DOB: Feb 09, 1922    ADMISSION DATE:  08/13/2013 CONSULTATION DATE:  08/15/2013  REFERRING MD :  Dr. Dwain Sarna PRIMARY SERVICE: CCS  CHIEF COMPLAINT:  Aspiration PNA and respiratory failure  BRIEF PATIENT DESCRIPTION: 79 year old with history of dementia and expressive aphasia who had a hip replacement in October then consequently developed bowel obstruction from a right sided incarcerated hernia who was vomiting feculent material and was noted to have aspirated, patient was taken to the OR on 11/18 for hernia repair.  On the morning of 11/20 patient was noted to be more lethargic and with tachypnea.  BP remained stable.  PCCM was called for further management.  Patient was noted to be very lethargic and airway protection was concerning.  Spoke to son who wanted everything done unless she is in a persistent vegetative state.  SIGNIFICANT EVENTS / STUDIES:  11/20 respiratory failure 11/20 CT chest > no PE, low lung volumes, bilateral pleural effusion, cardiomegaly,  11/20 CT head > remote MCA stroke, NAICP 11-21 periods of apparent central apnea during the night and required NIMVS with sats dropping to 60's. LINES / TUBES: ET tube 11/20>>>11/21 R IJ TLC 11/20>>> R radial a-line 11/20>>>11-22  CULTURES: Blood 11/20>>> Urine 11/20>>> Sputum 11/20>>>  ANTIBIOTICS: Cefepime 11/20>>> Vancomycin 11/20>>>11-22   SUBJECTIVE:  Wore BiPAP last pm 11/21  VITAL SIGNS: Temp:  [98.1 F (36.7 C)-99.5 F (37.5 C)] 99.5 F (37.5 C) (11/22 0400) Pulse Rate:  [56-78] 62 (11/22 0700) Resp:  [12-31] 24 (11/22 0700) BP: (135-154)/(48-76) 148/58 mmHg (11/22 0700) SpO2:  [94 %-100 %] 97 % (11/22 0700) Arterial Line BP: (158-174)/(64-73) 174/73 mmHg (11/21 1100) FiO2 (%):  [40 %] 40 % (11/21 0900) 2 l [place] HEMODYNAMICS: CVP:  [8 mmHg] 8 mmHg VENTILATOR SETTINGS:  Vent Mode:  [-] PSV FiO2 (%):  [40 %] 40 % PEEP:  [5  cmH20] 5 cmH20 Pressure Support:  [5 cmH20] 5 cmH20 INTAKE / OUTPUT: Intake/Output     11/21 0701 - 11/22 0700 11/22 0701 - 11/23 0700   I.V. (mL/kg) 803 (12.4)    NG/GT 90    IV Piggyback 450    TPN 1564    Total Intake(mL/kg) 2907 (44.7)    Urine (mL/kg/hr) 1545 (1)    Emesis/NG output 600 (0.4)    Drains 30 (0)    Total Output 2175     Net +732           PHYSICAL EXAMINATION: General:  Awake and alert HEENT: No LAN?JVD PULM: CTA B CV: RRR, no mgr AB: infrequent, soft Ext: trace edema Neuro: awake, follows commands, moves all four ext  LABS:  CBC  Recent Labs Lab 08/16/13 0420 08/16/13 2000 08/17/13 0422  WBC 6.4 7.1 7.2  HGB 10.4* 10.1* 10.5*  HCT 31.7* 30.1* 30.5*  PLT 238 242 230   Coag's No results found for this basename: APTT, INR,  in the last 168 hours BMET  Recent Labs Lab 08/15/13 0438 08/16/13 0420 08/17/13 0422  NA 139 141 138  K 3.3* 3.1* 3.0*  CL 105 109 104  CO2 28 27 27   BUN 23 18 17   CREATININE 0.83 0.60 0.52  GLUCOSE 156* 149* 257*   Electrolytes  Recent Labs Lab 08/15/13 0438 08/16/13 0420 08/16/13 2000 08/17/13 0422  CALCIUM 7.2* 7.2*  --  7.0*  MG  --  2.2  --   --   PHOS  --  1.1*  2.1*  --    Sepsis Markers  Recent Labs Lab 08/13/13 1330  LATICACIDVEN 1.1   ABG  Recent Labs Lab 08/15/13 0614 08/15/13 1134 08/16/13 0424  PHART 7.438 7.430 7.413  PCO2ART 38.2 39.6 39.6  PO2ART 91.0 87.0 79.0*   Liver Enzymes  Recent Labs Lab 08/13/13 1155 08/16/13 0420  AST 52* 15  ALT 32 13  ALKPHOS 88 57  BILITOT 0.7 0.2*  ALBUMIN 3.4* 2.0*   Cardiac Enzymes No results found for this basename: TROPONINI, PROBNP,  in the last 168 hours Glucose  Recent Labs Lab 08/16/13 0423 08/16/13 0905 08/16/13 1144 08/16/13 1523 08/17/13 0031 08/17/13 0540  GLUCAP 145* 140* 125* 136* 157* 164*    Imaging  CXR:No cxr 11-22  ASSESSMENT / PLAN:  PULMONARY A: VDRF due to aspiration and evolving aspiration PNA  > resolving 11/20 CT chest P:   - Extubateed. - Incentive spiro - aspiration precautions - Nocturnal bipap, note observation of probable central apneas  CARDIOVASCULAR A: Sepsis and hypotension > resolved P:  - gentle IVF with D5  RENAL A:  Renal function stable   P:   - Replace electrolytes as needed. - BMET as needed - replete K  GASTROINTESTINAL A:  S/P hernia repair. P:   - NPO w NGT in place - TPN. - Further recommendations per surgery.  HEMATOLOGIC  Recent Labs  08/16/13 2000 08/17/13 0422  HGB 10.1* 10.5*    A:  Stable, no active issues. P:  - Monitor CBC. - Transfusion threshold is 7.  INFECTIOUS A:  Aspiration PNA. P:   - Cont cefepime, vanco d/c'd 11/22 - F/U culture.  ENDOCRINE A:  No diabetes history.   P:   - Monitor CBGs as needed.  NEUROLOGIC  A:  Acute encephalopathy resolving   P:   - F/U head CT. Note results 11-20 NAD - PRN sedation protocol.  TODAY'S SUMMARY: extubated 11-21, periods of apnea early am required bipap.  Brett Canales Minor ACNP Adolph Pollack PCCM Pager [phone] till 3 pm If no answer page [phone] 08/17/2013, 8:02 AM  Attending Note:  Agree with the data, plans above. She had apparent apneas and associated hypoxemia overnight, tolerated BiPAP.  Continue cefepime and stop vanco in absence any resp cx data.   Levy Pupa, MD, PhD 08/17/2013, 1:03 PM [facility] [phone] or if no answer [phone]

## 2013-08-17 NOTE — Progress Notes (Signed)
eLink Physician-Brief Progress Note Patient Name: [name] DOB: 10/23/1921 MRN: [id number]  Date of Service  08/17/2013   HPI/Events of Note  Call from nurse reporting cyclic periods of apnea and drops in sats to the 60s.  Concern for sleep apnea.  Has no h/o of OSA or use of CPAP or BiPAP.   eICU Interventions  Plan: Attempt to use CPAP. Continue to monitor   Intervention Category Intermediate Interventions: Respiratory distress - evaluation and management  DETERDING,ELIZABETH 08/17/2013, 2:43 AM

## 2013-08-17 NOTE — Progress Notes (Signed)
Patient looks very good.  If she tolerated NGT clamping for 8 hours will DC tube.  Marta Lamas. Gae Bon, MD, FACS [phone] [facility] Surgery

## 2013-08-17 NOTE — Progress Notes (Signed)
Patient ID: [name], female   DOB: 10/15/1921, 79 y.o.   MRN: [id number]   Subjective: Pt looks much better today.  Extubated.  Alert, awake and talking.  No flatus.    Objective:  Vital signs:  Filed Vitals:   08/17/13 0600 08/17/13 0700 08/17/13 0800 08/17/13 0820  BP: 154/60 148/58 150/60   Pulse: 61 62 62   Temp:    98 F (36.7 C)  TempSrc:    Oral  Resp: 27 24 32   Height:      Weight:      SpO2: 99% 97% 97%        Intake/Output   Yesterday:  11/21 0701 - 11/22 0700 In: 2907 [I.V.:803; NG/GT:90; IV Piggyback:450; TPN:1564] Out: 2175 [Urine:1545; Emesis/NG output:600; Drains:30] This shift:     Bowel function:  Flatus: none  BM: none  Drain: RLQ drain 4ml/24h serosanguinous output.    NGT 646ml/24h bilious output  Physical Exam:  General: alert, awake and oriented.  NAD.   Chest: CTA bilaterally.   CV: Pulses intact. Regular rhythm  Abdomen:bowel sounds are present.  RLQ incision is clean, without drainage, staples are intact.  RLQ drain with serosanguinous output.  NGT in place. Ext:  SCDs BLE.  No mjr edema.  No cyanosis Skin: No petechiae / purpura   Problem List:   Principal Problem:   Incarcerated femoral hernia Active Problems:   HYPOTHYROIDISM   HYPERLIPIDEMIA   DEMENTIA   HYPERTENSION   AV BLOCK, COMPLETE   PACEMAKER, PERMANENT   Acute respiratory failure   Aspiration pneumonia   Sepsis   Altered mental status    Results:   Labs: Results for orders placed during the hospital encounter of 08/13/13 (from the past 48 hour(s))  POCT I-STAT 3, BLOOD GAS (G3+)     Status: Abnormal   Collection Time    08/15/13 11:34 AM      Result Value Range   pH, Arterial 7.430  7.350 - 7.450   pCO2 arterial 39.6  35.0 - 45.0 mmHg   pO2, Arterial 87.0  80.0 - 100.0 mmHg   Bicarbonate 26.3 (*) 20.0 - 24.0 mEq/L   TCO2 28  0 - 100 mmol/L   O2 Saturation 97.0     Acid-Base Excess 2.0  0.0 - 2.0 mmol/L   Patient temperature 98.4 F      Collection site ARTERIAL LINE     Drawn by Operator     Sample type ARTERIAL    CULTURE, RESPIRATORY (NON-EXPECTORATED)     Status: None   Collection Time    08/15/13 11:40 AM      Result Value Range   Specimen Description TRACHEAL ASPIRATE     Special Requests NONE     Gram Stain       Value: FEW WBC PRESENT,BOTH PMN AND MONONUCLEAR     RARE SQUAMOUS EPITHELIAL CELLS PRESENT     NO ORGANISMS SEEN     Performed at [facility]   Culture       Value: NO GROWTH 1 DAY     Performed at [facility]   Report Status PENDING    CULTURE, BLOOD (ROUTINE X 2)     Status: None   Collection Time    08/15/13  1:05 PM      Result Value Range   Specimen Description BLOOD LEFT HAND     Special Requests BOTTLES DRAWN AEROBIC ONLY 8CC     Culture  Setup Time  Value: 08/15/2013 17:30     Performed at [facility]   Culture       Value:        BLOOD CULTURE RECEIVED NO GROWTH TO DATE CULTURE WILL BE HELD FOR 5 DAYS BEFORE ISSUING A FINAL NEGATIVE REPORT     Performed at [facility]   Report Status PENDING    CULTURE, BLOOD (ROUTINE X 2)     Status: None   Collection Time    08/15/13  1:15 PM      Result Value Range   Specimen Description BLOOD RIGHT ARM     Special Requests BOTTLES DRAWN AEROBIC AND ANAEROBIC 10CC     Culture  Setup Time       Value: 08/15/2013 17:30     Performed at [facility]   Culture       Value:        BLOOD CULTURE RECEIVED NO GROWTH TO DATE CULTURE WILL BE HELD FOR 5 DAYS BEFORE ISSUING A FINAL NEGATIVE REPORT     Performed at [facility]   Report Status PENDING    GLUCOSE, CAPILLARY     Status: Abnormal   Collection Time    08/15/13  1:31 PM      Result Value Range   Glucose-Capillary 149 (*) 70 - 99 mg/dL  URINE CULTURE     Status: None   Collection Time    08/15/13  1:35 PM      Result Value Range   Specimen Description URINE, RANDOM     Special Requests NONE     Culture  Setup Time       Value:  08/15/2013 21:01     Performed at [facility] Count       Value: NO GROWTH     Performed at [facility]   Culture       Value: NO GROWTH     Performed at [facility]   Report Status 08/16/2013 FINAL    URINALYSIS, ROUTINE W REFLEX MICROSCOPIC     Status: Abnormal   Collection Time    08/15/13  1:35 PM      Result Value Range   Color, Urine YELLOW  YELLOW   APPearance CLEAR  CLEAR   Specific Gravity, Urine 1.010  1.005 - 1.030   pH 6.5  5.0 - 8.0   Glucose, UA 100 (*) NEGATIVE mg/dL   Hgb urine dipstick NEGATIVE  NEGATIVE   Bilirubin Urine NEGATIVE  NEGATIVE   Ketones, ur NEGATIVE  NEGATIVE mg/dL   Protein, ur 30 (*) NEGATIVE mg/dL   Urobilinogen, UA 0.2  0.0 - 1.0 mg/dL   Nitrite NEGATIVE  NEGATIVE   Leukocytes, UA NEGATIVE  NEGATIVE  URINE MICROSCOPIC-ADD ON     Status: None   Collection Time    08/15/13  1:35 PM      Result Value Range   Squamous Epithelial / LPF RARE  RARE   WBC, UA 0-2  <3 WBC/hpf   RBC / HPF 0-2  <3 RBC/hpf   Bacteria, UA RARE  RARE  GLUCOSE, CAPILLARY     Status: Abnormal   Collection Time    08/15/13  5:35 PM      Result Value Range   Glucose-Capillary 124 (*) 70 - 99 mg/dL  GLUCOSE, CAPILLARY     Status: Abnormal   Collection Time    08/15/13  8:25 PM      Result Value  Range   Glucose-Capillary 150 (*) 70 - 99 mg/dL  GLUCOSE, CAPILLARY     Status: Abnormal   Collection Time    08/16/13 12:40 AM      Result Value Range   Glucose-Capillary 147 (*) 70 - 99 mg/dL  CBC     Status: Abnormal   Collection Time    08/16/13  4:20 AM      Result Value Range   WBC 6.4  4.0 - 10.5 K/uL   RBC 3.32 (*) 3.87 - 5.11 MIL/uL   Hemoglobin 10.4 (*) 12.0 - 15.0 g/dL   HCT 16.1 (*) 09.6 - 04.5 %   MCV 95.5  78.0 - 100.0 fL   MCH 31.3  26.0 - 34.0 pg   MCHC 32.8  30.0 - 36.0 g/dL   RDW 40.9  81.1 - 91.4 %   Platelets 238  150 - 400 K/uL  MAGNESIUM     Status: None   Collection Time    08/16/13  4:20 AM      Result  Value Range   Magnesium 2.2  1.5 - 2.5 mg/dL  PHOSPHORUS     Status: Abnormal   Collection Time    08/16/13  4:20 AM      Result Value Range   Phosphorus 1.1 (*) 2.3 - 4.6 mg/dL  COMPREHENSIVE METABOLIC PANEL     Status: Abnormal   Collection Time    08/16/13  4:20 AM      Result Value Range   Sodium 141  135 - 145 mEq/L   Potassium 3.1 (*) 3.5 - 5.1 mEq/L   Chloride 109  96 - 112 mEq/L   CO2 27  19 - 32 mEq/L   Glucose, Bld 149 (*) 70 - 99 mg/dL   BUN 18  6 - 23 mg/dL   Creatinine, Ser 7.82  0.50 - 1.10 mg/dL   Calcium 7.2 (*) 8.4 - 10.5 mg/dL   Total Protein 4.7 (*) 6.0 - 8.3 g/dL   Albumin 2.0 (*) 3.5 - 5.2 g/dL   AST 15  0 - 37 U/L   ALT 13  0 - 35 U/L   Alkaline Phosphatase 57  39 - 117 U/L   Total Bilirubin 0.2 (*) 0.3 - 1.2 mg/dL   GFR calc non Af Amer 79 (*) >90 mL/min   GFR calc Af Amer >90  >90 mL/min   Comment: (NOTE)     The eGFR has been calculated using the CKD EPI equation.     This calculation has not been validated in all clinical situations.     eGFR's persistently <90 mL/min signify possible Chronic Kidney     Disease.  PREALBUMIN     Status: Abnormal   Collection Time    08/16/13  4:20 AM      Result Value Range   Prealbumin 7.8 (*) 17.0 - 34.0 mg/dL   Comment: Performed at [facility]  DIFFERENTIAL     Status: Abnormal   Collection Time    08/16/13  4:20 AM      Result Value Range   Neutrophils Relative % 67  43 - 77 %   Neutro Abs 4.3  1.7 - 7.7 K/uL   Lymphocytes Relative 15  12 - 46 %   Lymphs Abs 1.0  0.7 - 4.0 K/uL   Monocytes Relative 12  3 - 12 %   Monocytes Absolute 0.8  0.1 - 1.0 K/uL   Eosinophils Relative 6 (*) 0 - 5 %  Eosinophils Absolute 0.4  0.0 - 0.7 K/uL   Basophils Relative 0  0 - 1 %   Basophils Absolute 0.0  0.0 - 0.1 K/uL  GLUCOSE, CAPILLARY     Status: Abnormal   Collection Time    08/16/13  4:23 AM      Result Value Range   Glucose-Capillary 145 (*) 70 - 99 mg/dL  POCT I-STAT 3, BLOOD GAS (G3+)     Status:  Abnormal   Collection Time    08/16/13  4:24 AM      Result Value Range   pH, Arterial 7.413  7.350 - 7.450   pCO2 arterial 39.6  35.0 - 45.0 mmHg   pO2, Arterial 79.0 (*) 80.0 - 100.0 mmHg   Bicarbonate 25.3 (*) 20.0 - 24.0 mEq/L   TCO2 27  0 - 100 mmol/L   O2 Saturation 96.0     Acid-Base Excess 1.0  0.0 - 2.0 mmol/L   Patient temperature 98.3 F     Sample type ARTERIAL    TRIGLYCERIDES     Status: None   Collection Time    08/16/13  4:30 AM      Result Value Range   Triglycerides 89  <150 mg/dL  GLUCOSE, CAPILLARY     Status: Abnormal   Collection Time    08/16/13  9:05 AM      Result Value Range   Glucose-Capillary 140 (*) 70 - 99 mg/dL  GLUCOSE, CAPILLARY     Status: Abnormal   Collection Time    08/16/13 11:44 AM      Result Value Range   Glucose-Capillary 125 (*) 70 - 99 mg/dL  GLUCOSE, CAPILLARY     Status: Abnormal   Collection Time    08/16/13  3:23 PM      Result Value Range   Glucose-Capillary 136 (*) 70 - 99 mg/dL  PHOSPHORUS     Status: Abnormal   Collection Time    08/16/13  8:00 PM      Result Value Range   Phosphorus 2.1 (*) 2.3 - 4.6 mg/dL  CBC     Status: Abnormal   Collection Time    08/16/13  8:00 PM      Result Value Range   WBC 7.1  4.0 - 10.5 K/uL   RBC 3.19 (*) 3.87 - 5.11 MIL/uL   Hemoglobin 10.1 (*) 12.0 - 15.0 g/dL   HCT 04.5 (*) 40.9 - 81.1 %   MCV 94.4  78.0 - 100.0 fL   MCH 31.7  26.0 - 34.0 pg   MCHC 33.6  30.0 - 36.0 g/dL   RDW 91.4  78.2 - 95.6 %   Platelets 242  150 - 400 K/uL  GLUCOSE, CAPILLARY     Status: Abnormal   Collection Time    08/17/13 12:31 AM      Result Value Range   Glucose-Capillary 157 (*) 70 - 99 mg/dL   Comment 1 Documented in Chart     Comment 2 Notify RN    BASIC METABOLIC PANEL     Status: Abnormal   Collection Time    08/17/13  4:22 AM      Result Value Range   Sodium 138  135 - 145 mEq/L   Potassium 3.0 (*) 3.5 - 5.1 mEq/L   Chloride 104  96 - 112 mEq/L   CO2 27  19 - 32 mEq/L   Glucose, Bld  257 (*) 70 - 99 mg/dL   BUN 17  6 -  23 mg/dL   Creatinine, Ser 1.47  0.50 - 1.10 mg/dL   Calcium 7.0 (*) 8.4 - 10.5 mg/dL   GFR calc non Af Amer 83 (*) >90 mL/min   GFR calc Af Amer >90  >90 mL/min   Comment: (NOTE)     The eGFR has been calculated using the CKD EPI equation.     This calculation has not been validated in all clinical situations.     eGFR's persistently <90 mL/min signify possible Chronic Kidney     Disease.  CBC     Status: Abnormal   Collection Time    08/17/13  4:22 AM      Result Value Range   WBC 7.2  4.0 - 10.5 K/uL   RBC 3.24 (*) 3.87 - 5.11 MIL/uL   Hemoglobin 10.5 (*) 12.0 - 15.0 g/dL   HCT 82.9 (*) 56.2 - 13.0 %   MCV 94.1  78.0 - 100.0 fL   MCH 32.4  26.0 - 34.0 pg   MCHC 34.4  30.0 - 36.0 g/dL   RDW 86.5  78.4 - 69.6 %   Platelets 230  150 - 400 K/uL  GLUCOSE, CAPILLARY     Status: Abnormal   Collection Time    08/17/13  5:40 AM      Result Value Range   Glucose-Capillary 164 (*) 70 - 99 mg/dL   Comment 1 Documented in Chart     Comment 2 Notify RN      Imaging / Studies: Ct Head Wo Contrast  08/15/2013   CLINICAL DATA:  Depressed mental status.  History of stroke.  EXAM: CT HEAD WITHOUT CONTRAST  TECHNIQUE: Contiguous axial images were obtained from the base of the skull through the vertex without contrast.  COMPARISON:  07/06/2011.  FINDINGS: Atrophy with chronic microvascular ischemic change. Previous left MCA aneurysm clipping with associated left frontal, temporal, and parietal cerebral infarction. Small left basal ganglia lacune. Slight depression frontotemporal craniotomy.  No acute stroke or hemorrhage. No mass lesion, hydrocephalus, or visible extra-axial fluid. Advanced vascular calcification. No sinus or mastoid disease. Previous ocular surgery.  Compared with priors, there has been slight progression of atrophy and chronic microvascular ischemic change.  IMPRESSION: Chronic changes as described. Slight progression of atrophy with chronic  microvascular ischemic change. Remote left MCA territory infarction. No acute hemorrhage or visible acute cerebral infarction.   Electronically Signed   By: Davonna Belling M.D.   On: 08/15/2013 11:12   Ct Angio Chest Pe W/cm &/or Wo Cm  08/15/2013   CLINICAL DATA:  History of left total hip arthroplasty an incarcerated right inguinal hernia repair now presenting with shortness of breath.  EXAM: CT ANGIOGRAPHY CHEST WITH CONTRAST  TECHNIQUE: Multidetector CT imaging of the chest was performed using the standard protocol during bolus administration of intravenous contrast. Multiplanar CT image reconstructions including MIPs were obtained to evaluate the vascular anatomy.  CONTRAST:  150 mm of Omnipaque 350.  COMPARISON:  No priors.  FINDINGS: Comment: This is a limited examination. There was a malfunction of the scan after the initial injection of contrast material such that this scanner did not trigger image acquisition. A 2nd smaller injection of half dose contrast was administered, and images were acquired, however, systemic arterial phase images were acquired (rather than pulmonary arterial phase), limiting assessment of the pulmonary arterial tree.  Mediastinum: Attenuation of the blood pole within the main pulmonary arteries is approximately 156 HU, severely limiting assessment for pulmonary embolism. With these limitations in  mind, no large saddle embolus or definite lobar embolus is identified. Segmental and subsegmental sized emboli cannot be excluded on today's examination. Heart size is mildly enlarged. There is no significant pericardial fluid, thickening or pericardial calcification. There is atherosclerosis of the thoracic aorta, the great vessels of the mediastinum and the coronary arteries, including calcified atherosclerotic plaque in the left main, left anterior descending, left circumflex and right coronary arteries. Extensive mitral annular calcifications. Mild aortic valve calcifications.  Left-sided pacemaker device with lead tips terminating in the right atrial appendage and right ventricular apex. No pathologically enlarged mediastinal or hilar lymph nodes. Esophagus is unremarkable in appearance. Nasogastric tube extends into the stomach, but the tip extends below the lower margin of the images. Right internal jugular central venous catheter with tip terminating at the superior cavoatrial junction. The patient is intubated, with the tip of the endotracheal tube lying approximately 2.1 cm above the carina.  Lungs/Pleura: Small bilateral pleural effusions are simple in appearance and layer dependently. Extensive passive atelectasis is noted throughout the lower lobes of the lungs bilaterally. No definite consolidative airspace disease. Focal areas of architectural distortion are noted in the inferior segment of the lingula and medial segment of the right middle lobe, and there is some associated mild cylindrical bronchiectasis in the medial segment of the right middle lobe; these findings are most compatible with areas chronic scarring.  Upper Abdomen: 11 mm simple cyst in the upper pole of the left kidney. Mild intrahepatic biliary ductal dilatation appears unchanged compared to the prior study and is likely chronic in this patient.  Musculoskeletal: There are no aggressive appearing lytic or blastic lesions noted in the visualized portions of the skeleton.  Review of the MIP images confirms the above findings.  IMPRESSION: 1. Exceedingly limited examination due to suboptimal contrast bolus timing. While there is no central saddle embolus or lobar embolus on today's study, segmental and subsegmental sized emboli cannot be entirely excluded. These findings and limitations were discussed by phone with Dr. Molli Knock by Dr. Llana Aliment on 08/15/2013 at 11:15 a.m. 2. Low lung volumes with extensive passive atelectasis throughout the lower lobes of lungs bilaterally. 3. Small bilateral pleural effusions are  simple in appearance and layer dependently. 4. Mild cardiomegaly. 5. Atherosclerosis, including left main and 3 vessel coronary artery disease. 6. Support apparatus and additional incidental findings, as above.   Electronically Signed   By: Trudie Reed M.D.   On: 08/15/2013 11:32   Dg Chest Port 1 View  08/16/2013   CLINICAL DATA:  Endotracheal tube position.  EXAM: PORTABLE CHEST - 1 VIEW  COMPARISON:  08/15/2013 CT and chest x-ray.  FINDINGS: Endotracheal tube tip 2.8 cm above the carina.  Right central line tip mid to distal superior vena cava level. Evaluation limited by overlying structures.  Sequential pacemaker enters from the left.  No gross pneumothorax.  Consolidation lung bases consistent with atelectasis/ infiltrates with small pleural effusions. Please see recent CT report.  Pulmonary vascular congestion most notable centrally.  Cardiomegaly.  Calcified tortuous aorta.  IMPRESSION: Endotracheal tube tip 2.8 cm above the carina.  Please see above.   Electronically Signed   By: Bridgett Larsson M.D.   On: 08/16/2013 07:40   Dg Chest Portable 1 View  08/15/2013   CLINICAL DATA:  ET tube and central line placement.  EXAM: PORTABLE CHEST - 1 VIEW  COMPARISON:  Portable chest earlier in the day.  FINDINGS: Transvenous pacer with atrial and ventricular leads good position. ET tube 38  mm above carina. Central venous catheter tip is overlapped by telemetry leads, pacer wires, and nasogastric tube but appears to lie at at or near the cavoatrial junction. There is no pneumothorax. The heart is enlarged and there persists bibasilar opacities left greater than right. Nasogastric tube is coiled in the stomach.  IMPRESSION: Satisfactory ETT placement. Central venous catheter tip likely lies at the cavoatrial junction. Attention to location on follow-up. No pneumothorax. Persistent bibasilar opacities left greater than right.   Electronically Signed   By: Davonna Belling M.D.   On: 08/15/2013 10:10     Medications / Allergies: per chart  Antibiotics: Anti-infectives   Start     Dose/Rate Route Frequency Ordered Stop   08/15/13 1000  vancomycin (VANCOCIN) IVPB 1000 mg/200 mL premix  Status:  Discontinued     1,000 mg 200 mL/hr over 60 Minutes Intravenous Every 24 hours 08/15/13 0853 08/17/13 0809   08/15/13 1000  ceFEPIme (MAXIPIME) 1 g in dextrose 5 % 50 mL IVPB     1 g 100 mL/hr over 30 Minutes Intravenous Every 24 hours 08/15/13 0856     08/14/13 0031  piperacillin-tazobactam (ZOSYN) IVPB 3.375 g  Status:  Discontinued     3.375 g 12.5 mL/hr over 240 Minutes Intravenous 3 times per day 08/14/13 0031 08/15/13 0739   08/13/13 1715  piperacillin-tazobactam (ZOSYN) IVPB 3.375 g  Status:  Discontinued     3.375 g 12.5 mL/hr over 240 Minutes Intravenous 3 times per day 08/13/13 1700 08/15/13 0852     Assessment/Plan  Hx CVA  Hypothyroidism  CAD, CHF, AVB s/p pacemaker  Hypertension  Dementia, mild  Hypokalemia  Aspiration PNA  Sepsis  VDRF  High grade small bowel obstruction secondary to femoral hernia inguinal hernia  -s/p primary repair of right femoral hernia with JP drain placement Dr. Derrell Lolling 08/13/13  -POD #4  -Appreciate CCM assistance  -Continue NPO, TPN, clamp NGT  -Continue drain  -Routine dressing care  -Zosyn changed to cefepime and Vanc day #3, needs a total of 7 days of atbx for abdominal surgery.  PNA treatment duration per CCM.  Ashok Norris, [facility] Surgery Pager [phone] Office [phone]  08/17/2013  8:33 AM

## 2013-08-17 NOTE — Progress Notes (Signed)
[facility] ADULT ICU REPLACEMENT PROTOCOL FOR AM LAB REPLACEMENT ONLY  The patient does not apply for the [facility] Adult ICU Electrolyte Replacment Protocol based on the criteria listed below:    2. Is urine output >/= 0.5 ml/kg/hr for the last 6 hours? no Patient's UOP is 0.4 ml/kg/hr  4. Abnormal electrolyte(s): K+ 5. Ordered repletion with: MD. If a panic level lab has been reported, has the CCM MD in charge been notified? yes.   Physician:  Wyline Mood [facility] 08/17/2013 5:39 AM

## 2013-08-17 NOTE — Progress Notes (Signed)
PARENTERAL NUTRITION CONSULT NOTE - FOLLOW UP  Pharmacy Consult:  TPN Indication:  Prolonged ileus  Allergies  Allergen Reactions  . Codeine     unknown    Patient Measurements: Height: 5\' 4"  (162.6 cm) Weight: 143 lb 4.8 oz (65 kg) IBW/kg (Calculated) : 54.7  Vital Signs: Temp: 99.5 F (37.5 C) (11/22 0400) Temp src: Oral (11/22 0400) BP: 154/60 mmHg (11/22 0600) Pulse Rate: 61 (11/22 0600) Intake/Output from previous day: 11/21 0701 - 11/22 0700 In: 2887 [I.V.:783; NG/GT:90; IV Piggyback:450; TPN:1564] Out: 2175 [Urine:1545; Emesis/NG output:600; Drains:30]  Labs:  Recent Labs  08/16/13 0420 08/16/13 2000 08/17/13 0422  WBC 6.4 7.1 7.2  HGB 10.4* 10.1* 10.5*  HCT 31.7* 30.1* 30.5*  PLT 238 242 230     Recent Labs  08/15/13 0438 08/16/13 0420 08/16/13 0430 08/16/13 2000 08/17/13 0422  NA 139 141  --   --  138  K 3.3* 3.1*  --   --  3.0*  CL 105 109  --   --  104  CO2 28 27  --   --  27  GLUCOSE 156* 149*  --   --  257*  BUN 23 18  --   --  17  CREATININE 0.83 0.60  --   --  0.52  CALCIUM 7.2* 7.2*  --   --  7.0*  MG  --  2.2  --   --   --   PHOS  --  1.1*  --  2.1*  --   PROT  --  4.7*  --   --   --   ALBUMIN  --  2.0*  --   --   --   AST  --  15  --   --   --   ALT  --  13  --   --   --   ALKPHOS  --  57  --   --   --   BILITOT  --  0.2*  --   --   --   PREALBUMIN  --  7.8*  --   --   --   TRIG  --   --  89  --   --    Estimated Creatinine Clearance: 42 ml/min (by C-G formula based on Cr of 0.52).    Recent Labs  08/16/13 1523 08/17/13 0031 08/17/13 0540  GLUCAP 136* 157* 164*      Insulin Requirements in the past 24 hours:  7 units SSI  Assessment: 65 YOF who developed bowel obstruction from a right-sided incarcerated hernia and vomited feculent material and aspirated.  Patient was taken to the OR on 08/13/13 for hernia repair.  She continues on TPN for prolonged ileus.  GI: hx diverticulosis - scheduled Zofran, PPI IV, TG WNL,  NG O/P and drainage becoming dark red/brown.  Prealbumin low at 7.8. Endo: hx hypothyroidism on IV Synthroid.  No hx DM - CBGs trended up with increased TPN provision, question accuracy of serum glucose at 257 Lytes: Phos 2.1 last PM and has not been supplemented, K+ 3 (goal ~4 for ileus) and 6 runs KCL ordered, calcium at low end of normal and trending down (8.6) Renal: SCr 0.52 (stable), CrCL 42 ml/min, UOP improving, net positive 3.6L since admit, D5NS at [facility] Pulm: extubated, 2L [place] Cards: hx HLD / HTN / CHF (EF 55-60%) / pacemaker / CAD - VSS, AV-paced, CVP 8 (Norvasc, Lipitor, losartan PTA) Hepatobil: LFTs/tbili WNL Neuro: hx CVA /  dementia / TIA - GCS 15 (Aricept, Aggrenox PTA) ID: Vanc/Cefepime D#3 (11/20 >> ) for asp PNA, afebrile, WBC WNL, +SA PCR, all cx negative thus far Best Practices: Lovenox, MC, CHG/mupirocin, PPI IV TPN Access: right internal jugular triple lumen placed 11/20 TPN day#: 2 (11/20 >> )  Current Nutrition:  Clinimix E 5/15 at 75 ml/hr + lipids at 10 ml/hr  Nutritional Goals:  1650-1800 kCal, 85-100 grams of protein per day   Plan:  - Continue Clinimix E 5/15 at 75 ml/hr + IVFE at 10 ml/hr, providing 1758 kCal and 90gm protein per day, meeting 100% of needs - Daily multivitamin and trace elements - Change IVF to NS and add KCL (maintain rate at [facility], providing KCL per day) - Calcium gluconate 1gm IV x 1 - KPhos 10 mmol IV x 1 (provides additional KCL) - Change SSI to moderate - F/U nutritional plan post extubation - Consider holding Lovenox if patient is bleeding - F/U AM labs    Alyssa Mancera D. Laney Potash, PharmD, BCPS Pager:  [phone] 08/17/2013, 7:37 AM

## 2013-08-17 NOTE — Progress Notes (Signed)
Clinical Child psychotherapist (CSW) contacted weekend [facility] who confirmed that patient is going to [employer]. Please reconsult if further social work needs arise. CSW signing off.   Jetta Lout, LCSWA Weekend CSW [phone]

## 2013-08-17 NOTE — Progress Notes (Signed)
NG tube clamped per order. Pt. Reminded to let staff know if she develops nausea.She verbalized understanding.

## 2013-08-17 NOTE — Progress Notes (Signed)
eLink Physician-Brief Progress Note Patient Name: [name] DOB: [birth year] MRN: [id number]  Date of Service  08/17/2013   HPI/Events of Note  Hypokalemia   eICU Interventions  Potassium replaced   Intervention Category Intermediate Interventions: Electrolyte abnormality - evaluation and management  DETERDING,ELIZABETH 08/17/2013, 5:40 AM

## 2013-08-18 ENCOUNTER — Inpatient Hospital Stay (HOSPITAL_COMMUNITY): Payer: Medicare Other

## 2013-08-18 DIAGNOSIS — A419 Sepsis, unspecified organism: Secondary | ICD-10-CM

## 2013-08-18 LAB — GLUCOSE, CAPILLARY
Glucose-Capillary: 161 mg/dL — ABNORMAL HIGH (ref 70–99)
Glucose-Capillary: 157 mg/dL — ABNORMAL HIGH (ref 70–99)

## 2013-08-18 LAB — BASIC METABOLIC PANEL
BUN: 19 mg/dL (ref 6–23)
CO2: 25 mEq/L (ref 19–32)
Chloride: 100 mEq/L (ref 96–112)
GFR calc Af Amer: >90 mL/min (ref >90)
GFR calc non Af Amer: 82 mL/min — ABNORMAL LOW (ref >90)
Potassium: 3.9 mEq/L (ref 3.5–5.1)
Sodium: 132 mEq/L — ABNORMAL LOW (ref 135–145)

## 2013-08-18 LAB — MAGNESIUM: Magnesium: 1.9 mg/dL (ref 1.5–2.5)

## 2013-08-18 LAB — CBC
Hemoglobin: 11.2 g/dL — ABNORMAL LOW (ref 12.0–15.0)
MCH: 31.4 pg (ref 26.0–34.0)
Platelets: 276 10*3/uL (ref 150–400)
RBC: 3.57 MIL/uL — ABNORMAL LOW (ref 3.87–5.11)
WBC: 9.8 10*3/uL (ref 4.0–10.5)

## 2013-08-18 MED ORDER — TRACE MINERALS CR-CU-F-FE-I-MN-MO-SE-ZN IV SOLN
INTRAVENOUS | Status: AC
  Administered 2013-08-18: 18:00:00 via INTRAVENOUS
  Filled 2013-08-18: qty 2000

## 2013-08-18 MED ORDER — ENOXAPARIN SODIUM 40 MG/0.4ML ~~LOC~~ SOLN
40.0000 mg | SUBCUTANEOUS | Status: DC
  Administered 2013-08-18 – 2013-08-23 (×6): 40 mg via SUBCUTANEOUS
  Filled 2013-08-18 (×6): qty 0.4

## 2013-08-18 MED ORDER — FAT EMULSION 20 % IV EMUL
250.0000 mL | INTRAVENOUS | Status: AC
  Administered 2013-08-18: 250 mL via INTRAVENOUS
  Filled 2013-08-18: qty 250

## 2013-08-18 NOTE — Progress Notes (Signed)
Tolerated ng clamp. Ng out. Some flatus.   Alert, nad Soft, nt, nd. Drain - empty  Clears as tolerated Pt/ot Remove JP Cont TPN for now until ileus completely resolved.   [name]. Andrey Campanile, MD, FACS General, Bariatric, & Minimally Invasive Surgery [facility], [place]

## 2013-08-18 NOTE — Progress Notes (Signed)
PULMONARY  / CRITICAL CARE MEDICINE  Name: [name] MRN: [id number] DOB: 1922-04-04    ADMISSION DATE:  08/13/2013 CONSULTATION DATE:  08/15/2013  REFERRING MD :  Dr. Dwain Sarna PRIMARY SERVICE: CCS  CHIEF COMPLAINT:  Aspiration PNA and respiratory failure  BRIEF PATIENT DESCRIPTION: 79 year old with history of dementia and expressive aphasia who had a hip replacement in October then consequently developed bowel obstruction from a right sided incarcerated hernia who was vomiting feculent material and was noted to have aspirated, patient was taken to the OR on 11/18 for hernia repair.  On the morning of 11/20 patient was noted to be more lethargic and with tachypnea.  BP remained stable.  PCCM was called for further management.  Patient was noted to be very lethargic and airway protection was concerning.  Spoke to son who wanted everything done unless she is in a persistent vegetative state.  SIGNIFICANT EVENTS / STUDIES:  11/20 respiratory failure 11/20 CT chest > no PE, low lung volumes, bilateral pleural effusion, cardiomegaly,  11/20 CT head > remote MCA stroke, NAICP 11-21 periods of apparent central apnea during the night and required NIMVS with sats dropping to 60's. 11-23 no desaturations LINES / TUBES: ET tube 11/20>>>11/21 R IJ TLC 11/20>>> R radial a-line 11/20>>>11-22  CULTURES: Blood 11/20>>> Urine 11/20>>>neg Sputum 11/20>>>neg  ANTIBIOTICS: Cefepime 11/20>>> Vancomycin 11/20>>>11-22   SUBJECTIVE:  No distress, more comfortable  VITAL SIGNS: Temp:  [97.8 F (36.6 C)-98.5 F (36.9 C)] 97.9 F (36.6 C) (11/23 0400) Pulse Rate:  [61-76] 76 (11/23 0700) Resp:  [17-37] 28 (11/23 0700) BP: (141-164)/(49-74) 164/74 mmHg (11/23 0700) SpO2:  [92 %-98 %] 96 % (11/23 0700) FiO2 (%):  [40 %] 40 % (11/23 0300) Weight:  [143 lb 1.3 oz (64.9 kg)] 143 lb 1.3 oz (64.9 kg) (11/23 0500) 2 l [place] HEMODYNAMICS: CVP:  [0 mmHg-8 mmHg] 7 mmHg VENTILATOR  SETTINGS:  Vent Mode:  [-]  FiO2 (%):  [40 %] 40 % INTAKE / OUTPUT: Intake/Output     11/22 0701 - 11/23 0700 11/23 0701 - 11/24 0700   I.V. (mL/kg) 440 (6.8)    NG/GT 20    IV Piggyback 652    TPN 2040    Total Intake(mL/kg) 3152 (48.6)    Urine (mL/kg/hr) 1960 (1.3)    Emesis/NG output     Drains     Total Output 1960     Net +1192           PHYSICAL EXAMINATION: General:  Awake and alert HEENT: No LAN?JVD PULM: CTA B. Clear secretions  CV: RRR, no mgr AB: infrequent, soft Ext: trace edema Neuro: awake, follows commands, moves all four ext  LABS:  CBC  Recent Labs Lab 08/16/13 2000 08/17/13 0422 08/18/13 0430  WBC 7.1 7.2 9.8  HGB 10.1* 10.5* 11.2*  HCT 30.1* 30.5* 33.1*  PLT 242 230 276   Coag's No results found for this basename: APTT, INR,  in the last 168 hours BMET  Recent Labs Lab 08/16/13 0420 08/17/13 0422 08/18/13 0430  NA 141 138 132*  K 3.1* 3.0* 3.9  CL 109 104 100  CO2 27 27 25   BUN 18 17 19   CREATININE 0.60 0.52 0.53  GLUCOSE 149* 257* 157*   Electrolytes  Recent Labs Lab 08/16/13 0420 08/16/13 2000 08/17/13 0422 08/18/13 0430  CALCIUM 7.2*  --  7.0* 7.6*  MG 2.2  --   --  1.9  PHOS 1.1* 2.1*  --  2.7  Sepsis Markers  Recent Labs Lab 08/13/13 1330  LATICACIDVEN 1.1   ABG  Recent Labs Lab 08/15/13 0614 08/15/13 1134 08/16/13 0424  PHART 7.438 7.430 7.413  PCO2ART 38.2 39.6 39.6  PO2ART 91.0 87.0 79.0*   Liver Enzymes  Recent Labs Lab 08/13/13 1155 08/16/13 0420  AST 52* 15  ALT 32 13  ALKPHOS 88 57  BILITOT 0.7 0.2*  ALBUMIN 3.4* 2.0*   Cardiac Enzymes No results found for this basename: TROPONINI, PROBNP,  in the last 168 hours Glucose  Recent Labs Lab 08/16/13 1523 08/17/13 0031 08/17/13 0540 08/17/13 1203 08/17/13 1719 08/18/13 0002  GLUCAP 136* 157* 164* 147* 178* 161*    Imaging  CXR:11-23 Endotracheal tube has been removed. Stable left basilar opacity.   ASSESSMENT /  PLAN:  PULMONARY A: VDRF due to aspiration and evolving aspiration PNA > resolving 11/20 CT chest P:   - Extubated. - Incentive spiro - aspiration precautions - Nocturnal bipap, note observation of probable central apneas, no repeat of apnea 11-22 pm  CARDIOVASCULAR A: Sepsis and hypotension > resolved P:  - gentle IVF with D5  RENAL A:  Renal function stable   P:   - Replace electrolytes as needed. - BMET as needed - replete K  GASTROINTESTINAL A:  S/P hernia repair. P:   - NPO w NGT in place - TPN. - Further recommendations per surgery.  HEMATOLOGIC  Recent Labs  08/17/13 0422 08/18/13 0430  HGB 10.5* 11.2*    A:  Stable, no active issues. P:  - Monitor CBC. - Transfusion threshold is 7.  INFECTIOUS A:  Aspiration PNA. P:   - Cont cefepime, vanco d/c'd 11/22 - F/U culture.  ENDOCRINE A:  No diabetes history.   P:   - Monitor CBGs as needed.  NEUROLOGIC  A:  Acute encephalopathy resolving  - head CT no acute dz P:   - follow; ? Component mild dementia  TODAY'S SUMMARY: extubated 11-21, periods of apnea early am 11-22, not repeated 11-23  [facility] Minor ACNP Adolph Pollack PCCM Pager [phone] till 3 pm If no answer page [phone] 08/18/2013, 8:17 AM  Levy Pupa, MD, PhD 08/18/2013, 11:40 AM [facility] [phone] or if no answer [phone]

## 2013-08-18 NOTE — Progress Notes (Signed)
PARENTERAL NUTRITION CONSULT NOTE - FOLLOW UP  Pharmacy Consult:  TPN Indication:  Prolonged ileus  Allergies  Allergen Reactions  . Codeine     unknown    Patient Measurements: Height: 5\' 4"  (162.6 cm) Weight: 143 lb 1.3 oz (64.9 kg) IBW/kg (Calculated) : 54.7  Vital Signs: Temp: 98.2 F (36.8 C) (11/22 2000) Temp src: Oral (11/22 2000) BP: 157/72 mmHg (11/23 0600) Pulse Rate: 68 (11/23 0600) Intake/Output from previous day: 11/22 0701 - 11/23 0700 In: 3152 [I.V.:440; NG/GT:20; IV Piggyback:652; TPN:2040] Out: 1960 [Urine:1960]  Labs:  Recent Labs  08/16/13 2000 08/17/13 0422 08/18/13 0430  WBC 7.1 7.2 9.8  HGB 10.1* 10.5* 11.2*  HCT 30.1* 30.5* 33.1*  PLT 242 230 276     Recent Labs  08/16/13 0420 08/16/13 0430 08/16/13 2000 08/17/13 0422 08/18/13 0430  NA 141  --   --  138 132*  K 3.1*  --   --  3.0* 3.9  CL 109  --   --  104 100  CO2 27  --   --  27 25  GLUCOSE 149*  --   --  257* 157*  BUN 18  --   --  17 19  CREATININE 0.60  --   --  0.52 0.53  CALCIUM 7.2*  --   --  7.0* 7.6*  MG 2.2  --   --   --  1.9  PHOS 1.1*  --  2.1*  --  2.7  PROT 4.7*  --   --   --   --   ALBUMIN 2.0*  --   --   --   --   AST 15  --   --   --   --   ALT 13  --   --   --   --   ALKPHOS 57  --   --   --   --   BILITOT 0.2*  --   --   --   --   PREALBUMIN 7.8*  --   --   --   --   TRIG  --  89  --   --   --    Estimated Creatinine Clearance: 42 ml/min (by C-G formula based on Cr of 0.53).    Recent Labs  08/17/13 1203 08/17/13 1719 08/18/13 0002  GLUCAP 147* 178* 161*      Insulin Requirements in the past 24 hours:  9 units SSI  Assessment: 39 YOF who developed bowel obstruction from a right-sided incarcerated hernia and vomited feculent material and aspirated.  Patient was taken to the OR on 08/13/13 for hernia repair.  She continues on TPN for prolonged ileus.  Pharmacy also managing Cefepime for aspiration PNA.  Patient's renal function has been  stable.  Noted Surgery plans to treat for 7 days and the recommended treatment course for PNA is also 7 days.  Today is day#4 of Cefepime.  Zosyn 11/18 >> 11/20 Cefepime 11/20 >> Vanc 11/20 >> 11/22  11/20 blood x 2 - NGTD 11/20 trach asp - negative  GI: hx diverticulosis - scheduled Zofran, PPI IV, TG WNL, NG was dark red/brown 11/22, now d/c'ed.  Prealbumin low at 7.8. Endo: hx hypothyroidism on IV Synthroid.  No hx DM - CBGs trended up with increased TPN provision but remain < 180 Lytes: K+ improved to 3.9 (goal ~4 for ileus), hyponatremia, others WNL Renal: SCr 0.53 (stable), CrCL 42 ml/min, great UOP 1.3 ml/kg/hr, net positive 4.8L since  admit, D5NS with KCL at [facility] Pulm: extubated, on [place] and sometimes BiPAP Cards: hx HLD / HTN / CHF (EF 55-60%) / pacemaker / CAD - BP trending up, HR 60s, AV-paced, CVP 7 (Norvasc, Lipitor, losartan PTA) Hepatobil: LFTs/tbili WNL Neuro: hx CVA / dementia / TIA - GCS 15 (Aricept, Aggrenox PTA) ID: Cefepime D#4 (11/20 >> ) for asp PNA, afebrile, WBC WNL, +SA PCR, all cx negative thus far, s/p 3d vanc Best Practices: Lovenox, MC, CHG/mupirocin, PPI IV TPN Access: right internal jugular triple lumen placed 11/20 TPN day#: 3 (11/20 >> )  Current Nutrition:  Clinimix E 5/15 at 75 ml/hr + lipids at 10 ml/hr  Nutritional Goals:  1650-1800 kCal, 85-100 grams of protein per day   Plan:  - Continue Clinimix E 5/15 at 75 ml/hr + IVFE at 10 ml/hr, providing 1758 kCal and 90gm protein per day, meeting 100% of needs - Daily multivitamin and trace elements - Continue NS with KCL at 20 ml/hr (providing KCL per day) - F/U nutritional plan post extubation - F/U AM labs - Continue Cefepime 1gm IV Q24H - F/U renal fxn, clinical course, stop date for abx    Rayette Mogg D. Laney Potash, PharmD, BCPS Pager:  [phone] 08/18/2013, 7:27 AM

## 2013-08-18 NOTE — Progress Notes (Signed)
Patient ID: [name], female   DOB: [birth year], 79 y.o.   MRN: [id number]  Subjective: Pt still coughing up quite a bit, good effort.  Up to on IS.  +flatus, no BM.  NG clamped all day yesterday.  No n/v.  Objective:  Vital signs:  Filed Vitals:   08/18/13 0500 08/18/13 0600 08/18/13 0700 08/18/13 0818  BP: 159/66 157/72 164/74   Pulse: 67 68 76   Temp:    98.3 F (36.8 C)  TempSrc:    Oral  Resp: 22 29 28    Height:      Weight: 143 lb 1.3 oz (64.9 kg)     SpO2: 96% 98% 96%        Intake/Output   Yesterday:  11/22 0701 - 11/23 0700 In: 3152 [I.V.:440; NG/GT:20; IV Piggyback:652; TPN:2040] Out: 1960 [Urine:1960] This shift:    I/O last 3 completed shifts: In: 4522 [I.V.:680; NG/GT:80; IV Piggyback:702] Out: 2900 [Urine:2580; Emesis/NG output:300; Drains:20]   Physical Exam:  General: alert, awake and oriented. NAD.  Chest: CTA bilaterally.  CV: Pulses intact. Regular rhythm  Abdomen:bowel sounds are present. RLQ incision is clean, without drainage, staples are intact. RLQ drain without any recorded output last 24hrs.  NGT in place.  Ext: SCDs BLE. No mjr edema. No cyanosis  Skin: No petechiae / purpura    Problem List:   Principal Problem:   Incarcerated femoral hernia Active Problems:   HYPOTHYROIDISM   HYPERLIPIDEMIA   DEMENTIA   HYPERTENSION   AV BLOCK, COMPLETE   PACEMAKER, PERMANENT   Acute respiratory failure   Aspiration pneumonia   Sepsis   Altered mental status    Results:   Labs: Results for orders placed during the hospital encounter of 08/13/13 (from the past 48 hour(s))  GLUCOSE, CAPILLARY     Status: Abnormal   Collection Time    08/16/13  9:05 AM      Result Value Range   Glucose-Capillary 140 (*) 70 - 99 mg/dL  GLUCOSE, CAPILLARY     Status: Abnormal   Collection Time    08/16/13 11:44 AM      Result Value Range   Glucose-Capillary 125 (*) 70 - 99 mg/dL  GLUCOSE, CAPILLARY     Status: Abnormal   Collection Time     08/16/13  3:23 PM      Result Value Range   Glucose-Capillary 136 (*) 70 - 99 mg/dL  PHOSPHORUS     Status: Abnormal   Collection Time    08/16/13  8:00 PM      Result Value Range   Phosphorus 2.1 (*) 2.3 - 4.6 mg/dL  CBC     Status: Abnormal   Collection Time    08/16/13  8:00 PM      Result Value Range   WBC 7.1  4.0 - 10.5 K/uL   RBC 3.19 (*) 3.87 - 5.11 MIL/uL   Hemoglobin 10.1 (*) 12.0 - 15.0 g/dL   HCT 10.2 (*) 72.5 - 36.6 %   MCV 94.4  78.0 - 100.0 fL   MCH 31.7  26.0 - 34.0 pg   MCHC 33.6  30.0 - 36.0 g/dL   RDW 44.0  34.7 - 42.5 %   Platelets 242  150 - 400 K/uL  GLUCOSE, CAPILLARY     Status: Abnormal   Collection Time    08/17/13 12:31 AM      Result Value Range   Glucose-Capillary 157 (*) 70 - 99 mg/dL   Comment  1 Documented in Chart     Comment 2 Notify RN    BASIC METABOLIC PANEL     Status: Abnormal   Collection Time    08/17/13  4:22 AM      Result Value Range   Sodium 138  135 - 145 mEq/L   Potassium 3.0 (*) 3.5 - 5.1 mEq/L   Chloride 104  96 - 112 mEq/L   CO2 27  19 - 32 mEq/L   Glucose, Bld 257 (*) 70 - 99 mg/dL   BUN 17  6 - 23 mg/dL   Creatinine, Ser 8.65  0.50 - 1.10 mg/dL   Calcium 7.0 (*) 8.4 - 10.5 mg/dL   GFR calc non Af Amer 83 (*) >90 mL/min   GFR calc Af Amer >90  >90 mL/min   Comment: (NOTE)     The eGFR has been calculated using the CKD EPI equation.     This calculation has not been validated in all clinical situations.     eGFR's persistently <90 mL/min signify possible Chronic Kidney     Disease.  CBC     Status: Abnormal   Collection Time    08/17/13  4:22 AM      Result Value Range   WBC 7.2  4.0 - 10.5 K/uL   RBC 3.24 (*) 3.87 - 5.11 MIL/uL   Hemoglobin 10.5 (*) 12.0 - 15.0 g/dL   HCT 78.4 (*) 69.6 - 29.5 %   MCV 94.1  78.0 - 100.0 fL   MCH 32.4  26.0 - 34.0 pg   MCHC 34.4  30.0 - 36.0 g/dL   RDW 28.4  13.2 - 44.0 %   Platelets 230  150 - 400 K/uL  GLUCOSE, CAPILLARY     Status: Abnormal   Collection Time     08/17/13  5:40 AM      Result Value Range   Glucose-Capillary 164 (*) 70 - 99 mg/dL   Comment 1 Documented in Chart     Comment 2 Notify RN    GLUCOSE, CAPILLARY     Status: Abnormal   Collection Time    08/17/13 12:03 PM      Result Value Range   Glucose-Capillary 147 (*) 70 - 99 mg/dL   Comment 1 Documented in Chart     Comment 2 Notify RN    GLUCOSE, CAPILLARY     Status: Abnormal   Collection Time    08/17/13  5:19 PM      Result Value Range   Glucose-Capillary 178 (*) 70 - 99 mg/dL   Comment 1 Documented in Chart     Comment 2 Notify RN    GLUCOSE, CAPILLARY     Status: Abnormal   Collection Time    08/18/13 12:02 AM      Result Value Range   Glucose-Capillary 161 (*) 70 - 99 mg/dL   Comment 1 Documented in Chart     Comment 2 Notify RN    BASIC METABOLIC PANEL     Status: Abnormal   Collection Time    08/18/13  4:30 AM      Result Value Range   Sodium 132 (*) 135 - 145 mEq/L   Potassium 3.9  3.5 - 5.1 mEq/L   Comment: DELTA CHECK NOTED   Chloride 100  96 - 112 mEq/L   CO2 25  19 - 32 mEq/L   Glucose, Bld 157 (*) 70 - 99 mg/dL   BUN 19  6 - 23 mg/dL  Creatinine, Ser 0.53  0.50 - 1.10 mg/dL   Calcium 7.6 (*) 8.4 - 10.5 mg/dL   GFR calc non Af Amer 82 (*) >90 mL/min   GFR calc Af Amer >90  >90 mL/min   Comment: (NOTE)     The eGFR has been calculated using the CKD EPI equation.     This calculation has not been validated in all clinical situations.     eGFR's persistently <90 mL/min signify possible Chronic Kidney     Disease.  PHOSPHORUS     Status: None   Collection Time    08/18/13  4:30 AM      Result Value Range   Phosphorus 2.7  2.3 - 4.6 mg/dL  CBC     Status: Abnormal   Collection Time    08/18/13  4:30 AM      Result Value Range   WBC 9.8  4.0 - 10.5 K/uL   RBC 3.57 (*) 3.87 - 5.11 MIL/uL   Hemoglobin 11.2 (*) 12.0 - 15.0 g/dL   HCT 16.1 (*) 09.6 - 04.5 %   MCV 92.7  78.0 - 100.0 fL   MCH 31.4  26.0 - 34.0 pg   MCHC 33.8  30.0 - 36.0 g/dL    RDW 40.9  81.1 - 91.4 %   Platelets 276  150 - 400 K/uL  MAGNESIUM     Status: None   Collection Time    08/18/13  4:30 AM      Result Value Range   Magnesium 1.9  1.5 - 2.5 mg/dL    Imaging / Studies: Dg Chest Port 1 View  08/18/2013   CLINICAL DATA:  Status post hernia repair.  EXAM: PORTABLE CHEST - 1 VIEW  COMPARISON:  August 16, 2013.  FINDINGS: Endotracheal tube has been removed. Nasogastric tube is seen entering stomach. Right internal jugular catheter line and left-sided pacemaker are unchanged in position. Stable cardiomediastinal silhouette. No pneumothorax is noted. Right lung is clear. Stable left basilar opacity is noted concerning for effusion with possible associated pneumonia or atelectasis.  IMPRESSION: Endotracheal tube has been removed. Stable left basilar opacity as described above.   Electronically Signed   By: Roque Lias M.D.   On: 08/18/2013 07:18    Medications / Allergies: per chart  Antibiotics: Anti-infectives   Start     Dose/Rate Route Frequency Ordered Stop   08/15/13 1000  vancomycin (VANCOCIN) IVPB 1000 mg/200 mL premix  Status:  Discontinued     1,000 mg 200 mL/hr over 60 Minutes Intravenous Every 24 hours 08/15/13 0853 08/17/13 0809   08/15/13 1000  ceFEPIme (MAXIPIME) 1 g in dextrose 5 % 50 mL IVPB     1 g 100 mL/hr over 30 Minutes Intravenous Every 24 hours 08/15/13 0856     08/14/13 0031  piperacillin-tazobactam (ZOSYN) IVPB 3.375 g  Status:  Discontinued     3.375 g 12.5 mL/hr over 240 Minutes Intravenous 3 times per day 08/14/13 0031 08/15/13 0739   08/13/13 1715  piperacillin-tazobactam (ZOSYN) IVPB 3.375 g  Status:  Discontinued     3.375 g 12.5 mL/hr over 240 Minutes Intravenous 3 times per day 08/13/13 1700 08/15/13 0852      Assessment/Plan  Hx CVA  Hypothyroidism  CAD, CHF, AVB s/p pacemaker  Hypertension  Dementia, mild  Hypokalemia  Aspiration PNA  Sepsis  VDRF  High grade small bowel obstruction secondary to femoral  hernia inguinal hernia  -s/p primary repair of right femoral hernia with JP drain  placement Dr. Derrell Lolling 08/13/13  -POD #5 -Appreciate CCM assistance  -DC NGT, start clears. -continue with TPN until oral intake improves -?remove drain -IS , encourage use -PT eval and treat -Routine dressing care  -Zosyn changed to cefepime and Vanc day #4, needs a total of 7 days of atbx for abdominal surgery. PNA treatment duration per CCM. -VTE prophylaxis, lovenox, mobilize    Ashok Norris, [facility] Pager [phone] Office [phone]  08/18/2013 8:44 AM

## 2013-08-19 ENCOUNTER — Encounter: Payer: Self-pay | Admitting: *Deleted

## 2013-08-19 DIAGNOSIS — I959 Hypotension, unspecified: Secondary | ICD-10-CM

## 2013-08-19 LAB — COMPREHENSIVE METABOLIC PANEL
ALT: 66 U/L — ABNORMAL HIGH (ref 0–35)
AST: 40 U/L — ABNORMAL HIGH (ref 0–37)
Alkaline Phosphatase: 107 U/L (ref 39–117)
BUN: 19 mg/dL (ref 6–23)
CO2: 24 mEq/L (ref 19–32)
Chloride: 98 mEq/L (ref 96–112)
Creatinine, Ser: 0.56 mg/dL (ref 0.50–1.10)
GFR calc Af Amer: >90 mL/min (ref >90)
GFR calc non Af Amer: 81 mL/min — ABNORMAL LOW (ref >90)
Glucose, Bld: 154 mg/dL — ABNORMAL HIGH (ref 70–99)
Total Bilirubin: 0.2 mg/dL — ABNORMAL LOW (ref 0.3–1.2)
Total Protein: 4.8 g/dL — ABNORMAL LOW (ref 6.0–8.3)

## 2013-08-19 LAB — DIFFERENTIAL
Basophils Absolute: 0 10*3/uL (ref 0.0–0.1)
Basophils Relative: 0 % (ref 0–1)
Eosinophils Absolute: 0.4 10*3/uL (ref 0.0–0.7)
Lymphocytes Relative: 13 % (ref 12–46)
Monocytes Absolute: 1.4 10*3/uL — ABNORMAL HIGH (ref 0.1–1.0)
Neutro Abs: 6.8 10*3/uL (ref 1.7–7.7)
Neutrophils Relative %: 68 % (ref 43–77)

## 2013-08-19 LAB — GLUCOSE, CAPILLARY
Glucose-Capillary: 170 mg/dL — ABNORMAL HIGH (ref 70–99)
Glucose-Capillary: 174 mg/dL — ABNORMAL HIGH (ref 70–99)
Glucose-Capillary: 187 mg/dL — ABNORMAL HIGH (ref 70–99)

## 2013-08-19 LAB — CBC
HCT: 32.2 % — ABNORMAL LOW (ref 36.0–46.0)
MCH: 31.9 pg (ref 26.0–34.0)
MCHC: 34.5 g/dL (ref 30.0–36.0)
RDW: 13 % (ref 11.5–15.5)

## 2013-08-19 LAB — PREALBUMIN: Prealbumin: 15.1 mg/dL — ABNORMAL LOW (ref 17.0–34.0)

## 2013-08-19 LAB — MAGNESIUM: Magnesium: 1.9 mg/dL (ref 1.5–2.5)

## 2013-08-19 LAB — PHOSPHORUS: Phosphorus: 2.7 mg/dL (ref 2.3–4.6)

## 2013-08-19 MED ORDER — FAT EMULSION 20 % IV EMUL
250.0000 mL | INTRAVENOUS | Status: DC
  Filled 2013-08-19: qty 250

## 2013-08-19 MED ORDER — TRACE MINERALS CR-CU-F-FE-I-MN-MO-SE-ZN IV SOLN
INTRAVENOUS | Status: AC
  Administered 2013-08-19: 18:00:00 via INTRAVENOUS
  Filled 2013-08-19: qty 1000

## 2013-08-19 MED ORDER — TRACE MINERALS CR-CU-F-FE-I-MN-MO-SE-ZN IV SOLN
INTRAVENOUS | Status: DC
  Filled 2013-08-19: qty 2000

## 2013-08-19 MED ORDER — FAT EMULSION 20 % IV EMUL
250.0000 mL | INTRAVENOUS | Status: AC
  Administered 2013-08-19: 250 mL via INTRAVENOUS
  Filled 2013-08-19: qty 250

## 2013-08-19 NOTE — Progress Notes (Signed)
PULMONARY  / CRITICAL CARE MEDICINE  Name: [name] MRN: [id number] DOB: 1921-09-30    ADMISSION DATE:  08/13/2013 CONSULTATION DATE:  08/15/2013  REFERRING MD :  CCS PRIMARY SERVICE: CCS  CHIEF COMPLAINT:  Aspiration PNA and respiratory failure  BRIEF PATIENT DESCRIPTION: 79 year old with history of dementia and expressive aphasia who had a hip replacement in October then consequently developed bowel obstruction from a right sided incarcerated hernia who was vomiting feculent material and was noted to have aspirated, patient was taken to the OR on 11/18 for hernia repair.  On the morning of 11/20 patient was noted to be more lethargic and with tachypnea.  BP remained stable.  PCCM was called for further management.  Patient was noted to be very lethargic and airway protection was concerning.  Spoke to son who wanted everything done unless she is in a persistent vegetative state.  SIGNIFICANT EVENTS / STUDIES:  11/20 Respiratory failure 11/20 CT chest > no PE, low lung volumes, bilateral pleural effusion, cardiomegaly 11/20 CT head > remote MCA stroke, NAICP 11/21 Periods of apparent central apnea during the night and required NIMVS with sats dropping to 60's 11/23 No desaturations  LINES / TUBES: ET tube 11/20 >>>11/21 R IJ TLC 11/20 >>> R radial a-line 11/20 >>> 11/22  CULTURES: Blood 11/20 >>> Urine 11/20 >>> neg Sputum 11/20 >>> neg  ANTIBIOTICS: Cefepime 11/20 >>> Vancomycin 11/20 >>>11/22  SUBJECTIVE:  No complaints this AM.  No overnight issues (off BiPAP last night)  VITAL SIGNS: Temp:  [98.2 F (36.8 C)-98.9 F (37.2 C)] 98.4 F (36.9 C) (11/24 0722) Pulse Rate:  [64-80] 70 (11/24 0800) Resp:  [16-42] 28 (11/24 0800) BP: (129-159)/(58-88) 149/59 mmHg (11/24 0800) SpO2:  [90 %-99 %] 94 % (11/24 0800) Weight:  [62.4 kg (137 lb 9.1 oz)] 62.4 kg (137 lb 9.1 oz) (11/24 0600) 2 l [place]  HEMODYNAMICS: CVP:  [0 mmHg-13 mmHg] 0 mmHg  VENTILATOR SETTINGS:    INTAKE / OUTPUT: Intake/Output     11/23 0701 - 11/24 0700 11/24 0701 - 11/25 0700   P.O. 300    I.V. (mL/kg) 480 (7.7) 20 (0.3)   NG/GT     IV Piggyback 50    TPN 2040 85   Total Intake(mL/kg) 2870 (46) 105 (1.7)   Urine (mL/kg/hr) 2260 (1.5) 200 (1.9)   Stool 1 (0)    Total Output 2261 200   Net +609 -95        Stool Occurrence 1 x     PHYSICAL EXAMINATION: General:  Comfortable, no distress Neuro:  Awake, alert, cooperative HEENT:  PERRL, moist membranes Cardiovascular:  RRR, no m/r/g Lungs:  Bilateral air entry, no added breath sounds Abdomen:  Soft, nontender, bowel sounds diminished Musculoskeletal:  Moves all extremities, trace edema Skin:  Intact  LABS:  CBC  Recent Labs Lab 08/17/13 0422 08/18/13 0430 08/19/13 0400  WBC 7.2 9.8 9.9  HGB 10.5* 11.2* 11.1*  HCT 30.5* 33.1* 32.2*  PLT 230 276 255   Coag's No results found for this basename: APTT, INR,  in the last 168 hours  BMET  Recent Labs Lab 08/17/13 0422 08/18/13 0430 08/19/13 0400  NA 138 132* 131*  K 3.0* 3.9 3.8  CL 104 100 98  CO2 27 25 24   BUN 17 19 19   CREATININE 0.52 0.53 0.56  GLUCOSE 257* 157* 154*   Electrolytes  Recent Labs Lab 08/16/13 0420 08/16/13 2000 08/17/13 0422 08/18/13 0430 08/19/13 0400  CALCIUM 7.2*  --  7.0* 7.6* 7.5*  MG 2.2  --   --  1.9 1.9  PHOS 1.1* 2.1*  --  2.7 2.7   Sepsis Markers  Recent Labs Lab 08/13/13 1330  LATICACIDVEN 1.1   ABG  Recent Labs Lab 08/15/13 0614 08/15/13 1134 08/16/13 0424  PHART 7.438 7.430 7.413  PCO2ART 38.2 39.6 39.6  PO2ART 91.0 87.0 79.0*   Liver Enzymes  Recent Labs Lab 08/13/13 1155 08/16/13 0420 08/19/13 0400  AST 52* 15 40*  ALT 32 13 66*  ALKPHOS 88 57 107  BILITOT 0.7 0.2* 0.2*  ALBUMIN 3.4* 2.0* 1.9*   Cardiac Enzymes No results found for this basename: TROPONINI, PROBNP,  in the last 168 hours  Glucose  Recent Labs Lab 08/17/13 1203 08/17/13 1719 08/18/13 0002 08/18/13 1257  08/18/13 1724 08/18/13 2341  GLUCAP 147* 178* 161* 160* 157* 187*   CXR:  None today  ASSESSMENT / PLAN:  Aspiration pneumonia / pneumonitis - resolving Acute respiratory failure - resolved Sepsis / hypotension - resolved Acute encephalopathy - resolved  -->  Post op care per CCS -->  D/c BiPAP -->  Supplemental oxygen to goal SpO2>92 -->  Would d/c abx after 7 days, unless indications other then pneumonia -->  Incentive spirometry / cough / OOB -->  PCCM will sign off, please re consult if necessary  I have personally obtained history, examined patient, evaluated and interpreted laboratory and imaging results, reviewed medical records, formulated assessment / plan and placed orders.  Lonia Farber, MD Pulmonary and Critical Care Medicine [facility] Pager: [phone]  08/19/2013, 8:55 AM

## 2013-08-19 NOTE — Progress Notes (Addendum)
PARENTERAL NUTRITION CONSULT NOTE - FOLLOW UP  Pharmacy Consult:  TPN Indication:  Prolonged ileus  Allergies  Allergen Reactions  . Codeine     unknown    Patient Measurements: Height: 5\' 4"  (162.6 cm) Weight: 137 lb 9.1 oz (62.4 kg) IBW/kg (Calculated) : 54.7  Vital Signs: Temp: 98.4 F (36.9 C) (11/24 0722) Temp src: Oral (11/24 0722) BP: 149/77 mmHg (11/24 0700) Pulse Rate: 66 (11/24 0700) Intake/Output from previous day: 11/23 0701 - 11/24 0700 In: 2870 [P.O.:300; I.V.:480; IV Piggyback:50; TPN:2040] Out: 2261 [Urine:2260; Stool:1]  Labs:  Recent Labs  08/17/13 0422 08/18/13 0430 08/19/13 0400  WBC 7.2 9.8 9.9  HGB 10.5* 11.2* 11.1*  HCT 30.5* 33.1* 32.2*  PLT 230 276 255     Recent Labs  08/16/13 2000 08/17/13 0422 08/18/13 0430 08/19/13 0400  NA  --  138 132* 131*  K  --  3.0* 3.9 3.8  CL  --  104 100 98  CO2  --  27 25 24   GLUCOSE  --  257* 157* 154*  BUN  --  17 19 19   CREATININE  --  0.52 0.53 0.56  CALCIUM  --  7.0* 7.6* 7.5*  MG  --   --  1.9 1.9  PHOS 2.1*  --  2.7 2.7  PROT  --   --   --  4.8*  ALBUMIN  --   --   --  1.9*  AST  --   --   --  40*  ALT  --   --   --  66*  ALKPHOS  --   --   --  107  BILITOT  --   --   --  0.2*  TRIG  --   --   --  136   Estimated Creatinine Clearance: 42 ml/min (by C-G formula based on Cr of 0.56).    Recent Labs  08/18/13 1257 08/18/13 1724 08/18/13 2341  GLUCAP 160* 157* 187*      Insulin Requirements in the past 24 hours:  12 units Novolog SSI  Assessment: 38 YOF who developed bowel obstruction from a right-sided incarcerated hernia and vomited feculent material and aspirated.  Patient was taken to the OR on 08/13/13 for hernia repair.  She continues on TPN for prolonged ileus.  GI: hx diverticulosis - scheduled Zofran, PPI IV, TG WNL, NGT d/c 11/23.  Prealbumin low at 7.8. Pt on clear liquid diet - took ~315ml past 24 hours and tolerating. Plan to advance to full liquid diet today and  begin to wean TPN. Endo: hx hypothyroidism on IV Synthroid.  No hx DM - CBGs trended up with increased TPN provision but majority remain < 180 Lytes: K+ 3.8 (goal ~4 for ileus), hyponatremia, others WNL Renal: SCr stable, CrCL ~42 ml/min, UOP 1.3 ml/kg/hr, D5NS with KCL at [facility] Pulm: RA. Cards: hx HLD / HTN / CHF (EF 55-60%) / pacemaker / CAD - BP elevated, HR 60s, AV-paced, CVP 3-5 (Norvasc, Lipitor, losartan PTA) Hepatobil: LFTs/tbili WNL Neuro: hx CVA / dementia / TIA - GCS 15 (Aricept, Aggrenox PTA) ID: Cefepime D#5 (11/20 >> ) for asp PNA, afebrile, WBC WNL, +SA PCR, all cx negative thus far, s/p 3d vanc Best Practices: Lovenox, MC, CHG/mupirocin, PPI IV TPN Access: right internal jugular triple lumen placed 11/20 TPN day#: 4 (11/20 >> )  Current Nutrition:  Clinimix E 5/15 at 75 ml/hr + lipids at 10 ml/hr providing 1758 kCal and 90gm protein per day  Nutritional Goals:  1650-1800 kCal, 85-100 grams of protein per day  Plan:  - Decrease Clinimix E 5/15 to 40 ml/hr + IVFE at 10 ml/hr to meet ~50% of needs - Daily multivitamin and trace elements - Continue NS with KCL at 20 ml/hr (providing KCL per day) - F/u po intake of full liquids and ability to advance diet. Hopefully can d/c TPN tomorrow.  Christoper Fabian, PharmD, BCPS Clinical pharmacist, pager [phone] 08/19/2013, 7:47 AM

## 2013-08-19 NOTE — Progress Notes (Signed)
Patient is sitting up in a chair looking great.  Sats are 94%.  CCM signed off.  Will transfer out tomorrow.  [name]. Gae Bon, MD, FACS [phone] [facility]

## 2013-08-19 NOTE — Progress Notes (Signed)
Patient ID: [name], female   DOB: May 04, 1922, 79 y.o.   MRN: [id number]  Subjective: Pt had a BM yesterday.  Tolerating liquids.  She is sitting up in a chair.  Denies shortness of breath.  Denies chest pains.  Denies n/v.  Drain out yesterday.  White count is normal.  VSS.  She is on room air now.    Objective:  Vital signs:  Filed Vitals:   08/19/13 0500 08/19/13 0600 08/19/13 0700 08/19/13 0722  BP: 139/63 145/84 149/77   Pulse: 76 76 66   Temp:    98.4 F (36.9 C)  TempSrc:    Oral  Resp: 39 17 23   Height:      Weight:  137 lb 9.1 oz (62.4 kg)    SpO2: 97% 94% 97%        Intake/Output   Yesterday:  11/23 0701 - 11/24 0700 In: 2870 [P.O.:300; I.V.:480; IV Piggyback:50; TPN:2040] Out: 2261 [Urine:2260; Stool:1] This shift:    I/O last 3 completed shifts: In: 4130 [P.O.:300; I.V.:720; IV Piggyback:50] Out: 3266 [Urine:3265; Stool:1]    Physical Exam:  General: alert, awake and oriented. NAD.  Chest: CTA bilaterally.  CV: Pulses intact. Regular rhythm  Abdomen:bowel sounds are present. RLQ incision is clean, without drainage, staples are intact. Ext: SCDs BLE. No mjr edema. No cyanosis  Skin: No petechiae / purpura   Problem List:   Principal Problem:   Incarcerated femoral hernia Active Problems:   HYPOTHYROIDISM   HYPERLIPIDEMIA   DEMENTIA   HYPERTENSION   AV BLOCK, COMPLETE   PACEMAKER, PERMANENT   Acute respiratory failure   Aspiration pneumonia   Sepsis   Altered mental status    Results:   Labs: Results for orders placed during the hospital encounter of 08/13/13 (from the past 48 hour(s))  GLUCOSE, CAPILLARY     Status: Abnormal   Collection Time    08/17/13 12:03 PM      Result Value Range   Glucose-Capillary 147 (*) 70 - 99 mg/dL   Comment 1 Documented in Chart     Comment 2 Notify RN    GLUCOSE, CAPILLARY     Status: Abnormal   Collection Time    08/17/13  5:19 PM      Result Value Range   Glucose-Capillary 178 (*) 70 -  99 mg/dL   Comment 1 Documented in Chart     Comment 2 Notify RN    GLUCOSE, CAPILLARY     Status: Abnormal   Collection Time    08/18/13 12:02 AM      Result Value Range   Glucose-Capillary 161 (*) 70 - 99 mg/dL   Comment 1 Documented in Chart     Comment 2 Notify RN    BASIC METABOLIC PANEL     Status: Abnormal   Collection Time    08/18/13  4:30 AM      Result Value Range   Sodium 132 (*) 135 - 145 mEq/L   Potassium 3.9  3.5 - 5.1 mEq/L   Comment: DELTA CHECK NOTED   Chloride 100  96 - 112 mEq/L   CO2 25  19 - 32 mEq/L   Glucose, Bld 157 (*) 70 - 99 mg/dL   BUN 19  6 - 23 mg/dL   Creatinine, Ser 0.27  0.50 - 1.10 mg/dL   Calcium 7.6 (*) 8.4 - 10.5 mg/dL   GFR calc non Af Amer 82 (*) >90 mL/min   GFR calc Af Amer >90  >  90 mL/min   Comment: (NOTE)     The eGFR has been calculated using the CKD EPI equation.     This calculation has not been validated in all clinical situations.     eGFR's persistently <90 mL/min signify possible Chronic Kidney     Disease.  PHOSPHORUS     Status: None   Collection Time    08/18/13  4:30 AM      Result Value Range   Phosphorus 2.7  2.3 - 4.6 mg/dL  CBC     Status: Abnormal   Collection Time    08/18/13  4:30 AM      Result Value Range   WBC 9.8  4.0 - 10.5 K/uL   RBC 3.57 (*) 3.87 - 5.11 MIL/uL   Hemoglobin 11.2 (*) 12.0 - 15.0 g/dL   HCT 91.4 (*) 78.2 - 95.6 %   MCV 92.7  78.0 - 100.0 fL   MCH 31.4  26.0 - 34.0 pg   MCHC 33.8  30.0 - 36.0 g/dL   RDW 21.3  08.6 - 57.8 %   Platelets 276  150 - 400 K/uL  MAGNESIUM     Status: None   Collection Time    08/18/13  4:30 AM      Result Value Range   Magnesium 1.9  1.5 - 2.5 mg/dL  GLUCOSE, CAPILLARY     Status: Abnormal   Collection Time    08/18/13 12:57 PM      Result Value Range   Glucose-Capillary 160 (*) 70 - 99 mg/dL  GLUCOSE, CAPILLARY     Status: Abnormal   Collection Time    08/18/13  5:24 PM      Result Value Range   Glucose-Capillary 157 (*) 70 - 99 mg/dL   Comment 1  Documented in Chart     Comment 2 Notify RN    GLUCOSE, CAPILLARY     Status: Abnormal   Collection Time    08/18/13 11:41 PM      Result Value Range   Glucose-Capillary 187 (*) 70 - 99 mg/dL   Comment 1 Documented in Chart     Comment 2 Notify RN    COMPREHENSIVE METABOLIC PANEL     Status: Abnormal   Collection Time    08/19/13  4:00 AM      Result Value Range   Sodium 131 (*) 135 - 145 mEq/L   Potassium 3.8  3.5 - 5.1 mEq/L   Chloride 98  96 - 112 mEq/L   CO2 24  19 - 32 mEq/L   Glucose, Bld 154 (*) 70 - 99 mg/dL   BUN 19  6 - 23 mg/dL   Creatinine, Ser 4.69  0.50 - 1.10 mg/dL   Calcium 7.5 (*) 8.4 - 10.5 mg/dL   Total Protein 4.8 (*) 6.0 - 8.3 g/dL   Albumin 1.9 (*) 3.5 - 5.2 g/dL   AST 40 (*) 0 - 37 U/L   ALT 66 (*) 0 - 35 U/L   Alkaline Phosphatase 107  39 - 117 U/L   Total Bilirubin 0.2 (*) 0.3 - 1.2 mg/dL   GFR calc non Af Amer 81 (*) >90 mL/min   GFR calc Af Amer >90  >90 mL/min   Comment: (NOTE)     The eGFR has been calculated using the CKD EPI equation.     This calculation has not been validated in all clinical situations.     eGFR's persistently <90 mL/min signify possible Chronic Kidney  Disease.  MAGNESIUM     Status: None   Collection Time    08/19/13  4:00 AM      Result Value Range   Magnesium 1.9  1.5 - 2.5 mg/dL  PHOSPHORUS     Status: None   Collection Time    08/19/13  4:00 AM      Result Value Range   Phosphorus 2.7  2.3 - 4.6 mg/dL  CBC     Status: Abnormal   Collection Time    08/19/13  4:00 AM      Result Value Range   WBC 9.9  4.0 - 10.5 K/uL   RBC 3.48 (*) 3.87 - 5.11 MIL/uL   Hemoglobin 11.1 (*) 12.0 - 15.0 g/dL   HCT 40.9 (*) 81.1 - 91.4 %   MCV 92.5  78.0 - 100.0 fL   MCH 31.9  26.0 - 34.0 pg   MCHC 34.5  30.0 - 36.0 g/dL   RDW 78.2  95.6 - 21.3 %   Platelets 255  150 - 400 K/uL  DIFFERENTIAL     Status: Abnormal   Collection Time    08/19/13  4:00 AM      Result Value Range   Neutrophils Relative % 68  43 - 77 %    Neutro Abs 6.8  1.7 - 7.7 K/uL   Lymphocytes Relative 13  12 - 46 %   Lymphs Abs 1.3  0.7 - 4.0 K/uL   Monocytes Relative 14 (*) 3 - 12 %   Monocytes Absolute 1.4 (*) 0.1 - 1.0 K/uL   Eosinophils Relative 4  0 - 5 %   Eosinophils Absolute 0.4  0.0 - 0.7 K/uL   Basophils Relative 0  0 - 1 %   Basophils Absolute 0.0  0.0 - 0.1 K/uL  TRIGLYCERIDES     Status: None   Collection Time    08/19/13  4:00 AM      Result Value Range   Triglycerides 136  <150 mg/dL    Imaging / Studies: Dg Chest Port 1 View  08/18/2013   CLINICAL DATA:  Status post hernia repair.  EXAM: PORTABLE CHEST - 1 VIEW  COMPARISON:  August 16, 2013.  FINDINGS: Endotracheal tube has been removed. Nasogastric tube is seen entering stomach. Right internal jugular catheter line and left-sided pacemaker are unchanged in position. Stable cardiomediastinal silhouette. No pneumothorax is noted. Right lung is clear. Stable left basilar opacity is noted concerning for effusion with possible associated pneumonia or atelectasis.  IMPRESSION: Endotracheal tube has been removed. Stable left basilar opacity as described above.   Electronically Signed   By: Roque Lias M.D.   On: 08/18/2013 07:18    Medications / Allergies: per chart  Antibiotics: Anti-infectives   Start     Dose/Rate Route Frequency Ordered Stop   08/15/13 1000  vancomycin (VANCOCIN) IVPB 1000 mg/200 mL premix  Status:  Discontinued     1,000 mg 200 mL/hr over 60 Minutes Intravenous Every 24 hours 08/15/13 0853 08/17/13 0809   08/15/13 1000  ceFEPIme (MAXIPIME) 1 g in dextrose 5 % 50 mL IVPB     1 g 100 mL/hr over 30 Minutes Intravenous Every 24 hours 08/15/13 0856     08/14/13 0031  piperacillin-tazobactam (ZOSYN) IVPB 3.375 g  Status:  Discontinued     3.375 g 12.5 mL/hr over 240 Minutes Intravenous 3 times per day 08/14/13 0031 08/15/13 0739   08/13/13 1715  piperacillin-tazobactam (ZOSYN) IVPB 3.375 g  Status:  Discontinued     3.375 g 12.5 mL/hr over 240  Minutes Intravenous 3 times per day 08/13/13 1700 08/15/13 1610      Assessment/Plan  Hx CVA  Hypothyroidism  CAD, CHF, AVB s/p pacemaker  Hypertension  Dementia, mild  Hypokalemia  Aspiration PNA  Sepsis  VDRF  High grade small bowel obstruction secondary to femoral hernia inguinal hernia  -s/p primary repair of right femoral hernia with JP drain placement Dr. Derrell Lolling 08/13/13  -POD #6  -Appreciate CCM assistance  -advance to full liquids,  -start to wean TPN -IS , encourage use  -PT eval and treat  -DC foley -Zosyn changed to cefepime and Vanc day #5, needs a total of 7 days of atbx for abdominal surgery. PNA treatment duration per CCM.  -VTE prophylaxis, lovenox, mobilize   Dispo: transfer to stepdown if okay with CCM   Ashok Norris, [facility] Pager [phone] Office [phone]  08/19/2013 8:01 AM

## 2013-08-19 NOTE — Progress Notes (Signed)
Physical Therapy Treatment Patient Details Name: [name] MRN: [id number] DOB: Mar 07, 1922 Today's Date: 08/19/2013 Time: 4098-1191 PT Time Calculation (min): 24 min  PT Assessment / Plan / Recommendation  History of Present Illness Patient admitted with N&V underwent small bowel resection and right femoral hernia repair.  s/p left hip hemiarthroplasty 5 weeks ago due to fall with femoral fracture.  She was at [facility] for rehab and prior to fall lived at [facility].   PT Comments   Pt appears less confused than previous PT session. Pt tolerated incr activity while on room air, although with incr fatigue. Near constant cues for technique while ambulating. Will benefit from continued skilled PT.   Follow Up Recommendations  SNF     Does the patient have the potential to tolerate intense rehabilitation     Barriers to Discharge        Equipment Recommendations  None recommended by PT    Recommendations for Other Services    Frequency Min 3X/week   Progress towards PT Goals Progress towards PT goals: Progressing toward goals  Plan Current plan remains appropriate    Precautions / Restrictions Precautions Precautions: Fall;Posterior Hip   Pertinent Vitals/Pain Denied pain; HR 84-90, SaO2 94-96% on RA    Mobility  Bed Mobility Bed Mobility: Supine to Sit;Sitting - Scoot to Edge of Bed Supine to Sit: 1: +2 Total assist;HOB flat Supine to Sit: Patient Percentage: 40% Sitting - Scoot to Edge of Bed: 3: Mod assist Details for Bed Mobility Assistance: vc for sequencing to maintain hip precautions; assist to raise torso and prevent excessive hip flexion as coming up to EOB Transfers Transfers: Sit to Stand;Stand to Sit Sit to Stand: 3: Mod assist Stand to Sit: 3: Mod assist Details for Transfer Assistance: vc for sequencing; assist for vertical rise while preventing excessive hip flexion Ambulation/Gait Ambulation/Gait Assistance: 1: +2 Total  assist Ambulation/Gait: Patient Percentage: 70% Ambulation Distance (Feet): 70 Feet Assistive device: Rolling walker Ambulation/Gait Assistance Details: assist to maneuver RW and for upright posture; cues for incr stride length with pt able to perform, yet quickly reverts back to short, choppy steps (requires near constant cuing) Gait Pattern: Step-through pattern;Decreased stride length;Trunk flexed    Exercises General Exercises - Lower Extremity Ankle Circles/Pumps: AROM;Both;20 reps;Supine Long Arc Quad: AROM;Both;10 reps;Seated Heel Slides: AAROM;Both;5 reps;Supine   PT Diagnosis:    PT Problem List:   PT Treatment Interventions:     PT Goals (current goals can now be found in the care plan section)    Visit Information  Last PT Received On: 08/19/13 Assistance Needed: +2 (for lines/chair) History of Present Illness: Patient admitted with N&V underwent small bowel resection and right femoral hernia repair.  s/p left hip hemiarthroplasty 5 weeks ago due to fall with femoral fracture.  She was at [facility] for rehab and prior to fall lived at [facility].    Subjective Data  Subjective: "I'll do what you ask me to. "   Cognition  Cognition Arousal/Alertness: Awake/alert Behavior During Therapy: WFL for tasks assessed/performed Overall Cognitive Status: No family/caregiver present to determine baseline cognitive functioning Orientation Level: Situation Memory: Decreased recall of precautions General Comments: Thinks she is in hospital because she now broke her rt hip; does not recall posterior hip precautions on her LLE    Balance     End of Session PT - End of Session Equipment Utilized During Treatment: Gait belt Activity Tolerance: Patient limited by fatigue Patient left: in chair;with call bell/phone  within reach Nurse Communication: Mobility status   GP     Berdena Cisek 08/19/2013, 4:00 PM Pager [phone]

## 2013-08-20 DIAGNOSIS — E039 Hypothyroidism, unspecified: Secondary | ICD-10-CM

## 2013-08-20 DIAGNOSIS — I10 Essential (primary) hypertension: Secondary | ICD-10-CM

## 2013-08-20 DIAGNOSIS — F0390 Unspecified dementia without behavioral disturbance: Secondary | ICD-10-CM

## 2013-08-20 LAB — GLUCOSE, CAPILLARY
Glucose-Capillary: 121 mg/dL — ABNORMAL HIGH (ref 70–99)
Glucose-Capillary: 134 mg/dL — ABNORMAL HIGH (ref 70–99)
Glucose-Capillary: 136 mg/dL — ABNORMAL HIGH (ref 70–99)
Glucose-Capillary: 78 mg/dL (ref 70–99)
Glucose-Capillary: 94 mg/dL (ref 70–99)

## 2013-08-20 MED ORDER — LOSARTAN POTASSIUM 50 MG PO TABS: 100.0000 mg | ORAL_TABLET | Freq: Every day | ORAL | Status: DC

## 2013-08-20 MED ORDER — PANTOPRAZOLE SODIUM 40 MG PO TBEC
40.0000 mg | DELAYED_RELEASE_TABLET | Freq: Every day | ORAL | Status: DC
  Administered 2013-08-20 – 2013-08-23 (×4): 40 mg via ORAL
  Filled 2013-08-20 (×4): qty 1

## 2013-08-20 MED ORDER — ENSURE COMPLETE PO LIQD
237.0000 mL | Freq: Two times a day (BID) | ORAL | Status: DC
  Administered 2013-08-20 – 2013-08-23 (×7): 237 mL via ORAL

## 2013-08-20 MED ORDER — DOCUSATE SODIUM 100 MG PO CAPS
100.0000 mg | ORAL_CAPSULE | Freq: Two times a day (BID) | ORAL | Status: DC
  Administered 2013-08-20 – 2013-08-22 (×5): 100 mg via ORAL
  Filled 2013-08-20 (×5): qty 1

## 2013-08-20 MED ORDER — INSULIN ASPART 100 UNIT/ML ~~LOC~~ SOLN
0.0000 [IU] | Freq: Three times a day (TID) | SUBCUTANEOUS | Status: DC
  Administered 2013-08-20: 2 [IU] via SUBCUTANEOUS
  Administered 2013-08-21: 3 [IU] via SUBCUTANEOUS
  Administered 2013-08-21: 2 [IU] via SUBCUTANEOUS
  Administered 2013-08-22 (×2): 3 [IU] via SUBCUTANEOUS

## 2013-08-20 MED ORDER — TRAMADOL HCL 50 MG PO TABS
50.0000 mg | ORAL_TABLET | Freq: Four times a day (QID) | ORAL | Status: DC | PRN
  Administered 2013-08-21: 50 mg via ORAL
  Filled 2013-08-20: qty 1

## 2013-08-20 MED ORDER — LOSARTAN POTASSIUM 50 MG PO TABS
100.0000 mg | ORAL_TABLET | Freq: Every day | ORAL | Status: DC
  Administered 2013-08-20 – 2013-08-23 (×4): 100 mg via ORAL
  Filled 2013-08-20 (×4): qty 2

## 2013-08-20 MED ORDER — LEVOTHYROXINE SODIUM 137 MCG PO TABS
137.0000 ug | ORAL_TABLET | Freq: Every day | ORAL | Status: DC
  Administered 2013-08-20 – 2013-08-23 (×4): 137 ug via ORAL
  Filled 2013-08-20 (×5): qty 1

## 2013-08-20 MED ORDER — DONEPEZIL HCL 10 MG PO TABS
10.0000 mg | ORAL_TABLET | Freq: Every day | ORAL | Status: DC
  Administered 2013-08-20 – 2013-08-23 (×4): 10 mg via ORAL
  Filled 2013-08-20 (×4): qty 1

## 2013-08-20 NOTE — Progress Notes (Signed)
Clinical Social Work Department CLINICAL SOCIAL WORK PLACEMENT NOTE 08/20/2013  Patient:  WAYNE, BRUNKER  Account Number:  [id number] Admit date:  08/13/2013  Clinical Social Worker:  Carren Rang  Date/time:  08/20/2013 03:48 PM  Clinical Social Work is seeking post-discharge placement for this patient at the following level of care:   SKILLED NURSING   (*CSW will update this form in Epic as items are completed)   08/20/2013  Patient/family provided with [facility] Department of Clinical Social Work's list of facilities offering this level of care within the geographic area requested by the patient (or if unable, by the patient's family).  08/20/2013  Patient/family informed of their freedom to choose among providers that offer the needed level of care, that participate in Medicare, Medicaid or managed care program needed by the patient, have an available bed and are willing to accept the patient.  08/20/2013  Patient/family informed of [facility]' ownership interest in [facility], as well as of the fact that they are under no obligation to receive care at this facility.  PASARR submitted to EDS on  PASARR number received from EDS on   FL2 transmitted to all facilities in geographic area requested by pt/family on  08/20/2013 FL2 transmitted to all facilities within larger geographic area on   Patient informed that his/her managed care company has contracts with or will negotiate with  certain facilities, including the following:     Patient/family informed of bed offers received:   Patient chooses bed at  Physician recommends and patient chooses bed at    Patient to be transferred to  on   Patient to be transferred to facility by   The following physician request were entered in Epic:   Additional Comments:  Maree Krabbe, MSW, [employer] [phone]

## 2013-08-20 NOTE — Progress Notes (Signed)
Clinical Social Work Department BRIEF PSYCHOSOCIAL ASSESSMENT 08/20/2013  Patient:  [name], [name]     Account Number:  [id number]     Admit date:  08/13/2013  Clinical Social Worker:  Carren Rang  Date/Time:  08/20/2013 03:44 PM  Referred by:  Care Management  Date Referred:  08/20/2013 Referred for  SNF Placement   Other Referral:   Interview type:  Family Other interview type:   CSW spoke to patient's son over the phone.    PSYCHOSOCIAL DATA Living Status:  FACILITY Admitted from facility:  [name] Level of care:  Skilled Nursing Facility Primary support name:  [name] Primary support relationship to patient:  CHILD, ADULT Degree of support available:   Good    CURRENT CONCERNS Current Concerns  Post-Acute Placement   Other Concerns:    SOCIAL WORK ASSESSMENT / PLAN CSW received referral from Case Manager that patient is from a facility, but family does not want her to return back upon discharge. Per CM, patient wants son to help in decision making. CSW called son and spoke to him about SNF options. Patient's son stated [name] is not his first preference, patient has been there 3 times and is not happy with their care. Patient's son states he wants the patient to have a private room and wants to know which facility has the best care. CSW explained that csw is not able to provide opinion on facilities, but encouraged patient's son to visit facilities. CSW explained that she willl update patient's son with bed offers once they arise. CSW has completed FL2 for MD signature.   Assessment/plan status:  Psychosocial Support/Ongoing Assessment of Needs Other assessment/ plan:   Information/referral to community resources:   SNF information    PATIENT'S/FAMILY'S RESPONSE TO PLAN OF CARE: Patient's son states he prefers patient to go somewhere else, besides [name].       [name], MSW, Theresia Majors [phone]

## 2013-08-20 NOTE — Progress Notes (Signed)
NUTRITION FOLLOW UP  Intervention:    Ensure Complete twice daily (350 kcals, 13 gm protein per 8 fl oz bottle) RD to follow for nutrition care plan  Nutrition Dx:   Inadequate oral intake related to altered GI function as evidenced by NPO status, ongoing  New Goal:   Oral intake with meals & supplements to meet >/= 90% of estimated nutrition needs, progressing  Monitor:   PO intake, weight, labs, I/O's  Assessment:   Patient with PMH of dementia and expressive aphasia who presented to ED from [facility] via EMS with nausea, vomiting and abdominal pain; CT abdomen showed high-grade SBO resulting from a right inguinal hernia.   Patient s/p procedure 11/18:  TISSUE REPAIR OF RIGHT STRANGULATED FEMORAL HERNIA  SMALL BOWEL RESECTION & ANASTOMOSIS  Pharmacy note reviewed -- plan to wean patient off TPN tonight when current bag ends.  Patient reports "I'm taking it one day at a time".  Diet advanced to Full Liquids 11/24.  NGT discontinued.  No nausea or vomiting.  + BM's, flatus.  Amenable to chocolate Ensure supplements -- RD to order.  Height: Ht Readings from Last 1 Encounters:  08/14/13 5\' 4"  (1.626 m)    Weight Status:   Wt Readings from Last 1 Encounters:  08/19/13 137 lb 9.1 oz (62.4 kg)    Re-estimated needs:  Kcal: 1600-1800 Protein: 85-95 gm Fluid: 1.6-1.8 L  Skin: abdominal surgical incision   Diet Order: Full Liquid   Intake/Output Summary (Last 24 hours) at 08/20/13 1006 Last data filed at 08/20/13 0927  Gross per 24 hour  Intake 2558.08 ml  Output      0 ml  Net 2558.08 ml    Labs:   Recent Labs Lab 08/16/13 0420 08/16/13 2000 08/17/13 0422 08/18/13 0430 08/19/13 0400  NA 141  --  138 132* 131*  K 3.1*  --  3.0* 3.9 3.8  CL 109  --  104 100 98  CO2 27  --  27 25 24   BUN 18  --  17 19 19   CREATININE 0.60  --  0.52 0.53 0.56  CALCIUM 7.2*  --  7.0* 7.6* 7.5*  MG 2.2  --   --  1.9 1.9  PHOS 1.1* 2.1*  --  2.7 2.7  GLUCOSE 149*  --   257* 157* 154*    CBG (last 3)   Recent Labs  08/19/13 1735 08/19/13 2352 08/20/13 0818  GLUCAP 174* 121* 136*    Scheduled Meds: . ceFEPime (MAXIPIME) IV  1 g Intravenous Q24H  . docusate sodium  100 mg Oral BID  . donepezil  10 mg Oral Daily  . enoxaparin (LOVENOX) injection  40 mg Subcutaneous Q24H  . insulin aspart  0-15 Units Subcutaneous TID WC  . levothyroxine  137 mcg Oral QAC breakfast  . losartan  100 mg Oral Daily  . ondansetron  4 mg Intravenous Q6H  . pantoprazole  40 mg Oral Daily  . sodium chloride  10-40 mL Intracatheter Q12H    Continuous Infusions: . 0.9 % NaCl with KCl 40 mEq / L 20 mL/hr at 08/19/13 1500  . Marland KitchenTPN (CLINIMIX-E) Adult 40 mL/hr at 08/19/13 1731   And  . fat emulsion 250 mL (08/19/13 1731)    Maureen Chatters, RD, LDN Pager #: [phone] After-Hours Pager #: [phone]

## 2013-08-20 NOTE — Progress Notes (Signed)
Patient ID: [name], female   DOB: [birth year], 79 y.o.   MRN: [id number]   Subjective: Per nursing the patient vomited x1 last night.  Denies n/v today.  +flatus.  BM 2 days ago.    Objective:  Vital signs:  Filed Vitals:   08/20/13 0400 08/20/13 0500 08/20/13 0600 08/20/13 0700  BP: 133/54 132/82 148/54 154/56  Pulse: 65 82 61 76  Temp: 99.2 F (37.3 C)     TempSrc:      Resp: 38 31 43 28  Height:      Weight:      SpO2: 92% 91% 98% 100%       Intake/Output   Yesterday:  11/24 0701 - 11/25 0700 In: 2613.1 [P.O.:60; I.V.:480; IV Piggyback:50; TPN:2023.1] Out: 400 [Urine:400] This shift:    I/O last 3 completed shifts: In: 3933.1 [P.O.:120; I.V.:720; IV Piggyback:50] Out: 1610 [Urine:1610]    Physical Exam:  General: alert, awake and oriented. NAD.  Chest: CTA bilaterally.  CV: Pulses intact. Regular rhythm  Abdomen:bowel sounds are present. RLQ incision is clean, without drainage, staples are intact.  Ext: SCDs BLE. No mjr edema. No cyanosis  Skin: No petechiae / purpura   Problem List:   Principal Problem:   Incarcerated femoral hernia Active Problems:   HYPOTHYROIDISM   HYPERLIPIDEMIA   DEMENTIA   HYPERTENSION   AV BLOCK, COMPLETE   PACEMAKER, PERMANENT   Acute respiratory failure   Aspiration pneumonia   Sepsis   Altered mental status    Results:   Labs: Results for orders placed during the hospital encounter of 08/13/13 (from the past 48 hour(s))  GLUCOSE, CAPILLARY     Status: Abnormal   Collection Time    08/18/13 12:57 PM      Result Value Range   Glucose-Capillary 160 (*) 70 - 99 mg/dL  GLUCOSE, CAPILLARY     Status: Abnormal   Collection Time    08/18/13  5:24 PM      Result Value Range   Glucose-Capillary 157 (*) 70 - 99 mg/dL   Comment 1 Documented in Chart     Comment 2 Notify RN    GLUCOSE, CAPILLARY     Status: Abnormal   Collection Time    08/18/13 11:41 PM      Result Value Range   Glucose-Capillary 187 (*)  70 - 99 mg/dL   Comment 1 Documented in Chart     Comment 2 Notify RN    COMPREHENSIVE METABOLIC PANEL     Status: Abnormal   Collection Time    08/19/13  4:00 AM      Result Value Range   Sodium 131 (*) 135 - 145 mEq/L   Potassium 3.8  3.5 - 5.1 mEq/L   Chloride 98  96 - 112 mEq/L   CO2 24  19 - 32 mEq/L   Glucose, Bld 154 (*) 70 - 99 mg/dL   BUN 19  6 - 23 mg/dL   Creatinine, Ser 4.09  0.50 - 1.10 mg/dL   Calcium 7.5 (*) 8.4 - 10.5 mg/dL   Total Protein 4.8 (*) 6.0 - 8.3 g/dL   Albumin 1.9 (*) 3.5 - 5.2 g/dL   AST 40 (*) 0 - 37 U/L   ALT 66 (*) 0 - 35 U/L   Alkaline Phosphatase 107  39 - 117 U/L   Total Bilirubin 0.2 (*) 0.3 - 1.2 mg/dL   GFR calc non Af Amer 81 (*) >90 mL/min   GFR calc Af  Amer >90  >90 mL/min   Comment: (NOTE)     The eGFR has been calculated using the CKD EPI equation.     This calculation has not been validated in all clinical situations.     eGFR's persistently <90 mL/min signify possible Chronic Kidney     Disease.  MAGNESIUM     Status: None   Collection Time    08/19/13  4:00 AM      Result Value Range   Magnesium 1.9  1.5 - 2.5 mg/dL  PHOSPHORUS     Status: None   Collection Time    08/19/13  4:00 AM      Result Value Range   Phosphorus 2.7  2.3 - 4.6 mg/dL  CBC     Status: Abnormal   Collection Time    08/19/13  4:00 AM      Result Value Range   WBC 9.9  4.0 - 10.5 K/uL   RBC 3.48 (*) 3.87 - 5.11 MIL/uL   Hemoglobin 11.1 (*) 12.0 - 15.0 g/dL   HCT 40.9 (*) 81.1 - 91.4 %   MCV 92.5  78.0 - 100.0 fL   MCH 31.9  26.0 - 34.0 pg   MCHC 34.5  30.0 - 36.0 g/dL   RDW 78.2  95.6 - 21.3 %   Platelets 255  150 - 400 K/uL  DIFFERENTIAL     Status: Abnormal   Collection Time    08/19/13  4:00 AM      Result Value Range   Neutrophils Relative % 68  43 - 77 %   Neutro Abs 6.8  1.7 - 7.7 K/uL   Lymphocytes Relative 13  12 - 46 %   Lymphs Abs 1.3  0.7 - 4.0 K/uL   Monocytes Relative 14 (*) 3 - 12 %   Monocytes Absolute 1.4 (*) 0.1 - 1.0 K/uL    Eosinophils Relative 4  0 - 5 %   Eosinophils Absolute 0.4  0.0 - 0.7 K/uL   Basophils Relative 0  0 - 1 %   Basophils Absolute 0.0  0.0 - 0.1 K/uL  PREALBUMIN     Status: Abnormal   Collection Time    08/19/13  4:00 AM      Result Value Range   Prealbumin 15.1 (*) 17.0 - 34.0 mg/dL   Comment: Performed at [facility]  TRIGLYCERIDES     Status: None   Collection Time    08/19/13  4:00 AM      Result Value Range   Triglycerides 136  <150 mg/dL  GLUCOSE, CAPILLARY     Status: Abnormal   Collection Time    08/19/13 11:12 AM      Result Value Range   Glucose-Capillary 170 (*) 70 - 99 mg/dL   Comment 1 Notify RN    GLUCOSE, CAPILLARY     Status: Abnormal   Collection Time    08/19/13  5:35 PM      Result Value Range   Glucose-Capillary 174 (*) 70 - 99 mg/dL   Comment 1 Notify RN    GLUCOSE, CAPILLARY     Status: Abnormal   Collection Time    08/19/13 11:52 PM      Result Value Range   Glucose-Capillary 121 (*) 70 - 99 mg/dL    Imaging / Studies: No results found.  Medications / Allergies: per chart  Antibiotics: Anti-infectives   Start     Dose/Rate Route Frequency Ordered Stop   08/15/13 1000  vancomycin (VANCOCIN) IVPB 1000 mg/200 mL premix  Status:  Discontinued     1,000 mg 200 mL/hr over 60 Minutes Intravenous Every 24 hours 08/15/13 0853 08/17/13 0809   08/15/13 1000  ceFEPIme (MAXIPIME) 1 g in dextrose 5 % 50 mL IVPB     1 g 100 mL/hr over 30 Minutes Intravenous Every 24 hours 08/15/13 0856     08/14/13 0031  piperacillin-tazobactam (ZOSYN) IVPB 3.375 g  Status:  Discontinued     3.375 g 12.5 mL/hr over 240 Minutes Intravenous 3 times per day 08/14/13 0031 08/15/13 0739   08/13/13 1715  piperacillin-tazobactam (ZOSYN) IVPB 3.375 g  Status:  Discontinued     3.375 g 12.5 mL/hr over 240 Minutes Intravenous 3 times per day 08/13/13 1700 08/15/13 0852        Assessment/Plan  Hx CVA  -resume aggrenox tomorrow Hypothyroidism  Resume home  levothyroxine  CAD, CHF, AVB s/p pacemaker -stable  Hypertension  -resume losartan today.  Hold off on amlodipine for now Dementia, mild  -resume aricept Hypokalemia  -resolved  Aspiration PNA  -pulmonary toilet -cefepime and Vanc D6/7 Sepsis  -resolved VDRF  -resolved  High grade small bowel obstruction secondary to femoral hernia inguinal hernia  -s/p primary repair of right femoral hernia with JP drain placement Dr. Derrell Lolling 08/13/13  -POD #7  -full liquids for breakfast, if tolerates advance to soft diet for lunch. Add ensure -wean TPN and then DC central line after a peripheral line is established.   -IS , encourage use  -PT eval and treat  -Zosyn changed to cefepime and Vanc day #6/7 -VTE prophylaxis, lovenox, mobilize  Dispo-transfer to floor.  Family wishes for the patient to go to [facility].  SW on board  Ashok Norris, [facility] Pager [phone] Office [phone]  08/20/2013 7:36 AM

## 2013-08-20 NOTE — Progress Notes (Signed)
PARENTERAL NUTRITION CONSULT NOTE - FOLLOW UP  Pharmacy Consult:  TPN Indication:  Prolonged ileus  Allergies  Allergen Reactions  . Codeine     unknown    Patient Measurements: Height: 5\' 4"  (162.6 cm) Weight: 137 lb 9.1 oz (62.4 kg) IBW/kg (Calculated) : 54.7  Vital Signs: Temp: 99.2 F (37.3 C) (11/25 0400) BP: 154/56 mmHg (11/25 0700) Pulse Rate: 76 (11/25 0700) Intake/Output from previous day: 11/24 0701 - 11/25 0700 In: 2613.1 [P.O.:60; I.V.:480; IV Piggyback:50; TPN:2023.1] Out: 400 [Urine:400]  Labs:  Recent Labs  08/18/13 0430 08/19/13 0400  WBC 9.8 9.9  HGB 11.2* 11.1*  HCT 33.1* 32.2*  PLT 276 255     Recent Labs  08/18/13 0430 08/19/13 0400  NA 132* 131*  K 3.9 3.8  CL 100 98  CO2 25 24  GLUCOSE 157* 154*  BUN 19 19  CREATININE 0.53 0.56  CALCIUM 7.6* 7.5*  MG 1.9 1.9  PHOS 2.7 2.7  PROT  --  4.8*  ALBUMIN  --  1.9*  AST  --  40*  ALT  --  66*  ALKPHOS  --  107  BILITOT  --  0.2*  PREALBUMIN  --  15.1*  TRIG  --  136   Estimated Creatinine Clearance: 42 ml/min (by C-G formula based on Cr of 0.56).    Recent Labs  08/19/13 1112 08/19/13 1735 08/19/13 2352  GLUCAP 170* 174* 121*      Insulin Requirements in the past 24 hours:  7 units Novolog SSI  Assessment: 43 YOF who developed bowel obstruction from a right-sided incarcerated hernia and vomited feculent material and aspirated.  Patient was taken to the OR on 08/13/13 for hernia repair.  She continues on TPN for prolonged ileus.  GI: hx diverticulosis - scheduled Zofran, PPI IV, TG WNL, NGT d/c 11/23.  Prealbumin remains low at 15.1 but trending up. Pt on clear liquid diet. Pt vomited x 1 last night. Plan to advance to full liquids for breakfast and then to soft diet for lunch. Weaning TPN. Endo: hx hypothyroidism on IV Synthroid.  No hx DM - CBGs at goal. Lytes: K+ 3.8 (goal ~4 for ileus), hyponatremia, others WNL Renal: SCr stable, CrCL ~42 ml/min, I/O not charted  accurately. NS with K[facility] at [facility] Pulm: RA. Cards: hx HLD / HTN / CHF (EF 55-60%) / pacemaker / CAD - BP labile. HR 60s, AV-paced. Losartan resumed 11/24.  Hepatobil: LFTs/tbili WNL Neuro: hx CVA / dementia / TIA - GCS 15 (Aricept, Aggrenox PTA) ID: Cefepime D#6 (11/20 >> ) for asp PNA, afebrile, WBC WNL, +SA PCR, all cx negative thus far, s/p 3d vanc Best Practices: Lovenox, MC, CHG/mupirocin, PPI IV TPN Access: right internal jugular triple lumen placed 11/20 TPN day#: 5 (11/20 >> )  Current Nutrition:  Clinimix E 5/15 at 40 ml/hr + lipids at 10 ml/hr   Nutritional Goals:  1650-1800 kCal, 85-100 grams of protein per day  Plan:  - Wean TPN to off tonight when current bag ends - Will d/c TPN labs, TPN nursing orders - Will change IV protonix to po  Christoper Fabian, PharmD, BCPS Clinical pharmacist, pager [phone] 08/20/2013, 7:37 AM

## 2013-08-20 NOTE — Progress Notes (Signed)
Patient was transferred from Unit 2S to 6N room 30, after report was called to receiving RN, Mardella Layman.  Patient's medications, chart, and personal belongings were all sent with patient.  Patient's son called and informed of transfer and new room location.  Patient transported to 6N without any complications.    Keitha Butte, RN

## 2013-08-20 NOTE — Progress Notes (Signed)
Physical Therapy Treatment Patient Details Name: [name] MRN: [id number] DOB: 1921-12-25 Today's Date: 08/20/2013 Time: 4098-1191 PT Time Calculation (min): 28 min  PT Assessment / Plan / Recommendation  History of Present Illness Patient admitted with N&V underwent small bowel resection and right femoral hernia repair.  s/p left hip hemiarthroplasty 5 weeks ago due to fall with femoral fracture.  She was at [facility] for rehab and prior to fall lived at [facility].   PT Comments   Pt less confused today, although labile. Continues to make slow, steady progress. Will need continued repetition of instruction as her confusion improves to increase possibility that she will recall hip precautions and safe ways to mobilize.   Follow Up Recommendations  SNF     Does the patient have the potential to tolerate intense rehabilitation     Barriers to Discharge        Equipment Recommendations  None recommended by PT    Recommendations for Other Services    Frequency Min 2X/week   Progress towards PT Goals Progress towards PT goals: Progressing toward goals  Plan Current plan remains appropriate    Precautions / Restrictions Precautions Precautions: Fall;Posterior Hip   Pertinent Vitals/Pain Denied pain at rest; +abdominal pain with movement (not rated due to AMS)    Mobility  Bed Mobility Bed Mobility: Supine to Sit;Sitting - Scoot to Edge of Bed Supine to Sit: 1: +2 Total assist;HOB flat Supine to Sit: Patient Percentage: 50% Sitting - Scoot to Edge of Bed: 3: Mod assist Details for Bed Mobility Assistance: vc for sequencing to maintain hip precautions; assist to raise torso and prevent excessive hip flexion as coming up to EOB Transfers Transfers: Sit to Stand;Stand to Sit Sit to Stand: 3: Mod assist Stand to Sit: 3: Mod assist Details for Transfer Assistance: vc for sequencing; assist for vertical rise while preventing excessive hip  flexion Ambulation/Gait Ambulation/Gait Assistance: 4: Min assist;Other (comment) (+1 for lines) Ambulation Distance (Feet): 120 Feet Assistive device: Rolling walker Gait Pattern: Step-through pattern;Decreased stride length;Trunk flexed    Exercises     PT Diagnosis:    PT Problem List:   PT Treatment Interventions:     PT Goals (current goals can now be found in the care plan section)    Visit Information  Last PT Received On: 08/20/13 Assistance Needed: +2 (for lines/chair) History of Present Illness: Patient admitted with N&V underwent small bowel resection and right femoral hernia repair.  s/p left hip hemiarthroplasty 5 weeks ago due to fall with femoral fracture.  She was at [facility] for rehab and prior to fall lived at [facility].    Subjective Data  Subjective: "I'll do what you ask me to. "   Cognition  Cognition Arousal/Alertness: Awake/alert Behavior During Therapy: WFL for tasks assessed/performed Overall Cognitive Status: No family/caregiver present to determine baseline cognitive functioning Orientation Level: Situation Memory: Decreased recall of precautions    Balance     End of Session PT - End of Session Equipment Utilized During Treatment: Gait belt Activity Tolerance: Patient limited by fatigue Patient left: with nursing/sitter in room;Other (comment) (in w/c to transport to new room) Nurse Communication: Mobility status   GP     [name] 08/20/2013, 3:42 PM Pager [phone]

## 2013-08-20 NOTE — Clinical Documentation Improvement (Signed)
THIS DOCUMENT IS NOT A PERMANENT PART OF THE MEDICAL RECORD  Please update your documentation with the medical record to reflect your response to this query. If you need help knowing how to do this please call [phone].  08/20/13   Dear Ashok Norris / Associates,  In a better effort to capture your patient's severity of illness, reflect appropriate length of stay and utilization of resources, a review of the patient medical record has revealed the following indicators.    Based on your clinical judgment, please clarify and document in a progress note and/or discharge summary the clinical condition associated with the following supporting information:  In responding to this query please exercise your independent judgment.  The fact that a query is asked, does not imply that any particular answer is desired or expected.  Possible Clinical Conditions?  _______Mild Malnutrition  _______Moderate Malnutrition _______Severe Malnutrition   _______Protein Calorie Malnutrition ____X__Severe Protein Calorie Malnutrition-added to discharge summary _______Emaciation  _______Cachexia   _______Other Condition _______Cannot clinically determine     Signs & Symptoms: Ht: 5'4"     Wt: 137 lb 9.1 oz    Nutrition Consult: 08/20/13: Inadequate oral intake related to altered GI function as evidenced by NPO status. Pharmacy note indicates plan to wean patient of TPN tonight when current bag ends.   You may use possible, probable, or suspect with inpatient documentation. possible, probable, suspected diagnoses MUST be documented at the time of discharge  Reviewed: additional documentation in the medical record  Thank You,  Marciano Sequin, Clinical Documentation Specialist: [phone] Health Information Management [place]

## 2013-08-20 NOTE — Progress Notes (Signed)
Patient transferred to 6N. CSW referred this on to the unit social worker on 6N who will follow up with patient and patient's son to provide bed offers for SNF. This Child psychotherapist signing off.  Maree Krabbe, MSW, [facility]res[facility] Majors [phone]

## 2013-08-20 NOTE — Progress Notes (Signed)
CSW reviewed chart and noticed the plan is for possible LTAC. CSW standing by at this time for possible LTAC vs SNF.  Maree Krabbe, MSW, [facility] Majors [phone]

## 2013-08-21 LAB — GLUCOSE, CAPILLARY
Glucose-Capillary: 91 mg/dL (ref 70–99)
Glucose-Capillary: 141 mg/dL — ABNORMAL HIGH (ref 70–99)

## 2013-08-21 LAB — CULTURE, BLOOD (ROUTINE X 2): Culture: NO GROWTH

## 2013-08-21 NOTE — Clinical Social Work Note (Signed)
CSW received handoff from previous CSW working with pt. CSW met with pt at bedside to provide current bed offers for SNF placement. Pt was upset stating that she was "doing the best she could," and requested that CSW contact pt's son Melynda Keller, [phone]) or her daughter-in-law Lurena Joiner [phone]) to pick a SNF placement. CSW provided emotional support to pt at bedside. CSW attempted to contact pt's son, but left a message. CSW spoke to pt's daughter-in-law regarding SNF placement. Lurena Joiner stated that family would not like for pt to return to [facility] today, due to past experiences. Per Lurena Joiner, she will be touring [facility] and [facility] today and calling CSW back with updates. Lurena Joiner also requested that CSW inform MD that pt's son, Melynda Keller, is requesting to speak with MD prior to discharge. CSW followed-up with pt's RN who is to contact MD and inform MD of pt's son request. CSW to continue to follow and assist with discharge planning needs.  Darlyn Chamber, LCSWA Clinical Social Worker [phone]

## 2013-08-21 NOTE — Progress Notes (Signed)
The patient is stable objectively, and subjectively she is not complaining of any significant problems.  Sometimes it is difficult to determine what is really going on with this patient because of her aphasia.  She can be transferred to a skilled nursing facility at anytime.  Marta Lamas. Gae Bon, MD, FACS [phone] [facility]

## 2013-08-21 NOTE — Progress Notes (Signed)
ANTIBIOTIC CONSULT NOTE - FOLLOW UP  Pharmacy Consult for Cefepime Indication: pneumonia  Allergies  Allergen Reactions  . Codeine     unknown    Patient Measurements: Height: 5\' 4"  (162.6 cm) Weight: 125 lb (56.7 kg) IBW/kg (Calculated) : 54.7  Vital Signs: Temp: 98.5 F (36.9 C) (11/26 0552) Temp src: Oral (11/26 0552) BP: 143/53 mmHg (11/26 0552) Pulse Rate: 61 (11/26 0552) Intake/Output from previous day: 11/25 0701 - 11/26 0700 In: 1256.7 [P.O.:600; I.V.:456.7; IV Piggyback:50; TPN:150] Out: -  Intake/Output from this shift:    Labs:  Recent Labs  08/19/13 0400  WBC 9.9  HGB 11.1*  PLT 255  CREATININE 0.56   Estimated Creatinine Clearance: 42 ml/min (by C-G formula based on Cr of 0.56). No results found for this basename: VANCOTROUGH, Leodis Binet, VANCORANDOM, GENTTROUGH, GENTPEAK, GENTRANDOM, TOBRATROUGH, TOBRAPEAK, TOBRARND, AMIKACINPEAK, AMIKACINTROU, AMIKACIN,  in the last 72 hours   Microbiology: Recent Results (from the past 720 hour(s))  SURGICAL PCR SCREEN     Status: Abnormal   Collection Time    08/13/13  6:52 PM      Result Value Range Status   MRSA, PCR NEGATIVE  NEGATIVE Final   Staphylococcus aureus POSITIVE (*) NEGATIVE Final   Comment:            The Xpert SA Assay (FDA     approved for NASAL specimens     in patients over 58 years of age),     is one component of     a comprehensive surveillance     program.  Test performance has     been validated by [employer] for patients greater     than or equal to 61 year old.     It is not intended     to diagnose infection nor to     guide or monitor treatment.  CULTURE, RESPIRATORY (NON-EXPECTORATED)     Status: None   Collection Time    08/15/13 11:40 AM      Result Value Range Status   Specimen Description TRACHEAL ASPIRATE   Final   Special Requests NONE   Final   Gram Stain     Final   Value: FEW WBC PRESENT,BOTH PMN AND MONONUCLEAR     RARE SQUAMOUS EPITHELIAL CELLS PRESENT      NO ORGANISMS SEEN     Performed at [facility]   Culture     Final   Value: NO GROWTH 2 DAYS     Performed at [facility]   Report Status 08/17/2013 FINAL   Final  CULTURE, BLOOD (ROUTINE X 2)     Status: None   Collection Time    08/15/13  1:05 PM      Result Value Range Status   Specimen Description BLOOD LEFT HAND   Final   Special Requests BOTTLES DRAWN AEROBIC ONLY 8CC   Final   Culture  Setup Time     Final   Value: 08/15/2013 17:30     Performed at [facility]   Culture     Final   Value:        BLOOD CULTURE RECEIVED NO GROWTH TO DATE CULTURE WILL BE HELD FOR 5 DAYS BEFORE ISSUING A FINAL NEGATIVE REPORT     Performed at [facility]   Report Status PENDING   Incomplete  CULTURE, BLOOD (ROUTINE X 2)     Status: None   Collection Time  08/15/13  1:15 PM      Result Value Range Status   Specimen Description BLOOD RIGHT ARM   Final   Special Requests BOTTLES DRAWN AEROBIC AND ANAEROBIC 10CC   Final   Culture  Setup Time     Final   Value: 08/15/2013 17:30     Performed at [facility]   Culture     Final   Value:        BLOOD CULTURE RECEIVED NO GROWTH TO DATE CULTURE WILL BE HELD FOR 5 DAYS BEFORE ISSUING A FINAL NEGATIVE REPORT     Performed at [facility]   Report Status PENDING   Incomplete  URINE CULTURE     Status: None   Collection Time    08/15/13  1:35 PM      Result Value Range Status   Specimen Description URINE, RANDOM   Final   Special Requests NONE   Final   Culture  Setup Time     Final   Value: 08/15/2013 21:01     Performed at [facility] Count     Final   Value: NO GROWTH     Performed at [facility]   Culture     Final   Value: NO GROWTH     Performed at [facility]   Report Status 08/16/2013 FINAL   Final    Anti-infectives   Start     Dose/Rate Route Frequency Ordered Stop   08/15/13 1000  vancomycin (VANCOCIN) IVPB 1000 mg/200 mL premix  Status:   Discontinued     1,000 mg 200 mL/hr over 60 Minutes Intravenous Every 24 hours 08/15/13 0853 08/17/13 0809   08/15/13 1000  ceFEPIme (MAXIPIME) 1 g in dextrose 5 % 50 mL IVPB     1 g 100 mL/hr over 30 Minutes Intravenous Every 24 hours 08/15/13 0856     08/14/13 0031  piperacillin-tazobactam (ZOSYN) IVPB 3.375 g  Status:  Discontinued     3.375 g 12.5 mL/hr over 240 Minutes Intravenous 3 times per day 08/14/13 0031 08/15/13 0739   08/13/13 1715  piperacillin-tazobactam (ZOSYN) IVPB 3.375 g  Status:  Discontinued     3.375 g 12.5 mL/hr over 240 Minutes Intravenous 3 times per day 08/13/13 1700 08/15/13 0852      Assessment: Pt on Cefepime Day #7 for HCAP/asp pna. Afebrile. WBC WNL. Renal function remains stable.  Zosyn 11/18>>11/20 Cefepime 11/20>> Vanc 11/20>> 11/22  11/20 blood x 2 - NGTD 11/20 trach asp - NEG 11/20 Urine - NEG  Goal of Therapy:  Eradication of infection  Plan:  1. Continue cefepime 1gm IV Q24H. 2.  F/u renal function, micro data, clinical status and  3. Consider 7-8 day course for HCAP  Christoper Fabian, PharmD, BCPS Clinical pharmacist, pager [phone] 08/21/2013,8:37 AM

## 2013-08-21 NOTE — Progress Notes (Signed)
RN received tele notification for "V-Tach" at 0500.  Upon assessment patient was arousable, oriented, and asymptomatic.  At 0515 RN received phone call from central tele stating that patient had "5 beat run of V-Tach."  Patient assessed again and was alert, oriented, and denied any SOB, pain, or abnormal feelings.  HR returned to normal rate and rhythm with no further episodes of V-Tach.  Will continue to monitor.

## 2013-08-21 NOTE — Progress Notes (Signed)
Stable for transfer to SNF.    Marta Lamas. Gae Bon, MD, FACS [phone] [facility]

## 2013-08-21 NOTE — Progress Notes (Signed)
Patient ID: [name], female   DOB: 06-02-1922, 79 y.o.   MRN: [id number] 8 Days Post-Op  Subjective: Pt feels well today.  No c/o.  Says she is mobilizing with RNs and therapies.  Eating some, drinking ensure  Objective: Vital signs in last 24 hours: Temp:  [97.9 F (36.6 C)-98.6 F (37 C)] 98.5 F (36.9 C) (11/26 0552) Pulse Rate:  [61-89] 61 (11/26 0552) Resp:  [16-33] 17 (11/26 0552) BP: (102-143)/(53-71) 143/53 mmHg (11/26 0552) SpO2:  [92 %-98 %] 96 % (11/26 0552) Weight:  [125 lb (56.7 kg)] 125 lb (56.7 kg) (11/25 1515) Last BM Date: 08/20/13  Intake/Output from previous day: 11/25 0701 - 11/26 0700 In: 1256.7 [P.O.:600; I.V.:456.7; IV Piggyback:50; TPN:150] Out: -  Intake/Output this shift:    PE: Abd: soft, appropriately tender, incision, c/d/i with staples in place, +BS Heart: regular Lungs: CTAB, O2 in place at 2L  Lab Results:   Recent Labs  08/19/13 0400  WBC 9.9  HGB 11.1*  HCT 32.2*  PLT 255   BMET  Recent Labs  08/19/13 0400  NA 131*  K 3.8  CL 98  CO2 24  GLUCOSE 154*  BUN 19  CREATININE 0.56  CALCIUM 7.5*   PT/INR No results found for this basename: LABPROT, INR,  in the last 72 hours CMP     Component Value Date/Time   NA 131* 08/19/2013 0400   K 3.8 08/19/2013 0400   CL 98 08/19/2013 0400   CO2 24 08/19/2013 0400   GLUCOSE 154* 08/19/2013 0400   BUN 19 08/19/2013 0400   CREATININE 0.56 08/19/2013 0400   CALCIUM 7.5* 08/19/2013 0400   PROT 4.8* 08/19/2013 0400   ALBUMIN 1.9* 08/19/2013 0400   AST 40* 08/19/2013 0400   ALT 66* 08/19/2013 0400   ALKPHOS 107 08/19/2013 0400   BILITOT 0.2* 08/19/2013 0400   GFRNONAA 81* 08/19/2013 0400   GFRAA >90 08/19/2013 0400   Lipase     Component Value Date/Time   LIPASE 16 08/13/2013 1155       Studies/Results: No results found.  Anti-infectives: Anti-infectives   Start     Dose/Rate Route Frequency Ordered Stop   08/15/13 1000  vancomycin (VANCOCIN) IVPB 1000  mg/200 mL premix  Status:  Discontinued     1,000 mg 200 mL/hr over 60 Minutes Intravenous Every 24 hours 08/15/13 0853 08/17/13 0809   08/15/13 1000  ceFEPIme (MAXIPIME) 1 g in dextrose 5 % 50 mL IVPB     1 g 100 mL/hr over 30 Minutes Intravenous Every 24 hours 08/15/13 0856     08/14/13 0031  piperacillin-tazobactam (ZOSYN) IVPB 3.375 g  Status:  Discontinued     3.375 g 12.5 mL/hr over 240 Minutes Intravenous 3 times per day 08/14/13 0031 08/15/13 0739   08/13/13 1715  piperacillin-tazobactam (ZOSYN) IVPB 3.375 g  Status:  Discontinued     3.375 g 12.5 mL/hr over 240 Minutes Intravenous 3 times per day 08/13/13 1700 08/15/13 0852       Assessment/Plan  Hx CVA  Hypothyroidism  CAD, CHF, AVB s/p pacemaker  Hypertension  Dementia, mild  Hypokalemia  Aspiration PNA  Sepsis  VDRF  High grade small bowel obstruction secondary to femoral hernia inguinal hernia  -s/p primary repair of right femoral hernia with JP drain placement Dr. Derrell Lolling 08/13/13  -POD #8  Plan: 1. JP drain removed 2. Will dc abx therapy today as she has completed her full course for asp PNA 3. PT has recommended  SNF and beds are being looked at 4. Wean O2 to room air 5. Patient is eating some food, but taking ensure for supplementation as well. 6. If bed available, suspect patient is ready for dc to SNF.   LOS: 8 days    Zetta Stoneman E 08/21/2013, 10:21 AM Pager: [phone]

## 2013-08-21 NOTE — Progress Notes (Signed)
Okay to go to SNF.  Clova Morlock O. Sarajean Dessert, III, MD, FACS [phone]--pager [phone]--office [facility]  

## 2013-08-22 LAB — GLUCOSE, CAPILLARY: Glucose-Capillary: 135 mg/dL — ABNORMAL HIGH (ref 70–99)

## 2013-08-22 NOTE — Progress Notes (Signed)
9 Days Post-Op  Subjective: Stable and alert. Pleasant. No distress. Denies pain. Tolerating diet. Having stools. Mild excision noted. She states she is looking forward to going to [facility] tomorrow.  Objective: Vital signs in last 24 hours: Temp:  [98.2 F (36.8 C)-98.5 F (36.9 C)] 98.2 F (36.8 C) (11/27 0546) Pulse Rate:  [63-72] 70 (11/27 0546) Resp:  [17-18] 17 (11/27 0546) BP: (123-145)/(55-60) 145/59 mmHg (11/27 0546) SpO2:  [95 %-96 %] 95 % (11/27 0546) Last BM Date: 08/20/13  Intake/Output from previous day: 11/26 0701 - 11/27 0700 In: 720 [P.O.:720] Out: 100 [Urine:100] Intake/Output this shift: Total I/O In: -  Out: 100 [Urine:100]  General appearance: elderly. Alert. Pleasant. Mild expressive aphasia. GI: abdomen soft. Nontender. Right inguinal incision looks good. No hematoma or infection.  Lab Results:  Results for orders placed during the hospital encounter of 08/13/13 (from the past 24 hour(s))  GLUCOSE, CAPILLARY     Status: None   Collection Time    08/21/13  8:06 AM      Result Value Range   Glucose-Capillary 91  70 - 99 mg/dL  GLUCOSE, CAPILLARY     Status: Abnormal   Collection Time    08/21/13 12:16 PM      Result Value Range   Glucose-Capillary 155 (*) 70 - 99 mg/dL  GLUCOSE, CAPILLARY     Status: Abnormal   Collection Time    08/21/13  5:12 PM      Result Value Range   Glucose-Capillary 141 (*) 70 - 99 mg/dL  GLUCOSE, CAPILLARY     Status: Abnormal   Collection Time    08/21/13 10:34 PM      Result Value Range   Glucose-Capillary 125 (*) 70 - 99 mg/dL     Studies/Results: @RISRSLT24 @  . docusate sodium  100 mg Oral BID  . donepezil  10 mg Oral Daily  . enoxaparin (LOVENOX) injection  40 mg Subcutaneous Q24H  . feeding supplement (ENSURE COMPLETE)  237 mL Oral BID BM  . insulin aspart  0-15 Units Subcutaneous TID WC  . levothyroxine  137 mcg Oral QAC breakfast  . losartan  100 mg Oral Daily  . ondansetron  4 mg Intravenous  Q6H  . pantoprazole  40 mg Oral Daily  . sodium chloride  10-40 mL Intracatheter Q12H     Assessment/Plan: s/p Procedure(s): HERNIA REPAIR INGUINAL INCARCERATED  Incarcerated femoral hernia with small bowel structuring, status post primary repair with JP drain placement 08/13/2013. Satisfactory progress.Drain out.  She should be stable for transfer to [facility] tomorrow. Continue PT  History CVA CAD, CHF, AVB status post pacemaker Hypertension Dementia, mild Aspiration pneumonia -Antibiotics discontinued yesterday  VDRF Hypothyroidism  @PROBHOSP @  LOS: 9 days    Estephanie Hubbs M 08/22/2013  . .prob

## 2013-08-23 DIAGNOSIS — R062 Wheezing: Secondary | ICD-10-CM | POA: Diagnosis not present

## 2013-08-23 DIAGNOSIS — E876 Hypokalemia: Secondary | ICD-10-CM | POA: Diagnosis not present

## 2013-08-23 DIAGNOSIS — D649 Anemia, unspecified: Secondary | ICD-10-CM | POA: Diagnosis not present

## 2013-08-23 DIAGNOSIS — D72828 Other elevated white blood cell count: Secondary | ICD-10-CM | POA: Diagnosis not present

## 2013-08-23 DIAGNOSIS — J189 Pneumonia, unspecified organism: Secondary | ICD-10-CM | POA: Diagnosis not present

## 2013-08-23 DIAGNOSIS — R279 Unspecified lack of coordination: Secondary | ICD-10-CM | POA: Diagnosis not present

## 2013-08-23 DIAGNOSIS — R059 Cough, unspecified: Secondary | ICD-10-CM | POA: Diagnosis not present

## 2013-08-23 DIAGNOSIS — E039 Hypothyroidism, unspecified: Secondary | ICD-10-CM | POA: Diagnosis not present

## 2013-08-23 DIAGNOSIS — F0390 Unspecified dementia without behavioral disturbance: Secondary | ICD-10-CM | POA: Diagnosis not present

## 2013-08-23 DIAGNOSIS — R0602 Shortness of breath: Secondary | ICD-10-CM | POA: Diagnosis not present

## 2013-08-23 DIAGNOSIS — K56609 Unspecified intestinal obstruction, unspecified as to partial versus complete obstruction: Secondary | ICD-10-CM | POA: Diagnosis not present

## 2013-08-23 DIAGNOSIS — J9610 Chronic respiratory failure, unspecified whether with hypoxia or hypercapnia: Secondary | ICD-10-CM | POA: Diagnosis not present

## 2013-08-23 DIAGNOSIS — I4430 Unspecified atrioventricular block: Secondary | ICD-10-CM | POA: Diagnosis not present

## 2013-08-23 DIAGNOSIS — I2510 Atherosclerotic heart disease of native coronary artery without angina pectoris: Secondary | ICD-10-CM | POA: Diagnosis not present

## 2013-08-23 DIAGNOSIS — F0280 Dementia in other diseases classified elsewhere without behavioral disturbance: Secondary | ICD-10-CM | POA: Diagnosis not present

## 2013-08-23 DIAGNOSIS — J690 Pneumonitis due to inhalation of food and vomit: Secondary | ICD-10-CM | POA: Diagnosis not present

## 2013-08-23 DIAGNOSIS — Z4889 Encounter for other specified surgical aftercare: Secondary | ICD-10-CM | POA: Diagnosis not present

## 2013-08-23 DIAGNOSIS — I10 Essential (primary) hypertension: Secondary | ICD-10-CM | POA: Diagnosis not present

## 2013-08-23 DIAGNOSIS — Z4789 Encounter for other orthopedic aftercare: Secondary | ICD-10-CM | POA: Diagnosis not present

## 2013-08-23 DIAGNOSIS — F411 Generalized anxiety disorder: Secondary | ICD-10-CM | POA: Diagnosis not present

## 2013-08-23 DIAGNOSIS — M6281 Muscle weakness (generalized): Secondary | ICD-10-CM | POA: Diagnosis not present

## 2013-08-23 DIAGNOSIS — R262 Difficulty in walking, not elsewhere classified: Secondary | ICD-10-CM | POA: Diagnosis not present

## 2013-08-23 DIAGNOSIS — Z950 Presence of cardiac pacemaker: Secondary | ICD-10-CM | POA: Diagnosis not present

## 2013-08-23 DIAGNOSIS — R4182 Altered mental status, unspecified: Secondary | ICD-10-CM | POA: Diagnosis not present

## 2013-08-23 LAB — GLUCOSE, CAPILLARY: Glucose-Capillary: 105 mg/dL — ABNORMAL HIGH (ref 70–99)

## 2013-08-23 MED ORDER — TRAMADOL HCL 50 MG PO TABS: 50.0000 mg | ORAL_TABLET | Freq: Four times a day (QID) | ORAL | Status: DC | PRN

## 2013-08-23 MED ORDER — ENSURE COMPLETE PO LIQD: 237.0000 mL | Freq: Two times a day (BID) | ORAL | Status: DC

## 2013-08-23 NOTE — Discharge Summary (Signed)
Agree with discharge plans.  Angelia Mould. Derrell Lolling, M.D., [facility] General and Minimally invasive Surgery Breast and Colorectal Surgery Office:   [phone] Pager:   [phone]

## 2013-08-23 NOTE — Discharge Summary (Addendum)
Physician Discharge Summary  [name] ZOX:[id number] DOB: 1922/01/28 DOA: 08/13/2013  PCP: [name] Barre, MD  Consultation: CCM-dr. Molli [name]   IM-Dr. Catha [name]  Admit date: 08/13/2013 Discharge date: 08/23/2013  Recommendations for Outpatient Follow-up:   Follow-up Information   Follow up with [name] Saver, MD In 2 weeks.   Specialty:  General Surgery   Contact information:   [address] [place] [address] [phone]      Discharge Diagnoses:  1. Sepsis 2. Small bowel obstruction 2/2 incarcerated femoral hernia 3. Hypokalemia 4. VRDF 5. Aspiration pneumonia  6. Hypertension 7. Hypothyroidism 8. CAD/CHF/AVP  9. Hx CVA 10. PCM   Surgical Procedure: Primary tissue repair of a right strangulated femoral hernia, small bowel resection which consisted of approximately 10 cm and anastomosis ([name] 08/13/13)  Discharge Condition: stable Disposition: SNF  Diet recommendation: regular diet  Filed Weights   08/18/13 0500 08/19/13 0600 08/20/13 1515  Weight: 143 lb 1.3 oz (64.9 kg) 137 lb 9.1 oz (62.4 kg) 125 lb (56.7 kg)    Filed Vitals:   08/23/13 0528  BP: 144/62  Pulse: 70  Temp: 98.1 F (36.7 C)  Resp: 16      Hospital Course:  [name] presented to [facility] with nausea vomiting and abdominal pain.  She had a NGT placed to suction.  She was found to have a incarcerated right femoral hernia causing a bowel obstruction.  She underwent repair urgently.  Her course is as follows  Sepsis/hypotension: she was admitted to the ICU.  She was started on antibiotics, IV hydration and subsequently resolved.  Small bowel obstruction 2/2 incarcerated femoral hernia:she underwent urgent surgery.  She was kept NPO, NGT and started on TPN.  Post operatively she developed respiratory failure(see below) which hindered her improvement for some time.  Once she was extubated, she was started on a diet, having bowel movements. Her drain was removed  followed by removal of staples.  She has steristrips to RLQ. She may not lift anything >10 lbs.  She is not allowed to take baths, but may shower.  She will need to follow up with [name] in 2-3 weeks.  She may take tramadol for pain PRN.  She does not have any diet restrictions.   Hypokalemia: this was supplemented and resolved.  VRDF: she developed hypoxemia, she was intubated.  CCM was consulted to help manage the vent.  She was found to have aspiration pneumonia.  She has finished antibiotic treatment.  She was extubated and breathing return to normal.  She did not have further issues throughout the remainder of her hospitalization.   Aspiration pneumonia: finished atbx treatment.  Encourage incentive spirometry.   Hypertension: remained stable, home medication were resumed.   Hypothyroidism: remained stable.   CAD/CHF/AVP: remained stable.  She did not appear to be fluid overloaded.  Did not exhibit chest pains.   HxCVA: aggrenox was held due to surgery.  May be resumed.   PCM: she was started on TPN.  This was weaned off as she resumed diet.   On POD #10 she was ambulating, tolerating a diet, having bowel movements and pain was at a minimal.  She was therefore felt stable for discharge.     Discharge Instructions   Future Appointments Provider Department Dept Phone   08/26/2013 3:50 PM [name] [facility] [place] Office [phone]   09/06/2013 10:45 AM [name] Levins, MD [facility] [phone]       Medication  List         acetaminophen 325 MG tablet  Commonly known as:  TYLENOL  Take 2 tablets (650 mg total) by mouth every 6 (six) hours as needed.     amLODipine 10 MG tablet  Commonly known as:  NORVASC  Take 1 tablet (10 mg total) by mouth daily. Resume in 3 days if SBP > 150     atorvastatin 10 MG tablet  Commonly known as:  LIPITOR  Take 1 tablet (10 mg total) by mouth daily.     CALTRATE 600+D 600-400 MG-UNIT per tablet   Generic drug:  Calcium Carbonate-Vitamin D  Take 1 tablet by mouth daily.     dipyridamole-aspirin 200-25 MG per 12 hr capsule  Commonly known as:  AGGRENOX  Take 1 capsule by mouth 2 (two) times daily.     donepezil 10 MG tablet  Commonly known as:  ARICEPT  Take 1 tablet (10 mg total) by mouth daily.     DSS 100 MG Caps  Take 100 mg by mouth 2 (two) times daily.     feeding supplement (ENSURE COMPLETE) Liqd  Take 237 mLs by mouth 2 (two) times daily between meals.     ferrous sulfate 325 (65 FE) MG tablet  Take 1 tablet (325 mg total) by mouth 3 (three) times daily after meals.     levothyroxine 137 MCG tablet  Commonly known as:  SYNTHROID, LEVOTHROID  Take 137 mcg by mouth daily before breakfast.     losartan 100 MG tablet  Commonly known as:  COZAAR  Take 100 mg by mouth daily.     methocarbamol 500 MG tablet  Commonly known as:  ROBAXIN  Take 1 tablet (500 mg total) by mouth every 6 (six) hours as needed (muscle spasms.).     multivitamin with minerals Tabs tablet  Take 1 tablet by mouth daily. Centrum silver     polyethylene glycol packet  Commonly known as:  MIRALAX / GLYCOLAX  Take 17 g by mouth 2 (two) times daily.     traMADol 50 MG tablet  Commonly known as:  ULTRAM  Take 1-2 tablets (50-100 mg total) by mouth every 6 (six) hours as needed for pain.           Follow-up Information   Follow up with [name] Saver, MD In 2 weeks.   Specialty:  General Surgery   Contact information:   [address] [place] [address] [phone]        The results of significant diagnostics from this hospitalization (including imaging, microbiology, ancillary and laboratory) are listed below for reference.    Significant Diagnostic Studies: Ct Head Wo Contrast  08/15/2013   CLINICAL DATA:  Depressed mental status.  History of stroke.  EXAM: CT HEAD WITHOUT CONTRAST  TECHNIQUE: Contiguous axial images were obtained from the base of the skull  through the vertex without contrast.  COMPARISON:  07/06/2011.  FINDINGS: Atrophy with chronic microvascular ischemic change. Previous left MCA aneurysm clipping with associated left frontal, temporal, and parietal cerebral infarction. Small left basal ganglia lacune. Slight depression frontotemporal craniotomy.  No acute stroke or hemorrhage. No mass lesion, hydrocephalus, or visible extra-axial fluid. Advanced vascular calcification. No sinus or mastoid disease. Previous ocular surgery.  Compared with priors, there has been slight progression of atrophy and chronic microvascular ischemic change.  IMPRESSION: Chronic changes as described. Slight progression of atrophy with chronic microvascular ischemic change. Remote left MCA territory infarction. No acute hemorrhage or  visible acute cerebral infarction.   Electronically Signed   By: Davonna Belling M.D.   On: 08/15/2013 11:12   Ct Angio Chest Pe W/cm &/or Wo Cm  08/15/2013   CLINICAL DATA:  History of left total hip arthroplasty an incarcerated right inguinal hernia repair now presenting with shortness of breath.  EXAM: CT ANGIOGRAPHY CHEST WITH CONTRAST  TECHNIQUE: Multidetector CT imaging of the chest was performed using the standard protocol during bolus administration of intravenous contrast. Multiplanar CT image reconstructions including MIPs were obtained to evaluate the vascular anatomy.  CONTRAST:  150 mm of Omnipaque 350.  COMPARISON:  No priors.  FINDINGS: Comment: This is a limited examination. There was a malfunction of the scan after the initial injection of contrast material such that this scanner did not trigger image acquisition. A 2nd smaller injection of half dose contrast was administered, and images were acquired, however, systemic arterial phase images were acquired (rather than pulmonary arterial phase), limiting assessment of the pulmonary arterial tree.  Mediastinum: Attenuation of the blood pole within the main pulmonary arteries is  approximately 156 HU, severely limiting assessment for pulmonary embolism. With these limitations in mind, no large saddle embolus or definite lobar embolus is identified. Segmental and subsegmental sized emboli cannot be excluded on today's examination. Heart size is mildly enlarged. There is no significant pericardial fluid, thickening or pericardial calcification. There is atherosclerosis of the thoracic aorta, the great vessels of the mediastinum and the coronary arteries, including calcified atherosclerotic plaque in the left main, left anterior descending, left circumflex and right coronary arteries. Extensive mitral annular calcifications. Mild aortic valve calcifications. Left-sided pacemaker device with lead tips terminating in the right atrial appendage and right ventricular apex. No pathologically enlarged mediastinal or hilar lymph nodes. Esophagus is unremarkable in appearance. Nasogastric tube extends into the stomach, but the tip extends below the lower margin of the images. Right internal jugular central venous catheter with tip terminating at the superior cavoatrial junction. The patient is intubated, with the tip of the endotracheal tube lying approximately 2.1 cm above the carina.  Lungs/Pleura: Small bilateral pleural effusions are simple in appearance and layer dependently. Extensive passive atelectasis is noted throughout the lower lobes of the lungs bilaterally. No definite consolidative airspace disease. Focal areas of architectural distortion are noted in the inferior segment of the lingula and medial segment of the right middle lobe, and there is some associated mild cylindrical bronchiectasis in the medial segment of the right middle lobe; these findings are most compatible with areas chronic scarring.  Upper Abdomen: 11 mm simple cyst in the upper pole of the left kidney. Mild intrahepatic biliary ductal dilatation appears unchanged compared to the prior study and is likely chronic in  this patient.  Musculoskeletal: There are no aggressive appearing lytic or blastic lesions noted in the visualized portions of the skeleton.  Review of the MIP images confirms the above findings.  IMPRESSION: 1. Exceedingly limited examination due to suboptimal contrast bolus timing. While there is no central saddle embolus or lobar embolus on today's study, segmental and subsegmental sized emboli cannot be entirely excluded. These findings and limitations were discussed by phone with Dr. Molli [name] by Dr. Llana Aliment on 08/15/2013 at 11:15 a.m. 2. Low lung volumes with extensive passive atelectasis throughout the lower lobes of lungs bilaterally. 3. Small bilateral pleural effusions are simple in appearance and layer dependently. 4. Mild cardiomegaly. 5. Atherosclerosis, including left main and 3 vessel coronary artery disease. 6. Support apparatus and additional incidental findings,  as above.   Electronically Signed   By: Trudie Reed M.D.   On: 08/15/2013 11:32   Ct Abdomen Pelvis W Contrast  08/13/2013   CLINICAL DATA:  Nausea, vomiting  EXAM: CT ABDOMEN AND PELVIS WITH CONTRAST  TECHNIQUE: Multidetector CT imaging of the abdomen and pelvis was performed using the standard protocol following bolus administration of intravenous contrast.  CONTRAST:  80mL OMNIPAQUE IOHEXOL 300 MG/ML  SOLN  COMPARISON:  None.  FINDINGS: The lung bases are clear.  The liver demonstrates no focal abnormality. There is no intrahepatic or extrahepatic biliary ductal dilatation. The gallbladder is surgically absent. The spleen demonstrates no focal abnormality. The adrenal glands and pancreas are normal. There are multiple small hypodensities measuring less than 1 cm bilaterally, left greater than right, which are too small to characterize. The bladder is unremarkable.  There are multiple distended loops of small bowel with multiple air air-fluid levels. The stomach is distended and fluid-filled. There is a right inguinal hernia  containing a dilated afferent loop and a decompressed efferent loop consistent with the site of transition. There is no pneumoperitoneum, pneumatosis, or portal venous gas. There is a small amount of pelvic free fluid. There is no lymphadenopathy.  The abdominal aorta is normal in caliber with atherosclerosis.  There are no lytic or sclerotic osseous lesions. There is a left hip arthroplasty.  IMPRESSION: High-grade small bowel obstruction resulting from a right inguinal hernia.   Electronically Signed   By: Elige Ko   On: 08/13/2013 15:03   Dg Chest Port 1 View  08/18/2013   CLINICAL DATA:  Status post hernia repair.  EXAM: PORTABLE CHEST - 1 VIEW  COMPARISON:  August 16, 2013.  FINDINGS: Endotracheal tube has been removed. Nasogastric tube is seen entering stomach. Right internal jugular catheter line and left-sided pacemaker are unchanged in position. Stable cardiomediastinal silhouette. No pneumothorax is noted. Right lung is clear. Stable left basilar opacity is noted concerning for effusion with possible associated pneumonia or atelectasis.  IMPRESSION: Endotracheal tube has been removed. Stable left basilar opacity as described above.   Electronically Signed   By: Roque Lias M.D.   On: 08/18/2013 07:18   Dg Chest Port 1 View  08/16/2013   CLINICAL DATA:  Endotracheal tube position.  EXAM: PORTABLE CHEST - 1 VIEW  COMPARISON:  08/15/2013 CT and chest x-ray.  FINDINGS: Endotracheal tube tip 2.8 cm above the carina.  Right central line tip mid to distal superior vena cava level. Evaluation limited by overlying structures.  Sequential pacemaker enters from the left.  No gross pneumothorax.  Consolidation lung bases consistent with atelectasis/ infiltrates with small pleural effusions. Please see recent CT report.  Pulmonary vascular congestion most notable centrally.  Cardiomegaly.  Calcified tortuous aorta.  IMPRESSION: Endotracheal tube tip 2.8 cm above the carina.  Please see above.    Electronically Signed   By: Bridgett Larsson M.D.   On: 08/16/2013 07:40   Dg Chest Portable 1 View  08/15/2013   CLINICAL DATA:  ET tube and central line placement.  EXAM: PORTABLE CHEST - 1 VIEW  COMPARISON:  Portable chest earlier in the day.  FINDINGS: Transvenous pacer with atrial and ventricular leads good position. ET tube 38 mm above carina. Central venous catheter tip is overlapped by telemetry leads, pacer wires, and nasogastric tube but appears to lie at at or near the cavoatrial junction. There is no pneumothorax. The heart is enlarged and there persists bibasilar opacities left greater than right. Nasogastric  tube is coiled in the stomach.  IMPRESSION: Satisfactory ETT placement. Central venous catheter tip likely lies at the cavoatrial junction. Attention to location on follow-up. No pneumothorax. Persistent bibasilar opacities left greater than right.   Electronically Signed   By: Davonna Belling M.D.   On: 08/15/2013 10:10   Dg Chest Port 1 View  08/15/2013   CLINICAL DATA:  Hernia repair.  EXAM: PORTABLE CHEST - 1 VIEW  COMPARISON:  08/13/2013.  FINDINGS: NG tube noted coiled over stomach. Cardiac pacer with stable lead positioning noted. Mediastinum and hilar structures are normal. Interim development of right and left lower lobe infiltrates are noted. Interim development of a prominent area of subsegmental atelectasis right mid lung field noted. Heart size and pulmonary vascularity normal. No significant pleural effusion or pneumothorax. No acute osseous abnormality.  IMPRESSION: 1. Interim development of bibasilar pulmonary infiltrates suggesting pneumonia. Interim development of prominent subsegmental atelectasis right mid lung field. 2. Cardiac pacer.  Cardiovascular structures stable. 3. NG tube noted projected over stomach.   Electronically Signed   By: Maisie Fus  Register   On: 08/15/2013 07:30   Dg Chest Portable 1 View  08/13/2013   CLINICAL DATA:  Nausea, vomiting  EXAM: PORTABLE CHEST  - 1 VIEW  COMPARISON:  07/01/2013  FINDINGS: Dual lead left-sided pacer in place. Nasogastric tube is appropriately positioned. Heart size is normal. No focal pulmonary opacity.  IMPRESSION: No acute cardiopulmonary process. Nasogastric tube appropriately positioned.   Electronically Signed   By: Christiana Pellant M.D.   On: 08/13/2013 18:29    Microbiology: Recent Results (from the past 240 hour(s))  SURGICAL PCR SCREEN     Status: Abnormal   Collection Time    08/13/13  6:52 PM      Result Value Range Status   MRSA, PCR NEGATIVE  NEGATIVE Final   Staphylococcus aureus POSITIVE (*) NEGATIVE Final   Comment:            The Xpert SA Assay (FDA     approved for NASAL specimens     in patients over 17 years of age),     is one component of     a comprehensive surveillance     program.  Test performance has     been validated by [employer] [facility] for patients greater     than or equal to 76 year old.     It is not intended     to diagnose infection nor to     guide or monitor treatment.  CULTURE, RESPIRATORY (NON-EXPECTORATED)     Status: None   Collection Time    08/15/13 11:40 AM      Result Value Range Status   Specimen Description TRACHEAL ASPIRATE   Final   Special Requests NONE   Final   Gram Stain     Final   Value: FEW WBC PRESENT,BOTH PMN AND MONONUCLEAR     RARE SQUAMOUS EPITHELIAL CELLS PRESENT     NO ORGANISMS SEEN     Performed at [facility]   Culture     Final   Value: NO GROWTH 2 DAYS     Performed at [facility]   Report Status 08/17/2013 FINAL   Final  CULTURE, BLOOD (ROUTINE X 2)     Status: None   Collection Time    08/15/13  1:05 PM      Result Value Range Status   Specimen Description BLOOD LEFT HAND   Final  Special Requests BOTTLES DRAWN AEROBIC ONLY 8CC   Final   Culture  Setup Time     Final   Value: 08/15/2013 17:30     Performed at [facility]   Culture     Final   Value: NO GROWTH 5 DAYS     Performed at [facility]   Report Status 08/21/2013 FINAL   Final  CULTURE, BLOOD (ROUTINE X 2)     Status: None   Collection Time    08/15/13  1:15 PM      Result Value Range Status   Specimen Description BLOOD RIGHT ARM   Final   Special Requests BOTTLES DRAWN AEROBIC AND ANAEROBIC 10CC   Final   Culture  Setup Time     Final   Value: 08/15/2013 17:30     Performed at [facility]   Culture     Final   Value: NO GROWTH 5 DAYS     Performed at [facility]   Report Status 08/21/2013 FINAL   Final  URINE CULTURE     Status: None   Collection Time    08/15/13  1:35 PM      Result Value Range Status   Specimen Description URINE, RANDOM   Final   Special Requests NONE   Final   Culture  Setup Time     Final   Value: 08/15/2013 21:01     Performed at [facility] Count     Final   Value: NO GROWTH     Performed at [facility]   Culture     Final   Value: NO GROWTH     Performed at [facility]   Report Status 08/16/2013 FINAL   Final     Labs: Basic Metabolic Panel:  Recent Labs Lab 08/16/13 2000 08/17/13 0422 08/18/13 0430 08/19/13 0400  NA  --  138 132* 131*  K  --  3.0* 3.9 3.8  CL  --  104 100 98  CO2  --  27 25 24   GLUCOSE  --  257* 157* 154*  BUN  --  17 19 19   CREATININE  --  0.52 0.53 0.56  CALCIUM  --  7.0* 7.6* 7.5*  MG  --   --  1.9 1.9  PHOS 2.1*  --  2.7 2.7   Liver Function Tests:  Recent Labs Lab 08/19/13 0400  AST 40*  ALT 66*  ALKPHOS 107  BILITOT 0.2*  PROT 4.8*  ALBUMIN 1.9*   No results found for this basename: LIPASE, AMYLASE,  in the last 168 hours No results found for this basename: AMMONIA,  in the last 168 hours CBC:  Recent Labs Lab 08/16/13 2000 08/17/13 0422 08/18/13 0430 08/19/13 0400  WBC 7.1 7.2 9.8 9.9  NEUTROABS  --   --   --  6.8  HGB 10.1* 10.5* 11.2* 11.1*  HCT 30.1* 30.5* 33.1* 32.2*  MCV 94.4 94.1 92.7 92.5  PLT 242 230 276 255   Cardiac Enzymes: No results found for  this basename: CKTOTAL, CKMB, CKMBINDEX, TROPONINI,  in the last 168 hours BNP: BNP (last 3 results) No results found for this basename: PROBNP,  in the last 8760 hours CBG:  Recent Labs Lab 08/22/13 0719 08/22/13 1241 08/22/13 1606 08/22/13 2202 08/23/13 0720  GLUCAP 96 184* 166* 135* 105*    Principal Problem:   Incarcerated femoral hernia Active Problems:   HYPOTHYROIDISM  HYPERLIPIDEMIA   DEMENTIA   HYPERTENSION   AV BLOCK, COMPLETE   PACEMAKER, PERMANENT   Acute respiratory failure   Aspiration pneumonia   Sepsis   Altered mental status   Time coordinating discharge: >30 mins  Signed:  Keairra Bardon, ANP-BC

## 2013-08-23 NOTE — Progress Notes (Signed)
10 Days Post-Op  Subjective: No new problems. Remains stable and afebrile. No new medical issues per nursing staff. She seems ready for discharge and transition to SNF, once bed is improved.  The last entry from social work reveals that the family does not want her to return to [place] in place. They were apparently going to poor [facility] and call back with update. I spoke with nursing this morning and they are not aware of that she has been approved at [facility]. They will contact social work this morning.    Objective: Vital signs in last 24 hours: Temp:  [97.6 F (36.4 C)-98.4 F (36.9 C)] 98.1 F (36.7 C) (11/28 0528) Pulse Rate:  [70-73] 70 (11/28 0528) Resp:  [16-18] 16 (11/28 0528) BP: (119-144)/(48-69) 144/62 mmHg (11/28 0528) SpO2:  [91 %-96 %] 96 % (11/28 0528) Last BM Date: 08/22/13  Intake/Output from previous day: 11/27 0701 - 11/28 0700 In: 160 [I.V.:160] Out: -  Intake/Output this shift: Total I/O In: 160 [I.V.:160] Out: -   General appearance: alert. Pleasant. Confused. No distress. Resp: clear to auscultation bilaterally GI: abdomen is soft, nontender, nondistended, active bowel sounds. Right inguinal incision looks good. No infection or hematoma. Steri-Strips in place.  Lab Results:  Results for orders placed during the hospital encounter of 08/13/13 (from the past 24 hour(s))  GLUCOSE, CAPILLARY     Status: None   Collection Time    08/22/13  7:19 AM      Result Value Range   Glucose-Capillary 96  70 - 99 mg/dL  GLUCOSE, CAPILLARY     Status: Abnormal   Collection Time    08/22/13 12:41 PM      Result Value Range   Glucose-Capillary 184 (*) 70 - 99 mg/dL  GLUCOSE, CAPILLARY     Status: Abnormal   Collection Time    08/22/13  4:06 PM      Result Value Range   Glucose-Capillary 166 (*) 70 - 99 mg/dL  GLUCOSE, CAPILLARY     Status: Abnormal   Collection Time    08/22/13 10:02 PM      Result Value Range   Glucose-Capillary 135 (*) 70 - 99  mg/dL   Comment 1 Documented in Chart     Comment 2 Notify RN       Studies/Results: @RISRSLT24 @  . docusate sodium  100 mg Oral BID  . donepezil  10 mg Oral Daily  . enoxaparin (LOVENOX) injection  40 mg Subcutaneous Q24H  . feeding supplement (ENSURE COMPLETE)  237 mL Oral BID BM  . insulin aspart  0-15 Units Subcutaneous TID WC  . levothyroxine  137 mcg Oral QAC breakfast  . losartan  100 mg Oral Daily  . ondansetron  4 mg Intravenous Q6H  . pantoprazole  40 mg Oral Daily  . sodium chloride  10-40 mL Intracatheter Q12H     Assessment/Plan: s/p Procedure(s): HERNIA REPAIR INGUINAL INCARCERATED  Incarcerated femoral hernia with small bowel obstruction, status post primary repair with JP drain placement 08/13/2013. Satisfactory progress.Drain out.  Stable for transfer to [facility] SNF if bed approved. Will stand by. Continue PT   History CVA  CAD, CHF, AVB status post pacemaker  Hypertension  Dementia, mild  Aspiration pneumonia - resolved -Antibiotics discontinued 08/21/2013   VDRF - resolved Hypothyroidism   @PROBHOSP @  LOS: 10 days    Ishita Mcnerney M 08/23/2013  . .prob

## 2013-08-26 ENCOUNTER — Encounter: Payer: Medicare Other | Admitting: *Deleted

## 2013-08-27 DIAGNOSIS — K56609 Unspecified intestinal obstruction, unspecified as to partial versus complete obstruction: Secondary | ICD-10-CM | POA: Diagnosis not present

## 2013-08-27 DIAGNOSIS — I10 Essential (primary) hypertension: Secondary | ICD-10-CM | POA: Diagnosis not present

## 2013-08-27 DIAGNOSIS — J189 Pneumonia, unspecified organism: Secondary | ICD-10-CM | POA: Diagnosis not present

## 2013-08-27 DIAGNOSIS — E039 Hypothyroidism, unspecified: Secondary | ICD-10-CM | POA: Diagnosis not present

## 2013-08-27 DIAGNOSIS — E876 Hypokalemia: Secondary | ICD-10-CM | POA: Diagnosis not present

## 2013-08-27 NOTE — Discharge Summary (Signed)
Agree with discharge summary.  Angelia Mould. Derrell Lolling, M.D., [facility] General and Minimally invasive Surgery Breast and Colorectal Surgery Office:   [phone] Pager:   [phone]

## 2013-08-29 DIAGNOSIS — I10 Essential (primary) hypertension: Secondary | ICD-10-CM | POA: Diagnosis not present

## 2013-08-29 DIAGNOSIS — E876 Hypokalemia: Secondary | ICD-10-CM | POA: Diagnosis not present

## 2013-08-29 DIAGNOSIS — R0602 Shortness of breath: Secondary | ICD-10-CM | POA: Diagnosis not present

## 2013-08-29 DIAGNOSIS — J189 Pneumonia, unspecified organism: Secondary | ICD-10-CM | POA: Diagnosis not present

## 2013-09-04 DIAGNOSIS — E876 Hypokalemia: Secondary | ICD-10-CM | POA: Diagnosis not present

## 2013-09-04 DIAGNOSIS — J189 Pneumonia, unspecified organism: Secondary | ICD-10-CM | POA: Diagnosis not present

## 2013-09-04 DIAGNOSIS — I10 Essential (primary) hypertension: Secondary | ICD-10-CM | POA: Diagnosis not present

## 2013-09-04 DIAGNOSIS — R0602 Shortness of breath: Secondary | ICD-10-CM | POA: Diagnosis not present

## 2013-09-06 ENCOUNTER — Ambulatory Visit: Payer: Medicare Other | Admitting: Internal Medicine

## 2013-09-10 ENCOUNTER — Encounter: Payer: Self-pay | Admitting: *Deleted

## 2013-09-11 ENCOUNTER — Telehealth (INDEPENDENT_AMBULATORY_CARE_PROVIDER_SITE_OTHER): Payer: Self-pay

## 2013-09-11 NOTE — Telephone Encounter (Signed)
Called and left message with Lurena Joiner (pt ER contact) to call office to get patient scheduled for PO appt.

## 2013-09-11 NOTE — Telephone Encounter (Signed)
[name] called back and I scheduled pt a postop appt with Dr. Derrell Lolling for 10/04/13 with an arrival time of 5:00pm.  She states pt is still a resident at [facility] and is unsure when the pt with be discharged from there.  She is agreeable with this appt.

## 2013-09-11 NOTE — Telephone Encounter (Signed)
Message copied by Maryan Puls on Wed Sep 11, 2013 11:38 AM ------      Message from: Larry Sierras      Created: Thu Sep 05, 2013  1:07 PM      Regarding: APPT      Contact: [phone]       CALL [facility] AT [facility]/PT NEEDS 1ST CK COLON SX/D-C 08-21-13/GM ------

## 2013-09-13 DIAGNOSIS — J189 Pneumonia, unspecified organism: Secondary | ICD-10-CM | POA: Diagnosis not present

## 2013-09-13 DIAGNOSIS — E039 Hypothyroidism, unspecified: Secondary | ICD-10-CM | POA: Diagnosis not present

## 2013-09-13 DIAGNOSIS — J9610 Chronic respiratory failure, unspecified whether with hypoxia or hypercapnia: Secondary | ICD-10-CM | POA: Diagnosis not present

## 2013-09-20 DIAGNOSIS — R05 Cough: Secondary | ICD-10-CM | POA: Diagnosis not present

## 2013-09-23 DIAGNOSIS — E876 Hypokalemia: Secondary | ICD-10-CM | POA: Diagnosis not present

## 2013-09-23 DIAGNOSIS — R0602 Shortness of breath: Secondary | ICD-10-CM | POA: Diagnosis not present

## 2013-09-23 DIAGNOSIS — F411 Generalized anxiety disorder: Secondary | ICD-10-CM | POA: Diagnosis not present

## 2013-09-23 DIAGNOSIS — R062 Wheezing: Secondary | ICD-10-CM | POA: Diagnosis not present

## 2013-09-24 DIAGNOSIS — E876 Hypokalemia: Secondary | ICD-10-CM | POA: Diagnosis not present

## 2013-09-24 DIAGNOSIS — R0602 Shortness of breath: Secondary | ICD-10-CM | POA: Diagnosis not present

## 2013-09-24 DIAGNOSIS — F411 Generalized anxiety disorder: Secondary | ICD-10-CM | POA: Diagnosis not present

## 2013-09-24 DIAGNOSIS — R062 Wheezing: Secondary | ICD-10-CM | POA: Diagnosis not present

## 2013-10-01 ENCOUNTER — Telehealth (INDEPENDENT_AMBULATORY_CARE_PROVIDER_SITE_OTHER): Payer: Self-pay

## 2013-10-01 NOTE — Telephone Encounter (Signed)
[facility] called back and said the family cannot do appt time of 8:50am. They are keeping the 5:00pm appt.

## 2013-10-01 NOTE — Telephone Encounter (Signed)
Called and spoke to [facility] regarding appointment time change.  Left message to see if patient could come in at 8:50 am w/Dr. Rosendo Gros on 10/04/13 instead of 5:00 pm.  Will await return call from facility regarding transportation and whether or not appointment time change will work for patient.

## 2013-10-04 ENCOUNTER — Ambulatory Visit (INDEPENDENT_AMBULATORY_CARE_PROVIDER_SITE_OTHER): Payer: Medicare Other | Admitting: General Surgery

## 2013-10-04 ENCOUNTER — Encounter (INDEPENDENT_AMBULATORY_CARE_PROVIDER_SITE_OTHER): Payer: Self-pay | Admitting: General Surgery

## 2013-10-04 VITALS — BP 127/71 | HR 82 | Temp 98.8°F | Resp 18 | Ht 65.0 in | Wt 120.8 lb

## 2013-10-04 DIAGNOSIS — Z9889 Other specified postprocedural states: Secondary | ICD-10-CM

## 2013-10-04 NOTE — Progress Notes (Signed)
Patient ID: [name], female   DOB: 17-Mar-1922, 80 y.o.   MRN: [id number] Post op course The patient is an 80 year old female status post right femoral hernia repair secondary to strangulate the bowel. Patient also the bowel resection with anastomosis. Patient had a rehabilitation today is continue with her strengthening exercises. Patient had no issues with bowel function or tolerating a diet. Patient had no wound healing issues.  On Exam: The wound is clean dry and intact, there is no hernia on palpation or Valsalva   Assessment and Plan A 80 year old female status post right femoral hernia repair, small bowel resection and anastomosis 1. The patient in followup as necessary 2. Patient can resume normal activities   Ralene Ok, MD [facility], [facility] Trauma & Emergency Surgery

## 2013-10-09 DIAGNOSIS — F0280 Dementia in other diseases classified elsewhere without behavioral disturbance: Secondary | ICD-10-CM | POA: Diagnosis not present

## 2013-10-09 DIAGNOSIS — I2510 Atherosclerotic heart disease of native coronary artery without angina pectoris: Secondary | ICD-10-CM

## 2013-10-09 DIAGNOSIS — IMO0001 Reserved for inherently not codable concepts without codable children: Secondary | ICD-10-CM | POA: Diagnosis not present

## 2013-10-09 DIAGNOSIS — R269 Unspecified abnormalities of gait and mobility: Secondary | ICD-10-CM | POA: Diagnosis not present

## 2013-10-09 DIAGNOSIS — J159 Unspecified bacterial pneumonia: Secondary | ICD-10-CM

## 2013-10-09 DIAGNOSIS — Z96649 Presence of unspecified artificial hip joint: Secondary | ICD-10-CM | POA: Diagnosis not present

## 2013-10-09 DIAGNOSIS — R4182 Altered mental status, unspecified: Secondary | ICD-10-CM | POA: Diagnosis not present

## 2013-10-09 DIAGNOSIS — I10 Essential (primary) hypertension: Secondary | ICD-10-CM

## 2013-10-09 DIAGNOSIS — F0390 Unspecified dementia without behavioral disturbance: Secondary | ICD-10-CM | POA: Diagnosis not present

## 2013-10-09 DIAGNOSIS — M6281 Muscle weakness (generalized): Secondary | ICD-10-CM | POA: Diagnosis not present

## 2013-10-10 ENCOUNTER — Encounter: Payer: Self-pay | Admitting: Internal Medicine

## 2013-10-10 ENCOUNTER — Telehealth: Payer: Self-pay

## 2013-10-10 ENCOUNTER — Other Ambulatory Visit (INDEPENDENT_AMBULATORY_CARE_PROVIDER_SITE_OTHER): Payer: Medicare Other

## 2013-10-10 ENCOUNTER — Ambulatory Visit (INDEPENDENT_AMBULATORY_CARE_PROVIDER_SITE_OTHER)
Admission: RE | Admit: 2013-10-10 | Discharge: 2013-10-10 | Disposition: A | Discharge status: Home / Self Care | Payer: Medicare Other | Source: Ambulatory Visit | Attending: Internal Medicine | Admitting: Internal Medicine

## 2013-10-10 ENCOUNTER — Ambulatory Visit (INDEPENDENT_AMBULATORY_CARE_PROVIDER_SITE_OTHER): Payer: Medicare Other | Admitting: Internal Medicine

## 2013-10-10 VITALS — BP 106/68 | HR 90 | Temp 100.0°F | Wt 119.5 lb

## 2013-10-10 DIAGNOSIS — D509 Iron deficiency anemia, unspecified: Secondary | ICD-10-CM

## 2013-10-10 DIAGNOSIS — R059 Cough, unspecified: Secondary | ICD-10-CM

## 2013-10-10 DIAGNOSIS — D6489 Other specified anemias: Secondary | ICD-10-CM | POA: Diagnosis not present

## 2013-10-10 DIAGNOSIS — I10 Essential (primary) hypertension: Secondary | ICD-10-CM

## 2013-10-10 DIAGNOSIS — R05 Cough: Secondary | ICD-10-CM

## 2013-10-10 DIAGNOSIS — J189 Pneumonia, unspecified organism: Secondary | ICD-10-CM | POA: Diagnosis not present

## 2013-10-10 LAB — URINALYSIS, ROUTINE W REFLEX MICROSCOPIC
BILIRUBIN URINE: NEGATIVE
HGB URINE DIPSTICK: NEGATIVE
Ketones, ur: NEGATIVE
LEUKOCYTES UA: NEGATIVE
NITRITE: NEGATIVE
PH: 5.5 (ref 5.0–8.0)
RBC / HPF: NONE SEEN (ref 0–?)
Specific Gravity, Urine: 1.02 (ref 1.000–1.030)
Total Protein, Urine: NEGATIVE
UROBILINOGEN UA: 0.2 (ref 0.0–1.0)
Urine Glucose: NEGATIVE

## 2013-10-10 LAB — CBC WITH DIFFERENTIAL/PLATELET
BASOS ABS: 0 10*3/uL (ref 0.0–0.1)
Basophils Relative: 0 % (ref 0.0–3.0)
Eosinophils Absolute: 0 10*3/uL (ref 0.0–0.7)
Eosinophils Relative: 0 % (ref 0.0–5.0)
HCT: 37.8 % (ref 36.0–46.0)
HEMOGLOBIN: 12.8 g/dL (ref 12.0–15.0)
LYMPHS PCT: 8.7 % — AB (ref 12.0–46.0)
Lymphs Abs: 2.2 10*3/uL (ref 0.7–4.0)
MCHC: 33.8 g/dL (ref 30.0–36.0)
MCV: 89.8 fl (ref 78.0–100.0)
MONOS PCT: 4.1 % (ref 3.0–12.0)
Monocytes Absolute: 1 10*3/uL (ref 0.1–1.0)
NEUTROS ABS: 22 10*3/uL — AB (ref 1.4–7.7)
Platelets: 296 10*3/uL (ref 150.0–400.0)
RBC: 4.21 Mil/uL (ref 3.87–5.11)
RDW: 14.7 % — AB (ref 11.5–14.6)
WBC: 25.2 10*3/uL (ref 4.5–10.5)

## 2013-10-10 LAB — BASIC METABOLIC PANEL
BUN: 19 mg/dL (ref 6–23)
CO2: 26 meq/L (ref 19–32)
CREATININE: 1.1 mg/dL (ref 0.4–1.2)
Calcium: 9 mg/dL (ref 8.4–10.5)
Chloride: 94 mEq/L — ABNORMAL LOW (ref 96–112)
GFR: 51.31 mL/min — ABNORMAL LOW (ref >60.00)
GLUCOSE: 114 mg/dL — AB (ref 70–99)
Potassium: 3.9 mEq/L (ref 3.5–5.1)
Sodium: 130 mEq/L — ABNORMAL LOW (ref 135–145)

## 2013-10-10 LAB — HEPATIC FUNCTION PANEL
ALBUMIN: 2.9 g/dL — AB (ref 3.5–5.2)
ALT: 22 U/L (ref 0–35)
AST: 28 U/L (ref 0–37)
Alkaline Phosphatase: 92 U/L (ref 39–117)
Bilirubin, Direct: 0.2 mg/dL (ref 0.0–0.3)
TOTAL PROTEIN: 6.6 g/dL (ref 6.0–8.3)
Total Bilirubin: 0.9 mg/dL (ref 0.3–1.2)

## 2013-10-10 LAB — IBC PANEL
Iron: 13 ug/dL — ABNORMAL LOW (ref 42–145)
Saturation Ratios: 6.2 % — ABNORMAL LOW (ref 20.0–50.0)
TRANSFERRIN: 150.7 mg/dL — AB (ref 212.0–360.0)

## 2013-10-10 MED ORDER — LEVOFLOXACIN 250 MG PO TABS: 250.0000 mg | ORAL_TABLET | Freq: Every day | ORAL | Status: DC

## 2013-10-10 MED ORDER — DONEPEZIL HCL 10 MG PO TABS: 10.0000 mg | ORAL_TABLET | Freq: Every day | ORAL | Status: DC

## 2013-10-10 MED ORDER — LEVOTHYROXINE SODIUM 125 MCG PO TABS: 125.0000 ug | ORAL_TABLET | Freq: Every day | ORAL | Status: DC

## 2013-10-10 MED ORDER — LOSARTAN POTASSIUM 100 MG PO TABS: 100.0000 mg | ORAL_TABLET | Freq: Every day | ORAL | Status: DC

## 2013-10-10 MED ORDER — AMLODIPINE BESYLATE 10 MG PO TABS: 10.0000 mg | ORAL_TABLET | Freq: Every day | ORAL | Status: DC

## 2013-10-10 MED ORDER — ASPIRIN-DIPYRIDAMOLE ER 25-200 MG PO CP12: 1.0000 | ORAL_CAPSULE | Freq: Two times a day (BID) | ORAL | Status: DC

## 2013-10-10 MED ORDER — HYDROCODONE-HOMATROPINE 5-1.5 MG/5ML PO SYRP: 5.0000 mL | ORAL_SOLUTION | Freq: Four times a day (QID) | ORAL | Status: DC | PRN

## 2013-10-10 MED ORDER — CEFTRIAXONE SODIUM 1 G IJ SOLR
1.0000 g | Freq: Once | INTRAMUSCULAR | Status: AC
  Administered 2013-10-10: 1 g via INTRAMUSCULAR

## 2013-10-10 MED ORDER — PRAVASTATIN SODIUM 40 MG PO TABS: 40.0000 mg | ORAL_TABLET | Freq: Every day | ORAL | Status: DC

## 2013-10-10 NOTE — Telephone Encounter (Signed)
[name] for verbal, but likely has PNA today, may not be able to participate for 2-3 days

## 2013-10-10 NOTE — Telephone Encounter (Signed)
[name] gave MD ok and instructions regarding PNA.

## 2013-10-10 NOTE — Patient Instructions (Signed)
You had the antibiotic shot today Please take all new medication as prescribed - the antibiotic, cough medicine OK to stop the miralax, colace and iron Please continue all other medications as before, including the aricept Please have the pharmacy call with any other refills you may need.  Please go to the XRAY Department in the Basement (go straight as you get off the elevator) for the x-ray testing Please go to the LAB in the Basement (turn left off the elevator) for the tests to be done today You will be contacted by phone if any changes need to be made immediately.  Otherwise, you will receive a letter about your results with an explanation, but please check with MyChart first.

## 2013-10-10 NOTE — Telephone Encounter (Signed)
[facility] would live a verbal ok to start PT on this patient.  [phone] call back Andee Poles)

## 2013-10-10 NOTE — Telephone Encounter (Signed)
Critical Lab WBC count 25.2

## 2013-10-10 NOTE — Progress Notes (Signed)
Pre-visit discussion using our clinic review tool. No additional management support is needed unless otherwise documented below in the visit note.  

## 2013-10-11 ENCOUNTER — Ambulatory Visit (INDEPENDENT_AMBULATORY_CARE_PROVIDER_SITE_OTHER): Payer: Medicare Other | Admitting: Internal Medicine

## 2013-10-11 ENCOUNTER — Encounter: Payer: Self-pay | Admitting: Internal Medicine

## 2013-10-11 VITALS — BP 118/72 | HR 81 | Temp 98.6°F

## 2013-10-11 DIAGNOSIS — J189 Pneumonia, unspecified organism: Secondary | ICD-10-CM | POA: Diagnosis not present

## 2013-10-11 DIAGNOSIS — F0280 Dementia in other diseases classified elsewhere without behavioral disturbance: Secondary | ICD-10-CM | POA: Diagnosis not present

## 2013-10-11 DIAGNOSIS — R21 Rash and other nonspecific skin eruption: Secondary | ICD-10-CM | POA: Diagnosis not present

## 2013-10-11 DIAGNOSIS — J9600 Acute respiratory failure, unspecified whether with hypoxia or hypercapnia: Secondary | ICD-10-CM

## 2013-10-11 DIAGNOSIS — Z96649 Presence of unspecified artificial hip joint: Secondary | ICD-10-CM | POA: Diagnosis not present

## 2013-10-11 DIAGNOSIS — J181 Lobar pneumonia, unspecified organism: Principal | ICD-10-CM

## 2013-10-11 DIAGNOSIS — I10 Essential (primary) hypertension: Secondary | ICD-10-CM

## 2013-10-11 DIAGNOSIS — IMO0001 Reserved for inherently not codable concepts without codable children: Secondary | ICD-10-CM | POA: Diagnosis not present

## 2013-10-11 DIAGNOSIS — R4182 Altered mental status, unspecified: Secondary | ICD-10-CM | POA: Diagnosis not present

## 2013-10-11 DIAGNOSIS — R269 Unspecified abnormalities of gait and mobility: Secondary | ICD-10-CM | POA: Diagnosis not present

## 2013-10-11 DIAGNOSIS — M6281 Muscle weakness (generalized): Secondary | ICD-10-CM | POA: Diagnosis not present

## 2013-10-11 MED ORDER — CEFTRIAXONE SODIUM 1 G IJ SOLR
1.0000 g | Freq: Once | INTRAMUSCULAR | Status: AC
  Administered 2013-10-11: 1 g via INTRAMUSCULAR

## 2013-10-11 NOTE — Patient Instructions (Addendum)
You had the antibiotic shot again today (rocephin) Please use OTC desonex barrier cream for now for the redness and try to reduce the time to sit or lying down You are given the prescription for the duoderm if the redness does not improve in the next few days Please continue all other medications as before, and refills have been done if requested. Please have the pharmacy call with any other refills you may need.  You are given the cxr and lab results  OK to cont the PT and other home care as you can  Please go to ER if any worsening fever, chills, sob, pain, weakness, falls or other unusual symptoms  You are given the prescription for the shingles shot if you would like to do this soon at a pharmacy

## 2013-10-11 NOTE — Assessment & Plan Note (Signed)
With some improvement sats today, I think less wob,  to f/u any worsening symptoms or concerns

## 2013-10-11 NOTE — Assessment & Plan Note (Signed)
stable overall by history and exam, recent data reviewed with pt, and pt to continue medical treatment as before,  to f/u any worsening symptoms or concerns BP Readings from Last 3 Encounters:  10/11/13 118/72  10/10/13 106/68  10/04/13 127/71    

## 2013-10-11 NOTE — Progress Notes (Signed)
Pre-visit discussion using our clinic review tool. No additional management support is needed unless otherwise documented below in the visit note.  

## 2013-10-11 NOTE — Progress Notes (Signed)
Subjective:    Patient ID: [name], female    DOB: [birth year], 80 y.o.   MRN: [id number]  HPI  Here to f/u, appaers to be "holding her own", o2 sats improved, 93% per PT at home just prior to here, and now 95% even with ambulation, and appears to have a bit more energy to me; here with daughter in law as per yesterday, who reports at least no worsening weakness, cough, sob/wob, pain, or confusion, or overt fever, chills.  Family with her also mentions area of redness to sacral area ? Pressure vs diarrhea related worse in the past 2 wks, no actual skin breakdown. Past Medical History  Diagnosis Date  . Aphasia 01/09/2009  . 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 CEREBROVASCULAR ACCIDENT, HX OF 11/20/2007  . CVA 01/09/2009  . DEMENTIA 05/19/2008  . DIVERTICULOSIS, COLON 11/20/2007  . FATIGUE 11/20/2007  . GLUCOSE INTOLERANCE 12/06/2010  . HYPERLIPIDEMIA 11/20/2007  . HYPERTENSION 11/20/2007    Echo was done 07/07/11: mod LVH, EF 55-60%, grade 1 diast dysfxn, mild MR, mild LAE, mod TR, PASP 39  . HYPOTHYROIDISM 11/20/2007  . IRON DEFICIENCY 12/06/2010  . PACEMAKER, PERMANENT 01/09/2009  . TIA 05/14/2003    elevated CE's in setting of TIA and falls in 10/12 - echo normal with no further cardiac w/u pursued  . Urinary incontinence 01/09/2009  . Impaired glucose tolerance 06/03/2011  . TIA (transient ischemic attack) 10/10/2011  . Dementia   . CHF (congestive heart failure)   . Coronary artery disease   . Arthritis     right hand  . Pacemaker 2002  . Femoral fracture 07/01/2013   Past Surgical History  Procedure Laterality Date  . Medtronic cynthia dual chamber pacemaker serial number was [id number]...04/15/2009    . Abdominal hysterectomy    . S/p cerebral aneurysm  1991  . S/p pacemaker ddd    . Cholecystectomy    . S/p retinal attachment    . S/p thyroidectomy    . Cystocele repair N/A 11/26/2012    Procedure: Cystocele Repair with Lefort Colpocleisis.  colpectomy Levatorplasty  Perineorraphy;  Surgeon: Lovenia Kim, MD;  Location: [facility];  Service: Gynecology;  Laterality: N/A;  Cystocele Repair with Lefort Colpocleisis.  . Hip arthroplasty Left 07/02/2013    Procedure: ARTHROPLASTY BIPOLAR HIP;  Surgeon: Mauri Pole, MD;  Location: [facility];  Service: Orthopedics;  Laterality: Left;  . Inguinal hernia repair Right 08/13/2013    Procedure: HERNIA REPAIR INGUINAL INCARCERATED;  Surgeon: Ralene Ok, MD;  Location: [facility];  Service: General;  Laterality: Right;    reports that she has never smoked. She has never used smokeless tobacco. She reports that she does not drink alcohol or use illicit drugs. family history includes Cancer in her mother. Allergies  Allergen Reactions  . Codeine     unknown   Current Outpatient Prescriptions on File Prior to Visit  Medication Sig Dispense Refill  . amLODipine (NORVASC) 10 MG tablet Take 1 tablet (10 mg total) by mouth daily. Resume in 3 days if SBP > 150  90 tablet  3  . Calcium Carbonate-Vitamin D (CALTRATE 600+D) 600-400 MG-UNIT per tablet Take 1 tablet by mouth daily.        Marland Kitchen dipyridamole-aspirin (AGGRENOX) 200-25 MG per 12 hr capsule Take 1 capsule by mouth 2 (two) times daily.  180 capsule  3  . docusate sodium 100 MG CAPS Take 100 mg by mouth 2 (two) times daily.  10 capsule  0  . donepezil (ARICEPT) 10 MG tablet Take 1 tablet (10 mg total) by mouth daily.  90 tablet  3  . feeding supplement, ENSURE COMPLETE, (ENSURE COMPLETE) LIQD Take 237 mLs by mouth 2 (two) times daily between meals.      . ferrous sulfate 325 (65 FE) MG tablet Take 1 tablet (325 mg total) by mouth 3 (three) times daily after meals.    3  . HYDROcodone-homatropine (HYCODAN) 5-1.5 MG/5ML syrup Take 5 mLs by mouth every 6 (six) hours as needed for cough.  180 mL  0  . ipratropium-albuterol (DUONEB) 0.5-2.5 (3) MG/3ML SOLN Take 3 mLs by nebulization.      Marland Kitchen levofloxacin (LEVAQUIN) 250 MG tablet Take 1 tablet (250 mg total) by mouth daily.  10  tablet  0  . levothyroxine (SYNTHROID, LEVOTHROID) 125 MCG tablet Take 1 tablet (125 mcg total) by mouth daily.  90 tablet  3  . losartan (COZAAR) 100 MG tablet Take 1 tablet (100 mg total) by mouth daily.  90 tablet  3  . Multiple Vitamin (MULTIVITAMIN WITH MINERALS) TABS Take 1 tablet by mouth daily. Centrum silver      . polyethylene glycol (MIRALAX / GLYCOLAX) packet Take 17 g by mouth 2 (two) times daily.  14 each  0  . polyvinyl alcohol (ARTIFICIAL TEARS) 1.4 % ophthalmic solution Place 1 drop into both eyes 2 (two) times daily.      . pravastatin (PRAVACHOL) 40 MG tablet Take 1 tablet (40 mg total) by mouth daily.  90 tablet  3   No current facility-administered medications on file prior to visit.   Review of Systems  unable due to dementia     Objective:   Physical Exam BP 118/72  Pulse 81  Temp(Src) 98.6 F (37 C) (Oral)  SpO2 95% VS noted, mild to mod ill Constitutional: Pt appears thin, chronic ill but with somewhat brighter demeanor, somewhat improved strenght/stamina, needs a bit less assist with up on exam table HENT: Head: NCAT.  Right Ear: External ear normal.  Left Ear: External ear normal.  Eyes: Conjunctivae and EOM are normal. Pupils are equal, round, and reactive to light.  Neck: Normal range of motion. Neck supple.  Cardiovascular: Normal rate and regular rhythm.   Pulmonary/Chest: Effort normal and breath sounds with RLL rales  Abd:  Soft, NT, non-distended, + BS Neurological: Pt is alert. At baseline confusion Skin: Skin is warm. No erythema.  Psychiatric: Pt behavior is normal.      Assessment & Plan:

## 2013-10-11 NOTE — Assessment & Plan Note (Signed)
?   Irritant vs pressure related , for desonex prn, keep area clean (family is good at this), turn frequently to avoid pressure, duoderm if persists over next few days

## 2013-10-11 NOTE — Assessment & Plan Note (Signed)
I think some clinical improvement today, for rocephin IM repeat today, cont oral levaquin, f/u any worsening

## 2013-10-12 ENCOUNTER — Telehealth: Payer: Self-pay | Admitting: Internal Medicine

## 2013-10-12 DIAGNOSIS — R269 Unspecified abnormalities of gait and mobility: Secondary | ICD-10-CM | POA: Diagnosis not present

## 2013-10-12 DIAGNOSIS — R4182 Altered mental status, unspecified: Secondary | ICD-10-CM | POA: Diagnosis not present

## 2013-10-12 DIAGNOSIS — IMO0001 Reserved for inherently not codable concepts without codable children: Secondary | ICD-10-CM | POA: Diagnosis not present

## 2013-10-12 DIAGNOSIS — F0280 Dementia in other diseases classified elsewhere without behavioral disturbance: Secondary | ICD-10-CM | POA: Diagnosis not present

## 2013-10-12 DIAGNOSIS — M6281 Muscle weakness (generalized): Secondary | ICD-10-CM | POA: Diagnosis not present

## 2013-10-12 DIAGNOSIS — Z96649 Presence of unspecified artificial hip joint: Secondary | ICD-10-CM | POA: Diagnosis not present

## 2013-10-12 NOTE — Assessment & Plan Note (Signed)
For f/u iron/cbc, doubt she needs to cont iron supplement

## 2013-10-12 NOTE — Assessment & Plan Note (Signed)
For above,  to f/u any worsening symptoms or concerns

## 2013-10-12 NOTE — Assessment & Plan Note (Addendum)
suspicous for recurrent pna - for cxr, labs, rocephin IM now, empriic levaquin asd,  to f/u any worsening symptoms or concerns  Note:  Total time for pt hx, exam, review of record with pt in the room, determination of diagnoses and plan for further eval and tx is > 40 min, with over 50% spent in coordination and counseling of patient

## 2013-10-12 NOTE — Progress Notes (Signed)
Subjective:    Patient ID: [name], female    DOB: May 24, 1922, 80 y.o.   MRN: [id number]  HPI  Here with daughter in law who gives hx, pt unable due to illness and dementia.  For 6 mo f/u, incidentally with worsening cough, sob, confusion, general weakness and malaise, walks with walker but no falls. Has hx of asp pna on vent/sepsis oct 2014.  Had 2 rounds antibx post hospn at rehab at [facility].  Denies urinary symptoms such as dysuria, frequency, urgency, flank pain, hematuria or n/v, fever, chills, has chronic incontinence.  Had several loose stool, but no apparent abd pain, n/v, blood.  Overall has lost 20 lbs since oct 2014 illness.  Appeite still decreased.  .  Daughter in law wondering if she still needs to take iron, colace and miralax in light of the diarrhea Past Medical History  Diagnosis Date  . Aphasia 01/09/2009  . 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 CEREBROVASCULAR ACCIDENT, HX OF 11/20/2007  . CVA 01/09/2009  . DEMENTIA 05/19/2008  . DIVERTICULOSIS, COLON 11/20/2007  . FATIGUE 11/20/2007  . GLUCOSE INTOLERANCE 12/06/2010  . HYPERLIPIDEMIA 11/20/2007  . HYPERTENSION 11/20/2007    Echo was done 07/07/11: mod LVH, EF 55-60%, grade 1 diast dysfxn, mild MR, mild LAE, mod TR, PASP 39  . HYPOTHYROIDISM 11/20/2007  . IRON DEFICIENCY 12/06/2010  . PACEMAKER, PERMANENT 01/09/2009  . TIA 05/14/2003    elevated CE's in setting of TIA and falls in 10/12 - echo normal with no further cardiac w/u pursued  . Urinary incontinence 01/09/2009  . Impaired glucose tolerance 06/03/2011  . TIA (transient ischemic attack) 10/10/2011  . Dementia   . CHF (congestive heart failure)   . Coronary artery disease   . Arthritis     right hand  . Pacemaker 2002  . Femoral fracture 07/01/2013   Past Surgical History  Procedure Laterality Date  . Medtronic cynthia dual chamber pacemaker serial number was [id number]...04/15/2009    . Abdominal hysterectomy    . S/p cerebral aneurysm  1991  . S/p  pacemaker ddd    . Cholecystectomy    . S/p retinal attachment    . S/p thyroidectomy    . Cystocele repair N/A 11/26/2012    Procedure: Cystocele Repair with Lefort Colpocleisis.  colpectomy Levatorplasty Perineorraphy;  Surgeon: Lovenia Kim, MD;  Location: [facility];  Service: Gynecology;  Laterality: N/A;  Cystocele Repair with Lefort Colpocleisis.  . Hip arthroplasty Left 07/02/2013    Procedure: ARTHROPLASTY BIPOLAR HIP;  Surgeon: Mauri Pole, MD;  Location: [facility];  Service: Orthopedics;  Laterality: Left;  . Inguinal hernia repair Right 08/13/2013    Procedure: HERNIA REPAIR INGUINAL INCARCERATED;  Surgeon: Ralene Ok, MD;  Location: [facility];  Service: General;  Laterality: Right;    reports that she has never smoked. She has never used smokeless tobacco. She reports that she does not drink alcohol or use illicit drugs. family history includes Cancer in her mother. Allergies  Allergen Reactions  . Codeine     unknown   Current Outpatient Prescriptions on File Prior to Visit  Medication Sig Dispense Refill  . Calcium Carbonate-Vitamin D (CALTRATE 600+D) 600-400 MG-UNIT per tablet Take 1 tablet by mouth daily.        Marland Kitchen docusate sodium 100 MG CAPS Take 100 mg by mouth 2 (two) times daily.  10 capsule  0  . feeding supplement, ENSURE COMPLETE, (ENSURE COMPLETE) LIQD Take 237 mLs by mouth 2 (two) times  daily between meals.      . ferrous sulfate 325 (65 FE) MG tablet Take 1 tablet (325 mg total) by mouth 3 (three) times daily after meals.    3  . Multiple Vitamin (MULTIVITAMIN WITH MINERALS) TABS Take 1 tablet by mouth daily. Centrum silver      . polyethylene glycol (MIRALAX / GLYCOLAX) packet Take 17 g by mouth 2 (two) times daily.  14 each  0   No current facility-administered medications on file prior to visit.   Review of Systems  Constitutional: Negative for unexpected weight change, or unusual diaphoresis  HENT: Negative for tinnitus.   Eyes: Negative for photophobia and  visual disturbance.  Respiratory: Negative for choking and stridor.   Gastrointestinal: Negative for vomiting and blood in stool.  Genitourinary: Negative for hematuria and decreased urine volume.  Musculoskeletal: Negative for acute joint swelling Skin: Negative for color change and wound.  Neurological: Negative for tremors and numbness other than noted  Psychiatric/Behavioral: Negative for decreased concentration or  hyperactivity.       Objective:   Physical Exam BP 106/68  Pulse 90  Temp(Src) 100 F (37.8 C) (Oral)  Wt 119 lb 8 oz (54.205 kg)  SpO2 92% VS noted,  Constitutional: Pt appears well-developed and well-nourished.  HENT: Head: NCAT.  Right Ear: External ear normal.  Left Ear: External ear normal.  Eyes: Conjunctivae and EOM are normal. Pupils are equal, round, and reactive to light.  Neck: Normal range of motion. Neck supple.  Cardiovascular: Normal rate and regular rhythm.   Pulmonary/Chest: Effort normal and breath sounds with few bibasilar rales.  Abd:  Soft, NT, non-distended, + BS Neurological: Pt is alert. Not confused  Skin: Skin is warm. No erythema.  Psychiatric: Pt behavior is normal. Thought content normal.     Assessment & Plan:

## 2013-10-12 NOTE — Telephone Encounter (Signed)
Relevant patient education assigned to patient using Emmi. ° °

## 2013-10-12 NOTE — Assessment & Plan Note (Signed)
stable overall by history and exam, recent data reviewed with pt, and pt to continue medical treatment as before,  to f/u any worsening symptoms or concerns BP Readings from Last 3 Encounters:  10/11/13 118/72  10/10/13 106/68  10/04/13 127/71

## 2013-10-14 DIAGNOSIS — F0280 Dementia in other diseases classified elsewhere without behavioral disturbance: Secondary | ICD-10-CM | POA: Diagnosis not present

## 2013-10-14 DIAGNOSIS — IMO0001 Reserved for inherently not codable concepts without codable children: Secondary | ICD-10-CM | POA: Diagnosis not present

## 2013-10-14 DIAGNOSIS — M6281 Muscle weakness (generalized): Secondary | ICD-10-CM | POA: Diagnosis not present

## 2013-10-14 DIAGNOSIS — R269 Unspecified abnormalities of gait and mobility: Secondary | ICD-10-CM | POA: Diagnosis not present

## 2013-10-14 DIAGNOSIS — Z96649 Presence of unspecified artificial hip joint: Secondary | ICD-10-CM | POA: Diagnosis not present

## 2013-10-14 DIAGNOSIS — R4182 Altered mental status, unspecified: Secondary | ICD-10-CM | POA: Diagnosis not present

## 2013-10-15 ENCOUNTER — Telehealth: Payer: Self-pay | Admitting: Internal Medicine

## 2013-10-15 DIAGNOSIS — H9193 Unspecified hearing loss, bilateral: Secondary | ICD-10-CM

## 2013-10-15 NOTE — Telephone Encounter (Signed)
Thanks for the followup information;  Ok to continue all same treatment  I can go ahead and refer to ENT, which is usually best for hearing loss, prior to the actual hearing evaluation

## 2013-10-16 DIAGNOSIS — Z96649 Presence of unspecified artificial hip joint: Secondary | ICD-10-CM | POA: Diagnosis not present

## 2013-10-16 DIAGNOSIS — R4182 Altered mental status, unspecified: Secondary | ICD-10-CM | POA: Diagnosis not present

## 2013-10-16 DIAGNOSIS — M6281 Muscle weakness (generalized): Secondary | ICD-10-CM | POA: Diagnosis not present

## 2013-10-16 DIAGNOSIS — IMO0001 Reserved for inherently not codable concepts without codable children: Secondary | ICD-10-CM | POA: Diagnosis not present

## 2013-10-16 DIAGNOSIS — F0280 Dementia in other diseases classified elsewhere without behavioral disturbance: Secondary | ICD-10-CM | POA: Diagnosis not present

## 2013-10-16 DIAGNOSIS — R269 Unspecified abnormalities of gait and mobility: Secondary | ICD-10-CM | POA: Diagnosis not present

## 2013-10-18 DIAGNOSIS — Z96649 Presence of unspecified artificial hip joint: Secondary | ICD-10-CM | POA: Diagnosis not present

## 2013-10-18 DIAGNOSIS — M6281 Muscle weakness (generalized): Secondary | ICD-10-CM | POA: Diagnosis not present

## 2013-10-18 DIAGNOSIS — R269 Unspecified abnormalities of gait and mobility: Secondary | ICD-10-CM | POA: Diagnosis not present

## 2013-10-18 DIAGNOSIS — R4182 Altered mental status, unspecified: Secondary | ICD-10-CM | POA: Diagnosis not present

## 2013-10-18 DIAGNOSIS — IMO0001 Reserved for inherently not codable concepts without codable children: Secondary | ICD-10-CM | POA: Diagnosis not present

## 2013-10-18 DIAGNOSIS — F0280 Dementia in other diseases classified elsewhere without behavioral disturbance: Secondary | ICD-10-CM | POA: Diagnosis not present

## 2013-10-21 DIAGNOSIS — M6281 Muscle weakness (generalized): Secondary | ICD-10-CM | POA: Diagnosis not present

## 2013-10-21 DIAGNOSIS — IMO0001 Reserved for inherently not codable concepts without codable children: Secondary | ICD-10-CM | POA: Diagnosis not present

## 2013-10-21 DIAGNOSIS — R4182 Altered mental status, unspecified: Secondary | ICD-10-CM | POA: Diagnosis not present

## 2013-10-21 DIAGNOSIS — R269 Unspecified abnormalities of gait and mobility: Secondary | ICD-10-CM | POA: Diagnosis not present

## 2013-10-21 DIAGNOSIS — Z96649 Presence of unspecified artificial hip joint: Secondary | ICD-10-CM | POA: Diagnosis not present

## 2013-10-21 DIAGNOSIS — F0280 Dementia in other diseases classified elsewhere without behavioral disturbance: Secondary | ICD-10-CM | POA: Diagnosis not present

## 2013-10-22 DIAGNOSIS — F0280 Dementia in other diseases classified elsewhere without behavioral disturbance: Secondary | ICD-10-CM | POA: Diagnosis not present

## 2013-10-22 DIAGNOSIS — IMO0001 Reserved for inherently not codable concepts without codable children: Secondary | ICD-10-CM | POA: Diagnosis not present

## 2013-10-22 DIAGNOSIS — R269 Unspecified abnormalities of gait and mobility: Secondary | ICD-10-CM | POA: Diagnosis not present

## 2013-10-22 DIAGNOSIS — R4182 Altered mental status, unspecified: Secondary | ICD-10-CM | POA: Diagnosis not present

## 2013-10-22 DIAGNOSIS — M6281 Muscle weakness (generalized): Secondary | ICD-10-CM | POA: Diagnosis not present

## 2013-10-22 DIAGNOSIS — Z96649 Presence of unspecified artificial hip joint: Secondary | ICD-10-CM | POA: Diagnosis not present

## 2013-10-23 DIAGNOSIS — R4182 Altered mental status, unspecified: Secondary | ICD-10-CM | POA: Diagnosis not present

## 2013-10-23 DIAGNOSIS — H905 Unspecified sensorineural hearing loss: Secondary | ICD-10-CM | POA: Diagnosis not present

## 2013-10-23 DIAGNOSIS — F0280 Dementia in other diseases classified elsewhere without behavioral disturbance: Secondary | ICD-10-CM | POA: Diagnosis not present

## 2013-10-23 DIAGNOSIS — R269 Unspecified abnormalities of gait and mobility: Secondary | ICD-10-CM | POA: Diagnosis not present

## 2013-10-23 DIAGNOSIS — H903 Sensorineural hearing loss, bilateral: Secondary | ICD-10-CM | POA: Diagnosis not present

## 2013-10-23 DIAGNOSIS — H9319 Tinnitus, unspecified ear: Secondary | ICD-10-CM | POA: Diagnosis not present

## 2013-10-23 DIAGNOSIS — IMO0001 Reserved for inherently not codable concepts without codable children: Secondary | ICD-10-CM | POA: Diagnosis not present

## 2013-10-23 DIAGNOSIS — Z96649 Presence of unspecified artificial hip joint: Secondary | ICD-10-CM | POA: Diagnosis not present

## 2013-10-23 DIAGNOSIS — M6281 Muscle weakness (generalized): Secondary | ICD-10-CM | POA: Diagnosis not present

## 2013-10-24 ENCOUNTER — Ambulatory Visit (INDEPENDENT_AMBULATORY_CARE_PROVIDER_SITE_OTHER): Payer: Medicare Other | Admitting: Internal Medicine

## 2013-10-24 ENCOUNTER — Encounter: Payer: Self-pay | Admitting: Internal Medicine

## 2013-10-24 VITALS — BP 118/70 | HR 93 | Temp 98.1°F | Wt 124.2 lb

## 2013-10-24 DIAGNOSIS — J189 Pneumonia, unspecified organism: Secondary | ICD-10-CM

## 2013-10-24 DIAGNOSIS — R7309 Other abnormal glucose: Secondary | ICD-10-CM | POA: Diagnosis not present

## 2013-10-24 DIAGNOSIS — I10 Essential (primary) hypertension: Secondary | ICD-10-CM

## 2013-10-24 DIAGNOSIS — J181 Lobar pneumonia, unspecified organism: Principal | ICD-10-CM

## 2013-10-24 DIAGNOSIS — Z23 Encounter for immunization: Secondary | ICD-10-CM

## 2013-10-24 DIAGNOSIS — R7302 Impaired glucose tolerance (oral): Secondary | ICD-10-CM

## 2013-10-24 NOTE — Assessment & Plan Note (Signed)
stable overall by history and exam, recent data reviewed with pt, and pt to continue medical treatment as before,  to f/u any worsening symptoms or concerns, to watch for any wt loss and lower blood pressure especailly with symptoms

## 2013-10-24 NOTE — Patient Instructions (Addendum)
You had the new Prevnar pneumonia shot today  OK to hold on taking the duonebs  Please continue all other medications as before, and refills have been done if requested. Please have the pharmacy call with any other refills you may need.  Please continue to monitor your blood pressure on a regular basis;  If the top number becomes consistently less than 110 - 120, this would be likely too low, and you would need your medication reduced, especially if you have weakness or dizziness or falls.

## 2013-10-24 NOTE — Progress Notes (Signed)
Subjective:    Patient ID: [name], female    DOB: 1921/10/02, 80 y.o.   MRN: [id number]  HPI  Overall quite nicely since last visit, with minor residual cough, but Pt denies chest pain, increased sob or doe, wheezing, orthopnea, PND, increased LE swelling, palpitations, dizziness or syncope.  Stamina and sense of wellbeing returned.  PT and OT at [facility] working out well and she is able to follow commands and participate.  Has a new better sized seated walker with wheels, no recent falls.  No new complaints.  Had hearing test yesterday, will get new bilat hearing aids soon. Has gained 5 lbs back since her illness with ensure plus several cans per day- family assists with this.  Pt denies polydipsia, polyuria.  Past Medical History  Diagnosis Date  . Aphasia 01/09/2009  . 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 CEREBROVASCULAR ACCIDENT, HX OF 11/20/2007  . CVA 01/09/2009  . DEMENTIA 05/19/2008  . DIVERTICULOSIS, COLON 11/20/2007  . FATIGUE 11/20/2007  . GLUCOSE INTOLERANCE 12/06/2010  . HYPERLIPIDEMIA 11/20/2007  . HYPERTENSION 11/20/2007    Echo was done 07/07/11: mod LVH, EF 55-60%, grade 1 diast dysfxn, mild MR, mild LAE, mod TR, PASP 39  . HYPOTHYROIDISM 11/20/2007  . IRON DEFICIENCY 12/06/2010  . PACEMAKER, PERMANENT 01/09/2009  . TIA 05/14/2003    elevated CE's in setting of TIA and falls in 10/12 - echo normal with no further cardiac w/u pursued  . Urinary incontinence 01/09/2009  . Impaired glucose tolerance 06/03/2011  . TIA (transient ischemic attack) 10/10/2011  . Dementia   . CHF (congestive heart failure)   . Coronary artery disease   . Arthritis     right hand  . Pacemaker 2002  . Femoral fracture 07/01/2013   Past Surgical History  Procedure Laterality Date  . Medtronic cynthia dual chamber pacemaker serial number was [id number]...04/15/2009    . Abdominal hysterectomy    . S/p cerebral aneurysm  1991  . S/p pacemaker ddd    . Cholecystectomy    . S/p retinal  attachment    . S/p thyroidectomy    . Cystocele repair N/A 11/26/2012    Procedure: Cystocele Repair with Lefort Colpocleisis.  colpectomy Levatorplasty Perineorraphy;  Surgeon: Lovenia Kim, MD;  Location: [facility];  Service: Gynecology;  Laterality: N/A;  Cystocele Repair with Lefort Colpocleisis.  . Hip arthroplasty Left 07/02/2013    Procedure: ARTHROPLASTY BIPOLAR HIP;  Surgeon: Mauri Pole, MD;  Location: [facility];  Service: Orthopedics;  Laterality: Left;  . Inguinal hernia repair Right 08/13/2013    Procedure: HERNIA REPAIR INGUINAL INCARCERATED;  Surgeon: Ralene Ok, MD;  Location: [facility];  Service: General;  Laterality: Right;    reports that she has never smoked. She has never used smokeless tobacco. She reports that she does not drink alcohol or use illicit drugs. family history includes Cancer in her mother. Allergies  Allergen Reactions  . Codeine     unknown   Current Outpatient Prescriptions on File Prior to Visit  Medication Sig Dispense Refill  . amLODipine (NORVASC) 10 MG tablet Take 1 tablet (10 mg total) by mouth daily. Resume in 3 days if SBP > 150  90 tablet  3  . Calcium Carbonate-Vitamin D (CALTRATE 600+D) 600-400 MG-UNIT per tablet Take 1 tablet by mouth daily.        Marland Kitchen dipyridamole-aspirin (AGGRENOX) 200-25 MG per 12 hr capsule Take 1 capsule by mouth 2 (two) times daily.  180 capsule  3  .  docusate sodium 100 MG CAPS Take 100 mg by mouth 2 (two) times daily.  10 capsule  0  . donepezil (ARICEPT) 10 MG tablet Take 1 tablet (10 mg total) by mouth daily.  90 tablet  3  . feeding supplement, ENSURE COMPLETE, (ENSURE COMPLETE) LIQD Take 237 mLs by mouth 2 (two) times daily between meals.      Marland Kitchen ipratropium-albuterol (DUONEB) 0.5-2.5 (3) MG/3ML SOLN Take 3 mLs by nebulization.      Marland Kitchen levothyroxine (SYNTHROID, LEVOTHROID) 125 MCG tablet Take 1 tablet (125 mcg total) by mouth daily.  90 tablet  3  . losartan (COZAAR) 100 MG tablet Take 1 tablet (100 mg total) by  mouth daily.  90 tablet  3  . Multiple Vitamin (MULTIVITAMIN WITH MINERALS) TABS Take 1 tablet by mouth daily. Centrum silver      . polyvinyl alcohol (ARTIFICIAL TEARS) 1.4 % ophthalmic solution Place 1 drop into both eyes 2 (two) times daily.      . pravastatin (PRAVACHOL) 40 MG tablet Take 1 tablet (40 mg total) by mouth daily.  90 tablet  3   No current facility-administered medications on file prior to visit.   Review of Systems  Constitutional: Negative for unexpected weight change, or unusual diaphoresis  HENT: Negative for tinnitus.   Eyes: Negative for photophobia and visual disturbance.  Respiratory: Negative for choking and stridor.   Gastrointestinal: Negative for vomiting and blood in stool.  Genitourinary: Negative for hematuria and decreased urine volume.  Musculoskeletal: Negative for acute joint swelling Skin: Negative for color change and wound.  Neurological: Negative for tremors and numbness other than noted  Psychiatric/Behavioral: Negative for decreased concentration or  hyperactivity.       Objective:   Physical Exam BP 118/70  Pulse 93  Temp(Src) 98.1 F (36.7 C) (Oral)  Wt 124 lb 4 oz (56.359 kg)  SpO2 97% VS noted, not ill appearing Constitutional: Pt appears well-developed and well-nourished. but on the thin side (just less so from last visit) HENT: Head: NCAT.  Right Ear: External ear normal.  Left Ear: External ear normal.  Eyes: Conjunctivae and EOM are normal. Pupils are equal, round, and reactive to light.  Neck: Normal range of motion. Neck supple.  Cardiovascular: Normal rate and regular rhythm.   Pulmonary/Chest: Effort normal and breath sounds normal.  Neurological: Pt is alert. Skin: Skin is warm. No erythema.  Psychiatric: Pt behavior is normal     Assessment & Plan:

## 2013-10-24 NOTE — Assessment & Plan Note (Signed)
Improved, no furhter eval or tx needed, ok to d/c the duonebs as well

## 2013-10-24 NOTE — Assessment & Plan Note (Signed)
stable overall by history and exam, recent data reviewed with pt, and pt to continue medical treatment as before,  to f/u any worsening symptoms or concerns Lab Results  Component Value Date   HGBA1C 6.2* 09/26/2012    

## 2013-10-24 NOTE — Progress Notes (Signed)
Pre-visit discussion using our clinic review tool. No additional management support is needed unless otherwise documented below in the visit note.  

## 2013-10-25 DIAGNOSIS — R269 Unspecified abnormalities of gait and mobility: Secondary | ICD-10-CM | POA: Diagnosis not present

## 2013-10-25 DIAGNOSIS — R4182 Altered mental status, unspecified: Secondary | ICD-10-CM | POA: Diagnosis not present

## 2013-10-25 DIAGNOSIS — F0280 Dementia in other diseases classified elsewhere without behavioral disturbance: Secondary | ICD-10-CM | POA: Diagnosis not present

## 2013-10-25 DIAGNOSIS — IMO0001 Reserved for inherently not codable concepts without codable children: Secondary | ICD-10-CM | POA: Diagnosis not present

## 2013-10-25 DIAGNOSIS — M6281 Muscle weakness (generalized): Secondary | ICD-10-CM | POA: Diagnosis not present

## 2013-10-25 DIAGNOSIS — Z96649 Presence of unspecified artificial hip joint: Secondary | ICD-10-CM | POA: Diagnosis not present

## 2013-10-28 ENCOUNTER — Encounter: Payer: Self-pay | Admitting: Internal Medicine

## 2013-10-28 ENCOUNTER — Ambulatory Visit (INDEPENDENT_AMBULATORY_CARE_PROVIDER_SITE_OTHER): Payer: Medicare Other | Admitting: *Deleted

## 2013-10-28 DIAGNOSIS — I442 Atrioventricular block, complete: Secondary | ICD-10-CM | POA: Diagnosis not present

## 2013-10-28 DIAGNOSIS — F0280 Dementia in other diseases classified elsewhere without behavioral disturbance: Secondary | ICD-10-CM | POA: Diagnosis not present

## 2013-10-28 DIAGNOSIS — Z96649 Presence of unspecified artificial hip joint: Secondary | ICD-10-CM | POA: Diagnosis not present

## 2013-10-28 DIAGNOSIS — IMO0001 Reserved for inherently not codable concepts without codable children: Secondary | ICD-10-CM | POA: Diagnosis not present

## 2013-10-28 DIAGNOSIS — R4182 Altered mental status, unspecified: Secondary | ICD-10-CM | POA: Diagnosis not present

## 2013-10-28 DIAGNOSIS — M6281 Muscle weakness (generalized): Secondary | ICD-10-CM | POA: Diagnosis not present

## 2013-10-28 DIAGNOSIS — I4891 Unspecified atrial fibrillation: Secondary | ICD-10-CM | POA: Diagnosis not present

## 2013-10-28 DIAGNOSIS — R269 Unspecified abnormalities of gait and mobility: Secondary | ICD-10-CM | POA: Diagnosis not present

## 2013-10-29 DIAGNOSIS — M6281 Muscle weakness (generalized): Secondary | ICD-10-CM | POA: Diagnosis not present

## 2013-10-29 DIAGNOSIS — R4182 Altered mental status, unspecified: Secondary | ICD-10-CM | POA: Diagnosis not present

## 2013-10-29 DIAGNOSIS — F0280 Dementia in other diseases classified elsewhere without behavioral disturbance: Secondary | ICD-10-CM | POA: Diagnosis not present

## 2013-10-29 DIAGNOSIS — IMO0001 Reserved for inherently not codable concepts without codable children: Secondary | ICD-10-CM | POA: Diagnosis not present

## 2013-10-29 DIAGNOSIS — Z96649 Presence of unspecified artificial hip joint: Secondary | ICD-10-CM | POA: Diagnosis not present

## 2013-10-29 DIAGNOSIS — R269 Unspecified abnormalities of gait and mobility: Secondary | ICD-10-CM | POA: Diagnosis not present

## 2013-10-30 DIAGNOSIS — M6281 Muscle weakness (generalized): Secondary | ICD-10-CM | POA: Diagnosis not present

## 2013-10-30 DIAGNOSIS — Z96649 Presence of unspecified artificial hip joint: Secondary | ICD-10-CM | POA: Diagnosis not present

## 2013-10-30 DIAGNOSIS — R4182 Altered mental status, unspecified: Secondary | ICD-10-CM | POA: Diagnosis not present

## 2013-10-30 DIAGNOSIS — R269 Unspecified abnormalities of gait and mobility: Secondary | ICD-10-CM | POA: Diagnosis not present

## 2013-10-30 DIAGNOSIS — F0280 Dementia in other diseases classified elsewhere without behavioral disturbance: Secondary | ICD-10-CM | POA: Diagnosis not present

## 2013-10-30 DIAGNOSIS — IMO0001 Reserved for inherently not codable concepts without codable children: Secondary | ICD-10-CM | POA: Diagnosis not present

## 2013-10-31 DIAGNOSIS — M6281 Muscle weakness (generalized): Secondary | ICD-10-CM | POA: Diagnosis not present

## 2013-10-31 DIAGNOSIS — IMO0001 Reserved for inherently not codable concepts without codable children: Secondary | ICD-10-CM | POA: Diagnosis not present

## 2013-10-31 DIAGNOSIS — Z96649 Presence of unspecified artificial hip joint: Secondary | ICD-10-CM | POA: Diagnosis not present

## 2013-10-31 DIAGNOSIS — F0280 Dementia in other diseases classified elsewhere without behavioral disturbance: Secondary | ICD-10-CM | POA: Diagnosis not present

## 2013-10-31 DIAGNOSIS — R269 Unspecified abnormalities of gait and mobility: Secondary | ICD-10-CM | POA: Diagnosis not present

## 2013-10-31 DIAGNOSIS — R4182 Altered mental status, unspecified: Secondary | ICD-10-CM | POA: Diagnosis not present

## 2013-11-01 DIAGNOSIS — Z96649 Presence of unspecified artificial hip joint: Secondary | ICD-10-CM | POA: Diagnosis not present

## 2013-11-01 DIAGNOSIS — F0280 Dementia in other diseases classified elsewhere without behavioral disturbance: Secondary | ICD-10-CM | POA: Diagnosis not present

## 2013-11-01 DIAGNOSIS — R269 Unspecified abnormalities of gait and mobility: Secondary | ICD-10-CM | POA: Diagnosis not present

## 2013-11-01 DIAGNOSIS — IMO0001 Reserved for inherently not codable concepts without codable children: Secondary | ICD-10-CM | POA: Diagnosis not present

## 2013-11-01 DIAGNOSIS — M6281 Muscle weakness (generalized): Secondary | ICD-10-CM | POA: Diagnosis not present

## 2013-11-01 DIAGNOSIS — R4182 Altered mental status, unspecified: Secondary | ICD-10-CM | POA: Diagnosis not present

## 2013-11-04 DIAGNOSIS — Z96649 Presence of unspecified artificial hip joint: Secondary | ICD-10-CM | POA: Diagnosis not present

## 2013-11-04 DIAGNOSIS — IMO0001 Reserved for inherently not codable concepts without codable children: Secondary | ICD-10-CM | POA: Diagnosis not present

## 2013-11-04 DIAGNOSIS — R4182 Altered mental status, unspecified: Secondary | ICD-10-CM | POA: Diagnosis not present

## 2013-11-04 DIAGNOSIS — R269 Unspecified abnormalities of gait and mobility: Secondary | ICD-10-CM | POA: Diagnosis not present

## 2013-11-04 DIAGNOSIS — M6281 Muscle weakness (generalized): Secondary | ICD-10-CM | POA: Diagnosis not present

## 2013-11-04 DIAGNOSIS — F0280 Dementia in other diseases classified elsewhere without behavioral disturbance: Secondary | ICD-10-CM | POA: Diagnosis not present

## 2013-11-05 ENCOUNTER — Encounter: Payer: Self-pay | Admitting: *Deleted

## 2013-11-05 DIAGNOSIS — R4182 Altered mental status, unspecified: Secondary | ICD-10-CM | POA: Diagnosis not present

## 2013-11-05 DIAGNOSIS — R269 Unspecified abnormalities of gait and mobility: Secondary | ICD-10-CM | POA: Diagnosis not present

## 2013-11-05 DIAGNOSIS — IMO0001 Reserved for inherently not codable concepts without codable children: Secondary | ICD-10-CM | POA: Diagnosis not present

## 2013-11-05 DIAGNOSIS — Z96649 Presence of unspecified artificial hip joint: Secondary | ICD-10-CM | POA: Diagnosis not present

## 2013-11-05 DIAGNOSIS — M6281 Muscle weakness (generalized): Secondary | ICD-10-CM | POA: Diagnosis not present

## 2013-11-05 DIAGNOSIS — F0280 Dementia in other diseases classified elsewhere without behavioral disturbance: Secondary | ICD-10-CM | POA: Diagnosis not present

## 2013-11-06 DIAGNOSIS — F0280 Dementia in other diseases classified elsewhere without behavioral disturbance: Secondary | ICD-10-CM | POA: Diagnosis not present

## 2013-11-06 DIAGNOSIS — R4182 Altered mental status, unspecified: Secondary | ICD-10-CM | POA: Diagnosis not present

## 2013-11-06 DIAGNOSIS — M6281 Muscle weakness (generalized): Secondary | ICD-10-CM | POA: Diagnosis not present

## 2013-11-06 DIAGNOSIS — Z96649 Presence of unspecified artificial hip joint: Secondary | ICD-10-CM | POA: Diagnosis not present

## 2013-11-06 DIAGNOSIS — IMO0001 Reserved for inherently not codable concepts without codable children: Secondary | ICD-10-CM | POA: Diagnosis not present

## 2013-11-06 DIAGNOSIS — R269 Unspecified abnormalities of gait and mobility: Secondary | ICD-10-CM | POA: Diagnosis not present

## 2013-11-07 DIAGNOSIS — M6281 Muscle weakness (generalized): Secondary | ICD-10-CM | POA: Diagnosis not present

## 2013-11-07 DIAGNOSIS — F0280 Dementia in other diseases classified elsewhere without behavioral disturbance: Secondary | ICD-10-CM | POA: Diagnosis not present

## 2013-11-07 DIAGNOSIS — R269 Unspecified abnormalities of gait and mobility: Secondary | ICD-10-CM | POA: Diagnosis not present

## 2013-11-07 DIAGNOSIS — R4182 Altered mental status, unspecified: Secondary | ICD-10-CM | POA: Diagnosis not present

## 2013-11-07 DIAGNOSIS — IMO0001 Reserved for inherently not codable concepts without codable children: Secondary | ICD-10-CM | POA: Diagnosis not present

## 2013-11-07 DIAGNOSIS — Z96649 Presence of unspecified artificial hip joint: Secondary | ICD-10-CM | POA: Diagnosis not present

## 2013-11-08 DIAGNOSIS — M6281 Muscle weakness (generalized): Secondary | ICD-10-CM | POA: Diagnosis not present

## 2013-11-08 DIAGNOSIS — IMO0001 Reserved for inherently not codable concepts without codable children: Secondary | ICD-10-CM | POA: Diagnosis not present

## 2013-11-08 DIAGNOSIS — F0280 Dementia in other diseases classified elsewhere without behavioral disturbance: Secondary | ICD-10-CM | POA: Diagnosis not present

## 2013-11-08 DIAGNOSIS — Z96649 Presence of unspecified artificial hip joint: Secondary | ICD-10-CM | POA: Diagnosis not present

## 2013-11-08 DIAGNOSIS — R4182 Altered mental status, unspecified: Secondary | ICD-10-CM | POA: Diagnosis not present

## 2013-11-08 DIAGNOSIS — R269 Unspecified abnormalities of gait and mobility: Secondary | ICD-10-CM | POA: Diagnosis not present

## 2013-11-11 ENCOUNTER — Telehealth: Payer: Self-pay | Admitting: *Deleted

## 2013-11-11 DIAGNOSIS — R4182 Altered mental status, unspecified: Secondary | ICD-10-CM | POA: Diagnosis not present

## 2013-11-11 DIAGNOSIS — Z96649 Presence of unspecified artificial hip joint: Secondary | ICD-10-CM | POA: Diagnosis not present

## 2013-11-11 DIAGNOSIS — IMO0001 Reserved for inherently not codable concepts without codable children: Secondary | ICD-10-CM | POA: Diagnosis not present

## 2013-11-11 DIAGNOSIS — R269 Unspecified abnormalities of gait and mobility: Secondary | ICD-10-CM | POA: Diagnosis not present

## 2013-11-11 DIAGNOSIS — M6281 Muscle weakness (generalized): Secondary | ICD-10-CM | POA: Diagnosis not present

## 2013-11-11 DIAGNOSIS — F0280 Dementia in other diseases classified elsewhere without behavioral disturbance: Secondary | ICD-10-CM | POA: Diagnosis not present

## 2013-11-11 NOTE — Telephone Encounter (Signed)
Ok for verbal 

## 2013-11-11 NOTE — Telephone Encounter (Signed)
Jerolyn Shin, PT with [facility], phoned requesting continued physical therapy orders for patient for 2/week for another week-upper extremity strengthening, gait, balance, and transfers.  Please advise.   CB# [phone]

## 2013-11-13 DIAGNOSIS — Z96649 Presence of unspecified artificial hip joint: Secondary | ICD-10-CM | POA: Diagnosis not present

## 2013-11-13 DIAGNOSIS — M6281 Muscle weakness (generalized): Secondary | ICD-10-CM | POA: Diagnosis not present

## 2013-11-13 DIAGNOSIS — F0280 Dementia in other diseases classified elsewhere without behavioral disturbance: Secondary | ICD-10-CM | POA: Diagnosis not present

## 2013-11-13 DIAGNOSIS — R269 Unspecified abnormalities of gait and mobility: Secondary | ICD-10-CM | POA: Diagnosis not present

## 2013-11-13 DIAGNOSIS — IMO0001 Reserved for inherently not codable concepts without codable children: Secondary | ICD-10-CM | POA: Diagnosis not present

## 2013-11-13 DIAGNOSIS — R4182 Altered mental status, unspecified: Secondary | ICD-10-CM | POA: Diagnosis not present

## 2013-11-13 LAB — MDC_IDC_ENUM_SESS_TYPE_REMOTE
Brady Statistic AP VP Percent: 27.5 %
Brady Statistic AP VS Percent: 0.2 %
Brady Statistic AS VP Percent: 72.1 %
Brady Statistic AS VS Percent: 0.2 %
Lead Channel Impedance Value: 598 Ohm
Lead Channel Pacing Threshold Amplitude: 0.625 V
Lead Channel Sensing Intrinsic Amplitude: 2.8 mV
Lead Channel Setting Pacing Amplitude: 2 V
Lead Channel Setting Pacing Amplitude: 2.5 V
Lead Channel Setting Pacing Pulse Width: 0.4 ms
Lead Channel Setting Sensing Sensitivity: 4 mV
MDC IDC MSMT LEADCHNL RA PACING THRESHOLD AMPLITUDE: 0.5 V
MDC IDC MSMT LEADCHNL RA PACING THRESHOLD PULSEWIDTH: 0.4 ms
MDC IDC MSMT LEADCHNL RV IMPEDANCE VALUE: 548 Ohm
MDC IDC MSMT LEADCHNL RV PACING THRESHOLD PULSEWIDTH: 0.4 ms

## 2013-11-13 NOTE — Telephone Encounter (Signed)
HHRN informed 

## 2013-11-14 DIAGNOSIS — M6281 Muscle weakness (generalized): Secondary | ICD-10-CM | POA: Diagnosis not present

## 2013-11-14 DIAGNOSIS — F0280 Dementia in other diseases classified elsewhere without behavioral disturbance: Secondary | ICD-10-CM | POA: Diagnosis not present

## 2013-11-14 DIAGNOSIS — Z96649 Presence of unspecified artificial hip joint: Secondary | ICD-10-CM | POA: Diagnosis not present

## 2013-11-14 DIAGNOSIS — R4182 Altered mental status, unspecified: Secondary | ICD-10-CM | POA: Diagnosis not present

## 2013-11-14 DIAGNOSIS — R269 Unspecified abnormalities of gait and mobility: Secondary | ICD-10-CM | POA: Diagnosis not present

## 2013-11-14 DIAGNOSIS — IMO0001 Reserved for inherently not codable concepts without codable children: Secondary | ICD-10-CM | POA: Diagnosis not present

## 2013-11-15 DIAGNOSIS — R269 Unspecified abnormalities of gait and mobility: Secondary | ICD-10-CM | POA: Diagnosis not present

## 2013-11-15 DIAGNOSIS — M6281 Muscle weakness (generalized): Secondary | ICD-10-CM | POA: Diagnosis not present

## 2013-11-15 DIAGNOSIS — Z96649 Presence of unspecified artificial hip joint: Secondary | ICD-10-CM | POA: Diagnosis not present

## 2013-11-15 DIAGNOSIS — IMO0001 Reserved for inherently not codable concepts without codable children: Secondary | ICD-10-CM | POA: Diagnosis not present

## 2013-11-15 DIAGNOSIS — R4182 Altered mental status, unspecified: Secondary | ICD-10-CM | POA: Diagnosis not present

## 2013-11-15 DIAGNOSIS — F0280 Dementia in other diseases classified elsewhere without behavioral disturbance: Secondary | ICD-10-CM | POA: Diagnosis not present

## 2013-11-18 DIAGNOSIS — R279 Unspecified lack of coordination: Secondary | ICD-10-CM | POA: Diagnosis not present

## 2013-11-18 DIAGNOSIS — M6281 Muscle weakness (generalized): Secondary | ICD-10-CM | POA: Diagnosis not present

## 2013-11-18 DIAGNOSIS — R269 Unspecified abnormalities of gait and mobility: Secondary | ICD-10-CM | POA: Diagnosis not present

## 2013-11-19 ENCOUNTER — Encounter: Payer: Medicare Other | Admitting: Internal Medicine

## 2013-11-19 DIAGNOSIS — M6281 Muscle weakness (generalized): Secondary | ICD-10-CM | POA: Diagnosis not present

## 2013-11-19 DIAGNOSIS — R269 Unspecified abnormalities of gait and mobility: Secondary | ICD-10-CM | POA: Diagnosis not present

## 2013-11-19 DIAGNOSIS — R279 Unspecified lack of coordination: Secondary | ICD-10-CM | POA: Diagnosis not present

## 2013-11-23 DIAGNOSIS — R279 Unspecified lack of coordination: Secondary | ICD-10-CM | POA: Diagnosis not present

## 2013-11-23 DIAGNOSIS — M6281 Muscle weakness (generalized): Secondary | ICD-10-CM | POA: Diagnosis not present

## 2013-11-23 DIAGNOSIS — R269 Unspecified abnormalities of gait and mobility: Secondary | ICD-10-CM | POA: Diagnosis not present

## 2013-11-25 DIAGNOSIS — M6281 Muscle weakness (generalized): Secondary | ICD-10-CM | POA: Diagnosis not present

## 2013-11-25 DIAGNOSIS — I69928 Other speech and language deficits following unspecified cerebrovascular disease: Secondary | ICD-10-CM | POA: Diagnosis not present

## 2013-11-25 DIAGNOSIS — R269 Unspecified abnormalities of gait and mobility: Secondary | ICD-10-CM | POA: Diagnosis not present

## 2013-11-25 DIAGNOSIS — R279 Unspecified lack of coordination: Secondary | ICD-10-CM | POA: Diagnosis not present

## 2013-11-25 DIAGNOSIS — I6350 Cerebral infarction due to unspecified occlusion or stenosis of unspecified cerebral artery: Secondary | ICD-10-CM | POA: Diagnosis not present

## 2013-11-25 DIAGNOSIS — I69991 Dysphagia following unspecified cerebrovascular disease: Secondary | ICD-10-CM | POA: Diagnosis not present

## 2013-11-26 DIAGNOSIS — I69991 Dysphagia following unspecified cerebrovascular disease: Secondary | ICD-10-CM | POA: Diagnosis not present

## 2013-11-26 DIAGNOSIS — I69928 Other speech and language deficits following unspecified cerebrovascular disease: Secondary | ICD-10-CM | POA: Diagnosis not present

## 2013-11-26 DIAGNOSIS — M6281 Muscle weakness (generalized): Secondary | ICD-10-CM | POA: Diagnosis not present

## 2013-11-26 DIAGNOSIS — R279 Unspecified lack of coordination: Secondary | ICD-10-CM | POA: Diagnosis not present

## 2013-11-26 DIAGNOSIS — I6350 Cerebral infarction due to unspecified occlusion or stenosis of unspecified cerebral artery: Secondary | ICD-10-CM | POA: Diagnosis not present

## 2013-11-26 DIAGNOSIS — R269 Unspecified abnormalities of gait and mobility: Secondary | ICD-10-CM | POA: Diagnosis not present

## 2013-11-27 DIAGNOSIS — M6281 Muscle weakness (generalized): Secondary | ICD-10-CM | POA: Diagnosis not present

## 2013-11-27 DIAGNOSIS — I69928 Other speech and language deficits following unspecified cerebrovascular disease: Secondary | ICD-10-CM | POA: Diagnosis not present

## 2013-11-27 DIAGNOSIS — R279 Unspecified lack of coordination: Secondary | ICD-10-CM | POA: Diagnosis not present

## 2013-11-27 DIAGNOSIS — R269 Unspecified abnormalities of gait and mobility: Secondary | ICD-10-CM | POA: Diagnosis not present

## 2013-11-27 DIAGNOSIS — I69991 Dysphagia following unspecified cerebrovascular disease: Secondary | ICD-10-CM | POA: Diagnosis not present

## 2013-11-27 DIAGNOSIS — I6350 Cerebral infarction due to unspecified occlusion or stenosis of unspecified cerebral artery: Secondary | ICD-10-CM | POA: Diagnosis not present

## 2013-11-28 ENCOUNTER — Encounter: Payer: Self-pay | Admitting: Internal Medicine

## 2013-11-28 ENCOUNTER — Ambulatory Visit (INDEPENDENT_AMBULATORY_CARE_PROVIDER_SITE_OTHER): Payer: Medicare Other | Admitting: Internal Medicine

## 2013-11-28 VITALS — BP 138/74 | HR 88 | Ht 65.0 in | Wt 134.0 lb

## 2013-11-28 DIAGNOSIS — Z950 Presence of cardiac pacemaker: Secondary | ICD-10-CM | POA: Diagnosis not present

## 2013-11-28 DIAGNOSIS — I4891 Unspecified atrial fibrillation: Secondary | ICD-10-CM | POA: Diagnosis not present

## 2013-11-28 DIAGNOSIS — I442 Atrioventricular block, complete: Secondary | ICD-10-CM | POA: Diagnosis not present

## 2013-11-28 LAB — MDC_IDC_ENUM_SESS_TYPE_INCLINIC
Battery Impedance: 553 Ohm
Battery Remaining Longevity: 71 mo
Battery Voltage: 2.79 V
Brady Statistic AP VP Percent: 28 %
Brady Statistic AP VS Percent: 0 %
Brady Statistic AS VP Percent: 72 %
Lead Channel Impedance Value: 618 Ohm
Lead Channel Pacing Threshold Amplitude: 0.5 V
Lead Channel Sensing Intrinsic Amplitude: 4 mV
Lead Channel Setting Pacing Amplitude: 2 V
Lead Channel Setting Pacing Amplitude: 2.5 V
Lead Channel Setting Pacing Pulse Width: 0.4 ms
MDC IDC MSMT LEADCHNL RA PACING THRESHOLD PULSEWIDTH: 0.4 ms
MDC IDC MSMT LEADCHNL RV IMPEDANCE VALUE: 529 Ohm
MDC IDC MSMT LEADCHNL RV PACING THRESHOLD AMPLITUDE: 0.5 V
MDC IDC MSMT LEADCHNL RV PACING THRESHOLD PULSEWIDTH: 0.4 ms
MDC IDC SESS DTM: 20150305112339
MDC IDC SET LEADCHNL RV SENSING SENSITIVITY: 4 mV
MDC IDC STAT BRADY AS VS PERCENT: 0 %

## 2013-11-28 NOTE — Assessment & Plan Note (Signed)
Her PPM is working normally. Will plan to recheck in several months.

## 2013-11-28 NOTE — Progress Notes (Signed)
HPI [name] returns today for followup. She has a history of complete heart block, status post permanent pacemaker insertion. She is a history of cerebral aneurysm and underwent surgical clipping approximately 20 years ago. The procedure was complicated by large stroke. Since then she has had multiple recurrent strokes. She has had a history of atrial fibrillation documented on her pacemaker with pacemaker interrogations. In the past year, she has survived emergent hernia surgery (strangulation), and GYN surgery, and a broken back, treated conservatively. She has had several fall and she does have baseline difficulty with speech and gait and balance. She denies chest pain, shortness of breath, or peripheral edema. Allergies  Allergen Reactions  . Codeine     unknown     Current Outpatient Prescriptions  Medication Sig Dispense Refill  . amLODipine (NORVASC) 10 MG tablet Take 1 tablet (10 mg total) by mouth daily. Resume in 3 days if SBP > 150  90 tablet  3  . Calcium Carbonate-Vitamin D (CALTRATE 600+D) 600-400 MG-UNIT per tablet Take 1 tablet by mouth daily.        Marland Kitchen dipyridamole-aspirin (AGGRENOX) 200-25 MG per 12 hr capsule Take 1 capsule by mouth 2 (two) times daily.  180 capsule  3  . Docusate Sodium (DSS) 100 MG CAPS Take 100 mg by mouth daily.      Marland Kitchen donepezil (ARICEPT) 10 MG tablet Take 1 tablet (10 mg total) by mouth daily.  90 tablet  3  . feeding supplement, ENSURE COMPLETE, (ENSURE COMPLETE) LIQD Take 237 mLs by mouth 2 (two) times daily between meals.      Marland Kitchen levothyroxine (SYNTHROID, LEVOTHROID) 125 MCG tablet Take 1 tablet (125 mcg total) by mouth daily.  90 tablet  3  . losartan (COZAAR) 100 MG tablet Take 1 tablet (100 mg total) by mouth daily.  90 tablet  3  . Multiple Vitamin (MULTIVITAMIN WITH MINERALS) TABS Take 1 tablet by mouth daily. Centrum silver      . polyvinyl alcohol (ARTIFICIAL TEARS) 1.4 % ophthalmic solution Place 1 drop into both eyes 2 (two) times daily.       . pravastatin (PRAVACHOL) 40 MG tablet Take 1 tablet (40 mg total) by mouth daily.  90 tablet  3   No current facility-administered medications for this visit.     Past Medical History  Diagnosis Date  . Aphasia 01/09/2009  . 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 CEREBROVASCULAR ACCIDENT, HX OF 11/20/2007  . CVA 01/09/2009  . DEMENTIA 05/19/2008  . DIVERTICULOSIS, COLON 11/20/2007  . FATIGUE 11/20/2007  . GLUCOSE INTOLERANCE 12/06/2010  . HYPERLIPIDEMIA 11/20/2007  . HYPERTENSION 11/20/2007    Echo was done 07/07/11: mod LVH, EF 55-60%, grade 1 diast dysfxn, mild MR, mild LAE, mod TR, PASP 39  . HYPOTHYROIDISM 11/20/2007  . IRON DEFICIENCY 12/06/2010  . PACEMAKER, PERMANENT 01/09/2009  . TIA 05/14/2003    elevated CE's in setting of TIA and falls in 10/12 - echo normal with no further cardiac w/u pursued  . Urinary incontinence 01/09/2009  . Impaired glucose tolerance 06/03/2011  . TIA (transient ischemic attack) 10/10/2011  . Dementia   . CHF (congestive heart failure)   . Coronary artery disease   . Arthritis     right hand  . Pacemaker 2002  . Femoral fracture 07/01/2013    ROS:   All systems reviewed and negative except as noted in the HPI.   Past Surgical History  Procedure Laterality Date  . Medtronic cynthia dual chamber pacemaker serial  number was [id number]...04/15/2009    . Abdominal hysterectomy    . S/p cerebral aneurysm  1991  . S/p pacemaker ddd    . Cholecystectomy    . S/p retinal attachment    . S/p thyroidectomy    . Cystocele repair N/A 11/26/2012    Procedure: Cystocele Repair with Lefort Colpocleisis.  colpectomy Levatorplasty Perineorraphy;  Surgeon: Lovenia Kim, MD;  Location: [facility];  Service: Gynecology;  Laterality: N/A;  Cystocele Repair with Lefort Colpocleisis.  . Hip arthroplasty Left 07/02/2013    Procedure: ARTHROPLASTY BIPOLAR HIP;  Surgeon: Mauri Pole, MD;  Location: [facility];  Service: Orthopedics;  Laterality: Left;  . Inguinal hernia repair  Right 08/13/2013    Procedure: HERNIA REPAIR INGUINAL INCARCERATED;  Surgeon: Ralene Ok, MD;  Location: [facility];  Service: General;  Laterality: Right;     Family History  Problem Relation Age of Onset  . Cancer Mother     breast cancer     History   Social History  . Marital Status: Divorced    Spouse Name: N/A    Number of Children: N/A  . Years of Education: N/A   Occupational History  . retired Radiation protection practitioner, lives at [place]  . Smoking status: Never Smoker   . Smokeless tobacco: Never Used  . Alcohol Use: No  . Drug Use: No  . Sexual Activity: Not on file   Other Topics Concern  . Not on file   Social History Narrative  . No narrative on file     BP 138/74  Pulse 88  Ht 5\' 5"  (1.651 m)  Wt 134 lb (60.782 kg)  BMI 22.30 kg/m2  Physical Exam:  Elderly appearing, NAD HEENT: Unremarkable Neck:  No JVD, no thyromegally Lungs:  Clear with no wheezes, rales, or rhonchi. HEART:  Regular rate rhythm, no murmurs, no rubs, no clicks Abd:  soft, positive bowel sounds, no organomegally, no rebound, no guarding Ext:  2 plus pulses, no edema, no cyanosis, no clubbing Skin:  No rashes no nodules Neuro:  CN II through XII intact, motor grossly intact, some difficulty with speech   DEVICE  Normal device function.  See PaceArt for details. She has had no ventricular arrhythmias. Her underlying rhythm is complete heart block with a ventricular escape of 40 beats per minute  Assess/Plan:

## 2013-11-28 NOTE — Patient Instructions (Signed)
Your physician wants you to follow-up in: 12 months with Dr Knox Saliva will receive a reminder letter in the mail two months in advance. If you don't receive a letter, please call our office to schedule the follow-up appointment.  Remote monitoring is used to monitor your Pacemaker of ICD from home. This monitoring reduces the number of office visits required to check your device to one time per year. It allows [place] to keep an eye on the functioning of your device to ensure it is working properly. You are scheduled for a device check from home on 02/27/14. You may send your transmission at any time that day. If you have a wireless device, the transmission will be sent automatically. After your physician reviews your transmission, you will receive a postcard with your next transmission date.

## 2013-11-28 NOTE — Assessment & Plan Note (Signed)
Unfortunately, she is not now and will likely never be a candidate for anti-coagulation. Her rate is well controlled with underlying CHB.

## 2013-12-02 DIAGNOSIS — R269 Unspecified abnormalities of gait and mobility: Secondary | ICD-10-CM | POA: Diagnosis not present

## 2013-12-02 DIAGNOSIS — R279 Unspecified lack of coordination: Secondary | ICD-10-CM | POA: Diagnosis not present

## 2013-12-02 DIAGNOSIS — I6350 Cerebral infarction due to unspecified occlusion or stenosis of unspecified cerebral artery: Secondary | ICD-10-CM | POA: Diagnosis not present

## 2013-12-02 DIAGNOSIS — I69928 Other speech and language deficits following unspecified cerebrovascular disease: Secondary | ICD-10-CM | POA: Diagnosis not present

## 2013-12-02 DIAGNOSIS — I69991 Dysphagia following unspecified cerebrovascular disease: Secondary | ICD-10-CM | POA: Diagnosis not present

## 2013-12-02 DIAGNOSIS — M6281 Muscle weakness (generalized): Secondary | ICD-10-CM | POA: Diagnosis not present

## 2013-12-03 DIAGNOSIS — I69928 Other speech and language deficits following unspecified cerebrovascular disease: Secondary | ICD-10-CM | POA: Diagnosis not present

## 2013-12-03 DIAGNOSIS — I69991 Dysphagia following unspecified cerebrovascular disease: Secondary | ICD-10-CM | POA: Diagnosis not present

## 2013-12-03 DIAGNOSIS — R269 Unspecified abnormalities of gait and mobility: Secondary | ICD-10-CM | POA: Diagnosis not present

## 2013-12-03 DIAGNOSIS — I6350 Cerebral infarction due to unspecified occlusion or stenosis of unspecified cerebral artery: Secondary | ICD-10-CM | POA: Diagnosis not present

## 2013-12-03 DIAGNOSIS — R279 Unspecified lack of coordination: Secondary | ICD-10-CM | POA: Diagnosis not present

## 2013-12-03 DIAGNOSIS — M6281 Muscle weakness (generalized): Secondary | ICD-10-CM | POA: Diagnosis not present

## 2013-12-05 DIAGNOSIS — M6281 Muscle weakness (generalized): Secondary | ICD-10-CM | POA: Diagnosis not present

## 2013-12-05 DIAGNOSIS — I69991 Dysphagia following unspecified cerebrovascular disease: Secondary | ICD-10-CM | POA: Diagnosis not present

## 2013-12-05 DIAGNOSIS — R269 Unspecified abnormalities of gait and mobility: Secondary | ICD-10-CM | POA: Diagnosis not present

## 2013-12-05 DIAGNOSIS — R279 Unspecified lack of coordination: Secondary | ICD-10-CM | POA: Diagnosis not present

## 2013-12-05 DIAGNOSIS — I69928 Other speech and language deficits following unspecified cerebrovascular disease: Secondary | ICD-10-CM | POA: Diagnosis not present

## 2013-12-05 DIAGNOSIS — I6350 Cerebral infarction due to unspecified occlusion or stenosis of unspecified cerebral artery: Secondary | ICD-10-CM | POA: Diagnosis not present

## 2013-12-06 DIAGNOSIS — I69991 Dysphagia following unspecified cerebrovascular disease: Secondary | ICD-10-CM | POA: Diagnosis not present

## 2013-12-06 DIAGNOSIS — I69928 Other speech and language deficits following unspecified cerebrovascular disease: Secondary | ICD-10-CM | POA: Diagnosis not present

## 2013-12-06 DIAGNOSIS — M6281 Muscle weakness (generalized): Secondary | ICD-10-CM | POA: Diagnosis not present

## 2013-12-06 DIAGNOSIS — R279 Unspecified lack of coordination: Secondary | ICD-10-CM | POA: Diagnosis not present

## 2013-12-06 DIAGNOSIS — I6350 Cerebral infarction due to unspecified occlusion or stenosis of unspecified cerebral artery: Secondary | ICD-10-CM | POA: Diagnosis not present

## 2013-12-06 DIAGNOSIS — R269 Unspecified abnormalities of gait and mobility: Secondary | ICD-10-CM | POA: Diagnosis not present

## 2013-12-09 DIAGNOSIS — I69991 Dysphagia following unspecified cerebrovascular disease: Secondary | ICD-10-CM | POA: Diagnosis not present

## 2013-12-09 DIAGNOSIS — I6350 Cerebral infarction due to unspecified occlusion or stenosis of unspecified cerebral artery: Secondary | ICD-10-CM | POA: Diagnosis not present

## 2013-12-09 DIAGNOSIS — I69928 Other speech and language deficits following unspecified cerebrovascular disease: Secondary | ICD-10-CM | POA: Diagnosis not present

## 2013-12-09 DIAGNOSIS — M6281 Muscle weakness (generalized): Secondary | ICD-10-CM | POA: Diagnosis not present

## 2013-12-09 DIAGNOSIS — R269 Unspecified abnormalities of gait and mobility: Secondary | ICD-10-CM | POA: Diagnosis not present

## 2013-12-09 DIAGNOSIS — R279 Unspecified lack of coordination: Secondary | ICD-10-CM | POA: Diagnosis not present

## 2013-12-10 DIAGNOSIS — M6281 Muscle weakness (generalized): Secondary | ICD-10-CM | POA: Diagnosis not present

## 2013-12-10 DIAGNOSIS — R269 Unspecified abnormalities of gait and mobility: Secondary | ICD-10-CM | POA: Diagnosis not present

## 2013-12-10 DIAGNOSIS — I69928 Other speech and language deficits following unspecified cerebrovascular disease: Secondary | ICD-10-CM | POA: Diagnosis not present

## 2013-12-10 DIAGNOSIS — R279 Unspecified lack of coordination: Secondary | ICD-10-CM | POA: Diagnosis not present

## 2013-12-10 DIAGNOSIS — I69991 Dysphagia following unspecified cerebrovascular disease: Secondary | ICD-10-CM | POA: Diagnosis not present

## 2013-12-10 DIAGNOSIS — I6350 Cerebral infarction due to unspecified occlusion or stenosis of unspecified cerebral artery: Secondary | ICD-10-CM | POA: Diagnosis not present

## 2013-12-13 DIAGNOSIS — M6281 Muscle weakness (generalized): Secondary | ICD-10-CM | POA: Diagnosis not present

## 2013-12-13 DIAGNOSIS — I69991 Dysphagia following unspecified cerebrovascular disease: Secondary | ICD-10-CM | POA: Diagnosis not present

## 2013-12-13 DIAGNOSIS — I69928 Other speech and language deficits following unspecified cerebrovascular disease: Secondary | ICD-10-CM | POA: Diagnosis not present

## 2013-12-13 DIAGNOSIS — R279 Unspecified lack of coordination: Secondary | ICD-10-CM | POA: Diagnosis not present

## 2013-12-13 DIAGNOSIS — I6350 Cerebral infarction due to unspecified occlusion or stenosis of unspecified cerebral artery: Secondary | ICD-10-CM | POA: Diagnosis not present

## 2013-12-13 DIAGNOSIS — R269 Unspecified abnormalities of gait and mobility: Secondary | ICD-10-CM | POA: Diagnosis not present

## 2013-12-14 DIAGNOSIS — I69991 Dysphagia following unspecified cerebrovascular disease: Secondary | ICD-10-CM | POA: Diagnosis not present

## 2013-12-14 DIAGNOSIS — R269 Unspecified abnormalities of gait and mobility: Secondary | ICD-10-CM | POA: Diagnosis not present

## 2013-12-14 DIAGNOSIS — I6350 Cerebral infarction due to unspecified occlusion or stenosis of unspecified cerebral artery: Secondary | ICD-10-CM | POA: Diagnosis not present

## 2013-12-14 DIAGNOSIS — M6281 Muscle weakness (generalized): Secondary | ICD-10-CM | POA: Diagnosis not present

## 2013-12-14 DIAGNOSIS — I69928 Other speech and language deficits following unspecified cerebrovascular disease: Secondary | ICD-10-CM | POA: Diagnosis not present

## 2013-12-14 DIAGNOSIS — R279 Unspecified lack of coordination: Secondary | ICD-10-CM | POA: Diagnosis not present

## 2013-12-16 DIAGNOSIS — I69928 Other speech and language deficits following unspecified cerebrovascular disease: Secondary | ICD-10-CM | POA: Diagnosis not present

## 2013-12-16 DIAGNOSIS — R269 Unspecified abnormalities of gait and mobility: Secondary | ICD-10-CM | POA: Diagnosis not present

## 2013-12-16 DIAGNOSIS — I6350 Cerebral infarction due to unspecified occlusion or stenosis of unspecified cerebral artery: Secondary | ICD-10-CM | POA: Diagnosis not present

## 2013-12-16 DIAGNOSIS — M6281 Muscle weakness (generalized): Secondary | ICD-10-CM | POA: Diagnosis not present

## 2013-12-16 DIAGNOSIS — R279 Unspecified lack of coordination: Secondary | ICD-10-CM | POA: Diagnosis not present

## 2013-12-16 DIAGNOSIS — I69991 Dysphagia following unspecified cerebrovascular disease: Secondary | ICD-10-CM | POA: Diagnosis not present

## 2013-12-17 DIAGNOSIS — M6281 Muscle weakness (generalized): Secondary | ICD-10-CM | POA: Diagnosis not present

## 2013-12-17 DIAGNOSIS — I69928 Other speech and language deficits following unspecified cerebrovascular disease: Secondary | ICD-10-CM | POA: Diagnosis not present

## 2013-12-17 DIAGNOSIS — I6350 Cerebral infarction due to unspecified occlusion or stenosis of unspecified cerebral artery: Secondary | ICD-10-CM | POA: Diagnosis not present

## 2013-12-17 DIAGNOSIS — R279 Unspecified lack of coordination: Secondary | ICD-10-CM | POA: Diagnosis not present

## 2013-12-17 DIAGNOSIS — R269 Unspecified abnormalities of gait and mobility: Secondary | ICD-10-CM | POA: Diagnosis not present

## 2013-12-17 DIAGNOSIS — I69991 Dysphagia following unspecified cerebrovascular disease: Secondary | ICD-10-CM | POA: Diagnosis not present

## 2013-12-18 DIAGNOSIS — R279 Unspecified lack of coordination: Secondary | ICD-10-CM | POA: Diagnosis not present

## 2013-12-18 DIAGNOSIS — I6350 Cerebral infarction due to unspecified occlusion or stenosis of unspecified cerebral artery: Secondary | ICD-10-CM | POA: Diagnosis not present

## 2013-12-18 DIAGNOSIS — I69928 Other speech and language deficits following unspecified cerebrovascular disease: Secondary | ICD-10-CM | POA: Diagnosis not present

## 2013-12-18 DIAGNOSIS — M6281 Muscle weakness (generalized): Secondary | ICD-10-CM | POA: Diagnosis not present

## 2013-12-18 DIAGNOSIS — R269 Unspecified abnormalities of gait and mobility: Secondary | ICD-10-CM | POA: Diagnosis not present

## 2013-12-18 DIAGNOSIS — I69991 Dysphagia following unspecified cerebrovascular disease: Secondary | ICD-10-CM | POA: Diagnosis not present

## 2013-12-19 DIAGNOSIS — R279 Unspecified lack of coordination: Secondary | ICD-10-CM | POA: Diagnosis not present

## 2013-12-19 DIAGNOSIS — I6350 Cerebral infarction due to unspecified occlusion or stenosis of unspecified cerebral artery: Secondary | ICD-10-CM | POA: Diagnosis not present

## 2013-12-19 DIAGNOSIS — I69991 Dysphagia following unspecified cerebrovascular disease: Secondary | ICD-10-CM | POA: Diagnosis not present

## 2013-12-19 DIAGNOSIS — I69928 Other speech and language deficits following unspecified cerebrovascular disease: Secondary | ICD-10-CM | POA: Diagnosis not present

## 2013-12-19 DIAGNOSIS — M6281 Muscle weakness (generalized): Secondary | ICD-10-CM | POA: Diagnosis not present

## 2013-12-19 DIAGNOSIS — R269 Unspecified abnormalities of gait and mobility: Secondary | ICD-10-CM | POA: Diagnosis not present

## 2013-12-20 DIAGNOSIS — H905 Unspecified sensorineural hearing loss: Secondary | ICD-10-CM | POA: Diagnosis not present

## 2013-12-20 DIAGNOSIS — R279 Unspecified lack of coordination: Secondary | ICD-10-CM | POA: Diagnosis not present

## 2013-12-20 DIAGNOSIS — I69991 Dysphagia following unspecified cerebrovascular disease: Secondary | ICD-10-CM | POA: Diagnosis not present

## 2013-12-20 DIAGNOSIS — I6350 Cerebral infarction due to unspecified occlusion or stenosis of unspecified cerebral artery: Secondary | ICD-10-CM | POA: Diagnosis not present

## 2013-12-20 DIAGNOSIS — R269 Unspecified abnormalities of gait and mobility: Secondary | ICD-10-CM | POA: Diagnosis not present

## 2013-12-20 DIAGNOSIS — M6281 Muscle weakness (generalized): Secondary | ICD-10-CM | POA: Diagnosis not present

## 2013-12-20 DIAGNOSIS — I69928 Other speech and language deficits following unspecified cerebrovascular disease: Secondary | ICD-10-CM | POA: Diagnosis not present

## 2013-12-20 DIAGNOSIS — H6120 Impacted cerumen, unspecified ear: Secondary | ICD-10-CM | POA: Diagnosis not present

## 2013-12-23 DIAGNOSIS — I6350 Cerebral infarction due to unspecified occlusion or stenosis of unspecified cerebral artery: Secondary | ICD-10-CM | POA: Diagnosis not present

## 2013-12-23 DIAGNOSIS — I69991 Dysphagia following unspecified cerebrovascular disease: Secondary | ICD-10-CM | POA: Diagnosis not present

## 2013-12-23 DIAGNOSIS — I69928 Other speech and language deficits following unspecified cerebrovascular disease: Secondary | ICD-10-CM | POA: Diagnosis not present

## 2013-12-23 DIAGNOSIS — R269 Unspecified abnormalities of gait and mobility: Secondary | ICD-10-CM | POA: Diagnosis not present

## 2013-12-23 DIAGNOSIS — R279 Unspecified lack of coordination: Secondary | ICD-10-CM | POA: Diagnosis not present

## 2013-12-23 DIAGNOSIS — M6281 Muscle weakness (generalized): Secondary | ICD-10-CM | POA: Diagnosis not present

## 2013-12-24 DIAGNOSIS — M6281 Muscle weakness (generalized): Secondary | ICD-10-CM | POA: Diagnosis not present

## 2013-12-24 DIAGNOSIS — I6350 Cerebral infarction due to unspecified occlusion or stenosis of unspecified cerebral artery: Secondary | ICD-10-CM | POA: Diagnosis not present

## 2013-12-24 DIAGNOSIS — R269 Unspecified abnormalities of gait and mobility: Secondary | ICD-10-CM | POA: Diagnosis not present

## 2013-12-24 DIAGNOSIS — I69928 Other speech and language deficits following unspecified cerebrovascular disease: Secondary | ICD-10-CM | POA: Diagnosis not present

## 2013-12-24 DIAGNOSIS — R279 Unspecified lack of coordination: Secondary | ICD-10-CM | POA: Diagnosis not present

## 2013-12-24 DIAGNOSIS — I69991 Dysphagia following unspecified cerebrovascular disease: Secondary | ICD-10-CM | POA: Diagnosis not present

## 2013-12-25 DIAGNOSIS — R279 Unspecified lack of coordination: Secondary | ICD-10-CM | POA: Diagnosis not present

## 2013-12-25 DIAGNOSIS — I69928 Other speech and language deficits following unspecified cerebrovascular disease: Secondary | ICD-10-CM | POA: Diagnosis not present

## 2013-12-25 DIAGNOSIS — I6350 Cerebral infarction due to unspecified occlusion or stenosis of unspecified cerebral artery: Secondary | ICD-10-CM | POA: Diagnosis not present

## 2013-12-25 DIAGNOSIS — R269 Unspecified abnormalities of gait and mobility: Secondary | ICD-10-CM | POA: Diagnosis not present

## 2013-12-25 DIAGNOSIS — M6281 Muscle weakness (generalized): Secondary | ICD-10-CM | POA: Diagnosis not present

## 2013-12-25 DIAGNOSIS — I69991 Dysphagia following unspecified cerebrovascular disease: Secondary | ICD-10-CM | POA: Diagnosis not present

## 2014-01-07 ENCOUNTER — Telehealth: Payer: Self-pay | Admitting: Internal Medicine

## 2014-01-07 ENCOUNTER — Other Ambulatory Visit: Payer: Self-pay

## 2014-01-07 ENCOUNTER — Encounter: Payer: Self-pay | Admitting: Internal Medicine

## 2014-01-07 MED ORDER — ALPRAZOLAM 0.25 MG PO TABS: ORAL_TABLET | ORAL | Status: DC

## 2014-01-07 NOTE — Telephone Encounter (Signed)
Patients heart rate is 120. Patient is wondering if she should take her to the ER. Please call and advise.

## 2014-01-07 NOTE — Telephone Encounter (Signed)
Done hardcopy to robin  

## 2014-01-07 NOTE — Telephone Encounter (Signed)
Faxed hardcopy for Alprazolam to [place] the daughter in law and informed rx sent in.

## 2014-01-07 NOTE — Telephone Encounter (Signed)
She had a panic attack and she is following up with her PCP to get an anxiety medication.  Her PCP gave her medication

## 2014-01-19 ENCOUNTER — Other Ambulatory Visit: Payer: Self-pay | Admitting: Internal Medicine

## 2014-01-21 NOTE — Telephone Encounter (Signed)
Faxed hardcopy to [place].

## 2014-01-21 NOTE — Telephone Encounter (Signed)
Done hardcopy to robin  

## 2014-01-24 DIAGNOSIS — R279 Unspecified lack of coordination: Secondary | ICD-10-CM | POA: Diagnosis not present

## 2014-01-24 DIAGNOSIS — M6281 Muscle weakness (generalized): Secondary | ICD-10-CM | POA: Diagnosis not present

## 2014-01-24 DIAGNOSIS — R269 Unspecified abnormalities of gait and mobility: Secondary | ICD-10-CM | POA: Diagnosis not present

## 2014-01-27 DIAGNOSIS — M6281 Muscle weakness (generalized): Secondary | ICD-10-CM | POA: Diagnosis not present

## 2014-01-27 DIAGNOSIS — R269 Unspecified abnormalities of gait and mobility: Secondary | ICD-10-CM | POA: Diagnosis not present

## 2014-01-27 DIAGNOSIS — R279 Unspecified lack of coordination: Secondary | ICD-10-CM | POA: Diagnosis not present

## 2014-01-29 DIAGNOSIS — R269 Unspecified abnormalities of gait and mobility: Secondary | ICD-10-CM | POA: Diagnosis not present

## 2014-01-29 DIAGNOSIS — R279 Unspecified lack of coordination: Secondary | ICD-10-CM | POA: Diagnosis not present

## 2014-01-29 DIAGNOSIS — M6281 Muscle weakness (generalized): Secondary | ICD-10-CM | POA: Diagnosis not present

## 2014-01-30 DIAGNOSIS — R279 Unspecified lack of coordination: Secondary | ICD-10-CM | POA: Diagnosis not present

## 2014-01-30 DIAGNOSIS — R269 Unspecified abnormalities of gait and mobility: Secondary | ICD-10-CM | POA: Diagnosis not present

## 2014-01-30 DIAGNOSIS — M6281 Muscle weakness (generalized): Secondary | ICD-10-CM | POA: Diagnosis not present

## 2014-01-31 DIAGNOSIS — R269 Unspecified abnormalities of gait and mobility: Secondary | ICD-10-CM | POA: Diagnosis not present

## 2014-01-31 DIAGNOSIS — R279 Unspecified lack of coordination: Secondary | ICD-10-CM | POA: Diagnosis not present

## 2014-01-31 DIAGNOSIS — M6281 Muscle weakness (generalized): Secondary | ICD-10-CM | POA: Diagnosis not present

## 2014-02-03 DIAGNOSIS — M6281 Muscle weakness (generalized): Secondary | ICD-10-CM | POA: Diagnosis not present

## 2014-02-03 DIAGNOSIS — R279 Unspecified lack of coordination: Secondary | ICD-10-CM | POA: Diagnosis not present

## 2014-02-03 DIAGNOSIS — R269 Unspecified abnormalities of gait and mobility: Secondary | ICD-10-CM | POA: Diagnosis not present

## 2014-02-05 DIAGNOSIS — R279 Unspecified lack of coordination: Secondary | ICD-10-CM | POA: Diagnosis not present

## 2014-02-05 DIAGNOSIS — R269 Unspecified abnormalities of gait and mobility: Secondary | ICD-10-CM | POA: Diagnosis not present

## 2014-02-05 DIAGNOSIS — M6281 Muscle weakness (generalized): Secondary | ICD-10-CM | POA: Diagnosis not present

## 2014-02-06 DIAGNOSIS — M6281 Muscle weakness (generalized): Secondary | ICD-10-CM | POA: Diagnosis not present

## 2014-02-06 DIAGNOSIS — R279 Unspecified lack of coordination: Secondary | ICD-10-CM | POA: Diagnosis not present

## 2014-02-06 DIAGNOSIS — R269 Unspecified abnormalities of gait and mobility: Secondary | ICD-10-CM | POA: Diagnosis not present

## 2014-02-07 DIAGNOSIS — M6281 Muscle weakness (generalized): Secondary | ICD-10-CM | POA: Diagnosis not present

## 2014-02-07 DIAGNOSIS — R269 Unspecified abnormalities of gait and mobility: Secondary | ICD-10-CM | POA: Diagnosis not present

## 2014-02-07 DIAGNOSIS — R279 Unspecified lack of coordination: Secondary | ICD-10-CM | POA: Diagnosis not present

## 2014-02-10 DIAGNOSIS — M6281 Muscle weakness (generalized): Secondary | ICD-10-CM | POA: Diagnosis not present

## 2014-02-10 DIAGNOSIS — R279 Unspecified lack of coordination: Secondary | ICD-10-CM | POA: Diagnosis not present

## 2014-02-10 DIAGNOSIS — R269 Unspecified abnormalities of gait and mobility: Secondary | ICD-10-CM | POA: Diagnosis not present

## 2014-02-12 DIAGNOSIS — R279 Unspecified lack of coordination: Secondary | ICD-10-CM | POA: Diagnosis not present

## 2014-02-12 DIAGNOSIS — R269 Unspecified abnormalities of gait and mobility: Secondary | ICD-10-CM | POA: Diagnosis not present

## 2014-02-12 DIAGNOSIS — M6281 Muscle weakness (generalized): Secondary | ICD-10-CM | POA: Diagnosis not present

## 2014-02-14 DIAGNOSIS — R279 Unspecified lack of coordination: Secondary | ICD-10-CM | POA: Diagnosis not present

## 2014-02-14 DIAGNOSIS — R269 Unspecified abnormalities of gait and mobility: Secondary | ICD-10-CM | POA: Diagnosis not present

## 2014-02-14 DIAGNOSIS — M6281 Muscle weakness (generalized): Secondary | ICD-10-CM | POA: Diagnosis not present

## 2014-02-18 DIAGNOSIS — R279 Unspecified lack of coordination: Secondary | ICD-10-CM | POA: Diagnosis not present

## 2014-02-18 DIAGNOSIS — M6281 Muscle weakness (generalized): Secondary | ICD-10-CM | POA: Diagnosis not present

## 2014-02-18 DIAGNOSIS — R269 Unspecified abnormalities of gait and mobility: Secondary | ICD-10-CM | POA: Diagnosis not present

## 2014-02-20 DIAGNOSIS — M6281 Muscle weakness (generalized): Secondary | ICD-10-CM | POA: Diagnosis not present

## 2014-02-20 DIAGNOSIS — R269 Unspecified abnormalities of gait and mobility: Secondary | ICD-10-CM | POA: Diagnosis not present

## 2014-02-20 DIAGNOSIS — R279 Unspecified lack of coordination: Secondary | ICD-10-CM | POA: Diagnosis not present

## 2014-02-21 DIAGNOSIS — M6281 Muscle weakness (generalized): Secondary | ICD-10-CM | POA: Diagnosis not present

## 2014-02-21 DIAGNOSIS — R269 Unspecified abnormalities of gait and mobility: Secondary | ICD-10-CM | POA: Diagnosis not present

## 2014-02-21 DIAGNOSIS — R279 Unspecified lack of coordination: Secondary | ICD-10-CM | POA: Diagnosis not present

## 2014-02-25 DIAGNOSIS — R279 Unspecified lack of coordination: Secondary | ICD-10-CM | POA: Diagnosis not present

## 2014-02-25 DIAGNOSIS — R269 Unspecified abnormalities of gait and mobility: Secondary | ICD-10-CM | POA: Diagnosis not present

## 2014-02-25 DIAGNOSIS — M6281 Muscle weakness (generalized): Secondary | ICD-10-CM | POA: Diagnosis not present

## 2014-02-27 ENCOUNTER — Ambulatory Visit (INDEPENDENT_AMBULATORY_CARE_PROVIDER_SITE_OTHER): Payer: Medicare Other | Admitting: *Deleted

## 2014-02-27 DIAGNOSIS — I442 Atrioventricular block, complete: Secondary | ICD-10-CM | POA: Diagnosis not present

## 2014-02-27 DIAGNOSIS — I4891 Unspecified atrial fibrillation: Secondary | ICD-10-CM

## 2014-02-28 DIAGNOSIS — R269 Unspecified abnormalities of gait and mobility: Secondary | ICD-10-CM | POA: Diagnosis not present

## 2014-02-28 DIAGNOSIS — M6281 Muscle weakness (generalized): Secondary | ICD-10-CM | POA: Diagnosis not present

## 2014-02-28 DIAGNOSIS — R279 Unspecified lack of coordination: Secondary | ICD-10-CM | POA: Diagnosis not present

## 2014-02-28 NOTE — Progress Notes (Signed)
Remote pacemaker transmission.   

## 2014-03-03 LAB — MDC_IDC_ENUM_SESS_TYPE_REMOTE
Battery Impedance: 628 Ohm
Battery Voltage: 2.78 V
Brady Statistic AP VS Percent: 0 %
Brady Statistic AS VP Percent: 65 %
Brady Statistic AS VS Percent: 0 %
Date Time Interrogation Session: 20150604131218
Lead Channel Impedance Value: 582 Ohm
Lead Channel Pacing Threshold Amplitude: 0.5 V
Lead Channel Pacing Threshold Amplitude: 0.75 V
Lead Channel Pacing Threshold Pulse Width: 0.4 ms
Lead Channel Pacing Threshold Pulse Width: 0.4 ms
Lead Channel Setting Pacing Amplitude: 2.5 V
Lead Channel Setting Pacing Pulse Width: 0.4 ms
Lead Channel Setting Sensing Sensitivity: 4 mV
MDC IDC MSMT BATTERY REMAINING LONGEVITY: 68 mo
MDC IDC MSMT LEADCHNL RA IMPEDANCE VALUE: 616 Ohm
MDC IDC MSMT LEADCHNL RA SENSING INTR AMPL: 2.8 mV
MDC IDC SET LEADCHNL RA PACING AMPLITUDE: 2 V
MDC IDC STAT BRADY AP VP PERCENT: 35 %

## 2014-03-04 DIAGNOSIS — R279 Unspecified lack of coordination: Secondary | ICD-10-CM | POA: Diagnosis not present

## 2014-03-04 DIAGNOSIS — M6281 Muscle weakness (generalized): Secondary | ICD-10-CM | POA: Diagnosis not present

## 2014-03-04 DIAGNOSIS — R269 Unspecified abnormalities of gait and mobility: Secondary | ICD-10-CM | POA: Diagnosis not present

## 2014-03-07 ENCOUNTER — Encounter: Payer: Self-pay | Admitting: Cardiology

## 2014-03-07 DIAGNOSIS — R279 Unspecified lack of coordination: Secondary | ICD-10-CM | POA: Diagnosis not present

## 2014-03-07 DIAGNOSIS — R269 Unspecified abnormalities of gait and mobility: Secondary | ICD-10-CM | POA: Diagnosis not present

## 2014-03-07 DIAGNOSIS — M6281 Muscle weakness (generalized): Secondary | ICD-10-CM | POA: Diagnosis not present

## 2014-03-10 DIAGNOSIS — M6281 Muscle weakness (generalized): Secondary | ICD-10-CM | POA: Diagnosis not present

## 2014-03-10 DIAGNOSIS — R279 Unspecified lack of coordination: Secondary | ICD-10-CM | POA: Diagnosis not present

## 2014-03-10 DIAGNOSIS — R269 Unspecified abnormalities of gait and mobility: Secondary | ICD-10-CM | POA: Diagnosis not present

## 2014-03-11 ENCOUNTER — Encounter: Payer: Self-pay | Admitting: Internal Medicine

## 2014-04-18 ENCOUNTER — Encounter: Payer: Self-pay | Admitting: Internal Medicine

## 2014-04-18 MED ORDER — ALPRAZOLAM 0.25 MG PO TABS: ORAL_TABLET | ORAL | Status: DC

## 2014-04-18 NOTE — Telephone Encounter (Signed)
Done hardcopy to robin  

## 2014-04-22 ENCOUNTER — Ambulatory Visit (INDEPENDENT_AMBULATORY_CARE_PROVIDER_SITE_OTHER): Payer: Medicare Other | Admitting: Internal Medicine

## 2014-04-22 ENCOUNTER — Encounter: Payer: Self-pay | Admitting: Internal Medicine

## 2014-04-22 VITALS — BP 120/80 | HR 86 | Temp 98.2°F | Wt 150.0 lb

## 2014-04-22 DIAGNOSIS — I10 Essential (primary) hypertension: Secondary | ICD-10-CM

## 2014-04-22 DIAGNOSIS — F068 Other specified mental disorders due to known physiological condition: Secondary | ICD-10-CM

## 2014-04-22 DIAGNOSIS — R7302 Impaired glucose tolerance (oral): Secondary | ICD-10-CM

## 2014-04-22 DIAGNOSIS — R7309 Other abnormal glucose: Secondary | ICD-10-CM

## 2014-04-22 MED ORDER — ASPIRIN-DIPYRIDAMOLE ER 25-200 MG PO CP12: 1.0000 | ORAL_CAPSULE | Freq: Two times a day (BID) | ORAL | Status: DC

## 2014-04-22 NOTE — Progress Notes (Signed)
Subjective:    Patient ID: [name], female    DOB: Jun 10, 1922, 80 y.o.   MRN: [id number]  HPI  Here with daughterinlaw, Here to f/u; overall doing ok,  Pt denies chest pain, increased sob or doe, wheezing, orthopnea, PND, increased LE swelling, palpitations, dizziness or syncope.  Pt denies polydipsia, polyuria, or low sugar symptoms such as weakness or confusion improved with po intake.  Pt denies new neurological symptoms such as new headache, or facial or extremity weakness or numbness.   Pt states overall good compliance with meds, has been trying to follow lower cholesterol, diabetic diet, with wt overall stable,  Pt denies fever, wt loss, night sweats, loss of appetite, or other constitutional symptoms  Denies worsening depressive symptoms, suicidal ideation, or panic; has ongoing anxiety, not increased recently, but does occas get upset with various home health assist, and xanax helps. Has new hearing aids which does help.  Family interested in helping her get zostavax immuniz but [place] too expensive, will need rx for outside pharmacy admin. Dementia overall stable symptomatically, and currently not assoc with behavioral changes such as hallucinations, paranoia, or agitation except for the above. Past Medical History  Diagnosis Date  . Aphasia 01/09/2009  . 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 CEREBROVASCULAR ACCIDENT, HX OF 11/20/2007  . CVA 01/09/2009  . DEMENTIA 05/19/2008  . DIVERTICULOSIS, COLON 11/20/2007  . FATIGUE 11/20/2007  . GLUCOSE INTOLERANCE 12/06/2010  . HYPERLIPIDEMIA 11/20/2007  . HYPERTENSION 11/20/2007    Echo was done 07/07/11: mod LVH, EF 55-60%, grade 1 diast dysfxn, mild MR, mild LAE, mod TR, PASP 39  . HYPOTHYROIDISM 11/20/2007  . IRON DEFICIENCY 12/06/2010  . PACEMAKER, PERMANENT 01/09/2009  . TIA 05/14/2003    elevated CE's in setting of TIA and falls in 10/12 - echo normal with no further cardiac w/u pursued  . Urinary incontinence 01/09/2009  . Impaired glucose  tolerance 06/03/2011  . TIA (transient ischemic attack) 10/10/2011  . Dementia   . CHF (congestive heart failure)   . Coronary artery disease   . Arthritis     right hand  . Pacemaker 2002  . Femoral fracture 07/01/2013   Past Surgical History  Procedure Laterality Date  . Medtronic cynthia dual chamber pacemaker serial number was [id number]...04/15/2009    . Abdominal hysterectomy    . S/p cerebral aneurysm  1991  . S/p pacemaker ddd    . Cholecystectomy    . S/p retinal attachment    . S/p thyroidectomy    . Cystocele repair N/A 11/26/2012    Procedure: Cystocele Repair with Lefort Colpocleisis.  colpectomy Levatorplasty Perineorraphy;  Surgeon: Lovenia Kim, MD;  Location: [facility];  Service: Gynecology;  Laterality: N/A;  Cystocele Repair with Lefort Colpocleisis.  . Hip arthroplasty Left 07/02/2013    Procedure: ARTHROPLASTY BIPOLAR HIP;  Surgeon: Mauri Pole, MD;  Location: [facility];  Service: Orthopedics;  Laterality: Left;  . Inguinal hernia repair Right 08/13/2013    Procedure: HERNIA REPAIR INGUINAL INCARCERATED;  Surgeon: Ralene Ok, MD;  Location: [facility];  Service: General;  Laterality: Right;    reports that she has never smoked. She has never used smokeless tobacco. She reports that she does not drink alcohol or use illicit drugs. family history includes Cancer in her mother. Allergies  Allergen Reactions  . Codeine     unknown   Current Outpatient Prescriptions on File Prior to Visit  Medication Sig Dispense Refill  . ALPRAZolam (XANAX) 0.25 MG tablet TAKE  1/2 -1 TABLET BY MOUTH 2 TIMES DAILY AS NEEDED  40 tablet  1  . amLODipine (NORVASC) 10 MG tablet Take 1 tablet (10 mg total) by mouth daily. Resume in 3 days if SBP > 150  90 tablet  3  . Calcium Carbonate-Vitamin D (CALTRATE 600+D) 600-400 MG-UNIT per tablet Take 1 tablet by mouth daily.        Marland Kitchen dipyridamole-aspirin (AGGRENOX) 200-25 MG per 12 hr capsule Take 1 capsule by mouth 2 (two) times daily.  180  capsule  3  . Docusate Sodium (DSS) 100 MG CAPS Take 100 mg by mouth daily.      Marland Kitchen donepezil (ARICEPT) 10 MG tablet Take 1 tablet (10 mg total) by mouth daily.  90 tablet  3  . feeding supplement, ENSURE COMPLETE, (ENSURE COMPLETE) LIQD Take 237 mLs by mouth 2 (two) times daily between meals.      Marland Kitchen levothyroxine (SYNTHROID, LEVOTHROID) 125 MCG tablet Take 1 tablet (125 mcg total) by mouth daily.  90 tablet  3  . losartan (COZAAR) 100 MG tablet Take 1 tablet (100 mg total) by mouth daily.  90 tablet  3  . Multiple Vitamin (MULTIVITAMIN WITH MINERALS) TABS Take 1 tablet by mouth daily. Centrum silver      . polyvinyl alcohol (ARTIFICIAL TEARS) 1.4 % ophthalmic solution Place 1 drop into both eyes 2 (two) times daily.      . pravastatin (PRAVACHOL) 40 MG tablet Take 1 tablet (40 mg total) by mouth daily.  90 tablet  3   No current facility-administered medications on file prior to visit.  Still living at [facility] "independent living" but actually with retirement home care provided by the facility, no longer getting pt/ot per Home health, ended last month.   Review of Systems  Constitutional: Negative for unusual diaphoresis or other sweats  HENT: Negative for ringing in ear Eyes: Negative for double vision or worsening visual disturbance.  Respiratory: Negative for choking and stridor.   Gastrointestinal: Negative for vomiting or other signifcant bowel change Genitourinary: Negative for hematuria or decreased urine volume.  Musculoskeletal: Negative for other MSK pain or swelling Skin: Negative for color change and worsening wound.  Neurological: Negative for tremors and numbness other than noted  Psychiatric/Behavioral: Negative for decreased concentration or agitation other than above       Objective:   Physical Exam BP 120/80  Pulse 86  Temp(Src) 98.2 F (36.8 C) (Oral)  Wt 150 lb (68.04 kg)  SpO2 95% VS noted,  Constitutional: Pt appears well-developed, well-nourished.  HENT:  Head: NCAT.  Right Ear: External ear normal.  Left Ear: External ear normal.  Eyes: . Pupils are equal, round, and reactive to light. Conjunctivae and EOM are normal Neck: Normal range of motion. Neck supple.  Cardiovascular: Normal rate and regular rhythm.   Pulmonary/Chest: Effort normal and breath sounds normal.  Abd:  Soft, NT, ND, + BS Neurological: Pt is alert. At baseline confused it seems , motor grossly intact Skin: Skin is warm. No rash Psychiatric: Pt behavior is normal. No agitation.      Assessment & Plan:

## 2014-04-22 NOTE — Progress Notes (Signed)
Pre visit review using our clinic review tool, if applicable. No additional management support is needed unless otherwise documented below in the visit note. 

## 2014-04-22 NOTE — Patient Instructions (Signed)
Please continue all other medications as before, and refills have been done if requested.  Please have the pharmacy call with any other refills you may need.  Please continue your efforts at being more active, low cholesterol diet, and weight control.  You are otherwise up to date with prevention measures today.  Please keep your appointments with your specialists as you may have planned  You are given the prescription for the Aggrenox, as well as shingles shot as you like  Please remember to sign up for MyChart if you have not done so, as this will be important to you in the future with finding out test results, communicating by private email, and scheduling acute appointments online when needed.  I think we can hold on lab testing today  Please return in 6 months, or sooner if needed

## 2014-04-26 NOTE — Assessment & Plan Note (Signed)
stable overall by history and exam, recent data reviewed with pt, and pt to continue medical treatment as before,  to f/u any worsening symptoms or concerns Lab Results  Component Value Date   WBC 25.2 Repeated and verified X2.* 10/10/2013   HGB 12.8 10/10/2013   HCT 37.8 10/10/2013   PLT 296.0 10/10/2013   GLUCOSE 114* 10/10/2013   CHOL 161 01/08/2013   TRIG 136 08/19/2013   HDL 62.00 01/08/2013   LDLDIRECT 73.4 11/18/2009   LDLCALC 82 01/08/2013   ALT 22 10/10/2013   AST 28 10/10/2013   NA 130* 10/10/2013   K 3.9 10/10/2013   CL 94* 10/10/2013   CREATININE 1.1 10/10/2013   BUN 19 10/10/2013   CO2 26 10/10/2013   TSH 4.50 01/08/2013   INR 1.06 07/06/2011   HGBA1C 6.2* 09/26/2012

## 2014-04-26 NOTE — Assessment & Plan Note (Signed)
stable overall by history and exam, recent data reviewed with pt, and pt to continue medical treatment as before,  to f/u any worsening symptoms or concerns Lab Results  Component Value Date   HGBA1C 6.2* 09/26/2012

## 2014-05-20 DIAGNOSIS — H6120 Impacted cerumen, unspecified ear: Secondary | ICD-10-CM | POA: Diagnosis not present

## 2014-06-03 ENCOUNTER — Ambulatory Visit (INDEPENDENT_AMBULATORY_CARE_PROVIDER_SITE_OTHER): Payer: Medicare Other | Admitting: *Deleted

## 2014-06-03 DIAGNOSIS — I442 Atrioventricular block, complete: Secondary | ICD-10-CM | POA: Diagnosis not present

## 2014-06-03 NOTE — Progress Notes (Signed)
Remote pacemaker transmission.   

## 2014-06-04 LAB — MDC_IDC_ENUM_SESS_TYPE_REMOTE
Battery Remaining Longevity: 65 mo
Battery Voltage: 2.78 V
Brady Statistic AP VP Percent: 33 %
Brady Statistic AP VS Percent: 0 %
Brady Statistic AS VP Percent: 67 %
Date Time Interrogation Session: 20150908132509
Lead Channel Impedance Value: 582 Ohm
Lead Channel Pacing Threshold Amplitude: 0.625 V
Lead Channel Pacing Threshold Amplitude: 0.625 V
Lead Channel Sensing Intrinsic Amplitude: 2.8 mV
Lead Channel Setting Pacing Amplitude: 2 V
Lead Channel Setting Pacing Amplitude: 2.5 V
Lead Channel Setting Pacing Pulse Width: 0.4 ms
Lead Channel Setting Sensing Sensitivity: 4 mV
MDC IDC MSMT BATTERY IMPEDANCE: 679 Ohm
MDC IDC MSMT LEADCHNL RA IMPEDANCE VALUE: 616 Ohm
MDC IDC MSMT LEADCHNL RA PACING THRESHOLD PULSEWIDTH: 0.4 ms
MDC IDC MSMT LEADCHNL RV PACING THRESHOLD PULSEWIDTH: 0.4 ms
MDC IDC STAT BRADY AS VS PERCENT: 0 %

## 2014-06-17 ENCOUNTER — Encounter: Payer: Self-pay | Admitting: Cardiology

## 2014-06-18 ENCOUNTER — Encounter: Payer: Self-pay | Admitting: Internal Medicine

## 2014-08-07 ENCOUNTER — Other Ambulatory Visit: Payer: Self-pay | Admitting: Internal Medicine

## 2014-08-07 NOTE — Telephone Encounter (Signed)
Done hardcopy to robin  

## 2014-08-07 NOTE — Telephone Encounter (Signed)
Faxed hardcopy for Alprazolam to [place]

## 2014-08-08 ENCOUNTER — Encounter: Payer: Self-pay | Admitting: Internal Medicine

## 2014-08-10 MED ORDER — CLOPIDOGREL BISULFATE 75 MG PO TABS: 75.0000 mg | ORAL_TABLET | Freq: Every day | ORAL | Status: DC

## 2014-08-10 NOTE — Telephone Encounter (Signed)
Robin to see above 

## 2014-09-03 ENCOUNTER — Encounter: Payer: Self-pay | Admitting: Internal Medicine

## 2014-09-03 DIAGNOSIS — I442 Atrioventricular block, complete: Secondary | ICD-10-CM | POA: Diagnosis not present

## 2014-09-04 ENCOUNTER — Ambulatory Visit (INDEPENDENT_AMBULATORY_CARE_PROVIDER_SITE_OTHER): Payer: Medicare Other | Admitting: *Deleted

## 2014-09-04 DIAGNOSIS — I442 Atrioventricular block, complete: Secondary | ICD-10-CM

## 2014-09-04 NOTE — Progress Notes (Signed)
Remote pacemaker transmission.   

## 2014-09-10 LAB — MDC_IDC_ENUM_SESS_TYPE_REMOTE
Battery Impedance: 754 Ohm
Battery Remaining Longevity: 61 mo
Battery Voltage: 2.78 V
Brady Statistic AP VP Percent: 32 %
Brady Statistic AP VS Percent: 0 %
Lead Channel Impedance Value: 658 Ohm
Lead Channel Pacing Threshold Amplitude: 0.625 V
Lead Channel Pacing Threshold Pulse Width: 0.4 ms
Lead Channel Setting Pacing Amplitude: 2 V
Lead Channel Setting Pacing Amplitude: 2.5 V
Lead Channel Setting Pacing Pulse Width: 0.4 ms
MDC IDC MSMT LEADCHNL RA PACING THRESHOLD PULSEWIDTH: 0.4 ms
MDC IDC MSMT LEADCHNL RA SENSING INTR AMPL: 2.8 mV
MDC IDC MSMT LEADCHNL RV IMPEDANCE VALUE: 562 Ohm
MDC IDC MSMT LEADCHNL RV PACING THRESHOLD AMPLITUDE: 0.75 V
MDC IDC SESS DTM: 20151209145307
MDC IDC SET LEADCHNL RV SENSING SENSITIVITY: 4 mV
MDC IDC STAT BRADY AS VP PERCENT: 67 %
MDC IDC STAT BRADY AS VS PERCENT: 0 %

## 2014-09-12 ENCOUNTER — Encounter: Payer: Self-pay | Admitting: Cardiology

## 2014-10-10 DIAGNOSIS — Z23 Encounter for immunization: Secondary | ICD-10-CM | POA: Diagnosis not present

## 2014-10-23 ENCOUNTER — Encounter: Payer: Self-pay | Admitting: Internal Medicine

## 2014-10-23 ENCOUNTER — Other Ambulatory Visit (INDEPENDENT_AMBULATORY_CARE_PROVIDER_SITE_OTHER): Payer: Medicare Other

## 2014-10-23 ENCOUNTER — Ambulatory Visit (INDEPENDENT_AMBULATORY_CARE_PROVIDER_SITE_OTHER): Payer: Medicare Other | Admitting: Internal Medicine

## 2014-10-23 VITALS — BP 122/70 | HR 87 | Temp 98.1°F | Ht 64.0 in | Wt 145.8 lb

## 2014-10-23 DIAGNOSIS — E785 Hyperlipidemia, unspecified: Secondary | ICD-10-CM | POA: Diagnosis not present

## 2014-10-23 DIAGNOSIS — E871 Hypo-osmolality and hyponatremia: Secondary | ICD-10-CM | POA: Diagnosis not present

## 2014-10-23 DIAGNOSIS — R0609 Other forms of dyspnea: Secondary | ICD-10-CM | POA: Diagnosis not present

## 2014-10-23 DIAGNOSIS — I10 Essential (primary) hypertension: Secondary | ICD-10-CM

## 2014-10-23 DIAGNOSIS — E039 Hypothyroidism, unspecified: Secondary | ICD-10-CM | POA: Diagnosis not present

## 2014-10-23 DIAGNOSIS — F0390 Unspecified dementia without behavioral disturbance: Secondary | ICD-10-CM

## 2014-10-23 DIAGNOSIS — R7302 Impaired glucose tolerance (oral): Secondary | ICD-10-CM | POA: Diagnosis not present

## 2014-10-23 LAB — CBC WITH DIFFERENTIAL/PLATELET
Basophils Absolute: 0 10*3/uL (ref 0.0–0.1)
Basophils Relative: 0.6 % (ref 0.0–3.0)
EOS ABS: 0.2 10*3/uL (ref 0.0–0.7)
Eosinophils Relative: 2.8 % (ref 0.0–5.0)
HCT: 39.5 % (ref 36.0–46.0)
Hemoglobin: 13.7 g/dL (ref 12.0–15.0)
Lymphocytes Relative: 24.7 % (ref 12.0–46.0)
Lymphs Abs: 1.7 10*3/uL (ref 0.7–4.0)
MCHC: 34.8 g/dL (ref 30.0–36.0)
MCV: 85.3 fl (ref 78.0–100.0)
MONOS PCT: 12.4 % — AB (ref 3.0–12.0)
Monocytes Absolute: 0.9 10*3/uL (ref 0.1–1.0)
NEUTROS ABS: 4.1 10*3/uL (ref 1.4–7.7)
NEUTROS PCT: 59.5 % (ref 43.0–77.0)
Platelets: 264 10*3/uL (ref 150.0–400.0)
RBC: 4.63 Mil/uL (ref 3.87–5.11)
RDW: 14.1 % (ref 11.5–15.5)
WBC: 6.9 10*3/uL (ref 4.0–10.5)

## 2014-10-23 LAB — BASIC METABOLIC PANEL
BUN: 24 mg/dL — ABNORMAL HIGH (ref 6–23)
CHLORIDE: 105 meq/L (ref 96–112)
CO2: 21 meq/L (ref 19–32)
CREATININE: 1.02 mg/dL (ref 0.40–1.20)
Calcium: 9.2 mg/dL (ref 8.4–10.5)
GFR: 54.09 mL/min — ABNORMAL LOW (ref >60.00)
GLUCOSE: 115 mg/dL — AB (ref 70–99)
Potassium: 3.9 mEq/L (ref 3.5–5.1)
Sodium: 138 mEq/L (ref 135–145)

## 2014-10-23 LAB — URINALYSIS, ROUTINE W REFLEX MICROSCOPIC
BILIRUBIN URINE: NEGATIVE
Hgb urine dipstick: NEGATIVE
KETONES UR: NEGATIVE
Leukocytes, UA: NEGATIVE
Nitrite: NEGATIVE
RBC / HPF: NONE SEEN (ref 0–?)
SPECIFIC GRAVITY, URINE: 1.015 (ref 1.000–1.030)
Total Protein, Urine: NEGATIVE
Urine Glucose: NEGATIVE
Urobilinogen, UA: 0.2 (ref 0.0–1.0)
pH: 6.5 (ref 5.0–8.0)

## 2014-10-23 LAB — HEPATIC FUNCTION PANEL
ALT: 14 U/L (ref 0–35)
AST: 18 U/L (ref 0–37)
Albumin: 4 g/dL (ref 3.5–5.2)
Alkaline Phosphatase: 75 U/L (ref 39–117)
BILIRUBIN DIRECT: 0 mg/dL (ref 0.0–0.3)
TOTAL PROTEIN: 6.9 g/dL (ref 6.0–8.3)
Total Bilirubin: 0.4 mg/dL (ref 0.2–1.2)

## 2014-10-23 LAB — LIPID PANEL
CHOLESTEROL: 160 mg/dL (ref 0–200)
HDL: 51.9 mg/dL (ref >39.00)
LDL CALC: 70 mg/dL (ref 0–99)
NonHDL: 108.1
Total CHOL/HDL Ratio: 3
Triglycerides: 192 mg/dL — ABNORMAL HIGH (ref 0.0–149.0)
VLDL: 38.4 mg/dL (ref 0.0–40.0)

## 2014-10-23 LAB — HEMOGLOBIN A1C: Hgb A1c MFr Bld: 6.4 % (ref 4.6–6.5)

## 2014-10-23 LAB — TSH: TSH: 1.83 u[IU]/mL (ref 0.35–4.50)

## 2014-10-23 MED ORDER — PRAVASTATIN SODIUM 40 MG PO TABS: 40.0000 mg | ORAL_TABLET | Freq: Every day | ORAL | Status: DC

## 2014-10-23 MED ORDER — DONEPEZIL HCL 10 MG PO TABS: 10.0000 mg | ORAL_TABLET | Freq: Every day | ORAL | Status: DC

## 2014-10-23 MED ORDER — LOSARTAN POTASSIUM 100 MG PO TABS: 100.0000 mg | ORAL_TABLET | Freq: Every day | ORAL | Status: DC

## 2014-10-23 MED ORDER — LEVOTHYROXINE SODIUM 125 MCG PO TABS: 125.0000 ug | ORAL_TABLET | Freq: Every day | ORAL | Status: DC

## 2014-10-23 MED ORDER — AMLODIPINE BESYLATE 10 MG PO TABS: 10.0000 mg | ORAL_TABLET | Freq: Every day | ORAL | Status: DC

## 2014-10-23 NOTE — Progress Notes (Signed)
Pre visit review using our clinic review tool, if applicable. No additional management support is needed unless otherwise documented below in the visit note. 

## 2014-10-23 NOTE — Progress Notes (Signed)
Subjective:    Patient ID: [name], female    DOB: 1922-05-21, 81 y.o.   MRN: [id number]  HPI Here for yearly f/u;  Overall doing ok;  Pt denies CP, worsening SOB, DOE, wheezing, orthopnea, PND, worsening LE edema, palpitations, dizziness or syncope.  Pt denies neurological change such as new headache, facial or extremity weakness.  Pt denies polydipsia, polyuria, or low sugar symptoms. Pt states overall good compliance with treatment and medications, good tolerability, and has been trying to follow lower cholesterol diet.  Pt denies worsening depressive symptoms, suicidal ideation or panic. No fever, night sweats, wt loss, loss of appetite, or other constitutional symptoms.  Pt states good ability with ADL's, has low fall risk, home safety reviewed and adequate, no other significant changes in hearing or vision, and occasionally active with exercise - walks with walker with staff at the facility around the buidling she lives twice most days. Dementia overall stable symptomatically with gradual worsening at best, and not assoc with behavioral changes such as hallucinations, paranoia, or agitation.  Seems to have some signficant doe on standing and walking in the exam room today, but this is usual for her per family. Past Medical History  Diagnosis Date  . Aphasia 01/09/2009  . 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 CEREBROVASCULAR ACCIDENT, HX OF 11/20/2007  . CVA 01/09/2009  . DEMENTIA 05/19/2008  . DIVERTICULOSIS, COLON 11/20/2007  . FATIGUE 11/20/2007  . GLUCOSE INTOLERANCE 12/06/2010  . HYPERLIPIDEMIA 11/20/2007  . HYPERTENSION 11/20/2007    Echo was done 07/07/11: mod LVH, EF 55-60%, grade 1 diast dysfxn, mild MR, mild LAE, mod TR, PASP 39  . HYPOTHYROIDISM 11/20/2007  . IRON DEFICIENCY 12/06/2010  . PACEMAKER, PERMANENT 01/09/2009  . TIA 05/14/2003    elevated CE's in setting of TIA and falls in 10/12 - echo normal with no further cardiac w/u pursued  . Urinary incontinence 01/09/2009  .  Impaired glucose tolerance 06/03/2011  . TIA (transient ischemic attack) 10/10/2011  . Dementia   . CHF (congestive heart failure)   . Coronary artery disease   . Arthritis     right hand  . Pacemaker 2002  . Femoral fracture 07/01/2013   Past Surgical History  Procedure Laterality Date  . Medtronic cynthia dual chamber pacemaker serial number was [id number]...04/15/2009    . Abdominal hysterectomy    . S/p cerebral aneurysm  1991  . S/p pacemaker ddd    . Cholecystectomy    . S/p retinal attachment    . S/p thyroidectomy    . Cystocele repair N/A 11/26/2012    Procedure: Cystocele Repair with Lefort Colpocleisis.  colpectomy Levatorplasty Perineorraphy;  Surgeon: Lovenia Kim, MD;  Location: [facility];  Service: Gynecology;  Laterality: N/A;  Cystocele Repair with Lefort Colpocleisis.  . Hip arthroplasty Left 07/02/2013    Procedure: ARTHROPLASTY BIPOLAR HIP;  Surgeon: Mauri Pole, MD;  Location: [facility];  Service: Orthopedics;  Laterality: Left;  . Inguinal hernia repair Right 08/13/2013    Procedure: HERNIA REPAIR INGUINAL INCARCERATED;  Surgeon: Ralene Ok, MD;  Location: [facility];  Service: General;  Laterality: Right;    reports that she has never smoked. She has never used smokeless tobacco. She reports that she does not drink alcohol or use illicit drugs. family history includes Cancer in her mother. Allergies  Allergen Reactions  . Codeine     unknown   Current Outpatient Prescriptions on File Prior to Visit  Medication Sig Dispense Refill  . ALPRAZolam Duanne Moron)  0.25 MG tablet TAKE 1/2 -1 TABLET BY MOUTH 2 TIMES DAILY AS NEEDED 40 tablet 1  . Calcium Carbonate-Vitamin D (CALTRATE 600+D) 600-400 MG-UNIT per tablet Take 1 tablet by mouth daily.      . clopidogrel (PLAVIX) 75 MG tablet Take 1 tablet (75 mg total) by mouth daily. 90 tablet 3  . Docusate Sodium (DSS) 100 MG CAPS Take 100 mg by mouth daily.    . feeding supplement, ENSURE COMPLETE, (ENSURE COMPLETE) LIQD Take  237 mLs by mouth 2 (two) times daily between meals.    . Multiple Vitamin (MULTIVITAMIN WITH MINERALS) TABS Take 1 tablet by mouth daily. Centrum silver    . polyvinyl alcohol (ARTIFICIAL TEARS) 1.4 % ophthalmic solution Place 1 drop into both eyes 2 (two) times daily.     No current facility-administered medications on file prior to visit.    Review of Systems Unable due to dementia    Objective:   Physical Exam BP 122/70 mmHg  Pulse 87  Temp(Src) 98.1 F (36.7 C) (Oral)  Ht 5\' 4"  (1.626 m)  Wt 145 lb 12 oz (66.112 kg)  BMI 25.01 kg/m2  SpO2 96% VS noted, ambulatory o2 sat on RA - 97% at 100 ft Constitutional: Pt appears well-developed, well-nourished.  HENT: Head: NCAT.  Right Ear: External ear normal.  Left Ear: External ear normal.  Eyes: . Pupils are equal, round, and reactive to light. Conjunctivae and EOM are normal Neck: Normal range of motion. Neck supple.  Cardiovascular: Normal rate and regular rhythm.   Pulmonary/Chest: Effort normal and breath sounds without rales or wheezing.  Abd:  Soft, NT, ND, + BS Neurological: Pt is alert. + confused , motor grossly intact, cn 2-12 intact, severe HOH Skin: Skin is warm. No rash Psychiatric: Pt behavior is normal. No agitation.     Assessment & Plan:

## 2014-10-23 NOTE — Patient Instructions (Signed)
Your ambulatory oxygen saturation test in the office today was OK  Please continue all other medications as before, and refills have been done if requested.  Please have the pharmacy call with any other refills you may need.  Please continue your efforts at being more active, low cholesterol diet, and weight control.  You are otherwise up to date with prevention measures today.  Please keep your appointments with your specialists as you may have planned  Please go to the LAB in the Basement (turn left off the elevator) for the tests to be done today  You will be contacted by phone if any changes need to be made immediately.  Otherwise, you will receive a letter about your results with an explanation, but please check with MyChart first.  Please return in 6 months, or sooner if needed

## 2014-10-25 NOTE — Assessment & Plan Note (Signed)
stable overall by history and exam, recent data reviewed with pt, and pt to continue medical treatment as before,  to f/u any worsening symptoms or concerns Lab Results  Component Value Date   TSH 1.83 10/23/2014

## 2014-10-25 NOTE — Assessment & Plan Note (Signed)
stable overall by history and exam, recent data reviewed with pt, and pt to continue medical treatment as before,  to f/u any worsening symptoms or concerns Lab Results  Component Value Date   LDLCALC 70 10/23/2014

## 2014-10-25 NOTE — Assessment & Plan Note (Signed)
stable overall by history and exam, recent data reviewed with pt, and pt to continue medical treatment as before,  to f/u any worsening symptoms or concerns BP Readings from Last 3 Encounters:  10/23/14 122/70  04/22/14 120/80  11/28/13 138/74

## 2014-10-25 NOTE — Assessment & Plan Note (Addendum)
For f/u sodium today,  to f/u any worsening symptoms or concerns Lab Results  Component Value Date   WBC 6.9 10/23/2014   HGB 13.7 10/23/2014   HCT 39.5 10/23/2014   PLT 264.0 10/23/2014   GLUCOSE 115* 10/23/2014   CHOL 160 10/23/2014   TRIG 192.0* 10/23/2014   HDL 51.90 10/23/2014   LDLDIRECT 73.4 11/18/2009   LDLCALC 70 10/23/2014   ALT 14 10/23/2014   AST 18 10/23/2014   NA 138 10/23/2014   K 3.9 10/23/2014   CL 105 10/23/2014   CREATININE 1.02 10/23/2014   BUN 24* 10/23/2014   CO2 21 10/23/2014   TSH 1.83 10/23/2014   INR 1.06 07/06/2011   HGBA1C 6.4 10/23/2014   Note:  Total time for pt hx, exam, review of record with pt in the room, determination of diagnoses and plan for further eval and tx is > 40 min, with over 50% spent in coordination and counseling of patient

## 2014-10-25 NOTE — Assessment & Plan Note (Signed)
stable overall by history and exam, and pt to continue medical treatment as before,  to f/u any worsening symptoms or concerns 

## 2014-10-25 NOTE — Assessment & Plan Note (Signed)
stable overall by history and exam, recent data reviewed with pt, and pt to continue medical treatment as before,  to f/u any worsening symptoms or concerns Lab Results  Component Value Date   HGBA1C 6.4 10/23/2014

## 2014-10-25 NOTE — Assessment & Plan Note (Signed)
Ambulatory o2 sat ok, exam benign,  to f/u any worsening symptoms or concerns

## 2014-10-30 ENCOUNTER — Encounter: Payer: Self-pay | Admitting: Internal Medicine

## 2014-12-02 ENCOUNTER — Encounter: Payer: Medicare Other | Admitting: Internal Medicine

## 2014-12-10 ENCOUNTER — Encounter: Payer: Self-pay | Admitting: Internal Medicine

## 2014-12-10 ENCOUNTER — Ambulatory Visit (INDEPENDENT_AMBULATORY_CARE_PROVIDER_SITE_OTHER): Payer: Medicare Other | Admitting: Internal Medicine

## 2014-12-10 VITALS — BP 128/60 | HR 83 | Ht 64.0 in | Wt 146.2 lb

## 2014-12-10 DIAGNOSIS — I10 Essential (primary) hypertension: Secondary | ICD-10-CM

## 2014-12-10 DIAGNOSIS — Z950 Presence of cardiac pacemaker: Secondary | ICD-10-CM

## 2014-12-10 DIAGNOSIS — I480 Paroxysmal atrial fibrillation: Secondary | ICD-10-CM

## 2014-12-10 DIAGNOSIS — I442 Atrioventricular block, complete: Secondary | ICD-10-CM | POA: Diagnosis not present

## 2014-12-10 DIAGNOSIS — Z45018 Encounter for adjustment and management of other part of cardiac pacemaker: Secondary | ICD-10-CM | POA: Diagnosis not present

## 2014-12-10 LAB — MDC_IDC_ENUM_SESS_TYPE_INCLINIC
Battery Impedance: 908 Ohm
Battery Remaining Longevity: 55 mo
Battery Voltage: 2.78 V
Brady Statistic AP VS Percent: 0 %
Brady Statistic AS VP Percent: 68 %
Brady Statistic AS VS Percent: 0 %
Date Time Interrogation Session: 20160316104132
Lead Channel Impedance Value: 637 Ohm
Lead Channel Pacing Threshold Amplitude: 0.5 V
Lead Channel Pacing Threshold Amplitude: 0.5 V
Lead Channel Pacing Threshold Pulse Width: 0.4 ms
Lead Channel Setting Pacing Amplitude: 2.5 V
Lead Channel Setting Pacing Pulse Width: 0.4 ms
MDC IDC MSMT LEADCHNL RA SENSING INTR AMPL: 4 mV
MDC IDC MSMT LEADCHNL RV IMPEDANCE VALUE: 548 Ohm
MDC IDC MSMT LEADCHNL RV PACING THRESHOLD PULSEWIDTH: 0.4 ms
MDC IDC MSMT LEADCHNL RV SENSING INTR AMPL: 8 mV
MDC IDC SET LEADCHNL RA PACING AMPLITUDE: 2 V
MDC IDC SET LEADCHNL RV SENSING SENSITIVITY: 4 mV
MDC IDC STAT BRADY AP VP PERCENT: 32 %

## 2014-12-10 NOTE — Assessment & Plan Note (Signed)
She has PAF on PM interogation. She has had multiple strokes in the past. She has a remote intracranial bleed over 20 years ago. I wonder if she could tolerate low does Eliquis. I will ask Dr. Leonie Man for an opinion.

## 2014-12-10 NOTE — Progress Notes (Signed)
HPI [name] returns today for followup. She has a history of complete heart block, status post permanent pacemaker insertion. She is a history of cerebral aneurysm and underwent surgical clipping approximately 20 years ago. The procedure was complicated by large stroke. Since then she has had multiple recurrent strokes. She has had a history of atrial fibrillation documented on her pacemaker with pacemaker interrogations. In the past year, she has survived emergent hernia surgery (strangulation), and GYN surgery, and a broken back, treated conservatively. She has not fallen. She denies chest pain, shortness of breath, or peripheral edema. Allergies  Allergen Reactions  . Codeine     unknown     Current Outpatient Prescriptions  Medication Sig Dispense Refill  . ALPRAZolam (XANAX) 0.25 MG tablet TAKE 1/2 -1 TABLET BY MOUTH 2 TIMES DAILY AS NEEDED 40 tablet 1  . amLODipine (NORVASC) 10 MG tablet Take 1 tablet (10 mg total) by mouth daily. Resume in 3 days if SBP > 150 90 tablet 3  . Calcium Carbonate-Vitamin D (CALTRATE 600+D) 600-400 MG-UNIT per tablet Take 1 tablet by mouth daily.      . clopidogrel (PLAVIX) 75 MG tablet Take 1 tablet (75 mg total) by mouth daily. 90 tablet 3  . Docusate Sodium (DSS) 100 MG CAPS Take 100 mg by mouth daily.    Marland Kitchen donepezil (ARICEPT) 10 MG tablet Take 1 tablet (10 mg total) by mouth daily. 90 tablet 3  . feeding supplement, ENSURE COMPLETE, (ENSURE COMPLETE) LIQD Take 237 mLs by mouth 2 (two) times daily between meals.    Marland Kitchen levothyroxine (SYNTHROID, LEVOTHROID) 125 MCG tablet Take 1 tablet (125 mcg total) by mouth daily. 90 tablet 3  . losartan (COZAAR) 100 MG tablet Take 1 tablet (100 mg total) by mouth daily. 90 tablet 3  . Multiple Vitamin (MULTIVITAMIN WITH MINERALS) TABS Take 1 tablet by mouth daily. Centrum silver    . polyvinyl alcohol (ARTIFICIAL TEARS) 1.4 % ophthalmic solution Place 1 drop into both eyes 2 (two) times daily.    . pravastatin  (PRAVACHOL) 40 MG tablet Take 1 tablet (40 mg total) by mouth daily. 90 tablet 3   No current facility-administered medications for this visit.     Past Medical History  Diagnosis Date  . Aphasia 01/09/2009  . 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 CEREBROVASCULAR ACCIDENT, HX OF 11/20/2007  . CVA 01/09/2009  . DEMENTIA 05/19/2008  . DIVERTICULOSIS, COLON 11/20/2007  . FATIGUE 11/20/2007  . GLUCOSE INTOLERANCE 12/06/2010  . HYPERLIPIDEMIA 11/20/2007  . HYPERTENSION 11/20/2007    Echo was done 07/07/11: mod LVH, EF 55-60%, grade 1 diast dysfxn, mild MR, mild LAE, mod TR, PASP 39  . HYPOTHYROIDISM 11/20/2007  . IRON DEFICIENCY 12/06/2010  . PACEMAKER, PERMANENT 01/09/2009  . TIA 05/14/2003    elevated CE's in setting of TIA and falls in 10/12 - echo normal with no further cardiac w/u pursued  . Urinary incontinence 01/09/2009  . Impaired glucose tolerance 06/03/2011  . TIA (transient ischemic attack) 10/10/2011  . Dementia   . CHF (congestive heart failure)   . Coronary artery disease   . Arthritis     right hand  . Pacemaker 2002  . Femoral fracture 07/01/2013    ROS:   All systems reviewed and negative except as noted in the HPI.   Past Surgical History  Procedure Laterality Date  . Medtronic cynthia dual chamber pacemaker serial number was [id number]...04/15/2009    . Abdominal hysterectomy    . S/p cerebral aneurysm  1991  . S/p pacemaker ddd    . Cholecystectomy    . S/p retinal attachment    . S/p thyroidectomy    . Cystocele repair N/A 11/26/2012    Procedure: Cystocele Repair with Lefort Colpocleisis.  colpectomy Levatorplasty Perineorraphy;  Surgeon: Lovenia Kim, MD;  Location: [facility];  Service: Gynecology;  Laterality: N/A;  Cystocele Repair with Lefort Colpocleisis.  . Hip arthroplasty Left 07/02/2013    Procedure: ARTHROPLASTY BIPOLAR HIP;  Surgeon: Mauri Pole, MD;  Location: [facility];  Service: Orthopedics;  Laterality: Left;  . Inguinal hernia repair Right 08/13/2013     Procedure: HERNIA REPAIR INGUINAL INCARCERATED;  Surgeon: Ralene Ok, MD;  Location: [facility];  Service: General;  Laterality: Right;     Family History  Problem Relation Age of Onset  . Cancer Mother     breast cancer     History   Social History  . Marital Status: Divorced    Spouse Name: N/A  . Number of Children: N/A  . Years of Education: N/A   Occupational History  . retired Radiation protection practitioner, lives at [place]  . Smoking status: Never Smoker   . Smokeless tobacco: Never Used  . Alcohol Use: No  . Drug Use: No  . Sexual Activity: Not on file   Other Topics Concern  . Not on file   Social History Narrative     BP 128/60 mmHg  Pulse 83  Ht 5\' 4"  (1.626 m)  Wt 146 lb 3.2 oz (66.316 kg)  BMI 25.08 kg/m2  Physical Exam:  Elderly appearing, NAD HEENT: Unremarkable Neck:  No JVD, no thyromegally Lungs:  Clear with no wheezes, rales, or rhonchi. HEART:  Regular rate rhythm, no murmurs, no rubs, no clicks Abd:  soft, positive bowel sounds, no organomegally, no rebound, no guarding Ext:  2 plus pulses, no edema, no cyanosis, no clubbing Skin:  No rashes no nodules Neuro:  CN II through XII intact, motor grossly intact, some difficulty with speech   DEVICE  Normal device function.  See PaceArt for details. She has had no ventricular arrhythmias. Her underlying rhythm is complete heart block with a ventricular escape of 40 beats per minute  Assess/Plan:

## 2014-12-10 NOTE — Patient Instructions (Signed)
Your physician recommends that you continue on your current medications as directed. Please refer to the Current Medication list given to you today.  Remote monitoring is used to monitor your Pacemaker of ICD from home. This monitoring reduces the number of office visits required to check your device to one time per year. It allows [place] to keep an eye on the functioning of your device to ensure it is working properly. You are scheduled for a device check from home on 03/11/15. You may send your transmission at any time that day. If you have a wireless device, the transmission will be sent automatically. After your physician reviews your transmission, you will receive a postcard with your next transmission date.  Your physician wants you to follow-up in: 1 year with Dr. Lovena Le.  You will receive a reminder letter in the mail two months in advance. If you don't receive a letter, please call our office to schedule the follow-up appointment.

## 2014-12-10 NOTE — Assessment & Plan Note (Signed)
Her blood pressure is well controlled. Will follow. No change in her meds.

## 2014-12-10 NOTE — Assessment & Plan Note (Signed)
Her medtronic DDD PM is working normally. She has 0.1% atrial fib on interogation.

## 2015-01-11 ENCOUNTER — Emergency Department (HOSPITAL_COMMUNITY): Payer: Medicare Other

## 2015-01-11 ENCOUNTER — Emergency Department (HOSPITAL_COMMUNITY)
Admission: EM | Admit: 2015-01-11 | Discharge: 2015-01-11 | Disposition: A | Discharge status: Home / Self Care | Payer: Medicare Other | Attending: Emergency Medicine | Admitting: Emergency Medicine

## 2015-01-11 ENCOUNTER — Encounter (HOSPITAL_COMMUNITY): Payer: Self-pay | Admitting: General Practice

## 2015-01-11 DIAGNOSIS — F0390 Unspecified dementia without behavioral disturbance: Secondary | ICD-10-CM | POA: Insufficient documentation

## 2015-01-11 DIAGNOSIS — Z862 Personal history of diseases of the blood and blood-forming organs and certain disorders involving the immune mechanism: Secondary | ICD-10-CM | POA: Insufficient documentation

## 2015-01-11 DIAGNOSIS — Z950 Presence of cardiac pacemaker: Secondary | ICD-10-CM | POA: Diagnosis not present

## 2015-01-11 DIAGNOSIS — I10 Essential (primary) hypertension: Secondary | ICD-10-CM | POA: Diagnosis not present

## 2015-01-11 DIAGNOSIS — E039 Hypothyroidism, unspecified: Secondary | ICD-10-CM | POA: Insufficient documentation

## 2015-01-11 DIAGNOSIS — I509 Heart failure, unspecified: Secondary | ICD-10-CM | POA: Insufficient documentation

## 2015-01-11 DIAGNOSIS — Y9389 Activity, other specified: Secondary | ICD-10-CM | POA: Diagnosis not present

## 2015-01-11 DIAGNOSIS — I2510 Atherosclerotic heart disease of native coronary artery without angina pectoris: Secondary | ICD-10-CM | POA: Insufficient documentation

## 2015-01-11 DIAGNOSIS — W01198A Fall on same level from slipping, tripping and stumbling with subsequent striking against other object, initial encounter: Secondary | ICD-10-CM | POA: Insufficient documentation

## 2015-01-11 DIAGNOSIS — Z8719 Personal history of other diseases of the digestive system: Secondary | ICD-10-CM | POA: Diagnosis not present

## 2015-01-11 DIAGNOSIS — Z8673 Personal history of transient ischemic attack (TIA), and cerebral infarction without residual deficits: Secondary | ICD-10-CM | POA: Diagnosis not present

## 2015-01-11 DIAGNOSIS — W19XXXA Unspecified fall, initial encounter: Secondary | ICD-10-CM

## 2015-01-11 DIAGNOSIS — E785 Hyperlipidemia, unspecified: Secondary | ICD-10-CM | POA: Insufficient documentation

## 2015-01-11 DIAGNOSIS — S0990XA Unspecified injury of head, initial encounter: Secondary | ICD-10-CM | POA: Diagnosis not present

## 2015-01-11 DIAGNOSIS — Z79899 Other long term (current) drug therapy: Secondary | ICD-10-CM | POA: Diagnosis not present

## 2015-01-11 DIAGNOSIS — Y998 Other external cause status: Secondary | ICD-10-CM | POA: Insufficient documentation

## 2015-01-11 DIAGNOSIS — M47812 Spondylosis without myelopathy or radiculopathy, cervical region: Secondary | ICD-10-CM | POA: Diagnosis not present

## 2015-01-11 DIAGNOSIS — Z7902 Long term (current) use of antithrombotics/antiplatelets: Secondary | ICD-10-CM | POA: Insufficient documentation

## 2015-01-11 DIAGNOSIS — M5032 Other cervical disc degeneration, mid-cervical region: Secondary | ICD-10-CM | POA: Diagnosis not present

## 2015-01-11 DIAGNOSIS — Z9181 History of falling: Secondary | ICD-10-CM | POA: Diagnosis not present

## 2015-01-11 DIAGNOSIS — Y92198 Other place in other specified residential institution as the place of occurrence of the external cause: Secondary | ICD-10-CM | POA: Insufficient documentation

## 2015-01-11 DIAGNOSIS — Z043 Encounter for examination and observation following other accident: Secondary | ICD-10-CM | POA: Diagnosis not present

## 2015-01-11 DIAGNOSIS — Y92009 Unspecified place in unspecified non-institutional (private) residence as the place of occurrence of the external cause: Secondary | ICD-10-CM

## 2015-01-11 DIAGNOSIS — M9971 Connective tissue and disc stenosis of intervertebral foramina of cervical region: Secondary | ICD-10-CM | POA: Diagnosis not present

## 2015-01-11 DIAGNOSIS — G9389 Other specified disorders of brain: Secondary | ICD-10-CM | POA: Diagnosis not present

## 2015-01-11 LAB — URINALYSIS, ROUTINE W REFLEX MICROSCOPIC
Bilirubin Urine: NEGATIVE
Glucose, UA: NEGATIVE mg/dL
HGB URINE DIPSTICK: NEGATIVE
Ketones, ur: NEGATIVE mg/dL
LEUKOCYTES UA: NEGATIVE
Nitrite: NEGATIVE
PROTEIN: NEGATIVE mg/dL
Specific Gravity, Urine: 1.006 (ref 1.005–1.030)
Urobilinogen, UA: 0.2 mg/dL (ref 0.0–1.0)
pH: 7 (ref 5.0–8.0)

## 2015-01-11 NOTE — Discharge Instructions (Signed)
Head Injury °You have received a head injury. It does not appear serious at this time. Headaches and vomiting are common following head injury. It should be easy to awaken from sleeping. Sometimes it is necessary for you to stay in the emergency department for a while for observation. Sometimes admission to the hospital may be needed. After injuries such as yours, most problems occur within the first 24 hours, but side effects may occur up to 7-10 days after the injury. It is important for you to carefully monitor your condition and contact your health care provider or seek immediate medical care if there is a change in your condition. °WHAT ARE THE TYPES OF HEAD INJURIES? °Head injuries can be as minor as a bump. Some head injuries can be more severe. More severe head injuries include: °· A jarring injury to the brain (concussion). °· A bruise of the brain (contusion). This mean there is bleeding in the brain that can cause swelling. °· A cracked skull (skull fracture). °· Bleeding in the brain that collects, clots, and forms a bump (hematoma). °WHAT CAUSES A HEAD INJURY? °A serious head injury is most likely to happen to someone who is in a car wreck and is not wearing a seat belt. Other causes of major head injuries include bicycle or motorcycle accidents, sports injuries, and falls. °HOW ARE HEAD INJURIES DIAGNOSED? °A complete history of the event leading to the injury and your current symptoms will be helpful in diagnosing head injuries. Many times, pictures of the brain, such as CT or MRI are needed to see the extent of the injury. Often, an overnight hospital stay is necessary for observation.  °WHEN SHOULD I SEEK IMMEDIATE MEDICAL CARE?  °You should get help right away if: °· You have confusion or drowsiness. °· You feel sick to your stomach (nauseous) or have continued, forceful vomiting. °· You have dizziness or unsteadiness that is getting worse. °· You have severe, continued headaches not relieved by  medicine. Only take over-the-counter or prescription medicines for pain, fever, or discomfort as directed by your health care provider. °· You do not have normal function of the arms or legs or are unable to walk. °· You notice changes in the black spots in the center of the colored part of your eye (pupil). °· You have a clear or bloody fluid coming from your nose or ears. °· You have a loss of vision. °During the next 24 hours after the injury, you must stay with someone who can watch you for the warning signs. This person should contact local emergency services (911 in the [place] if you have seizures, you become unconscious, or you are unable to wake up. °HOW CAN I PREVENT A HEAD INJURY IN THE FUTURE? °The most important factor for preventing major head injuries is avoiding motor vehicle accidents.  To minimize the potential for damage to your head, it is crucial to wear seat belts while riding in motor vehicles. Wearing helmets while bike riding and playing collision sports (like football) is also helpful. Also, avoiding dangerous activities around the house will further help reduce your risk of head injury.  °WHEN CAN I RETURN TO NORMAL ACTIVITIES AND ATHLETICS? °You should be reevaluated by your health care provider before returning to these activities. If you have any of the following symptoms, you should not return to activities or contact sports until 1 week after the symptoms have stopped: °· Persistent headache. °· Dizziness or vertigo. °· Poor attention and concentration. °· Confusion. °·   or contact sports until 1 week after the symptoms have stopped:   Persistent headache.   Dizziness or vertigo.   Poor attention and concentration.   Confusion.   Memory problems.   Nausea or vomiting.   Fatigue or tire easily.   Irritability.   Intolerant of bright lights or loud noises.   Anxiety or depression.   Disturbed sleep.  MAKE SURE YOU:    Understand these instructions.   Will watch your condition.   Will get help right away if you are not doing well or get worse.  Document Released: 09/12/2005 Document Revised: 09/17/2013 Document Reviewed:  05/20/2013  ExitCare Patient Information 2015 [employer]. This information is not intended to replace advice given to you by your health care provider. Make sure you discuss any questions you have with your health care provider.    Fall Prevention and Home Safety  Falls cause injuries and can affect all age groups. It is possible to use preventive measures to significantly decrease the likelihood of falls. There are many simple measures which can make your home safer and prevent falls.  OUTDOORS   Repair cracks and edges of walkways and driveways.   Remove high doorway thresholds.   Trim shrubbery on the main path into your home.   Have good outside lighting.   Clear walkways of tools, rocks, debris, and clutter.   Check that handrails are not broken and are securely fastened. Both sides of steps should have handrails.   Have leaves, snow, and ice cleared regularly.   Use sand or salt on walkways during winter months.   In the garage, clean up grease or oil spills.  BATHROOM   Install night lights.   Install grab bars by the toilet and in the tub and shower.   Use non-skid mats or decals in the tub or shower.   Place a plastic non-slip stool in the shower to sit on, if needed.   Keep floors dry and clean up all water on the floor immediately.   Remove soap buildup in the tub or shower on a regular basis.   Secure bath mats with non-slip, double-sided rug tape.   Remove throw rugs and tripping hazards from the floors.  BEDROOMS   Install night lights.   Make sure a bedside light is easy to reach.   Do not use oversized bedding.   Keep a telephone by your bedside.   Have a firm chair with side arms to use for getting dressed.   Remove throw rugs and tripping hazards from the floor.  KITCHEN   Keep handles on pots and pans turned toward the center of the stove. Use back burners when possible.   Clean up spills quickly and allow time for drying.   Avoid walking on wet floors.   Avoid hot  utensils and knives.   Position shelves so they are not too high or low.   Place commonly used objects within easy reach.   If necessary, use a sturdy step stool with a grab bar when reaching.   Keep electrical cables out of the way.   Do not use floor polish or wax that makes floors slippery. If you must use wax, use non-skid floor wax.   Remove throw rugs and tripping hazards from the floor.  STAIRWAYS   Never leave objects on stairs.   Place handrails on both sides of stairways and use them. Fix any loose handrails. Make sure handrails on both sides of the stairways   are as long as the stairs.   Check carpeting to make sure it is firmly attached along stairs. Make repairs to worn or loose carpet promptly.   Avoid placing throw rugs at the top or bottom of stairways, or properly secure the rug with carpet tape to prevent slippage. Get rid of throw rugs, if possible.   Have an electrician put in a light switch at the top and bottom of the stairs.  OTHER FALL PREVENTION TIPS   Wear low-heel or rubber-soled shoes that are supportive and fit well. Wear closed toe shoes.   When using a stepladder, make sure it is fully opened and both spreaders are firmly locked. Do not climb a closed stepladder.   Add color or contrast paint or tape to grab bars and handrails in your home. Place contrasting color strips on first and last steps.   Learn and use mobility aids as needed. Install an electrical emergency response system.   Turn on lights to avoid dark areas. Replace light bulbs that burn out immediately. Get light switches that glow.   Arrange furniture to create clear pathways. Keep furniture in the same place.   Firmly attach carpet with non-skid or double-sided tape.   Eliminate uneven floor surfaces.   Select a carpet pattern that does not visually hide the edge of steps.   Be aware of all pets.  OTHER HOME SAFETY TIPS   Set the water temperature for 120 F (48.8 C).   Keep emergency numbers on  or near the telephone.   Keep smoke detectors on every level of the home and near sleeping areas.  Document Released: 09/02/2002 Document Revised: 03/13/2012 Document Reviewed: 12/02/2011  Exit[employer] Patient Information 2015 [employer]. This information is not intended to replace advice given to you by your health care provider. Make sure you discuss any questions you have with your health care provider.

## 2015-01-11 NOTE — ED Notes (Signed)
Pt brought in after suffering a fall at [facility] Pt has an extra care that comes in to help her throughout the week. Pt was walking toward the bathroom, when the caregiver opened the door the patient fell backwards and hit her head. Pt has a hematoma to the back of her head. Pt has a history of dementia, and some CVA. Pt is at her neurologic baseline-per son.

## 2015-01-11 NOTE — ED Provider Notes (Signed)
CSN: [id number]     Arrival date & time 01/11/15  0906 History   First MD Initiated Contact with Patient 01/11/15 [id number]     Chief Complaint  Patient presents with  . Fall     (Consider location/radiation/quality/duration/timing/severity/associated sxs/prior Treatment) Patient is a 81 y.o. female presenting with fall. The history is provided by the patient. No language interpreter was used.  Fall This is a new problem. The current episode started 1 to 2 hours ago. The problem occurs rarely. The problem has not changed since onset.Pertinent negatives include no chest pain, no abdominal pain, no headaches and no shortness of breath. Nothing aggravates the symptoms. Nothing relieves the symptoms. She has tried nothing for the symptoms. The treatment provided no relief.    Past Medical History  Diagnosis Date  . Aphasia 01/09/2009  . 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 CEREBROVASCULAR ACCIDENT, HX OF 11/20/2007  . CVA 01/09/2009  . DEMENTIA 05/19/2008  . DIVERTICULOSIS, COLON 11/20/2007  . FATIGUE 11/20/2007  . GLUCOSE INTOLERANCE 12/06/2010  . HYPERLIPIDEMIA 11/20/2007  . HYPERTENSION 11/20/2007    Echo was done 07/07/11: mod LVH, EF 55-60%, grade 1 diast dysfxn, mild MR, mild LAE, mod TR, PASP 39  . HYPOTHYROIDISM 11/20/2007  . IRON DEFICIENCY 12/06/2010  . PACEMAKER, PERMANENT 01/09/2009  . TIA 05/14/2003    elevated CE's in setting of TIA and falls in 10/12 - echo normal with no further cardiac w/u pursued  . Urinary incontinence 01/09/2009  . Impaired glucose tolerance 06/03/2011  . TIA (transient ischemic attack) 10/10/2011  . Dementia   . CHF (congestive heart failure)   . Coronary artery disease   . Arthritis     right hand  . Pacemaker 2002  . Femoral fracture 07/01/2013   Past Surgical History  Procedure Laterality Date  . Medtronic cynthia dual chamber pacemaker serial number was [id number]...04/15/2009    . Abdominal hysterectomy    . S/p cerebral aneurysm  1991  . S/p pacemaker ddd     . Cholecystectomy    . S/p retinal attachment    . S/p thyroidectomy    . Cystocele repair N/A 11/26/2012    Procedure: Cystocele Repair with Lefort Colpocleisis.  colpectomy Levatorplasty Perineorraphy;  Surgeon: Lovenia Kim, MD;  Location: [facility];  Service: Gynecology;  Laterality: N/A;  Cystocele Repair with Lefort Colpocleisis.  . Hip arthroplasty Left 07/02/2013    Procedure: ARTHROPLASTY BIPOLAR HIP;  Surgeon: Mauri Pole, MD;  Location: [facility];  Service: Orthopedics;  Laterality: Left;  . Inguinal hernia repair Right 08/13/2013    Procedure: HERNIA REPAIR INGUINAL INCARCERATED;  Surgeon: Ralene Ok, MD;  Location: [place];  Service: General;  Laterality: Right;   Family History  Problem Relation Age of Onset  . Cancer Mother     breast cancer   History  Substance Use Topics  . Smoking status: Never Smoker   . Smokeless tobacco: Never Used  . Alcohol Use: No   OB History    No data available     Review of Systems  Constitutional: Negative for fever, chills, diaphoresis, activity change, appetite change and fatigue.  HENT: Negative for congestion, facial swelling, rhinorrhea and sore throat.   Eyes: Negative for photophobia and discharge.  Respiratory: Negative for cough, chest tightness and shortness of breath.   Cardiovascular: Negative for chest pain, palpitations and leg swelling.  Gastrointestinal: Negative for nausea, vomiting, abdominal pain and diarrhea.  Endocrine: Negative for polydipsia and polyuria.  Genitourinary: Negative for dysuria, frequency,  difficulty urinating and pelvic pain.  Musculoskeletal: Negative for back pain, arthralgias, neck pain and neck stiffness.  Skin: Negative for color change and wound.  Allergic/Immunologic: Negative for immunocompromised state.  Neurological: Negative for facial asymmetry, weakness, numbness and headaches.  Hematological: Does not bruise/bleed easily.  Psychiatric/Behavioral: Negative for confusion and  agitation.      Allergies  Codeine  Home Medications   Prior to Admission medications   Medication Sig Start Date End Date Taking? Authorizing Provider  amLODipine (NORVASC) 10 MG tablet Take 1 tablet (10 mg total) by mouth daily. Resume in 3 days if SBP > 150 Patient taking differently: Take 10 mg by mouth every morning. Resume in 3 days if SBP > 150 10/23/14  Yes Biagio Borg, MD  Calcium Carbonate-Vitamin D (CALTRATE 600+D) 600-400 MG-UNIT per tablet Take 1 tablet by mouth at bedtime.    Yes Historical Provider, MD  clopidogrel (PLAVIX) 75 MG tablet Take 1 tablet (75 mg total) by mouth daily. Patient taking differently: Take 75 mg by mouth every morning.  08/10/14  Yes Biagio Borg, MD  Docusate Sodium (DSS) 100 MG CAPS Take 100 mg by mouth every morning.  07/03/13  Yes Matthew Babish, PA-C  donepezil (ARICEPT) 10 MG tablet Take 1 tablet (10 mg total) by mouth daily. Patient taking differently: Take 10 mg by mouth every morning.  10/23/14  Yes Biagio Borg, MD  levothyroxine (SYNTHROID, LEVOTHROID) 125 MCG tablet Take 1 tablet (125 mcg total) by mouth daily. Patient taking differently: Take 125 mcg by mouth every morning.  10/23/14  Yes Biagio Borg, MD  losartan (COZAAR) 100 MG tablet Take 1 tablet (100 mg total) by mouth daily. Patient taking differently: Take 100 mg by mouth every morning.  10/23/14  Yes Biagio Borg, MD  Multiple Vitamin (MULTIVITAMIN WITH MINERALS) TABS Take 1 tablet by mouth every morning. Centrum silver   Yes Historical Provider, MD  polyvinyl alcohol (ARTIFICIAL TEARS) 1.4 % ophthalmic solution Place 1 drop into both eyes 4 (four) times daily.    Yes Historical Provider, MD  pravastatin (PRAVACHOL) 40 MG tablet Take 1 tablet (40 mg total) by mouth daily. Patient taking differently: Take 40 mg by mouth every evening.  10/23/14  Yes Biagio Borg, MD  ALPRAZolam Duanne Moron) 0.25 MG tablet TAKE 1/2 -1 TABLET BY MOUTH 2 TIMES DAILY AS NEEDED 08/07/14   Biagio Borg, MD    feeding supplement, ENSURE COMPLETE, (ENSURE COMPLETE) LIQD Take 237 mLs by mouth 2 (two) times daily between meals. Patient not taking: Reported on 01/11/2015 08/23/13   Emina Riebock, NP   BP 145/66 mmHg  Pulse 79  Temp(Src) 97.8 F (36.6 C) (Oral)  Resp 15  Ht 5\' 6"  (1.676 m)  Wt 133 lb (60.328 kg)  BMI 21.48 kg/m2  SpO2 98% Physical Exam  Constitutional: She is oriented to person, place, and time. She appears well-developed and well-nourished. No distress.  HENT:  Head: Normocephalic and atraumatic.    Mouth/Throat: No oropharyngeal exudate.  Eyes: Pupils are equal, round, and reactive to light.  Neck: Normal range of motion. Neck supple.  Cardiovascular: Normal rate, regular rhythm and normal heart sounds.  Exam reveals no gallop and no friction rub.   No murmur heard. Pulmonary/Chest: Effort normal and breath sounds normal. No respiratory distress. She has no wheezes. She has no rales.  Abdominal: Soft. Bowel sounds are normal. She exhibits no distension and no mass. There is no tenderness. There is no rebound and  no guarding.  Musculoskeletal: Normal range of motion. She exhibits no edema or tenderness.  Neurological: She is alert and oriented to person, place, and time. She has normal strength. She displays no atrophy and no tremor. No cranial nerve deficit or sensory deficit. She exhibits normal muscle tone. Coordination and gait normal. GCS eye subscore is 4. GCS verbal subscore is 4. GCS motor subscore is 6.  Skin: Skin is warm and dry.  Psychiatric: She has a normal mood and affect.    ED Course  Procedures (including critical care time) Labs Review Labs Reviewed  URINE CULTURE  URINALYSIS, ROUTINE W REFLEX MICROSCOPIC    Imaging Review Ct Head Wo Contrast  01/11/2015   CLINICAL DATA:  81 year old female with a history of fall. Anti-platelet medication.  EXAM: CT HEAD WITHOUT CONTRAST  CT CERVICAL SPINE WITHOUT CONTRAST  TECHNIQUE: Multidetector CT imaging of the  head and cervical spine was performed following the standard protocol without intravenous contrast. Multiplanar CT image reconstructions of the cervical spine were also generated.  COMPARISON:  CT 08/15/2013, 07/12/2011  FINDINGS: CT HEAD FINDINGS  Surgical changes of left frontotemporal craniotomy for prior left MCA aneurysm clipping.  No acute fracture identified.  Mastoid air cells are clear.  Trace mucosal disease of the ethmoid air cells. Visualized maxillary sinuses patent. Visualized sphenoid sinuses patent. Clear frontal sinuses.  Unremarkable appearance of the orbits.  Bilateral lens extraction.  Right scleral banding.  Unremarkable appearance of the scalp soft tissues.  Re- demonstration of encephalomalacia CT left temporal lobe or potentially postoperative changes. Encephalomalacia/ post operative for section of the left parietal region, similar to prior.  Surgical clip at the left M1/M2 region compatible with prior aneurysm clipping.  No acute intracranial hemorrhage, midline shift, or mass effect.  Similar appearance of patchy hypodensity of the bilateral frontal white matter, periventricular white matter, left cerebellum, and focal hypodensities of the bilateral basal ganglia.  Dense calcifications of the anterior and posterior circulation.  Gray-white differentiation relatively maintained. Unchanged configuration of the ventricular system.  CT CERVICAL SPINE FINDINGS  Craniocervical junction unremarkable, with no fracture identified.  Alignment of C1-T1 relatively anatomic. No evidence of subluxation. Trace anterolisthesis of C7 on T1.  No acute fracture line identified.  Vertebral bodies heights relatively maintained.  Disc space narrowing with endplate changes throughout the cervical spine, most pronounced at the C6-C7 level.  Uncovertebral joint disease present bilaterally from the C2- C7 levels. Bilateral facet hypertrophy, worst on the left. Degenerative changes contribute to varying degrees of  bilateral foraminal stenosis, worst on the left at C3-C4 and C5-C6, worst on the right at C3-C4 and C6-C7.  Relatively unremarkable appearance of the soft tissues of the cervical region.  Partially visualized cardiac pacing leads from left subclavian approach.  Unremarkable appearance of the lung apices.  Dense vascular calcifications of the bilateral carotid arteries, worst on the right.  IMPRESSION: Head:  No CT evidence of acute intracranial abnormality.  Similar appearance of surgical changes of prior left MCA aneurysm clipping with associated encephalomalacia/surgical changes of the left temporal and parietal lobes.  Sequela of chronic microvascular ischemia with associated intracranial atherosclerosis.  Cervical Spine:  No CT evidence of acute fracture or malalignment of the cervical spine.  Dense calcifications of the bilateral carotid arteries, worst on the right. Referral for carotid duplex evaluation may be useful, given the patient's prior left hemispheric brain injury.  Multilevel degenerative changes of the cervical region, as above.  Signed,  Dulcy Fanny. Earleen Newport, DO  [facility]  [facility]   Electronically Signed   By: Corrie Mckusick D.O.   On: 01/11/2015 10:49   Ct Cervical Spine Wo Contrast  01/11/2015   CLINICAL DATA:  81 year old female with a history of fall. Anti-platelet medication.  EXAM: CT HEAD WITHOUT CONTRAST  CT CERVICAL SPINE WITHOUT CONTRAST  TECHNIQUE: Multidetector CT imaging of the head and cervical spine was performed following the standard protocol without intravenous contrast. Multiplanar CT image reconstructions of the cervical spine were also generated.  COMPARISON:  CT 08/15/2013, 07/12/2011  FINDINGS: CT HEAD FINDINGS  Surgical changes of left frontotemporal craniotomy for prior left MCA aneurysm clipping.  No acute fracture identified.  Mastoid air cells are clear.  Trace mucosal disease of the ethmoid air cells. Visualized  maxillary sinuses patent. Visualized sphenoid sinuses patent. Clear frontal sinuses.  Unremarkable appearance of the orbits.  Bilateral lens extraction.  Right scleral banding.  Unremarkable appearance of the scalp soft tissues.  Re- demonstration of encephalomalacia CT left temporal lobe or potentially postoperative changes. Encephalomalacia/ post operative for section of the left parietal region, similar to prior.  Surgical clip at the left M1/M2 region compatible with prior aneurysm clipping.  No acute intracranial hemorrhage, midline shift, or mass effect.  Similar appearance of patchy hypodensity of the bilateral frontal white matter, periventricular white matter, left cerebellum, and focal hypodensities of the bilateral basal ganglia.  Dense calcifications of the anterior and posterior circulation.  Gray-white differentiation relatively maintained. Unchanged configuration of the ventricular system.  CT CERVICAL SPINE FINDINGS  Craniocervical junction unremarkable, with no fracture identified.  Alignment of C1-T1 relatively anatomic. No evidence of subluxation. Trace anterolisthesis of C7 on T1.  No acute fracture line identified.  Vertebral bodies heights relatively maintained.  Disc space narrowing with endplate changes throughout the cervical spine, most pronounced at the C6-C7 level.  Uncovertebral joint disease present bilaterally from the C2- C7 levels. Bilateral facet hypertrophy, worst on the left. Degenerative changes contribute to varying degrees of bilateral foraminal stenosis, worst on the left at C3-C4 and C5-C6, worst on the right at C3-C4 and C6-C7.  Relatively unremarkable appearance of the soft tissues of the cervical region.  Partially visualized cardiac pacing leads from left subclavian approach.  Unremarkable appearance of the lung apices.  Dense vascular calcifications of the bilateral carotid arteries, worst on the right.  IMPRESSION: Head:  No CT evidence of acute intracranial  abnormality.  Similar appearance of surgical changes of prior left MCA aneurysm clipping with associated encephalomalacia/surgical changes of the left temporal and parietal lobes.  Sequela of chronic microvascular ischemia with associated intracranial atherosclerosis.  Cervical Spine:  No CT evidence of acute fracture or malalignment of the cervical spine.  Dense calcifications of the bilateral carotid arteries, worst on the right. Referral for carotid duplex evaluation may be useful, given the patient's prior left hemispheric brain injury.  Multilevel degenerative changes of the cervical region, as above.  Signed,  Dulcy Fanny. Earleen Newport, DO  [facility]  [facility]   Electronically Signed   By: Corrie Mckusick D.O.   On: 01/11/2015 10:49     EKG Interpretation   Date/Time:  Sunday January 11 2015 09:45:57 EDT Ventricular Rate:  72 PR Interval:  127 QRS Duration: 184 QT Interval:  488 QTC Calculation: 534 R Axis:   -81 Text Interpretation:  AV SEQUENTIAL PACEMAKER No significant change since  last tracing Confirmed by [place]  MD, Rivereno ([id number]) on 01/11/2015 9:49:38  AM  MDM   Final diagnoses:  Fall at home, initial encounter    Pt is a 81 y.o. female with Pmhx as above who presents with witnessed fall at her independent living facility this morning.  Patient was letting her caregiver in the [facility] and was walking back through the apartment when she fell backwards and struck the back of her head.  No loss of consciousness.  She is at her baseline mental status per son.. She denies headache, neck pain, chest pain, abdominal pain, extremity pain.  Patient's son states she has been doing well recently and has no other acute complaints.  On physical exam vitals are stable.  Patient is in acute distress.  She is pleasantly mildly demented.  No focal neuro findings.  She has occipital scalp hematoma.  Given age, Plavix use, will get CT head and C-spine, as  well as screening EKG.   EKG unchanged from prior w/ dual AV pacing. CT head & c-spine w/o acute findings. She has dense incidental calcifications on bilateral carotids, given history of CVA.  Radiology recommending outpatient ultrasound.  Patient's son states she has had this may be your ago.  I will have him follow up with their PCP, Dr. Jenny Reichmann about timing of repeating carotid ultrasounds.  Urine does not appear infected, has been sent for culture.    [name] evaluation in the Emergency Department is complete. It has been determined that no acute conditions requiring further emergency intervention are present at this time. The patient/guardian have been advised of the diagnosis and plan. We have discussed signs and symptoms that warrant return to the ED, such as changes or worsening in symptoms, worsening headache, confusion, numbness, weakness.      Ernestina Patches, MD 01/11/15 1102

## 2015-01-12 LAB — URINE CULTURE
Colony Count: 50000
Special Requests: NORMAL

## 2015-03-11 ENCOUNTER — Ambulatory Visit (INDEPENDENT_AMBULATORY_CARE_PROVIDER_SITE_OTHER): Payer: Medicare Other | Admitting: *Deleted

## 2015-03-11 DIAGNOSIS — I442 Atrioventricular block, complete: Secondary | ICD-10-CM

## 2015-03-11 NOTE — Progress Notes (Signed)
Remote pacemaker transmission.   

## 2015-03-15 LAB — CUP PACEART REMOTE DEVICE CHECK
Battery Impedance: 1011 Ohm
Battery Remaining Longevity: 51 mo
Battery Voltage: 2.78 V
Brady Statistic AP VP Percent: 35 %
Brady Statistic AS VP Percent: 64 %
Brady Statistic AS VS Percent: 0 %
Lead Channel Impedance Value: 558 Ohm
Lead Channel Impedance Value: 626 Ohm
Lead Channel Pacing Threshold Amplitude: 0.5 V
Lead Channel Pacing Threshold Pulse Width: 0.4 ms
Lead Channel Sensing Intrinsic Amplitude: 2.8 mV
Lead Channel Setting Pacing Amplitude: 2 V
Lead Channel Setting Pacing Pulse Width: 0.4 ms
Lead Channel Setting Sensing Sensitivity: 4 mV
MDC IDC MSMT LEADCHNL RV PACING THRESHOLD AMPLITUDE: 0.625 V
MDC IDC MSMT LEADCHNL RV PACING THRESHOLD PULSEWIDTH: 0.4 ms
MDC IDC SESS DTM: 20160615144039
MDC IDC SET LEADCHNL RV PACING AMPLITUDE: 2.5 V
MDC IDC STAT BRADY AP VS PERCENT: 0 %

## 2015-03-18 ENCOUNTER — Encounter: Payer: Self-pay | Admitting: Cardiology

## 2015-03-23 ENCOUNTER — Encounter: Payer: Self-pay | Admitting: Internal Medicine

## 2015-04-23 ENCOUNTER — Encounter: Payer: Self-pay | Admitting: Internal Medicine

## 2015-04-23 ENCOUNTER — Other Ambulatory Visit (INDEPENDENT_AMBULATORY_CARE_PROVIDER_SITE_OTHER): Payer: Medicare Other

## 2015-04-23 ENCOUNTER — Ambulatory Visit (INDEPENDENT_AMBULATORY_CARE_PROVIDER_SITE_OTHER): Payer: Medicare Other | Admitting: Internal Medicine

## 2015-04-23 VITALS — BP 120/64 | HR 72 | Temp 98.3°F | Ht 68.0 in | Wt 146.0 lb

## 2015-04-23 DIAGNOSIS — N390 Urinary tract infection, site not specified: Secondary | ICD-10-CM | POA: Diagnosis not present

## 2015-04-23 DIAGNOSIS — E785 Hyperlipidemia, unspecified: Secondary | ICD-10-CM

## 2015-04-23 DIAGNOSIS — E039 Hypothyroidism, unspecified: Secondary | ICD-10-CM | POA: Diagnosis not present

## 2015-04-23 LAB — URINALYSIS, ROUTINE W REFLEX MICROSCOPIC
Bilirubin Urine: NEGATIVE
HGB URINE DIPSTICK: NEGATIVE
Ketones, ur: NEGATIVE
Leukocytes, UA: NEGATIVE
Nitrite: NEGATIVE
RBC / HPF: NONE SEEN (ref 0–?)
Specific Gravity, Urine: 1.01 (ref 1.000–1.030)
Total Protein, Urine: NEGATIVE
UROBILINOGEN UA: 0.2 (ref 0.0–1.0)
Urine Glucose: NEGATIVE
WBC UA: NONE SEEN (ref 0–?)
pH: 6.5 (ref 5.0–8.0)

## 2015-04-23 MED ORDER — AMLODIPINE BESYLATE 10 MG PO TABS: 10.0000 mg | ORAL_TABLET | Freq: Every day | ORAL | Status: DC

## 2015-04-23 MED ORDER — DONEPEZIL HCL 10 MG PO TABS: 10.0000 mg | ORAL_TABLET | Freq: Every day | ORAL | Status: DC

## 2015-04-23 MED ORDER — PRAVASTATIN SODIUM 40 MG PO TABS: 40.0000 mg | ORAL_TABLET | Freq: Every day | ORAL | Status: DC

## 2015-04-23 MED ORDER — LEVOTHYROXINE SODIUM 125 MCG PO TABS: 125.0000 ug | ORAL_TABLET | Freq: Every day | ORAL | Status: DC

## 2015-04-23 MED ORDER — LOSARTAN POTASSIUM 100 MG PO TABS: 100.0000 mg | ORAL_TABLET | Freq: Every day | ORAL | Status: DC

## 2015-04-23 MED ORDER — CLOPIDOGREL BISULFATE 75 MG PO TABS
75.0000 mg | ORAL_TABLET | Freq: Every day | ORAL | Status: DC
Start: 2015-04-23 — End: 2016-04-26

## 2015-04-23 NOTE — Assessment & Plan Note (Signed)
Lab Results  Component Value Date   TSH 1.83 10/23/2014   stable overall by history and exam, recent data reviewed with pt, and pt to continue medical treatment as before,  to f/u any worsening symptoms or concerns

## 2015-04-23 NOTE — Progress Notes (Signed)
Subjective:    Patient ID: [name], female    DOB: June 19, 1922, 81 y.o.   MRN: [id number]  HPI  Here with granddaughter, Here to f/u; overall doing ok,  Pt denies chest pain, increasing sob or doe, wheezing, orthopnea, PND, increased LE swelling, palpitations, dizziness or syncope.  Pt denies new neurological symptoms such as new headache, or facial or extremity weakness or numbness.  Pt denies polydipsia, polyuria, or low sugar episode.   Pt denies new neurological symptoms such as new headache, or facial or extremity weakness or numbness.   Pt states overall good compliance with meds, mostly trying to follow appropriate diet, with wt overall stable,  but little exercise however. Denies hyper or hypo thyroid symptoms such as voice, skin or hair change.  Past Medical History  Diagnosis Date  . Aphasia 01/09/2009  . 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 CEREBROVASCULAR ACCIDENT, HX OF 11/20/2007  . CVA 01/09/2009  . DEMENTIA 05/19/2008  . DIVERTICULOSIS, COLON 11/20/2007  . FATIGUE 11/20/2007  . GLUCOSE INTOLERANCE 12/06/2010  . HYPERLIPIDEMIA 11/20/2007  . HYPERTENSION 11/20/2007    Echo was done 07/07/11: mod LVH, EF 55-60%, grade 1 diast dysfxn, mild MR, mild LAE, mod TR, PASP 39  . HYPOTHYROIDISM 11/20/2007  . IRON DEFICIENCY 12/06/2010  . PACEMAKER, PERMANENT 01/09/2009  . TIA 05/14/2003    elevated CE's in setting of TIA and falls in 10/12 - echo normal with no further cardiac w/u pursued  . Urinary incontinence 01/09/2009  . Impaired glucose tolerance 06/03/2011  . TIA (transient ischemic attack) 10/10/2011  . Dementia   . CHF (congestive heart failure)   . Coronary artery disease   . Arthritis     right hand  . Pacemaker 2002  . Femoral fracture 07/01/2013   Past Surgical History  Procedure Laterality Date  . Medtronic cynthia dual chamber pacemaker serial number was [id number]...04/15/2009    . Abdominal hysterectomy    . S/p cerebral aneurysm  1991  . S/p pacemaker ddd    .  Cholecystectomy    . S/p retinal attachment    . S/p thyroidectomy    . Cystocele repair N/A 11/26/2012    Procedure: Cystocele Repair with Lefort Colpocleisis.  colpectomy Levatorplasty Perineorraphy;  Surgeon: Lovenia Kim, MD;  Location: [facility];  Service: Gynecology;  Laterality: N/A;  Cystocele Repair with Lefort Colpocleisis.  . Hip arthroplasty Left 07/02/2013    Procedure: ARTHROPLASTY BIPOLAR HIP;  Surgeon: Mauri Pole, MD;  Location: [facility];  Service: Orthopedics;  Laterality: Left;  . Inguinal hernia repair Right 08/13/2013    Procedure: HERNIA REPAIR INGUINAL INCARCERATED;  Surgeon: Ralene Ok, MD;  Location: [place];  Service: General;  Laterality: Right;    reports that she has never smoked. She has never used smokeless tobacco. She reports that she does not drink alcohol or use illicit drugs. family history includes Cancer in her mother. Allergies  Allergen Reactions  . Codeine     unknown   Current Outpatient Prescriptions on File Prior to Visit  Medication Sig Dispense Refill  . ALPRAZolam (XANAX) 0.25 MG tablet TAKE 1/2 -1 TABLET BY MOUTH 2 TIMES DAILY AS NEEDED 40 tablet 1  . Calcium Carbonate-Vitamin D (CALTRATE 600+D) 600-400 MG-UNIT per tablet Take 1 tablet by mouth at bedtime.     Mariane Baumgarten Sodium (DSS) 100 MG CAPS Take 100 mg by mouth every morning.     . feeding supplement, ENSURE COMPLETE, (ENSURE COMPLETE) LIQD Take 237 mLs by mouth  2 (two) times daily between meals.    . Multiple Vitamin (MULTIVITAMIN WITH MINERALS) TABS Take 1 tablet by mouth every morning. Centrum silver    . polyvinyl alcohol (ARTIFICIAL TEARS) 1.4 % ophthalmic solution Place 1 drop into both eyes 4 (four) times daily.      No current facility-administered medications on file prior to visit.   Review of Systems  Constitutional: Negative for unusual diaphoresis or night sweats HENT: Negative for ringing in ear or discharge Eyes: Negative for double vision or worsening visual  disturbance.  Respiratory: Negative for choking and stridor.   Gastrointestinal: Negative for vomiting or other signifcant bowel change Genitourinary: Negative for hematuria or change in urine volume.  Musculoskeletal: Negative for other MSK pain or swelling Skin: Negative for color change and worsening wound.  Neurological: Negative for tremors and numbness other than noted  Psychiatric/Behavioral: Negative for decreased concentration or agitation other than above       Objective:   Physical Exam BP 120/64 mmHg  Pulse 72  Temp(Src) 98.3 F (36.8 C) (Oral)  Ht 5\' 8"  (1.727 m)  Wt 146 lb (66.225 kg)  BMI 22.20 kg/m2  SpO2 97% VS noted,  Constitutional: Pt appears in no significant distress HENT: Head: NCAT.  Right Ear: External ear normal.  Left Ear: External ear normal.  Eyes: . Pupils are equal, round, and reactive to light. Conjunctivae and EOM are normal Neck: Normal range of motion. Neck supple.  Cardiovascular: Normal rate and regular rhythm.   Pulmonary/Chest: Effort normal and breath sounds without rales or wheezing.  Abd:  Soft, NT, ND, + BS Neurological: Pt is alert. At baseline confused , motor grossly intact Skin: Skin is warm. No rash, no LE edema Psychiatric: Pt behavior is normal. No agitation.     Assessment & Plan:

## 2015-04-23 NOTE — Assessment & Plan Note (Signed)
stable overall by history and exam, recent data reviewed with pt, and pt to continue medical treatment as before,  to f/u any worsening symptoms or concerns Lab Results  Component Value Date   LDLCALC 70 10/23/2014   Declines fo f/u labs today

## 2015-04-23 NOTE — Assessment & Plan Note (Signed)
Asympt, for f/u ua with recurring infections

## 2015-04-23 NOTE — Patient Instructions (Signed)
Please continue all other medications as before, and refills have been done if requested.  Please have the pharmacy call with any other refills you may need.  Please keep your appointments with your specialists as you may have planned  Please go to the LAB in the Basement (turn left off the elevator) for the tests to be done today - just the urine testing today  You will be contacted by phone if any changes need to be made immediately.  Otherwise, you will receive a letter about your results with an explanation, but please check with MyChart first.  Please remember to sign up for MyChart if you have not done so, as this will be important to you in the future with finding out test results, communicating by private email, and scheduling acute appointments online when needed.  Please return in 6 months, or sooner if needed

## 2015-04-23 NOTE — Progress Notes (Signed)
Pre visit review using our clinic review tool, if applicable. No additional management support is needed unless otherwise documented below in the visit note. 

## 2015-04-24 LAB — URINE CULTURE: Colony Count: 40000

## 2015-06-11 ENCOUNTER — Encounter: Payer: Medicare Other | Admitting: *Deleted

## 2015-06-11 ENCOUNTER — Ambulatory Visit (INDEPENDENT_AMBULATORY_CARE_PROVIDER_SITE_OTHER): Payer: Medicare Other | Admitting: *Deleted

## 2015-06-11 ENCOUNTER — Encounter: Payer: Self-pay | Admitting: Internal Medicine

## 2015-06-11 DIAGNOSIS — I442 Atrioventricular block, complete: Secondary | ICD-10-CM

## 2015-06-11 DIAGNOSIS — I480 Paroxysmal atrial fibrillation: Secondary | ICD-10-CM

## 2015-06-11 DIAGNOSIS — Z45018 Encounter for adjustment and management of other part of cardiac pacemaker: Secondary | ICD-10-CM

## 2015-06-11 DIAGNOSIS — Z950 Presence of cardiac pacemaker: Secondary | ICD-10-CM

## 2015-06-11 LAB — CUP PACEART INCLINIC DEVICE CHECK
Battery Remaining Longevity: 49 mo
Brady Statistic AP VP Percent: 43 %
Brady Statistic AS VP Percent: 54 %
Lead Channel Pacing Threshold Amplitude: 0.5 V
Lead Channel Pacing Threshold Amplitude: 0.75 V
Lead Channel Pacing Threshold Pulse Width: 0.4 ms
Lead Channel Setting Pacing Amplitude: 2 V
Lead Channel Setting Pacing Amplitude: 2.5 V
Lead Channel Setting Pacing Pulse Width: 0.4 ms
Lead Channel Setting Sensing Sensitivity: 2.8 mV
MDC IDC MSMT BATTERY IMPEDANCE: 1088 Ohm
MDC IDC MSMT BATTERY VOLTAGE: 2.78 V
MDC IDC MSMT LEADCHNL RA IMPEDANCE VALUE: 626 Ohm
MDC IDC MSMT LEADCHNL RA SENSING INTR AMPL: 2.8 mV
MDC IDC MSMT LEADCHNL RV IMPEDANCE VALUE: 563 Ohm
MDC IDC MSMT LEADCHNL RV PACING THRESHOLD PULSEWIDTH: 0.4 ms
MDC IDC MSMT LEADCHNL RV SENSING INTR AMPL: 8 mV
MDC IDC SESS DTM: 20160915105638
MDC IDC STAT BRADY AP VS PERCENT: 1 %
MDC IDC STAT BRADY AS VS PERCENT: 2 %

## 2015-06-11 NOTE — Progress Notes (Signed)
Add-on pacemaker check in clinic. Normal device function. Thresholds, sensing, impedances consistent with previous measurements. Device programmed to maximize longevity. 80 mode switches (<0.1%), 2 AHR episodes, longest 1hr 91min, peak A >400bpm, peak V 96bpm; no A/C, per GT note from 11/2014, remote history of cerebral aneurysm. 6 high ventricular rates noted, longest 7sec, peak V 202bpm, markers only. Device programmed at appropriate safety margins. Histogram distribution appropriate for patient activity level. Device programmed to optimize intrinsic conduction. Estimated longevity 4 years. Patient education completed. [facility] on 09/10/2015, ROV with GT in 11/2015.

## 2015-07-29 ENCOUNTER — Encounter: Payer: Self-pay | Admitting: Internal Medicine

## 2015-07-29 ENCOUNTER — Ambulatory Visit (INDEPENDENT_AMBULATORY_CARE_PROVIDER_SITE_OTHER): Payer: Medicare Other | Admitting: Internal Medicine

## 2015-07-29 VITALS — BP 122/68 | HR 86 | Wt 146.4 lb

## 2015-07-29 DIAGNOSIS — Z950 Presence of cardiac pacemaker: Secondary | ICD-10-CM | POA: Diagnosis not present

## 2015-07-29 DIAGNOSIS — I442 Atrioventricular block, complete: Secondary | ICD-10-CM

## 2015-07-29 DIAGNOSIS — I10 Essential (primary) hypertension: Secondary | ICD-10-CM | POA: Diagnosis not present

## 2015-07-29 DIAGNOSIS — I480 Paroxysmal atrial fibrillation: Secondary | ICD-10-CM

## 2015-07-29 LAB — CUP PACEART INCLINIC DEVICE CHECK
Battery Remaining Longevity: 46 mo
Brady Statistic AP VP Percent: 54 %
Brady Statistic AS VS Percent: 3 %
Date Time Interrogation Session: 20161102150420
Implantable Lead Implant Date: 20020920
Implantable Lead Location: 753860
Implantable Lead Model: 5076
Lead Channel Impedance Value: 616 Ohm
Lead Channel Pacing Threshold Amplitude: 0.5 V
Lead Channel Pacing Threshold Amplitude: 0.75 V
Lead Channel Pacing Threshold Pulse Width: 0.4 ms
Lead Channel Pacing Threshold Pulse Width: 0.4 ms
Lead Channel Sensing Intrinsic Amplitude: 2.8 mV
Lead Channel Setting Pacing Amplitude: 2.5 V
MDC IDC LEAD IMPLANT DT: 20020920
MDC IDC LEAD LOCATION: 753859
MDC IDC MSMT BATTERY IMPEDANCE: 1196 Ohm
MDC IDC MSMT BATTERY VOLTAGE: 2.77 V
MDC IDC MSMT LEADCHNL RA PACING THRESHOLD AMPLITUDE: 0.5 V
MDC IDC MSMT LEADCHNL RA PACING THRESHOLD PULSEWIDTH: 0.4 ms
MDC IDC MSMT LEADCHNL RA PACING THRESHOLD PULSEWIDTH: 0.4 ms
MDC IDC MSMT LEADCHNL RV IMPEDANCE VALUE: 526 Ohm
MDC IDC MSMT LEADCHNL RV PACING THRESHOLD AMPLITUDE: 0.5 V
MDC IDC MSMT LEADCHNL RV SENSING INTR AMPL: 11.2 mV
MDC IDC SET LEADCHNL RA PACING AMPLITUDE: 2 V
MDC IDC SET LEADCHNL RV PACING PULSEWIDTH: 0.4 ms
MDC IDC SET LEADCHNL RV SENSING SENSITIVITY: 2.8 mV
MDC IDC STAT BRADY AP VS PERCENT: 1 %
MDC IDC STAT BRADY AS VP PERCENT: 42 %

## 2015-07-29 NOTE — Patient Instructions (Signed)
Your physician recommends that you continue on your current medications as directed. Please refer to the Current Medication list given to you today.  Your physician wants you to follow-up in: 1 year with Dr. Taylor.  You will receive a reminder letter in the mail two months in advance. If you don't receive a letter, please call our office to schedule the follow-up appointment.  

## 2015-07-29 NOTE — Assessment & Plan Note (Signed)
Her blood pressure is well controlled. No change in medications. 

## 2015-07-29 NOTE — Progress Notes (Signed)
HPI [name] returns today for followup. She has a history of complete heart block, status post permanent pacemaker insertion. She is a history of cerebral aneurysm and underwent surgical clipping approximately 20 years ago. The procedure was complicated by large stroke. Since then she has had multiple recurrent strokes. She has had a history of atrial fibrillation documented on her pacemaker with pacemaker interrogations. In the past year, she has not fallen. She denies chest pain, shortness of breath, or peripheral edema. The patient denies sob. Allergies  Allergen Reactions  . Codeine     unknown     Current Outpatient Prescriptions  Medication Sig Dispense Refill  . ALPRAZolam (XANAX) 0.25 MG tablet TAKE 1/2 -1 TABLET BY MOUTH 2 TIMES DAILY AS NEEDED 40 tablet 1  . amLODipine (NORVASC) 10 MG tablet Take 1 tablet (10 mg total) by mouth daily. Resume in 3 days if SBP > 150 90 tablet 3  . Calcium Carbonate-Vitamin D (CALTRATE 600+D) 600-400 MG-UNIT per tablet Take 1 tablet by mouth at bedtime.     . clopidogrel (PLAVIX) 75 MG tablet Take 1 tablet (75 mg total) by mouth daily. 90 tablet 3  . Docusate Sodium (DSS) 100 MG CAPS Take 100 mg by mouth every morning.     . donepezil (ARICEPT) 10 MG tablet Take 1 tablet (10 mg total) by mouth daily. 90 tablet 3  . feeding supplement, ENSURE COMPLETE, (ENSURE COMPLETE) LIQD Take 237 mLs by mouth 2 (two) times daily between meals. (Patient taking differently: Take 237 mLs by mouth 2 (two) times daily between meals as needed. )    . levothyroxine (SYNTHROID, LEVOTHROID) 125 MCG tablet Take 1 tablet (125 mcg total) by mouth daily. 90 tablet 3  . losartan (COZAAR) 100 MG tablet Take 1 tablet (100 mg total) by mouth daily. 90 tablet 3  . Multiple Vitamin (MULTIVITAMIN WITH MINERALS) TABS Take 1 tablet by mouth every morning. Centrum silver    . polyvinyl alcohol (ARTIFICIAL TEARS) 1.4 % ophthalmic solution Place 1 drop into both eyes 4 (four) times  daily.     . pravastatin (PRAVACHOL) 40 MG tablet Take 1 tablet (40 mg total) by mouth daily. 90 tablet 3   No current facility-administered medications for this visit.     Past Medical History  Diagnosis Date  . Aphasia 01/09/2009  . 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 CEREBROVASCULAR ACCIDENT, HX OF 11/20/2007  . CVA 01/09/2009  . DEMENTIA 05/19/2008  . DIVERTICULOSIS, COLON 11/20/2007  . FATIGUE 11/20/2007  . GLUCOSE INTOLERANCE 12/06/2010  . HYPERLIPIDEMIA 11/20/2007  . HYPERTENSION 11/20/2007    Echo was done 07/07/11: mod LVH, EF 55-60%, grade 1 diast dysfxn, mild MR, mild LAE, mod TR, PASP 39  . HYPOTHYROIDISM 11/20/2007  . IRON DEFICIENCY 12/06/2010  . PACEMAKER, PERMANENT 01/09/2009  . TIA 05/14/2003    elevated CE's in setting of TIA and falls in 10/12 - echo normal with no further cardiac w/u pursued  . Urinary incontinence 01/09/2009  . Impaired glucose tolerance 06/03/2011  . TIA (transient ischemic attack) 10/10/2011  . Dementia   . CHF (congestive heart failure) ([place])   . Coronary artery disease   . Arthritis     right hand  . Pacemaker 2002  . Femoral fracture ([place]) 07/01/2013    ROS:   All systems reviewed and negative except as noted in the HPI.   Past Surgical History  Procedure Laterality Date  . Medtronic cynthia dual chamber pacemaker serial number was [id number]...04/15/2009    .  Abdominal hysterectomy    . S/p cerebral aneurysm  1991  . S/p pacemaker ddd    . Cholecystectomy    . S/p retinal attachment    . S/p thyroidectomy    . Cystocele repair N/A 11/26/2012    Procedure: Cystocele Repair with Lefort Colpocleisis.  colpectomy Levatorplasty Perineorraphy;  Surgeon: Lovenia Kim, MD;  Location: [facility];  Service: Gynecology;  Laterality: N/A;  Cystocele Repair with Lefort Colpocleisis.  . Hip arthroplasty Left 07/02/2013    Procedure: ARTHROPLASTY BIPOLAR HIP;  Surgeon: Mauri Pole, MD;  Location: [facility];  Service: Orthopedics;  Laterality: Left;  .  Inguinal hernia repair Right 08/13/2013    Procedure: HERNIA REPAIR INGUINAL INCARCERATED;  Surgeon: Ralene Ok, MD;  Location: [facility];  Service: General;  Laterality: Right;     Family History  Problem Relation Age of Onset  . Cancer Mother     breast cancer     Social History   Social History  . Marital Status: Divorced    Spouse Name: N/A  . Number of Children: N/A  . Years of Education: N/A   Occupational History  . retired Radiation protection practitioner, lives at [place]  . Smoking status: Never Smoker   . Smokeless tobacco: Never Used  . Alcohol Use: No  . Drug Use: No  . Sexual Activity: Not on file   Other Topics Concern  . Not on file   Social History Narrative     BP 122/68 mmHg  Pulse 86  Wt 146 lb 6.4 oz (66.407 kg)  SpO2 97%  Physical Exam:  Elderly appearing, NAD HEENT: Unremarkable Neck:  No JVD, no thyromegally Lungs:  Clear with no wheezes, rales, or rhonchi. HEART:  Regular rate rhythm, no murmurs, no rubs, no clicks Abd:  soft, positive bowel sounds, no organomegally, no rebound, no guarding Ext:  2 plus pulses, no edema, no cyanosis, no clubbing Skin:  No rashes no nodules Neuro:  CN II through XII intact, motor grossly intact, some difficulty with speech   DEVICE  Normal device function.  See PaceArt for details. She has had no ventricular arrhythmias. Her underlying rhythm is complete heart block with a ventricular escape of 40 beats per minute  Assess/Plan:

## 2015-07-29 NOTE — Assessment & Plan Note (Signed)
She is maintaining sinus rhythm over 99% of the time. We discussed the possibility of an antiarrhythmic drug, but I think the risk, benefit ratio is in favor of not using antiarrhythmic drugs at this time.

## 2015-07-29 NOTE — Assessment & Plan Note (Signed)
Her Medtronic dual-chamber pacemaker is working normally. She has approximately 4 years of battery longevity. We'll plan to recheck in several months.

## 2015-09-16 IMAGING — CR DG CHEST 1V
1 series · 1 of 1 positions shown · non-contrast
Comparison: Chest radiograph 10/26/2012

CLINICAL DATA: Status post fall.

EXAM:
CHEST - 1 VIEW

[t chest supine]
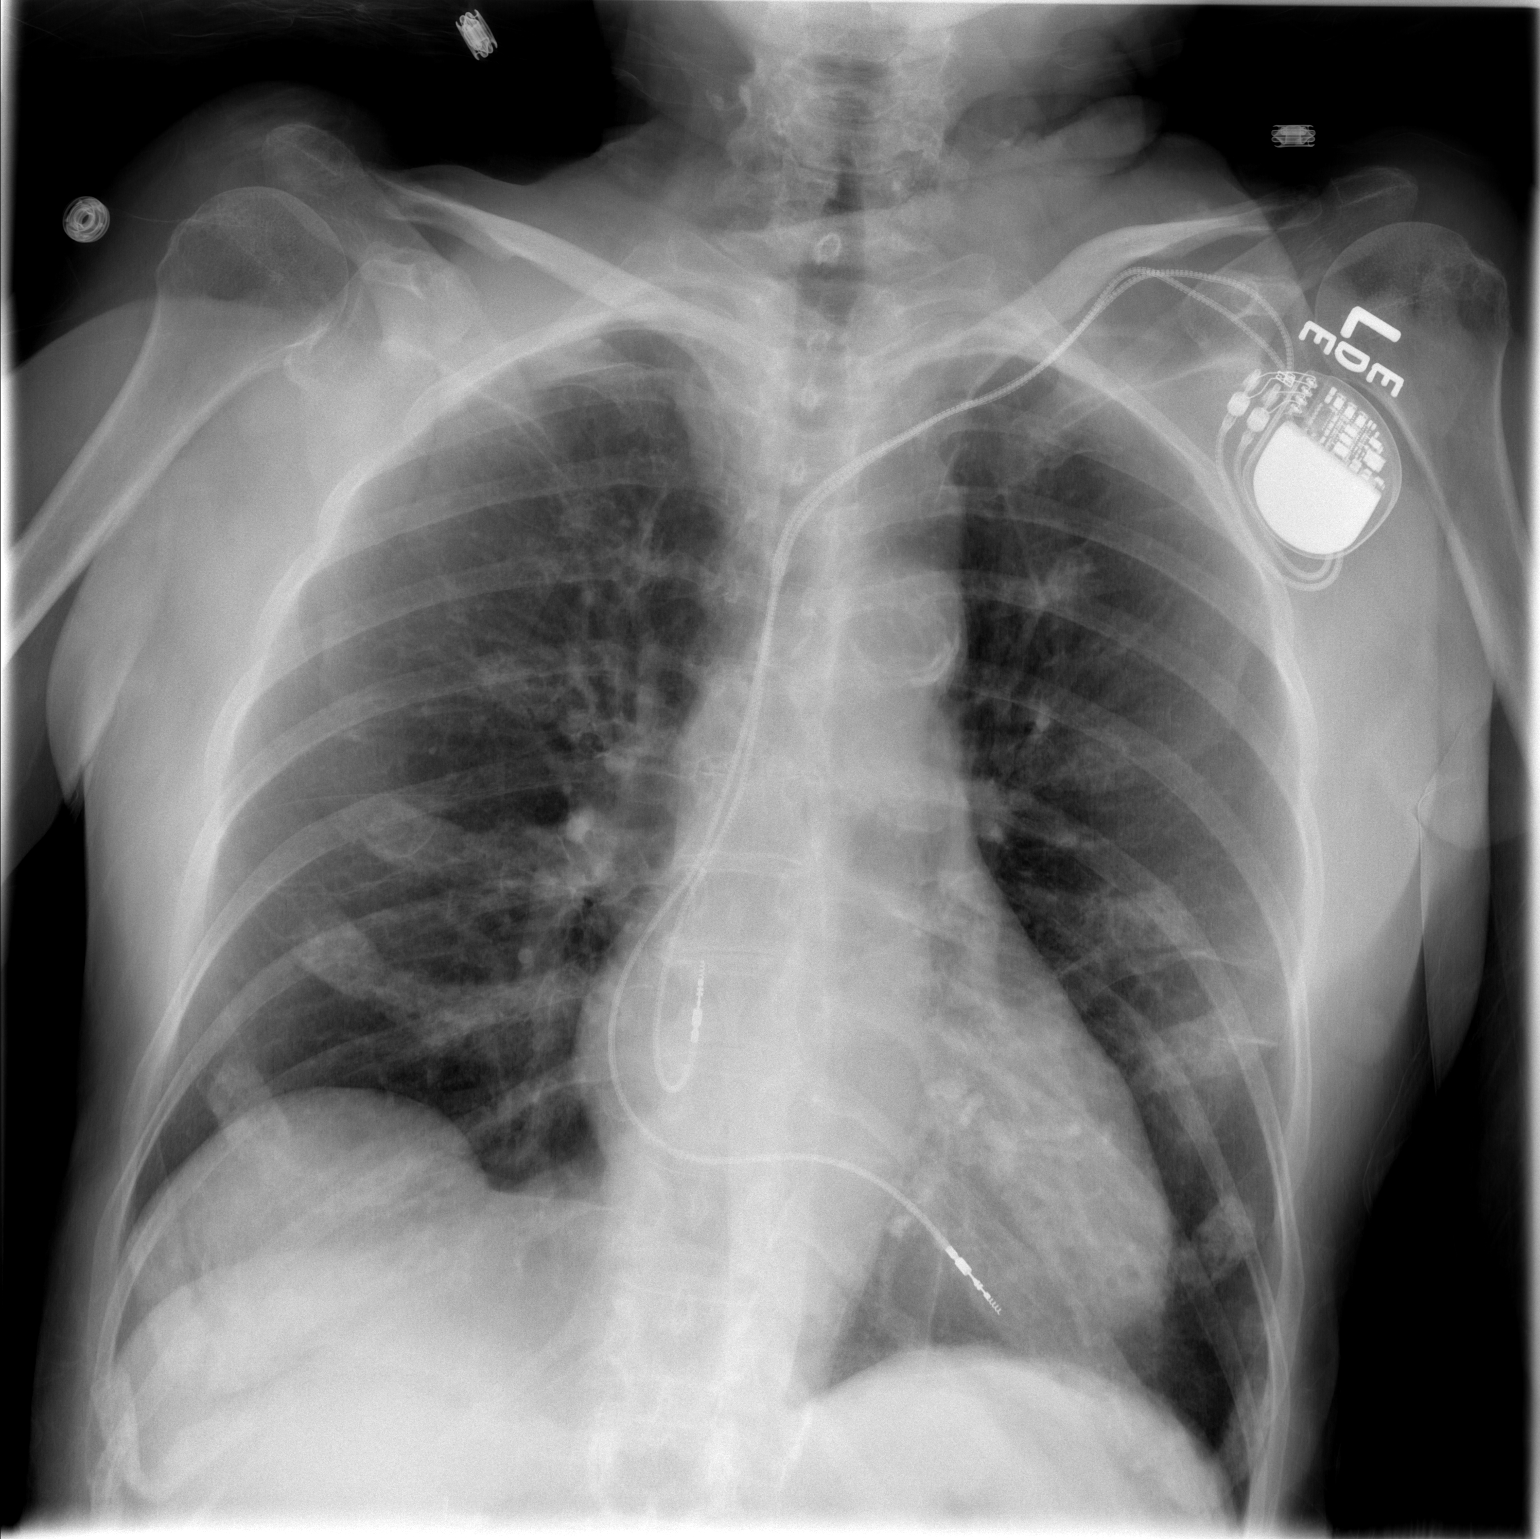

[1 of 1 positions shown; findings below may reference images not displayed]

FINDINGS: Dual lead pacer apparatus overlies the left hemithorax, leads are
stable in position. Stable cardiac and mediastinal contours.
Calcification of the transverse thoracic aorta. Elevation of the
right hemidiaphragm with mild eventration. No consolidative
pulmonary opacities. No pleural effusion or pneumothorax. Regional
skeleton is unremarkable.
IMPRESSION: No acute cardiopulmonary process.

## 2015-09-16 IMAGING — CR DG HIP (WITH OR WITHOUT PELVIS) 2-3V*L*
2 series · 2 of 2 positions shown · non-contrast
Comparison: None

CLINICAL DATA: Status post fall. Left hip pain.

EXAM:
LEFT HIP - COMPLETE 2+ VIEW

[t pelvis a.p.]
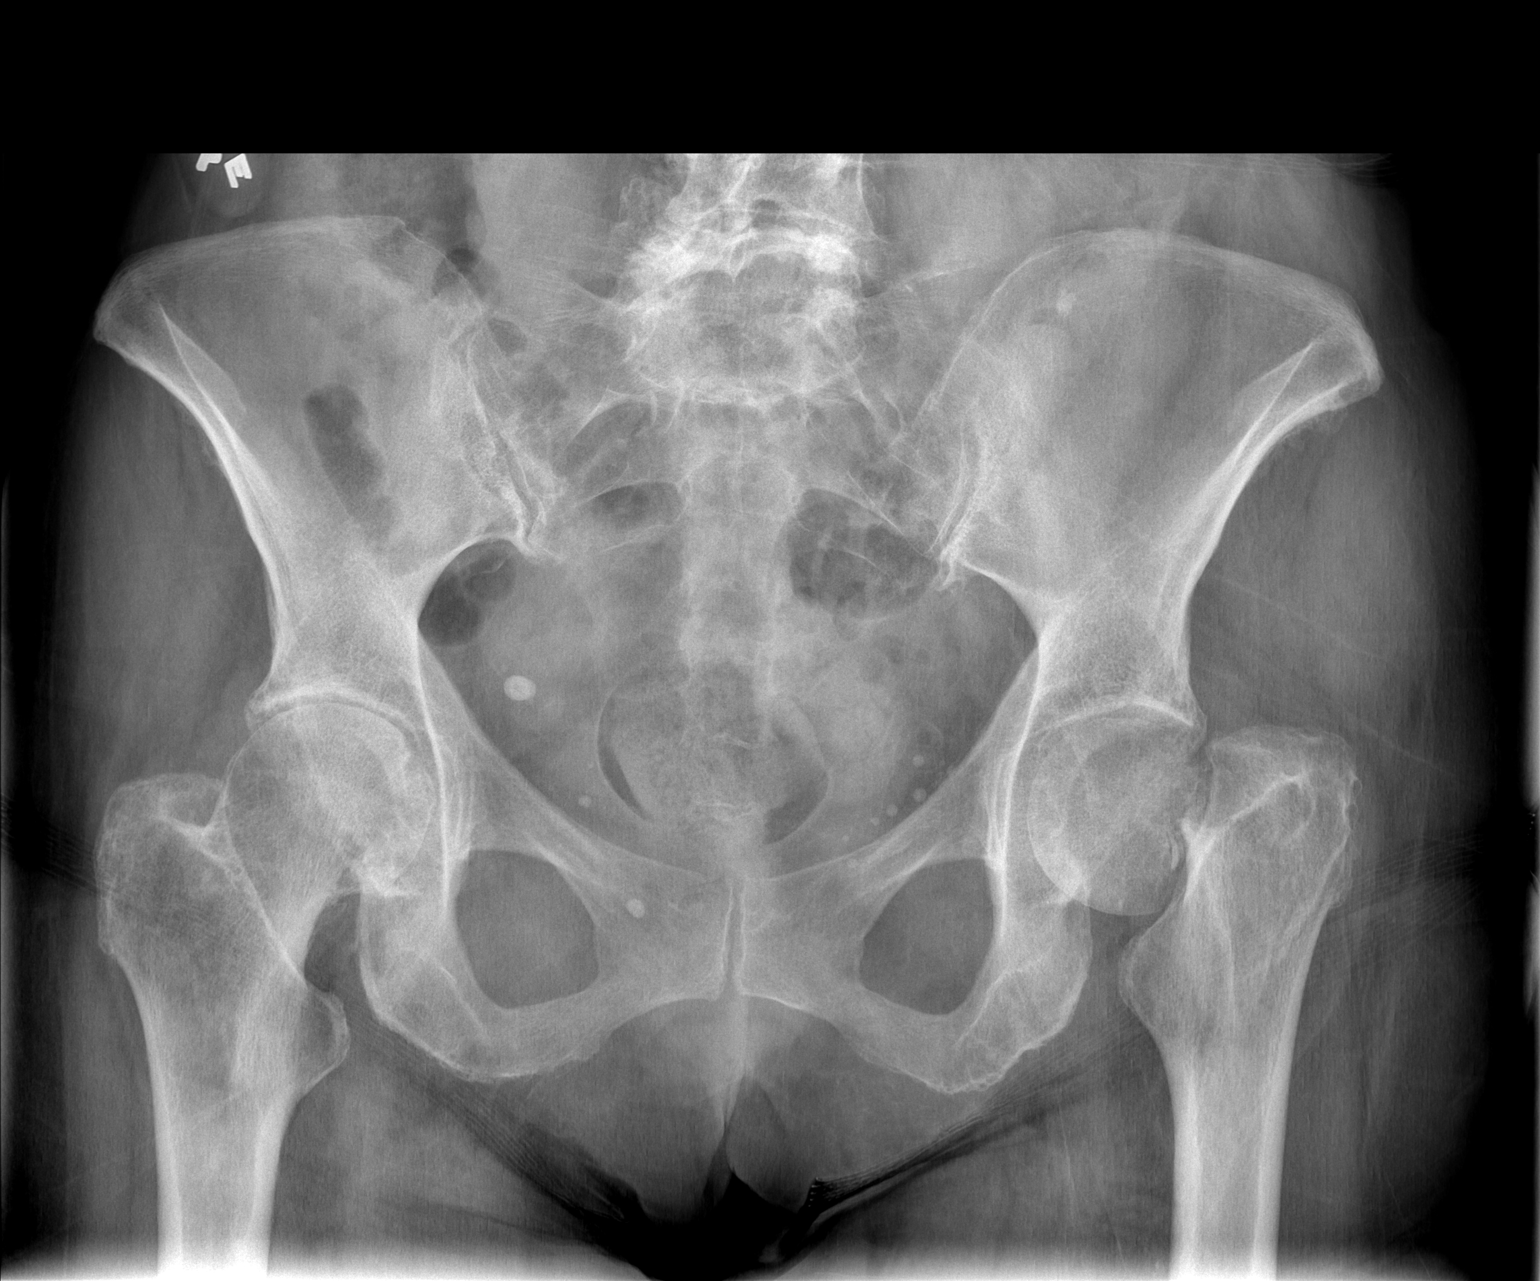

[t hip ap left]
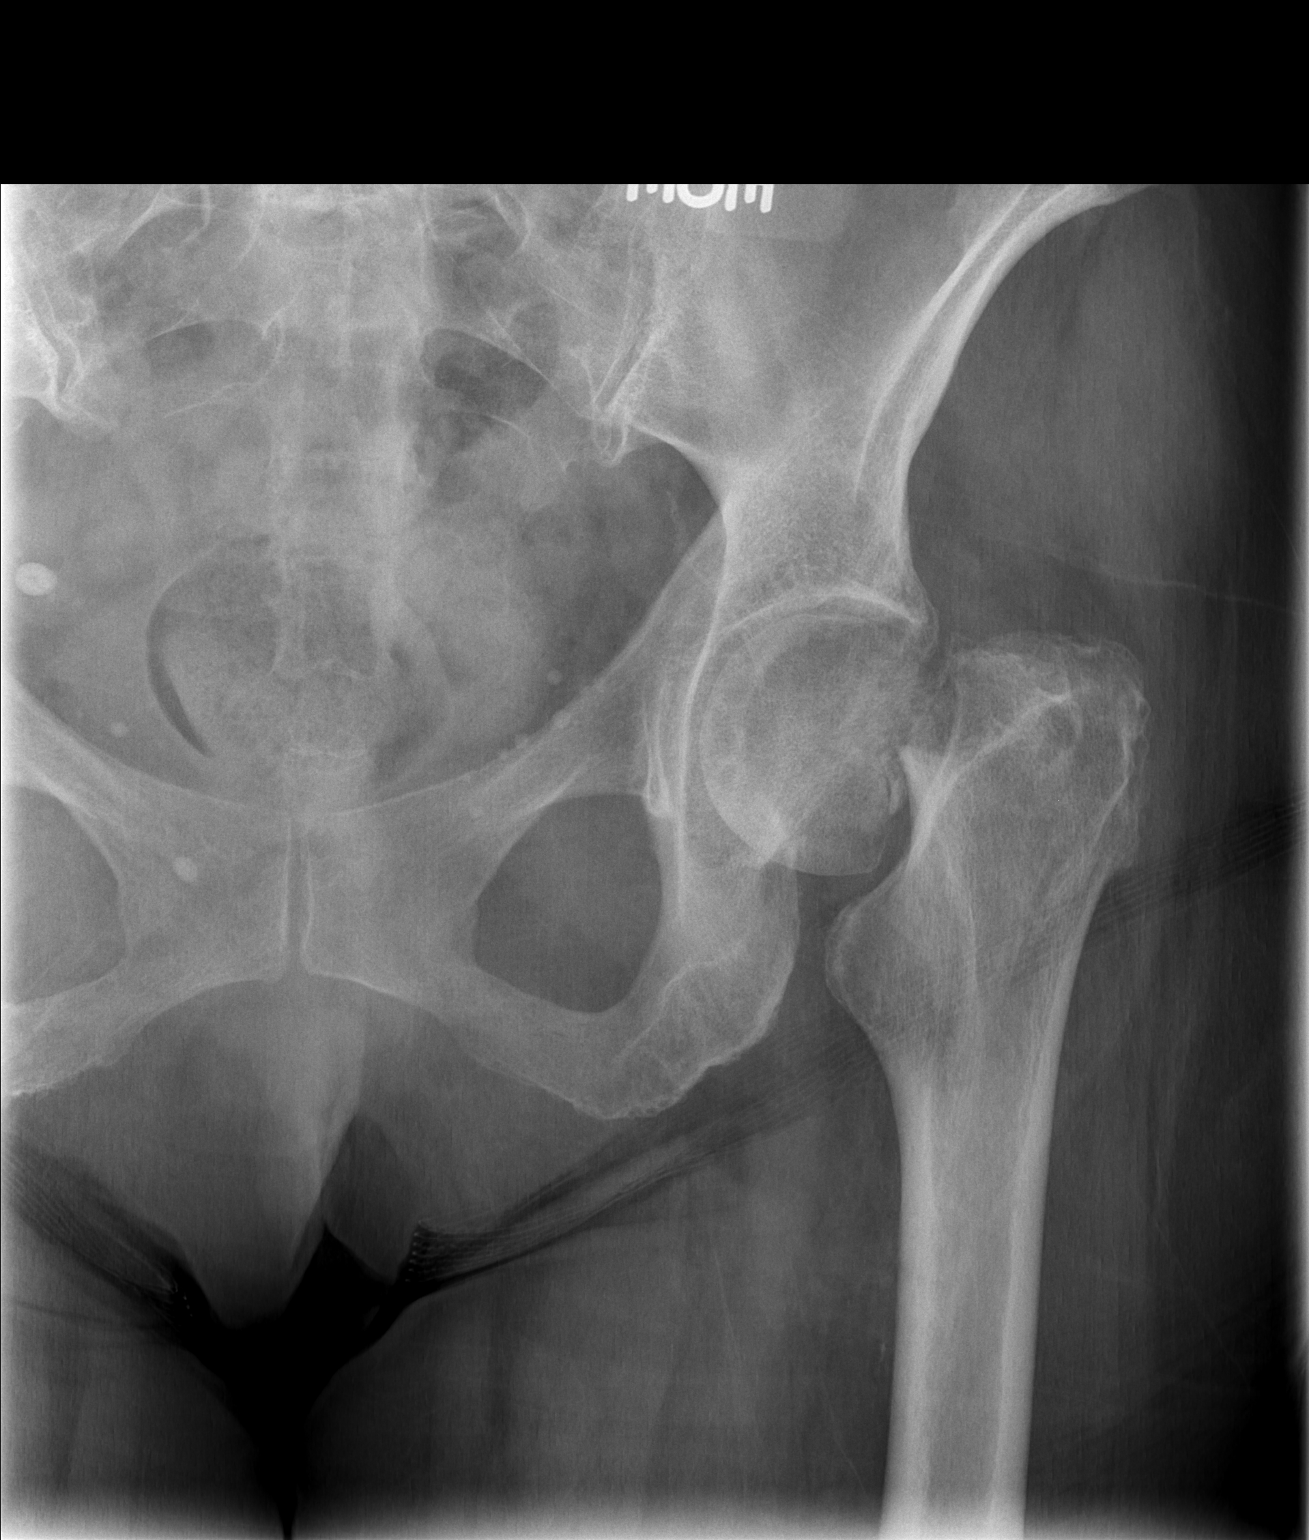

[2 of 2 positions shown; findings below may reference images not displayed]

FINDINGS: There is a displaced complete subcapital left femoral neck fracture.
No evidence for associated acute fractures. Multiple pelvic
phleboliths. Lower lumbar spine degenerative change. Right hip joint
degenerative change.
IMPRESSION: Displaced complete subcapital left femoral neck fracture.

## 2015-09-17 IMAGING — CR DG PORTABLE PELVIS
1 series · 1 of 1 positions shown · non-contrast
Comparison: Portable exam 3303 hr without priors for comparison.

CLINICAL DATA: Postop left hip replacement

EXAM:
PORTABLE PELVIS

[AP]
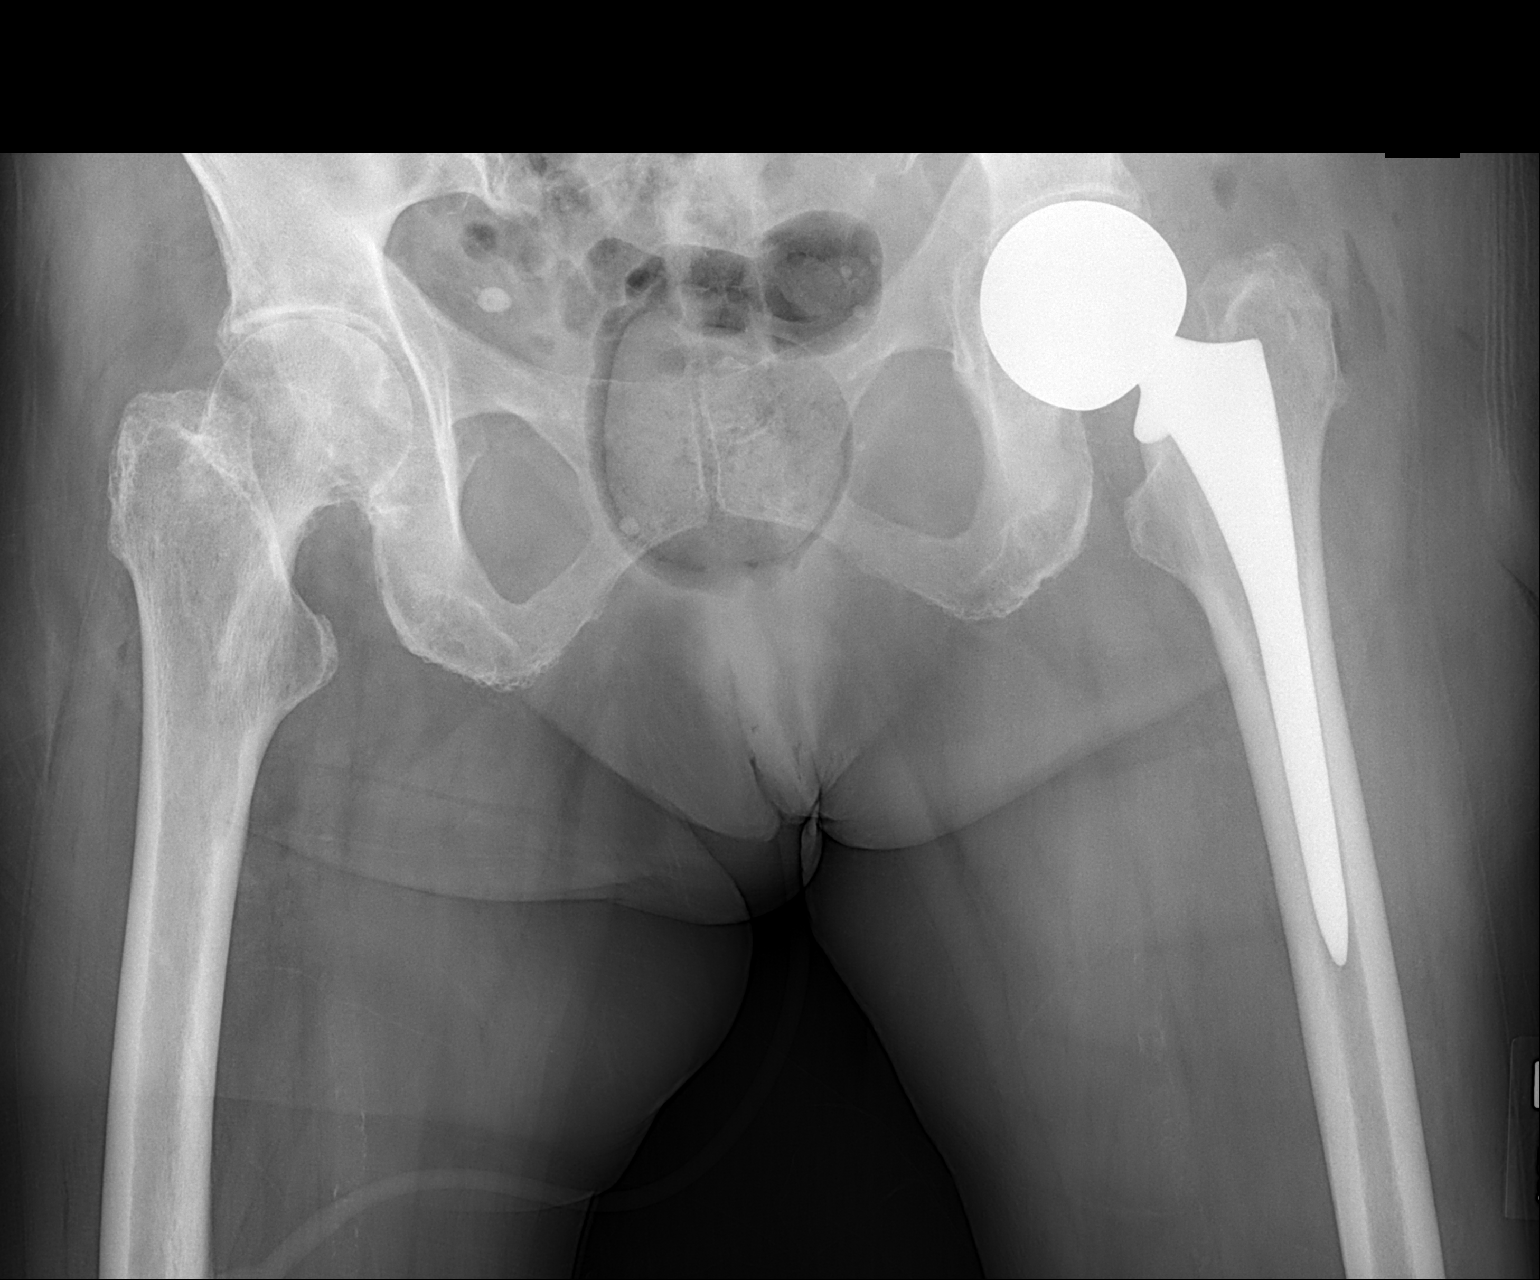

[1 of 1 positions shown; findings below may reference images not displayed]

FINDINGS: Left femoral prosthesis identified in expected position.

No fracture dislocation seen.

Bones appear demineralized.

Mild degenerative changes of right hip joint noted.

Superior pelvis excluded.
IMPRESSION: Left hip prosthesis without acute complication identified.

## 2015-10-28 ENCOUNTER — Ambulatory Visit (INDEPENDENT_AMBULATORY_CARE_PROVIDER_SITE_OTHER): Payer: Medicare Other | Admitting: *Deleted

## 2015-10-28 ENCOUNTER — Ambulatory Visit (INDEPENDENT_AMBULATORY_CARE_PROVIDER_SITE_OTHER): Payer: Medicare Other | Admitting: Internal Medicine

## 2015-10-28 ENCOUNTER — Other Ambulatory Visit (INDEPENDENT_AMBULATORY_CARE_PROVIDER_SITE_OTHER): Payer: Medicare Other

## 2015-10-28 ENCOUNTER — Encounter: Payer: Self-pay | Admitting: Internal Medicine

## 2015-10-28 VITALS — BP 130/72 | HR 90 | Temp 96.9°F | Resp 20 | Ht 68.0 in | Wt 146.0 lb

## 2015-10-28 DIAGNOSIS — I442 Atrioventricular block, complete: Secondary | ICD-10-CM | POA: Diagnosis not present

## 2015-10-28 DIAGNOSIS — R7302 Impaired glucose tolerance (oral): Secondary | ICD-10-CM

## 2015-10-28 DIAGNOSIS — E785 Hyperlipidemia, unspecified: Secondary | ICD-10-CM

## 2015-10-28 DIAGNOSIS — Z23 Encounter for immunization: Secondary | ICD-10-CM

## 2015-10-28 DIAGNOSIS — E039 Hypothyroidism, unspecified: Secondary | ICD-10-CM

## 2015-10-28 DIAGNOSIS — I10 Essential (primary) hypertension: Secondary | ICD-10-CM

## 2015-10-28 LAB — CBC WITH DIFFERENTIAL/PLATELET
BASOS PCT: 0.5 % (ref 0.0–3.0)
Basophils Absolute: 0 10*3/uL (ref 0.0–0.1)
EOS ABS: 0.2 10*3/uL (ref 0.0–0.7)
EOS PCT: 2.6 % (ref 0.0–5.0)
HCT: 42.7 % (ref 36.0–46.0)
Hemoglobin: 14.4 g/dL (ref 12.0–15.0)
LYMPHS ABS: 1.7 10*3/uL (ref 0.7–4.0)
Lymphocytes Relative: 19.4 % (ref 12.0–46.0)
MCHC: 33.7 g/dL (ref 30.0–36.0)
MCV: 92.6 fl (ref 78.0–100.0)
MONO ABS: 1 10*3/uL (ref 0.1–1.0)
Monocytes Relative: 11 % (ref 3.0–12.0)
NEUTROS PCT: 66.5 % (ref 43.0–77.0)
Neutro Abs: 5.8 10*3/uL (ref 1.4–7.7)
PLATELETS: 255 10*3/uL (ref 150.0–400.0)
RBC: 4.61 Mil/uL (ref 3.87–5.11)
RDW: 13.5 % (ref 11.5–15.5)
WBC: 8.7 10*3/uL (ref 4.0–10.5)

## 2015-10-28 LAB — BASIC METABOLIC PANEL
BUN: 23 mg/dL (ref 6–23)
CO2: 24 meq/L (ref 19–32)
CREATININE: 1.08 mg/dL (ref 0.40–1.20)
Calcium: 9.4 mg/dL (ref 8.4–10.5)
Chloride: 102 mEq/L (ref 96–112)
GFR: 50.53 mL/min — AB (ref >60.00)
Glucose, Bld: 98 mg/dL (ref 70–99)
Potassium: 4.1 mEq/L (ref 3.5–5.1)
Sodium: 138 mEq/L (ref 135–145)

## 2015-10-28 LAB — URINALYSIS, ROUTINE W REFLEX MICROSCOPIC
Bilirubin Urine: NEGATIVE
Hgb urine dipstick: NEGATIVE
KETONES UR: NEGATIVE
Leukocytes, UA: NEGATIVE
Nitrite: NEGATIVE
PH: 7 (ref 5.0–8.0)
RBC / HPF: NONE SEEN (ref 0–?)
TOTAL PROTEIN, URINE-UPE24: NEGATIVE
URINE GLUCOSE: NEGATIVE
Urobilinogen, UA: 0.2 (ref 0.0–1.0)
WBC, UA: NONE SEEN (ref 0–?)

## 2015-10-28 LAB — TSH: TSH: 3.1 u[IU]/mL (ref 0.35–4.50)

## 2015-10-28 LAB — CUP PACEART REMOTE DEVICE CHECK
Battery Impedance: 1301 Ohm
Battery Remaining Longevity: 43 mo
Brady Statistic AP VP Percent: 52 %
Brady Statistic AS VS Percent: 5 %
Date Time Interrogation Session: 20170201155852
Implantable Lead Implant Date: 20020920
Implantable Lead Location: 753860
Lead Channel Impedance Value: 558 Ohm
Lead Channel Impedance Value: 638 Ohm
Lead Channel Pacing Threshold Amplitude: 0.75 V
Lead Channel Pacing Threshold Pulse Width: 0.4 ms
Lead Channel Sensing Intrinsic Amplitude: 2.8 mV
Lead Channel Setting Pacing Amplitude: 2 V
Lead Channel Setting Pacing Pulse Width: 0.4 ms
MDC IDC LEAD IMPLANT DT: 20020920
MDC IDC LEAD LOCATION: 753859
MDC IDC MSMT BATTERY VOLTAGE: 2.77 V
MDC IDC MSMT LEADCHNL RA PACING THRESHOLD AMPLITUDE: 0.5 V
MDC IDC MSMT LEADCHNL RV PACING THRESHOLD PULSEWIDTH: 0.4 ms
MDC IDC SET LEADCHNL RV PACING AMPLITUDE: 2.5 V
MDC IDC SET LEADCHNL RV SENSING SENSITIVITY: 2.8 mV
MDC IDC STAT BRADY AP VS PERCENT: 1 %
MDC IDC STAT BRADY AS VP PERCENT: 41 %

## 2015-10-28 LAB — HEPATIC FUNCTION PANEL
ALT: 16 U/L (ref 0–35)
AST: 24 U/L (ref 0–37)
Albumin: 4.1 g/dL (ref 3.5–5.2)
Alkaline Phosphatase: 70 U/L (ref 39–117)
BILIRUBIN TOTAL: 0.6 mg/dL (ref 0.2–1.2)
Bilirubin, Direct: 0.1 mg/dL (ref 0.0–0.3)
Total Protein: 7.2 g/dL (ref 6.0–8.3)

## 2015-10-28 LAB — LIPID PANEL
Cholesterol: 151 mg/dL (ref 0–200)
HDL: 58.1 mg/dL (ref >39.00)
LDL Cholesterol: 66 mg/dL (ref 0–99)
NONHDL: 93.39
TRIGLYCERIDES: 136 mg/dL (ref 0.0–149.0)
Total CHOL/HDL Ratio: 3
VLDL: 27.2 mg/dL (ref 0.0–40.0)

## 2015-10-28 LAB — HEMOGLOBIN A1C: Hgb A1c MFr Bld: 5.7 % (ref 4.6–6.5)

## 2015-10-28 NOTE — Assessment & Plan Note (Signed)
stable overall by history and exam, recent data reviewed with pt, and pt to continue medical treatment as before,  to f/u any worsening symptoms or concerns Lab Results  Component Value Date   LDLCALC 66 10/28/2015    

## 2015-10-28 NOTE — Progress Notes (Signed)
Subjective:    Patient ID: [name], female    DOB: 1922/08/15, 82 y.o.   MRN: [id number]  HPI  Here for yearly f/u with family;  Overall doing ok; Lives in s assisted living ; walks with walker, has cont'd good family support. Hx limited by dementia -  Pt denies Chest pain, worsening SOB, DOE, wheezing, orthopnea, PND, worsening LE edema, palpitations, dizziness or syncope.  Pt denies neurological change such as new headache, facial or extremity weakness.  Pt denies polydipsia, polyuria, or low sugar symptoms. Pt states overall good compliance with treatment and medications, good tolerability, and has been trying to follow appropriate diet.  Pt denies worsening depressive symptoms, suicidal ideation or panic. No fever, night sweats, wt loss, loss of appetite, or other constitutional symptoms.  Pt states good ability with ADL's, has low to mod fall risk, home safety reviewed and adequate, no other significant changes in hearing or vision, and only occasionally active with exercise. Denies hyper or hypo thyroid symptoms such as voice, skin or hair change. Past Medical History  Diagnosis Date  . Aphasia 01/09/2009  . 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 CEREBROVASCULAR ACCIDENT, HX OF 11/20/2007  . CVA 01/09/2009  . DEMENTIA 05/19/2008  . DIVERTICULOSIS, COLON 11/20/2007  . FATIGUE 11/20/2007  . GLUCOSE INTOLERANCE 12/06/2010  . HYPERLIPIDEMIA 11/20/2007  . HYPERTENSION 11/20/2007    Echo was done 07/07/11: mod LVH, EF 55-60%, grade 1 diast dysfxn, mild MR, mild LAE, mod TR, PASP 39  . HYPOTHYROIDISM 11/20/2007  . IRON DEFICIENCY 12/06/2010  . PACEMAKER, PERMANENT 01/09/2009  . TIA 05/14/2003    elevated CE's in setting of TIA and falls in 10/12 - echo normal with no further cardiac w/u pursued  . Urinary incontinence 01/09/2009  . Impaired glucose tolerance 06/03/2011  . TIA (transient ischemic attack) 10/10/2011  . Dementia   . CHF (congestive heart failure) ([place])   . Coronary artery disease   .  Arthritis     right hand  . Pacemaker 2002  . Femoral fracture ([place]) 07/01/2013   Past Surgical History  Procedure Laterality Date  . Medtronic cynthia dual chamber pacemaker serial number was [id number]...04/15/2009    . Abdominal hysterectomy    . S/p cerebral aneurysm  1991  . S/p pacemaker ddd    . Cholecystectomy    . S/p retinal attachment    . S/p thyroidectomy    . Cystocele repair N/A 11/26/2012    Procedure: Cystocele Repair with Lefort Colpocleisis.  colpectomy Levatorplasty Perineorraphy;  Surgeon: Lovenia Kim, MD;  Location: [facility];  Service: Gynecology;  Laterality: N/A;  Cystocele Repair with Lefort Colpocleisis.  . Hip arthroplasty Left 07/02/2013    Procedure: ARTHROPLASTY BIPOLAR HIP;  Surgeon: Mauri Pole, MD;  Location: [facility];  Service: Orthopedics;  Laterality: Left;  . Inguinal hernia repair Right 08/13/2013    Procedure: HERNIA REPAIR INGUINAL INCARCERATED;  Surgeon: Ralene Ok, MD;  Location: [facility];  Service: General;  Laterality: Right;    reports that she has never smoked. She has never used smokeless tobacco. She reports that she does not drink alcohol or use illicit drugs. family history includes Cancer in her mother. Allergies  Allergen Reactions  . Codeine     unknown   Current Outpatient Prescriptions on File Prior to Visit  Medication Sig Dispense Refill  . ALPRAZolam (XANAX) 0.25 MG tablet TAKE 1/2 -1 TABLET BY MOUTH 2 TIMES DAILY AS NEEDED 40 tablet 1  . amLODipine (NORVASC) 10  MG tablet Take 1 tablet (10 mg total) by mouth daily. Resume in 3 days if SBP > 150 90 tablet 3  . Calcium Carbonate-Vitamin D (CALTRATE 600+D) 600-400 MG-UNIT per tablet Take 1 tablet by mouth at bedtime.     . clopidogrel (PLAVIX) 75 MG tablet Take 1 tablet (75 mg total) by mouth daily. 90 tablet 3  . Docusate Sodium (DSS) 100 MG CAPS Take 100 mg by mouth every morning.     . donepezil (ARICEPT) 10 MG tablet Take 1 tablet (10 mg total) by mouth daily. 90 tablet 3   . feeding supplement, ENSURE COMPLETE, (ENSURE COMPLETE) LIQD Take 237 mLs by mouth 2 (two) times daily between meals. (Patient taking differently: Take 237 mLs by mouth 2 (two) times daily between meals as needed. )    . levothyroxine (SYNTHROID, LEVOTHROID) 125 MCG tablet Take 1 tablet (125 mcg total) by mouth daily. 90 tablet 3  . losartan (COZAAR) 100 MG tablet Take 1 tablet (100 mg total) by mouth daily. 90 tablet 3  . Multiple Vitamin (MULTIVITAMIN WITH MINERALS) TABS Take 1 tablet by mouth every morning. Centrum silver    . polyvinyl alcohol (ARTIFICIAL TEARS) 1.4 % ophthalmic solution Place 1 drop into both eyes 4 (four) times daily.     . pravastatin (PRAVACHOL) 40 MG tablet Take 1 tablet (40 mg total) by mouth daily. 90 tablet 3   No current facility-administered medications on file prior to visit.   Review of Systems - limited by dementia Constitutional: Negative for increased diaphoresis, other activity, appetite or siginficant weight change other than noted HENT: Negative for worsening hearing loss, ear pain, facial swelling, mouth sores and neck stiffness.   Eyes: Negative for other worsening pain, redness or visual disturbance.  Respiratory: Negative for shortness of breath and wheezing  Cardiovascular: Negative for chest pain and palpitations.  Gastrointestinal: Negative for diarrhea, blood in stool, abdominal distention or other pain Genitourinary: Negative for hematuria, flank pain or change in urine volume.  Musculoskeletal: Negative for myalgias or other joint complaints.  Skin: Negative for color change and wound or drainage.  Neurological: Negative for syncope and numbness. other than noted Hematological: Negative for adenopathy. or other swelling Psychiatric/Behavioral: Negative for hallucinations, SI, self-injury, decreased concentration or other worsening agitation.      Objective:   Physical Exam BP 130/72 mmHg  Pulse 90  Temp(Src) 96.9 F (36.1 C) (Oral)   Resp 20  Ht 5\' 8"  (1.727 m)  Wt 146 lb (66.225 kg)  BMI 22.20 kg/m2  SpO2 97% VS noted,  Constitutional: Pt is oriented to person, place, and time. Appears well-developed and well-nourished, in no significant distress Head: Normocephalic and atraumatic.  Right Ear: External ear normal.  Left Ear: External ear normal.  Nose: Nose normal.  Mouth/Throat: Oropharynx is clear and moist.  Eyes: Conjunctivae and EOM are normal. Pupils are equal, round, and reactive to light.  Neck: Normal range of motion. Neck supple. No JVD present. No tracheal deviation present or significant neck LA or mass Cardiovascular: Normal rate, regular rhythm, normal heart sounds and intact distal pulses.   Pulmonary/Chest: Effort normal and breath sounds without rales or wheezing  Abdominal: Soft. Bowel sounds are normal. NT. No HSM  Musculoskeletal: Normal range of motion. Exhibits no edema.  Lymphadenopathy:  Has no cervical adenopathy.  Neurological: Pt is alert and oriented to person, place, and time. Pt has normal reflexes. No cranial nerve deficit. Motor grossly intact Skin: Skin is warm and dry. No rash  noted.  Psychiatric:  Has normal mood and affect. Behavior is ok , has some inappropriate laughter    Assessment & Plan:

## 2015-10-28 NOTE — Assessment & Plan Note (Signed)
stable overall by history and exam, recent data reviewed with pt, and pt to continue medical treatment as before,  to f/u any worsening symptoms or concerns Lab Results  Component Value Date   TSH 3.10 10/28/2015

## 2015-10-28 NOTE — Progress Notes (Signed)
Pre visit review using our clinic review tool, if applicable. No additional management support is needed unless otherwise documented below in the visit note. 

## 2015-10-28 NOTE — Assessment & Plan Note (Signed)
Asympt, stable overall by history and exam, recent data reviewed with pt, and pt to continue medical treatment as before,  to f/u any worsening symptoms or concerns Lab Results  Component Value Date   HGBA1C 5.7 10/28/2015

## 2015-10-28 NOTE — Progress Notes (Signed)
Remote pacemaker transmission.   

## 2015-10-28 NOTE — Assessment & Plan Note (Signed)
stable overall by history and exam, recent data reviewed with pt, and pt to continue medical treatment as before,  to f/u any worsening symptoms or concerns BP Readings from Last 3 Encounters:  10/28/15 130/72  07/29/15 122/68  04/23/15 120/64

## 2015-10-28 NOTE — Patient Instructions (Addendum)

## 2015-11-10 ENCOUNTER — Encounter: Payer: Self-pay | Admitting: Cardiology

## 2016-01-27 ENCOUNTER — Ambulatory Visit (INDEPENDENT_AMBULATORY_CARE_PROVIDER_SITE_OTHER): Payer: Medicare Other | Admitting: *Deleted

## 2016-01-27 DIAGNOSIS — I442 Atrioventricular block, complete: Secondary | ICD-10-CM

## 2016-01-28 NOTE — Progress Notes (Signed)
Remote pacemaker transmission.   

## 2016-03-04 ENCOUNTER — Encounter: Payer: Self-pay | Admitting: Cardiology

## 2016-03-07 LAB — CUP PACEART REMOTE DEVICE CHECK
Battery Impedance: 1170 Ohm
Battery Remaining Longevity: 47 mo
Brady Statistic AP VP Percent: 49 %
Brady Statistic AS VP Percent: 47 %
Brady Statistic AS VS Percent: 3 %
Implantable Lead Implant Date: 20020920
Implantable Lead Location: 753859
Implantable Lead Location: 753860
Implantable Lead Model: 5076
Lead Channel Impedance Value: 560 Ohm
Lead Channel Impedance Value: 608 Ohm
Lead Channel Pacing Threshold Amplitude: 0.625 V
Lead Channel Pacing Threshold Amplitude: 0.625 V
Lead Channel Pacing Threshold Pulse Width: 0.4 ms
Lead Channel Sensing Intrinsic Amplitude: 1.4 mV
Lead Channel Setting Pacing Amplitude: 2 V
Lead Channel Setting Pacing Pulse Width: 0.4 ms
MDC IDC LEAD IMPLANT DT: 20020920
MDC IDC MSMT BATTERY VOLTAGE: 2.77 V
MDC IDC MSMT LEADCHNL RA PACING THRESHOLD PULSEWIDTH: 0.4 ms
MDC IDC SESS DTM: 20170503133133
MDC IDC SET LEADCHNL RV PACING AMPLITUDE: 2.5 V
MDC IDC SET LEADCHNL RV SENSING SENSITIVITY: 2.8 mV
MDC IDC STAT BRADY AP VS PERCENT: 1 %

## 2016-04-26 ENCOUNTER — Ambulatory Visit (INDEPENDENT_AMBULATORY_CARE_PROVIDER_SITE_OTHER): Payer: Medicare Other | Admitting: Internal Medicine

## 2016-04-26 ENCOUNTER — Telehealth: Payer: Self-pay

## 2016-04-26 ENCOUNTER — Encounter: Payer: Self-pay | Admitting: Internal Medicine

## 2016-04-26 VITALS — BP 140/80 | HR 81 | Temp 98.5°F | Resp 20 | Wt 149.0 lb

## 2016-04-26 DIAGNOSIS — I10 Essential (primary) hypertension: Secondary | ICD-10-CM | POA: Diagnosis not present

## 2016-04-26 DIAGNOSIS — E785 Hyperlipidemia, unspecified: Secondary | ICD-10-CM | POA: Diagnosis not present

## 2016-04-26 DIAGNOSIS — E039 Hypothyroidism, unspecified: Secondary | ICD-10-CM | POA: Diagnosis not present

## 2016-04-26 DIAGNOSIS — F0390 Unspecified dementia without behavioral disturbance: Secondary | ICD-10-CM | POA: Diagnosis not present

## 2016-04-26 MED ORDER — LEVOTHYROXINE SODIUM 125 MCG PO TABS: 125.0000 ug | ORAL_TABLET | Freq: Every day | ORAL | 3 refills | Status: DC

## 2016-04-26 MED ORDER — LOSARTAN POTASSIUM 100 MG PO TABS: 100.0000 mg | ORAL_TABLET | Freq: Every day | ORAL | 3 refills | Status: DC

## 2016-04-26 MED ORDER — DONEPEZIL HCL 10 MG PO TABS: 10.0000 mg | ORAL_TABLET | Freq: Every day | ORAL | 3 refills | Status: DC

## 2016-04-26 MED ORDER — CLOPIDOGREL BISULFATE 75 MG PO TABS: 75.0000 mg | ORAL_TABLET | Freq: Every day | ORAL | 3 refills | Status: DC

## 2016-04-26 MED ORDER — PRAVASTATIN SODIUM 40 MG PO TABS: 40.0000 mg | ORAL_TABLET | Freq: Every day | ORAL | 3 refills | Status: DC

## 2016-04-26 MED ORDER — AMLODIPINE BESYLATE 10 MG PO TABS: 10.0000 mg | ORAL_TABLET | Freq: Every day | ORAL | 3 refills | Status: DC

## 2016-04-26 NOTE — Patient Instructions (Signed)
Please continue all other medications as before, and refills have been done if requested.  Please have the pharmacy call with any other refills you may need.  Please continue your efforts at being more active, low cholesterol diet, and weight control.  You are otherwise up to date with prevention measures today.  Please keep your appointments with your specialists as you may have planned  Please return in 6 months, or sooner if needed 

## 2016-04-26 NOTE — Progress Notes (Signed)
Pre visit review using our clinic review tool, if applicable. No additional management support is needed unless otherwise documented below in the visit note. 

## 2016-04-26 NOTE — Telephone Encounter (Signed)
Medication refills sent to pharmacy 

## 2016-04-26 NOTE — Progress Notes (Signed)
   Subjective:    Patient ID: [name], female    DOB: Jun 05, 1922, 82 y.o.   MRN: [id number]  HPI  Here to f/u; overall doing ok,  Pt denies chest pain, increasing sob or doe, wheezing, orthopnea, PND, increased LE swelling, palpitations, dizziness or syncope.  Pt denies new neurological symptoms such as new headache, or facial or extremity weakness or numbness.  Pt denies polydipsia, polyuria, or low sugar episode.   Pt denies new neurological symptoms such as new headache, or facial or extremity weakness or numbness.   Pt states overall good compliance with meds, mostly trying to follow appropriate diet, with wt overall stable,  but little exercise however.     Review of Systems     Objective:   Physical Exam        Assessment & Plan:

## 2016-04-27 ENCOUNTER — Ambulatory Visit (INDEPENDENT_AMBULATORY_CARE_PROVIDER_SITE_OTHER): Payer: Medicare Other | Admitting: *Deleted

## 2016-04-27 ENCOUNTER — Telehealth: Payer: Self-pay | Admitting: Cardiology

## 2016-04-27 DIAGNOSIS — I442 Atrioventricular block, complete: Secondary | ICD-10-CM | POA: Diagnosis not present

## 2016-04-27 DIAGNOSIS — Z950 Presence of cardiac pacemaker: Secondary | ICD-10-CM

## 2016-04-27 NOTE — Progress Notes (Signed)
Remote pacemaker transmission.   

## 2016-04-27 NOTE — Telephone Encounter (Signed)
Spoke with pt and reminded pt of remote transmission that is due today. Pt verbalized understanding.   

## 2016-05-01 NOTE — Assessment & Plan Note (Signed)
stable overall by history and exam, and pt to continue medical treatment as before,  to f/u any worsening symptoms or concerns 

## 2016-05-01 NOTE — Assessment & Plan Note (Signed)
stable overall by history and exam, recent data reviewed with pt, and pt to continue medical treatment as before,  to f/u any worsening symptoms or concerns BP Readings from Last 3 Encounters:  04/26/16 140/80  10/28/15 130/72  07/29/15 122/68

## 2016-05-01 NOTE — Progress Notes (Signed)
Subjective:    Patient ID: [name], female    DOB: [birth year], 82 y.o.   MRN: [id number]  HPI  Here to f/u; overall doing ok,  Pt denies chest pain, increasing sob or doe, wheezing, orthopnea, PND, increased LE swelling, palpitations, dizziness or syncope.  Pt denies new neurological symptoms such as new headache, or facial or extremity weakness or numbness.  Pt denies polydipsia, polyuria, or low sugar episode.   Pt denies new neurological symptoms such as new headache, or facial or extremity weakness or numbness.   Pt states overall good compliance with meds, mostly trying to follow appropriate diet, with wt overall stable,  but little exercise however.  Denies hyper or hypo thyroid symptoms such as voice, skin or hair change.  Dementia overall stable symptomatically, and not assoc with behavioral changes such as hallucinations, paranoia, or agitation. Past Medical History:  Diagnosis Date  . Aphasia 01/09/2009  . Arthritis    right hand  . 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 CEREBROVASCULAR ACCIDENT, HX OF 11/20/2007  . CHF (congestive heart failure) ([place])   . Coronary artery disease   . CVA 01/09/2009  . DEMENTIA 05/19/2008  . Dementia   . DIVERTICULOSIS, COLON 11/20/2007  . FATIGUE 11/20/2007  . Femoral fracture ([place]) 07/01/2013  . GLUCOSE INTOLERANCE 12/06/2010  . HYPERLIPIDEMIA 11/20/2007  . HYPERTENSION 11/20/2007   Echo was done 07/07/11: mod LVH, EF 55-60%, grade 1 diast dysfxn, mild MR, mild LAE, mod TR, PASP 39  . HYPOTHYROIDISM 11/20/2007  . Impaired glucose tolerance 06/03/2011  . IRON DEFICIENCY 12/06/2010  . Pacemaker 2002  . PACEMAKER, PERMANENT 01/09/2009  . TIA 05/14/2003   elevated CE's in setting of TIA and falls in 10/12 - echo normal with no further cardiac w/u pursued  . TIA (transient ischemic attack) 10/10/2011  . Urinary incontinence 01/09/2009   Past Surgical History:  Procedure Laterality Date  . ABDOMINAL HYSTERECTOMY    . CHOLECYSTECTOMY    . CYSTOCELE  REPAIR N/A 11/26/2012   Procedure: Cystocele Repair with Lefort Colpocleisis.  colpectomy Levatorplasty Perineorraphy;  Surgeon: Lovenia Kim, MD;  Location: [facility];  Service: Gynecology;  Laterality: N/A;  Cystocele Repair with Lefort Colpocleisis.  Marland Kitchen HIP ARTHROPLASTY Left 07/02/2013   Procedure: ARTHROPLASTY BIPOLAR HIP;  Surgeon: Mauri Pole, MD;  Location: [facility];  Service: Orthopedics;  Laterality: Left;  . INGUINAL HERNIA REPAIR Right 08/13/2013   Procedure: HERNIA REPAIR INGUINAL INCARCERATED;  Surgeon: Ralene Ok, MD;  Location: [facility];  Service: General;  Laterality: Right;  . Medtronic Cynthia dual chamber pacemaker serial number was [id number] H...04/15/2009    . s/p cerebral aneurysm  1991  . s/p pacemaker DDD    . s/p retinal attachment    . s/p thyroidectomy      reports that she has never smoked. She has never used smokeless tobacco. She reports that she does not drink alcohol or use drugs. family history includes Cancer in her mother. Allergies  Allergen Reactions  . Codeine     unknown   Current Outpatient Prescriptions on File Prior to Visit  Medication Sig Dispense Refill  . ALPRAZolam (XANAX) 0.25 MG tablet TAKE 1/2 -1 TABLET BY MOUTH 2 TIMES DAILY AS NEEDED 40 tablet 1  . Calcium Carbonate-Vitamin D (CALTRATE 600+D) 600-400 MG-UNIT per tablet Take 1 tablet by mouth at bedtime.     Mariane Baumgarten Sodium (DSS) 100 MG CAPS Take 100 mg by mouth every morning.     . feeding supplement, ENSURE  COMPLETE, (ENSURE COMPLETE) LIQD Take 237 mLs by mouth 2 (two) times daily between meals. (Patient taking differently: Take 237 mLs by mouth 2 (two) times daily between meals as needed. )    . Multiple Vitamin (MULTIVITAMIN WITH MINERALS) TABS Take 1 tablet by mouth every morning. Centrum silver    . polyvinyl alcohol (ARTIFICIAL TEARS) 1.4 % ophthalmic solution Place 1 drop into both eyes 4 (four) times daily.      No current facility-administered medications on file prior to visit.     Review of Systems  Constitutional: Negative for unusual diaphoresis or night sweats HENT: Negative for ear swelling or discharge Eyes: Negative for worsening visual haziness  Respiratory: Negative for choking and stridor.   Gastrointestinal: Negative for distension or worsening eructation Genitourinary: Negative for retention or change in urine volume.  Musculoskeletal: Negative for other MSK pain or swelling Skin: Negative for color change and worsening wound Neurological: Negative for tremors and numbness other than noted  Psychiatric/Behavioral: Negative for decreased concentration or agitation other than above       Objective:   Physical Exam BP 140/80   Pulse 81   Temp 98.5 F (36.9 C) (Oral)   Resp 20   Wt 149 lb (67.6 kg)   SpO2 96%   BMI 22.66 kg/m  VS noted,  Constitutional: Pt appears in no apparent distress HENT: Head: NCAT.  Right Ear: External ear normal.  Left Ear: External ear normal.  Eyes: . Pupils are equal, round, and reactive to light. Conjunctivae and EOM are normal Neck: Normal range of motion. Neck supple.  Cardiovascular: Normal rate and regular rhythm.   Pulmonary/Chest: Effort normal and breath sounds without rales or wheezing.  Abd:  Soft, NT, ND, + BS Neurological: Pt is alert. At baseline confused , motor grossly intact Skin: Skin is warm. No rash, no LE edema Psychiatric: Pt behavior is normal. No agitation.     Assessment & Plan:

## 2016-05-01 NOTE — Assessment & Plan Note (Signed)
stable overall by history and exam, recent data reviewed with pt, and pt to continue medical treatment as before,  to f/u any worsening symptoms or concerns Lab Results  Component Value Date   LDLCALC 66 10/28/2015

## 2016-05-01 NOTE — Assessment & Plan Note (Signed)
stable overall by history and exam, recent data reviewed with pt, and pt to continue medical treatment as before,  to f/u any worsening symptoms or concerns Lab Results  Component Value Date   TSH 3.10 10/28/2015

## 2016-05-04 ENCOUNTER — Encounter: Payer: Self-pay | Admitting: Cardiology

## 2016-05-13 LAB — CUP PACEART REMOTE DEVICE CHECK
Battery Impedance: 1547 Ohm
Battery Remaining Longevity: 37 mo
Brady Statistic AP VS Percent: 0 %
Brady Statistic AS VS Percent: 2 %
Date Time Interrogation Session: 20170802152748
Implantable Lead Location: 753859
Implantable Lead Model: 5076
Lead Channel Impedance Value: 549 Ohm
Lead Channel Pacing Threshold Amplitude: 0.625 V
Lead Channel Pacing Threshold Pulse Width: 0.4 ms
Lead Channel Pacing Threshold Pulse Width: 0.4 ms
Lead Channel Setting Sensing Sensitivity: 2.8 mV
MDC IDC LEAD IMPLANT DT: 20020920
MDC IDC LEAD IMPLANT DT: 20020920
MDC IDC LEAD LOCATION: 753860
MDC IDC MSMT BATTERY VOLTAGE: 2.77 V
MDC IDC MSMT LEADCHNL RA IMPEDANCE VALUE: 628 Ohm
MDC IDC MSMT LEADCHNL RA SENSING INTR AMPL: 2.8 mV
MDC IDC MSMT LEADCHNL RV PACING THRESHOLD AMPLITUDE: 0.625 V
MDC IDC SET LEADCHNL RA PACING AMPLITUDE: 2 V
MDC IDC SET LEADCHNL RV PACING AMPLITUDE: 2.5 V
MDC IDC SET LEADCHNL RV PACING PULSEWIDTH: 0.4 ms
MDC IDC STAT BRADY AP VP PERCENT: 48 %
MDC IDC STAT BRADY AS VP PERCENT: 50 %

## 2016-08-02 ENCOUNTER — Encounter: Payer: Medicare Other | Admitting: Internal Medicine

## 2016-08-12 ENCOUNTER — Ambulatory Visit (INDEPENDENT_AMBULATORY_CARE_PROVIDER_SITE_OTHER): Payer: Medicare Other | Admitting: Internal Medicine

## 2016-08-12 ENCOUNTER — Encounter: Payer: Self-pay | Admitting: Internal Medicine

## 2016-08-12 VITALS — BP 140/70 | HR 90 | Ht 65.0 in | Wt 152.0 lb

## 2016-08-12 DIAGNOSIS — I442 Atrioventricular block, complete: Secondary | ICD-10-CM | POA: Diagnosis not present

## 2016-08-12 DIAGNOSIS — Z950 Presence of cardiac pacemaker: Secondary | ICD-10-CM

## 2016-08-12 DIAGNOSIS — I480 Paroxysmal atrial fibrillation: Secondary | ICD-10-CM | POA: Diagnosis not present

## 2016-08-12 LAB — CUP PACEART INCLINIC DEVICE CHECK
Battery Impedance: 1659 Ohm
Battery Remaining Longevity: 35 mo
Battery Voltage: 2.76 V
Brady Statistic AP VS Percent: 0 %
Brady Statistic AS VP Percent: 53 %
Brady Statistic AS VS Percent: 1 %
Date Time Interrogation Session: 20171117143929
Implantable Lead Implant Date: 20020920
Implantable Lead Implant Date: 20020920
Implantable Lead Location: 753859
Implantable Lead Location: 753860
Implantable Lead Model: 5076
Implantable Pulse Generator Implant Date: 20100721
Lead Channel Impedance Value: 559 Ohm
Lead Channel Impedance Value: 649 Ohm
Lead Channel Pacing Threshold Amplitude: 0.5 V
Lead Channel Pacing Threshold Amplitude: 0.75 V
Lead Channel Pacing Threshold Pulse Width: 0.4 ms
Lead Channel Pacing Threshold Pulse Width: 0.4 ms
Lead Channel Sensing Intrinsic Amplitude: 11.2 mV
Lead Channel Sensing Intrinsic Amplitude: 2.8 mV
Lead Channel Setting Pacing Amplitude: 2 V
Lead Channel Setting Pacing Pulse Width: 0.4 ms
Lead Channel Setting Sensing Sensitivity: 2.8 mV
MDC IDC SET LEADCHNL RV PACING AMPLITUDE: 2.5 V
MDC IDC STAT BRADY AP VP PERCENT: 46 %

## 2016-08-12 NOTE — Patient Instructions (Signed)
Medication Instructions:  Your physician recommends that you continue on your current medications as directed. Please refer to the Current Medication list given to you today.   Labwork: None Ordered   Testing/Procedures: None Ordered   Follow-Up: Your physician wants you to follow-up in: 1 year with Dr. Taylor. You will receive a reminder letter in the mail two months in advance. If you don't receive a letter, please call our office to schedule the follow-up appointment.  Remote monitoring is used to monitor your Pacemaker from home. This monitoring reduces the number of office visits required to check your device to one time per year. It allows us to keep an eye on the functioning of your device to ensure it is working properly. You are scheduled for a device check from home on 11/14/16. You may send your transmission at any time that day. If you have a wireless device, the transmission will be sent automatically. After your physician reviews your transmission, you will receive a postcard with your next transmission date.    Any Other Special Instructions Will Be Listed Below (If Applicable).     If you need a refill on your cardiac medications before your next appointment, please call your pharmacy.    

## 2016-08-12 NOTE — Progress Notes (Signed)
HPI [name] returns today for followup. She has a history of complete heart block, status post permanent pacemaker insertion. She is a history of cerebral aneurysm and underwent surgical clipping approximately 20 years ago. The procedure was complicated by large stroke. Since then she has had multiple recurrent strokes. She has had a history of atrial fibrillation documented on her pacemaker with pacemaker interrogations. In the past year, she has not fallen. She denies chest pain, shortness of breath, or peripheral edema. The patient denies sob. She is walking daily, living at [facility]. Allergies  Allergen Reactions  . Codeine     unknown     Current Outpatient Prescriptions  Medication Sig Dispense Refill  . ALPRAZolam (XANAX) 0.25 MG tablet TAKE 1/2 -1 TABLET BY MOUTH 2 TIMES DAILY AS NEEDED 40 tablet 1  . amLODipine (NORVASC) 10 MG tablet Take 1 tablet (10 mg total) by mouth daily. Resume in 3 days if SBP > 150 90 tablet 3  . Calcium Carbonate-Vitamin D (CALTRATE 600+D) 600-400 MG-UNIT per tablet Take 1 tablet by mouth at bedtime.     . clopidogrel (PLAVIX) 75 MG tablet Take 1 tablet (75 mg total) by mouth daily. 90 tablet 3  . Docusate Sodium (DSS) 100 MG CAPS Take 100 mg by mouth every morning.     . donepezil (ARICEPT) 10 MG tablet Take 1 tablet (10 mg total) by mouth daily. 90 tablet 3  . feeding supplement, ENSURE COMPLETE, (ENSURE COMPLETE) LIQD Take 237 mLs by mouth 2 (two) times daily between meals.    Marland Kitchen levothyroxine (SYNTHROID, LEVOTHROID) 125 MCG tablet Take 1 tablet (125 mcg total) by mouth daily. 90 tablet 3  . losartan (COZAAR) 100 MG tablet Take 1 tablet (100 mg total) by mouth daily. 90 tablet 3  . Multiple Vitamin (MULTIVITAMIN WITH MINERALS) TABS Take 1 tablet by mouth every morning. Centrum silver    . polyvinyl alcohol (ARTIFICIAL TEARS) 1.4 % ophthalmic solution Place 1 drop into both eyes 4 (four) times daily.     . pravastatin (PRAVACHOL) 40 MG tablet Take 1  tablet (40 mg total) by mouth daily. 90 tablet 3   No current facility-administered medications for this visit.      Past Medical History:  Diagnosis Date  . Aphasia 01/09/2009  . Arthritis    right hand  . 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 CEREBROVASCULAR ACCIDENT, HX OF 11/20/2007  . CHF (congestive heart failure) ([place])   . Coronary artery disease   . CVA 01/09/2009  . DEMENTIA 05/19/2008  . Dementia   . DIVERTICULOSIS, COLON 11/20/2007  . FATIGUE 11/20/2007  . Femoral fracture ([place]) 07/01/2013  . GLUCOSE INTOLERANCE 12/06/2010  . HYPERLIPIDEMIA 11/20/2007  . HYPERTENSION 11/20/2007   Echo was done 07/07/11: mod LVH, EF 55-60%, grade 1 diast dysfxn, mild MR, mild LAE, mod TR, PASP 39  . HYPOTHYROIDISM 11/20/2007  . Impaired glucose tolerance 06/03/2011  . IRON DEFICIENCY 12/06/2010  . Pacemaker 2002  . PACEMAKER, PERMANENT 01/09/2009  . TIA 05/14/2003   elevated CE's in setting of TIA and falls in 10/12 - echo normal with no further cardiac w/u pursued  . TIA (transient ischemic attack) 10/10/2011  . Urinary incontinence 01/09/2009    ROS:   All systems reviewed and negative except as noted in the HPI.   Past Surgical History:  Procedure Laterality Date  . ABDOMINAL HYSTERECTOMY    . CHOLECYSTECTOMY    . CYSTOCELE REPAIR N/A 11/26/2012   Procedure: Cystocele Repair with Lefort Colpocleisis.  colpectomy Levatorplasty Perineorraphy;  Surgeon: Lovenia Kim, MD;  Location: [facility];  Service: Gynecology;  Laterality: N/A;  Cystocele Repair with Lefort Colpocleisis.  Marland Kitchen HIP ARTHROPLASTY Left 07/02/2013   Procedure: ARTHROPLASTY BIPOLAR HIP;  Surgeon: Mauri Pole, MD;  Location: [facility];  Service: Orthopedics;  Laterality: Left;  . INGUINAL HERNIA REPAIR Right 08/13/2013   Procedure: HERNIA REPAIR INGUINAL INCARCERATED;  Surgeon: Ralene Ok, MD;  Location: [facility];  Service: General;  Laterality: Right;  . Medtronic Cynthia dual chamber pacemaker serial number was  [id number] H...04/15/2009    . s/p cerebral aneurysm  1991  . s/p pacemaker DDD    . s/p retinal attachment    . s/p thyroidectomy       Family History  Problem Relation Age of Onset  . Cancer Mother     breast cancer     Social History   Social History  . Marital status: Divorced    Spouse name: N/A  . Number of children: N/A  . Years of education: N/A   Occupational History  . retired Radiation protection practitioner, lives at [place]  . Smoking status: Never Smoker  . Smokeless tobacco: Never Used  . Alcohol use No  . Drug use: No  . Sexual activity: Not on file   Other Topics Concern  . Not on file   Social History Narrative  . No narrative on file     BP 140/70   Pulse 90   Ht 5\' 5"  (1.651 m)   Wt 152 lb (68.9 kg)   SpO2 97%   BMI 25.29 kg/m   Physical Exam:  Elderly appearing, elderly woman, NAD HEENT: Unremarkable Neck:  8 cm JVD, no thyromegally Lungs:  Clear with no wheezes, rales, or rhonchi. HEART:  Regular rate rhythm, no murmurs, no rubs, no clicks Abd:  soft, positive bowel sounds, no organomegally, no rebound, no guarding Ext:  2 plus pulses, no edema, no cyanosis, no clubbing Skin:  No rashes no nodules Neuro:  CN II through XII intact, motor grossly intact, some difficulty with speech   DEVICE  Normal device function.  See PaceArt for details. She has had no ventricular arrhythmias. Her underlying rhythm is complete heart block with a ventricular escape of 40 beats per minute  Assess/Plan:  1. Atrial fib - she is out of rhythm 1% of the time. She will continue her current meds. She is not a candidate for systemic anti-coagulation 2. HTN - her blood pressure is reasonably well controlled. Will follow. She is encouraged to maintain a low sodium diet. 3. PPM - her medtronic DDD PM is working normally. Will recheck in several months.  Mikle Bosworth.D.

## 2016-09-21 DIAGNOSIS — L84 Corns and callosities: Secondary | ICD-10-CM | POA: Diagnosis not present

## 2016-09-21 DIAGNOSIS — B351 Tinea unguium: Secondary | ICD-10-CM | POA: Diagnosis not present

## 2016-10-27 ENCOUNTER — Ambulatory Visit (INDEPENDENT_AMBULATORY_CARE_PROVIDER_SITE_OTHER)
Admission: RE | Admit: 2016-10-27 | Discharge: 2016-10-27 | Disposition: A | Discharge status: Home / Self Care | Payer: Medicare Other | Source: Ambulatory Visit | Attending: Internal Medicine | Admitting: Internal Medicine

## 2016-10-27 ENCOUNTER — Encounter: Payer: Self-pay | Admitting: Internal Medicine

## 2016-10-27 ENCOUNTER — Ambulatory Visit (INDEPENDENT_AMBULATORY_CARE_PROVIDER_SITE_OTHER): Payer: Medicare Other | Admitting: Internal Medicine

## 2016-10-27 ENCOUNTER — Other Ambulatory Visit (INDEPENDENT_AMBULATORY_CARE_PROVIDER_SITE_OTHER): Payer: Medicare Other

## 2016-10-27 ENCOUNTER — Other Ambulatory Visit: Payer: Medicare Other

## 2016-10-27 VITALS — BP 124/72 | HR 88 | Temp 98.2°F | Resp 20 | Wt 152.0 lb

## 2016-10-27 DIAGNOSIS — E039 Hypothyroidism, unspecified: Secondary | ICD-10-CM | POA: Diagnosis not present

## 2016-10-27 DIAGNOSIS — E2839 Other primary ovarian failure: Secondary | ICD-10-CM

## 2016-10-27 DIAGNOSIS — E785 Hyperlipidemia, unspecified: Secondary | ICD-10-CM

## 2016-10-27 DIAGNOSIS — Z23 Encounter for immunization: Secondary | ICD-10-CM

## 2016-10-27 DIAGNOSIS — R7302 Impaired glucose tolerance (oral): Secondary | ICD-10-CM | POA: Diagnosis not present

## 2016-10-27 DIAGNOSIS — I10 Essential (primary) hypertension: Secondary | ICD-10-CM | POA: Diagnosis not present

## 2016-10-27 DIAGNOSIS — R32 Unspecified urinary incontinence: Secondary | ICD-10-CM | POA: Diagnosis not present

## 2016-10-27 LAB — CBC WITH DIFFERENTIAL/PLATELET
BASOS ABS: 0.1 10*3/uL (ref 0.0–0.1)
Basophils Relative: 1.1 % (ref 0.0–3.0)
Eosinophils Absolute: 0.2 10*3/uL (ref 0.0–0.7)
Eosinophils Relative: 2.7 % (ref 0.0–5.0)
HEMATOCRIT: 41.5 % (ref 36.0–46.0)
Hemoglobin: 13.9 g/dL (ref 12.0–15.0)
LYMPHS PCT: 29 % (ref 12.0–46.0)
Lymphs Abs: 1.8 10*3/uL (ref 0.7–4.0)
MCHC: 33.4 g/dL (ref 30.0–36.0)
MCV: 94.4 fl (ref 78.0–100.0)
MONOS PCT: 11.9 % (ref 3.0–12.0)
Monocytes Absolute: 0.8 10*3/uL (ref 0.1–1.0)
NEUTROS ABS: 3.5 10*3/uL (ref 1.4–7.7)
Neutrophils Relative %: 55.3 % (ref 43.0–77.0)
PLATELETS: 268 10*3/uL (ref 150.0–400.0)
RBC: 4.4 Mil/uL (ref 3.87–5.11)
RDW: 12.7 % (ref 11.5–15.5)
WBC: 6.3 10*3/uL (ref 4.0–10.5)

## 2016-10-27 LAB — URINALYSIS, ROUTINE W REFLEX MICROSCOPIC
Bilirubin Urine: NEGATIVE
Hgb urine dipstick: NEGATIVE
Ketones, ur: NEGATIVE
LEUKOCYTES UA: NEGATIVE
Nitrite: NEGATIVE
Specific Gravity, Urine: 1.01 (ref 1.000–1.030)
TOTAL PROTEIN, URINE-UPE24: NEGATIVE
Urine Glucose: NEGATIVE
Urobilinogen, UA: 0.2 (ref 0.0–1.0)
pH: 6 (ref 5.0–8.0)

## 2016-10-27 LAB — LIPID PANEL
CHOL/HDL RATIO: 3
Cholesterol: 159 mg/dL (ref 0–200)
HDL: 55.1 mg/dL (ref >39.00)
LDL CALC: 69 mg/dL (ref 0–99)
NONHDL: 103.75
Triglycerides: 172 mg/dL — ABNORMAL HIGH (ref 0.0–149.0)
VLDL: 34.4 mg/dL (ref 0.0–40.0)

## 2016-10-27 LAB — HEMOGLOBIN A1C: Hgb A1c MFr Bld: 5.6 % (ref 4.6–6.5)

## 2016-10-27 LAB — HEPATIC FUNCTION PANEL
ALT: 29 U/L (ref 0–35)
AST: 20 U/L (ref 0–37)
Albumin: 4.3 g/dL (ref 3.5–5.2)
Alkaline Phosphatase: 61 U/L (ref 39–117)
BILIRUBIN DIRECT: 0.1 mg/dL (ref 0.0–0.3)
BILIRUBIN TOTAL: 0.5 mg/dL (ref 0.2–1.2)
TOTAL PROTEIN: 6.4 g/dL (ref 6.0–8.3)

## 2016-10-27 LAB — BASIC METABOLIC PANEL
BUN: 23 mg/dL (ref 6–23)
CALCIUM: 9.3 mg/dL (ref 8.4–10.5)
CO2: 28 mEq/L (ref 19–32)
Chloride: 103 mEq/L (ref 96–112)
Creatinine, Ser: 1.05 mg/dL (ref 0.40–1.20)
GFR: 52.08 mL/min — AB (ref >60.00)
Glucose, Bld: 95 mg/dL (ref 70–99)
Potassium: 3.6 mEq/L (ref 3.5–5.1)
Sodium: 138 mEq/L (ref 135–145)

## 2016-10-27 LAB — TSH: TSH: 2.82 u[IU]/mL (ref 0.35–4.50)

## 2016-10-27 NOTE — Patient Instructions (Addendum)
You had the flu shot today  Please schedule the bone density test before leaving today at the scheduling desk (where you check out)  Please continue all other medications as before, and refills have been done if requested.  Please have the pharmacy call with any other refills you may need.  Please continue your efforts at being more active, low cholesterol diet, and weight control.  You are otherwise up to date with prevention measures today.  You will be contacted regarding the referral for: urology  Please keep your appointments with your specialists as you may have planned  Please go to the LAB in the Basement (turn left off the elevator) for the tests to be done today  You will be contacted by phone if any changes need to be made immediately.  Otherwise, you will receive a letter about your results with an explanation, but please check with MyChart first.  Please remember to sign up for MyChart if you have not done so, as this will be important to you in the future with finding out test results, communicating by private email, and scheduling acute appointments online when needed.  Please return in 6 months, or sooner if needed

## 2016-10-27 NOTE — Progress Notes (Signed)
Pre visit review using our clinic review tool, if applicable. No additional management support is needed unless otherwise documented below in the visit note. 

## 2016-10-27 NOTE — Assessment & Plan Note (Signed)
stable overall by history and exam, recent data reviewed with pt, and pt to continue medical treatment as before,  to f/u any worsening symptoms or concerns Lab Results  Component Value Date   LDLCALC 66 10/28/2015

## 2016-10-27 NOTE — Assessment & Plan Note (Signed)
Lab Results  Component Value Date   TSH 3.10 10/28/2015   stable overall by history and exam, recent data reviewed with pt, and pt to continue medical treatment as before,  to f/u any worsening symptoms or concerns

## 2016-10-27 NOTE — Assessment & Plan Note (Addendum)
stable overall by history and exam, recent data reviewed with pt, and pt to continue medical treatment as before,  to f/u any worsening symptoms or concerns BP Readings from Last 3 Encounters:  10/27/16 124/72  08/12/16 140/70  04/26/16 140/80   Note:  Total time for pt hx, exam, review of record with pt in the room, determination of diagnoses and plan for further eval and tx is > 40 min, with over 50% spent in coordination and counseling of patient and family

## 2016-10-27 NOTE — Progress Notes (Signed)
Subjective:    Patient ID: [name], female    DOB: 08-Feb-1922, 83 y.o.   MRN: [id number]  HPI Here for yearly f/u with family  Overall doing ok;  Pt denies Chest pain, worsening SOB, DOE, wheezing, orthopnea, PND, worsening LE edema, palpitations, dizziness or syncope.  Pt denies neurological change such as new headache, facial or extremity weakness.  Pt denies polydipsia, polyuria, or low sugar symptoms. Pt states overall good compliance with treatment and medications, good tolerability, and has been trying to follow appropriate diet.  Pt denies worsening depressive symptoms, suicidal ideation or panic. No fever, night sweats, wt loss, loss of appetite, or other constitutional symptoms.  Pt states good ability with ADL's, has low fall risk, home safety reviewed and adequate, no other significant changes in hearing or vision, and not active with exercise, but is able to ambulate well with rolling walker. Has c/o worsening chronic seemingly free flowing urinary incontinence, using over 10 changes in depends per day Past Medical History:  Diagnosis Date  . Aphasia 01/09/2009  . Arthritis    right hand  . 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 CEREBROVASCULAR ACCIDENT, HX OF 11/20/2007  . CHF (congestive heart failure) ([place])   . Coronary artery disease   . CVA 01/09/2009  . DEMENTIA 05/19/2008  . Dementia   . DIVERTICULOSIS, COLON 11/20/2007  . FATIGUE 11/20/2007  . Femoral fracture ([place]) 07/01/2013  . GLUCOSE INTOLERANCE 12/06/2010  . HYPERLIPIDEMIA 11/20/2007  . HYPERTENSION 11/20/2007   Echo was done 07/07/11: mod LVH, EF 55-60%, grade 1 diast dysfxn, mild MR, mild LAE, mod TR, PASP 39  . HYPOTHYROIDISM 11/20/2007  . Impaired glucose tolerance 06/03/2011  . IRON DEFICIENCY 12/06/2010  . Pacemaker 2002  . PACEMAKER, PERMANENT 01/09/2009  . TIA 05/14/2003   elevated CE's in setting of TIA and falls in 10/12 - echo normal with no further cardiac w/u pursued  . TIA (transient ischemic attack)  10/10/2011  . Urinary incontinence 01/09/2009   Past Surgical History:  Procedure Laterality Date  . ABDOMINAL HYSTERECTOMY    . CHOLECYSTECTOMY    . CYSTOCELE REPAIR N/A 11/26/2012   Procedure: Cystocele Repair with Lefort Colpocleisis.  colpectomy Levatorplasty Perineorraphy;  Surgeon: Lovenia Kim, MD;  Location: [facility];  Service: Gynecology;  Laterality: N/A;  Cystocele Repair with Lefort Colpocleisis.  Marland Kitchen HIP ARTHROPLASTY Left 07/02/2013   Procedure: ARTHROPLASTY BIPOLAR HIP;  Surgeon: Mauri Pole, MD;  Location: [facility];  Service: Orthopedics;  Laterality: Left;  . INGUINAL HERNIA REPAIR Right 08/13/2013   Procedure: HERNIA REPAIR INGUINAL INCARCERATED;  Surgeon: Ralene Ok, MD;  Location: [facility];  Service: General;  Laterality: Right;  . Medtronic Cynthia dual chamber pacemaker serial number was [id number] H...04/15/2009    . s/p cerebral aneurysm  1991  . s/p pacemaker DDD    . s/p retinal attachment    . s/p thyroidectomy      reports that she has never smoked. She has never used smokeless tobacco. She reports that she does not drink alcohol or use drugs. family history includes Cancer in her mother. Allergies  Allergen Reactions  . Codeine     unknown   Current Outpatient Prescriptions on File Prior to Visit  Medication Sig Dispense Refill  . ALPRAZolam (XANAX) 0.25 MG tablet TAKE 1/2 -1 TABLET BY MOUTH 2 TIMES DAILY AS NEEDED 40 tablet 1  . amLODipine (NORVASC) 10 MG tablet Take 1 tablet (10 mg total) by mouth daily. Resume in 3 days if SBP >  150 90 tablet 3  . Calcium Carbonate-Vitamin D (CALTRATE 600+D) 600-400 MG-UNIT per tablet Take 1 tablet by mouth at bedtime.     . clopidogrel (PLAVIX) 75 MG tablet Take 1 tablet (75 mg total) by mouth daily. 90 tablet 3  . Docusate Sodium (DSS) 100 MG CAPS Take 100 mg by mouth every morning.     . donepezil (ARICEPT) 10 MG tablet Take 1 tablet (10 mg total) by mouth daily. 90 tablet 3  . feeding supplement, ENSURE COMPLETE,  (ENSURE COMPLETE) LIQD Take 237 mLs by mouth 2 (two) times daily between meals.    Marland Kitchen levothyroxine (SYNTHROID, LEVOTHROID) 125 MCG tablet Take 1 tablet (125 mcg total) by mouth daily. 90 tablet 3  . losartan (COZAAR) 100 MG tablet Take 1 tablet (100 mg total) by mouth daily. 90 tablet 3  . Multiple Vitamin (MULTIVITAMIN WITH MINERALS) TABS Take 1 tablet by mouth every morning. Centrum silver    . polyvinyl alcohol (ARTIFICIAL TEARS) 1.4 % ophthalmic solution Place 1 drop into both eyes 4 (four) times daily.     . pravastatin (PRAVACHOL) 40 MG tablet Take 1 tablet (40 mg total) by mouth daily. 90 tablet 3   No current facility-administered medications on file prior to visit.    Review of Systems Unable due to dementia    Objective:   Physical Exam BP 124/72   Pulse 88   Temp 98.2 F (36.8 C) (Oral)   Resp 20   Wt 152 lb (68.9 kg)   SpO2 96%   BMI 25.29 kg/m  VS noted, frail, HOH and talks very loudly, walks with rolling walker Needs only mild assist to get up on exam table Constitutional:. Appears well-developed and well-nourished, in no significant distress Head: Normocephalic and atraumatic  Eyes: Conjunctivae and EOM are normal. Pupils are equal, round, and reactive to light Right Ear: External ear normal.  Left Ear: External ear normal Nose: Nose normal.  Mouth/Throat: Oropharynx is clear and moist  Neck: Normal range of motion. Neck supple. No JVD present. No tracheal deviation present or significant neck LA or mass Cardiovascular: Normal rate, regular rhythm, normal heart sounds and intact distal pulses.   Pulmonary/Chest: Effort normal and breath sounds without rales or wheezing  Abdominal: Soft. Bowel sounds are normal. NT. No HSM  Musculoskeletal: Normal range of motion. Exhibits no edema Lymphadenopathy: Has no cervical adenopathy.  Neurological: Pt is alert and oriented to person only. Pt has normal reflexes. No cranial nerve deficit. Motor grossly intact Skin: Skin  is warm and dry. No rash noted or new ulcers Psychiatric:  Has normal mood and affect. Behavior is normal.  No other new exam findings    Assessment & Plan:

## 2016-10-27 NOTE — Assessment & Plan Note (Signed)
stable overall by history and exam, recent data reviewed with pt, and pt to continue medical treatment as before,  to f/u any worsening symptoms or concerns Lab Results  Component Value Date   HGBA1C 5.7 10/28/2015

## 2016-10-27 NOTE — Assessment & Plan Note (Signed)
Severe, worsening, for ua and cx, also refer urology

## 2016-10-28 LAB — URINE CULTURE

## 2016-11-03 ENCOUNTER — Encounter: Payer: Self-pay | Admitting: Internal Medicine

## 2016-11-14 ENCOUNTER — Ambulatory Visit (INDEPENDENT_AMBULATORY_CARE_PROVIDER_SITE_OTHER): Payer: Medicare Other | Admitting: *Deleted

## 2016-11-14 ENCOUNTER — Telehealth: Payer: Self-pay | Admitting: Cardiology

## 2016-11-14 DIAGNOSIS — I442 Atrioventricular block, complete: Secondary | ICD-10-CM | POA: Diagnosis not present

## 2016-11-14 NOTE — Telephone Encounter (Signed)
Confirmed remote transmission w/ pt daughter in law.   

## 2016-11-15 ENCOUNTER — Encounter: Payer: Self-pay | Admitting: Cardiology

## 2016-11-15 LAB — CUP PACEART REMOTE DEVICE CHECK
Battery Remaining Longevity: 31 mo
Brady Statistic AS VS Percent: 0 %
Date Time Interrogation Session: 20180219192941
Implantable Lead Implant Date: 20020920
Implantable Lead Implant Date: 20020920
Implantable Lead Location: 753859
Implantable Lead Model: 5076
Implantable Pulse Generator Implant Date: 20100721
Lead Channel Impedance Value: 582 Ohm
Lead Channel Impedance Value: 609 Ohm
Lead Channel Pacing Threshold Amplitude: 0.625 V
Lead Channel Pacing Threshold Pulse Width: 0.4 ms
Lead Channel Setting Pacing Amplitude: 2 V
Lead Channel Setting Pacing Amplitude: 2.5 V
Lead Channel Setting Sensing Sensitivity: 2.8 mV
MDC IDC LEAD LOCATION: 753860
MDC IDC MSMT BATTERY IMPEDANCE: 1887 Ohm
MDC IDC MSMT BATTERY VOLTAGE: 2.76 V
MDC IDC MSMT LEADCHNL RA PACING THRESHOLD AMPLITUDE: 0.5 V
MDC IDC MSMT LEADCHNL RA PACING THRESHOLD PULSEWIDTH: 0.4 ms
MDC IDC SET LEADCHNL RV PACING PULSEWIDTH: 0.4 ms
MDC IDC STAT BRADY AP VP PERCENT: 38 %
MDC IDC STAT BRADY AP VS PERCENT: 0 %
MDC IDC STAT BRADY AS VP PERCENT: 62 %

## 2016-11-15 NOTE — Progress Notes (Signed)
Remote pacemaker transmission.   

## 2016-12-21 DIAGNOSIS — B351 Tinea unguium: Secondary | ICD-10-CM | POA: Diagnosis not present

## 2017-01-17 ENCOUNTER — Inpatient Hospital Stay (HOSPITAL_COMMUNITY)
Admission: EM | Admit: 2017-01-17 | Discharge: 2017-01-21 | DRG: 177 | Disposition: A | Discharge status: Skilled Nursing Facility | Payer: Medicare Other | Attending: Internal Medicine | Admitting: Internal Medicine

## 2017-01-17 ENCOUNTER — Emergency Department (HOSPITAL_COMMUNITY): Payer: Medicare Other

## 2017-01-17 ENCOUNTER — Encounter: Payer: Self-pay | Admitting: Family Medicine

## 2017-01-17 ENCOUNTER — Encounter (HOSPITAL_COMMUNITY): Payer: Self-pay | Admitting: *Deleted

## 2017-01-17 ENCOUNTER — Ambulatory Visit (INDEPENDENT_AMBULATORY_CARE_PROVIDER_SITE_OTHER): Payer: Medicare Other | Admitting: Family Medicine

## 2017-01-17 VITALS — BP 124/80 | HR 88 | Temp 98.6°F | Resp 24 | Ht 65.0 in | Wt 152.2 lb

## 2017-01-17 DIAGNOSIS — I10 Essential (primary) hypertension: Secondary | ICD-10-CM | POA: Diagnosis not present

## 2017-01-17 DIAGNOSIS — I69320 Aphasia following cerebral infarction: Secondary | ICD-10-CM | POA: Diagnosis not present

## 2017-01-17 DIAGNOSIS — Z90710 Acquired absence of both cervix and uterus: Secondary | ICD-10-CM

## 2017-01-17 DIAGNOSIS — F0390 Unspecified dementia without behavioral disturbance: Secondary | ICD-10-CM | POA: Diagnosis present

## 2017-01-17 DIAGNOSIS — Y95 Nosocomial condition: Secondary | ICD-10-CM | POA: Diagnosis present

## 2017-01-17 DIAGNOSIS — J189 Pneumonia, unspecified organism: Secondary | ICD-10-CM | POA: Diagnosis not present

## 2017-01-17 DIAGNOSIS — J181 Lobar pneumonia, unspecified organism: Secondary | ICD-10-CM | POA: Diagnosis not present

## 2017-01-17 DIAGNOSIS — E785 Hyperlipidemia, unspecified: Secondary | ICD-10-CM | POA: Diagnosis present

## 2017-01-17 DIAGNOSIS — J690 Pneumonitis due to inhalation of food and vomit: Secondary | ICD-10-CM | POA: Diagnosis not present

## 2017-01-17 DIAGNOSIS — I2510 Atherosclerotic heart disease of native coronary artery without angina pectoris: Secondary | ICD-10-CM | POA: Diagnosis present

## 2017-01-17 DIAGNOSIS — J9601 Acute respiratory failure with hypoxia: Secondary | ICD-10-CM | POA: Diagnosis present

## 2017-01-17 DIAGNOSIS — E876 Hypokalemia: Secondary | ICD-10-CM | POA: Diagnosis not present

## 2017-01-17 DIAGNOSIS — J069 Acute upper respiratory infection, unspecified: Secondary | ICD-10-CM

## 2017-01-17 DIAGNOSIS — R0602 Shortness of breath: Secondary | ICD-10-CM | POA: Diagnosis not present

## 2017-01-17 DIAGNOSIS — K5900 Constipation, unspecified: Secondary | ICD-10-CM | POA: Diagnosis present

## 2017-01-17 DIAGNOSIS — I5031 Acute diastolic (congestive) heart failure: Secondary | ICD-10-CM | POA: Diagnosis present

## 2017-01-17 DIAGNOSIS — Z9049 Acquired absence of other specified parts of digestive tract: Secondary | ICD-10-CM

## 2017-01-17 DIAGNOSIS — Z885 Allergy status to narcotic agent status: Secondary | ICD-10-CM

## 2017-01-17 DIAGNOSIS — F419 Anxiety disorder, unspecified: Secondary | ICD-10-CM | POA: Diagnosis present

## 2017-01-17 DIAGNOSIS — J9621 Acute and chronic respiratory failure with hypoxia: Secondary | ICD-10-CM | POA: Diagnosis present

## 2017-01-17 DIAGNOSIS — I639 Cerebral infarction, unspecified: Secondary | ICD-10-CM | POA: Diagnosis present

## 2017-01-17 DIAGNOSIS — E039 Hypothyroidism, unspecified: Secondary | ICD-10-CM | POA: Diagnosis not present

## 2017-01-17 DIAGNOSIS — I110 Hypertensive heart disease with heart failure: Secondary | ICD-10-CM | POA: Diagnosis present

## 2017-01-17 DIAGNOSIS — Z950 Presence of cardiac pacemaker: Secondary | ICD-10-CM | POA: Diagnosis not present

## 2017-01-17 DIAGNOSIS — J9600 Acute respiratory failure, unspecified whether with hypoxia or hypercapnia: Secondary | ICD-10-CM | POA: Diagnosis not present

## 2017-01-17 DIAGNOSIS — Z7902 Long term (current) use of antithrombotics/antiplatelets: Secondary | ICD-10-CM

## 2017-01-17 DIAGNOSIS — I509 Heart failure, unspecified: Secondary | ICD-10-CM | POA: Diagnosis not present

## 2017-01-17 DIAGNOSIS — E038 Other specified hypothyroidism: Secondary | ICD-10-CM | POA: Diagnosis not present

## 2017-01-17 DIAGNOSIS — I480 Paroxysmal atrial fibrillation: Secondary | ICD-10-CM | POA: Diagnosis present

## 2017-01-17 DIAGNOSIS — R05 Cough: Secondary | ICD-10-CM | POA: Diagnosis not present

## 2017-01-17 DIAGNOSIS — Z79899 Other long term (current) drug therapy: Secondary | ICD-10-CM

## 2017-01-17 DIAGNOSIS — Z96642 Presence of left artificial hip joint: Secondary | ICD-10-CM | POA: Diagnosis present

## 2017-01-17 DIAGNOSIS — R0682 Tachypnea, not elsewhere classified: Secondary | ICD-10-CM

## 2017-01-17 DIAGNOSIS — E784 Other hyperlipidemia: Secondary | ICD-10-CM | POA: Diagnosis not present

## 2017-01-17 LAB — I-STAT CHEM 8, ED
BUN: 17 mg/dL (ref 6–20)
CHLORIDE: 96 mmol/L — AB (ref 101–111)
CREATININE: 0.7 mg/dL (ref 0.44–1.00)
Calcium, Ion: 1.04 mmol/L — ABNORMAL LOW (ref 1.15–1.40)
Glucose, Bld: 145 mg/dL — ABNORMAL HIGH (ref 65–99)
HEMATOCRIT: 45 % (ref 36.0–46.0)
Hemoglobin: 15.3 g/dL — ABNORMAL HIGH (ref 12.0–15.0)
POTASSIUM: 3.2 mmol/L — AB (ref 3.5–5.1)
Sodium: 132 mmol/L — ABNORMAL LOW (ref 135–145)
TCO2: 25 mmol/L (ref 0–100)

## 2017-01-17 LAB — COMPREHENSIVE METABOLIC PANEL
ALBUMIN: 4.4 g/dL (ref 3.5–5.0)
ALK PHOS: 65 U/L (ref 38–126)
ALT: 29 U/L (ref 14–54)
ANION GAP: 13 (ref 5–15)
AST: 50 U/L — AB (ref 15–41)
BUN: 18 mg/dL (ref 6–20)
CALCIUM: 9.1 mg/dL (ref 8.9–10.3)
CO2: 23 mmol/L (ref 22–32)
Chloride: 96 mmol/L — ABNORMAL LOW (ref 101–111)
Creatinine, Ser: 0.81 mg/dL (ref 0.44–1.00)
GFR calc Af Amer: >60 mL/min (ref >60)
GFR calc non Af Amer: >60 mL/min (ref >60)
GLUCOSE: 150 mg/dL — AB (ref 65–99)
POTASSIUM: 3.2 mmol/L — AB (ref 3.5–5.1)
SODIUM: 132 mmol/L — AB (ref 135–145)
Total Bilirubin: 0.6 mg/dL (ref 0.3–1.2)
Total Protein: 7.9 g/dL (ref 6.5–8.1)

## 2017-01-17 LAB — CBC WITH DIFFERENTIAL/PLATELET
BASOS ABS: 0 10*3/uL (ref 0.0–0.1)
BASOS PCT: 0 %
Eosinophils Absolute: 0 10*3/uL (ref 0.0–0.7)
Eosinophils Relative: 0 %
HEMATOCRIT: 40.4 % (ref 36.0–46.0)
HEMOGLOBIN: 14.4 g/dL (ref 12.0–15.0)
LYMPHS PCT: 8 %
Lymphs Abs: 0.6 10*3/uL — ABNORMAL LOW (ref 0.7–4.0)
MCH: 31.2 pg (ref 26.0–34.0)
MCHC: 35.6 g/dL (ref 30.0–36.0)
MCV: 87.4 fL (ref 78.0–100.0)
MONO ABS: 0.7 10*3/uL (ref 0.1–1.0)
Monocytes Relative: 9 %
NEUTROS ABS: 6.8 10*3/uL (ref 1.7–7.7)
NEUTROS PCT: 83 %
Platelets: 222 10*3/uL (ref 150–400)
RBC: 4.62 MIL/uL (ref 3.87–5.11)
RDW: 12.8 % (ref 11.5–15.5)
WBC: 8.2 10*3/uL (ref 4.0–10.5)

## 2017-01-17 LAB — LACTIC ACID, PLASMA: Lactic Acid, Venous: 1.4 mmol/L (ref 0.5–1.9)

## 2017-01-17 LAB — I-STAT TROPONIN, ED: Troponin i, poc: 0.03 ng/mL (ref 0.00–0.08)

## 2017-01-17 LAB — TROPONIN I: Troponin I: 0.04 ng/mL (ref <0.03)

## 2017-01-17 LAB — BRAIN NATRIURETIC PEPTIDE: B Natriuretic Peptide: 700.7 pg/mL — ABNORMAL HIGH (ref 0.0–100.0)

## 2017-01-17 MED ORDER — IPRATROPIUM-ALBUTEROL 0.5-2.5 (3) MG/3ML IN SOLN
3.0000 mL | Freq: Once | RESPIRATORY_TRACT | Status: AC
  Administered 2017-01-17: 3 mL via RESPIRATORY_TRACT
  Filled 2017-01-17: qty 3

## 2017-01-17 MED ORDER — IPRATROPIUM-ALBUTEROL 0.5-2.5 (3) MG/3ML IN SOLN
3.0000 mL | Freq: Once | RESPIRATORY_TRACT | Status: AC
  Administered 2017-01-17: 3 mL via RESPIRATORY_TRACT

## 2017-01-17 MED ORDER — CLOPIDOGREL BISULFATE 75 MG PO TABS
75.0000 mg | ORAL_TABLET | Freq: Every day | ORAL | Status: DC
  Administered 2017-01-18 – 2017-01-21 (×4): 75 mg via ORAL
  Filled 2017-01-17 (×4): qty 1

## 2017-01-17 MED ORDER — ACETAMINOPHEN 500 MG PO TABS
1000.0000 mg | ORAL_TABLET | Freq: Once | ORAL | Status: AC
  Administered 2017-01-17: 1000 mg via ORAL
  Filled 2017-01-17: qty 2

## 2017-01-17 MED ORDER — DEXTROSE 5 % IV SOLN: 1.0000 g | INTRAVENOUS | Status: DC

## 2017-01-17 MED ORDER — CEFTRIAXONE SODIUM 1 G IJ SOLR
1.0000 g | Freq: Once | INTRAMUSCULAR | Status: AC
  Administered 2017-01-17: 1 g via INTRAVENOUS
  Filled 2017-01-17: qty 10

## 2017-01-17 MED ORDER — DONEPEZIL HCL 5 MG PO TABS
10.0000 mg | ORAL_TABLET | Freq: Every day | ORAL | Status: DC
  Administered 2017-01-18 – 2017-01-21 (×4): 10 mg via ORAL
  Filled 2017-01-17 (×5): qty 2

## 2017-01-17 MED ORDER — ADULT MULTIVITAMIN W/MINERALS CH
1.0000 | ORAL_TABLET | Freq: Every morning | ORAL | Status: DC
  Administered 2017-01-18 – 2017-01-21 (×4): 1 via ORAL
  Filled 2017-01-17 (×4): qty 1

## 2017-01-17 MED ORDER — POLYETHYLENE GLYCOL 3350 17 G PO PACK
17.0000 g | PACK | Freq: Every day | ORAL | Status: DC | PRN
  Administered 2017-01-18: 17 g via ORAL
  Filled 2017-01-17: qty 1

## 2017-01-17 MED ORDER — IPRATROPIUM BROMIDE 0.02 % IN SOLN
0.5000 mg | Freq: Four times a day (QID) | RESPIRATORY_TRACT | Status: DC
  Administered 2017-01-18: 0.5 mg via RESPIRATORY_TRACT
  Filled 2017-01-17: qty 2.5

## 2017-01-17 MED ORDER — PRAVASTATIN SODIUM 40 MG PO TABS
40.0000 mg | ORAL_TABLET | Freq: Every day | ORAL | Status: DC
  Administered 2017-01-18 – 2017-01-21 (×5): 40 mg via ORAL
  Filled 2017-01-17 (×5): qty 1

## 2017-01-17 MED ORDER — IPRATROPIUM-ALBUTEROL 0.5-2.5 (3) MG/3ML IN SOLN: 3.0000 mL | Freq: Four times a day (QID) | RESPIRATORY_TRACT | Status: DC

## 2017-01-17 MED ORDER — LEVOTHYROXINE SODIUM 25 MCG PO TABS
125.0000 ug | ORAL_TABLET | Freq: Every day | ORAL | Status: DC
  Administered 2017-01-18 – 2017-01-21 (×4): 125 ug via ORAL
  Filled 2017-01-17 (×5): qty 1

## 2017-01-17 MED ORDER — CALCIUM CARBONATE-VITAMIN D 600-400 MG-UNIT PO TABS: 1.0000 | ORAL_TABLET | Freq: Every day | ORAL | Status: DC

## 2017-01-17 MED ORDER — DM-GUAIFENESIN ER 30-600 MG PO TB12: 1.0000 | ORAL_TABLET | Freq: Two times a day (BID) | ORAL | Status: DC | PRN

## 2017-01-17 MED ORDER — ENOXAPARIN SODIUM 40 MG/0.4ML ~~LOC~~ SOLN
40.0000 mg | SUBCUTANEOUS | Status: DC
  Administered 2017-01-17 – 2017-01-20 (×4): 40 mg via SUBCUTANEOUS
  Filled 2017-01-17 (×4): qty 0.4

## 2017-01-17 MED ORDER — ALPRAZOLAM 0.25 MG PO TABS
0.2500 mg | ORAL_TABLET | Freq: Two times a day (BID) | ORAL | Status: DC | PRN
  Administered 2017-01-19: 0.25 mg via ORAL
  Filled 2017-01-17: qty 1

## 2017-01-17 MED ORDER — DEXTROSE 5 % IV SOLN
500.0000 mg | Freq: Once | INTRAVENOUS | Status: AC
  Administered 2017-01-17: 500 mg via INTRAVENOUS
  Filled 2017-01-17: qty 500

## 2017-01-17 MED ORDER — POTASSIUM CHLORIDE 20 MEQ/15ML (10%) PO SOLN
40.0000 meq | Freq: Once | ORAL | Status: AC
  Administered 2017-01-17: 40 meq via ORAL
  Filled 2017-01-17: qty 30

## 2017-01-17 MED ORDER — LOSARTAN POTASSIUM 50 MG PO TABS
100.0000 mg | ORAL_TABLET | Freq: Every day | ORAL | Status: DC
  Administered 2017-01-18 – 2017-01-21 (×4): 100 mg via ORAL
  Filled 2017-01-17 (×4): qty 2

## 2017-01-17 MED ORDER — DEXTROSE 5 % IV SOLN
500.0000 mg | INTRAVENOUS | Status: DC
  Administered 2017-01-18 – 2017-01-19 (×2): 500 mg via INTRAVENOUS
  Filled 2017-01-17 (×2): qty 500

## 2017-01-17 MED ORDER — AMLODIPINE BESYLATE 10 MG PO TABS
10.0000 mg | ORAL_TABLET | Freq: Every day | ORAL | Status: DC
  Administered 2017-01-18 – 2017-01-21 (×4): 10 mg via ORAL
  Filled 2017-01-17 (×4): qty 1

## 2017-01-17 MED ORDER — POLYVINYL ALCOHOL 1.4 % OP SOLN
1.0000 [drp] | Freq: Four times a day (QID) | OPHTHALMIC | Status: DC
  Administered 2017-01-17 – 2017-01-21 (×15): 1 [drp] via OPHTHALMIC
  Filled 2017-01-17: qty 15

## 2017-01-17 MED ORDER — HYDROCOD POLST-CPM POLST ER 10-8 MG/5ML PO SUER
2.5000 mL | Freq: Once | ORAL | Status: AC
  Administered 2017-01-17: 2.5 mL via ORAL
  Filled 2017-01-17: qty 5

## 2017-01-17 MED ORDER — METRONIDAZOLE IN NACL 5-0.79 MG/ML-% IV SOLN
500.0000 mg | Freq: Three times a day (TID) | INTRAVENOUS | Status: DC
  Administered 2017-01-17 – 2017-01-18 (×2): 500 mg via INTRAVENOUS
  Filled 2017-01-17 (×2): qty 100

## 2017-01-17 MED ORDER — DOCUSATE SODIUM 100 MG PO CAPS
100.0000 mg | ORAL_CAPSULE | Freq: Every morning | ORAL | Status: DC
  Administered 2017-01-18 – 2017-01-21 (×4): 100 mg via ORAL
  Filled 2017-01-17 (×4): qty 1

## 2017-01-17 MED ORDER — LEVOTHYROXINE SODIUM 125 MCG PO TABS: 125.0000 ug | ORAL_TABLET | Freq: Every day | ORAL | Status: DC

## 2017-01-17 MED ORDER — ALBUTEROL SULFATE (2.5 MG/3ML) 0.083% IN NEBU: 2.5000 mg | INHALATION_SOLUTION | RESPIRATORY_TRACT | Status: DC | PRN

## 2017-01-17 MED ORDER — FUROSEMIDE 10 MG/ML IJ SOLN
20.0000 mg | Freq: Once | INTRAMUSCULAR | Status: AC
  Administered 2017-01-17: 20 mg via INTRAVENOUS
  Filled 2017-01-17: qty 2

## 2017-01-17 MED ORDER — CALCIUM CARBONATE-VITAMIN D 500-200 MG-UNIT PO TABS
1.0000 | ORAL_TABLET | Freq: Every day | ORAL | Status: DC
  Administered 2017-01-17 – 2017-01-20 (×4): 1 via ORAL
  Filled 2017-01-17 (×4): qty 1

## 2017-01-17 NOTE — ED Triage Notes (Signed)
Per EMS, pt is coming from [facility]. Pt has been complaining of SOB and congestion for approx. 2-3 days. EMS states pt O2 saturation was at 84% upon arrival. EMS put pt on 4L and O2 came up to 93%. Staff at facility administered 5 mg abuterol via neb tx. Pt has a hx of CHF, CVA, and dementia.

## 2017-01-17 NOTE — ED Notes (Signed)
Bed: WA01 Expected date:  Expected time:  Means of arrival:  Comments: EMS-PNA 

## 2017-01-17 NOTE — Progress Notes (Signed)
CRITICAL VALUE ALERT  Critical value received:  Trop 0.04  Date of notification:  01/17/17  Time of notification:  2230  Critical value read back:Yes.    Nurse who received alert:  Virgina Norfolk, RN  MD notified (1st page):  Baltazar Najjar  Time of first page:  2305  MD notified (2nd page):  Time of second page:  Responding MD:   Time MD responded:

## 2017-01-17 NOTE — H&P (Addendum)
History and Physical    [name] JME:[id number] DOB: [birth year] DOA: 01/17/2017  Referring MD/NP/PA:   PCP: Cathlean Cower, MD   Patient coming from:  The patient is coming from home.  At baseline, pt is partially dependent for most of ADL.  Chief Complaint: Shortness of breath, cough, chest congestion  HPI: [name] is a 83 y.o. female with medical history significant of hypertension, hyperlipidemia, hypothyroidism, anxiety, TIA, stroke with mild right leg weakness and aphasia, complete AV block, s/p of pacemaker placement, dementia, a fib not on anticoagulants, who presents with shortness breath, cough and chest congestion.  Per patient's daughter-in-law, patient has been having cough, SOB and chest congestion in the past 2 or 3 days, which has been progressively getting worse. She has cough with little yellow to clear mucus production. She can speak in full sentence. Patient does not have fever, chills, runny nose, sore throat. Per her daughter-in-law, patient does not have chest pain, but coughing induced some chest pain and abdominal pain. Currently no nausea, vomiting, diarrhea or abdominal pain. Denies tenderness in the calf area Her daughter-in-law noticed mild lateral leg of fluid retention in the past for several days.   ED Course: pt was found to have BNP 700.7, negative troponin, potassium 3.2, creatinine normal, temperature 99.3, no tachycardia, tachypnea, oxygen saturation 84% on room air, which improved to 93% on 4 L nasal cannula oxygen, chest x-ray showed patchy opacity left lower lobe. Pt is accepted to tele bed as inpt  Review of Systems:   General: no fevers, chills, no changes in body weight, has fatigue HEENT: no blurry vision, hearing changes or sore throat Respiratory: has dyspnea, coughing, no wheezing CV: no chest pain, no palpitations GI: no nausea, vomiting, abdominal pain, diarrhea, constipation GU: no dysuria, burning on urination, increased  urinary frequency, hematuria  Ext: trace leg edema Neuro: has chronic mild R leg weakness and aphasia, No vision change or hearing loss Skin: no rash, no skin tear. MSK: No muscle spasm, no deformity, no limitation of range of movement in spin Heme: No easy bruising.  Travel history: No recent long distant travel.  Allergy:  Allergies  Allergen Reactions  . Codeine     unknown    Past Medical History:  Diagnosis Date  . Aphasia 01/09/2009  . Arthritis    right hand  . 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 CEREBROVASCULAR ACCIDENT, HX OF 11/20/2007  . CHF (congestive heart failure) ([place])   . Coronary artery disease   . CVA 01/09/2009  . DEMENTIA 05/19/2008  . Dementia   . DIVERTICULOSIS, COLON 11/20/2007  . FATIGUE 11/20/2007  . Femoral fracture ([place]) 07/01/2013  . GLUCOSE INTOLERANCE 12/06/2010  . HYPERLIPIDEMIA 11/20/2007  . HYPERTENSION 11/20/2007   Echo was done 07/07/11: mod LVH, EF 55-60%, grade 1 diast dysfxn, mild MR, mild LAE, mod TR, PASP 39  . HYPOTHYROIDISM 11/20/2007  . Impaired glucose tolerance 06/03/2011  . IRON DEFICIENCY 12/06/2010  . Pacemaker 2002  . PACEMAKER, PERMANENT 01/09/2009  . TIA 05/14/2003   elevated CE's in setting of TIA and falls in 10/12 - echo normal with no further cardiac w/u pursued  . TIA (transient ischemic attack) 10/10/2011  . Urinary incontinence 01/09/2009    Past Surgical History:  Procedure Laterality Date  . ABDOMINAL HYSTERECTOMY    . CHOLECYSTECTOMY    . CYSTOCELE REPAIR N/A 11/26/2012   Procedure: Cystocele Repair with Lefort Colpocleisis.  colpectomy Levatorplasty Perineorraphy;  Surgeon: Lovenia Kim, MD;  Location:  [facility];  Service: Gynecology;  Laterality: N/A;  Cystocele Repair with Lefort Colpocleisis.  Marland Kitchen HIP ARTHROPLASTY Left 07/02/2013   Procedure: ARTHROPLASTY BIPOLAR HIP;  Surgeon: Mauri Pole, MD;  Location: [facility];  Service: Orthopedics;  Laterality: Left;  . INGUINAL HERNIA REPAIR Right 08/13/2013   Procedure: HERNIA  REPAIR INGUINAL INCARCERATED;  Surgeon: Ralene Ok, MD;  Location: [facility];  Service: General;  Laterality: Right;  . Medtronic Cynthia dual chamber pacemaker serial number was [id number] H...04/15/2009    . s/p cerebral aneurysm  1991  . s/p pacemaker DDD    . s/p retinal attachment    . s/p thyroidectomy      Social History:  reports that she has never smoked. She has never used smokeless tobacco. She reports that she does not drink alcohol or use drugs.  Family History:  Family History  Problem Relation Age of Onset  . Cancer Mother     breast cancer     Prior to Admission medications   Medication Sig Start Date End Date Taking? Authorizing Provider  acetaminophen (TYLENOL) 325 MG tablet Take 650 mg by mouth every 6 (six) hours as needed (ARTHRITIS PAIN IN HANDS).   Yes Historical Provider, MD  ALPRAZolam Duanne Moron) 0.25 MG tablet TAKE 1/2 -1 TABLET BY MOUTH 2 TIMES DAILY AS NEEDED 08/07/14  Yes Biagio Borg, MD  amLODipine (NORVASC) 10 MG tablet Take 1 tablet (10 mg total) by mouth daily. Resume in 3 days if SBP > 150 04/26/16  Yes Biagio Borg, MD  Calcium Carbonate-Vitamin D (CALTRATE 600+D) 600-400 MG-UNIT per tablet Take 1 tablet by mouth at bedtime.    Yes Historical Provider, MD  clopidogrel (PLAVIX) 75 MG tablet Take 1 tablet (75 mg total) by mouth daily. 04/26/16  Yes Biagio Borg, MD  Docusate Sodium (DSS) 100 MG CAPS Take 100 mg by mouth every morning.  07/03/13  Yes Matthew Babish, PA-C  donepezil (ARICEPT) 10 MG tablet Take 1 tablet (10 mg total) by mouth daily. 04/26/16  Yes Biagio Borg, MD  levothyroxine (SYNTHROID, LEVOTHROID) 125 MCG tablet Take 1 tablet (125 mcg total) by mouth daily. 04/26/16  Yes Biagio Borg, MD  losartan (COZAAR) 100 MG tablet Take 1 tablet (100 mg total) by mouth daily. 04/26/16  Yes Biagio Borg, MD  Multiple Vitamin (MULTIVITAMIN WITH MINERALS) TABS Take 1 tablet by mouth every morning. Centrum silver   Yes Historical Provider, MD  polyethylene glycol  (MIRALAX / GLYCOLAX) packet Take 17 g by mouth daily as needed for mild constipation.   Yes Historical Provider, MD  polyvinyl alcohol (ARTIFICIAL TEARS) 1.4 % ophthalmic solution Place 1 drop into both eyes 4 (four) times daily.    Yes Historical Provider, MD  pravastatin (PRAVACHOL) 40 MG tablet Take 1 tablet (40 mg total) by mouth daily. 04/26/16  Yes Biagio Borg, MD  feeding supplement, ENSURE COMPLETE, (ENSURE COMPLETE) LIQD Take 237 mLs by mouth 2 (two) times daily between meals. 08/23/13   Erby Pian, NP    Physical Exam: Vitals:   01/17/17 1830 01/17/17 1854 01/17/17 1900 01/17/17 1930  BP: 133/76  (!) 120/54 134/61  Pulse: 68  (!) 50 63  Resp: (!) 31  (!) 28 (!) 28  Temp:      TempSrc:      SpO2: (!) 89% 91% 91% 91%   General: Not in acute distress HEENT:       Eyes: PERRL, EOMI, no scleral icterus.  ENT: No discharge from the ears and nose, no pharynx injection, no tonsillar enlargement.        Neck: +/-JVD, no bruit, no mass felt. Heme: No neck lymph node enlargement. Cardiac: S1/S2, RRR, No murmurs, No gallops or rubs. Respiratory: has rhonchi bilaterally.  GI: Soft, nondistended, nontender, no rebound pain, no organomegaly, BS present. GU: No hematuria Ext: has trace pitting leg edema bilaterally. 2+DP/PT pulse bilaterally. Musculoskeletal: No joint deformities, No joint redness or warmth, no limitation of ROM in spin. Skin: No rashes.  Neuro: Alert, oriented X3, cranial nerves II-XII grossly intact, moves all extremities. Psych: Patient is not psychotic, no suicidal or hemocidal ideation.  Labs on Admission: I have personally reviewed following labs and imaging studies  CBC:  Recent Labs Lab 01/17/17 1643 01/17/17 1758  WBC 8.2  --   NEUTROABS 6.8  --   HGB 14.4 15.3*  HCT 40.4 45.0  MCV 87.4  --   PLT 222  --    Basic Metabolic Panel:  Recent Labs Lab 01/17/17 1643 01/17/17 1758  NA 132* 132*  K 3.2* 3.2*  CL 96* 96*  CO2 23  --   GLUCOSE  150* 145*  BUN 18 17  CREATININE 0.81 0.70  CALCIUM 9.1  --    GFR: Estimated Creatinine Clearance: 43.8 mL/min (by C-G formula based on SCr of 0.7 mg/dL). Liver Function Tests:  Recent Labs Lab 01/17/17 1643  AST 50*  ALT 29  ALKPHOS 65  BILITOT 0.6  PROT 7.9  ALBUMIN 4.4   No results for input(s): LIPASE, AMYLASE in the last 168 hours. No results for input(s): AMMONIA in the last 168 hours. Coagulation Profile: No results for input(s): INR, PROTIME in the last 168 hours. Cardiac Enzymes: No results for input(s): CKTOTAL, CKMB, CKMBINDEX, TROPONINI in the last 168 hours. BNP (last 3 results) No results for input(s): PROBNP in the last 8760 hours. HbA1C: No results for input(s): HGBA1C in the last 72 hours. CBG: No results for input(s): GLUCAP in the last 168 hours. Lipid Profile: No results for input(s): CHOL, HDL, LDLCALC, TRIG, CHOLHDL, LDLDIRECT in the last 72 hours. Thyroid Function Tests: No results for input(s): TSH, T4TOTAL, FREET4, T3FREE, THYROIDAB in the last 72 hours. Anemia Panel: No results for input(s): VITAMINB12, FOLATE, FERRITIN, TIBC, IRON, RETICCTPCT in the last 72 hours. Urine analysis:    Component Value Date/Time   COLORURINE YELLOW 10/27/2016 1224   APPEARANCEUR CLEAR 10/27/2016 1224   LABSPEC 1.010 10/27/2016 1224   PHURINE 6.0 10/27/2016 1224   GLUCOSEU NEGATIVE 10/27/2016 1224   HGBUR NEGATIVE 10/27/2016 1224   BILIRUBINUR NEGATIVE 10/27/2016 1224   KETONESUR NEGATIVE 10/27/2016 1224   PROTEINUR NEGATIVE 01/11/2015 1000   UROBILINOGEN 0.2 10/27/2016 1224   NITRITE NEGATIVE 10/27/2016 1224   LEUKOCYTESUR NEGATIVE 10/27/2016 1224   Sepsis Labs: @LABRCNTIP (procalcitonin:4,lacticidven:4) )No results found for this or any previous visit (from the past 240 hour(s)).   Radiological Exams on Admission: Dg Chest 2 View  Result Date: 01/17/2017 CLINICAL DATA:  Cough.  Fever. EXAM: CHEST  2 VIEW COMPARISON:  10/10/2013 chest radiograph.  FINDINGS: Stable configuration of 2 lead left subclavian pacemaker. Stable cardiomediastinal silhouette with aortic atherosclerosis and top-normal heart size. No pneumothorax. No pleural effusion. Patchy left lower lobe opacities. No overt pulmonary edema. IMPRESSION: Patchy left lower lobe opacities, which may represent aspiration or pneumonia. Recommend follow-up PA and lateral post treatment chest radiographs in 4-6 weeks. Aortic atherosclerosis. Electronically Signed   By: Janina Mayo.D.  On: 01/17/2017 17:26     EKG: Independently reviewed. QTC 539, pacer rhythm   Assessment/Plan Principal Problem:   Acute on chronic respiratory failure with hypoxia (HCC) Active Problems:   Hypothyroidism   Hyperlipidemia   Dementia   Essential hypertension   CVA (cerebral vascular accident) ([place])   PACEMAKER, PERMANENT   Hypokalemia   Aspiration pneumonia (HCC)   Acute respiratory failure with hypoxia (HCC)   Acute on chronic respiratory failure with hypoxia [facility]): Etiology is not clear.  Chest x-ray showed patchy infiltration in the left lower lobe, indicating possible CAP vs. Aspiration PNA. Pt has trace leg edema and +/- JVD, and elevated BNP 700.7, indicating possible acute CHF. Patient does not have history of CHF, but has hx of hypertension, possibly due to acute diastolic CHF. No 2-D echo on record. Will treat pt presumably for combination of pneumonia and acute dCHF and get 2d echo for further evaluation.  -will admit to tele bed as inpt. - Rocephin and azithromycin were started in ED, will continue.  - will add flagyl to cover possible aspiration pneumonia - SLP - Mucinex for cough  - Atrovent nebs and prn Albuterol Nebs for SOB - Urine legionella and S. pneumococcal antigen - Follow up blood culture x2, sputum culture and plus Flu pcr - will give one dose of Lasix 20 mg by IV, and follow up 2d ech - Reassess volume status in AM. - trop x 3 - 2d echo - Risk factor  stratification: A1c, FLP - Daily weights  Addendum: lactic acid is trending up from 1.4-->2.7 -will start gentle IVF: NS 75 cc/h   Hypothyroidism: Last TSH was 2.82 on 10/27/16 -Continue home Synthroid  HLD: -Pravastatin  Dementia: -Donepezil  HTN: -Amlodipine, losartan  Hypokalemia: K=3.2on admission. - Repleted - Check Mg level  CVA (cerebral vascular accident) ([place]) -continue plavix and pravastatin  Atrial Fibrillation: CHA2DS2-VASc Score is 6 (7 if plus possible CHF), needs oral anticoagulation, but patient is not on AC at home, Possibly due to dementia and high risk for fall. Heart rate is well controlled without using CCB or BB. -s/p pacemaker due to complete AVB -tele monitoring.   DVT ppx: SQ Lovenox Code Status: Full code (spoke with her SON, the POA on the phone, who said pt would want to be Full Code) Family Communication: Yes, patient's Daughter-in-law   at bed side Disposition Plan:  Anticipate discharge back to previous home environment Consults called:  none Admission status: Inpatient/tele     Date of Service 01/17/2017    Ivor Costa [facility] Pager [phone]  If 7PM-7AM, please contact night-coverage [email or web address] Password [email or web address] 01/17/2017, 8:16 PM

## 2017-01-17 NOTE — Progress Notes (Signed)
HPI:   ACUTE VISIT:  Chief Complaint  Patient presents with  . congestion/trouble breathing    [name] is a 83 y.o. female, who is here today with her daughter in law, who provides most of the history.  According to daughter in law yesterday she started with mild cough and epiphora, she thought it was allergies. Today she has rapidly declined, daughter in law noted some worsening confusion in the morning when she was talking with her on the phone.She has Hx of dementia and has had mild episodes of confusion intermittently but today it seems worse. She has had generalized weakness/fatigue, difficulty walking with her walker, decreased appetite, dyspnea at rest, worsening wet cough. No fever or sore throat. + Rhinorrhea and nasal congestion.  [name] is upset because has not been able to pull up the zipper of her pants, feel tight around her waist.  Last bowel movement yesterday, she has had abdominal surgeries but has not c/o  abdominal pain,nausea,or vomiting. She has Hx of constipation and takes daily Miralax,Metamucil,and Colace.  No urinary symptoms.  Some residents at the retired community where she lives have been sick.  She has not taken OTC medication for this problem.  Hx of complete heart block and atrial fib, s/p pacemaker placement. No known Hx of CHF. She denies orthopnea, sleeping on 1-2 pillows as she usually does.   Review of Systems  Constitutional: Positive for activity change, appetite change and fatigue. Negative for fever.  HENT: Positive for congestion, postnasal drip and rhinorrhea. Negative for ear pain, facial swelling, mouth sores, sore throat and trouble swallowing.   Eyes: Positive for discharge and redness. Negative for visual disturbance.  Respiratory: Positive for cough, shortness of breath and wheezing.   Cardiovascular: Negative for chest pain and leg swelling.  Gastrointestinal: Positive for abdominal distention and  constipation. Negative for abdominal pain, nausea and vomiting.  Genitourinary: Negative for decreased urine volume, dysuria and hematuria.  Musculoskeletal: Positive for gait problem.  Skin: Negative for pallor and rash.  Allergic/Immunologic: Positive for environmental allergies.  Neurological: Negative for seizures, syncope and headaches.  Hematological: Negative for adenopathy. Does not bruise/bleed easily.  Psychiatric/Behavioral: Positive for confusion. Negative for hallucinations.      No current facility-administered medications on file prior to visit.    Current Outpatient Prescriptions on File Prior to Visit  Medication Sig Dispense Refill  . ALPRAZolam (XANAX) 0.25 MG tablet TAKE 1/2 -1 TABLET BY MOUTH 2 TIMES DAILY AS NEEDED 40 tablet 1  . amLODipine (NORVASC) 10 MG tablet Take 1 tablet (10 mg total) by mouth daily. Resume in 3 days if SBP > 150 90 tablet 3  . Calcium Carbonate-Vitamin D (CALTRATE 600+D) 600-400 MG-UNIT per tablet Take 1 tablet by mouth at bedtime.     . clopidogrel (PLAVIX) 75 MG tablet Take 1 tablet (75 mg total) by mouth daily. 90 tablet 3  . Docusate Sodium (DSS) 100 MG CAPS Take 100 mg by mouth every morning.     . donepezil (ARICEPT) 10 MG tablet Take 1 tablet (10 mg total) by mouth daily. 90 tablet 3  . feeding supplement, ENSURE COMPLETE, (ENSURE COMPLETE) LIQD Take 237 mLs by mouth 2 (two) times daily between meals.    Marland Kitchen levothyroxine (SYNTHROID, LEVOTHROID) 125 MCG tablet Take 1 tablet (125 mcg total) by mouth daily. 90 tablet 3  . losartan (COZAAR) 100 MG tablet Take 1 tablet (100 mg total) by mouth daily. 90 tablet 3  . Multiple  Vitamin (MULTIVITAMIN WITH MINERALS) TABS Take 1 tablet by mouth every morning. Centrum silver    . polyvinyl alcohol (ARTIFICIAL TEARS) 1.4 % ophthalmic solution Place 1 drop into both eyes 4 (four) times daily.     . pravastatin (PRAVACHOL) 40 MG tablet Take 1 tablet (40 mg total) by mouth daily. 90 tablet 3     Past  Medical History:  Diagnosis Date  . Aphasia 01/09/2009  . Arthritis    right hand  . 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 CEREBROVASCULAR ACCIDENT, HX OF 11/20/2007  . CHF (congestive heart failure) ([place])   . Coronary artery disease   . CVA 01/09/2009  . DEMENTIA 05/19/2008  . Dementia   . DIVERTICULOSIS, COLON 11/20/2007  . FATIGUE 11/20/2007  . Femoral fracture ([place]) 07/01/2013  . GLUCOSE INTOLERANCE 12/06/2010  . HYPERLIPIDEMIA 11/20/2007  . HYPERTENSION 11/20/2007   Echo was done 07/07/11: mod LVH, EF 55-60%, grade 1 diast dysfxn, mild MR, mild LAE, mod TR, PASP 39  . HYPOTHYROIDISM 11/20/2007  . Impaired glucose tolerance 06/03/2011  . IRON DEFICIENCY 12/06/2010  . Pacemaker 2002  . PACEMAKER, PERMANENT 01/09/2009  . TIA 05/14/2003   elevated CE's in setting of TIA and falls in 10/12 - echo normal with no further cardiac w/u pursued  . TIA (transient ischemic attack) 10/10/2011  . Urinary incontinence 01/09/2009   Allergies  Allergen Reactions  . Codeine     unknown    Social History   Social History  . Marital status: Divorced    Spouse name: N/A  . Number of children: N/A  . Years of education: N/A   Occupational History  . retired Radiation protection practitioner, lives at [place]  . Smoking status: Never Smoker  . Smokeless tobacco: Never Used  . Alcohol use No  . Drug use: No  . Sexual activity: Not Asked   Other Topics Concern  . None   Social History Narrative  . None    Vitals:   01/17/17 1427  BP: 124/80  Pulse: 88  Resp: (!) 24  Temp: 98.6 F (37 C)  O2 sat at RA 84% with supplemental O2 2 LPM by nasal cannula 90-92%  Body mass index is 25.34 kg/m.   Physical Exam  Nursing note and vitals reviewed. Constitutional: She appears well-developed. She appears ill. She appears distressed.  HENT:  Head: Atraumatic.  Nose: Nose lacerations present.  Mouth/Throat: Oropharynx is clear and moist and mucous membranes are normal.  Post nasal  drainage, nasal voice.  Eyes:  Epiphora and mild conjunctival injection bilateral.  Cardiovascular: Normal rate.  An irregular rhythm present.  Pulses:      Dorsalis pedis pulses are 2+ on the right side, and 2+ on the left side.  Distant heart sounds.   Respiratory: Tachypnea noted. She is in respiratory distress. She has decreased breath sounds (Left lung.). She has wheezes (diffuse,bilateral). She has rales (R>L).  GI: Soft. There is no tenderness.  Musculoskeletal: She exhibits edema (Trace pitting edema, LE bilateral).  Lymphadenopathy:    She has no cervical adenopathy.  Neurological: She is alert.  No focal weakness appreciated.   Skin: Skin is warm. No erythema.  Psychiatric: Her mood appears anxious.  Fairly groomed.    ASSESSMENT AND PLAN:   Laronica was seen today for congestion/trouble breathing.  Diagnoses and all orders for this visit:  Acute respiratory disease  -     ipratropium-albuterol (DUONEB) 0.5-2.5 (3) MG/3ML nebulizer solution 3 mL; Take  3 mLs by nebulization once.  Pneumonia, unspecified organism   We discussed possible etiologies, pneumonia is the most likely Dx. She has Hx of pneumonia,including aspiration pneumonia. No Hx of CHF but also to be considered. Rapid declining and respiratory distress, recommend ER evaluation, she will need stat lab work and imaging and most likely hospital admission. Here in the office Duoneb neb treatment was well tolerated, improved ventilation, rales still present bilateral and a mild wheezing. O2 sat after neb treatment and with supplemental O2 2 LPM 90-93%.  Sent to ER via EMS    No Follow-up on file.     Betty G. Martinique, MD  [facility]. [place] office.

## 2017-01-17 NOTE — Progress Notes (Signed)
Pre visit review using our clinic review tool, if applicable. No additional management support is needed unless otherwise documented below in the visit note. 

## 2017-01-17 NOTE — ED Provider Notes (Signed)
[facility] DEPT Provider Note   CSN: [id number] Arrival date & time: 01/17/17  1614     History   Chief Complaint Chief Complaint  Patient presents with  . Shortness of Breath    HPI [name] is a 83 y.o. female.  83 yo F with a chief complaint of cough, congestion, fever, sob. Going on since yesterday. Patient went to her family physician today because of worsening symptoms found to be hypoxic. Started on oxygen given a DuoNeb with minimal improvement. Then transferred here. Patient having some trace swelling. Chest pain with coughing.   The history is provided by the patient.  Shortness of Breath  This is a new problem. The average episode lasts 2 days. The problem occurs continuously.The current episode started yesterday. The problem has been gradually worsening. Associated symptoms include a fever and cough. Pertinent negatives include no headaches, no rhinorrhea, no wheezing, no chest pain and no vomiting. She has tried nothing for the symptoms. The treatment provided no relief.    Past Medical History:  Diagnosis Date  . Aphasia 01/09/2009  . Arthritis    right hand  . 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 CEREBROVASCULAR ACCIDENT, HX OF 11/20/2007  . CHF (congestive heart failure) ([place])   . Coronary artery disease   . CVA 01/09/2009  . DEMENTIA 05/19/2008  . Dementia   . DIVERTICULOSIS, COLON 11/20/2007  . FATIGUE 11/20/2007  . Femoral fracture ([place]) 07/01/2013  . GLUCOSE INTOLERANCE 12/06/2010  . HYPERLIPIDEMIA 11/20/2007  . HYPERTENSION 11/20/2007   Echo was done 07/07/11: mod LVH, EF 55-60%, grade 1 diast dysfxn, mild MR, mild LAE, mod TR, PASP 39  . HYPOTHYROIDISM 11/20/2007  . Impaired glucose tolerance 06/03/2011  . IRON DEFICIENCY 12/06/2010  . Pacemaker 2002  . PACEMAKER, PERMANENT 01/09/2009  . TIA 05/14/2003   elevated CE's in setting of TIA and falls in 10/12 - echo normal with no further cardiac w/u pursued  . TIA (transient ischemic attack) 10/10/2011    . Urinary incontinence 01/09/2009    Patient Active Problem List   Diagnosis Date Noted  . Acute on chronic respiratory failure with hypoxia ([place]) 01/17/2017  . Acute respiratory failure with hypoxia ([place]) 01/17/2017  . DOE (dyspnea on exertion) 10/23/2014  . Hyponatremia 10/23/2014  . RLL pneumonia ([place]) 10/11/2013  . Other specified anemias 10/10/2013  . Acute respiratory failure ([place]) 08/15/2013  . Aspiration pneumonia ([place]) 08/15/2013  . Altered mental status 08/15/2013  . Incarcerated femoral hernia 08/14/2013  . Esophageal reflux 08/05/2013  . Unspecified constipation 07/03/2013  . Closed fracture of left hip ([place]) 07/01/2013  . 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 Atrial fibrillation ([place]) 10/30/2012  . Low back pain 10/26/2012  . Syncope 10/26/2012  . PNA (pneumonia) 09/26/2012  . UTI (lower urinary tract infection) 09/26/2012  . Hypokalemia 09/26/2012  . Leukocytosis 09/26/2012  . Cardiac enzymes elevated 07/25/2011  . Impaired glucose tolerance 06/03/2011  . Preventative health care 06/03/2011  . IRON DEFICIENCY 12/06/2010  . 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 CVA (cerebral vascular accident) ([place]) 01/09/2009  . Aphasia 01/09/2009  . Urinary incontinence 01/09/2009  . PACEMAKER, PERMANENT 01/09/2009  . Dementia 05/19/2008  . Hypothyroidism 11/20/2007  . Hyperlipidemia 11/20/2007  . Essential hypertension 11/20/2007  . DIVERTICULOSIS, COLON 11/20/2007  . FATIGUE 11/20/2007  . CEREBROVASCULAR ACCIDENT, HX OF 11/20/2007  . TIA 05/14/2003    Past Surgical History:  Procedure Laterality Date  . ABDOMINAL HYSTERECTOMY    . CHOLECYSTECTOMY    .  CYSTOCELE REPAIR N/A 11/26/2012   Procedure: Cystocele Repair with [place] Colpocleisis.  colpectomy Levatorplasty Perineorraphy;  Surgeon: Lovenia Kim, MD;  Location: [facility];  Service: Gynecology;  Laterality: N/A;  Cystocele Repair with [place] Colpocleisis.  Marland Kitchen HIP ARTHROPLASTY Left 07/02/2013    Procedure: ARTHROPLASTY BIPOLAR HIP;  Surgeon: Mauri Pole, MD;  Location: [facility];  Service: Orthopedics;  Laterality: Left;  . INGUINAL HERNIA REPAIR Right 08/13/2013   Procedure: HERNIA REPAIR INGUINAL INCARCERATED;  Surgeon: Ralene Ok, MD;  Location: [facility];  Service: General;  Laterality: Right;  . Medtronic Cynthia dual chamber pacemaker serial number was [id number]...04/15/2009    . s/p cerebral aneurysm  1991  . s/p pacemaker DDD    . s/p retinal attachment    . s/p thyroidectomy      OB History    No data available       Home Medications    Prior to Admission medications   Medication Sig Start Date End Date Taking? Authorizing Provider  acetaminophen (TYLENOL) 325 MG tablet Take 650 mg by mouth every 6 (six) hours as needed (ARTHRITIS PAIN IN HANDS).   Yes Historical Provider, MD  ALPRAZolam Duanne Moron) 0.25 MG tablet TAKE 1/2 -1 TABLET BY MOUTH 2 TIMES DAILY AS NEEDED 08/07/14  Yes Biagio Borg, MD  amLODipine (NORVASC) 10 MG tablet Take 1 tablet (10 mg total) by mouth daily. Resume in 3 days if SBP > 150 04/26/16  Yes Biagio Borg, MD  Calcium Carbonate-Vitamin D (CALTRATE 600+D) 600-400 MG-UNIT per tablet Take 1 tablet by mouth at bedtime.    Yes Historical Provider, MD  clopidogrel (PLAVIX) 75 MG tablet Take 1 tablet (75 mg total) by mouth daily. 04/26/16  Yes Biagio Borg, MD  Docusate Sodium (DSS) 100 MG CAPS Take 100 mg by mouth every morning.  07/03/13  Yes Matthew Babish, PA-C  donepezil (ARICEPT) 10 MG tablet Take 1 tablet (10 mg total) by mouth daily. 04/26/16  Yes Biagio Borg, MD  levothyroxine (SYNTHROID, LEVOTHROID) 125 MCG tablet Take 1 tablet (125 mcg total) by mouth daily. 04/26/16  Yes Biagio Borg, MD  losartan (COZAAR) 100 MG tablet Take 1 tablet (100 mg total) by mouth daily. 04/26/16  Yes Biagio Borg, MD  Multiple Vitamin (MULTIVITAMIN WITH MINERALS) TABS Take 1 tablet by mouth every morning. Centrum silver   Yes Historical Provider, MD  polyethylene glycol  (MIRALAX / GLYCOLAX) packet Take 17 g by mouth daily as needed for mild constipation.   Yes Historical Provider, MD  polyvinyl alcohol (ARTIFICIAL TEARS) 1.4 % ophthalmic solution Place 1 drop into both eyes 4 (four) times daily.    Yes Historical Provider, MD  pravastatin (PRAVACHOL) 40 MG tablet Take 1 tablet (40 mg total) by mouth daily. 04/26/16  Yes Biagio Borg, MD  feeding supplement, ENSURE COMPLETE, (ENSURE COMPLETE) LIQD Take 237 mLs by mouth 2 (two) times daily between meals. 08/23/13   Erby Pian, NP    Family History Family History  Problem Relation Age of Onset  . Cancer Mother     breast cancer    Social History Social History  Substance Use Topics  . Smoking status: Never Smoker  . Smokeless tobacco: Never Used  . Alcohol use No     Allergies   Codeine   Review of Systems Review of Systems  Constitutional: Positive for fever. Negative for chills.  HENT: Positive for congestion. Negative for rhinorrhea.   Eyes: Negative for redness and visual disturbance.  Respiratory: Positive for cough and shortness of breath. Negative for wheezing.   Cardiovascular: Negative for chest pain and palpitations.  Gastrointestinal: Negative for nausea and vomiting.  Genitourinary: Negative for dysuria and urgency.  Musculoskeletal: Negative for arthralgias and myalgias.  Skin: Negative for pallor and wound.  Neurological: Negative for dizziness and headaches.     Physical Exam Updated Vital Signs BP 132/65 (BP Location: Right Arm)   Pulse 90   Temp 99.3 F (37.4 C) (Axillary)   Resp (!) 26   Ht 5\' 6"  (1.676 m)   Wt 152 lb 1.9 oz (69 kg)   SpO2 90%   BMI 24.55 kg/m   Physical Exam  Constitutional: She is oriented to person, place, and time. She appears well-developed and well-nourished. No distress.  HENT:  Head: Normocephalic and atraumatic.  Eyes: EOM are normal. Pupils are equal, round, and reactive to light.  Neck: Normal range of motion. Neck supple.    Cardiovascular: Normal rate and regular rhythm.  Exam reveals no gallop and no friction rub.   No murmur heard. Pulmonary/Chest: Effort normal. She has no wheezes. She has no rales.  Abdominal: Soft. She exhibits no distension and no mass. There is no tenderness. There is no guarding.  Musculoskeletal: She exhibits no edema or tenderness.  Neurological: She is alert and oriented to person, place, and time.  Skin: Skin is warm and dry. She is not diaphoretic.  Psychiatric: She has a normal mood and affect. Her behavior is normal.  Nursing note and vitals reviewed.    ED Treatments / Results  Labs (all labs ordered are listed, but only abnormal results are displayed) Labs Reviewed  CBC WITH DIFFERENTIAL/PLATELET - Abnormal; Notable for the following:       Result Value   Lymphs Abs 0.6 (*)    All other components within normal limits  COMPREHENSIVE METABOLIC PANEL - Abnormal; Notable for the following:    Sodium 132 (*)    Potassium 3.2 (*)    Chloride 96 (*)    Glucose, Bld 150 (*)    AST 50 (*)    All other components within normal limits  BRAIN NATRIURETIC PEPTIDE - Abnormal; Notable for the following:    B Natriuretic Peptide 700.7 (*)    All other components within normal limits  TROPONIN I - Abnormal; Notable for the following:    Troponin I 0.04 (*)    All other components within normal limits  I-STAT CHEM 8, ED - Abnormal; Notable for the following:    Sodium 132 (*)    Potassium 3.2 (*)    Chloride 96 (*)    Glucose, Bld 145 (*)    Calcium, Ion 1.04 (*)    Hemoglobin 15.3 (*)    All other components within normal limits  CULTURE, BLOOD (ROUTINE X 2)  CULTURE, BLOOD (ROUTINE X 2)  CULTURE, EXPECTORATED SPUTUM-ASSESSMENT  MRSA PCR SCREENING  LACTIC ACID, PLASMA  LACTIC ACID, PLASMA  MAGNESIUM  INFLUENZA PANEL BY PCR (TYPE A & B)  HEMOGLOBIN A1C  LIPID PANEL  TROPONIN I  TROPONIN I  STREP PNEUMONIAE URINARY ANTIGEN  LEGIONELLA PNEUMOPHILA SEROGP 1 UR AG   BASIC METABOLIC PANEL  CBC  I-STAT TROPOININ, ED    EKG  EKG Interpretation  Date/Time:  Tuesday January 17 2017 16:39:57 EDT Ventricular Rate:  72 PR Interval:    QRS Duration: 197 QT Interval:  492 QTC Calculation: 539 R Axis:   -80 Text Interpretation:  Sinus rhythm LVH with IVCD,  LAD and secondary repol abnrm Prolonged QT interval Baseline wander in lead(s) V5 V6 No significant change since last tracing Confirmed by Tzvi Economou MD, DANIEL [phone]) on 01/17/2017 4:54:52 PM       Radiology Dg Chest 2 View  Result Date: 01/17/2017 CLINICAL DATA:  Cough.  Fever. EXAM: CHEST  2 VIEW COMPARISON:  10/10/2013 chest radiograph. FINDINGS: Stable configuration of 2 lead left subclavian pacemaker. Stable cardiomediastinal silhouette with aortic atherosclerosis and top-normal heart size. No pneumothorax. No pleural effusion. Patchy left lower lobe opacities. No overt pulmonary edema. IMPRESSION: Patchy left lower lobe opacities, which may represent aspiration or pneumonia. Recommend follow-up PA and lateral post treatment chest radiographs in 4-6 weeks. Aortic atherosclerosis. Electronically Signed   By: Ilona Sorrel M.D.   On: 01/17/2017 17:26    Procedures Procedures (including critical care time)  Medications Ordered in ED Medications  polyethylene glycol (MIRALAX / GLYCOLAX) packet 17 g (not administered)  amLODipine (NORVASC) tablet 10 mg (not administered)  clopidogrel (PLAVIX) tablet 75 mg (not administered)  donepezil (ARICEPT) tablet 10 mg (not administered)  losartan (COZAAR) tablet 100 mg (not administered)  pravastatin (PRAVACHOL) tablet 40 mg (40 mg Oral Given 01/18/17 0004)  ALPRAZolam (XANAX) tablet 0.25 mg (not administered)  docusate sodium (COLACE) capsule 100 mg (not administered)  polyvinyl alcohol (LIQUIFILM TEARS) 1.4 % ophthalmic solution 1 drop (1 drop Both Eyes Given 01/17/17 2345)  multivitamin with minerals tablet 1 tablet (not administered)  albuterol (PROVENTIL)  (2.5 MG/3ML) 0.083% nebulizer solution 2.5 mg (not administered)  ipratropium (ATROVENT) nebulizer solution 0.5 mg (0.5 mg Nebulization Not Given 01/17/17 2344)  dextromethorphan-guaiFENesin ([place] DM) 30-600 MG per 12 hr tablet 1 tablet (not administered)  enoxaparin (LOVENOX) injection 40 mg (40 mg Subcutaneous Given 01/17/17 2344)  cefTRIAXone (ROCEPHIN) 1 g in dextrose 5 % 50 mL IVPB (not administered)  azithromycin (ZITHROMAX) 500 mg in dextrose 5 % 250 mL IVPB (not administered)  metroNIDAZOLE (FLAGYL) IVPB 500 mg (500 mg Intravenous New Bag/Given 01/17/17 2344)  levothyroxine (SYNTHROID, LEVOTHROID) tablet 125 mcg (not administered)  calcium-vitamin D (OSCAL WITH D) 500-200 MG-UNIT per tablet 1 tablet (1 tablet Oral Given 01/17/17 2335)  chlorpheniramine-HYDROcodone (TUSSIONEX) 10-8 MG/5ML suspension 2.5 mL (2.5 mLs Oral Given 01/17/17 1731)  acetaminophen (TYLENOL) tablet 1,000 mg (1,000 mg Oral Given 01/17/17 1731)  cefTRIAXone (ROCEPHIN) 1 g in dextrose 5 % 50 mL IVPB (0 g Intravenous Stopped 01/17/17 1930)  azithromycin (ZITHROMAX) 500 mg in dextrose 5 % 250 mL IVPB (0 mg Intravenous Stopped 01/17/17 2034)  ipratropium-albuterol (DUONEB) 0.5-2.5 (3) MG/3ML nebulizer solution 3 mL (3 mLs Nebulization Given 01/17/17 1809)  potassium chloride 20 MEQ/15ML (10%) solution 40 mEq (40 mEq Oral Given 01/17/17 2344)  furosemide (LASIX) injection 20 mg (20 mg Intravenous Given 01/17/17 2335)     Initial Impression / Assessment and Plan / ED Course  I have reviewed the triage vital signs and the nursing notes.  Pertinent labs & imaging results that were available during my care of the patient were reviewed by me and considered in my medical decision making (see chart for details).     83 yo F With a chief complaint of cough congestion fevers and shortness of breath. Hypoxic here. Will obtain a chest x-ray labs. Symptoms are likely to be pneumonia.  CXR with pna, no recent admit, will start on  rocephin azithro.  Given a breathing treatment with improvement.  D/c home.   12:32 AM:  I have discussed the diagnosis/risks/treatment options with the patient and  family and believe the pt to be eligible for discharge home to follow-up with PCP. We also discussed returning to the ED immediately if new or worsening sx occur. We discussed the sx which are most concerning (e.g., sudden worsening pain, fever, inability to tolerate by mouth) that necessitate immediate return. Medications administered to the patient during their visit and any new prescriptions provided to the patient are listed below.  Medications given during this visit Medications  polyethylene glycol (MIRALAX / GLYCOLAX) packet 17 g (not administered)  amLODipine (NORVASC) tablet 10 mg (not administered)  clopidogrel (PLAVIX) tablet 75 mg (not administered)  donepezil (ARICEPT) tablet 10 mg (not administered)  losartan (COZAAR) tablet 100 mg (not administered)  pravastatin (PRAVACHOL) tablet 40 mg (40 mg Oral Given 01/18/17 0004)  ALPRAZolam (XANAX) tablet 0.25 mg (not administered)  docusate sodium (COLACE) capsule 100 mg (not administered)  polyvinyl alcohol (LIQUIFILM TEARS) 1.4 % ophthalmic solution 1 drop (1 drop Both Eyes Given 01/17/17 2345)  multivitamin with minerals tablet 1 tablet (not administered)  albuterol (PROVENTIL) (2.5 MG/3ML) 0.083% nebulizer solution 2.5 mg (not administered)  ipratropium (ATROVENT) nebulizer solution 0.5 mg (0.5 mg Nebulization Not Given 01/17/17 2344)  dextromethorphan-guaiFENesin ([place] DM) 30-600 MG per 12 hr tablet 1 tablet (not administered)  enoxaparin (LOVENOX) injection 40 mg (40 mg Subcutaneous Given 01/17/17 2344)  cefTRIAXone (ROCEPHIN) 1 g in dextrose 5 % 50 mL IVPB (not administered)  azithromycin (ZITHROMAX) 500 mg in dextrose 5 % 250 mL IVPB (not administered)  metroNIDAZOLE (FLAGYL) IVPB 500 mg (500 mg Intravenous New Bag/Given 01/17/17 2344)  levothyroxine (SYNTHROID,  LEVOTHROID) tablet 125 mcg (not administered)  calcium-vitamin D (OSCAL WITH D) 500-200 MG-UNIT per tablet 1 tablet (1 tablet Oral Given 01/17/17 2335)  chlorpheniramine-HYDROcodone (TUSSIONEX) 10-8 MG/5ML suspension 2.5 mL (2.5 mLs Oral Given 01/17/17 1731)  acetaminophen (TYLENOL) tablet 1,000 mg (1,000 mg Oral Given 01/17/17 1731)  cefTRIAXone (ROCEPHIN) 1 g in dextrose 5 % 50 mL IVPB (0 g Intravenous Stopped 01/17/17 1930)  azithromycin (ZITHROMAX) 500 mg in dextrose 5 % 250 mL IVPB (0 mg Intravenous Stopped 01/17/17 2034)  ipratropium-albuterol (DUONEB) 0.5-2.5 (3) MG/3ML nebulizer solution 3 mL (3 mLs Nebulization Given 01/17/17 1809)  potassium chloride 20 MEQ/15ML (10%) solution 40 mEq (40 mEq Oral Given 01/17/17 2344)  furosemide (LASIX) injection 20 mg (20 mg Intravenous Given 01/17/17 2335)     The patient appears reasonably screen and/or stabilized for discharge and I doubt any other medical condition or other [facility] requiring further screening, evaluation, or treatment in the ED at this time prior to discharge.    Final Clinical Impressions(s) / ED Diagnoses   Final diagnoses:  Community acquired pneumonia of left lower lobe of lung ([place])  Acute respiratory failure with hypoxia [facility])    New Prescriptions Current Discharge Medication List       Deno Etienne, DO 01/18/17 0032

## 2017-01-18 ENCOUNTER — Other Ambulatory Visit (HOSPITAL_COMMUNITY): Payer: Medicare Other

## 2017-01-18 ENCOUNTER — Encounter: Payer: Self-pay | Admitting: Internal Medicine

## 2017-01-18 DIAGNOSIS — F0390 Unspecified dementia without behavioral disturbance: Secondary | ICD-10-CM

## 2017-01-18 DIAGNOSIS — J9601 Acute respiratory failure with hypoxia: Secondary | ICD-10-CM

## 2017-01-18 LAB — BASIC METABOLIC PANEL
ANION GAP: 9 (ref 5–15)
BUN: 15 mg/dL (ref 6–20)
CALCIUM: 8.1 mg/dL — AB (ref 8.9–10.3)
CO2: 24 mmol/L (ref 22–32)
Chloride: 103 mmol/L (ref 101–111)
Creatinine, Ser: 0.71 mg/dL (ref 0.44–1.00)
GFR calc Af Amer: >60 mL/min (ref >60)
GLUCOSE: 106 mg/dL — AB (ref 65–99)
Potassium: 3.4 mmol/L — ABNORMAL LOW (ref 3.5–5.1)
SODIUM: 136 mmol/L (ref 135–145)

## 2017-01-18 LAB — INFLUENZA PANEL BY PCR (TYPE A & B)
INFLAPCR: NEGATIVE
Influenza B By PCR: NEGATIVE

## 2017-01-18 LAB — CBC
HCT: 35.8 % — ABNORMAL LOW (ref 36.0–46.0)
HEMOGLOBIN: 12.4 g/dL (ref 12.0–15.0)
MCH: 30.8 pg (ref 26.0–34.0)
MCHC: 34.6 g/dL (ref 30.0–36.0)
MCV: 88.8 fL (ref 78.0–100.0)
Platelets: 192 10*3/uL (ref 150–400)
RBC: 4.03 MIL/uL (ref 3.87–5.11)
RDW: 13 % (ref 11.5–15.5)
WBC: 6.6 10*3/uL (ref 4.0–10.5)

## 2017-01-18 LAB — EXPECTORATED SPUTUM ASSESSMENT W GRAM STAIN, RFLX TO RESP C

## 2017-01-18 LAB — LACTIC ACID, PLASMA
LACTIC ACID, VENOUS: 1 mmol/L (ref 0.5–1.9)
Lactic Acid, Venous: 0.9 mmol/L (ref 0.5–1.9)
Lactic Acid, Venous: 2.7 mmol/L (ref 0.5–1.9)

## 2017-01-18 LAB — LIPID PANEL
CHOL/HDL RATIO: 2.6 ratio
Cholesterol: 124 mg/dL (ref 0–200)
HDL: 48 mg/dL (ref >40)
LDL CALC: 61 mg/dL (ref 0–99)
TRIGLYCERIDES: 76 mg/dL (ref <150)
VLDL: 15 mg/dL (ref 0–40)

## 2017-01-18 LAB — MRSA PCR SCREENING: MRSA BY PCR: NEGATIVE

## 2017-01-18 LAB — MAGNESIUM: Magnesium: 1.9 mg/dL (ref 1.7–2.4)

## 2017-01-18 LAB — EXPECTORATED SPUTUM ASSESSMENT W REFEX TO RESP CULTURE

## 2017-01-18 LAB — STREP PNEUMONIAE URINARY ANTIGEN: STREP PNEUMO URINARY ANTIGEN: NEGATIVE

## 2017-01-18 LAB — TROPONIN I
Troponin I: 0.04 ng/mL (ref <0.03)
Troponin I: 0.04 ng/mL (ref <0.03)

## 2017-01-18 MED ORDER — IPRATROPIUM-ALBUTEROL 0.5-2.5 (3) MG/3ML IN SOLN
3.0000 mL | Freq: Four times a day (QID) | RESPIRATORY_TRACT | Status: DC
  Administered 2017-01-18 – 2017-01-19 (×4): 3 mL via RESPIRATORY_TRACT
  Filled 2017-01-18 (×4): qty 3

## 2017-01-18 MED ORDER — IPRATROPIUM BROMIDE 0.02 % IN SOLN
0.5000 mg | Freq: Three times a day (TID) | RESPIRATORY_TRACT | Status: DC
  Administered 2017-01-18: 0.5 mg via RESPIRATORY_TRACT
  Filled 2017-01-18: qty 2.5

## 2017-01-18 MED ORDER — CHLORHEXIDINE GLUCONATE 0.12 % MT SOLN
15.0000 mL | Freq: Two times a day (BID) | OROMUCOSAL | Status: DC
  Administered 2017-01-18 – 2017-01-21 (×6): 15 mL via OROMUCOSAL
  Filled 2017-01-18 (×6): qty 15

## 2017-01-18 MED ORDER — PIPERACILLIN-TAZOBACTAM 3.375 G IVPB 30 MIN
3.3750 g | Freq: Three times a day (TID) | INTRAVENOUS | Status: DC
  Administered 2017-01-18 – 2017-01-21 (×9): 3.375 g via INTRAVENOUS
  Filled 2017-01-18 (×11): qty 50

## 2017-01-18 MED ORDER — ONDANSETRON 4 MG PO TBDP
4.0000 mg | ORAL_TABLET | Freq: Three times a day (TID) | ORAL | Status: DC | PRN
  Administered 2017-01-18 – 2017-01-19 (×2): 4 mg via ORAL
  Filled 2017-01-18 (×2): qty 1

## 2017-01-18 MED ORDER — ORAL CARE MOUTH RINSE
15.0000 mL | Freq: Two times a day (BID) | OROMUCOSAL | Status: DC
  Administered 2017-01-18 – 2017-01-21 (×6): 15 mL via OROMUCOSAL

## 2017-01-18 MED ORDER — SODIUM CHLORIDE 0.9 % IV SOLN
INTRAVENOUS | Status: DC
  Administered 2017-01-18: 03:00:00 via INTRAVENOUS

## 2017-01-18 MED ORDER — SODIUM CHLORIDE 0.9 % IV SOLN: INTRAVENOUS | Status: DC

## 2017-01-18 MED ORDER — POTASSIUM CHLORIDE IN NACL 20-0.9 MEQ/L-% IV SOLN
INTRAVENOUS | Status: DC
  Administered 2017-01-18: 13:00:00 via INTRAVENOUS
  Filled 2017-01-18: qty 1000

## 2017-01-18 MED ORDER — BENZONATATE 100 MG PO CAPS
100.0000 mg | ORAL_CAPSULE | Freq: Three times a day (TID) | ORAL | Status: DC
  Administered 2017-01-18 – 2017-01-21 (×10): 100 mg via ORAL
  Filled 2017-01-18 (×10): qty 1

## 2017-01-18 NOTE — Clinical Social Work Note (Signed)
Clinical Social Work Assessment  Patient Details  Name: [name] MRN: [id number] Date of Birth: Sep 10, 1922  Date of referral:  01/18/17               Reason for consult:  Facility Placement                Permission sought to share information with:  Facility Art therapist granted to share information::  Yes, Verbal Permission Granted  Name::        Agency::     Relationship::     Contact Information:     Housing/Transportation Living arrangements for the past 2 months:  [place] of Information:  Patient, Adult Children (Son - [name]) Patient Interpreter Needed:  None Criminal Activity/Legal Involvement Pertinent to Current Situation/Hospitalization:  No - Comment as needed Significant Relationships:  Adult Children Lives with:  Facility Resident Do you feel safe going back to the place where you live?  Yes Need for family participation in patient care:  Yes (Comment)  Care giving concerns:  Patient currently resides at ALF and wants to return, no concerns reported.    Social Worker assessment / plan:  CSW spoke with patient at bedside, patient alert and oriented to person and situation. Patient reported that she has been residing at [facility] for the past 11 years and she's always enjoyed it there. Patient reports that she wants to return to [facility] at discharge. CSW contacted patient's son [name]) he confirmed patient's plan to return to current ALF, noting patient has extra care at ALF from CNAs to assist patient with showers, medication, going to dining hall, etc. CSW contacted [facility] and left voicemail requesting return phone call. CSW will assist patient/patient's son with discharge back to ALF when medically stable.   Employment status:  Retired Health visitor, Programmer, applications PT Recommendations:  Not assessed at this time Information / Referral to community  resources:  Other (Comment Required) ([place] )  Patient/Family's Response to care: Patient and patient's son agreeable to patient's return to current ALF at discharge.   Patient/Family's Understanding of and Emotional Response to Diagnosis, Current Treatment, and Prognosis:  Patient reports that her cough is continuing to bother her and that she wants to get better.   Emotional Assessment Appearance:  Appears stated age Attitude/Demeanor/Rapport:  Other (Cooperative) Affect (typically observed):  Pleasant Orientation:  Oriented to Self, Oriented to Situation Alcohol / Substance use:  Not Applicable Psych involvement (Current and /or in the community):  No (Comment)  Discharge Needs  Concerns to be addressed:  Care Coordination Readmission within the last 30 days:  No Current discharge risk:  None Barriers to Discharge:  Continued Medical Work up   [employer], LCSW 01/18/2017, 9:53 AM

## 2017-01-18 NOTE — Progress Notes (Signed)
PROGRESS NOTE  [name] RCV:[id number] DOB: April 09, 1922 DOA: 01/17/2017 PCP: Cathlean Cower, MD  Brief History:  83 year old female with history of hypertension, hyperlipidemia, stroke, complete AV block status post PPM, dementia, atrial fibrillation presented with 2-3 day history of increasing shortness of breath, chest congestion, and coughing. There was no fevers, chills, nausea, vomiting, diarrhea, hemoptysis, headache, neck pain. The patient resides at [facility], and she is normally able to ambulate with a Rollator, but requires some assistance with bathing and dressing. She went to see her primary care provider, and the patient was noted to have oxygen saturation of 82% on room air. As a result, the patient was sent to the emergency department for further evaluation. Chest x-ray showed increasing left lower lobe opacities. The patient was started on intravenous antibiotics.  Assessment/Plan: Acute respiratory failure with hypoxia -Secondary to pneumonia -stable on 4 L nasal cannula -Pulmonary hygiene -start bronchodilators  Aspiration pneumonia/HCAP -speech therapy eval -d/c ceftriaxone -start zosyn  Atrial fibrillation -CHADS-VASc = 6 -previously documented not to be AC candidate by Dr. Kathlyn [place] due to age, co-morbidities and high risk of bleeding -rate controlled -continue plavix  Complete Heart Block -follows Dr. Crissie Sickles -s/p PPM  History of stroke -PT eval -speech therapy eval  HTN -continue amlodipine and losartan -BP controlled  Dementia -continue Aricept  Hypokalemia -replete -check mag  Hyperlipidemia -continue statin   Disposition Plan:   Home in 2-3 days  Family Communication:   Son and daughter-in-law at bedside updated 4/25--Total time spent 35 minutes.  Greater than 50% spent face to face counseling and coordinating care.   Consultants:  none  Code Status:  FULL   DVT Prophylaxis:    Lovenox   Procedures: As Listed in Progress Note Above  Antibiotics: Zosyn 4/25>>> Azithromycin 4/24>>> Ceftriaxone 4/24>>>4/25 Flagyl 4/24>>>4/25    Subjective: Patient complains of coughing resulting in upper abdominal discomfort. Also complains of constipation. Denies any fevers, chills, chest pain, nausea, vomiting, diarrhea, dysuria. Complains of shortness of breath   Objecti complaints of some shortness of breath.ve: Vitals:   01/18/17 0136 01/18/17 0245 01/18/17 0427 01/18/17 0926  BP:  (!) 132/53 130/66   Pulse:  63 (!) 54 81  Resp:  (!) 22 (!) 21 20  Temp:  98.1 F (36.7 C) 98.7 F (37.1 C)   TempSrc:  Oral Oral   SpO2: 93% 93% 97% 91%  Weight:   69 kg (152 lb 1.9 oz)   Height:        Intake/Output Summary (Last 24 hours) at 01/18/17 1202 Last data filed at 01/18/17 1000  Gross per 24 hour  Intake           793.75 ml  Output              300 ml  Net           493.75 ml   Weight change:  Exam:   General:  Pt is alert, follows commands appropriately, not in acute distress  HEENT: No icterus, No thrush, No neck mass, [place]/AT  Cardiovascular: RRR, S1/S2, no rubs, no gallops  Respiratory: Bilateral rhonchi; bilateral expiratory wheeze   Abdomen: Soft/+BS, non tender, non distended, no guarding  Extremities: No edema, No lymphangitis, No petechiae, No rashes, no synovitis   Data Reviewed: I have personally reviewed following labs and imaging studies Basic Metabolic Panel:  Recent Labs Lab 01/17/17 1643 01/17/17 1758 01/18/17 0446  NA 132* 132* 136  K 3.2* 3.2* 3.4*  CL 96* 96* 103  CO2 23  --  24  GLUCOSE 150* 145* 106*  BUN 18 17 15   CREATININE 0.81 0.70 0.71  CALCIUM 9.1  --  8.1*  MG  --   --  1.9   Liver Function Tests:  Recent Labs Lab 01/17/17 1643  AST 50*  ALT 29  ALKPHOS 65  BILITOT 0.6  PROT 7.9  ALBUMIN 4.4   No results for input(s): LIPASE, AMYLASE in the last 168 hours. No results for input(s): AMMONIA in the last  168 hours. Coagulation Profile: No results for input(s): INR, PROTIME in the last 168 hours. CBC:  Recent Labs Lab 01/17/17 1643 01/17/17 1758 01/18/17 0446  WBC 8.2  --  6.6  NEUTROABS 6.8  --   --   HGB 14.4 15.3* 12.4  HCT 40.4 45.0 35.8*  MCV 87.4  --  88.8  PLT 222  --  192   Cardiac Enzymes:  Recent Labs Lab 01/17/17 2149 01/18/17 0446 01/18/17 0738  TROPONINI 0.04* 0.04* 0.04*   BNP: Invalid input(s): POCBNP CBG: No results for input(s): GLUCAP in the last 168 hours. HbA1C: No results for input(s): HGBA1C in the last 72 hours. Urine analysis:    Component Value Date/Time   COLORURINE YELLOW 10/27/2016 1224   APPEARANCEUR CLEAR 10/27/2016 1224   LABSPEC 1.010 10/27/2016 1224   PHURINE 6.0 10/27/2016 1224   GLUCOSEU NEGATIVE 10/27/2016 1224   HGBUR NEGATIVE 10/27/2016 1224   BILIRUBINUR NEGATIVE 10/27/2016 1224   KETONESUR NEGATIVE 10/27/2016 1224   PROTEINUR NEGATIVE 01/11/2015 1000   UROBILINOGEN 0.2 10/27/2016 1224   NITRITE NEGATIVE 10/27/2016 1224   LEUKOCYTESUR NEGATIVE 10/27/2016 1224   Sepsis Labs: @LABRCNTIP (procalcitonin:4,lacticidven:4) ) Recent Results (from the past 240 hour(s))  Blood culture (routine x 2)     Status: None (Preliminary result)   Collection Time: 01/17/17  4:52 PM  Result Value Ref Range Status   Specimen Description BLOOD RIGHT ANTECUBITAL  Final   Special Requests   Final    BOTTLES DRAWN AEROBIC AND ANAEROBIC Blood Culture adequate volume Performed at [facility], [place] [address], [place], [place] [address]    Culture PENDING  Incomplete   Report Status PENDING  Incomplete  Blood culture (routine x 2)     Status: None (Preliminary result)   Collection Time: 01/17/17  4:57 PM  Result Value Ref Range Status   Specimen Description BLOOD RIGHT HAND  Final   Special Requests   Final    IN PEDIATRIC BOTTLE Blood Culture adequate volume Performed at [facility] [address] [place],  [address]     Culture PENDING  Incomplete   Report Status PENDING  Incomplete  MRSA PCR Screening     Status: None   Collection Time: 01/17/17 11:59 PM  Result Value Ref Range Status   MRSA by PCR NEGATIVE NEGATIVE Final    Comment:        The GeneXpert MRSA Assay (FDA approved for NASAL specimens only), is one component of a comprehensive MRSA colonization surveillance program. It is not intended to diagnose MRSA infection nor to guide or monitor treatment for MRSA infections.   Culture, sputum-assessment     Status: None   Collection Time: 01/18/17  7:34 AM  Result Value Ref Range Status   Specimen Description SPUTUM  Final   Special Requests NONE  Final   Sputum evaluation THIS SPECIMEN IS ACCEPTABLE FOR SPUTUM CULTURE  Final  Report Status 01/18/2017 FINAL  Final     Scheduled Meds: . amLODipine  10 mg Oral Daily  . calcium-vitamin D  1 tablet Oral QHS  . chlorhexidine  15 mL Mouth Rinse BID  . clopidogrel  75 mg Oral Daily  . docusate sodium  100 mg Oral q morning - 10a  . donepezil  10 mg Oral Daily  . enoxaparin (LOVENOX) injection  40 mg Subcutaneous Q24H  . ipratropium  0.5 mg Nebulization TID  . levothyroxine  125 mcg Oral QAC breakfast  . losartan  100 mg Oral Daily  . mouth rinse  15 mL Mouth Rinse q12n4p  . multivitamin with minerals  1 tablet Oral q morning - 10a  . polyvinyl alcohol  1 drop Both Eyes QID  . pravastatin  40 mg Oral Daily   Continuous Infusions: . sodium chloride 75 mL/hr at 01/18/17 0245  . azithromycin    . cefTRIAXone (ROCEPHIN)  IV    . metronidazole Stopped (01/18/17 1735)    Procedures/Studies: Dg Chest 2 View  Result Date: 01/17/2017 CLINICAL DATA:  Cough.  Fever. EXAM: CHEST  2 VIEW COMPARISON:  10/10/2013 chest radiograph. FINDINGS: Stable configuration of 2 lead left subclavian pacemaker. Stable cardiomediastinal silhouette with aortic atherosclerosis and top-normal heart size. No pneumothorax. No pleural effusion. Patchy left lower  lobe opacities. No overt pulmonary edema. IMPRESSION: Patchy left lower lobe opacities, which may represent aspiration or pneumonia. Recommend follow-up PA and lateral post treatment chest radiographs in 4-6 weeks. Aortic atherosclerosis. Electronically Signed   By: Ilona Sorrel M.D.   On: 01/17/2017 17:26    Shandee Jergens, DO  [facility] Pager [phone]  If 7PM-7AM, please contact night-coverage [email or web address] Password [email or web address] 01/18/2017, 12:02 PM   LOS: 1 day

## 2017-01-18 NOTE — Evaluation (Signed)
Physical Therapy Evaluation Patient Details Name: [name] MRN: [id number] DOB: [birth year] Today's Date: 01/18/2017   History of Present Illness   83 y.o. female with medical history significant of hypertension, hyperlipidemia, hypothyroidism, anxiety, TIA, stroke with mild right leg weakness and aphasia, complete AV block, s/p of pacemaker placement, dementia, a fib not on anticoagulants, who presents with shortness breath, cough and chest congestion. Dx of PNA, acute on chronic respiratory failure.   Clinical Impression  Pt admitted with above diagnosis. Pt currently with functional limitations due to the deficits listed below (see PT Problem List). Min A to pivot bed to recliner, pt had 3/4 dyspnea with transfer, SaO2 92% on 4L O2 with activity.  Pt will benefit from skilled PT to increase their independence and safety with mobility to allow discharge to the venue listed below.       Follow Up Recommendations Home health PT;Supervision/Assistance - 24 hour;Supervision for mobility/OOB (HHPT at ALF, per social work note pt plans to return to ALF with assist from CNA)    Financial risk analyst (measurements PT) (if pt doesn't have one)    Recommendations for Other Services OT consult     Precautions / Restrictions Precautions Precautions: Fall Precaution Comments: monitor O2 Restrictions Weight Bearing Restrictions: No      Mobility  Bed Mobility Overal bed mobility: Modified Independent             General bed mobility comments: HOB up 40*, used rail  Transfers Overall transfer level: Needs assistance Equipment used: Rolling walker (2 wheeled) Transfers: Sit to/from [facility] Sit to Stand: Min assist Stand pivot transfers: Min guard       General transfer comment: min A to rise, min/guard for safety, several pivotal steps to recliner with RW, no LOB, no ambulation 2* 3/4 dyspnea with transfer, SaO2 92% on 4L O2 with  activity  Ambulation/Gait                Stairs            Wheelchair Mobility    Modified Rankin (Stroke Patients Only)       Balance Overall balance assessment: Needs assistance   Sitting balance-[name] Scale: Good     Standing balance support: Bilateral upper extremity supported Standing balance-[name] Scale: Poor Standing balance comment: relies on BUE support                             Pertinent Vitals/Pain Pain Assessment: Faces Faces Pain Scale: Hurts little more Pain Location: chest pain with coughing Pain Descriptors / Indicators: Aching Pain Intervention(s): Limited activity within patient's tolerance;Monitored during session    Home Living Family/patient expects to be discharged to:: Assisted living               Home Equipment: Walker - 4 wheels      Prior Function Level of Independence: Needs assistance   Gait / Transfers Assistance Needed: used rollator  ADL's / Homemaking Assistance Needed: CNA assists with ADLs  Comments: legally blind     Hand Dominance        Extremity/Trunk Assessment   Upper Extremity Assessment Upper Extremity Assessment: Defer to OT evaluation    Lower Extremity Assessment Lower Extremity Assessment: Overall WFL for tasks assessed (knee ext +4/5 B)    Cervical / Trunk Assessment Cervical / Trunk Assessment: Normal  Communication   Communication: HOH  Cognition Arousal/Alertness: Awake/alert Behavior During Therapy: [facility]  for tasks assessed/performed Overall Cognitive Status: No family/caregiver present to determine baseline cognitive functioning (noted h/o dementia, pt able to follow commands)                                        General Comments      Exercises     Assessment/Plan    PT Assessment Patient needs continued PT services  PT Problem List Decreased activity tolerance;Decreased balance;Decreased mobility;Pain;Cardiopulmonary status limiting  activity       PT Treatment Interventions Gait training;Therapeutic exercise;Therapeutic activities;Balance training;Functional mobility training;Patient/family education    PT Goals (Current goals can be found in the Care Plan section)  Acute Rehab PT Goals Patient Stated Goal: stop coughing PT Goal Formulation: With patient Time For Goal Achievement: 02/01/17 Potential to Achieve Goals: Good    Frequency Min 3X/week   Barriers to discharge        Co-evaluation               End of Session Equipment Utilized During Treatment: Gait belt;Oxygen Activity Tolerance: Patient limited by fatigue Patient left: in chair;with call bell/phone within reach;with chair alarm set Nurse Communication: Mobility status PT Visit Diagnosis: Difficulty in walking, not elsewhere classified (R26.2)    Time: 1311-1330 PT Time Calculation (min) (ACUTE ONLY): 19 min   Charges:   PT Evaluation $PT Eval Low Complexity: 1 Procedure     PT G CodesPhilomena Doheny 01/18/2017, 1:41 PM [phone]

## 2017-01-18 NOTE — Evaluation (Signed)
Clinical/Bedside Swallow Evaluation Patient Details  Name: [name] MRN: [id number] Date of Birth: December 02, 1921  Today's Date: 01/18/2017 Time: SLP Start Time (ACUTE ONLY): 1145 SLP Stop Time (ACUTE ONLY): 1209 (2nd visit + 15 minutes) SLP Time Calculation (min) (ACUTE ONLY): 24 min  Past Medical History:  Past Medical History:  Diagnosis Date  . Aphasia 01/09/2009  . Arthritis    right hand  . 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 CEREBROVASCULAR ACCIDENT, HX OF 11/20/2007  . CHF (congestive heart failure) ([place])   . Coronary artery disease   . CVA 01/09/2009  . DEMENTIA 05/19/2008  . Dementia   . DIVERTICULOSIS, COLON 11/20/2007  . FATIGUE 11/20/2007  . Femoral fracture ([place]) 07/01/2013  . GLUCOSE INTOLERANCE 12/06/2010  . HYPERLIPIDEMIA 11/20/2007  . HYPERTENSION 11/20/2007   Echo was done 07/07/11: mod LVH, EF 55-60%, grade 1 diast dysfxn, mild MR, mild LAE, mod TR, PASP 39  . HYPOTHYROIDISM 11/20/2007  . Impaired glucose tolerance 06/03/2011  . IRON DEFICIENCY 12/06/2010  . Pacemaker 2002  . PACEMAKER, PERMANENT 01/09/2009  . TIA 05/14/2003   elevated CE's in setting of TIA and falls in 10/12 - echo normal with no further cardiac w/u pursued  . TIA (transient ischemic attack) 10/10/2011  . Urinary incontinence 01/09/2009   Past Surgical History:  Past Surgical History:  Procedure Laterality Date  . ABDOMINAL HYSTERECTOMY    . CHOLECYSTECTOMY    . CYSTOCELE REPAIR N/A 11/26/2012   Procedure: Cystocele Repair with Lefort Colpocleisis.  colpectomy Levatorplasty Perineorraphy;  Surgeon: Lovenia Kim, MD;  Location: [facility];  Service: Gynecology;  Laterality: N/A;  Cystocele Repair with Lefort Colpocleisis.  Marland Kitchen HIP ARTHROPLASTY Left 07/02/2013   Procedure: ARTHROPLASTY BIPOLAR HIP;  Surgeon: Mauri Pole, MD;  Location: [facility];  Service: Orthopedics;  Laterality: Left;  . INGUINAL HERNIA REPAIR Right 08/13/2013   Procedure: HERNIA REPAIR INGUINAL INCARCERATED;  Surgeon: Ralene Ok, MD;  Location: [facility];  Service: General;  Laterality: Right;  . Medtronic Cynthia dual chamber pacemaker serial number was [id number] H...04/15/2009    . s/p cerebral aneurysm  1991  . s/p pacemaker DDD    . s/p retinal attachment    . s/p thyroidectomy     HPI:  83 yo female adm to [facility] with breathing difficulty.  Pt is a resident of [facility] and is on a regular/thin diet prior to admission.  Family denies pt having pna since 2014 and report pt has a good appetite/intake. Pt CXR concerning for aspiration or pna- left lower lobe.  Pt has h/o aphasia from [facility] 2007 but denies dysphagia.  Pt also has h/o dementia, reflux, asp pna in 2014.  She has no prior SLP evaluations in the system we can observe, - MBS in 2007- no results.        Assessment / Plan / Recommendation Clinical Impression  Pt presents with no indication of oropharyngeal dysphagia clinically. She has negative CN exam but is generally weak.  3 ounce water test passed without indication of airway compromise.  Swallow was minimally delayed but no indication of airway compromise or residuals after first ice chip boluses.  Pt does have minimal increased work of breathing and cough x1 - no coorelated to intake - stopping herself and requesting break.  Recommend to start soft/thin diet with strict aspiration precautions.  SLP to follow up to determine tolerance or indication for instrumental evaluation.  RN, pt and family educated and agreeable to plan.  Family denies pt having  dysphagia and report no pna since 2014 (asp pna due to bowel obstruction/vomiting) and weight maintenance.  SLP Visit Diagnosis: Dysphagia, unspecified (R13.10)    Aspiration Risk    Moderate   Diet Recommendation Dysphagia 3 (Mech soft);Thin liquid   Liquid Administration via: Cup Medication Administration: Whole meds with puree Supervision: Staff to assist with self feeding Compensations: Slow rate;Small sips/bites Postural Changes: Seated upright at  90 degrees;Remain upright for at least 30 minutes after po intake    Other  Recommendations Oral Care Recommendations: Oral care BID   Follow up Recommendations        Frequency and Duration min 1 x/week  1 week       Prognosis Prognosis for Safe Diet Advancement: Fair      Swallow Study   General Date of Onset: 01/18/17 HPI: 83 yo female adm to [facility] with breathing difficulty.  Pt is a resident of [facility] and is on a regular/thin diet prior to admission.  Family denies pt having pna since 2014 and report pt has a good appetite/intake. Pt CXR concerning for aspiration or pna- left lower lobe.  Pt has h/o aphasia from [facility] 2007 but denies dysphagia.  Pt also has h/o dementia, reflux, asp pna in 2014.  She has no prior SLP evaluations in the system.    Type of Study: Bedside Swallow Evaluation Previous Swallow Assessment: MBS 2007-no results in epic Diet Prior to this Study: NPO Temperature Spikes Noted: Yes (99.3) Respiratory Status: Nasal cannula (not on o2 prior to admission) Behavior/Cognition: Alert;Confused;Distractible;Requires cueing Oral Cavity Assessment: Excessive secretions (pt spitting out excessive frothy secretions) Oral Care Completed by SLP: No Oral Cavity - Dentition: Adequate natural dentition Self-Feeding Abilities: Able to feed self Patient Positioning: Upright in bed Baseline Vocal Quality: Hoarse;Low vocal intensity Volitional Cough: Weak Volitional Swallow: Unable to elicit    Oral/Motor/Sensory Function Overall Oral Motor/Sensory Function: Generalized oral weakness (generalized weakness)   Ice Chips Ice chips: Impaired Presentation: Spoon Pharyngeal Phase Impairments: Suspected delayed Swallow;Decreased hyoid-laryngeal movement;Multiple swallows   Thin Liquid Thin Liquid: Within functional limits Presentation: Cup;Self Fed;Straw    Nectar Thick Nectar Thick Liquid: Not tested   Honey Thick Honey Thick Liquid: Not tested   Puree Puree: Within  functional limits Presentation: Spoon   Solid   GO   Solid: Within functional limits Presentation: [place], [facility] SLP [phone]

## 2017-01-18 NOTE — Evaluation (Signed)
Occupational Therapy Evaluation Patient Details Name: [name] MRN: [id number] DOB: 14-Aug-1922 Today's Date: 01/18/2017    History of Present Illness  83 y.o. female with medical history significant of hypertension, hyperlipidemia, hypothyroidism, anxiety, TIA, stroke with mild right leg weakness and aphasia, complete AV block, s/p of pacemaker placement, dementia, a fib not on anticoagulants, who presents with shortness breath, cough and chest congestion. Dx of PNA, acute on chronic respiratory failure.    Clinical Impression   This 83 y/o F presents with the above. Pt receives assist with some ADLs at baseline, and is limited mostly by dyspnea this session. Pt will benefit from continued OT services to increase endurance, safety, and independence with ADLs and functional mobility. Goals are for MinA to MinGuard Assist.     Follow Up Recommendations  Supervision/Assistance - 24 hour;Home health OT    Equipment Recommendations  Other (comment);3 in 1 bedside commode (If Pt does not currently have )       Precautions / Restrictions Precautions Precautions: Fall Precaution Comments: monitor O2 Restrictions Weight Bearing Restrictions: No      Mobility Bed Mobility Overal bed mobility: Modified Independent             General bed mobility comments: OOB by PT   Transfers Overall transfer level: Needs assistance Equipment used: Rolling walker (2 wheeled) Transfers: Sit to/from [facility] Sit to Stand: Min assist Stand pivot transfers: Min assist       General transfer comment: MinA to rise, verbal cues for sequencing use of RW with steps for stand pivot transfer, Pt with increased SOB during functional mobility     Balance Overall balance assessment: Needs assistance   Sitting balance-[name] Scale: Good     Standing balance support: Bilateral upper extremity supported Standing balance-[name] Scale: Poor Standing balance comment: relies on  BUE support                           ADL either performed or assessed with clinical judgement   ADL Overall ADL's : Needs assistance/impaired Eating/Feeding: Set up;Sitting   Grooming: Wash/dry hands;Wash/dry face;Min guard;Sitting   Upper Body Bathing: Min guard;Sitting   Lower Body Bathing: Minimal assistance;Sit to/from stand   Upper Body Dressing : Min guard;Sitting   Lower Body Dressing: Sit to/from stand;Moderate assistance;With caregiver independent assisting   Toilet Transfer: Minimal assistance;Stand-pivot;Cueing for safety;BSC;RW   Toileting- Clothing Manipulation and Hygiene: Minimal assistance;Sit to/from stand       Functional mobility during ADLs: Minimal assistance;Rolling walker General ADL Comments: Pt completed seated ADLs, stand pivot to [facility] for toileting, O2 sats remaining 93-94% during movement on 4L continuous                          Pertinent Vitals/Pain Pain Assessment: Faces Faces Pain Scale: Hurts little more Pain Location: chest pain with coughing Pain Descriptors / Indicators: Aching Pain Intervention(s): Limited activity within patient's tolerance;Monitored during session     Hand Dominance     Extremity/Trunk Assessment Upper Extremity Assessment Upper Extremity Assessment: Generalized weakness   Lower Extremity Assessment Lower Extremity Assessment:  Cervical / Trunk Assessment Cervical / Trunk Assessment: Normal   Communication Communication Communication: HOH   Cognition Arousal/Alertness: Awake/alert Behavior During Therapy: WFL for tasks assessed/performed Overall Cognitive Status: No family/caregiver present to determine baseline cognitive functioning  General Comments: Pt with hx of dementia    General Comments                  Home Living Family/patient expects to be discharged to:: Assisted living                             Home  Equipment: Walker - 4 wheels          Prior Functioning/Environment Level of Independence: Needs assistance  Gait / Transfers Assistance Needed: used rollator ADL's / Homemaking Assistance Needed: family not present during session to obtain PLOF, per physician note Pt receives assist with ADLs including bathing and LB dressing   Comments: legally blind        OT Problem List: Decreased activity tolerance;Decreased strength;Cardiopulmonary status limiting activity      OT Treatment/Interventions: Self-care/ADL training;Energy conservation;Therapeutic exercise;DME and/or AE instruction;Patient/family education;Therapeutic activities    OT Goals(Current goals can be found in the care plan section) Acute Rehab OT Goals Patient Stated Goal: stop coughing OT Goal Formulation: With patient Time For Goal Achievement: 01/25/17 Potential to Achieve Goals: Good ADL Goals Pt Will Perform Grooming: with set-up;sitting Pt Will Transfer to Toilet: with min guard assist;stand pivot transfer;bedside commode Pt Will Perform Toileting - Clothing Manipulation and hygiene: with min guard assist;sit to/from stand Additional ADL Goal #1: Pt will demonstrate at least one energy conservation technique during ADL task completion with Min verbal cues.  OT Frequency: Min 2X/week                             End of Session Equipment Utilized During Treatment: Gait belt;Rolling walker;Oxygen (4L continuous O2) Nurse Communication: Mobility status  Activity Tolerance: Patient tolerated treatment well Patient left: in chair;with call bell/phone within reach;with chair alarm set  OT Visit Diagnosis: Muscle weakness (generalized) (M62.81);Unsteadiness on feet (R26.81)                Time: 4709-6283 OT Time Calculation (min): 37 min Charges:  OT General Charges $OT Visit: 1 Procedure OT Evaluation $OT Eval Moderate Complexity: 1 Procedure OT Treatments $Self Care/Home Management : 8-22  mins G-Codes:     Lou Cal, OT Pager [phone] 01/18/2017   Raymondo Band 01/18/2017, 4:20 PM

## 2017-01-18 NOTE — Plan of Care (Signed)
Problem: Physical Regulation: Goal: Will remain free from infection Outcome: Progressing Pt admitted w/ PNA. On IV abx, sputum cx sent  Problem: Skin Integrity: Goal: Risk for impaired skin integrity will decrease Outcome: Progressing Using moisture barrier cream to MASD from incontinence  Problem: Tissue Perfusion: Goal: Risk factors for ineffective tissue perfusion will decrease Outcome: Progressing Pt on lovenox for VTE prophylaxis  Problem: Activity: Goal: Risk for activity intolerance will decrease Outcome: Progressing PT/OT consulted. Pt still significantly dyspneic even with bed mobility.

## 2017-01-18 NOTE — Progress Notes (Signed)
CRITICAL VALUE ALERT  Critical value received:  Lactic acid 2.7  Date of notification:  01/18/17  Time of notification:  0100  Critical value read back:Yes.    Nurse who received alert:  Virgina Norfolk  MD notified (1st page):  Baltazar Najjar  Time of first page:  0112  MD notified (2nd page):  Time of second page:  Responding MD:    Time MD responded:

## 2017-01-19 ENCOUNTER — Inpatient Hospital Stay (HOSPITAL_COMMUNITY): Payer: Medicare Other

## 2017-01-19 DIAGNOSIS — E784 Other hyperlipidemia: Secondary | ICD-10-CM

## 2017-01-19 DIAGNOSIS — I509 Heart failure, unspecified: Secondary | ICD-10-CM

## 2017-01-19 LAB — HEMOGLOBIN A1C
HEMOGLOBIN A1C: 6 % — AB (ref 4.8–5.6)
MEAN PLASMA GLUCOSE: 126 mg/dL

## 2017-01-19 LAB — BASIC METABOLIC PANEL
ANION GAP: 7 (ref 5–15)
BUN: 15 mg/dL (ref 6–20)
CALCIUM: 7.8 mg/dL — AB (ref 8.9–10.3)
CO2: 25 mmol/L (ref 22–32)
CREATININE: 0.73 mg/dL (ref 0.44–1.00)
Chloride: 103 mmol/L (ref 101–111)
Glucose, Bld: 113 mg/dL — ABNORMAL HIGH (ref 65–99)
Potassium: 3.2 mmol/L — ABNORMAL LOW (ref 3.5–5.1)
SODIUM: 135 mmol/L (ref 135–145)

## 2017-01-19 LAB — PROCALCITONIN: PROCALCITONIN: 0.11 ng/mL

## 2017-01-19 LAB — LEGIONELLA PNEUMOPHILA SEROGP 1 UR AG: L. pneumophila Serogp 1 Ur Ag: NEGATIVE

## 2017-01-19 LAB — MAGNESIUM: Magnesium: 1.8 mg/dL (ref 1.7–2.4)

## 2017-01-19 LAB — ECHOCARDIOGRAM COMPLETE
Height: 66 in
Weight: 2437.41 oz

## 2017-01-19 MED ORDER — POTASSIUM CHLORIDE CRYS ER 20 MEQ PO TBCR
40.0000 meq | EXTENDED_RELEASE_TABLET | Freq: Once | ORAL | Status: AC
  Administered 2017-01-19: 40 meq via ORAL
  Filled 2017-01-19: qty 2

## 2017-01-19 MED ORDER — ALBUTEROL SULFATE (2.5 MG/3ML) 0.083% IN NEBU: 2.5000 mg | INHALATION_SOLUTION | RESPIRATORY_TRACT | Status: DC | PRN

## 2017-01-19 MED ORDER — MAGNESIUM SULFATE 2 GM/50ML IV SOLN
2.0000 g | Freq: Once | INTRAVENOUS | Status: AC
  Administered 2017-01-19: 2 g via INTRAVENOUS
  Filled 2017-01-19: qty 50

## 2017-01-19 MED ORDER — SIMETHICONE 80 MG PO CHEW
160.0000 mg | CHEWABLE_TABLET | Freq: Once | ORAL | Status: AC
  Administered 2017-01-19: 160 mg via ORAL
  Filled 2017-01-19: qty 2

## 2017-01-19 MED ORDER — IPRATROPIUM-ALBUTEROL 0.5-2.5 (3) MG/3ML IN SOLN
3.0000 mL | Freq: Three times a day (TID) | RESPIRATORY_TRACT | Status: DC
  Administered 2017-01-19 – 2017-01-21 (×7): 3 mL via RESPIRATORY_TRACT
  Filled 2017-01-19 (×7): qty 3

## 2017-01-19 MED ORDER — GUAIFENESIN ER 600 MG PO TB12
600.0000 mg | ORAL_TABLET | Freq: Two times a day (BID) | ORAL | Status: DC
  Administered 2017-01-19 – 2017-01-21 (×4): 600 mg via ORAL
  Filled 2017-01-19 (×4): qty 1

## 2017-01-19 NOTE — Progress Notes (Signed)
PROGRESS NOTE  [name] WNU:[id number] DOB: 04-21-1922 DOA: 01/17/2017 PCP: Cathlean Cower, MD  Brief History:  83 year old female with history of hypertension, hyperlipidemia, stroke, complete AV block status post PPM, dementia, atrial fibrillation presented with 2-3 day history of increasing shortness of breath, chest congestion, and coughing. There was no fevers, chills, nausea, vomiting, diarrhea, hemoptysis, headache, neck pain. The patient resides at [facility], and she is normally able to ambulate with a Rollator, but requires some assistance with bathing and dressing. She went to see her primary care provider, and the patient was noted to have oxygen saturation of 82% on room air. As a result, the patient was sent to the emergency department for further evaluation. Chest x-ray showed increasing left lower lobe opacities. The patient was started on intravenous antibiotics.  Assessment/Plan: Acute respiratory failure with hypoxia -Secondary to pneumonia -still requires 4 L nasal cannula; dyspneic with minimal exertion -Pulmonary hygiene -Continue DuoNebs  Aspiration pneumonia/HCAP -speech therapy eval-->dysphagia 3 diet with thin liquids -d/c ceftriaxone 4/25 -continue zosyn -Personally reviewed chest x-ray--L>R basilar opacity -procalcitonin 0.11 -Influenza PCR negative -repeat CXR vs CT chest if no improvement in next 24 hours  Atrial fibrillation -CHADS-VASc = 6 -previously documented not to be AC candidate by Dr. Kathlyn Sacramento due to age, co-morbidities and high risk of bleeding -rate controlled -continue plavix  Complete Heart Block -follows Dr. Crissie Sickles -s/p PPM -01/19/2017 echo--EF 49 to 60%, no WMA, PSP 38, mild-mod Mitral stenosis, mild MR  History of stroke -PT eval--Home health PT;Supervision/Assistance - 24 hour -speech therapy eval appreciated  HTN -continue amlodipine and losartan -BP controlled  Dementia -continue  Aricept  Hypokalemia -replete -check mag--1.8  Hyperlipidemia -continue statin   Disposition Plan:   Home in 2-3 days  Family Communication:   daughter-in-law at bedside updated 4/26--Total time spent 35 minutes.  Greater than 50% spent face to face counseling and coordinating care.   Consultants:  none  Code Status:  FULL   DVT Prophylaxis:  [place] Lovenox   Procedures: As Listed in Progress Note Above  Antibiotics: Zosyn 4/25>>> Azithromycin 4/24>>> Ceftriaxone 4/24>>>4/25 Flagyl 4/24>>>4/25    Subjective: Patient states she is breathing low but better but remains dyspneic with exertion. Denies any fevers, chills, chest pain, abdominal pain, diarrhea, vomiting. Denies any headache, neck pain.  Objective: Vitals:   01/19/17 0455 01/19/17 0909 01/19/17 1007 01/19/17 1447  BP: 132/67  (!) 122/59 (!) 129/56  Pulse: 65  65 (!) 59  Resp: 18  20 19   Temp: 98.7 F (37.1 C)   98.6 F (37 C)  TempSrc: Oral   Oral  SpO2: 93% 94% 94% 94%  Weight: 69.1 kg (152 lb 5.4 oz)     Height:        Intake/Output Summary (Last 24 hours) at 01/19/17 1613 Last data filed at 01/19/17 1451  Gross per 24 hour  Intake             1420 ml  Output              200 ml  Net             1220 ml   Weight change: 0.1 kg (3.5 oz) Exam:   General:  Pt is alert, follows commands appropriately, not in acute distress  HEENT: No icterus, No thrush, No neck mass, [place]/AT  Cardiovascular: RRR, S1/S2, no rubs, no gallops  Respiratory: Bilateral scattered rhonchi. No wheezing.  Abdomen: Soft/+BS, non  tender, non distended, no guarding  Extremities: No edema, No lymphangitis, No petechiae, No rashes, no synovitis   Data Reviewed: I have personally reviewed following labs and imaging studies Basic Metabolic Panel:  Recent Labs Lab 01/17/17 1643 01/17/17 1758 01/18/17 0446 01/19/17 0554  NA 132* 132* 136 135  K 3.2* 3.2* 3.4* 3.2*  CL 96* 96* 103 103  CO2 23  --  24 25    GLUCOSE 150* 145* 106* 113*  BUN 18 17 15 15   CREATININE 0.81 0.70 0.71 0.73  CALCIUM 9.1  --  8.1* 7.8*  MG  --   --  1.9 1.8   Liver Function Tests:  Recent Labs Lab 01/17/17 1643  AST 50*  ALT 29  ALKPHOS 65  BILITOT 0.6  PROT 7.9  ALBUMIN 4.4   No results for input(s): LIPASE, AMYLASE in the last 168 hours. No results for input(s): AMMONIA in the last 168 hours. Coagulation Profile: No results for input(s): INR, PROTIME in the last 168 hours. CBC:  Recent Labs Lab 01/17/17 1643 01/17/17 1758 01/18/17 0446  WBC 8.2  --  6.6  NEUTROABS 6.8  --   --   HGB 14.4 15.3* 12.4  HCT 40.4 45.0 35.8*  MCV 87.4  --  88.8  PLT 222  --  192   Cardiac Enzymes:  Recent Labs Lab 01/17/17 2149 01/18/17 0446 01/18/17 0738  TROPONINI 0.04* 0.04* 0.04*   BNP: Invalid input(s): POCBNP CBG: No results for input(s): GLUCAP in the last 168 hours. HbA1C:  Recent Labs  01/18/17 0446  HGBA1C 6.0*   Urine analysis:    Component Value Date/Time   COLORURINE YELLOW 10/27/2016 [place] 10/27/2016 1224   LABSPEC 1.010 10/27/2016 1224   PHURINE 6.0 10/27/2016 1224   GLUCOSEU NEGATIVE 10/27/2016 1224   HGBUR NEGATIVE 10/27/2016 1224   BILIRUBINUR NEGATIVE 10/27/2016 1224   KETONESUR NEGATIVE 10/27/2016 1224   PROTEINUR NEGATIVE 01/11/2015 1000   UROBILINOGEN 0.2 10/27/2016 1224   NITRITE NEGATIVE 10/27/2016 1224   LEUKOCYTESUR NEGATIVE 10/27/2016 1224   Sepsis Labs: @LABRCNTIP (procalcitonin:4,lacticidven:4) ) Recent Results (from the past 240 hour(s))  Blood culture (routine x 2)     Status: None (Preliminary result)   Collection Time: 01/17/17  4:52 PM  Result Value Ref Range Status   Specimen Description BLOOD RIGHT ANTECUBITAL  Final   Special Requests   Final    BOTTLES DRAWN AEROBIC AND ANAEROBIC Blood Culture adequate volume   Culture   Final    NO GROWTH 2 DAYS Performed at [facility], [place] [address] [place], [place] [address]     Report Status PENDING  Incomplete  Blood culture (routine x 2)     Status: None (Preliminary result)   Collection Time: 01/17/17  4:57 PM  Result Value Ref Range Status   Specimen Description BLOOD RIGHT HAND  Final   Special Requests IN PEDIATRIC BOTTLE Blood Culture adequate volume  Final   Culture   Final    NO GROWTH 2 DAYS Performed at [facility], [place] [address], [place],  [address]    Report Status PENDING  Incomplete  MRSA PCR Screening     Status: None   Collection Time: 01/17/17 11:59 PM  Result Value Ref Range Status   MRSA by PCR NEGATIVE NEGATIVE Final    Comment:        The GeneXpert MRSA Assay (FDA approved for NASAL specimens only), is one component of a comprehensive MRSA  colonization surveillance program. It is not intended to diagnose MRSA infection nor to guide or monitor treatment for MRSA infections.   Culture, sputum-assessment     Status: None   Collection Time: 01/18/17  7:34 AM  Result Value Ref Range Status   Specimen Description SPUTUM  Final   Special Requests NONE  Final   Sputum evaluation THIS SPECIMEN IS ACCEPTABLE FOR SPUTUM CULTURE  Final   Report Status 01/18/2017 FINAL  Final  Culture, respiratory (NON-Expectorated)     Status: None (Preliminary result)   Collection Time: 01/18/17  7:34 AM  Result Value Ref Range Status   Specimen Description SPUTUM  Final   Special Requests NONE Reflexed from [id number]  Final   Gram Stain   Final    RARE WBC PRESENT, PREDOMINANTLY PMN FEW GRAM NEGATIVE RODS FEW GRAM NEGATIVE COCCOBACILLI RARE GRAM POSITIVE COCCI IN PAIRS    Culture   Final    CULTURE REINCUBATED FOR BETTER GROWTH Performed at [facility], [place] [address] [place], [place] [address]    Report Status PENDING  Incomplete     Scheduled Meds: . amLODipine  10 mg Oral Daily  . benzonatate  100 mg Oral TID  . calcium-vitamin D  1 tablet Oral QHS  . chlorhexidine  15 mL Mouth Rinse BID  . clopidogrel  75 mg Oral  Daily  . docusate sodium  100 mg Oral q morning - 10a  . donepezil  10 mg Oral Daily  . enoxaparin (LOVENOX) injection  40 mg Subcutaneous Q24H  . ipratropium-albuterol  3 mL Nebulization TID  . levothyroxine  125 mcg Oral QAC breakfast  . losartan  100 mg Oral Daily  . mouth rinse  15 mL Mouth Rinse q12n4p  . multivitamin with minerals  1 tablet Oral q morning - 10a  . polyvinyl alcohol  1 drop Both Eyes QID  . pravastatin  40 mg Oral Daily   Continuous Infusions: . azithromycin Stopped (01/18/17 2209)  . piperacillin-tazobactam 3.375 g (01/19/17 1408)    Procedures/Studies: Dg Chest 2 View  Result Date: 01/17/2017 CLINICAL DATA:  Cough.  Fever. EXAM: CHEST  2 VIEW COMPARISON:  10/10/2013 chest radiograph. FINDINGS: Stable configuration of 2 lead left subclavian pacemaker. Stable cardiomediastinal silhouette with aortic atherosclerosis and top-normal heart size. No pneumothorax. No pleural effusion. Patchy left lower lobe opacities. No overt pulmonary edema. IMPRESSION: Patchy left lower lobe opacities, which may represent aspiration or pneumonia. Recommend follow-up PA and lateral post treatment chest radiographs in 4-6 weeks. Aortic atherosclerosis. Electronically Signed   By: Ilona Sorrel M.D.   On: 01/17/2017 17:26    Deago Burruss, DO  [facility] Pager [phone]  If 7PM-7AM, please contact night-coverage [email or web address] Password [email or web address] 01/19/2017, 4:13 PM   LOS: 2 days

## 2017-01-19 NOTE — Progress Notes (Signed)
OT Cancellation Note  Patient Details Name: [name] MRN: [id number] DOB: 12-13-1921   Cancelled Treatment:    Reason Eval/Treat Not Completed: Other (comment); spoke with RN, RN requesting hold OT session until a later time as Pt having a rough afternoon and just returned to bed to rest. Will follow up at a later date.   Lou Cal, OT Pager [phone] 01/19/2017   Raymondo Band 01/19/2017, 3:25 PM

## 2017-01-19 NOTE — Progress Notes (Signed)
  Echocardiogram 2D Echocardiogram has been performed.  Donata Clay 01/19/2017, 3:00 PM

## 2017-01-19 NOTE — Procedures (Signed)
RN notified about pt's increased WOB.

## 2017-01-19 NOTE — Progress Notes (Signed)
  Speech Language Pathology Treatment: Dysphagia  Patient Details Name: JAYNEE WINTERS MRN: [id number] DOB: 1922/06/19 Today's Date: 01/19/2017 Time: 1749-4496 SLP Time Calculation (min) (ACUTE ONLY): 26 min  Assessment / Plan / Recommendation Clinical Impression  Pt sitting fully upright in chair with family present.  Lunch arrived during session - SLP observed pt consume lunch including egg salad, cucumbers and tea.  No indication of oropharyngeal dysphagia or airway compromise with po observed.  Swallow was overall timely with strong voice.  Per pt and family, pt's swallow ability and breathing is better.  No SLP follow up at this time as pt with functional swallow.  Educated pt/family to goals and need for pt to focus on swallowing when eating meals for maximal airway protection.    HPI HPI: 83 yo female adm to [facility] with breathing difficulty.  Pt is a resident of [facility] and is on a regular/thin diet prior to admission.  Family denies pt having pna since 2014 and report pt has a good appetite/intake. Pt CXR concerning for aspiration or pna- left lower lobe.  Pt has h/o aphasia from [facility] 2007 but denies dysphagia.  Pt also has h/o dementia, reflux, asp pna in 2014.  She has no prior SLP evaluations in the system.         SLP Plan  All goals met       Recommendations  Diet recommendations: Dysphagia 3 (mechanical soft);Thin liquid Medication Administration: Whole meds with puree Compensations: Slow rate;Small sips/bites                Oral Care Recommendations: Oral care BID SLP Visit Diagnosis: Dysphagia, unspecified (R13.10) Plan: All goals met       [place], [place] [facility] SLP [phone]

## 2017-01-19 NOTE — Progress Notes (Signed)
2050-  Patient alert. Very congested with increased RR in the 30's. HR paced in the 60's. O2 sat 94% on 4L/[place]. Tylene Fantasia NP paged and notified of increased congestion and RR.    2103- New orders acknowledged. Patient c/o nausea while holding her stomach. Continues to have congestion with moderate amount of thick tan secretions. Grimacing with cough. Oral suction. Medicated per MAR. Paced on tele HR 64. 94% on 4L/[place]. RR 28.   2330- patient resting quietly with eyes closed. Respirations even and unlabored. RR 22. O2 sat 94% on 4L/[place].  Paced on tele. HR 60's. Will continue to monitor.

## 2017-01-19 NOTE — Progress Notes (Signed)
Will continue to follow for discharge and [place] needs.

## 2017-01-20 DIAGNOSIS — J9600 Acute respiratory failure, unspecified whether with hypoxia or hypercapnia: Secondary | ICD-10-CM

## 2017-01-20 DIAGNOSIS — E038 Other specified hypothyroidism: Secondary | ICD-10-CM

## 2017-01-20 DIAGNOSIS — J181 Lobar pneumonia, unspecified organism: Secondary | ICD-10-CM

## 2017-01-20 LAB — CULTURE, RESPIRATORY W GRAM STAIN: Culture: NORMAL

## 2017-01-20 LAB — BASIC METABOLIC PANEL
Anion gap: 8 (ref 5–15)
BUN: 15 mg/dL (ref 6–20)
CO2: 26 mmol/L (ref 22–32)
Calcium: 7.6 mg/dL — ABNORMAL LOW (ref 8.9–10.3)
Chloride: 103 mmol/L (ref 101–111)
Creatinine, Ser: 0.75 mg/dL (ref 0.44–1.00)
GFR calc Af Amer: >60 mL/min (ref >60)
GLUCOSE: 100 mg/dL — AB (ref 65–99)
Potassium: 3.6 mmol/L (ref 3.5–5.1)
Sodium: 137 mmol/L (ref 135–145)

## 2017-01-20 LAB — MAGNESIUM: Magnesium: 2.2 mg/dL (ref 1.7–2.4)

## 2017-01-20 LAB — CBC
HCT: 36.8 % (ref 36.0–46.0)
Hemoglobin: 12.3 g/dL (ref 12.0–15.0)
MCH: 30.5 pg (ref 26.0–34.0)
MCHC: 33.4 g/dL (ref 30.0–36.0)
MCV: 91.3 fL (ref 78.0–100.0)
Platelets: 206 10*3/uL (ref 150–400)
RBC: 4.03 MIL/uL (ref 3.87–5.11)
RDW: 13.2 % (ref 11.5–15.5)
WBC: 5.5 10*3/uL (ref 4.0–10.5)

## 2017-01-20 MED ORDER — POTASSIUM CHLORIDE CRYS ER 20 MEQ PO TBCR
20.0000 meq | EXTENDED_RELEASE_TABLET | Freq: Once | ORAL | Status: AC
  Administered 2017-01-20: 20 meq via ORAL
  Filled 2017-01-20: qty 1

## 2017-01-20 MED ORDER — AZITHROMYCIN 250 MG PO TABS
500.0000 mg | ORAL_TABLET | ORAL | Status: AC
  Administered 2017-01-20 – 2017-01-21 (×2): 500 mg via ORAL
  Filled 2017-01-20 (×2): qty 2

## 2017-01-20 NOTE — Progress Notes (Signed)
Physical Therapy Treatment Patient Details Name: [name] MRN: [id number] DOB: [birth year] Today's Date: 01/20/2017    History of Present Illness  83 y.o. female with medical history significant of hypertension, hyperlipidemia, hypothyroidism, anxiety, TIA, stroke with mild right leg weakness and aphasia, complete AV block, s/p of pacemaker placement, dementia, a fib not on anticoagulants, who presents with shortness breath, cough and chest congestion. Dx of PNA, acute on chronic respiratory failure.     PT Comments    Significant progress with mobility today, pt ambulated 110' with RW, SaO2 90-93% on RA walking, no dyspnea, no loss of balance. Pt several times mentioned seeing "dead people" this morning, but upon questioning it seems she saw a news report about some murders on tv. RN/NT aware and will monitor.    Follow Up Recommendations  Home health PT;Supervision for mobility/OOB (HHPT at ALF, per social work note pt plans to return to ALF with assist from CNA)     Equipment Recommendations  None recommended by PT    Recommendations for Other Services OT consult     Precautions / Restrictions Precautions Precautions: Fall Precaution Comments: monitor O2 Restrictions Weight Bearing Restrictions: No    Mobility  Bed Mobility Overal bed mobility: Modified Independent             General bed mobility comments: with rail  Transfers Overall transfer level: Needs assistance   Transfers: Sit to/from Stand;Stand Pivot Transfers   Stand pivot transfers: Min assist       General transfer comment: min A to rise, VCs hand placement, sit to stand x 2, SPT to 3 in 1  Ambulation/Gait Ambulation/Gait assistance: Min assist Ambulation Distance (Feet): 110 Feet Assistive device: Rolling walker (2 wheeled) Gait Pattern/deviations: Step-through pattern;Decreased step length - right;Decreased step length - left   Gait velocity interpretation: at or above normal speed  for age/gender General Gait Details: steady with RW, SaO2 90-93% on RA, HR 80s, Min A to manage RW with turns (pt is used to swivel wheels on her rollator)   Stairs            Wheelchair Mobility    Modified Rankin (Stroke Patients Only)       Balance Overall balance assessment: Needs assistance   Sitting balance-[name] Scale: Good       Standing balance-[name] Scale: Fair Standing balance comment: relies on BUE support                            Cognition Arousal/Alertness: Awake/alert Behavior During Therapy: WFL for tasks assessed/performed Overall Cognitive Status: No family/caregiver present to determine baseline cognitive functioning                                 General Comments: Pt with hx of dementia, pt able to follow commands. Pt repeatedly mentioned being sad about seeing "dead people" this morning, when asked where she saw them she pointed to the tv and mentioned they were talking about "murdered people". Assuming pt saw something on the news rather than having hallucinations. RN/NT aware and will monitor.       Exercises      General Comments        Pertinent Vitals/Pain Pain Assessment: No/denies pain    Home Living  Prior Function            PT Goals (current goals can now be found in the care plan section) Acute Rehab PT Goals Patient Stated Goal: stop coughing, walk to dining room at ALF PT Goal Formulation: With patient Time For Goal Achievement: 02/01/17 Potential to Achieve Goals: Good Progress towards PT goals: Progressing toward goals    Frequency    Min 3X/week      PT Plan Current plan remains appropriate    Co-evaluation             End of Session Equipment Utilized During Treatment: Gait belt;Oxygen Activity Tolerance: Patient tolerated treatment well Patient left: in chair;with call bell/phone within reach;with chair alarm set Nurse Communication:  Mobility status PT Visit Diagnosis: Difficulty in walking, not elsewhere classified (R26.2)     Time: 0802-2336 PT Time Calculation (min) (ACUTE ONLY): 34 min  Charges:  $Gait Training: 8-22 mins $Therapeutic Activity: 8-22 mins                    G Codes:          Philomena Doheny 01/20/2017, 10:41 AM  [phone]

## 2017-01-20 NOTE — Progress Notes (Addendum)
PROGRESS NOTE  [name] ZOX:[id number] DOB: June 06, 1922 DOA: 01/17/2017 PCP: Cathlean Cower, MD  Brief History: 83 year old female with history of hypertension, hyperlipidemia, stroke, complete AV block status post PPM, dementia, atrial fibrillation presented with 2-3 day history of increasing shortness of breath, chest congestion, and coughing. There was no fevers, chills, nausea, vomiting, diarrhea, hemoptysis, headache, neck pain. The patient resides at [facility], and she is normally able to ambulate with a Rollator, but requires some assistance with bathing and dressing.She went to see her primary care provider, and the patient was noted to have oxygen saturation of 82% on room air. As a result, the patient was sent to the emergency department for further evaluation. Chest x-ray showed increasing left lower lobe opacities. The patient was started on intravenous antibiotics.  Assessment/Plan: Acute respiratory failure with hypoxia -Secondary to pneumonia -slow improvement, but showing improvement for first time 4/27 -wean oxygen to RA -Pulmonary hygiene -Continue DuoNebs -flutter valve -OOB with meals  Aspiration pneumonia/HCAP -speech therapy eval-->dysphagia 3 diet with thin liquids -d/c ceftriaxone 4/25 -continue zosyn -4/27 Personally reviewed chest x-ray--L>R basilar opacity; mild interstitial prominence -procalcitonin 0.11 -Influenza PCR negative -01/20/17--able to ambulate with PT without desaturation--remained 93% on RA -continue mucinex and tessalon  Paroxysmal Atrial fibrillation -CHADS-VASc = 6 -previously documented not to be AC candidate by Dr. Kathlyn Sacramento due to age, co-morbidities, and high risk of bleeding -rate controlled -continue plavix  Complete Heart Block -follows Dr. Crissie Sickles -s/p PPM -01/19/2017 echo--EF 55 to 60%, no WMA, PSP 38, mild-mod Mitral stenosis, mild MR  History of stroke -PT eval--Home health  PT;Supervision/Assistance - 24 hour -speech therapy eval appreciated  HTN -continue amlodipine and losartan -BP controlled  Dementia -continue Aricept  Hypokalemia -replete -check mag--1.8  Hyperlipidemia -continue statin   Disposition Plan: [facility] on 01/21/17 if stable  Family Communication: No family present  Consultants: none  Code Status: FULL   DVT Prophylaxis: [place] Lovenox   Procedures: As Listed in Progress Note Above  Antibiotics: Zosyn 4/25>>> Azithromycin 4/24>>> Ceftriaxone 4/24>>>4/25 Flagyl 4/24>>>4/25   Subjective: Patient denies fevers, chills, headache, chest pain,  nausea, vomiting, diarrhea, abdominal pain, dysuria, hematuria, hematochezia, and melena. Mild dyspnea with exertion. Continues to cough with white sputum   Objective: Vitals:   01/20/17 0646 01/20/17 0752 01/20/17 0931 01/20/17 1032  BP: (!) 148/55  131/68   Pulse: 63     Resp: 20     Temp: 98.1 F (36.7 C)     TempSrc: Oral     SpO2: 93% 96%  92%  Weight:      Height:        Intake/Output Summary (Last 24 hours) at 01/20/17 1044 Last data filed at 01/20/17 0912  Gross per 24 hour  Intake              880 ml  Output                0 ml  Net              880 ml   Weight change:  Exam:   General:  Pt is alert, follows commands appropriately, not in acute distress  HEENT: No icterus, No thrush, No neck mass, [place]/AT  Cardiovascular: RRR, S1/S2, no rubs, no gallops, no JVD  Respiratory: bibasilar rales L>R, minimal basilar wheeze.  Good air movement  Abdomen: Soft/+BS, non tender, non distended, no guarding  Extremities: No edema, No lymphangitis, No petechiae, No  rashes, no synovitis   Data Reviewed: I have personally reviewed following labs and imaging studies Basic Metabolic Panel:  Recent Labs Lab 01/17/17 1643 01/17/17 1758 01/18/17 0446 01/19/17 0554 01/20/17 0514  NA 132* 132* 136 135 137  K 3.2* 3.2* 3.4* 3.2* 3.6  CL 96*  96* 103 103 103  CO2 23  --  24 25 26   GLUCOSE 150* 145* 106* 113* 100*  BUN 18 17 15 15 15   CREATININE 0.81 0.70 0.71 0.73 0.75  CALCIUM 9.1  --  8.1* 7.8* 7.6*  MG  --   --  1.9 1.8 2.2   Liver Function Tests:  Recent Labs Lab 01/17/17 1643  AST 50*  ALT 29  ALKPHOS 65  BILITOT 0.6  PROT 7.9  ALBUMIN 4.4   No results for input(s): LIPASE, AMYLASE in the last 168 hours. No results for input(s): AMMONIA in the last 168 hours. Coagulation Profile: No results for input(s): INR, PROTIME in the last 168 hours. CBC:  Recent Labs Lab 01/17/17 1643 01/17/17 1758 01/18/17 0446 01/20/17 0514  WBC 8.2  --  6.6 5.5  NEUTROABS 6.8  --   --   --   HGB 14.4 15.3* 12.4 12.3  HCT 40.4 45.0 35.8* 36.8  MCV 87.4  --  88.8 91.3  PLT 222  --  192 206   Cardiac Enzymes:  Recent Labs Lab 01/17/17 2149 01/18/17 0446 01/18/17 0738  TROPONINI 0.04* 0.04* 0.04*   BNP: Invalid input(s): POCBNP CBG: No results for input(s): GLUCAP in the last 168 hours. HbA1C:  Recent Labs  01/18/17 0446  HGBA1C 6.0*   Urine analysis:    Component Value Date/Time   COLORURINE YELLOW 10/27/2016 [place] 10/27/2016 1224   LABSPEC 1.010 10/27/2016 1224   PHURINE 6.0 10/27/2016 1224   GLUCOSEU NEGATIVE 10/27/2016 1224   HGBUR NEGATIVE 10/27/2016 1224   BILIRUBINUR NEGATIVE 10/27/2016 1224   KETONESUR NEGATIVE 10/27/2016 1224   PROTEINUR NEGATIVE 01/11/2015 1000   UROBILINOGEN 0.2 10/27/2016 1224   NITRITE NEGATIVE 10/27/2016 1224   LEUKOCYTESUR NEGATIVE 10/27/2016 1224   Sepsis Labs: @LABRCNTIP (procalcitonin:4,lacticidven:4) ) Recent Results (from the past 240 hour(s))  Blood culture (routine x 2)     Status: None (Preliminary result)   Collection Time: 01/17/17  4:52 PM  Result Value Ref Range Status   Specimen Description BLOOD RIGHT ANTECUBITAL  Final   Special Requests   Final    BOTTLES DRAWN AEROBIC AND ANAEROBIC Blood Culture adequate volume   Culture    Final    NO GROWTH 2 DAYS Performed at [facility], [place] [address] [place], [place] [address]    Report Status PENDING  Incomplete  Blood culture (routine x 2)     Status: None (Preliminary result)   Collection Time: 01/17/17  4:57 PM  Result Value Ref Range Status   Specimen Description BLOOD RIGHT HAND  Final   Special Requests IN PEDIATRIC BOTTLE Blood Culture adequate volume  Final   Culture   Final    NO GROWTH 2 DAYS Performed at [facility], [place] [address] [place], [place] [address]    Report Status PENDING  Incomplete  MRSA PCR Screening     Status: None   Collection Time: 01/17/17 11:59 PM  Result Value Ref Range Status   MRSA by PCR NEGATIVE NEGATIVE Final    Comment:        The GeneXpert MRSA Assay (FDA approved for NASAL specimens only), is  one component of a comprehensive MRSA colonization surveillance program. It is not intended to diagnose MRSA infection nor to guide or monitor treatment for MRSA infections.   Culture, sputum-assessment     Status: None   Collection Time: 01/18/17  7:34 AM  Result Value Ref Range Status   Specimen Description SPUTUM  Final   Special Requests NONE  Final   Sputum evaluation THIS SPECIMEN IS ACCEPTABLE FOR SPUTUM CULTURE  Final   Report Status 01/18/2017 FINAL  Final  Culture, respiratory (NON-Expectorated)     Status: None   Collection Time: 01/18/17  7:34 AM  Result Value Ref Range Status   Specimen Description SPUTUM  Final   Special Requests NONE Reflexed from [id number]  Final   Gram Stain   Final    RARE WBC PRESENT, PREDOMINANTLY PMN FEW GRAM NEGATIVE RODS FEW GRAM NEGATIVE COCCOBACILLI RARE GRAM POSITIVE COCCI IN PAIRS    Culture   Final    Consistent with normal respiratory flora. Performed at [facility], [place] [address] [place],  [address]    Report Status 01/20/2017 FINAL  Final     Scheduled Meds: . amLODipine  10 mg Oral Daily  . benzonatate  100 mg Oral TID  .  calcium-vitamin D  1 tablet Oral QHS  . chlorhexidine  15 mL Mouth Rinse BID  . clopidogrel  75 mg Oral Daily  . docusate sodium  100 mg Oral q morning - 10a  . donepezil  10 mg Oral Daily  . enoxaparin (LOVENOX) injection  40 mg Subcutaneous Q24H  . guaiFENesin  600 mg Oral BID  . ipratropium-albuterol  3 mL Nebulization TID  . levothyroxine  125 mcg Oral QAC breakfast  . losartan  100 mg Oral Daily  . mouth rinse  15 mL Mouth Rinse q12n4p  . multivitamin with minerals  1 tablet Oral q morning - 10a  . polyvinyl alcohol  1 drop Both Eyes QID  . pravastatin  40 mg Oral Daily   Continuous Infusions: . azithromycin Stopped (01/19/17 2156)  . piperacillin-tazobactam 3.375 g (01/20/17 0630)    Procedures/Studies: Dg Chest 2 View  Result Date: 01/17/2017 CLINICAL DATA:  Cough.  Fever. EXAM: CHEST  2 VIEW COMPARISON:  10/10/2013 chest radiograph. FINDINGS: Stable configuration of 2 lead left subclavian pacemaker. Stable cardiomediastinal silhouette with aortic atherosclerosis and top-normal heart size. No pneumothorax. No pleural effusion. Patchy left lower lobe opacities. No overt pulmonary edema. IMPRESSION: Patchy left lower lobe opacities, which may represent aspiration or pneumonia. Recommend follow-up PA and lateral post treatment chest radiographs in 4-6 weeks. Aortic atherosclerosis. Electronically Signed   By: Ilona Sorrel M.D.   On: 01/17/2017 17:26   Dg Chest Port 1 View  Result Date: 01/19/2017 CLINICAL DATA:  Productive cough.  Shortness of breath. Tachypnea. EXAM: PORTABLE CHEST 1 VIEW COMPARISON:  Chest radiograph January 17, 2017 FINDINGS: Cardiac silhouette is mildly enlarged unchanged. Calcified aortic knob. Diffuse mild interstitial prominence with similar patchy LEFT lung base airspace opacity. Small LEFT pleural effusion. Dual lead LEFT cardiac pacemaker in situ. Mild apical pleural thickening. No pneumothorax. Osteopenia. IMPRESSION: Stable mild cardiomegaly and interstitial  prominence. Persistent LEFT lung base suspected pneumonia with small LEFT pleural effusion. Followup PA and lateral chest X-ray is recommended in 3-4 weeks following trial of antibiotic therapy to ensure resolution and exclude underlying malignancy. Electronically Signed   By: Elon Alas M.D.   On: 01/19/2017 21:45    Ellamay Fors, DO  [facility] Pager  [phone]  If 7PM-7AM, please contact night-coverage [email or web address] Password [email or web address] 01/20/2017, 10:44 AM   LOS: 3 days

## 2017-01-20 NOTE — Progress Notes (Signed)
Occupational Therapy Treatment Patient Details Name: [name] MRN: [id number] DOB: [birth year] Today's Date: 01/20/2017    History of present illness  83 y.o. female with medical history significant of hypertension, hyperlipidemia, hypothyroidism, anxiety, TIA, stroke with mild right leg weakness and aphasia, complete AV block, s/p of pacemaker placement, dementia, a fib not on anticoagulants, who presents with shortness breath, cough and chest congestion. Dx of PNA, acute on chronic respiratory failure.    OT comments  Pt progressing towards goals, increased mobility and activity tolerance this session. Pt completed room level functional mobility on RA with O2 sats remaining 90-93%. Pt will continue to benefit from OT services to increase endurance, safety, and independence with ADLs and functional mobility. Goals updated to reflect Pt progress. Continue per POC.   Follow Up Recommendations  Home health OT;Supervision/Assistance - 24 hour    Equipment Recommendations  3 in 1 bedside commode (if Pt does not currently have)          Precautions / Restrictions Precautions Precautions: Fall Precaution Comments: monitor O2 Restrictions Weight Bearing Restrictions: No       Mobility Bed Mobility Overal bed mobility: Modified Independent             General bed mobility comments: OOB in recliner  Transfers Overall transfer level: Needs assistance Equipment used: Rolling walker (2 wheeled) Transfers: Sit to/from Stand Sit to Stand: Min assist Stand pivot transfers: Min assist       General transfer comment: increased time, verbal cues for hand placement, assist to rise    Balance Overall balance assessment: Needs assistance Sitting-balance support: Feet supported;Bilateral upper extremity supported Sitting balance-[name] Scale: Good     Standing balance support: Bilateral upper extremity supported Standing balance-[name] Scale: Fair Standing balance comment:  relies on BUE support                           ADL either performed or assessed with clinical judgement   ADL Overall ADL's : Needs assistance/impaired Eating/Feeding: Set up;Sitting   Grooming: Wash/dry face;Oral care;Wash/dry hands;Set up;Sitting               Lower Body Dressing: Sit to/from stand;Maximal assistance   Toilet Transfer: Minimal assistance;Ambulation;BSC;RW Toilet Transfer Details (indicate cue type and reason): BSC over toilet Toileting- Clothing Manipulation and Hygiene: Maximal assistance;Sit to/from stand Toileting - Clothing Manipulation Details (indicate cue type and reason): for hygiene, clothing management     Functional mobility during ADLs: Minimal assistance;Rolling walker General ADL Comments: Completed room level functional mobility, toilet transfer, toileting, and seated ADLs; Pt on RA during functional mobility and toileting with O2 sats remaining 90-93%                       Cognition Arousal/Alertness: Awake/alert Behavior During Therapy: WFL for tasks assessed/performed Overall Cognitive Status: No family/caregiver present to determine baseline cognitive functioning                                 General Comments: hx of dementia, able to follow commands                    General Comments      Pertinent Vitals/ Pain       Pain Assessment: No/denies pain  Frequency  Min 2X/week        Progress Toward Goals  OT Goals(current goals can now be found in the care plan section)  Progress towards OT goals: Progressing toward goals;Goals met and updated - see care plan  Acute Rehab OT Goals Patient Stated Goal: stop coughing, walk to dining room at ALF OT Goal Formulation: With patient Time For Goal Achievement: 01/25/17 Potential to Achieve Goals: Good ADL Goals Pt Will Perform Grooming: with min guard  assist;standing Pt Will Transfer to Toilet: with min guard assist;ambulating;bedside commode (BSC over toilet) Pt Will Perform Toileting - Clothing Manipulation and hygiene: with min assist;sit to/from stand Additional ADL Goal #1: Pt will demonstrate at least one energy conservation technique during ADL task completion with Min verbal cues.  Plan Discharge plan remains appropriate                     End of Session Equipment Utilized During Treatment: Gait belt;Rolling walker;Oxygen (1L O2 end of session)  OT Visit Diagnosis: Muscle weakness (generalized) (M62.81);Unsteadiness on feet (R26.81)   Activity Tolerance Patient tolerated treatment well   Patient Left in chair;with call bell/phone within reach;with chair alarm set;with family/visitor present (daughter in law present at end of session)   Nurse Communication Mobility status        Time: 1204-1238 OT Time Calculation (min): 34 min  Charges: OT General Charges $OT Visit: 1 Procedure OT Treatments $Self Care/Home Management : 23-37 mins   , OT Pager [phone] 01/20/2017     L  01/20/2017, 1:56 PM    

## 2017-01-21 LAB — PROCALCITONIN: Procalcitonin: 0.1 ng/mL

## 2017-01-21 MED ORDER — AMOXICILLIN-POT CLAVULANATE 875-125 MG PO TABS: 1.0000 | ORAL_TABLET | Freq: Two times a day (BID) | ORAL | 0 refills | Status: DC

## 2017-01-21 MED ORDER — AMOXICILLIN-POT CLAVULANATE 875-125 MG PO TABS
1.0000 | ORAL_TABLET | Freq: Two times a day (BID) | ORAL | Status: DC
  Administered 2017-01-21: 1 via ORAL
  Filled 2017-01-21: qty 1

## 2017-01-21 MED ORDER — BENZONATATE 100 MG PO CAPS
100.0000 mg | ORAL_CAPSULE | Freq: Three times a day (TID) | ORAL | 0 refills | Status: DC
Start: 2017-01-21 — End: 2017-02-03

## 2017-01-21 NOTE — Progress Notes (Signed)
CM spoke with son who's mother, the patient, lives at [facility].  [facility] has their own internal PT/OT department and this CM will fax over the HHPT/OT order.  No other CM needs were communicated.

## 2017-01-21 NOTE — NC FL2 (Signed)
[place] MEDICAID FL2 LEVEL OF CARE SCREENING TOOL     IDENTIFICATION  Patient Name: [name] Birthdate: Feb 08, 1922 Sex: female Admission Date (Current Location): 01/17/2017  [facility] and [place] Number:  Herbalist and Address:  [facility],  [place] [address] [place]      Provider Number: [id number]  Attending Physician Name and Address:  Orson Eva, MD  Relative Name and Phone Number:       Current Level of Care: Hospital Recommended Level of Care: [place] Prior Approval Number:    Date Approved/Denied:   PASRR Number:    Discharge Plan: Domiciliary (Rest home)    Current Diagnoses: Patient Active Problem List   Diagnosis Date Noted  . Lobar pneumonia ([place]) 01/20/2017  . Acute on chronic respiratory failure with hypoxia ([place]) 01/17/2017  . Acute respiratory failure with hypoxia ([place]) 01/17/2017  . DOE (dyspnea on exertion) 10/23/2014  . Hyponatremia 10/23/2014  . RLL pneumonia ([place]) 10/11/2013  . Other specified anemias 10/10/2013  . Acute respiratory failure ([place]) 08/15/2013  . Aspiration pneumonia ([place]) 08/15/2013  . Altered mental status 08/15/2013  . Incarcerated femoral hernia 08/14/2013  . Esophageal reflux 08/05/2013  . Unspecified constipation 07/03/2013  . Closed fracture of left hip ([place]) 07/01/2013  . 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 Atrial fibrillation ([place]) 10/30/2012  . Low back pain 10/26/2012  . Syncope 10/26/2012  . PNA (pneumonia) 09/26/2012  . UTI (lower urinary tract infection) 09/26/2012  . Hypokalemia 09/26/2012  . Leukocytosis 09/26/2012  . Cardiac enzymes elevated 07/25/2011  . Impaired glucose tolerance 06/03/2011  . Preventative health care 06/03/2011  . IRON DEFICIENCY 12/06/2010  . 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 CVA (cerebral vascular accident) ([place]) 01/09/2009  . Aphasia 01/09/2009  . Urinary incontinence 01/09/2009  . PACEMAKER,  PERMANENT 01/09/2009  . Dementia 05/19/2008  . Hypothyroidism 11/20/2007  . Hyperlipidemia 11/20/2007  . Essential hypertension 11/20/2007  . DIVERTICULOSIS, COLON 11/20/2007  . FATIGUE 11/20/2007  . CEREBROVASCULAR ACCIDENT, HX OF 11/20/2007  . TIA 05/14/2003    Orientation RESPIRATION BLADDER Height & Weight     Self, Place  Normal Incontinent Weight: 152 lb 8.9 oz (69.2 kg) Height:  5\' 6"  (167.6 cm)  BEHAVIORAL SYMPTOMS/MOOD NEUROLOGICAL BOWEL NUTRITION STATUS      Incontinent Diet (Heart Healthy)  AMBULATORY STATUS COMMUNICATION OF NEEDS Skin   Limited Assist Verbally Normal                       Personal Care Assistance Level of Assistance  Bathing, Dressing Bathing Assistance: Limited assistance   Dressing Assistance: Limited assistance     Functional Limitations Info  Speech     Speech Info: Impaired (Slurred)    SPECIAL CARE FACTORS FREQUENCY  PT (By licensed PT) (By [place])     PT Frequency: Per Evaluation              Contractures      Additional Factors Info  Code Status, Allergies Code Status Info: Full Allergies Info: Codeine           Current Medications (01/21/2017):  This is the current hospital active medication list Current Facility-Administered Medications  Medication Dose Route Frequency Provider Last Rate Last Dose  . albuterol (PROVENTIL) (2.5 MG/3ML) 0.083% nebulizer solution 2.5 mg  2.5 mg Nebulization Q4H PRN Gardiner Barefoot, NP      . ALPRAZolam Duanne Moron) tablet 0.25 mg  0.25 mg  Oral BID PRN Ivor Costa, MD   0.25 mg at 01/19/17 2111  . amLODipine (NORVASC) tablet 10 mg  10 mg Oral Daily Ivor Costa, MD   10 mg at 01/21/17 1111  . amoxicillin-clavulanate (AUGMENTIN) 875-125 MG per tablet 1 tablet  1 tablet Oral Q12H Orson Eva, MD   1 tablet at 01/21/17 1137  . azithromycin (ZITHROMAX) tablet 500 mg  500 mg Oral Q24H Orson Eva, MD   500 mg at 01/20/17 1756  . benzonatate (TESSALON) capsule 100 mg  100 mg Oral TID Orson Eva, MD   100 mg at 01/21/17 1111  . calcium-vitamin D (OSCAL WITH D) 500-200 MG-UNIT per tablet 1 tablet  1 tablet Oral QHS Ivor Costa, MD   1 tablet at 01/20/17 2105  . chlorhexidine (PERIDEX) 0.12 % solution 15 mL  15 mL Mouth Rinse BID Orson Eva, MD   15 mL at 01/21/17 1109  . clopidogrel (PLAVIX) tablet 75 mg  75 mg Oral Daily Ivor Costa, MD   75 mg at 01/21/17 1111  . docusate sodium (COLACE) capsule 100 mg  100 mg Oral q morning - 10a Ivor Costa, MD   100 mg at 01/21/17 1111  . donepezil (ARICEPT) tablet 10 mg  10 mg Oral Daily Ivor Costa, MD   10 mg at 01/21/17 1110  . enoxaparin (LOVENOX) injection 40 mg  40 mg Subcutaneous Q24H Ivor Costa, MD   40 mg at 01/20/17 2105  . guaiFENesin (MUCINEX) 12 hr tablet 600 mg  600 mg Oral BID Gardiner Barefoot, NP   600 mg at 01/21/17 1111  . ipratropium-albuterol (DUONEB) 0.5-2.5 (3) MG/3ML nebulizer solution 3 mL  3 mL Nebulization TID Orson Eva, MD   3 mL at 01/21/17 1354  . levothyroxine (SYNTHROID, LEVOTHROID) tablet 125 mcg  125 mcg Oral QAC breakfast Ivor Costa, MD   125 mcg at 01/21/17 [phone]  . losartan (COZAAR) tablet 100 mg  100 mg Oral Daily Ivor Costa, MD   100 mg at 01/21/17 1110  . MEDLINE mouth rinse  15 mL Mouth Rinse q12n4p Orson Eva, MD   15 mL at 01/21/17 1200  . multivitamin with minerals tablet 1 tablet  1 tablet Oral q morning - 10a Ivor Costa, MD   1 tablet at 01/21/17 1111  . ondansetron (ZOFRAN-ODT) disintegrating tablet 4 mg  4 mg Oral Q8H PRN Gardiner Barefoot, NP   4 mg at 01/19/17 2111  . polyethylene glycol (MIRALAX / GLYCOLAX) packet 17 g  17 g Oral Daily PRN Ivor Costa, MD   17 g at 01/18/17 1757  . polyvinyl alcohol (LIQUIFILM TEARS) 1.4 % ophthalmic solution 1 drop  1 drop Both Eyes QID Ivor Costa, MD   1 drop at 01/21/17 1445  . pravastatin (PRAVACHOL) tablet 40 mg  40 mg Oral Daily Ivor Costa, MD   40 mg at 01/21/17 1110     Discharge Medications:  Medication List    TAKE these medications   acetaminophen 325 MG  tablet Commonly known as:  TYLENOL Take 650 mg by mouth every 6 (six) hours as needed (ARTHRITIS PAIN IN HANDS).   ALPRAZolam 0.25 MG tablet Commonly known as:  XANAX TAKE 1/2 -1 TABLET BY MOUTH 2 TIMES DAILY AS NEEDED   amLODipine 10 MG tablet Commonly known as:  NORVASC Take 1 tablet (10 mg total) by mouth daily. Resume in 3 days if SBP > 150   amoxicillin-clavulanate 875-125 MG tablet Commonly known as:  AUGMENTIN Take 1  tablet by mouth every 12 (twelve) hours.   ARTIFICIAL TEARS 1.4 % ophthalmic solution Generic drug:  polyvinyl alcohol Place 1 drop into both eyes 4 (four) times daily.   benzonatate 100 MG capsule Commonly known as:  TESSALON Take 1 capsule (100 mg total) by mouth 3 (three) times daily.   CALTRATE 600+D 600-400 MG-UNIT tablet Generic drug:  Calcium Carbonate-Vitamin D Take 1 tablet by mouth at bedtime.   clopidogrel 75 MG tablet Commonly known as:  PLAVIX Take 1 tablet (75 mg total) by mouth daily.   donepezil 10 MG tablet Commonly known as:  ARICEPT Take 1 tablet (10 mg total) by mouth daily.   DSS 100 MG Caps Take 100 mg by mouth every morning.   feeding supplement (ENSURE COMPLETE) Liqd Take 237 mLs by mouth 2 (two) times daily between meals.   levothyroxine 125 MCG tablet Commonly known as:  SYNTHROID, LEVOTHROID Take 1 tablet (125 mcg total) by mouth daily.   losartan 100 MG tablet Commonly known as:  COZAAR Take 1 tablet (100 mg total) by mouth daily.   multivitamin with minerals Tabs tablet Take 1 tablet by mouth every morning. Centrum silver   polyethylene glycol packet Commonly known as:  MIRALAX / GLYCOLAX Take 17 g by mouth daily as needed for mild constipation.   pravastatin 40 MG tablet Commonly known as:  PRAVACHOL Take 1 tablet (40 mg total) by mouth daily.           Relevant Imaging Results:  Relevant Lab Results:   Additional Information SSN:   [place], Lorie [place], [place]

## 2017-01-21 NOTE — Discharge Summary (Signed)
Physician Discharge Summary  ROBIN PETRAKIS DXA:[id number] DOB: [birth year] DOA: 01/17/2017  PCP: Cathlean Cower, MD  Admit date: 01/17/2017 Discharge date: 01/21/2017  Admitted From:[facility] Disposition:  [facility]  Recommendations for Outpatient Follow-up:  1. Follow up with PCP in 1-2 weeks 2. Please obtain BMP/CBC in one week   Home Health: YES Equipment/Devices: PT/OT  Discharge Condition: Stable CODE STATUS: FULL Diet recommendation: Heart Healthy    Brief/Interim Summary: 83 year old female with history of hypertension, hyperlipidemia, stroke, complete AV block status post PPM, dementia, atrial fibrillation presented with 2-3 day history of increasing shortness of breath, chest congestion, and coughing. There was no fevers, chills, nausea, vomiting, diarrhea, hemoptysis, headache, neck pain. The patient resides at [facility], and she is normally able to ambulate with a Rollator, but requires some assistance with bathing and dressing.She went to see her primary care provider, and the patient was noted to have oxygen saturation of 82% on room air. As a result, the patient was sent to the emergency department for further evaluation. Chest x-ray showed increasing left lower lobe opacities. The patient was started on intravenous antibiotics.  Discharge Diagnoses:  Acute respiratory failure with hypoxia -Secondary to pneumonia -slow improvement, but showing improvement for first time 4/27 -wean oxygen to RA--95% -ambulated on RA without desaturation prior to discharge -Pulmonary hygiene -Continue DuoNebs during hospitalization -flutter valve -OOB with meals  Aspiration pneumonia/HCAP -speech therapy eval-->dysphagia 3 diet with thin liquids -d/c ceftriaxone 4/25 -continuezosyn -completed 5 days azithromycin prior to d/c -home with 3 more days Amox/clav to finish 7 days of therapy total -4/27 Personally reviewed chest x-ray--L>Rbasilar opacity; mild interstitial  prominence -procalcitonin 0.11 -Influenza PCR negative -01/20/17--able to ambulate with PT without desaturation--remained 93% on RA -continue mucinex and tessalon  Paroxysmal Atrial fibrillation -CHADS-VASc = 6 -previously documented not to be AC candidate by Dr. Kathlyn Sacramento due to age, co-morbidities, and high risk of bleeding -rate controlled -continue plavix  Complete Heart Block -follows Dr. Crissie Sickles -s/p PPM -01/19/2017 echo--EF 55to 60%, no WMA, PSP 38, mild-mod Mitral stenosis, mild MR  History ofstroke -PT eval--Home health PT;Supervision/Assistance - 24 hour -speech therapy eval appreciated  HTN -continue amlodipine and losartan -BP controlled  Dementia -continue Aricept  Hypokalemia -replete -check mag--1.8  Hyperlipidemia -continue statin   Discharge Instructions  Discharge Instructions    Diet - low sodium heart healthy    Complete by:  As directed    Increase activity slowly    Complete by:  As directed      Allergies as of 01/21/2017      Reactions   Codeine    unknown      Medication List    TAKE these medications   acetaminophen 325 MG tablet Commonly known as:  TYLENOL Take 650 mg by mouth every 6 (six) hours as needed (ARTHRITIS PAIN IN HANDS).   ALPRAZolam 0.25 MG tablet Commonly known as:  XANAX TAKE 1/2 -1 TABLET BY MOUTH 2 TIMES DAILY AS NEEDED   amLODipine 10 MG tablet Commonly known as:  NORVASC Take 1 tablet (10 mg total) by mouth daily. Resume in 3 days if SBP > 150   amoxicillin-clavulanate 875-125 MG tablet Commonly known as:  AUGMENTIN Take 1 tablet by mouth every 12 (twelve) hours.   ARTIFICIAL TEARS 1.4 % ophthalmic solution Generic drug:  polyvinyl alcohol Place 1 drop into both eyes 4 (four) times daily.   benzonatate 100 MG capsule Commonly known as:  TESSALON Take 1 capsule (100 mg total) by mouth 3 (three) times  daily.   CALTRATE 600+D 600-400 MG-UNIT tablet Generic drug:  Calcium  Carbonate-Vitamin D Take 1 tablet by mouth at bedtime.   clopidogrel 75 MG tablet Commonly known as:  PLAVIX Take 1 tablet (75 mg total) by mouth daily.   donepezil 10 MG tablet Commonly known as:  ARICEPT Take 1 tablet (10 mg total) by mouth daily.   DSS 100 MG Caps Take 100 mg by mouth every morning.   feeding supplement (ENSURE COMPLETE) Liqd Take 237 mLs by mouth 2 (two) times daily between meals.   levothyroxine 125 MCG tablet Commonly known as:  SYNTHROID, LEVOTHROID Take 1 tablet (125 mcg total) by mouth daily.   losartan 100 MG tablet Commonly known as:  COZAAR Take 1 tablet (100 mg total) by mouth daily.   multivitamin with minerals Tabs tablet Take 1 tablet by mouth every morning. Centrum silver   polyethylene glycol packet Commonly known as:  MIRALAX / GLYCOLAX Take 17 g by mouth daily as needed for mild constipation.   pravastatin 40 MG tablet Commonly known as:  PRAVACHOL Take 1 tablet (40 mg total) by mouth daily.       Allergies  Allergen Reactions  . Codeine     unknown    Consultations:  none   Procedures/Studies: Dg Chest 2 View  Result Date: 01/17/2017 CLINICAL DATA:  Cough.  Fever. EXAM: CHEST  2 VIEW COMPARISON:  10/10/2013 chest radiograph. FINDINGS: Stable configuration of 2 lead left subclavian pacemaker. Stable cardiomediastinal silhouette with aortic atherosclerosis and top-normal heart size. No pneumothorax. No pleural effusion. Patchy left lower lobe opacities. No overt pulmonary edema. IMPRESSION: Patchy left lower lobe opacities, which may represent aspiration or pneumonia. Recommend follow-up PA and lateral post treatment chest radiographs in 4-6 weeks. Aortic atherosclerosis. Electronically Signed   By: Ilona Sorrel M.D.   On: 01/17/2017 17:26   Dg Chest Port 1 View  Result Date: 01/19/2017 CLINICAL DATA:  Productive cough.  Shortness of breath. Tachypnea. EXAM: PORTABLE CHEST 1 VIEW COMPARISON:  Chest radiograph January 17, 2017  FINDINGS: Cardiac silhouette is mildly enlarged unchanged. Calcified aortic knob. Diffuse mild interstitial prominence with similar patchy LEFT lung base airspace opacity. Small LEFT pleural effusion. Dual lead LEFT cardiac pacemaker in situ. Mild apical pleural thickening. No pneumothorax. Osteopenia. IMPRESSION: Stable mild cardiomegaly and interstitial prominence. Persistent LEFT lung base suspected pneumonia with small LEFT pleural effusion. Followup PA and lateral chest X-ray is recommended in 3-4 weeks following trial of antibiotic therapy to ensure resolution and exclude underlying malignancy. Electronically Signed   By: Elon Alas M.D.   On: 01/19/2017 21:45        Discharge Exam: Vitals:   01/20/17 2032 01/21/17 0443  BP: 95/73 (!) 140/54  Pulse: 81 70  Resp: 18 18  Temp: 98.2 F (36.8 C) 98 F (36.7 C)   Vitals:   01/20/17 1949 01/20/17 2032 01/21/17 0443 01/21/17 0745  BP:  95/73 (!) 140/54   Pulse:  81 70   Resp:  18 18   Temp:  98.2 F (36.8 C) 98 F (36.7 C)   TempSrc:  Oral Oral   SpO2: 91% 93% 92% 94%  Weight:   69.2 kg (152 lb 8.9 oz)   Height:        General: Pt is alert, awake, not in acute distress Cardiovascular: RRR, S1/S2 +, no rubs, no gallops Respiratory:bibasilar rales, no wheeze Abdominal: Soft, NT, ND, bowel sounds + Extremities: no edema, no cyanosis   The results of significant  diagnostics from this hospitalization (including imaging, microbiology, ancillary and laboratory) are listed below for reference.    Significant Diagnostic Studies: Dg Chest 2 View  Result Date: 01/17/2017 CLINICAL DATA:  Cough.  Fever. EXAM: CHEST  2 VIEW COMPARISON:  10/10/2013 chest radiograph. FINDINGS: Stable configuration of 2 lead left subclavian pacemaker. Stable cardiomediastinal silhouette with aortic atherosclerosis and top-normal heart size. No pneumothorax. No pleural effusion. Patchy left lower lobe opacities. No overt pulmonary edema. IMPRESSION:  Patchy left lower lobe opacities, which may represent aspiration or pneumonia. Recommend follow-up PA and lateral post treatment chest radiographs in 4-6 weeks. Aortic atherosclerosis. Electronically Signed   By: Ilona Sorrel M.D.   On: 01/17/2017 17:26   Dg Chest Port 1 View  Result Date: 01/19/2017 CLINICAL DATA:  Productive cough.  Shortness of breath. Tachypnea. EXAM: PORTABLE CHEST 1 VIEW COMPARISON:  Chest radiograph January 17, 2017 FINDINGS: Cardiac silhouette is mildly enlarged unchanged. Calcified aortic knob. Diffuse mild interstitial prominence with similar patchy LEFT lung base airspace opacity. Small LEFT pleural effusion. Dual lead LEFT cardiac pacemaker in situ. Mild apical pleural thickening. No pneumothorax. Osteopenia. IMPRESSION: Stable mild cardiomegaly and interstitial prominence. Persistent LEFT lung base suspected pneumonia with small LEFT pleural effusion. Followup PA and lateral chest X-ray is recommended in 3-4 weeks following trial of antibiotic therapy to ensure resolution and exclude underlying malignancy. Electronically Signed   By: Elon Alas M.D.   On: 01/19/2017 21:45     Microbiology: Recent Results (from the past 240 hour(s))  Blood culture (routine x 2)     Status: None (Preliminary result)   Collection Time: 01/17/17  4:52 PM  Result Value Ref Range Status   Specimen Description BLOOD RIGHT ANTECUBITAL  Final   Special Requests   Final    BOTTLES DRAWN AEROBIC AND ANAEROBIC Blood Culture adequate volume   Culture   Final    NO GROWTH 3 DAYS Performed at [facility], [address], [place], [place] [address]    Report Status PENDING  Incomplete  Blood culture (routine x 2)     Status: None (Preliminary result)   Collection Time: 01/17/17  4:57 PM  Result Value Ref Range Status   Specimen Description BLOOD RIGHT HAND  Final   Special Requests IN PEDIATRIC BOTTLE Blood Culture adequate volume  Final   Culture   Final    NO GROWTH 3  DAYS Performed at [facility], [place] [address], [place], [place] [address]    Report Status PENDING  Incomplete  MRSA PCR Screening     Status: None   Collection Time: 01/17/17 11:59 PM  Result Value Ref Range Status   MRSA by PCR NEGATIVE NEGATIVE Final    Comment:        The GeneXpert MRSA Assay (FDA approved for NASAL specimens only), is one component of a comprehensive MRSA colonization surveillance program. It is not intended to diagnose MRSA infection nor to guide or monitor treatment for MRSA infections.   Culture, sputum-assessment     Status: None   Collection Time: 01/18/17  7:34 AM  Result Value Ref Range Status   Specimen Description SPUTUM  Final   Special Requests NONE  Final   Sputum evaluation THIS SPECIMEN IS ACCEPTABLE FOR SPUTUM CULTURE  Final   Report Status 01/18/2017 FINAL  Final  Culture, respiratory (NON-Expectorated)     Status: None   Collection Time: 01/18/17  7:34 AM  Result Value Ref Range Status   Specimen Description  SPUTUM  Final   Special Requests NONE Reflexed from [id number]  Final   Gram Stain   Final    RARE WBC PRESENT, PREDOMINANTLY PMN FEW GRAM NEGATIVE RODS FEW GRAM NEGATIVE COCCOBACILLI RARE GRAM POSITIVE COCCI IN PAIRS    Culture   Final    Consistent with normal respiratory flora. Performed at [facility], [place] [address] [place], [place] [address]    Report Status 01/20/2017 FINAL  Final     Labs: Basic Metabolic Panel:  Recent Labs Lab 01/17/17 1643 01/17/17 1758 01/18/17 0446 01/19/17 0554 01/20/17 0514  NA 132* 132* 136 135 137  K 3.2* 3.2* 3.4* 3.2* 3.6  CL 96* 96* 103 103 103  CO2 23  --  24 25 26   GLUCOSE 150* 145* 106* 113* 100*  BUN 18 17 15 15 15   CREATININE 0.81 0.70 0.71 0.73 0.75  CALCIUM 9.1  --  8.1* 7.8* 7.6*  MG  --   --  1.9 1.8 2.2   Liver Function Tests:  Recent Labs Lab 01/17/17 1643  AST 50*  ALT 29  ALKPHOS 65  BILITOT 0.6  PROT 7.9  ALBUMIN 4.4   No results for  input(s): LIPASE, AMYLASE in the last 168 hours. No results for input(s): AMMONIA in the last 168 hours. CBC:  Recent Labs Lab 01/17/17 1643 01/17/17 1758 01/18/17 0446 01/20/17 0514  WBC 8.2  --  6.6 5.5  NEUTROABS 6.8  --   --   --   HGB 14.4 15.3* 12.4 12.3  HCT 40.4 45.0 35.8* 36.8  MCV 87.4  --  88.8 91.3  PLT 222  --  192 206   Cardiac Enzymes:  Recent Labs Lab 01/17/17 2149 01/18/17 0446 01/18/17 0738  TROPONINI 0.04* 0.04* 0.04*   BNP: Invalid input(s): POCBNP CBG: No results for input(s): GLUCAP in the last 168 hours.  Time coordinating discharge:  Greater than 30 minutes  Signed:  Jahmar Mckelvy, DO [facility] Pager: [phone] 01/21/2017, 10:59 AM

## 2017-01-21 NOTE — Clinical Social Work Note (Signed)
OK per MD for patient for d/c today back to [facility].  SW spoke to [place] at facility- DC summary and Fl2 sent to facility for review.  Ok to return to facility. Family to transport per nursing; she spoke with daughter Wells Guiles who confirmed above. HH orders sent to facility by [facility].  No further SW needs indicated.  Patient is alert but pleasantly confused.

## 2017-01-21 NOTE — Progress Notes (Addendum)
Called and gave report to [facility] at [facility]. IV removed intact. Family is transporting pt to [facility]. Pt is discharged.  Family made aware that prescriptions were called into [facility].

## 2017-01-22 LAB — CULTURE, BLOOD (ROUTINE X 2)
CULTURE: NO GROWTH
Culture: NO GROWTH
SPECIAL REQUESTS: ADEQUATE
Special Requests: ADEQUATE

## 2017-01-23 ENCOUNTER — Telehealth: Payer: Self-pay | Admitting: *Deleted

## 2017-01-23 NOTE — Telephone Encounter (Signed)
Transition Care Management Follow-up Telephone Call   Date discharged? 01/21/17   How have you been since you were released from the hospital? Spoke w/daughter [name]) she states pt is doing alright   Do you understand why you were in the hospital? YES   Do you understand the discharge instructions? YES   Where were you discharged to? [facility]   Items Reviewed:  Medications reviewed: YES  Allergies reviewed: YES  Dietary changes reviewed: YES, Heart healthy  Referrals reviewed: No referral needed   Functional Questionnaire:   Activities of Daily Living (ADLs):   She states she are independent in the following: feeding, continence, grooming and toileting States they require assistance with the following: ambulation, bathing and hygiene and dressing   Any transportation issues/concerns?: NO  Any patient concerns? NO   Confirmed importance and date/time of follow-up visits scheduled YES, appt 02/03/17  Provider Appointment booked with Dr. Jenny Reichmann   Confirmed with patient if condition begins to worsen call PCP or go to the ER.  Patient was given the office number and encouraged to call back with question or concerns.  : YES

## 2017-02-03 ENCOUNTER — Ambulatory Visit (INDEPENDENT_AMBULATORY_CARE_PROVIDER_SITE_OTHER): Payer: Medicare Other | Admitting: Internal Medicine

## 2017-02-03 ENCOUNTER — Encounter: Payer: Self-pay | Admitting: Internal Medicine

## 2017-02-03 ENCOUNTER — Other Ambulatory Visit (INDEPENDENT_AMBULATORY_CARE_PROVIDER_SITE_OTHER): Payer: Medicare Other

## 2017-02-03 VITALS — BP 134/72 | HR 84 | Ht 65.0 in | Wt 159.0 lb

## 2017-02-03 DIAGNOSIS — J9601 Acute respiratory failure with hypoxia: Secondary | ICD-10-CM | POA: Diagnosis not present

## 2017-02-03 DIAGNOSIS — R7302 Impaired glucose tolerance (oral): Secondary | ICD-10-CM

## 2017-02-03 DIAGNOSIS — J181 Lobar pneumonia, unspecified organism: Secondary | ICD-10-CM

## 2017-02-03 DIAGNOSIS — I10 Essential (primary) hypertension: Secondary | ICD-10-CM

## 2017-02-03 LAB — CBC WITH DIFFERENTIAL/PLATELET
BASOS ABS: 0.1 10*3/uL (ref 0.0–0.1)
Basophils Relative: 1.1 % (ref 0.0–3.0)
Eosinophils Absolute: 0.2 10*3/uL (ref 0.0–0.7)
Eosinophils Relative: 2.6 % (ref 0.0–5.0)
HCT: 40.4 % (ref 36.0–46.0)
HEMOGLOBIN: 13.6 g/dL (ref 12.0–15.0)
LYMPHS PCT: 25 % (ref 12.0–46.0)
Lymphs Abs: 1.7 10*3/uL (ref 0.7–4.0)
MCHC: 33.6 g/dL (ref 30.0–36.0)
MCV: 91.6 fl (ref 78.0–100.0)
MONO ABS: 0.9 10*3/uL (ref 0.1–1.0)
MONOS PCT: 13.3 % — AB (ref 3.0–12.0)
NEUTROS ABS: 4 10*3/uL (ref 1.4–7.7)
NEUTROS PCT: 58 % (ref 43.0–77.0)
Platelets: 333 10*3/uL (ref 150.0–400.0)
RBC: 4.41 Mil/uL (ref 3.87–5.11)
RDW: 14.2 % (ref 11.5–15.5)
WBC: 7 10*3/uL (ref 4.0–10.5)

## 2017-02-03 LAB — BASIC METABOLIC PANEL
BUN: 19 mg/dL (ref 6–23)
CALCIUM: 9.2 mg/dL (ref 8.4–10.5)
CO2: 28 meq/L (ref 19–32)
CREATININE: 1.07 mg/dL (ref 0.40–1.20)
Chloride: 102 mEq/L (ref 96–112)
GFR: 50.93 mL/min — AB (ref >60.00)
Glucose, Bld: 94 mg/dL (ref 70–99)
Potassium: 3.8 mEq/L (ref 3.5–5.1)
SODIUM: 138 meq/L (ref 135–145)

## 2017-02-03 NOTE — Patient Instructions (Signed)

## 2017-02-03 NOTE — Progress Notes (Signed)
Subjective:    Patient ID: [name], female    DOB: 03/29/1922, 83 y.o.   MRN: [id number]  HPI  Here with family to assist; 83 year old female with history of hypertension, hyperlipidemia, stroke, complete AV block status post PPM, dementia, atrial fibrillation presented with 2-3 day history of increasing shortness of breath, chest congestion, and coughing, hospd 4/24-28 with LLL pna, felt to be aspirate in nature, and assoc with acute resp hypoxic failure.  Fortunately despite her advanced age she responded quite nicely over several days with IV antibx, duonebs, flutter valve, and increasing activity.  Was seen per speech tx felt to heed dysphagia 3 diet with thin liquids.  Completed 5 days azithromycin and rocephin, with d/c home with 3 days augmentin, which she has had good compliance without diarrhea or GI upset.  Was weaned off o2.  Remains deemed non candidate for anticoagulation. Comorbids stable.  Pt today seems to be quite energetic and vocal, though slower to stand and walk without assist, then requires some assist on table.  Family remains very impressed in her reuperative ability even when she seemed so infirm very recently. Pt has no fever, cough, worsening sob or other new complaints Past Medical History:  Diagnosis Date  . Aphasia 01/09/2009  . Arthritis    right hand  . 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 CEREBROVASCULAR ACCIDENT, HX OF 11/20/2007  . CHF (congestive heart failure) ([place])   . Coronary artery disease   . CVA 01/09/2009  . DEMENTIA 05/19/2008  . Dementia   . DIVERTICULOSIS, COLON 11/20/2007  . FATIGUE 11/20/2007  . Femoral fracture ([place]) 07/01/2013  . GLUCOSE INTOLERANCE 12/06/2010  . HYPERLIPIDEMIA 11/20/2007  . HYPERTENSION 11/20/2007   Echo was done 07/07/11: mod LVH, EF 55-60%, grade 1 diast dysfxn, mild MR, mild LAE, mod TR, PASP 39  . HYPOTHYROIDISM 11/20/2007  . Impaired glucose tolerance 06/03/2011  . IRON DEFICIENCY 12/06/2010  . Pacemaker 2002  .  PACEMAKER, PERMANENT 01/09/2009  . TIA 05/14/2003   elevated CE's in setting of TIA and falls in 10/12 - echo normal with no further cardiac w/u pursued  . TIA (transient ischemic attack) 10/10/2011  . Urinary incontinence 01/09/2009   Past Surgical History:  Procedure Laterality Date  . ABDOMINAL HYSTERECTOMY    . CHOLECYSTECTOMY    . CYSTOCELE REPAIR N/A 11/26/2012   Procedure: Cystocele Repair with Lefort Colpocleisis.  colpectomy Levatorplasty Perineorraphy;  Surgeon: Lovenia Kim, MD;  Location: [facility];  Service: Gynecology;  Laterality: N/A;  Cystocele Repair with Lefort Colpocleisis.  Marland Kitchen HIP ARTHROPLASTY Left 07/02/2013   Procedure: ARTHROPLASTY BIPOLAR HIP;  Surgeon: Mauri Pole, MD;  Location: [facility];  Service: Orthopedics;  Laterality: Left;  . INGUINAL HERNIA REPAIR Right 08/13/2013   Procedure: HERNIA REPAIR INGUINAL INCARCERATED;  Surgeon: Ralene Ok, MD;  Location: [facility];  Service: General;  Laterality: Right;  . Medtronic Cynthia dual chamber pacemaker serial number was [id number] H...04/15/2009    . s/p cerebral aneurysm  1991  . s/p pacemaker DDD    . s/p retinal attachment    . s/p thyroidectomy      reports that she has never smoked. She has never used smokeless tobacco. She reports that she does not drink alcohol or use drugs. family history includes Cancer in her mother. Allergies  Allergen Reactions  . Codeine     unknown   Current Outpatient Prescriptions on File Prior to Visit  Medication Sig Dispense Refill  . ALPRAZolam (XANAX) 0.25 MG tablet  TAKE 1/2 -1 TABLET BY MOUTH 2 TIMES DAILY AS NEEDED 40 tablet 1  . amLODipine (NORVASC) 10 MG tablet Take 1 tablet (10 mg total) by mouth daily. Resume in 3 days if SBP > 150 90 tablet 3  . Calcium Carbonate-Vitamin D (CALTRATE 600+D) 600-400 MG-UNIT per tablet Take 1 tablet by mouth at bedtime.     . clopidogrel (PLAVIX) 75 MG tablet Take 1 tablet (75 mg total) by mouth daily. 90 tablet 3  . Docusate Sodium (DSS)  100 MG CAPS Take 100 mg by mouth every morning.     . donepezil (ARICEPT) 10 MG tablet Take 1 tablet (10 mg total) by mouth daily. 90 tablet 3  . feeding supplement, ENSURE COMPLETE, (ENSURE COMPLETE) LIQD Take 237 mLs by mouth 2 (two) times daily between meals.    Marland Kitchen levothyroxine (SYNTHROID, LEVOTHROID) 125 MCG tablet Take 1 tablet (125 mcg total) by mouth daily. 90 tablet 3  . losartan (COZAAR) 100 MG tablet Take 1 tablet (100 mg total) by mouth daily. 90 tablet 3  . Multiple Vitamin (MULTIVITAMIN WITH MINERALS) TABS Take 1 tablet by mouth every morning. Centrum silver    . polyethylene glycol (MIRALAX / GLYCOLAX) packet Take 17 g by mouth daily as needed for mild constipation.    . polyvinyl alcohol (ARTIFICIAL TEARS) 1.4 % ophthalmic solution Place 1 drop into both eyes 4 (four) times daily.     . pravastatin (PRAVACHOL) 40 MG tablet Take 1 tablet (40 mg total) by mouth daily. 90 tablet 3   No current facility-administered medications on file prior to visit.    Review of Systems - verified per family  Constitutional: Negative for other unusual diaphoresis or sweats HENT: Negative for ear discharge or swelling Eyes: Negative for other worsening visual disturbances Respiratory: Negative for stridor or other swelling  Gastrointestinal: Negative for worsening distension or other blood Genitourinary: Negative for retention or other urinary change Musculoskeletal: Negative for other MSK pain or swelling Skin: Negative for color change or other new lesions Neurological: Negative for worsening tremors and other numbness  Psychiatric/Behavioral: Negative for worsening agitation or other fatigue All other system neg per pt    Objective:   Physical Exam BP 134/72   Pulse 84   Ht 5\' 5"  (1.651 m)   Wt 159 lb (72.1 kg)   SpO2 99%   BMI 26.46 kg/m  VS noted,  Constitutional: Pt appears in NAD HENT: Head: NCAT.  Right Ear: External ear normal.  Left Ear: External ear normal.  Eyes: .  Pupils are equal, round, and reactive to light. Conjunctivae and EOM are normal Nose: without d/c or deformity Neck: Neck supple. Gross normal ROM Cardiovascular: Normal rate and regular rhythm.   Pulmonary/Chest: Effort normal and breath sounds decreased without rales or wheezing.  Abd:  Soft, NT, ND, + BS, no organomegaly Neurological: Pt is alert. At baseline orientation, motor grossly intact Skin: Skin is warm. No rashes, other new lesions, no LE edema Psychiatric: Pt behavior is normal without agitation  No other exam findings.  Lab Results  Component Value Date   WBC 7.0 02/03/2017   HGB 13.6 02/03/2017   HCT 40.4 02/03/2017   PLT 333.0 02/03/2017   GLUCOSE 94 02/03/2017   CHOL 124 01/18/2017   TRIG 76 01/18/2017   HDL 48 01/18/2017   LDLDIRECT 73.4 11/18/2009   LDLCALC 61 01/18/2017   ALT 29 01/17/2017   AST 50 (H) 01/17/2017   NA 138 02/03/2017   K 3.8 02/03/2017  CL 102 02/03/2017   CREATININE 1.07 02/03/2017   BUN 19 02/03/2017   CO2 28 02/03/2017   TSH 2.82 10/27/2016   INR 1.06 07/06/2011   HGBA1C 6.0 (H) 01/18/2017       Assessment & Plan:

## 2017-02-05 NOTE — Assessment & Plan Note (Signed)
Stable and resolved,  to f/u any worsening symptoms or concerns

## 2017-02-05 NOTE — Assessment & Plan Note (Signed)
Clinically resolved, no further tx needed at this time,  to f/u any worsening symptoms or concerns

## 2017-02-05 NOTE — Assessment & Plan Note (Signed)
stable overall by history and exam, recent data reviewed with pt, and pt to continue medical treatment as before,  to f/u any worsening symptoms or concerns Lab Results  Component Value Date   HGBA1C 6.0 (H) 01/18/2017   

## 2017-02-05 NOTE — Assessment & Plan Note (Signed)
.  stable overall by history and exam, recent data reviewed with pt, and pt to continue medical treatment as before,  to f/u any worsening symptoms or concerns BP Readings from Last 3 Encounters:  02/03/17 134/72  01/21/17 (!) 139/54  01/17/17 124/80

## 2017-02-13 ENCOUNTER — Telehealth: Payer: Self-pay | Admitting: Cardiology

## 2017-02-13 ENCOUNTER — Ambulatory Visit (INDEPENDENT_AMBULATORY_CARE_PROVIDER_SITE_OTHER): Payer: Medicare Other | Admitting: *Deleted

## 2017-02-13 DIAGNOSIS — I442 Atrioventricular block, complete: Secondary | ICD-10-CM

## 2017-02-13 NOTE — Telephone Encounter (Signed)
Confirmed remote transmission w/ pt daughter in law.   

## 2017-02-14 LAB — CUP PACEART REMOTE DEVICE CHECK
Battery Impedance: 2038 Ohm
Brady Statistic AP VP Percent: 36 %
Brady Statistic AP VS Percent: 0 %
Brady Statistic AS VP Percent: 64 %
Brady Statistic AS VS Percent: 0 %
Date Time Interrogation Session: 20180522151833
Implantable Lead Implant Date: 20020920
Implantable Lead Implant Date: 20020920
Implantable Lead Location: 753860
Implantable Pulse Generator Implant Date: 20100721
Lead Channel Impedance Value: 548 Ohm
Lead Channel Impedance Value: 631 Ohm
Lead Channel Pacing Threshold Pulse Width: 0.4 ms
Lead Channel Setting Pacing Amplitude: 2.5 V
MDC IDC LEAD LOCATION: 753859
MDC IDC MSMT BATTERY REMAINING LONGEVITY: 28 mo
MDC IDC MSMT BATTERY VOLTAGE: 2.75 V
MDC IDC MSMT LEADCHNL RA PACING THRESHOLD AMPLITUDE: 0.625 V
MDC IDC MSMT LEADCHNL RV PACING THRESHOLD AMPLITUDE: 0.875 V
MDC IDC MSMT LEADCHNL RV PACING THRESHOLD PULSEWIDTH: 0.4 ms
MDC IDC SET LEADCHNL RA PACING AMPLITUDE: 2 V
MDC IDC SET LEADCHNL RV PACING PULSEWIDTH: 0.4 ms
MDC IDC SET LEADCHNL RV SENSING SENSITIVITY: 2.8 mV

## 2017-02-14 NOTE — Progress Notes (Signed)
Remote pacemaker transmission.   

## 2017-02-15 ENCOUNTER — Encounter: Payer: Self-pay | Admitting: Cardiology

## 2017-03-01 ENCOUNTER — Encounter: Payer: Self-pay | Admitting: Cardiology

## 2017-04-26 ENCOUNTER — Ambulatory Visit: Payer: Medicare Other | Admitting: Internal Medicine

## 2017-04-26 ENCOUNTER — Encounter: Payer: Self-pay | Admitting: Internal Medicine

## 2017-04-26 ENCOUNTER — Ambulatory Visit (INDEPENDENT_AMBULATORY_CARE_PROVIDER_SITE_OTHER): Payer: Medicare Other | Admitting: Internal Medicine

## 2017-04-26 VITALS — BP 140/78 | HR 88 | Ht 65.0 in | Wt 143.0 lb

## 2017-04-26 DIAGNOSIS — I10 Essential (primary) hypertension: Secondary | ICD-10-CM

## 2017-04-26 DIAGNOSIS — R938 Abnormal findings on diagnostic imaging of other specified body structures: Secondary | ICD-10-CM | POA: Diagnosis not present

## 2017-04-26 DIAGNOSIS — R9389 Abnormal findings on diagnostic imaging of other specified body structures: Secondary | ICD-10-CM | POA: Insufficient documentation

## 2017-04-26 DIAGNOSIS — R7302 Impaired glucose tolerance (oral): Secondary | ICD-10-CM | POA: Diagnosis not present

## 2017-04-26 DIAGNOSIS — E038 Other specified hypothyroidism: Secondary | ICD-10-CM | POA: Diagnosis not present

## 2017-04-26 NOTE — Assessment & Plan Note (Signed)
stable overall by history and exam, recent data reviewed with pt, and pt to continue medical treatment as before,  to f/u any worsening symptoms or concerns BP Readings from Last 3 Encounters:  04/26/17 140/78  02/03/17 134/72  01/21/17 (!) 139/54

## 2017-04-26 NOTE — Patient Instructions (Addendum)
Please continue all other medications as before, and refills have been done if requested.  Please have the pharmacy call with any other refills you may need.  Please continue your efforts at being more active, low cholesterol diet, and weight control.  Please keep your appointments with your specialists as you may have planned  Please return in 6 months, or sooner if needed 

## 2017-04-26 NOTE — Progress Notes (Signed)
Subjective:    Patient ID: [name], female    DOB: Feb 29, 1922, 83 y.o.   MRN: [id number]  HPI .Here to f/u with daughter in law who usually accompanies her; overall doing ok, remains VERY vigorous for age and pleasantly demented; walks with walker, no recent falls.  Pt denies chest pain, increasing sob or doe, wheezing, orthopnea, PND, increased LE swelling, palpitations, dizziness or syncope.  Pt denies new neurological symptoms such as new headache, or facial or extremity weakness or numbness.  Pt denies polydipsia, polyuria,.  Pt family states overall good compliance with meds, mostly trying to follow appropriate diet, with wt overall down 15 lbs. Wt Readings from Last 3 Encounters:  04/26/17 143 lb (64.9 kg)  02/03/17 159 lb (72.1 kg)  01/21/17 152 lb 8.9 oz (69.2 kg)   Past Medical History:  Diagnosis Date  . Aphasia 01/09/2009  . Arthritis    right hand  . 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 CEREBROVASCULAR ACCIDENT, HX OF 11/20/2007  . CHF (congestive heart failure) ([place])   . Coronary artery disease   . CVA 01/09/2009  . DEMENTIA 05/19/2008  . Dementia   . DIVERTICULOSIS, COLON 11/20/2007  . FATIGUE 11/20/2007  . Femoral fracture ([place]) 07/01/2013  . GLUCOSE INTOLERANCE 12/06/2010  . HYPERLIPIDEMIA 11/20/2007  . HYPERTENSION 11/20/2007   Echo was done 07/07/11: mod LVH, EF 55-60%, grade 1 diast dysfxn, mild MR, mild LAE, mod TR, PASP 39  . HYPOTHYROIDISM 11/20/2007  . Impaired glucose tolerance 06/03/2011  . IRON DEFICIENCY 12/06/2010  . Pacemaker 2002  . PACEMAKER, PERMANENT 01/09/2009  . TIA 05/14/2003   elevated CE's in setting of TIA and falls in 10/12 - echo normal with no further cardiac w/u pursued  . TIA (transient ischemic attack) 10/10/2011  . Urinary incontinence 01/09/2009   Past Surgical History:  Procedure Laterality Date  . ABDOMINAL HYSTERECTOMY    . CHOLECYSTECTOMY    . CYSTOCELE REPAIR N/A 11/26/2012   Procedure: Cystocele Repair with Lefort Colpocleisis.   colpectomy Levatorplasty Perineorraphy;  Surgeon: Lovenia Kim, MD;  Location: [facility];  Service: Gynecology;  Laterality: N/A;  Cystocele Repair with Lefort Colpocleisis.  Marland Kitchen HIP ARTHROPLASTY Left 07/02/2013   Procedure: ARTHROPLASTY BIPOLAR HIP;  Surgeon: Mauri Pole, MD;  Location: [facility];  Service: Orthopedics;  Laterality: Left;  . INGUINAL HERNIA REPAIR Right 08/13/2013   Procedure: HERNIA REPAIR INGUINAL INCARCERATED;  Surgeon: Ralene Ok, MD;  Location: [facility];  Service: General;  Laterality: Right;  . Medtronic Cynthia dual chamber pacemaker serial number was [id number] H...04/15/2009    . s/p cerebral aneurysm  1991  . s/p pacemaker DDD    . s/p retinal attachment    . s/p thyroidectomy      reports that she has never smoked. She has never used smokeless tobacco. She reports that she does not drink alcohol or use drugs. family history includes Cancer in her mother. Allergies  Allergen Reactions  . Codeine     unknown   Current Outpatient Prescriptions on File Prior to Visit  Medication Sig Dispense Refill  . ALPRAZolam (XANAX) 0.25 MG tablet TAKE 1/2 -1 TABLET BY MOUTH 2 TIMES DAILY AS NEEDED 40 tablet 1  . amLODipine (NORVASC) 10 MG tablet Take 1 tablet (10 mg total) by mouth daily. Resume in 3 days if SBP > 150 90 tablet 3  . Calcium Carbonate-Vitamin D (CALTRATE 600+D) 600-400 MG-UNIT per tablet Take 1 tablet by mouth at bedtime.     . clopidogrel (  PLAVIX) 75 MG tablet Take 1 tablet (75 mg total) by mouth daily. 90 tablet 3  . Docusate Sodium (DSS) 100 MG CAPS Take 100 mg by mouth every morning.     . donepezil (ARICEPT) 10 MG tablet Take 1 tablet (10 mg total) by mouth daily. 90 tablet 3  . feeding supplement, ENSURE COMPLETE, (ENSURE COMPLETE) LIQD Take 237 mLs by mouth 2 (two) times daily between meals.    Marland Kitchen levothyroxine (SYNTHROID, LEVOTHROID) 125 MCG tablet Take 1 tablet (125 mcg total) by mouth daily. 90 tablet 3  . losartan (COZAAR) 100 MG tablet Take 1 tablet  (100 mg total) by mouth daily. 90 tablet 3  . Multiple Vitamin (MULTIVITAMIN WITH MINERALS) TABS Take 1 tablet by mouth every morning. Centrum silver    . polyethylene glycol (MIRALAX / GLYCOLAX) packet Take 17 g by mouth daily as needed for mild constipation.    . polyvinyl alcohol (ARTIFICIAL TEARS) 1.4 % ophthalmic solution Place 1 drop into both eyes 4 (four) times daily.     . pravastatin (PRAVACHOL) 40 MG tablet Take 1 tablet (40 mg total) by mouth daily. 90 tablet 3   No current facility-administered medications on file prior to visit.    Review of Systems  Constitutional: Negative for other unusual diaphoresis or sweats HENT: Negative for ear discharge or swelling Eyes: Negative for other worsening visual disturbances Respiratory: Negative for stridor or other swelling  Gastrointestinal: Negative for worsening distension or other blood Genitourinary: Negative for retention or other urinary change Musculoskeletal: Negative for other MSK pain or swelling Skin: Negative for color change or other new lesions Neurological: Negative for worsening tremors and other numbness  Psychiatric/Behavioral: Negative for worsening agitation or other fatigue All other system neg per pt    Objective:   Physical Exam BP 140/78   Pulse 88   Ht 5\' 5"  (1.651 m)   Wt 143 lb (64.9 kg)   SpO2 99%   BMI 23.80 kg/m  VS noted,  Constitutional: Pt appears in NAD HENT: Head: NCAT.  Right Ear: External ear normal.  Left Ear: External ear normal.  Eyes: . Pupils are equal, round, and reactive to light. Conjunctivae and EOM are normal Nose: without d/c or deformity Neck: Neck supple. Gross normal ROM Cardiovascular: Normal rate and regular rhythm.   Pulmonary/Chest: Effort normal and breath sounds without rales or wheezing.  Abd:  Soft, NT, ND, + BS, no organomegaly Neurological: Pt is alert. At baseline orientation, motor grossly intact Skin: Skin is warm. No rashes, other new lesions, no LE  edema Psychiatric: Pt behavior is normal without agitation  No other exam findings   PORTABLE CHEST 1 VIEW 01/19/2017 - summary IMPRESSION: Stable mild cardiomegaly and interstitial prominence. Persistent LEFT lung base suspected pneumonia with small LEFT pleural effusion. Followup PA and lateral chest X-ray is recommended in 3-4 weeks following trial of antibiotic therapy to ensure resolution and exclude underlying malignancy.       Assessment & Plan:

## 2017-04-26 NOTE — Assessment & Plan Note (Signed)
With recent wt loss is suspicious for underlying malignancy; pt remains vigorous and in her usual state of health; daughter in law acts as her health proxy and declines cxr for her today

## 2017-04-26 NOTE — Assessment & Plan Note (Signed)
stable overall by history and exam, recent data reviewed with pt, and pt to continue medical treatment as before,  to f/u any worsening symptoms or concerns Lab Results  Component Value Date   HGBA1C 6.0 (H) 01/18/2017   

## 2017-04-26 NOTE — Assessment & Plan Note (Signed)
Lab Results  Component Value Date   TSH 2.82 10/27/2016  stable overall by history and exam, recent data reviewed with pt, and pt to continue medical treatment as before,  to f/u any worsening symptoms or concerns

## 2017-05-03 ENCOUNTER — Encounter: Payer: Self-pay | Admitting: Internal Medicine

## 2017-05-03 MED ORDER — PRAVASTATIN SODIUM 40 MG PO TABS: 40.0000 mg | ORAL_TABLET | Freq: Every day | ORAL | 3 refills | Status: DC

## 2017-05-03 MED ORDER — LOSARTAN POTASSIUM 100 MG PO TABS: 100.0000 mg | ORAL_TABLET | Freq: Every day | ORAL | 3 refills | Status: DC

## 2017-05-03 MED ORDER — DONEPEZIL HCL 10 MG PO TABS: 10.0000 mg | ORAL_TABLET | Freq: Every day | ORAL | 3 refills | Status: DC

## 2017-05-03 MED ORDER — AMLODIPINE BESYLATE 10 MG PO TABS: 10.0000 mg | ORAL_TABLET | Freq: Every day | ORAL | 3 refills | Status: DC

## 2017-05-03 MED ORDER — CLOPIDOGREL BISULFATE 75 MG PO TABS: 75.0000 mg | ORAL_TABLET | Freq: Every day | ORAL | 3 refills | Status: DC

## 2017-05-03 MED ORDER — LEVOTHYROXINE SODIUM 125 MCG PO TABS: 125.0000 ug | ORAL_TABLET | Freq: Every day | ORAL | 3 refills | Status: DC

## 2017-05-16 ENCOUNTER — Ambulatory Visit (INDEPENDENT_AMBULATORY_CARE_PROVIDER_SITE_OTHER): Payer: Medicare Other | Admitting: *Deleted

## 2017-05-16 DIAGNOSIS — I442 Atrioventricular block, complete: Secondary | ICD-10-CM

## 2017-05-17 LAB — CUP PACEART REMOTE DEVICE CHECK
Battery Impedance: 2131 Ohm
Battery Remaining Longevity: 27 mo
Brady Statistic AP VP Percent: 36 %
Brady Statistic AS VS Percent: 0 %
Implantable Lead Implant Date: 20020920
Implantable Lead Location: 753859
Implantable Lead Model: 5076
Implantable Lead Model: 5076
Implantable Pulse Generator Implant Date: 20100721
Lead Channel Impedance Value: 565 Ohm
Lead Channel Impedance Value: 619 Ohm
Lead Channel Pacing Threshold Amplitude: 0.75 V
Lead Channel Pacing Threshold Pulse Width: 0.4 ms
Lead Channel Setting Pacing Pulse Width: 0.4 ms
MDC IDC LEAD IMPLANT DT: 20020920
MDC IDC LEAD LOCATION: 753860
MDC IDC MSMT BATTERY VOLTAGE: 2.75 V
MDC IDC MSMT LEADCHNL RA PACING THRESHOLD AMPLITUDE: 0.5 V
MDC IDC MSMT LEADCHNL RA PACING THRESHOLD PULSEWIDTH: 0.4 ms
MDC IDC SESS DTM: 20180821141222
MDC IDC SET LEADCHNL RA PACING AMPLITUDE: 2 V
MDC IDC SET LEADCHNL RV PACING AMPLITUDE: 2.5 V
MDC IDC SET LEADCHNL RV SENSING SENSITIVITY: 2.8 mV
MDC IDC STAT BRADY AP VS PERCENT: 0 %
MDC IDC STAT BRADY AS VP PERCENT: 64 %

## 2017-05-17 NOTE — Progress Notes (Signed)
Remote pacemaker transmission.   

## 2017-05-26 ENCOUNTER — Encounter: Payer: Self-pay | Admitting: Cardiology

## 2017-06-09 ENCOUNTER — Encounter: Payer: Self-pay | Admitting: Cardiology

## 2017-08-07 DIAGNOSIS — Z23 Encounter for immunization: Secondary | ICD-10-CM | POA: Diagnosis not present

## 2017-08-15 ENCOUNTER — Telehealth: Payer: Self-pay | Admitting: Cardiology

## 2017-08-15 ENCOUNTER — Ambulatory Visit (INDEPENDENT_AMBULATORY_CARE_PROVIDER_SITE_OTHER): Payer: Medicare Other | Admitting: *Deleted

## 2017-08-15 DIAGNOSIS — H903 Sensorineural hearing loss, bilateral: Secondary | ICD-10-CM | POA: Diagnosis not present

## 2017-08-15 DIAGNOSIS — I442 Atrioventricular block, complete: Secondary | ICD-10-CM

## 2017-08-15 NOTE — Telephone Encounter (Signed)
Confirmed remote transmission w/ pt daughter in law.   

## 2017-08-16 LAB — CUP PACEART REMOTE DEVICE CHECK
Battery Impedance: 2251 Ohm
Battery Remaining Longevity: 26 mo
Battery Voltage: 2.74 V
Brady Statistic AP VP Percent: 36 %
Brady Statistic AS VS Percent: 0 %
Implantable Lead Implant Date: 20020920
Implantable Lead Implant Date: 20020920
Implantable Lead Location: 753859
Implantable Lead Location: 753860
Implantable Lead Model: 5076
Implantable Lead Model: 5076
Implantable Pulse Generator Implant Date: 20100721
Lead Channel Impedance Value: 584 Ohm
Lead Channel Pacing Threshold Amplitude: 0.5 V
Lead Channel Pacing Threshold Amplitude: 0.75 V
Lead Channel Pacing Threshold Pulse Width: 0.4 ms
Lead Channel Pacing Threshold Pulse Width: 0.4 ms
MDC IDC MSMT LEADCHNL RA IMPEDANCE VALUE: 657 Ohm
MDC IDC SESS DTM: 20181120190656
MDC IDC SET LEADCHNL RA PACING AMPLITUDE: 2 V
MDC IDC SET LEADCHNL RV PACING AMPLITUDE: 2.5 V
MDC IDC SET LEADCHNL RV PACING PULSEWIDTH: 0.4 ms
MDC IDC SET LEADCHNL RV SENSING SENSITIVITY: 2.8 mV
MDC IDC STAT BRADY AP VS PERCENT: 0 %
MDC IDC STAT BRADY AS VP PERCENT: 64 %

## 2017-08-16 NOTE — Progress Notes (Signed)
Remote pacemaker transmission.   

## 2017-08-24 ENCOUNTER — Encounter: Payer: Self-pay | Admitting: Cardiology

## 2017-09-20 ENCOUNTER — Encounter: Payer: Medicare Other | Admitting: Internal Medicine

## 2017-10-24 ENCOUNTER — Ambulatory Visit (INDEPENDENT_AMBULATORY_CARE_PROVIDER_SITE_OTHER): Payer: Medicare Other | Admitting: Internal Medicine

## 2017-10-24 ENCOUNTER — Encounter: Payer: Self-pay | Admitting: Internal Medicine

## 2017-10-24 VITALS — BP 140/82 | HR 82 | Ht 65.0 in | Wt 152.0 lb

## 2017-10-24 DIAGNOSIS — Z950 Presence of cardiac pacemaker: Secondary | ICD-10-CM | POA: Diagnosis not present

## 2017-10-24 DIAGNOSIS — I480 Paroxysmal atrial fibrillation: Secondary | ICD-10-CM | POA: Diagnosis not present

## 2017-10-24 DIAGNOSIS — I442 Atrioventricular block, complete: Secondary | ICD-10-CM

## 2017-10-24 NOTE — Progress Notes (Signed)
HPI [name] returns today for followup. She has a history of complete heart block, status post permanent pacemaker insertion. She is a history of cerebral aneurysm and underwent surgical clipping approximately 20 years ago. The procedure was complicated by large stroke. Since then she has had multiple recurrent strokes. She has had a history of atrial fibrillation documented on her pacemaker with pacemaker interrogations. In the past year, she has not fallen. She denies chest pain, shortness of breath, or peripheral edema. The patient denies sob. She is walking daily, living at [facility] Allergies  Allergen Reactions  . Codeine     unknown     Current Outpatient Medications  Medication Sig Dispense Refill  . ALPRAZolam (XANAX) 0.25 MG tablet TAKE 1/2 -1 TABLET BY MOUTH 2 TIMES DAILY AS NEEDED 40 tablet 1  . amLODipine (NORVASC) 10 MG tablet Take 1 tablet (10 mg total) by mouth daily. Resume in 3 days if SBP > 150 90 tablet 3  . Calcium Carbonate-Vitamin D (CALTRATE 600+D) 600-400 MG-UNIT per tablet Take 1 tablet by mouth at bedtime.     . clopidogrel (PLAVIX) 75 MG tablet Take 1 tablet (75 mg total) by mouth daily. 90 tablet 3  . Docusate Sodium (DSS) 100 MG CAPS Take 100 mg by mouth every morning.     . donepezil (ARICEPT) 10 MG tablet Take 1 tablet (10 mg total) by mouth daily. 90 tablet 3  . feeding supplement, ENSURE COMPLETE, (ENSURE COMPLETE) LIQD Take 237 mLs by mouth 2 (two) times daily between meals.    Marland Kitchen levothyroxine (SYNTHROID, LEVOTHROID) 125 MCG tablet Take 1 tablet (125 mcg total) by mouth daily. 90 tablet 3  . losartan (COZAAR) 100 MG tablet Take 1 tablet (100 mg total) by mouth daily. 90 tablet 3  . Multiple Vitamin (MULTIVITAMIN WITH MINERALS) TABS Take 1 tablet by mouth every morning. Centrum silver    . polyethylene glycol (MIRALAX / GLYCOLAX) packet Take 17 g by mouth daily as needed for mild constipation.    . polyvinyl alcohol (ARTIFICIAL TEARS) 1.4 %  ophthalmic solution Place 1 drop into both eyes 4 (four) times daily.     . pravastatin (PRAVACHOL) 40 MG tablet Take 1 tablet (40 mg total) by mouth daily. 90 tablet 3   No current facility-administered medications for this visit.      Past Medical History:  Diagnosis Date  . Aphasia 01/09/2009  . Arthritis    right hand  . 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 CEREBROVASCULAR ACCIDENT, HX OF 11/20/2007  . CHF (congestive heart failure) ([place])   . Coronary artery disease   . CVA 01/09/2009  . DEMENTIA 05/19/2008  . Dementia   . DIVERTICULOSIS, COLON 11/20/2007  . FATIGUE 11/20/2007  . Femoral fracture ([place]) 07/01/2013  . GLUCOSE INTOLERANCE 12/06/2010  . HYPERLIPIDEMIA 11/20/2007  . HYPERTENSION 11/20/2007   Echo was done 07/07/11: mod LVH, EF 55-60%, grade 1 diast dysfxn, mild MR, mild LAE, mod TR, PASP 39  . HYPOTHYROIDISM 11/20/2007  . Impaired glucose tolerance 06/03/2011  . IRON DEFICIENCY 12/06/2010  . Pacemaker 2002  . PACEMAKER, PERMANENT 01/09/2009  . TIA 05/14/2003   elevated CE's in setting of TIA and falls in 10/12 - echo normal with no further cardiac w/u pursued  . TIA (transient ischemic attack) 10/10/2011  . Urinary incontinence 01/09/2009    ROS:   All systems reviewed and negative except as noted in the HPI.   Past Surgical History:  Procedure Laterality Date  .  ABDOMINAL HYSTERECTOMY    . CHOLECYSTECTOMY    . CYSTOCELE REPAIR N/A 11/26/2012   Procedure: Cystocele Repair with Lefort Colpocleisis.  colpectomy Levatorplasty Perineorraphy;  Surgeon: Lovenia Kim, MD;  Location: [facility];  Service: Gynecology;  Laterality: N/A;  Cystocele Repair with Lefort Colpocleisis.  Marland Kitchen HIP ARTHROPLASTY Left 07/02/2013   Procedure: ARTHROPLASTY BIPOLAR HIP;  Surgeon: Mauri Pole, MD;  Location: [facility];  Service: Orthopedics;  Laterality: Left;  . INGUINAL HERNIA REPAIR Right 08/13/2013   Procedure: HERNIA REPAIR INGUINAL INCARCERATED;  Surgeon: Ralene Ok, MD;  Location: [facility];   Service: General;  Laterality: Right;  . Medtronic Cynthia dual chamber pacemaker serial number was [id number] H...04/15/2009    . s/p cerebral aneurysm  1991  . s/p pacemaker DDD    . s/p retinal attachment    . s/p thyroidectomy       Family History  Problem Relation Age of Onset  . Cancer Mother        breast cancer     Social History   Socioeconomic History  . Marital status: Divorced    Spouse name: Not on file  . Number of children: Not on file  . Years of education: Not on file  . Highest education level: Not on file  Social Needs  . Financial resource strain: Not on file  . Food insecurity - worry: Not on file  . Food insecurity - inability: Not on file  . Transportation needs - medical: Not on file  . Transportation needs - non-medical: Not on file  Occupational History  . Occupation: retired Radiation protection practitioner, lives at [employer]  . Smoking status: Never Smoker  . Smokeless tobacco: Never Used  Substance and Sexual Activity  . Alcohol use: No  . Drug use: No  . Sexual activity: Not on file  Other Topics Concern  . Not on file  Social History Narrative  . Not on file     BP 140/82   Pulse 82   Ht 5\' 5"  (1.651 m)   Wt 152 lb (68.9 kg)   SpO2 96%   BMI 25.29 kg/m   Physical Exam:  Well appearing elderly woman, NAD HEENT: Unremarkable Neck:  6 cm JVD, no thyromegally Lymphatics:  No adenopathy Back:  No CVA tenderness Lungs:  Clear with no wheezes HEART:  Regular rate rhythm, no murmurs, no rubs, no clicks Abd:  soft, positive bowel sounds, no organomegally, no rebound, no guarding Ext:  2 plus pulses, no edema, no cyanosis, no clubbing Skin:  No rashes no nodules Neuro:  CN II through XII intact, motor grossly intact  EKG - none  DEVICE  Normal device function.  See PaceArt for details.   Assess/Plan: 1. Atrial fib - her rate is well controlled. She is not a candidate for anti-coagulation due to her h/o ICH. 2. Dyslipidemia - she  will continue her statin therapy.  3. HTN - her blood pressure is reasonably well controlled for her age. She will continue a low sodium diet. 4. PPM - interogation of her medtronic DDD PM demonstrates normal function and satisfactory battery longevity.  Mikle Bosworth.D.

## 2017-10-24 NOTE — Patient Instructions (Signed)
Medication Instructions:  Your physician recommends that you continue on your current medications as directed. Please refer to the Current Medication list given to you today.  Labwork: None ordered.  Testing/Procedures: None ordered.  Follow-Up: Your physician wants you to follow-up in: one year with Dr. Lovena Le.   You will receive a reminder letter in the mail two months in advance. If you don't receive a letter, please call our office to schedule the follow-up appointment.  Remote monitoring is used to monitor your Pacemaker from home. This monitoring reduces the number of office visits required to check your device to one time per year. It allows [place] to keep an eye on the functioning of your device to ensure it is working properly. You are scheduled for a device check from home on 11/14/2017. You may send your transmission at any time that day. If you have a wireless device, the transmission will be sent automatically. After your physician reviews your transmission, you will receive a postcard with your next transmission date.   Any Other Special Instructions Will Be Listed Below (If Applicable).  If you need a refill on your cardiac medications before your next appointment, please call your pharmacy.

## 2017-10-27 LAB — CUP PACEART INCLINIC DEVICE CHECK
Battery Impedance: 2323 Ohm
Battery Voltage: 2.73 V
Brady Statistic AS VP Percent: 64 %
Date Time Interrogation Session: 20190129205819
Implantable Lead Implant Date: 20020920
Implantable Lead Location: 753859
Implantable Lead Model: 5076
Implantable Lead Model: 5076
Implantable Pulse Generator Implant Date: 20100721
Lead Channel Pacing Threshold Amplitude: 0.5 V
Lead Channel Pacing Threshold Pulse Width: 0.4 ms
Lead Channel Pacing Threshold Pulse Width: 0.4 ms
Lead Channel Sensing Intrinsic Amplitude: 11.2 mV
Lead Channel Sensing Intrinsic Amplitude: 2.8 mV
Lead Channel Setting Pacing Amplitude: 2 V
Lead Channel Setting Pacing Pulse Width: 0.4 ms
MDC IDC LEAD IMPLANT DT: 20020920
MDC IDC LEAD LOCATION: 753860
MDC IDC MSMT BATTERY REMAINING LONGEVITY: 25 mo
MDC IDC MSMT LEADCHNL RA IMPEDANCE VALUE: 635 Ohm
MDC IDC MSMT LEADCHNL RA PACING THRESHOLD AMPLITUDE: 0.5 V
MDC IDC MSMT LEADCHNL RA PACING THRESHOLD PULSEWIDTH: 0.4 ms
MDC IDC MSMT LEADCHNL RV IMPEDANCE VALUE: 555 Ohm
MDC IDC MSMT LEADCHNL RV PACING THRESHOLD AMPLITUDE: 0.75 V
MDC IDC MSMT LEADCHNL RV PACING THRESHOLD AMPLITUDE: 0.75 V
MDC IDC MSMT LEADCHNL RV PACING THRESHOLD PULSEWIDTH: 0.4 ms
MDC IDC SET LEADCHNL RV PACING AMPLITUDE: 2.5 V
MDC IDC SET LEADCHNL RV SENSING SENSITIVITY: 2.8 mV
MDC IDC STAT BRADY AP VP PERCENT: 36 %
MDC IDC STAT BRADY AP VS PERCENT: 0 %
MDC IDC STAT BRADY AS VS PERCENT: 0 %

## 2017-11-01 ENCOUNTER — Ambulatory Visit: Payer: Medicare Other | Admitting: Internal Medicine

## 2017-11-02 ENCOUNTER — Ambulatory Visit: Payer: Medicare Other | Admitting: Internal Medicine

## 2017-11-02 DIAGNOSIS — Z0289 Encounter for other administrative examinations: Secondary | ICD-10-CM

## 2017-11-14 ENCOUNTER — Ambulatory Visit (INDEPENDENT_AMBULATORY_CARE_PROVIDER_SITE_OTHER): Payer: Medicare Other | Admitting: *Deleted

## 2017-11-14 ENCOUNTER — Telehealth: Payer: Self-pay | Admitting: Cardiology

## 2017-11-14 DIAGNOSIS — I442 Atrioventricular block, complete: Secondary | ICD-10-CM | POA: Diagnosis not present

## 2017-11-14 NOTE — Telephone Encounter (Signed)
LMOVM reminding pt to send remote transmission.   

## 2017-11-16 ENCOUNTER — Encounter: Payer: Self-pay | Admitting: Cardiology

## 2017-11-16 NOTE — Progress Notes (Signed)
Remote pacemaker transmission.   

## 2017-11-17 LAB — CUP PACEART REMOTE DEVICE CHECK
Battery Impedance: 2370 Ohm
Brady Statistic AP VP Percent: 32 %
Brady Statistic AS VS Percent: 0 %
Implantable Lead Implant Date: 20020920
Implantable Lead Location: 753860
Implantable Lead Model: 5076
Implantable Lead Model: 5076
Implantable Pulse Generator Implant Date: 20100721
Lead Channel Impedance Value: 592 Ohm
Lead Channel Impedance Value: 642 Ohm
Lead Channel Pacing Threshold Pulse Width: 0.4 ms
Lead Channel Setting Pacing Amplitude: 2.5 V
Lead Channel Setting Sensing Sensitivity: 2.8 mV
MDC IDC LEAD IMPLANT DT: 20020920
MDC IDC LEAD LOCATION: 753859
MDC IDC MSMT BATTERY REMAINING LONGEVITY: 24 mo
MDC IDC MSMT BATTERY VOLTAGE: 2.74 V
MDC IDC MSMT LEADCHNL RA PACING THRESHOLD AMPLITUDE: 0.5 V
MDC IDC MSMT LEADCHNL RA PACING THRESHOLD PULSEWIDTH: 0.4 ms
MDC IDC MSMT LEADCHNL RV PACING THRESHOLD AMPLITUDE: 0.75 V
MDC IDC SESS DTM: 20190221154412
MDC IDC SET LEADCHNL RA PACING AMPLITUDE: 2 V
MDC IDC SET LEADCHNL RV PACING PULSEWIDTH: 0.4 ms
MDC IDC STAT BRADY AP VS PERCENT: 0 %
MDC IDC STAT BRADY AS VP PERCENT: 67 %

## 2017-12-18 ENCOUNTER — Ambulatory Visit (INDEPENDENT_AMBULATORY_CARE_PROVIDER_SITE_OTHER): Payer: Medicare Other | Admitting: Internal Medicine

## 2017-12-18 ENCOUNTER — Encounter: Payer: Self-pay | Admitting: Internal Medicine

## 2017-12-18 ENCOUNTER — Other Ambulatory Visit (INDEPENDENT_AMBULATORY_CARE_PROVIDER_SITE_OTHER): Payer: Medicare Other

## 2017-12-18 VITALS — BP 124/72 | HR 84 | Temp 97.9°F | Ht 65.0 in | Wt 150.0 lb

## 2017-12-18 DIAGNOSIS — I10 Essential (primary) hypertension: Secondary | ICD-10-CM

## 2017-12-18 DIAGNOSIS — E7849 Other hyperlipidemia: Secondary | ICD-10-CM

## 2017-12-18 DIAGNOSIS — R7302 Impaired glucose tolerance (oral): Secondary | ICD-10-CM

## 2017-12-18 DIAGNOSIS — E038 Other specified hypothyroidism: Secondary | ICD-10-CM

## 2017-12-18 LAB — CBC WITH DIFFERENTIAL/PLATELET
BASOS ABS: 0.1 10*3/uL (ref 0.0–0.1)
Basophils Relative: 0.9 % (ref 0.0–3.0)
EOS ABS: 0.2 10*3/uL (ref 0.0–0.7)
Eosinophils Relative: 2.4 % (ref 0.0–5.0)
HEMATOCRIT: 40.7 % (ref 36.0–46.0)
HEMOGLOBIN: 13.9 g/dL (ref 12.0–15.0)
LYMPHS PCT: 28.2 % (ref 12.0–46.0)
Lymphs Abs: 1.8 10*3/uL (ref 0.7–4.0)
MCHC: 34.2 g/dL (ref 30.0–36.0)
MCV: 93.4 fl (ref 78.0–100.0)
MONOS PCT: 12.7 % — AB (ref 3.0–12.0)
Monocytes Absolute: 0.8 10*3/uL (ref 0.1–1.0)
Neutro Abs: 3.6 10*3/uL (ref 1.4–7.7)
Neutrophils Relative %: 55.8 % (ref 43.0–77.0)
Platelets: 275 10*3/uL (ref 150.0–400.0)
RBC: 4.36 Mil/uL (ref 3.87–5.11)
RDW: 13 % (ref 11.5–15.5)
WBC: 6.4 10*3/uL (ref 4.0–10.5)

## 2017-12-18 LAB — HEMOGLOBIN A1C: HEMOGLOBIN A1C: 6 % (ref 4.6–6.5)

## 2017-12-18 LAB — BASIC METABOLIC PANEL
BUN: 22 mg/dL (ref 6–23)
CALCIUM: 9 mg/dL (ref 8.4–10.5)
CO2: 28 mEq/L (ref 19–32)
Chloride: 101 mEq/L (ref 96–112)
Creatinine, Ser: 1.02 mg/dL (ref 0.40–1.20)
GFR: 53.72 mL/min — AB (ref >60.00)
Glucose, Bld: 98 mg/dL (ref 70–99)
Potassium: 3.5 mEq/L (ref 3.5–5.1)
Sodium: 138 mEq/L (ref 135–145)

## 2017-12-18 LAB — HEPATIC FUNCTION PANEL
ALBUMIN: 3.7 g/dL (ref 3.5–5.2)
ALK PHOS: 64 U/L (ref 39–117)
ALT: 14 U/L (ref 0–35)
AST: 18 U/L (ref 0–37)
BILIRUBIN DIRECT: 0.1 mg/dL (ref 0.0–0.3)
TOTAL PROTEIN: 6.9 g/dL (ref 6.0–8.3)
Total Bilirubin: 0.5 mg/dL (ref 0.2–1.2)

## 2017-12-18 LAB — LIPID PANEL
CHOLESTEROL: 145 mg/dL (ref 0–200)
HDL: 59 mg/dL (ref >39.00)
LDL CALC: 63 mg/dL (ref 0–99)
NonHDL: 85.57
Total CHOL/HDL Ratio: 2
Triglycerides: 115 mg/dL (ref 0.0–149.0)
VLDL: 23 mg/dL (ref 0.0–40.0)

## 2017-12-18 LAB — TSH: TSH: 3.56 u[IU]/mL (ref 0.35–4.50)

## 2017-12-18 MED ORDER — ZOSTER VAC RECOMB ADJUVANTED 50 MCG/0.5ML IM SUSR: 0.5000 mL | Freq: Once | INTRAMUSCULAR | 1 refills | Status: AC

## 2017-12-18 NOTE — Patient Instructions (Addendum)
Your Shingles shot prescription was sent to the pharmacy  Please continue all other medications as before, and refills have been done if requested.  Please have the pharmacy call with any other refills you may need.  Please continue your efforts at being more active, low cholesterol diet, and weight control.  You are otherwise up to date with prevention measures today.  Please keep your appointments with your specialists as you may have planned  Please go to the LAB in the Basement (turn left off the elevator) for the tests to be done today  You will be contacted by phone if any changes need to be made immediately.  Otherwise, you will receive a letter about your results with an explanation, but please check with MyChart first.  Please remember to sign up for MyChart if you have not done so, as this will be important to you in the future with finding out test results, communicating by private email, and scheduling acute appointments online when needed.  Please return in 6 months, or sooner if needed

## 2017-12-18 NOTE — Assessment & Plan Note (Signed)
stable overall by history and exam, recent data reviewed with pt, and pt to continue medical treatment as before,  to f/u any worsening symptoms or concerns Lab Results  Component Value Date   HGBA1C 6.0 (H) 01/18/2017

## 2017-12-18 NOTE — Progress Notes (Signed)
Subjective:    Patient ID: [name], female    DOB: [birth year], 84 y.o.   MRN: [id number]  HPI  Here for yearly f/u with family who gives hx;  Overall doing ok;  Pt denies Chest pain, worsening SOB, DOE, wheezing, orthopnea, PND, worsening LE edema, palpitations, dizziness or syncope.  Pt denies neurological change such as new headache, facial or extremity weakness.  Pt denies polydipsia, polyuria, or low sugar symptoms. Pt states overall good compliance with treatment and medications, good tolerability, and has been trying to follow appropriate diet.  Pt denies worsening depressive symptoms, suicidal ideation or panic No worsening behavior.. No fever, night sweats, wt loss, loss of appetite, or other constitutional symptoms.  Pt states good ability with ADL's, has low fall risk, home safety reviewed and adequate, no other significant changes in hearing or vision, and not active with exercise.  Walks with walker well.  Denies urinary symptoms such as dysuria, frequency, urgency, flank pain, hematuria or n/v, fever, chills.  No new complaints Past Medical History:  Diagnosis Date  . Aphasia 01/09/2009  . Arthritis    right hand  . 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 CEREBROVASCULAR ACCIDENT, HX OF 11/20/2007  . CHF (congestive heart failure) ([place])   . Coronary artery disease   . CVA 01/09/2009  . DEMENTIA 05/19/2008  . Dementia   . DIVERTICULOSIS, COLON 11/20/2007  . FATIGUE 11/20/2007  . Femoral fracture ([place]) 07/01/2013  . GLUCOSE INTOLERANCE 12/06/2010  . HYPERLIPIDEMIA 11/20/2007  . HYPERTENSION 11/20/2007   Echo was done 07/07/11: mod LVH, EF 55-60%, grade 1 diast dysfxn, mild MR, mild LAE, mod TR, PASP 39  . HYPOTHYROIDISM 11/20/2007  . Impaired glucose tolerance 06/03/2011  . IRON DEFICIENCY 12/06/2010  . Pacemaker 2002  . PACEMAKER, PERMANENT 01/09/2009  . TIA 05/14/2003   elevated CE's in setting of TIA and falls in 10/12 - echo normal with no further cardiac w/u pursued  . TIA  (transient ischemic attack) 10/10/2011  . Urinary incontinence 01/09/2009   Past Surgical History:  Procedure Laterality Date  . ABDOMINAL HYSTERECTOMY    . CHOLECYSTECTOMY    . CYSTOCELE REPAIR N/A 11/26/2012   Procedure: Cystocele Repair with Lefort Colpocleisis.  colpectomy Levatorplasty Perineorraphy;  Surgeon: Lovenia Kim, MD;  Location: [facility];  Service: Gynecology;  Laterality: N/A;  Cystocele Repair with Lefort Colpocleisis.  Marland Kitchen HIP ARTHROPLASTY Left 07/02/2013   Procedure: ARTHROPLASTY BIPOLAR HIP;  Surgeon: Mauri Pole, MD;  Location: [facility];  Service: Orthopedics;  Laterality: Left;  . INGUINAL HERNIA REPAIR Right 08/13/2013   Procedure: HERNIA REPAIR INGUINAL INCARCERATED;  Surgeon: Ralene Ok, MD;  Location: [facility];  Service: General;  Laterality: Right;  . Medtronic Cynthia dual chamber pacemaker serial number was [id number] H...04/15/2009    . s/p cerebral aneurysm  1991  . s/p pacemaker DDD    . s/p retinal attachment    . s/p thyroidectomy      reports that she has never smoked. She has never used smokeless tobacco. She reports that she does not drink alcohol or use drugs. family history includes Cancer in her mother. Allergies  Allergen Reactions  . Codeine     unknown   Current Outpatient Medications on File Prior to Visit  Medication Sig Dispense Refill  . ALPRAZolam (XANAX) 0.25 MG tablet TAKE 1/2 -1 TABLET BY MOUTH 2 TIMES DAILY AS NEEDED 40 tablet 1  . amLODipine (NORVASC) 10 MG tablet Take 1 tablet (10 mg total) by mouth daily.  Resume in 3 days if SBP > 150 90 tablet 3  . Calcium Carbonate-Vitamin D (CALTRATE 600+D) 600-400 MG-UNIT per tablet Take 1 tablet by mouth at bedtime.     . clopidogrel (PLAVIX) 75 MG tablet Take 1 tablet (75 mg total) by mouth daily. 90 tablet 3  . Docusate Sodium (DSS) 100 MG CAPS Take 100 mg by mouth every morning.     . donepezil (ARICEPT) 10 MG tablet Take 1 tablet (10 mg total) by mouth daily. 90 tablet 3  . feeding  supplement, ENSURE COMPLETE, (ENSURE COMPLETE) LIQD Take 237 mLs by mouth 2 (two) times daily between meals.    Marland Kitchen levothyroxine (SYNTHROID, LEVOTHROID) 125 MCG tablet Take 1 tablet (125 mcg total) by mouth daily. 90 tablet 3  . losartan (COZAAR) 100 MG tablet Take 1 tablet (100 mg total) by mouth daily. 90 tablet 3  . Multiple Vitamin (MULTIVITAMIN WITH MINERALS) TABS Take 1 tablet by mouth every morning. Centrum silver    . polyethylene glycol (MIRALAX / GLYCOLAX) packet Take 17 g by mouth daily as needed for mild constipation.    . polyvinyl alcohol (ARTIFICIAL TEARS) 1.4 % ophthalmic solution Place 1 drop into both eyes 4 (four) times daily.     . pravastatin (PRAVACHOL) 40 MG tablet Take 1 tablet (40 mg total) by mouth daily. 90 tablet 3   No current facility-administered medications on file prior to visit.    Review of Systems All otherwise neg per family - pt unable    Objective:   Physical Exam BP 124/72   Pulse 84   Temp 97.9 F (36.6 C) (Oral)   Ht 5\' 5"  (1.651 m)   Wt 150 lb (68 kg)   SpO2 96%   BMI 24.96 kg/m  VS noted,  Constitutional: Pt appears in NAD HENT: Head: NCAT.  Right Ear: External ear normal.  Left Ear: External ear normal.  Eyes: . Pupils are equal, round, and reactive to light. Conjunctivae and EOM are normal Nose: without d/c or deformity Neck: Neck supple. Gross normal ROM Cardiovascular: Normal rate and regular rhythm.   Pulmonary/Chest: Effort normal and breath sounds without rales or wheezing.  Abd:  Soft, NT, ND, + BS, no organomegaly Neurological: Pt is alert. At baseline orientation, motor grossly intact Skin: Skin is warm. No rashes, other new lesions, no LE edema Psychiatric: Pt behavior is normal without agitation , not agitated or depressed affect  No other exam findings    Assessment & Plan:

## 2017-12-18 NOTE — Progress Notes (Deleted)
Subjective:     Patient ID: [name], female   DOB: 09/29/1921, 84 y.o.   MRN: [id number]  HPI   Review of Systems     Objective:   Physical Exam     Assessment:     ***    Plan:     ***

## 2017-12-18 NOTE — Assessment & Plan Note (Signed)
stable overall by history and exam, recent data reviewed with pt, and pt to continue medical treatment as before,  to f/u any worsening symptoms or concerns,  BP Readings from Last 3 Encounters:  12/18/17 124/72  10/24/17 140/82  04/26/17 140/78

## 2017-12-18 NOTE — Assessment & Plan Note (Signed)
stable overall by history and exam, recent data reviewed with pt, and pt to continue medical treatment as before,  to f/u any worsening symptoms or concerns, for f/u labs 

## 2017-12-18 NOTE — Assessment & Plan Note (Signed)
stable overall by history and exam, recent data reviewed with pt, and pt to continue medical treatment as before,  to f/u any worsening symptoms or concerns, for f/ju lab

## 2018-02-13 ENCOUNTER — Ambulatory Visit (INDEPENDENT_AMBULATORY_CARE_PROVIDER_SITE_OTHER): Payer: Medicare Other | Admitting: *Deleted

## 2018-02-13 DIAGNOSIS — I442 Atrioventricular block, complete: Secondary | ICD-10-CM

## 2018-02-14 NOTE — Progress Notes (Signed)
Remote pacemaker transmission.   

## 2018-02-15 ENCOUNTER — Encounter: Payer: Self-pay | Admitting: Cardiology

## 2018-02-23 LAB — CUP PACEART REMOTE DEVICE CHECK
Battery Remaining Longevity: 21 mo
Brady Statistic AP VP Percent: 35 %
Brady Statistic AP VS Percent: 0 %
Brady Statistic AS VP Percent: 65 %
Implantable Lead Implant Date: 20020920
Implantable Lead Model: 5076
Implantable Pulse Generator Implant Date: 20100721
Lead Channel Impedance Value: 564 Ohm
Lead Channel Impedance Value: 625 Ohm
Lead Channel Pacing Threshold Amplitude: 0.5 V
Lead Channel Pacing Threshold Amplitude: 0.625 V
Lead Channel Pacing Threshold Pulse Width: 0.4 ms
Lead Channel Setting Pacing Amplitude: 2 V
Lead Channel Setting Pacing Pulse Width: 0.46 ms
MDC IDC LEAD IMPLANT DT: 20020920
MDC IDC LEAD LOCATION: 753859
MDC IDC LEAD LOCATION: 753860
MDC IDC MSMT BATTERY IMPEDANCE: 2675 Ohm
MDC IDC MSMT BATTERY VOLTAGE: 2.72 V
MDC IDC MSMT LEADCHNL RV PACING THRESHOLD PULSEWIDTH: 0.4 ms
MDC IDC SESS DTM: 20190521153051
MDC IDC SET LEADCHNL RV PACING AMPLITUDE: 2.5 V
MDC IDC SET LEADCHNL RV SENSING SENSITIVITY: 2.8 mV
MDC IDC STAT BRADY AS VS PERCENT: 0 %

## 2018-04-07 ENCOUNTER — Other Ambulatory Visit: Payer: Self-pay | Admitting: Internal Medicine

## 2018-04-16 ENCOUNTER — Telehealth: Payer: Self-pay | Admitting: Internal Medicine

## 2018-04-16 NOTE — Telephone Encounter (Signed)
Spoke with pt daughter in [employer], Wells Guiles.  Would like to know if pt can increase dose of Xanax? She is becoming more agitated with any type of change. 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 Pt is acusing others of stealing items. Wells Guiles also feels that the medicine patient is on for her memory needs to be reevaluated due to increase in memory loss.   Please advise and call Wells Guiles back at [phone]  Thank you, Allison-Care Guide, [facility]

## 2018-04-16 NOTE — Telephone Encounter (Signed)
Not appropriate for AWV

## 2018-04-17 MED ORDER — QUETIAPINE FUMARATE 50 MG PO TABS: 50.0000 mg | ORAL_TABLET | Freq: Two times a day (BID) | ORAL | 5 refills | Status: DC

## 2018-04-17 NOTE — Telephone Encounter (Signed)
I would not try to increase the xanax as this can lead to addiction and withdrawal issues if she is not able to get this at some point  We should add instead seroquel 50 mg twice per day as this often helps - done erx

## 2018-05-01 ENCOUNTER — Telehealth: Payer: Self-pay | Admitting: Emergency Medicine

## 2018-05-01 NOTE — Telephone Encounter (Signed)
Called patient to schedule AWV. Pts daughter stated she declined at this time.

## 2018-05-15 ENCOUNTER — Ambulatory Visit (INDEPENDENT_AMBULATORY_CARE_PROVIDER_SITE_OTHER): Payer: Medicare Other | Admitting: *Deleted

## 2018-05-15 DIAGNOSIS — I495 Sick sinus syndrome: Secondary | ICD-10-CM | POA: Diagnosis not present

## 2018-05-15 NOTE — Progress Notes (Signed)
Remote pacemaker transmission.   

## 2018-06-18 LAB — CUP PACEART REMOTE DEVICE CHECK
Battery Impedance: 3126 Ohm
Brady Statistic AP VS Percent: 0 %
Brady Statistic AS VS Percent: 0 %
Date Time Interrogation Session: 20190820152736
Implantable Lead Implant Date: 20020920
Implantable Lead Implant Date: 20020920
Implantable Lead Location: 753859
Implantable Lead Model: 5076
Lead Channel Impedance Value: 574 Ohm
Lead Channel Pacing Threshold Amplitude: 0.75 V
Lead Channel Pacing Threshold Pulse Width: 0.4 ms
Lead Channel Pacing Threshold Pulse Width: 0.4 ms
Lead Channel Setting Pacing Amplitude: 2 V
Lead Channel Setting Sensing Sensitivity: 2.8 mV
MDC IDC LEAD LOCATION: 753860
MDC IDC MSMT BATTERY REMAINING LONGEVITY: 18 mo
MDC IDC MSMT BATTERY VOLTAGE: 2.73 V
MDC IDC MSMT LEADCHNL RA IMPEDANCE VALUE: 639 Ohm
MDC IDC MSMT LEADCHNL RA PACING THRESHOLD AMPLITUDE: 0.5 V
MDC IDC MSMT LEADCHNL RA SENSING INTR AMPL: 2.8 mV
MDC IDC PG IMPLANT DT: 20100721
MDC IDC SET LEADCHNL RV PACING AMPLITUDE: 2.5 V
MDC IDC SET LEADCHNL RV PACING PULSEWIDTH: 0.4 ms
MDC IDC STAT BRADY AP VP PERCENT: 36 %
MDC IDC STAT BRADY AS VP PERCENT: 63 %

## 2018-06-21 ENCOUNTER — Ambulatory Visit (INDEPENDENT_AMBULATORY_CARE_PROVIDER_SITE_OTHER)
Admission: RE | Admit: 2018-06-21 | Discharge: 2018-06-21 | Disposition: A | Discharge status: Home / Self Care | Payer: Medicare Other | Source: Ambulatory Visit | Attending: Internal Medicine | Admitting: Internal Medicine

## 2018-06-21 ENCOUNTER — Encounter: Payer: Self-pay | Admitting: Internal Medicine

## 2018-06-21 ENCOUNTER — Other Ambulatory Visit (INDEPENDENT_AMBULATORY_CARE_PROVIDER_SITE_OTHER): Payer: Medicare Other

## 2018-06-21 ENCOUNTER — Ambulatory Visit (INDEPENDENT_AMBULATORY_CARE_PROVIDER_SITE_OTHER): Payer: Medicare Other | Admitting: Internal Medicine

## 2018-06-21 VITALS — BP 140/88 | HR 97 | Temp 97.5°F | Ht 65.0 in | Wt 151.0 lb

## 2018-06-21 DIAGNOSIS — M7989 Other specified soft tissue disorders: Secondary | ICD-10-CM | POA: Diagnosis not present

## 2018-06-21 DIAGNOSIS — M79671 Pain in right foot: Secondary | ICD-10-CM

## 2018-06-21 DIAGNOSIS — E038 Other specified hypothyroidism: Secondary | ICD-10-CM

## 2018-06-21 DIAGNOSIS — I10 Essential (primary) hypertension: Secondary | ICD-10-CM

## 2018-06-21 DIAGNOSIS — M25571 Pain in right ankle and joints of right foot: Secondary | ICD-10-CM

## 2018-06-21 DIAGNOSIS — S99911A Unspecified injury of right ankle, initial encounter: Secondary | ICD-10-CM | POA: Diagnosis not present

## 2018-06-21 DIAGNOSIS — Z23 Encounter for immunization: Secondary | ICD-10-CM | POA: Diagnosis not present

## 2018-06-21 DIAGNOSIS — R7302 Impaired glucose tolerance (oral): Secondary | ICD-10-CM

## 2018-06-21 DIAGNOSIS — S62616A Displaced fracture of proximal phalanx of right little finger, initial encounter for closed fracture: Secondary | ICD-10-CM | POA: Diagnosis not present

## 2018-06-21 LAB — URINALYSIS, ROUTINE W REFLEX MICROSCOPIC
Bilirubin Urine: NEGATIVE
Hgb urine dipstick: NEGATIVE
Ketones, ur: NEGATIVE
Leukocytes, UA: NEGATIVE
NITRITE: NEGATIVE
RBC / HPF: NONE SEEN (ref 0–?)
SPECIFIC GRAVITY, URINE: 1.01 (ref 1.000–1.030)
TOTAL PROTEIN, URINE-UPE24: NEGATIVE
Urine Glucose: NEGATIVE
Urobilinogen, UA: 0.2 (ref 0.0–1.0)
WBC, UA: NONE SEEN (ref 0–?)
pH: 7 (ref 5.0–8.0)

## 2018-06-21 LAB — CBC WITH DIFFERENTIAL/PLATELET
BASOS PCT: 1.3 % (ref 0.0–3.0)
Basophils Absolute: 0.1 10*3/uL (ref 0.0–0.1)
EOS PCT: 2.2 % (ref 0.0–5.0)
Eosinophils Absolute: 0.1 10*3/uL (ref 0.0–0.7)
HCT: 41.4 % (ref 36.0–46.0)
HEMOGLOBIN: 14.4 g/dL (ref 12.0–15.0)
Lymphocytes Relative: 24.2 % (ref 12.0–46.0)
Lymphs Abs: 1.6 10*3/uL (ref 0.7–4.0)
MCHC: 34.6 g/dL (ref 30.0–36.0)
MCV: 92.6 fl (ref 78.0–100.0)
MONOS PCT: 13.2 % — AB (ref 3.0–12.0)
Monocytes Absolute: 0.9 10*3/uL (ref 0.1–1.0)
Neutro Abs: 3.9 10*3/uL (ref 1.4–7.7)
Neutrophils Relative %: 59.1 % (ref 43.0–77.0)
Platelets: 241 10*3/uL (ref 150.0–400.0)
RBC: 4.47 Mil/uL (ref 3.87–5.11)
RDW: 12.9 % (ref 11.5–15.5)
WBC: 6.7 10*3/uL (ref 4.0–10.5)

## 2018-06-21 LAB — HEPATIC FUNCTION PANEL
ALT: 14 U/L (ref 0–35)
AST: 19 U/L (ref 0–37)
Albumin: 4 g/dL (ref 3.5–5.2)
Alkaline Phosphatase: 62 U/L (ref 39–117)
Bilirubin, Direct: 0.1 mg/dL (ref 0.0–0.3)
TOTAL PROTEIN: 7.1 g/dL (ref 6.0–8.3)
Total Bilirubin: 0.6 mg/dL (ref 0.2–1.2)

## 2018-06-21 LAB — BASIC METABOLIC PANEL
BUN: 20 mg/dL (ref 6–23)
CALCIUM: 9.5 mg/dL (ref 8.4–10.5)
CO2: 25 mEq/L (ref 19–32)
Chloride: 102 mEq/L (ref 96–112)
Creatinine, Ser: 1.04 mg/dL (ref 0.40–1.20)
GFR: 52.47 mL/min — AB (ref >60.00)
Glucose, Bld: 94 mg/dL (ref 70–99)
Potassium: 3.8 mEq/L (ref 3.5–5.1)
SODIUM: 137 meq/L (ref 135–145)

## 2018-06-21 LAB — HEMOGLOBIN A1C: HEMOGLOBIN A1C: 6 % (ref 4.6–6.5)
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
## 2018-06-21 NOTE — Patient Instructions (Addendum)
You had the flu shot today  Please continue all other medications as before, and refills have been done if requested.  Please have the pharmacy call with any other refills you may need.  Please continue your efforts at being more active, low cholesterol diet, and weight control.  You are otherwise up to date with prevention measures today.  Please keep your appointments with your specialists as you may have planned  Please go to the XRAY Department in the Basement (go straight as you get off the elevator) for the x-ray testing  Please go to the LAB in the Basement (turn left off the elevator) for the tests to be done today  You will be contacted by phone if any changes need to be made immediately.  Otherwise, you will receive a letter about your results with an explanation, but please check with MyChart first.  Please remember to sign up for MyChart if you have not done so, as this will be important to you in the future with finding out test results, communicating by private email, and scheduling acute appointments online when needed.

## 2018-06-22 ENCOUNTER — Telehealth: Payer: Self-pay | Admitting: Internal Medicine

## 2018-06-22 NOTE — Assessment & Plan Note (Signed)
stable overall by history and exam, recent data reviewed with pt, and pt to continue medical treatment as before,  to f/u any worsening symptoms or concern, for f/u lab

## 2018-06-22 NOTE — Telephone Encounter (Signed)
Notified daughter w/Md response.Marland KitchenJohny Chess

## 2018-06-22 NOTE — Telephone Encounter (Signed)
I have made an appointment for patient to see Dr. Raeford Razor on 10/1 at [facility] for her broke toes.  Daughter would like to know if there is anything the patient may need to be doing between now and then?  Such as taping the two toes together?

## 2018-06-22 NOTE — Assessment & Plan Note (Signed)
stable overall by history and exam, recent data reviewed with pt, and pt to continue medical treatment as before,  to f/u any worsening symptoms or concerns  

## 2018-06-22 NOTE — Telephone Encounter (Signed)
That would be a good idea but not too tightly to make more pain, only enough to stabilize  Of course the has 2  Broken toes, so the 4th toe should be buddy taped to 5th, and the third toe fracture should be buddy taped to the second toe

## 2018-06-22 NOTE — Assessment & Plan Note (Signed)
With large area bruising with foot and toe swelling as well, for foot films as well

## 2018-06-22 NOTE — Assessment & Plan Note (Signed)
?   Mild sprain, for ankle films

## 2018-06-26 ENCOUNTER — Ambulatory Visit: Payer: Self-pay

## 2018-06-26 ENCOUNTER — Encounter: Payer: Self-pay | Admitting: Family Medicine

## 2018-06-26 ENCOUNTER — Ambulatory Visit (INDEPENDENT_AMBULATORY_CARE_PROVIDER_SITE_OTHER): Payer: Medicare Other | Admitting: Family Medicine

## 2018-06-26 ENCOUNTER — Ambulatory Visit (INDEPENDENT_AMBULATORY_CARE_PROVIDER_SITE_OTHER): Payer: Medicare Other

## 2018-06-26 VITALS — BP 126/78 | HR 78 | Ht 65.0 in

## 2018-06-26 DIAGNOSIS — S92514A Nondisplaced fracture of proximal phalanx of right lesser toe(s), initial encounter for closed fracture: Secondary | ICD-10-CM | POA: Diagnosis not present

## 2018-06-26 DIAGNOSIS — M79674 Pain in right toe(s): Secondary | ICD-10-CM

## 2018-06-26 DIAGNOSIS — S92511A Displaced fracture of proximal phalanx of right lesser toe(s), initial encounter for closed fracture: Secondary | ICD-10-CM | POA: Diagnosis not present

## 2018-06-26 NOTE — Patient Instructions (Signed)
Nice to meet you  I will call with the results from today  Please see me back in 3 weeks.

## 2018-06-26 NOTE — Progress Notes (Signed)
[name] - 84 y.o. female MRN [id number]  Date of birth: Apr 02, 1922  SUBJECTIVE:  Including CC & ROS.  Chief Complaint  Patient presents with  . Toe Pain    [name] is a 84 y.o. female that is presenting with toe pain. Pain is located at her third and fourth toe on her right foot. She fell on 06/16/18. She has been keeping the toes taped up. Bruising still present. Swelling present in the lateral aspect of her right ankle. She has been taking tylenol for pain. She uses a walker for assistance daily.   Independent review of the foot x-ray from 9/26 shows a minimally displaced oblique fracture of the third and fourth proximal phalanges.   Review of Systems  Constitutional: Negative for fever.  HENT: Negative for congestion.   Respiratory: Negative for cough.   Cardiovascular: Negative for chest pain.  Gastrointestinal: Negative for abdominal pain.  Musculoskeletal: Positive for gait problem.  Skin: Negative for color change.  Neurological: Negative for weakness.  Hematological: Negative for adenopathy.    HISTORY: Past Medical, Surgical, Social, and Family History Reviewed & Updated per EMR.   Pertinent Historical Findings include:  Past Medical History:  Diagnosis Date  . Aphasia 01/09/2009  . Arthritis    right hand  . 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 CEREBROVASCULAR ACCIDENT, HX OF 11/20/2007  . CHF (congestive heart failure) ([place])   . Coronary artery disease   . CVA 01/09/2009  . DEMENTIA 05/19/2008  . Dementia ([place])   . DIVERTICULOSIS, COLON 11/20/2007  . FATIGUE 11/20/2007  . Femoral fracture ([place]) 07/01/2013  . GLUCOSE INTOLERANCE 12/06/2010  . HYPERLIPIDEMIA 11/20/2007  . HYPERTENSION 11/20/2007   Echo was done 07/07/11: mod LVH, EF 55-60%, grade 1 diast dysfxn, mild MR, mild LAE, mod TR, PASP 39  . HYPOTHYROIDISM 11/20/2007  . Impaired glucose tolerance 06/03/2011  . IRON DEFICIENCY 12/06/2010  . Pacemaker 2002  . PACEMAKER, PERMANENT 01/09/2009  . TIA  05/14/2003   elevated CE's in setting of TIA and falls in 10/12 - echo normal with no further cardiac w/u pursued  . TIA (transient ischemic attack) 10/10/2011  . Urinary incontinence 01/09/2009    Past Surgical History:  Procedure Laterality Date  . ABDOMINAL HYSTERECTOMY    . CHOLECYSTECTOMY    . CYSTOCELE REPAIR N/A 11/26/2012   Procedure: Cystocele Repair with Lefort Colpocleisis.  colpectomy Levatorplasty Perineorraphy;  Surgeon: Lovenia Kim, MD;  Location: [facility];  Service: Gynecology;  Laterality: N/A;  Cystocele Repair with Lefort Colpocleisis.  Marland Kitchen HIP ARTHROPLASTY Left 07/02/2013   Procedure: ARTHROPLASTY BIPOLAR HIP;  Surgeon: Mauri Pole, MD;  Location: [facility];  Service: Orthopedics;  Laterality: Left;  . INGUINAL HERNIA REPAIR Right 08/13/2013   Procedure: HERNIA REPAIR INGUINAL INCARCERATED;  Surgeon: Ralene Ok, MD;  Location: [facility];  Service: General;  Laterality: Right;  . Medtronic Cynthia dual chamber pacemaker serial number was [id number] H...04/15/2009    . s/p cerebral aneurysm  1991  . s/p pacemaker DDD    . s/p retinal attachment    . s/p thyroidectomy      Allergies  Allergen Reactions  . Codeine     unknown    Family History  Problem Relation Age of Onset  . Cancer Mother        breast cancer     Social History   Socioeconomic History  . Marital status: Divorced    Spouse name: Not on file  . Number of children: Not on  file  . Years of education: Not on file  . Highest education level: Not on file  Occupational History  . Occupation: retired Radiation protection practitioner, lives at [employer]  . Financial resource strain: Not on file  . Food insecurity:    Worry: Not on file    Inability: Not on file  . Transportation needs:    Medical: Not on file    Non-medical: Not on file  Tobacco Use  . Smoking status: Never Smoker  . Smokeless tobacco: Never Used  Substance and Sexual Activity  . Alcohol use: No  . Drug use: No  . Sexual  activity: Not on file  Lifestyle  . Physical activity:    Days per week: Not on file    Minutes per session: Not on file  . Stress: Not on file  Relationships  . Social connections:    Talks on phone: Not on file    Gets together: Not on file    Attends religious service: Not on file    Active member of club or organization: Not on file    Attends meetings of clubs or organizations: Not on file    Relationship status: Not on file  . Intimate partner violence:    Fear of current or ex partner: Not on file    Emotionally abused: Not on file    Physically abused: Not on file    Forced sexual activity: Not on file  Other Topics Concern  . Not on file  Social History Narrative  . Not on file     PHYSICAL EXAM:  VS: BP 126/78   Pulse 78   Ht 5\' 5"  (1.651 m)   SpO2 98%   BMI 25.13 kg/m  Physical Exam Gen: NAD, alert, cooperative with exam, well-appearing ENT: normal lips, normal nasal mucosa,  Eye: normal EOM, normal conjunctiva and lids CV:  no edema, +2 pedal pulses   Resp: no accessory muscle use, non-labored,  Skin: no rashes, no areas of induration  Neuro: normal tone, normal sensation to touch Psych:  normal insight, alert and oriented MSK:  Right foot:  Mild bruising and swelling of the dorsal component of the third and fourth proximal phalangeal the right foot. These are taped. Normal ankle range of motion. Tenderness to palpation over the dorsal component of the proximal phalanges of the third and fourth. Neurovascular intact     ASSESSMENT & PLAN:   Closed nondisplaced fracture of proximal phalanx of lesser toe of right foot Fracture after a trauma.   -X-ray today which does not show any further displacement. -We will continue buddy taping -Can follow-up in 3 weeks to monitor.

## 2018-06-27 NOTE — Assessment & Plan Note (Signed)
Fracture after a trauma.   -X-ray today which does not show any further displacement. -We will continue buddy taping -Can follow-up in 3 weeks to monitor.

## 2018-07-02 ENCOUNTER — Other Ambulatory Visit: Payer: Self-pay | Admitting: Internal Medicine

## 2018-07-17 ENCOUNTER — Encounter: Payer: Self-pay | Admitting: Family Medicine

## 2018-07-17 ENCOUNTER — Ambulatory Visit (INDEPENDENT_AMBULATORY_CARE_PROVIDER_SITE_OTHER): Payer: Medicare Other | Admitting: Family Medicine

## 2018-07-17 DIAGNOSIS — S92514D Nondisplaced fracture of proximal phalanx of right lesser toe(s), subsequent encounter for fracture with routine healing: Secondary | ICD-10-CM | POA: Diagnosis not present
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
## 2018-07-17 NOTE — Patient Instructions (Signed)
Good to see you  You can walk as your pain allows  You can follow up with me if your pain doesn't continue to improve.

## 2018-07-17 NOTE — Assessment & Plan Note (Addendum)
Having improvement in her symptoms. Mild pain.  - continue buddy taping  - can increase walking as pain allows  - f/u PRN

## 2018-08-14 ENCOUNTER — Telehealth: Payer: Self-pay

## 2018-08-14 NOTE — Telephone Encounter (Signed)
Confirmed remote transmission w/ pt daughter.  Pt daughter states she will send the transmission next week.

## 2018-08-16 ENCOUNTER — Encounter: Payer: Self-pay | Admitting: Cardiology

## 2018-08-21 ENCOUNTER — Telehealth: Payer: Self-pay | Admitting: Cardiology

## 2018-08-21 ENCOUNTER — Ambulatory Visit (INDEPENDENT_AMBULATORY_CARE_PROVIDER_SITE_OTHER): Payer: Medicare Other

## 2018-08-21 DIAGNOSIS — I495 Sick sinus syndrome: Secondary | ICD-10-CM | POA: Diagnosis not present

## 2018-08-21 DIAGNOSIS — I442 Atrioventricular block, complete: Secondary | ICD-10-CM

## 2018-08-21 NOTE — Telephone Encounter (Signed)
Confirmed remote transmission w/ pt daughter.   

## 2018-08-21 NOTE — Progress Notes (Signed)
Remote pacemaker transmission.   

## 2018-09-29 ENCOUNTER — Other Ambulatory Visit: Payer: Self-pay | Admitting: Internal Medicine

## 2018-10-04 DIAGNOSIS — R062 Wheezing: Secondary | ICD-10-CM | POA: Diagnosis not present

## 2018-10-04 DIAGNOSIS — J209 Acute bronchitis, unspecified: Secondary | ICD-10-CM | POA: Diagnosis not present

## 2018-10-11 ENCOUNTER — Encounter: Payer: Self-pay | Admitting: Internal Medicine

## 2018-10-11 ENCOUNTER — Ambulatory Visit (INDEPENDENT_AMBULATORY_CARE_PROVIDER_SITE_OTHER)
Admission: RE | Admit: 2018-10-11 | Discharge: 2018-10-11 | Disposition: A | Discharge status: Home / Self Care | Payer: Medicare Other | Source: Ambulatory Visit | Attending: Internal Medicine | Admitting: Internal Medicine

## 2018-10-11 ENCOUNTER — Ambulatory Visit (INDEPENDENT_AMBULATORY_CARE_PROVIDER_SITE_OTHER): Payer: Medicare Other | Admitting: Internal Medicine

## 2018-10-11 VITALS — BP 136/86 | HR 83 | Temp 98.3°F | Ht 65.0 in | Wt 150.0 lb

## 2018-10-11 DIAGNOSIS — R05 Cough: Secondary | ICD-10-CM | POA: Diagnosis not present

## 2018-10-11 DIAGNOSIS — I10 Essential (primary) hypertension: Secondary | ICD-10-CM | POA: Diagnosis not present

## 2018-10-11 DIAGNOSIS — R059 Cough, unspecified: Secondary | ICD-10-CM

## 2018-10-11 DIAGNOSIS — R7302 Impaired glucose tolerance (oral): Secondary | ICD-10-CM

## 2018-10-11 DIAGNOSIS — R0989 Other specified symptoms and signs involving the circulatory and respiratory systems: Secondary | ICD-10-CM

## 2018-10-11 DIAGNOSIS — R0689 Other abnormalities of breathing: Secondary | ICD-10-CM

## 2018-10-11 MED ORDER — BENZONATATE 100 MG PO CAPS: 100.0000 mg | ORAL_CAPSULE | Freq: Two times a day (BID) | ORAL | 1 refills | Status: DC | PRN

## 2018-10-11 MED ORDER — LEVOFLOXACIN 250 MG PO TABS: 250.0000 mg | ORAL_TABLET | Freq: Two times a day (BID) | ORAL | 0 refills | Status: DC

## 2018-10-11 MED ORDER — CEFTRIAXONE SODIUM 1 G IJ SOLR
1.0000 g | Freq: Once | INTRAMUSCULAR | Status: AC
  Administered 2018-10-11: 1 g via INTRAMUSCULAR

## 2018-10-11 NOTE — Assessment & Plan Note (Signed)
Cant r/o pna  - for cxr,  to f/u any worsening symptoms or concerns 

## 2018-10-11 NOTE — Assessment & Plan Note (Signed)
stable overall by history and exam, recent data reviewed with pt, and pt to continue medical treatment as before,  to f/u any worsening symptoms or concerns  

## 2018-10-11 NOTE — Assessment & Plan Note (Signed)
Mild to mod, c/w bronchitis vs pna, for cxr, also for antibx course, tessalon perle,  to f/u any worsening symptoms or concerns

## 2018-10-11 NOTE — Progress Notes (Signed)
Subjective:    Patient ID: [name], female    DOB: Nov 22, 1921, 85 y.o.   MRN: [id number]  HPI  Here with cough related acute illness onset jan 6, [facility] at [facility] jan 9 with tx with augmentin and tessalon perle, but now at end tx augmentin (7 days), granddaughter with her states she is still quite ill, essentially does not seem improved, Here with acute HA, general weakness and malaise, with prod cough scant greenish sputum, but Pt denies chest pain, increased sob or doe, wheezing, orthopnea, PND, increased LE swelling, palpitations, dizziness or syncope.  Pt denies new neurological symptoms such as new headache, or facial or extremity weakness or numbness   Pt denies polydipsia, polyuria.   Denies urinary symptoms such as dysuria, frequency, urgency, flank pain, hematuria or n/v, fever, chills. Past Medical History:  Diagnosis Date  . Aphasia 01/09/2009  . Arthritis    right hand  . 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 CEREBROVASCULAR ACCIDENT, HX OF 11/20/2007  . CHF (congestive heart failure) ([place])   . Coronary artery disease   . CVA 01/09/2009  . DEMENTIA 05/19/2008  . Dementia ([place])   . DIVERTICULOSIS, COLON 11/20/2007  . FATIGUE 11/20/2007  . Femoral fracture ([place]) 07/01/2013  . GLUCOSE INTOLERANCE 12/06/2010  . HYPERLIPIDEMIA 11/20/2007  . HYPERTENSION 11/20/2007   Echo was done 07/07/11: mod LVH, EF 55-60%, grade 1 diast dysfxn, mild MR, mild LAE, mod TR, PASP 39  . HYPOTHYROIDISM 11/20/2007  . Impaired glucose tolerance 06/03/2011  . IRON DEFICIENCY 12/06/2010  . Pacemaker 2002  . PACEMAKER, PERMANENT 01/09/2009  . TIA 05/14/2003   elevated CE's in setting of TIA and falls in 10/12 - echo normal with no further cardiac w/u pursued  . TIA (transient ischemic attack) 10/10/2011  . Urinary incontinence 01/09/2009   Past Surgical History:  Procedure Laterality Date  . ABDOMINAL HYSTERECTOMY    . CHOLECYSTECTOMY    . CYSTOCELE REPAIR N/A 11/26/2012   Procedure: Cystocele Repair with Lefort  Colpocleisis.  colpectomy Levatorplasty Perineorraphy;  Surgeon: Lovenia Kim, MD;  Location: [facility];  Service: Gynecology;  Laterality: N/A;  Cystocele Repair with Lefort Colpocleisis.  Marland Kitchen HIP ARTHROPLASTY Left 07/02/2013   Procedure: ARTHROPLASTY BIPOLAR HIP;  Surgeon: Mauri Pole, MD;  Location: [facility];  Service: Orthopedics;  Laterality: Left;  . INGUINAL HERNIA REPAIR Right 08/13/2013   Procedure: HERNIA REPAIR INGUINAL INCARCERATED;  Surgeon: Ralene Ok, MD;  Location: [facility];  Service: General;  Laterality: Right;  . Medtronic Cynthia dual chamber pacemaker serial number was [id number] H...04/15/2009    . s/p cerebral aneurysm  1991  . s/p pacemaker DDD    . s/p retinal attachment    . s/p thyroidectomy      reports that she has never smoked. She has never used smokeless tobacco. She reports that she does not drink alcohol or use drugs. family history includes Cancer in her mother. Allergies  Allergen Reactions  . Codeine     unknown   Current Outpatient Medications on File Prior to Visit  Medication Sig Dispense Refill  . ALPRAZolam (XANAX) 0.25 MG tablet TAKE 1/2 -1 TABLET BY MOUTH 2 TIMES DAILY AS NEEDED 40 tablet 1  . amLODipine (NORVASC) 10 MG tablet TAKE 1 TABLET BY MOUTH DAILY. RESUME IN 3 DAYS IS SYSTOLIC BLOOD PRESSURE IS GREATER THAN 150. 90 tablet 0  . Calcium Carbonate-Vitamin D (CALTRATE 600+D) 600-400 MG-UNIT per tablet Take 1 tablet by mouth at bedtime.     Marland Kitchen  clopidogrel (PLAVIX) 75 MG tablet TAKE 1 TABLET(75 MG) BY MOUTH DAILY 90 tablet 0  . Docusate Sodium (DSS) 100 MG CAPS Take 100 mg by mouth every morning.     . donepezil (ARICEPT) 10 MG tablet TAKE 1 TABLET(10 MG) BY MOUTH DAILY 90 tablet 0  . feeding supplement, ENSURE COMPLETE, (ENSURE COMPLETE) LIQD Take 237 mLs by mouth 2 (two) times daily between meals.    Marland Kitchen levothyroxine (SYNTHROID, LEVOTHROID) 125 MCG tablet TAKE 1 TABLET(125 MCG) BY MOUTH DAILY 90 tablet 0  . losartan (COZAAR) 100 MG tablet TAKE 1  TABLET(100 MG) BY MOUTH DAILY 90 tablet 0  . Multiple Vitamin (MULTIVITAMIN WITH MINERALS) TABS Take 1 tablet by mouth every morning. Centrum silver    . polyethylene glycol (MIRALAX / GLYCOLAX) packet Take 17 g by mouth daily as needed for mild constipation.    . polyvinyl alcohol (ARTIFICIAL TEARS) 1.4 % ophthalmic solution Place 1 drop into both eyes 4 (four) times daily.     . pravastatin (PRAVACHOL) 40 MG tablet TAKE 1 TABLET(40 MG) BY MOUTH DAILY 90 tablet 0   No current facility-administered medications on file prior to visit.    Review of Systems  Constitutional: Negative for other unusual diaphoresis or sweats HENT: Negative for ear discharge or swelling Eyes: Negative for other worsening visual disturbances Respiratory: Negative for stridor or other swelling  Gastrointestinal: Negative for worsening distension or other blood Genitourinary: Negative for retention or other urinary change Musculoskeletal: Negative for other MSK pain or swelling Skin: Negative for color change or other new lesions Neurological: Negative for worsening tremors and other numbness  Psychiatric/Behavioral: Negative for worsening agitation or other fatigue All other system neg per pt    Objective:   Physical Exam BP 136/86   Pulse 83   Temp 98.3 F (36.8 C) (Oral)   Ht 5\' 5"  (1.651 m)   Wt 150 lb (68 kg)   SpO2 96%   BMI 24.96 kg/m  VS noted, mild to mod ill,  Constitutional: Pt appears in NAD but fatigued, slow in movements HENT: Head: NCAT.  Right Ear: External ear normal.  Left Ear: External ear normal.  Bilat tm's with mild erythema.  Max sinus areas non tender.  Pharynx with mild erythema, no exudate Eyes: . Pupils are equal, round, and reactive to light. Conjunctivae and EOM are normal Nose: without d/c or deformity Neck: Neck supple. Gross normal ROM Cardiovascular: Normal rate and regular rhythm.   Pulmonary/Chest: Effort normal and breath sounds with few RLL rales, no wheezing.    Abd:  Soft, NT, ND, + BS, no organomegaly Neurological: Pt is alert. At baseline orientation, motor grossly intact Skin: Skin is warm. No rashes, other new lesions, no LE edema Psychiatric: Pt behavior is normal without agitation  No other exam findings Lab Results  Component Value Date   WBC 6.7 06/21/2018   HGB 14.4 06/21/2018   HCT 41.4 06/21/2018   PLT 241.0 06/21/2018   GLUCOSE 94 06/21/2018   CHOL 145 12/18/2017   TRIG 115.0 12/18/2017   HDL 59.00 12/18/2017   LDLDIRECT 73.4 11/18/2009   LDLCALC 63 12/18/2017   ALT 14 06/21/2018   AST 19 06/21/2018   NA 137 06/21/2018   K 3.8 06/21/2018   CL 102 06/21/2018   CREATININE 1.04 06/21/2018   BUN 20 06/21/2018   CO2 25 06/21/2018   TSH 3.56 12/18/2017   INR 1.06 07/06/2011   HGBA1C 6.0 06/21/2018        Assessment &  Plan:

## 2018-10-11 NOTE — Patient Instructions (Signed)
You had the antibiotic shot today  Please take all new medication as prescribed - the levaquin antibiotic, and cough pills as needed  You can also take Mucinex (or it's generic off brand) for congestion, and tylenol as needed for pain.  Please continue all other medications as before, and refills have been done if requested.  Please have the pharmacy call with any other refills you may need.  Please keep your appointments with your specialists as you may have planned  Please go to the XRAY Department in the Basement (go straight as you get off the elevator) for the x-ray testing  You will be contacted by phone if any changes need to be made immediately.  Otherwise, you will receive a letter about your results with an explanation, but please check with MyChart first.  Please remember to sign up for MyChart if you have not done so, as this will be important to you in the future with finding out test results, communicating by private email, and scheduling acute appointments online when needed.

## 2018-10-12 LAB — CUP PACEART REMOTE DEVICE CHECK
Battery Impedance: 3659 Ohm
Battery Voltage: 2.7 V
Brady Statistic AP VP Percent: 36 %
Brady Statistic AS VS Percent: 0 %
Date Time Interrogation Session: 20191126171825
Implantable Lead Implant Date: 20020920
Implantable Lead Location: 753859
Implantable Lead Model: 5076
Implantable Pulse Generator Implant Date: 20100721
Lead Channel Impedance Value: 566 Ohm
Lead Channel Impedance Value: 625 Ohm
Lead Channel Pacing Threshold Amplitude: 0.75 V
Lead Channel Pacing Threshold Pulse Width: 0.4 ms
Lead Channel Setting Pacing Amplitude: 2.5 V
Lead Channel Setting Sensing Sensitivity: 2.8 mV
MDC IDC LEAD IMPLANT DT: 20020920
MDC IDC LEAD LOCATION: 753860
MDC IDC MSMT BATTERY REMAINING LONGEVITY: 14 mo
MDC IDC MSMT LEADCHNL RA PACING THRESHOLD AMPLITUDE: 0.5 V
MDC IDC MSMT LEADCHNL RV PACING THRESHOLD PULSEWIDTH: 0.4 ms
MDC IDC SET LEADCHNL RA PACING AMPLITUDE: 2 V
MDC IDC SET LEADCHNL RV PACING PULSEWIDTH: 0.4 ms
MDC IDC STAT BRADY AP VS PERCENT: 0 %
MDC IDC STAT BRADY AS VP PERCENT: 64 %

## 2018-10-18 ENCOUNTER — Encounter: Payer: Self-pay | Admitting: Internal Medicine

## 2018-11-06 ENCOUNTER — Ambulatory Visit (INDEPENDENT_AMBULATORY_CARE_PROVIDER_SITE_OTHER): Payer: Medicare Other | Admitting: Internal Medicine

## 2018-11-06 ENCOUNTER — Encounter: Payer: Self-pay | Admitting: Internal Medicine

## 2018-11-06 VITALS — BP 142/76 | HR 95 | Ht 65.0 in | Wt 149.0 lb

## 2018-11-06 DIAGNOSIS — Z950 Presence of cardiac pacemaker: Secondary | ICD-10-CM

## 2018-11-06 DIAGNOSIS — I442 Atrioventricular block, complete: Secondary | ICD-10-CM | POA: Diagnosis not present

## 2018-11-06 DIAGNOSIS — I480 Paroxysmal atrial fibrillation: Secondary | ICD-10-CM | POA: Diagnosis not present

## 2018-11-06 NOTE — Progress Notes (Signed)
HPI [name] is a pleasantly demented 85 yo woman with chronic atrial fib, CHB, s/p stroke, with diastolic heart failure. In the interim, she has a h/o cerebral aneurysm and not a candidate for systemic anti-coagulation.  Allergies  Allergen Reactions  . Codeine     unknown     Current Outpatient Medications  Medication Sig Dispense Refill  . amLODipine (NORVASC) 10 MG tablet TAKE 1 TABLET BY MOUTH DAILY. RESUME IN 3 DAYS IS SYSTOLIC BLOOD PRESSURE IS GREATER THAN 150. 90 tablet 0  . Calcium Carbonate-Vitamin D (CALTRATE 600+D) 600-400 MG-UNIT per tablet Take 1 tablet by mouth at bedtime.     . clopidogrel (PLAVIX) 75 MG tablet TAKE 1 TABLET(75 MG) BY MOUTH DAILY 90 tablet 0  . Docusate Sodium (DSS) 100 MG CAPS Take 100 mg by mouth every morning.     . donepezil (ARICEPT) 10 MG tablet TAKE 1 TABLET(10 MG) BY MOUTH DAILY 90 tablet 0  . feeding supplement, ENSURE COMPLETE, (ENSURE COMPLETE) LIQD Take 237 mLs by mouth 2 (two) times daily between meals.    Marland Kitchen levothyroxine (SYNTHROID, LEVOTHROID) 125 MCG tablet TAKE 1 TABLET(125 MCG) BY MOUTH DAILY 90 tablet 0  . losartan (COZAAR) 100 MG tablet TAKE 1 TABLET(100 MG) BY MOUTH DAILY 90 tablet 0  . Multiple Vitamin (MULTIVITAMIN WITH MINERALS) TABS Take 1 tablet by mouth every morning. Centrum silver    . polyethylene glycol (MIRALAX / GLYCOLAX) packet Take 17 g by mouth daily as needed for mild constipation.    . polyvinyl alcohol (ARTIFICIAL TEARS) 1.4 % ophthalmic solution Place 1 drop into both eyes 4 (four) times daily.     . pravastatin (PRAVACHOL) 40 MG tablet TAKE 1 TABLET(40 MG) BY MOUTH DAILY 90 tablet 0  . ALPRAZolam (XANAX) 0.25 MG tablet TAKE 1/2 -1 TABLET BY MOUTH 2 TIMES DAILY AS NEEDED (Patient not taking: Reported on 11/06/2018) 40 tablet 1   No current facility-administered medications for this visit.      Past Medical History:  Diagnosis Date  . Aphasia 01/09/2009  . Arthritis    right hand  . AV BLOCK,  COMPLETE 01/09/2009  . CEREBROVASCULAR ACCIDENT, HX OF 11/20/2007  . CHF (congestive heart failure) ([place])   . Coronary artery disease   . CVA 01/09/2009  . DEMENTIA 05/19/2008  . Dementia ([place])   . DIVERTICULOSIS, COLON 11/20/2007  . FATIGUE 11/20/2007  . Femoral fracture ([place]) 07/01/2013  . GLUCOSE INTOLERANCE 12/06/2010  . HYPERLIPIDEMIA 11/20/2007  . HYPERTENSION 11/20/2007   Echo was done 07/07/11: mod LVH, EF 55-60%, grade 1 diast dysfxn, mild MR, mild LAE, mod TR, PASP 39  . HYPOTHYROIDISM 11/20/2007  . Impaired glucose tolerance 06/03/2011  . IRON DEFICIENCY 12/06/2010  . Pacemaker 2002  . PACEMAKER, PERMANENT 01/09/2009  . TIA 05/14/2003   elevated CE's in setting of TIA and falls in 10/12 - echo normal with no further cardiac w/u pursued  . TIA (transient ischemic attack) 10/10/2011  . Urinary incontinence 01/09/2009    ROS:   All systems reviewed and negative except as noted in the HPI.   Past Surgical History:  Procedure Laterality Date  . ABDOMINAL HYSTERECTOMY    . CHOLECYSTECTOMY    . CYSTOCELE REPAIR N/A 11/26/2012   Procedure: Cystocele Repair with Lefort Colpocleisis.  colpectomy Levatorplasty Perineorraphy;  Surgeon: Lovenia Kim, MD;  Location: [facility];  Service: Gynecology;  Laterality: N/A;  Cystocele Repair with Lefort Colpocleisis.  Marland Kitchen HIP ARTHROPLASTY Left 07/02/2013  Procedure: ARTHROPLASTY BIPOLAR HIP;  Surgeon: Mauri Pole, MD;  Location: [facility];  Service: Orthopedics;  Laterality: Left;  . INGUINAL HERNIA REPAIR Right 08/13/2013   Procedure: HERNIA REPAIR INGUINAL INCARCERATED;  Surgeon: Ralene Ok, MD;  Location: [facility];  Service: General;  Laterality: Right;  . Medtronic Cynthia dual chamber pacemaker serial number was [id number] H...04/15/2009    . s/p cerebral aneurysm  1991  . s/p pacemaker DDD    . s/p retinal attachment    . s/p thyroidectomy       Family History  Problem Relation Age of Onset  . Cancer Mother        breast cancer      Social History   Socioeconomic History  . Marital status: Divorced    Spouse name: Not on file  . Number of children: Not on file  . Years of education: Not on file  . Highest education level: Not on file  Occupational History  . Occupation: retired Radiation protection practitioner, lives at [employer]  . Financial resource strain: Not on file  . Food insecurity:    Worry: Not on file    Inability: Not on file  . Transportation needs:    Medical: Not on file    Non-medical: Not on file  Tobacco Use  . Smoking status: Never Smoker  . Smokeless tobacco: Never Used  Substance and Sexual Activity  . Alcohol use: No  . Drug use: No  . Sexual activity: Not on file  Lifestyle  . Physical activity:    Days per week: Not on file    Minutes per session: Not on file  . Stress: Not on file  Relationships  . Social connections:    Talks on phone: Not on file    Gets together: Not on file    Attends religious service: Not on file    Active member of club or organization: Not on file    Attends meetings of clubs or organizations: Not on file    Relationship status: Not on file  . Intimate partner violence:    Fear of current or ex partner: Not on file    Emotionally abused: Not on file    Physically abused: Not on file    Forced sexual activity: Not on file  Other Topics Concern  . Not on file  Social History Narrative  . Not on file     BP (!) 142/76   Pulse 95   Ht 5\' 5"  (1.651 m)   Wt 149 lb (67.6 kg)   BMI 24.79 kg/m   Physical Exam:  Well appearing NAD HEENT: Unremarkable Neck:  No JVD, no thyromegally Lymphatics:  No adenopathy Back:  No CVA tenderness Lungs:  Clear with no wheezes HEART:  Regular rate rhythm, no murmurs, no rubs, no clicks Abd:  soft, positive bowel sounds, no organomegally, no rebound, no guarding Ext:  2 plus pulses, no edema, no cyanosis, no clubbing Skin:  No rashes no nodules Neuro:  CN II through XII intact, motor grossly  intact  EKG - NSR with AV pacing  DEVICE  Normal device function.  See PaceArt for details.   Assess/Plan: 1. Atrial fib - she is in NSR about 98% of the time. She is asymptomatic.  2. CHB - she is asymptomatic s/p PPM. She has no escape. 3. PPM - her device has less than a year of longevity. I discussed PM gen change out with her family.  4. HTN - her blood  pressure is reasonably well controlled. No change.   Mikle Bosworth.D.

## 2018-11-06 NOTE — Patient Instructions (Signed)
Medication Instructions:  Your physician recommends that you continue on your current medications as directed. Please refer to the Current Medication list given to you today.  Labwork: None ordered.  Testing/Procedures: None ordered.  Follow-Up: Your physician wants you to follow-up in: one year with Dr. Lovena Le.   You will receive a reminder letter in the mail two months in advance. If you don't receive a letter, please call our office to schedule the follow-up appointment.  Remote monitoring is used to monitor your Pacemaker from home. This monitoring reduces the number of office visits required to check your device to one time per year. It allows [place] to keep an eye on the functioning of your device to ensure it is working properly. You are scheduled for a device check from home on 11/20/2018. You may send your transmission at any time that day. If you have a wireless device, the transmission will be sent automatically. After your physician reviews your transmission, you will receive a postcard with your next transmission date.  Any Other Special Instructions Will Be Listed Below (If Applicable).  If you need a refill on your cardiac medications before your next appointment, please call your pharmacy.

## 2018-11-13 LAB — CUP PACEART INCLINIC DEVICE CHECK
Implantable Lead Implant Date: 20020920
Implantable Lead Implant Date: 20020920
Implantable Lead Location: 753860
Implantable Lead Model: 5076
Implantable Lead Model: 5076
Implantable Pulse Generator Implant Date: 20100721
MDC IDC LEAD LOCATION: 753859
MDC IDC SESS DTM: 20200218100937

## 2018-11-20 ENCOUNTER — Ambulatory Visit (INDEPENDENT_AMBULATORY_CARE_PROVIDER_SITE_OTHER): Payer: Medicare Other | Admitting: *Deleted

## 2018-11-20 DIAGNOSIS — I495 Sick sinus syndrome: Secondary | ICD-10-CM | POA: Diagnosis not present

## 2018-11-21 LAB — CUP PACEART REMOTE DEVICE CHECK
Brady Statistic AP VP Percent: 25 %
Brady Statistic AS VS Percent: 0 %
Date Time Interrogation Session: 20200225162114
Implantable Lead Implant Date: 20020920
Implantable Lead Location: 753859
Implantable Pulse Generator Implant Date: 20100721
Lead Channel Impedance Value: 510 Ohm
Lead Channel Pacing Threshold Amplitude: 0.75 V
Lead Channel Pacing Threshold Pulse Width: 0.4 ms
Lead Channel Setting Sensing Sensitivity: 2.8 mV
MDC IDC LEAD IMPLANT DT: 20020920
MDC IDC LEAD LOCATION: 753860
MDC IDC MSMT BATTERY IMPEDANCE: 4558 Ohm
MDC IDC MSMT BATTERY REMAINING LONGEVITY: 9 mo
MDC IDC MSMT BATTERY VOLTAGE: 2.67 V
MDC IDC MSMT LEADCHNL RA IMPEDANCE VALUE: 639 Ohm
MDC IDC MSMT LEADCHNL RA PACING THRESHOLD AMPLITUDE: 0.5 V
MDC IDC MSMT LEADCHNL RA PACING THRESHOLD PULSEWIDTH: 0.4 ms
MDC IDC SET LEADCHNL RA PACING AMPLITUDE: 2 V
MDC IDC SET LEADCHNL RV PACING AMPLITUDE: 2.5 V
MDC IDC SET LEADCHNL RV PACING PULSEWIDTH: 0.4 ms
MDC IDC STAT BRADY AP VS PERCENT: 0 %
MDC IDC STAT BRADY AS VP PERCENT: 75 %

## 2018-11-27 ENCOUNTER — Encounter: Payer: Self-pay | Admitting: Cardiology

## 2018-11-27 NOTE — Progress Notes (Signed)
Remote pacemaker transmission.   

## 2018-12-03 ENCOUNTER — Emergency Department (HOSPITAL_COMMUNITY): Payer: Medicare Other

## 2018-12-03 ENCOUNTER — Telehealth: Payer: Self-pay | Admitting: Internal Medicine

## 2018-12-03 ENCOUNTER — Inpatient Hospital Stay (HOSPITAL_COMMUNITY): Payer: Medicare Other

## 2018-12-03 ENCOUNTER — Inpatient Hospital Stay (HOSPITAL_COMMUNITY)
Admission: EM | Admit: 2018-12-03 | Discharge: 2018-12-07 | DRG: 061 | Disposition: A | Discharge status: Rehabilitation Facility | Payer: Medicare Other | Source: Skilled Nursing Facility | Attending: Neurology | Admitting: Neurology

## 2018-12-03 ENCOUNTER — Encounter (HOSPITAL_COMMUNITY): Payer: Self-pay | Admitting: Emergency Medicine

## 2018-12-03 ENCOUNTER — Other Ambulatory Visit: Payer: Self-pay

## 2018-12-03 ENCOUNTER — Encounter (HOSPITAL_COMMUNITY): Payer: Medicare Other

## 2018-12-03 DIAGNOSIS — I619 Nontraumatic intracerebral hemorrhage, unspecified: Secondary | ICD-10-CM | POA: Diagnosis present

## 2018-12-03 DIAGNOSIS — I63512 Cerebral infarction due to unspecified occlusion or stenosis of left middle cerebral artery: Secondary | ICD-10-CM

## 2018-12-03 DIAGNOSIS — I340 Nonrheumatic mitral (valve) insufficiency: Secondary | ICD-10-CM

## 2018-12-03 DIAGNOSIS — I10 Essential (primary) hypertension: Secondary | ICD-10-CM | POA: Diagnosis not present

## 2018-12-03 DIAGNOSIS — I2510 Atherosclerotic heart disease of native coronary artery without angina pectoris: Secondary | ICD-10-CM | POA: Diagnosis present

## 2018-12-03 DIAGNOSIS — G8191 Hemiplegia, unspecified affecting right dominant side: Secondary | ICD-10-CM | POA: Diagnosis present

## 2018-12-03 DIAGNOSIS — R4701 Aphasia: Secondary | ICD-10-CM | POA: Diagnosis not present

## 2018-12-03 DIAGNOSIS — R29810 Facial weakness: Secondary | ICD-10-CM | POA: Diagnosis present

## 2018-12-03 DIAGNOSIS — M19041 Primary osteoarthritis, right hand: Secondary | ICD-10-CM | POA: Diagnosis present

## 2018-12-03 DIAGNOSIS — I69320 Aphasia following cerebral infarction: Secondary | ICD-10-CM | POA: Diagnosis not present

## 2018-12-03 DIAGNOSIS — R531 Weakness: Secondary | ICD-10-CM | POA: Diagnosis not present

## 2018-12-03 DIAGNOSIS — I69391 Dysphagia following cerebral infarction: Secondary | ICD-10-CM | POA: Diagnosis not present

## 2018-12-03 DIAGNOSIS — E611 Iron deficiency: Secondary | ICD-10-CM | POA: Diagnosis present

## 2018-12-03 DIAGNOSIS — I63521 Cerebral infarction due to unspecified occlusion or stenosis of right anterior cerebral artery: Secondary | ICD-10-CM | POA: Diagnosis not present

## 2018-12-03 DIAGNOSIS — I083 Combined rheumatic disorders of mitral, aortic and tricuspid valves: Secondary | ICD-10-CM | POA: Diagnosis present

## 2018-12-03 DIAGNOSIS — I6523 Occlusion and stenosis of bilateral carotid arteries: Secondary | ICD-10-CM | POA: Diagnosis present

## 2018-12-03 DIAGNOSIS — Z96642 Presence of left artificial hip joint: Secondary | ICD-10-CM | POA: Diagnosis present

## 2018-12-03 DIAGNOSIS — R7302 Impaired glucose tolerance (oral): Secondary | ICD-10-CM | POA: Diagnosis present

## 2018-12-03 DIAGNOSIS — I63522 Cerebral infarction due to unspecified occlusion or stenosis of left anterior cerebral artery: Principal | ICD-10-CM | POA: Diagnosis present

## 2018-12-03 DIAGNOSIS — I509 Heart failure, unspecified: Secondary | ICD-10-CM | POA: Diagnosis present

## 2018-12-03 DIAGNOSIS — E039 Hypothyroidism, unspecified: Secondary | ICD-10-CM | POA: Diagnosis present

## 2018-12-03 DIAGNOSIS — I4891 Unspecified atrial fibrillation: Secondary | ICD-10-CM | POA: Diagnosis present

## 2018-12-03 DIAGNOSIS — G4730 Sleep apnea, unspecified: Secondary | ICD-10-CM | POA: Diagnosis present

## 2018-12-03 DIAGNOSIS — I63422 Cerebral infarction due to embolism of left anterior cerebral artery: Secondary | ICD-10-CM | POA: Diagnosis not present

## 2018-12-03 DIAGNOSIS — Z8673 Personal history of transient ischemic attack (TIA), and cerebral infarction without residual deficits: Secondary | ICD-10-CM

## 2018-12-03 DIAGNOSIS — Z90710 Acquired absence of both cervix and uterus: Secondary | ICD-10-CM

## 2018-12-03 DIAGNOSIS — I110 Hypertensive heart disease with heart failure: Secondary | ICD-10-CM | POA: Diagnosis present

## 2018-12-03 DIAGNOSIS — Z7902 Long term (current) use of antithrombotics/antiplatelets: Secondary | ICD-10-CM

## 2018-12-03 DIAGNOSIS — R0902 Hypoxemia: Secondary | ICD-10-CM | POA: Diagnosis not present

## 2018-12-03 DIAGNOSIS — E876 Hypokalemia: Secondary | ICD-10-CM | POA: Diagnosis present

## 2018-12-03 DIAGNOSIS — I639 Cerebral infarction, unspecified: Secondary | ICD-10-CM | POA: Diagnosis not present

## 2018-12-03 DIAGNOSIS — R471 Dysarthria and anarthria: Secondary | ICD-10-CM | POA: Diagnosis present

## 2018-12-03 DIAGNOSIS — E785 Hyperlipidemia, unspecified: Secondary | ICD-10-CM | POA: Diagnosis present

## 2018-12-03 DIAGNOSIS — Z950 Presence of cardiac pacemaker: Secondary | ICD-10-CM

## 2018-12-03 DIAGNOSIS — Z803 Family history of malignant neoplasm of breast: Secondary | ICD-10-CM

## 2018-12-03 DIAGNOSIS — Z79899 Other long term (current) drug therapy: Secondary | ICD-10-CM

## 2018-12-03 DIAGNOSIS — R29715 NIHSS score 15: Secondary | ICD-10-CM | POA: Diagnosis present

## 2018-12-03 DIAGNOSIS — R410 Disorientation, unspecified: Secondary | ICD-10-CM | POA: Diagnosis not present

## 2018-12-03 DIAGNOSIS — Z23 Encounter for immunization: Secondary | ICD-10-CM | POA: Diagnosis present

## 2018-12-03 DIAGNOSIS — F0390 Unspecified dementia without behavioral disturbance: Secondary | ICD-10-CM | POA: Diagnosis present

## 2018-12-03 DIAGNOSIS — R404 Transient alteration of awareness: Secondary | ICD-10-CM | POA: Diagnosis not present

## 2018-12-03 DIAGNOSIS — J9811 Atelectasis: Secondary | ICD-10-CM | POA: Diagnosis present

## 2018-12-03 DIAGNOSIS — I480 Paroxysmal atrial fibrillation: Secondary | ICD-10-CM | POA: Diagnosis not present

## 2018-12-03 DIAGNOSIS — R0602 Shortness of breath: Secondary | ICD-10-CM | POA: Diagnosis not present

## 2018-12-03 DIAGNOSIS — I69319 Unspecified symptoms and signs involving cognitive functions following cerebral infarction: Secondary | ICD-10-CM | POA: Diagnosis not present

## 2018-12-03 DIAGNOSIS — Z8659 Personal history of other mental and behavioral disorders: Secondary | ICD-10-CM | POA: Diagnosis not present

## 2018-12-03 DIAGNOSIS — R1310 Dysphagia, unspecified: Secondary | ICD-10-CM | POA: Diagnosis present

## 2018-12-03 DIAGNOSIS — Z79890 Hormone replacement therapy (postmenopausal): Secondary | ICD-10-CM

## 2018-12-03 DIAGNOSIS — R1312 Dysphagia, oropharyngeal phase: Secondary | ICD-10-CM

## 2018-12-03 DIAGNOSIS — Z9049 Acquired absence of other specified parts of digestive tract: Secondary | ICD-10-CM

## 2018-12-03 DIAGNOSIS — R414 Neurologic neglect syndrome: Secondary | ICD-10-CM | POA: Diagnosis present

## 2018-12-03 LAB — DIFFERENTIAL
Abs Immature Granulocytes: 0.03 10*3/uL (ref 0.00–0.07)
Basophils Absolute: 0 10*3/uL (ref 0.0–0.1)
Basophils Relative: 0 %
EOS ABS: 0 10*3/uL (ref 0.0–0.5)
Eosinophils Relative: 1 %
Immature Granulocytes: 1 %
LYMPHS ABS: 0.8 10*3/uL (ref 0.7–4.0)
Lymphocytes Relative: 13 %
Monocytes Absolute: 0.7 10*3/uL (ref 0.1–1.0)
Monocytes Relative: 13 %
Neutro Abs: 4.3 10*3/uL (ref 1.7–7.7)
Neutrophils Relative %: 72 %

## 2018-12-03 LAB — CBC
HCT: 39.5 % (ref 36.0–46.0)
HCT: 38 % (ref 36.0–46.0)
HEMOGLOBIN: 12.4 g/dL (ref 12.0–15.0)
Hemoglobin: 12.9 g/dL (ref 12.0–15.0)
MCH: 31.5 pg (ref 26.0–34.0)
MCH: 31.3 pg (ref 26.0–34.0)
MCHC: 32.7 g/dL (ref 30.0–36.0)
MCHC: 32.6 g/dL (ref 30.0–36.0)
MCV: 96.3 fL (ref 80.0–100.0)
MCV: 96 fL (ref 80.0–100.0)
PLATELETS: 224 10*3/uL (ref 150–400)
Platelets: 220 10*3/uL (ref 150–400)
RBC: 4.1 MIL/uL (ref 3.87–5.11)
RBC: 3.96 MIL/uL (ref 3.87–5.11)
RDW: 12.8 % (ref 11.5–15.5)
RDW: 12.8 % (ref 11.5–15.5)
WBC: 5.8 10*3/uL (ref 4.0–10.5)
WBC: 6 10*3/uL (ref 4.0–10.5)
nRBC: 0 % (ref 0.0–0.2)
nRBC: 0 % (ref 0.0–0.2)

## 2018-12-03 LAB — COMPREHENSIVE METABOLIC PANEL
ALT: 21 U/L (ref 0–44)
AST: 24 U/L (ref 15–41)
Albumin: 3.5 g/dL (ref 3.5–5.0)
Alkaline Phosphatase: 58 U/L (ref 38–126)
Anion gap: 12 (ref 5–15)
BILIRUBIN TOTAL: 0.7 mg/dL (ref 0.3–1.2)
BUN: 35 mg/dL — AB (ref 8–23)
CO2: 23 mmol/L (ref 22–32)
Calcium: 8.3 mg/dL — ABNORMAL LOW (ref 8.9–10.3)
Chloride: 101 mmol/L (ref 98–111)
Creatinine, Ser: 1.12 mg/dL — ABNORMAL HIGH (ref 0.44–1.00)
GFR calc Af Amer: 49 mL/min — ABNORMAL LOW (ref >60)
GFR, EST NON AFRICAN AMERICAN: 42 mL/min — AB (ref >60)
Glucose, Bld: 151 mg/dL — ABNORMAL HIGH (ref 70–99)
POTASSIUM: 3.1 mmol/L — AB (ref 3.5–5.1)
Sodium: 136 mmol/L (ref 135–145)
Total Protein: 6.4 g/dL — ABNORMAL LOW (ref 6.5–8.1)

## 2018-12-03 LAB — HEMOGLOBIN A1C
Hgb A1c MFr Bld: 5.6 % (ref 4.8–5.6)
Mean Plasma Glucose: 114.02 mg/dL

## 2018-12-03 LAB — LIPID PANEL
Cholesterol: 101 mg/dL (ref 0–200)
HDL: 40 mg/dL — ABNORMAL LOW (ref >40)
LDL CALC: 48 mg/dL (ref 0–99)
Total CHOL/HDL Ratio: 2.5 RATIO
Triglycerides: 66 mg/dL (ref <150)
VLDL: 13 mg/dL (ref 0–40)

## 2018-12-03 LAB — MRSA PCR SCREENING: MRSA by PCR: NEGATIVE

## 2018-12-03 LAB — ECHOCARDIOGRAM COMPLETE
Height: 65 in
Weight: 2455.04 oz

## 2018-12-03 LAB — PROTIME-INR
INR: 1.1 (ref 0.8–1.2)
Prothrombin Time: 13.7 seconds (ref 11.4–15.2)

## 2018-12-03 LAB — ETHANOL: Alcohol, Ethyl (B): <10 mg/dL (ref <10)

## 2018-12-03 LAB — I-STAT CREATININE, ED: Creatinine, Ser: 1.1 mg/dL — ABNORMAL HIGH (ref 0.44–1.00)

## 2018-12-03 LAB — CBG MONITORING, ED: GLUCOSE-CAPILLARY: 134 mg/dL — AB (ref 70–99)

## 2018-12-03 LAB — APTT: aPTT: 35 seconds (ref 24–36)

## 2018-12-03 MED ORDER — NICARDIPINE HCL IN NACL 20-0.86 MG/200ML-% IV SOLN
0.0000 mg/h | INTRAVENOUS | Status: DC | PRN
  Filled 2018-12-03: qty 200

## 2018-12-03 MED ORDER — LABETALOL HCL 5 MG/ML IV SOLN: 10.0000 mg | Freq: Once | INTRAVENOUS | Status: DC | PRN

## 2018-12-03 MED ORDER — ALTEPLASE (STROKE) FULL DOSE INFUSION
0.9000 mg/kg | Freq: Once | INTRAVENOUS | Status: AC
  Administered 2018-12-03: 62.6 mg via INTRAVENOUS

## 2018-12-03 MED ORDER — PRAVASTATIN SODIUM 40 MG PO TABS
40.0000 mg | ORAL_TABLET | Freq: Every day | ORAL | Status: DC
  Administered 2018-12-03 – 2018-12-06 (×4): 40 mg via ORAL
  Filled 2018-12-03 (×4): qty 1

## 2018-12-03 MED ORDER — SODIUM CHLORIDE 0.9 % IV SOLN
50.0000 mL/h | INTRAVENOUS | Status: DC
  Administered 2018-12-03 – 2018-12-04 (×3): 50 mL/h via INTRAVENOUS

## 2018-12-03 MED ORDER — POLYVINYL ALCOHOL 1.4 % OP SOLN
1.0000 [drp] | OPHTHALMIC | Status: DC
  Administered 2018-12-03 – 2018-12-06 (×14): 1 [drp] via OPHTHALMIC
  Filled 2018-12-03: qty 15

## 2018-12-03 MED ORDER — IOHEXOL 350 MG/ML SOLN
100.0000 mL | Freq: Once | INTRAVENOUS | Status: AC | PRN
  Administered 2018-12-03: 100 mL via INTRAVENOUS

## 2018-12-03 MED ORDER — ORAL CARE MOUTH RINSE
15.0000 mL | Freq: Two times a day (BID) | OROMUCOSAL | Status: DC
  Administered 2018-12-03 – 2018-12-07 (×8): 15 mL via OROMUCOSAL

## 2018-12-03 MED ORDER — PANTOPRAZOLE SODIUM 40 MG IV SOLR
40.0000 mg | Freq: Every day | INTRAVENOUS | Status: DC
  Administered 2018-12-03: 40 mg via INTRAVENOUS
  Filled 2018-12-03: qty 40

## 2018-12-03 MED ORDER — LEVOTHYROXINE SODIUM 25 MCG PO TABS
125.0000 ug | ORAL_TABLET | Freq: Every day | ORAL | Status: DC
  Administered 2018-12-04 – 2018-12-07 (×4): 125 ug via ORAL
  Filled 2018-12-03 (×4): qty 1

## 2018-12-03 MED ORDER — ACETAMINOPHEN 650 MG RE SUPP: 650.0000 mg | RECTAL | Status: DC | PRN

## 2018-12-03 MED ORDER — DONEPEZIL HCL 10 MG PO TABS
10.0000 mg | ORAL_TABLET | Freq: Every day | ORAL | Status: DC
  Administered 2018-12-04 – 2018-12-07 (×4): 10 mg via ORAL
  Filled 2018-12-03 (×4): qty 1

## 2018-12-03 MED ORDER — STROKE: EARLY STAGES OF RECOVERY BOOK
Freq: Once | Status: AC
  Administered 2018-12-03: 1

## 2018-12-03 MED ORDER — SODIUM CHLORIDE 0.9 % IV SOLN
Freq: Once | INTRAVENOUS | Status: AC
  Administered 2018-12-03: 02:00:00 via INTRAVENOUS

## 2018-12-03 MED ORDER — ACETAMINOPHEN 160 MG/5ML PO SOLN: 650.0000 mg | ORAL | Status: DC | PRN

## 2018-12-03 MED ORDER — ACETAMINOPHEN 325 MG PO TABS: 650.0000 mg | ORAL_TABLET | ORAL | Status: DC | PRN

## 2018-12-03 MED ORDER — POTASSIUM CHLORIDE 10 MEQ/100ML IV SOLN
10.0000 meq | INTRAVENOUS | Status: AC
  Administered 2018-12-03 (×3): 10 meq via INTRAVENOUS
  Filled 2018-12-03 (×3): qty 100

## 2018-12-03 MED ORDER — IOPAMIDOL (ISOVUE-370) INJECTION 76%
40.0000 mL | Freq: Once | INTRAVENOUS | Status: AC | PRN
  Administered 2018-12-03: 40 mL via INTRAVENOUS

## 2018-12-03 MED ORDER — SODIUM CHLORIDE 0.9% FLUSH
3.0000 mL | Freq: Two times a day (BID) | INTRAVENOUS | Status: DC
  Administered 2018-12-03 – 2018-12-07 (×8): 3 mL via INTRAVENOUS

## 2018-12-03 MED ORDER — SODIUM CHLORIDE 0.9 % IV SOLN
50.0000 mL | Freq: Once | INTRAVENOUS | Status: AC
  Administered 2018-12-03: 50 mL via INTRAVENOUS

## 2018-12-03 NOTE — Telephone Encounter (Signed)
  Patient is in hospital with stroke that they think is coming from her atrial fib. Dr Lawanna Kobus wants to speak to Dr Lovena Le in regards to notes in her chart that Dr Lovena Le made regarding anticoagulation meds. Dr Lawanna Kobus  phone number is [phone]. Please call to discuss.

## 2018-12-03 NOTE — Evaluation (Signed)
Speech Language Pathology Evaluation Patient Details Name: ZYIONNA PESCE MRN: [id number] DOB: [birth year] Today's Date: 12/03/2018 Time: 1517-6160 SLP Time Calculation (min) (ACUTE ONLY): 40 min  Problem List:  Patient Active Problem List   Diagnosis Date Noted  . Stroke (cerebrum) ([place]) 12/03/2018  . Cough 10/11/2018  . Abnormal breath sounds 10/11/2018  . Closed nondisplaced fracture of proximal phalanx of lesser toe of right foot 06/26/2018  . Right ankle pain 06/21/2018  . Right foot pain 06/21/2018  . Abnormal CXR 04/26/2017  . Lobar pneumonia ([place]) 01/20/2017  . Acute on chronic respiratory failure with hypoxia ([place]) 01/17/2017  . Acute respiratory failure with hypoxia ([place]) 01/17/2017  . DOE (dyspnea on exertion) 10/23/2014  . Hyponatremia 10/23/2014  . RLL pneumonia ([place]) 10/11/2013  . Other specified anemias 10/10/2013  . Acute respiratory failure ([place]) 08/15/2013  . Aspiration pneumonia ([place]) 08/15/2013  . Altered mental status 08/15/2013  . Incarcerated femoral hernia 08/14/2013  . Esophageal reflux 08/05/2013  . Unspecified constipation 07/03/2013  . Closed fracture of left hip ([place]) 07/01/2013  . 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 Atrial fibrillation ([place]) 10/30/2012  . Low back pain 10/26/2012  . Syncope 10/26/2012  . PNA (pneumonia) 09/26/2012  . UTI (lower urinary tract infection) 09/26/2012  . Hypokalemia 09/26/2012  . Leukocytosis 09/26/2012  . Cardiac enzymes elevated 07/25/2011  . Impaired glucose tolerance 06/03/2011  . Preventative health care 06/03/2011  . IRON DEFICIENCY 12/06/2010  . 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 CVA (cerebral vascular accident) ([place]) 01/09/2009  . Aphasia 01/09/2009  . Urinary incontinence 01/09/2009  . PACEMAKER, PERMANENT 01/09/2009  . Dementia ([place]) 05/19/2008  . Hypothyroidism 11/20/2007  . Hyperlipidemia 11/20/2007  . Essential hypertension 11/20/2007  . DIVERTICULOSIS, COLON 11/20/2007   . FATIGUE 11/20/2007  . CEREBROVASCULAR ACCIDENT, HX OF 11/20/2007  . TIA 05/14/2003   Past Medical History:  Past Medical History:  Diagnosis Date  . Aphasia 01/09/2009  . Arthritis    right hand  . 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 CEREBROVASCULAR ACCIDENT, HX OF 11/20/2007  . CHF (congestive heart failure) ([place])   . Coronary artery disease   . CVA 01/09/2009  . DEMENTIA 05/19/2008  . Dementia ([place])   . DIVERTICULOSIS, COLON 11/20/2007  . FATIGUE 11/20/2007  . Femoral fracture ([place]) 07/01/2013  . GLUCOSE INTOLERANCE 12/06/2010  . HYPERLIPIDEMIA 11/20/2007  . HYPERTENSION 11/20/2007   Echo was done 07/07/11: mod LVH, EF 55-60%, grade 1 diast dysfxn, mild MR, mild LAE, mod TR, PASP 39  . HYPOTHYROIDISM 11/20/2007  . Impaired glucose tolerance 06/03/2011  . IRON DEFICIENCY 12/06/2010  . Pacemaker 2002  . PACEMAKER, PERMANENT 01/09/2009  . TIA 05/14/2003   elevated CE's in setting of TIA and falls in 10/12 - echo normal with no further cardiac w/u pursued  . TIA (transient ischemic attack) 10/10/2011  . Urinary incontinence 01/09/2009   Past Surgical History:  Past Surgical History:  Procedure Laterality Date  . ABDOMINAL HYSTERECTOMY    . CHOLECYSTECTOMY    . CYSTOCELE REPAIR N/A 11/26/2012   Procedure: Cystocele Repair with Lefort Colpocleisis.  colpectomy Levatorplasty Perineorraphy;  Surgeon: Lovenia Kim, MD;  Location: [facility];  Service: Gynecology;  Laterality: N/A;  Cystocele Repair with Lefort Colpocleisis.  Marland Kitchen HIP ARTHROPLASTY Left 07/02/2013   Procedure: ARTHROPLASTY BIPOLAR HIP;  Surgeon: Mauri Pole, MD;  Location: [facility];  Service: Orthopedics;  Laterality: Left;  . INGUINAL HERNIA REPAIR Right 08/13/2013   Procedure: HERNIA REPAIR INGUINAL INCARCERATED;  Surgeon:  Ralene Ok, MD;  Location: [facility];  Service: General;  Laterality: Right;  . Medtronic Cynthia dual chamber pacemaker serial number was [id number] H...04/15/2009    . s/p cerebral aneurysm  1991  . s/p pacemaker  DDD    . s/p retinal attachment    . s/p thyroidectomy     HPI:  85 y.o. female with history of a permanent pacemaker for complete AV block, previous strokes, cerebral aneurysm clipping in 1993, hypothyroidism, hypertension, hyperlipidemia, dementia, glucose intolerance, coronary artery disease, and congestive heart failure presented with right-sided weakness and aphasia.  CT head - Acute nonhemorrhagic LEFT frontal/ACA territory infarct; TPA started 12/03/18. PTA, pt resided at [facility] with assistance from personal caregivers. She has baseline mild dementia and mild aphasia, but is normally interactive and conversational.    Assessment / Plan / Recommendation Clinical Impression  Pt presents with a mixed aphasia marked by dysfluent, slow expression of speech with delayed initiation and occasional groping.  Comprehension is marked by decreased ability to follow one-step commands, compounded by perseveratory motor responses and impaired hearing.  Pt was able to answer some basic biographical questions, produce automatic sequences, and sing in conjunction with examiner.  Perseveratory speech notable throughout session.  Pt's aphasia is not at all near baseline, according to her son.  Recommend acute SLP services to address basic communication, initiation.  Pt will need SLP f/u upon D/C. Will await OT/PT evals to discuss recs for disposition.     SLP Assessment  SLP Recommendation/Assessment: Patient needs continued Speech Lanaguage Pathology Services SLP Visit Diagnosis: Aphasia (R47.01)    Follow Up Recommendations  Other (comment)(tba)    Frequency and Duration min 2x/week  2 weeks      SLP Evaluation Cognition  Overall Cognitive Status: Impaired/Different from baseline Arousal/Alertness: Awake/alert Orientation Level: Oriented to person Attention: Focused Focused Attention: Appears intact Awareness: Impaired       Comprehension  Auditory Comprehension Overall  Auditory Comprehension: Impaired Yes/No Questions: Impaired Basic Biographical Questions: 26-50% accurate Commands: Impaired One Step Basic Commands: 25-49% accurate Conversation: Simple Interfering Components: Attention;Hearing EffectiveTechniques: Extra processing time;Increased volume Visual Recognition/Discrimination Discrimination: Not tested Reading Comprehension Reading Status: Not tested    Expression Expression Primary Mode of Expression: Verbal Verbal Expression Overall Verbal Expression: Impaired Initiation: Impaired Automatic Speech: Name;Social Response;Counting(able to recite) Repetition: Impaired Level of Impairment: Sentence level(able to repeat simple sentences) Naming: Impairment Responsive: Not tested Confrontation: Impaired Interfering Components: Attention Written Expression Dominant Hand: Right Written Expression: Not tested   Oral / Motor  Oral Motor/Sensory Function Overall Oral Motor/Sensory Function: Mild impairment Facial Symmetry: Abnormal symmetry right Motor Speech Overall Motor Speech: Impaired Respiration: Within functional limits Phonation: Normal Motor Planning: Impaired Level of Impairment: Phrase Motor Speech Errors: Groping for words   GO                    [name] 12/03/2018, 4:40 PM  Reo Portela L. Tivis Ringer, [place] Office number [phone] Pager [phone]

## 2018-12-03 NOTE — Progress Notes (Signed)
STROKE TEAM PROGRESS NOTE   SUBJECTIVE (INTERVAL HISTORY) Her son is at the bedside.  Pt son stated that pt lives in independent living with assistance from staff. She has baseline dementia but normally interactive and walks. She had acute aphasia and right hemiplegia last night but today her speech seems improvement with words out and able to answer some questions.    OBJECTIVE Vitals:   12/03/18 0729 12/03/18 0800 12/03/18 0900 12/03/18 1000  BP:  140/64 (!) 147/64 (!) 146/61  Pulse:  60 60 60  Resp:  (!) 24 (!) 25 (!) 28  Temp: 97.7 F (36.5 C)     TempSrc: Oral     SpO2:  97% 98% 96%  Weight:      Height:        CBC:  Recent Labs  Lab 12/03/18 0118 12/03/18 0320  WBC 5.8 6.0  NEUTROABS 4.3  --   HGB 12.9 12.4  HCT 39.5 38.0  MCV 96.3 96.0  PLT 224 824    Basic Metabolic Panel:  Recent Labs  Lab 12/03/18 0112 12/03/18 0118  NA  --  136  K  --  3.1*  CL  --  101  CO2  --  23  GLUCOSE  --  151*  BUN  --  35*  CREATININE 1.10* 1.12*  CALCIUM  --  8.3*    Lipid Panel:     Component Value Date/Time   CHOL 101 12/03/2018 0320   TRIG 66 12/03/2018 0320   HDL 40 (L) 12/03/2018 0320   CHOLHDL 2.5 12/03/2018 0320   VLDL 13 12/03/2018 0320   LDLCALC 48 12/03/2018 0320   HgbA1c:  Lab Results  Component Value Date   HGBA1C 5.6 12/03/2018   Urine Drug Screen: No results found for: LABOPIA, COCAINSCRNUR, LABBENZ, AMPHETMU, THCU, LABBARB  Alcohol Level     Component Value Date/Time   ETH <10 12/03/2018 0119    IMAGING  Ct Angio Head W Or Wo Contrast Ct Angio Neck W Or Wo Contrast Ct Cerebral Perfusion W Contrast 12/03/2018 IMPRESSION:   CTA NECK:  1. No hemodynamically significant stenosis ICA's.  2. Patent vertebral arteries.  3. An incidental finding of potential clinical significance has been found. 3.9 cm ascending aorta. Recommend annual imaging followup by CTA or MRA. This recommendation follows 2010 ACCF/AHA/AATS/ACR/ASA/SCA/SCAI/SIR/STS/SVM  Guidelines for the Diagnosis and Management of Patients with Thoracic Aortic Disease. Circulation.2010; 121: M353-I144. Aortic aneurysm NOS (ICD10-I71.9)**   CTA HEAD:  1. Emergent LEFT A2 occlusion, poor collateralization by single-phase CTA.  2. Severe LEFT and moderate RIGHT supraclinoid ICA stenosis.  3. Occluded LEFT MCA at aneurysm clip, intermediate collaterals by single-phase   CTA. CT PERFUSION:  1. Rapid data processing failed.    Ct Head Code Stroke Wo Contrast 12/03/2018  IMPRESSION:  1. Acute nonhemorrhagic LEFT frontal/ACA territory infarct.  2. Old large LEFT MCA territory infarct, status post clipping of LEFT MCA aneurysm.  3. Moderate to severe chronic small vessel ischemic changes. Old small basal ganglia, thalami and cerebellar infarcts.  4. ASPECTS is 10.   Transthoracic Echocard iogram - pending   PHYSICAL EXAM  Temp:  [97.7 F (36.5 C)-97.9 F (36.6 C)] 97.9 F (36.6 C) (03/09 1110) Pulse Rate:  [59-67] 59 (03/09 1400) Resp:  [17-32] 24 (03/09 1400) BP: (123-147)/(50-69) 141/53 (03/09 1400) SpO2:  [82 %-100 %] 96 % (03/09 1400) Weight:  [69.6 kg] 69.6 kg (03/09 0209)  General - Well nourished, well developed, sleepy but easily arousable and maintains  wakefulness  Ophthalmologic - fundi not visualized due to noncooperation.  Cardiovascular - Regular rhythm, bradycardia.  Neuro - sleepy but easily arousable, able to maintain weakfulness. Able to state her name on questioning but moderate dysarthria. Not able to answer other questions. However, able to repeat 3-word sentences and name 2/3. Following other simple commands. PERRL, blinking to visual threat bilaterally. No forced gaze, but seems to have left gaze preference but able to have right gaze. Mild right facial droop. Tongue midline in mouth. LUE at least 3+/5, RUE 0/5 proximal but 3/5 finger grip. LLE 3/5 and RLE mild withdraw on pain stimulation. DTR 1+ and right triple reflex. Sensation, coordination  and gait not tested.    ASSESSMENT/PLAN [name] is a 85 y.o. female with history of a permanent pacemaker for complete AV block, previous strokes, cerebral aneurysm clipping in 1993, hypothyroidism, hypertension, hyperlipidemia, dementia, glucose intolerance, coronary artery disease, and congestive heart failure presenting with right-sided weakness and aphasia.   She received IV TPA on Monday, 12/03/2018 at 0215.  Stroke: LEFT frontal/ACA territory infarct, likely due to Afib not on [facility]  Resultant  Aphasia and right hemiplegia  CT head - Acute nonhemorrhagic LEFT frontal/ACA territory infarct.  Multiple old infarcts.  MRI head - PPM  CTA H&N - Emergent LEFT A2 occlusion. Severe LEFT and moderate RIGHT supraclinoid ICA stenosis. Occluded LEFT MCA at aneurysm clip with intermediate collaterals. Right ICA bulb athero.   CT repeat pending  2D Echo - pending  LDL - 48  HgbA1c - 5.6  VTE prophylaxis - SCDs  Diet - NPO  clopidogrel 75 mg daily prior to admission, now on No antithrombotic within 24hr of tPA  Patient counseled to be compliant with her antithrombotic medications  Ongoing aggressive stroke risk factor management  Therapy recommendations:  pending  Disposition:  Pending  Afib not on [facility]  Pacer has been detecting AF for long time  Deemed not AC candidate by cardiology due to cerebral aneurysm   Since aneurysm has been secured with clipping, pt may be candidate for [facility] given multiple hx of strokes  Will need to discuss with Dr. Lovena Le, her cardiologist   Hx of stroke  Left MCA stroke due to aneurysm clipping in [place] stroke in 2005  06/2011 admitted for left facial and left sided weakness, CT neg, CTA h/n b/l siphon 50-70% stenosis, right MCA distal branch occlusion   Several TIAs over the years  Hypertension  Stable . Permissive hypertension (OK if < 180/105) but gradually normalize in 5-7 days . Long-term BP goal  normotensive  Dementia   Baseline dementia  Some baseline aphasia as per son  Lives in independent living but with assistance from staff  On acricept  Hyperlipidemia  Lipid lowering medication PTA: Pravachol 40 mg daily  LDL 48, goal < 70  Resume Pravachol 40 mg daily once po access  Continue statin at discharge  Dysphagia   Did not pass swallow   NPO now  Speech to follow  Continue IVF  Other Stroke Risk Factors  Advanced age  Coronary artery disease  CHF  Other Active Problems  3.9 cm ascending aorta.  Annual imaging recommended.  Hypokalemia 3.1 - supplement ordered - recheck in a.m.  AVB s/p pacer   Hospital day # 0  This patient is critically ill due to left MCA and ACA infarcts, Afib not on AC, dysphagia and at significant risk of neurological worsening, death form recurrent stroke, afib RVR, heart failure,  aspiration pneumonia. This patient's care requires constant monitoring of vital signs, hemodynamics, respiratory and cardiac monitoring, review of multiple databases, neurological assessment, discussion with family, other specialists and medical decision making of high complexity. I spent 40 minutes of neurocritical care time in the care of this patient. I had long discussion with son at bedside, updated pt current condition, treatment plan and potential prognosis. he expressed understanding and appreciation.   Rosalin Hawking, MD PhD Stroke Neurology 12/03/2018 3:42 PM   To contact Stroke Continuity provider, please refer to [email or web address] After hours, contact General Neurology

## 2018-12-03 NOTE — Progress Notes (Signed)
Rehab Admissions Coordinator Note:  Patient was screened by Cleatrice Burke for appropriateness for an Inpatient Acute Rehab Consult per PT recs.  At this time, we are recommending Inpatient Rehab consult.  Cleatrice Burke RN MSN 12/03/2018, 6:16 PM  I can be reached at [phone].

## 2018-12-03 NOTE — Evaluation (Signed)
Clinical/Bedside Swallow Evaluation Patient Details  Name: [name] MRN: [id number] Date of Birth: 11-Feb-1922  Today's Date: 12/03/2018 Time: SLP Start Time (ACUTE ONLY): 1430 SLP Stop Time (ACUTE ONLY): 1500 SLP Time Calculation (min) (ACUTE ONLY): 30 min  Past Medical History:  Past Medical History:  Diagnosis Date  . Aphasia 01/09/2009  . Arthritis    right hand  . 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 CEREBROVASCULAR ACCIDENT, HX OF 11/20/2007  . CHF (congestive heart failure) ([place])   . Coronary artery disease   . CVA 01/09/2009  . DEMENTIA 05/19/2008  . Dementia ([place])   . DIVERTICULOSIS, COLON 11/20/2007  . FATIGUE 11/20/2007  . Femoral fracture ([place]) 07/01/2013  . GLUCOSE INTOLERANCE 12/06/2010  . HYPERLIPIDEMIA 11/20/2007  . HYPERTENSION 11/20/2007   Echo was done 07/07/11: mod LVH, EF 55-60%, grade 1 diast dysfxn, mild MR, mild LAE, mod TR, PASP 39  . HYPOTHYROIDISM 11/20/2007  . Impaired glucose tolerance 06/03/2011  . IRON DEFICIENCY 12/06/2010  . Pacemaker 2002  . PACEMAKER, PERMANENT 01/09/2009  . TIA 05/14/2003   elevated CE's in setting of TIA and falls in 10/12 - echo normal with no further cardiac w/u pursued  . TIA (transient ischemic attack) 10/10/2011  . Urinary incontinence 01/09/2009   Past Surgical History:  Past Surgical History:  Procedure Laterality Date  . ABDOMINAL HYSTERECTOMY    . CHOLECYSTECTOMY    . CYSTOCELE REPAIR N/A 11/26/2012   Procedure: Cystocele Repair with Lefort Colpocleisis.  colpectomy Levatorplasty Perineorraphy;  Surgeon: Lovenia Kim, MD;  Location: [facility];  Service: Gynecology;  Laterality: N/A;  Cystocele Repair with Lefort Colpocleisis.  Marland Kitchen HIP ARTHROPLASTY Left 07/02/2013   Procedure: ARTHROPLASTY BIPOLAR HIP;  Surgeon: Mauri Pole, MD;  Location: [facility];  Service: Orthopedics;  Laterality: Left;  . INGUINAL HERNIA REPAIR Right 08/13/2013   Procedure: HERNIA REPAIR INGUINAL INCARCERATED;  Surgeon: Ralene Ok, MD;  Location: [facility];  Service: General;  Laterality: Right;  . Medtronic Cynthia dual chamber pacemaker serial number was [id number] H...04/15/2009    . s/p cerebral aneurysm  1991  . s/p pacemaker DDD    . s/p retinal attachment    . s/p thyroidectomy     HPI:  85 y.o. female with history of a permanent pacemaker for complete AV block, previous strokes, cerebral aneurysm clipping in 1993, hypothyroidism, hypertension, hyperlipidemia, dementia, glucose intolerance, coronary artery disease, and congestive heart failure presented with right-sided weakness and aphasia.  CT head - Acute nonhemorrhagic LEFT frontal/ACA territory infarct; TPA started 12/03/18. PTA, pt resided at [facility] with assistance from personal caregivers. She has baseline mild dementia and mild aphasia, but is normally interactive and conversational.    Assessment / Plan / Recommendation Clinical Impression  Pt alert, participatory in clinical swallow assessment.  She demonstrated only mild right CN VII asymmetry on right; otherwise tongue midline; unable to follow additional commands for oral mechanism exam. She demonstrated adequate oral control of purees and thin liquids with no anterior spillage; there was occasional mild throat clearing after consumption of three ounces of water, but no overt coughing.  Pt appears to be protecting airway.  Recommend initiating a full liquid diet given recent GI discomfort per son.  Please give meds whole in puree.  SLP will follow for PO safety and diet advancement as able.  D/W RN, pt's son.  SLP Visit Diagnosis: Dysphagia, unspecified (R13.10)    Aspiration Risk  Mild aspiration risk    Diet Recommendation   full liquids  Medication Administration: Whole meds with puree    Other  Recommendations Oral Care Recommendations: Oral care BID   Follow up Recommendations Other (comment)(tba)      Frequency and Duration min 2x/week  2 weeks       Prognosis Prognosis for Safe Diet  Advancement: Good      Swallow Study   General Date of Onset: 12/03/18 HPI: 85 y.o. female with history of a permanent pacemaker for complete AV block, previous strokes, cerebral aneurysm clipping in 1993, hypothyroidism, hypertension, hyperlipidemia, dementia, glucose intolerance, coronary artery disease, and congestive heart failure presented with right-sided weakness and aphasia.  CT head - Acute nonhemorrhagic LEFT frontal/ACA territory infarct; TPA started 12/03/18. PTA, pt resided at [facility] with assistance from personal caregivers. She has baseline mild dementia and mild aphasia, but is normally interactive and conversational.  Type of Study: Bedside Swallow Evaluation Previous Swallow Assessment: 2018 - functional swallow Diet Prior to this Study: NPO Temperature Spikes Noted: No Respiratory Status: Nasal cannula History of Recent Intubation: No Behavior/Cognition: Alert;Cooperative Oral Cavity Assessment: Within Functional Limits Oral Care Completed by SLP: Recent completion by staff Oral Cavity - Dentition: Adequate natural dentition Vision: Functional for self-feeding Self-Feeding Abilities: Needs assist Patient Positioning: Upright in bed Baseline Vocal Quality: Normal Volitional Cough: Strong Volitional Swallow: Able to elicit    Oral/Motor/Sensory Function Overall Oral Motor/Sensory Function: Mild impairment Facial Symmetry: Abnormal symmetry right   Ice Chips Ice chips: Within functional limits   Thin Liquid Thin Liquid: Within functional limits Presentation: Cup    Nectar Thick Nectar Thick Liquid: Not tested   Honey Thick Honey Thick Liquid: Within functional limits   Puree Puree: Within functional limits   Solid     Solid: Not tested      [name] 12/03/2018,4:23 PM   Estill Bamberg L. Tivis Ringer, [employer] Office number [phone] Pager [phone]

## 2018-12-03 NOTE — Plan of Care (Signed)
Speech passed pt to eat Full liquid diet - fed with supervision

## 2018-12-03 NOTE — Progress Notes (Signed)
  Echocardiogram 2D Echocardiogram has been performed.  [name] 12/03/2018, 2:07 PM

## 2018-12-03 NOTE — Progress Notes (Signed)
PT UNABLE TO HAVE MRI DUE TO ANEURYSM CLIPS FROM 1993 WITH NO DOCUMENTATION.

## 2018-12-03 NOTE — Plan of Care (Signed)
  Problem: Health Behavior/Discharge Planning: Goal: Ability to manage health-related needs will improve Outcome: Progressing   Problem: Self-Care: Goal: Ability to participate in self-care as condition permits will improve Outcome: Progressing Goal: Ability to communicate needs accurately will improve Outcome: Progressing   Problem: Nutrition: Goal: Risk of aspiration will decrease Outcome: Progressing Goal: Dietary intake will improve Outcome: Progressing   Problem: Ischemic Stroke/TIA Tissue Perfusion: Goal: Complications of ischemic stroke/TIA will be minimized Outcome: Progressing

## 2018-12-03 NOTE — Progress Notes (Signed)
Pharmacist Code Stroke Response  Notified to mix tPA at 0212 by Dr. Leonel Ramsay Delivered tPA to RN at [facility].  tPA dose = 6.3mg  bolus over 1 minute followed by 56.3mg  for a total dose of 62.6mg  over 1 hour  Issues/delays encountered (if applicable): Initial LKW time on pt arrival was 3/8 0800; this changed to 3/8 2200 when family arrived.    Wynona Neat, PharmD, BCPS  12/03/18 2:11 AM

## 2018-12-03 NOTE — Progress Notes (Signed)
OT Cancellation Note  Patient Details Name: [name] MRN: [id number] DOB: Jan 09, 1922   Cancelled Treatment:    Reason Eval/Treat Not Completed: Active bedrest order  [place], OT/L   Acute OT Clinical Specialist Acute Rehabilitation Services Pager [phone] Office [phone]  12/03/2018, 8:49 AM

## 2018-12-03 NOTE — ED Notes (Signed)
ED TO INPATIENT HANDOFF REPORT  ED Nurse Name and Phone #:  Gretta Cool [phone]  S Name/Age/Gender [name] 85 y.o. female Room/Bed: TRACC/TRACC  Code Status   Code Status: Full Code  Home/SNF/Other Rehab Patient oriented to: self Is this baseline? No   Triage Complete: Triage complete  Chief Complaint code stroke  Triage Note Patient is from [place].  She was last seen normal at 0800 on 12/02/2018.  Patient is aphasic, with right sided weakness and right sided neglect/deviation to the left.  Patient has had prior TIAs and had a brain aneurysm in 1993.     Allergies Allergies  Allergen Reactions  . Codeine     unknown    Level of Care/Admitting Diagnosis ED Disposition    ED Disposition Condition [facility]: [place] [[id number]]  Level of Care: ICU [6]  Diagnosis: Stroke (cerebrum) [facility]) [[id number]]  Admitting Physician: Leonel Ramsay, MCNEILL P [place]  Attending Physician: Leonel Ramsay, MCNEILL P [[id number]]  Estimated length of stay: past midnight tomorrow  Certification:: I certify this patient will need inpatient services for at least 2 midnights  PT Class (Do Not Modify): Inpatient [101]  PT Acc Code (Do Not Modify): Private [1]       B Medical/Surgery History Past Medical History:  Diagnosis Date  . Aphasia 01/09/2009  . Arthritis    right hand  . 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 CEREBROVASCULAR ACCIDENT, HX OF 11/20/2007  . CHF (congestive heart failure) ([place])   . Coronary artery disease   . CVA 01/09/2009  . DEMENTIA 05/19/2008  . Dementia ([place])   . DIVERTICULOSIS, COLON 11/20/2007  . FATIGUE 11/20/2007  . Femoral fracture ([place]) 07/01/2013  . GLUCOSE INTOLERANCE 12/06/2010  . HYPERLIPIDEMIA 11/20/2007  . HYPERTENSION 11/20/2007   Echo was done 07/07/11: mod LVH, EF 55-60%, grade 1 diast dysfxn, mild MR, mild LAE, mod TR, PASP 39  . HYPOTHYROIDISM 11/20/2007  . Impaired glucose tolerance  06/03/2011  . IRON DEFICIENCY 12/06/2010  . Pacemaker 2002  . PACEMAKER, PERMANENT 01/09/2009  . TIA 05/14/2003   elevated CE's in setting of TIA and falls in 10/12 - echo normal with no further cardiac w/u pursued  . TIA (transient ischemic attack) 10/10/2011  . Urinary incontinence 01/09/2009   Past Surgical History:  Procedure Laterality Date  . ABDOMINAL HYSTERECTOMY    . CHOLECYSTECTOMY    . CYSTOCELE REPAIR N/A 11/26/2012   Procedure: Cystocele Repair with Lefort Colpocleisis.  colpectomy Levatorplasty Perineorraphy;  Surgeon: Lovenia Kim, MD;  Location: [facility];  Service: Gynecology;  Laterality: N/A;  Cystocele Repair with Lefort Colpocleisis.  Marland Kitchen HIP ARTHROPLASTY Left 07/02/2013   Procedure: ARTHROPLASTY BIPOLAR HIP;  Surgeon: Mauri Pole, MD;  Location: [facility];  Service: Orthopedics;  Laterality: Left;  . INGUINAL HERNIA REPAIR Right 08/13/2013   Procedure: HERNIA REPAIR INGUINAL INCARCERATED;  Surgeon: Ralene Ok, MD;  Location: [facility];  Service: General;  Laterality: Right;  . Medtronic Cynthia dual chamber pacemaker serial number was [id number]...04/15/2009    . s/p cerebral aneurysm  1991  . s/p pacemaker DDD    . s/p retinal attachment    . s/p thyroidectomy       A IV Location/Drains/Wounds Patient Lines/Drains/Airways Status   Active Line/Drains/Airways    Name:   Placement date:   Placement time:   Site:   Days:   Peripheral IV 12/03/18 Left Antecubital   12/03/18    -  Antecubital   less than 1   Peripheral IV 12/03/18 Left Forearm   12/03/18    -    Forearm   less than 1          Intake/Output Last 24 hours No intake or output data in the 24 hours ending 12/03/18 6256  Labs/Imaging Results for orders placed or performed during the hospital encounter of 12/03/18 (from the past 48 hour(s))  CBG monitoring, ED     Status: Abnormal   Collection Time: 12/03/18  1:06 AM  Result Value Ref Range   Glucose-Capillary 134 (H) 70 - 99 mg/dL  I-stat Creatinine,  ED     Status: Abnormal   Collection Time: 12/03/18  1:12 AM  Result Value Ref Range   Creatinine, Ser 1.10 (H) 0.44 - 1.00 mg/dL  Protime-INR     Status: None   Collection Time: 12/03/18  1:18 AM  Result Value Ref Range   Prothrombin Time 13.7 11.4 - 15.2 seconds   INR 1.1 0.8 - 1.2    Comment: (NOTE) INR goal varies based on device and disease states. Performed at [facility], [place] [address], [place], [place] [address]   APTT     Status: None   Collection Time: 12/03/18  1:18 AM  Result Value Ref Range   aPTT 35 24 - 36 seconds    Comment: Performed at [facility] [address], [place], [place] [address]  CBC     Status: None   Collection Time: 12/03/18  1:18 AM  Result Value Ref Range   WBC 5.8 4.0 - 10.5 K/uL   RBC 4.10 3.87 - 5.11 MIL/uL   Hemoglobin 12.9 12.0 - 15.0 g/dL   HCT 39.5 36.0 - 46.0 %   MCV 96.3 80.0 - 100.0 fL   MCH 31.5 26.0 - 34.0 pg   MCHC 32.7 30.0 - 36.0 g/dL   RDW 12.8 11.5 - 15.5 %   Platelets 224 150 - 400 K/uL   nRBC 0.0 0.0 - 0.2 %    Comment: Performed at [facility], [place] [address], [place], [place] [address]  Differential     Status: None   Collection Time: 12/03/18  1:18 AM  Result Value Ref Range   Neutrophils Relative % 72 %   Neutro Abs 4.3 1.7 - 7.7 K/uL   Lymphocytes Relative 13 %   Lymphs Abs 0.8 0.7 - 4.0 K/uL   Monocytes Relative 13 %   Monocytes Absolute 0.7 0.1 - 1.0 K/uL   Eosinophils Relative 1 %   Eosinophils Absolute 0.0 0.0 - 0.5 K/uL   Basophils Relative 0 %   Basophils Absolute 0.0 0.0 - 0.1 K/uL   Immature Granulocytes 1 %   Abs Immature Granulocytes 0.03 0.00 - 0.07 K/uL    Comment: Performed at [facility] [address], [place], [place] [address]  Comprehensive metabolic panel     Status: Abnormal   Collection Time: 12/03/18  1:18 AM  Result Value Ref Range   Sodium 136 135 - 145 mmol/L   Potassium 3.1 (L) 3.5 - 5.1 mmol/L   Chloride 101 98 - 111 mmol/L   CO2 23 22 - 32 mmol/L    Glucose, Bld 151 (H) 70 - 99 mg/dL   BUN 35 (H) 8 - 23 mg/dL   Creatinine, Ser 1.12 (H) 0.44 - 1.00 mg/dL   Calcium 8.3 (L) 8.9 - 10.3 mg/dL   Total Protein 6.4 (L) 6.5 -  8.1 g/dL   Albumin 3.5 3.5 - 5.0 g/dL   AST 24 15 - 41 U/L   ALT 21 0 - 44 U/L   Alkaline Phosphatase 58 38 - 126 U/L   Total Bilirubin 0.7 0.3 - 1.2 mg/dL   GFR calc non Af Amer 42 (L) >60 mL/min   GFR calc Af Amer 49 (L) >60 mL/min   Anion gap 12 5 - 15    Comment: Performed at [facility] [address], [place], [place] [address]  Ethanol     Status: None   Collection Time: 12/03/18  1:19 AM  Result Value Ref Range   Alcohol, Ethyl (B) <10 <10 mg/dL    Comment: (NOTE) Lowest detectable limit for serum alcohol is 10 mg/dL. For medical purposes only. Performed at [facility], [place] [address], [place], [place] [address]    Ct Angio Head W Or Wo Contrast  Result Date: 12/03/2018 CLINICAL DATA:  Facial droop.  Nonverbal. EXAM: CT ANGIOGRAPHY HEAD AND NECK CT PERFUSION BRAIN TECHNIQUE: Multidetector CT imaging of the head and neck was performed using the standard protocol during bolus administration of intravenous contrast. Multiplanar CT image reconstructions and MIPs were obtained to evaluate the vascular anatomy. Carotid stenosis measurements (when applicable) are obtained utilizing NASCET criteria, using the distal internal carotid diameter as the denominator. Multiphase CT imaging of the brain was performed following IV bolus contrast injection. Subsequent parametric perfusion maps were calculated using RAPID software. CONTRAST:  146mL OMNIPAQUE IOHEXOL 350 MG/ML SOLN, 41mL ISOVUE-370 IOPAMIDOL (ISOVUE-370) INJECTION 76%; 117mL OMNIPAQUE IOHEXOL 350 MG/ML SOLN COMPARISON:  CT HEAD December 03, 2018 FINDINGS: CTA NECK FINDINGS: AORTIC ARCH: 3.9 cm ascending aorta. Moderate calcific atherosclerosis. The origins of the innominate, left Common carotid artery and subclavian artery are patent. RIGHT CAROTID  SYSTEM: Common carotid artery is patent, trace calcific atherosclerosis. Moderate calcific atherosclerosis of the carotid bifurcation without hemodynamically significant stenosis by NASCET criteria. Normal appearance of the internal carotid artery. LEFT CAROTID SYSTEM: Common carotid artery is patent. Mild calcific atherosclerosis of the carotid bifurcation without hemodynamically significant stenosis by NASCET criteria. Normal appearance of the internal carotid artery. VERTEBRAL ARTERIES:Left vertebral artery is dominant. Normal appearance of the vertebral arteries, widely patent. SKELETON: No acute osseous process though bone windows have not been submitted. OTHER NECK: Soft tissues of the neck are nonacute though, not tailored for evaluation. UPPER CHEST: Mosaic lung attenuation seen with small airway disease and/or pulmonary edema. Narrowed tracheobronchial tree, possible malacia. Streak artifact from LEFT cardiac pacemaker battery pack. CTA HEAD FINDINGS: ANTERIOR CIRCULATION: Patent cervical internal carotid arteries, petrous, cavernous and supra clinoid internal carotid arteries. Severe LEFT and moderate RIGHT stenosis supraclinoid ICA's. Patent anterior communicating artery. Gradual loss of LEFT A2 contrast opacification, poor collateralization. Patent RIGHT ACA. Irregular LEFT MCA, occluded at aneurysm clip with intermediate collaterals. Patent RIGHT middle cerebral artery, severe stenosis RIGHT M2 segments. No contrast extravasation or aneurysm. POSTERIOR CIRCULATION: Patent vertebral arteries, vertebrobasilar junction and basilar artery, as well as main branch vessels. Patent posterior cerebral arteries, moderate luminal irregularity compatible with atherosclerosis. Small LEFT posterior communicating artery. No large vessel occlusion, flow-limiting stenosis, contrast extravasation or aneurysm. VENOUS SINUSES: Major dural venous sinuses are patent though not tailored for evaluation on this angiographic  examination. ANATOMIC VARIANTS: None. DELAYED PHASE: Not performed. MIP images reviewed. CT Brain Perfusion Findings: Rapid data processing failed. IMPRESSION: CTA NECK: 1. No hemodynamically significant stenosis ICA's. 2. Patent vertebral arteries. 3. **An incidental finding of potential  clinical significance has been found. 3.9 cm ascending aorta. Recommend annual imaging followup by CTA or MRA. This recommendation follows 2010 ACCF/AHA/AATS/ACR/ASA/SCA/SCAI/SIR/STS/SVM Guidelines for the Diagnosis and Management of Patients with Thoracic Aortic Disease. Circulation.2010; 121: U023-X435. Aortic aneurysm NOS (ICD10-I71.9)** CTA HEAD: 1. Emergent LEFT A2 occlusion, poor collateralization by single-phase CTA. 2. Severe LEFT and moderate RIGHT supraclinoid ICA stenosis. 3. Occluded LEFT MCA at aneurysm clip, intermediate collaterals by single-phase CTA. CT PERFUSION: 1. Rapid data processing failed. Critical Value/emergent results text paged to Dr.MCNEILL [facility] via AMION secure system on 12/03/2018 at 1:35 am, including interpreting physician's phone number. Perfusion failure discussed with and reconfirmed by Dr.MCNEILL [facility] on 12/03/2018 at 2:20 am. Electronically Signed   By: Elon Alas M.D.   On: 12/03/2018 02:24   Ct Angio Neck W Or Wo Contrast  Result Date: 12/03/2018 CLINICAL DATA:  Facial droop.  Nonverbal. EXAM: CT ANGIOGRAPHY HEAD AND NECK CT PERFUSION BRAIN TECHNIQUE: Multidetector CT imaging of the head and neck was performed using the standard protocol during bolus administration of intravenous contrast. Multiplanar CT image reconstructions and MIPs were obtained to evaluate the vascular anatomy. Carotid stenosis measurements (when applicable) are obtained utilizing NASCET criteria, using the distal internal carotid diameter as the denominator. Multiphase CT imaging of the brain was performed following IV bolus contrast injection. Subsequent parametric perfusion maps were calculated  using RAPID software. CONTRAST:  135mL OMNIPAQUE IOHEXOL 350 MG/ML SOLN, 60mL ISOVUE-370 IOPAMIDOL (ISOVUE-370) INJECTION 76%; 177mL OMNIPAQUE IOHEXOL 350 MG/ML SOLN COMPARISON:  CT HEAD December 03, 2018 FINDINGS: CTA NECK FINDINGS: AORTIC ARCH: 3.9 cm ascending aorta. Moderate calcific atherosclerosis. The origins of the innominate, left Common carotid artery and subclavian artery are patent. RIGHT CAROTID SYSTEM: Common carotid artery is patent, trace calcific atherosclerosis. Moderate calcific atherosclerosis of the carotid bifurcation without hemodynamically significant stenosis by NASCET criteria. Normal appearance of the internal carotid artery. LEFT CAROTID SYSTEM: Common carotid artery is patent. Mild calcific atherosclerosis of the carotid bifurcation without hemodynamically significant stenosis by NASCET criteria. Normal appearance of the internal carotid artery. VERTEBRAL ARTERIES:Left vertebral artery is dominant. Normal appearance of the vertebral arteries, widely patent. SKELETON: No acute osseous process though bone windows have not been submitted. OTHER NECK: Soft tissues of the neck are nonacute though, not tailored for evaluation. UPPER CHEST: Mosaic lung attenuation seen with small airway disease and/or pulmonary edema. Narrowed tracheobronchial tree, possible malacia. Streak artifact from LEFT cardiac pacemaker battery pack. CTA HEAD FINDINGS: ANTERIOR CIRCULATION: Patent cervical internal carotid arteries, petrous, cavernous and supra clinoid internal carotid arteries. Severe LEFT and moderate RIGHT stenosis supraclinoid ICA's. Patent anterior communicating artery. Gradual loss of LEFT A2 contrast opacification, poor collateralization. Patent RIGHT ACA. Irregular LEFT MCA, occluded at aneurysm clip with intermediate collaterals. Patent RIGHT middle cerebral artery, severe stenosis RIGHT M2 segments. No contrast extravasation or aneurysm. POSTERIOR CIRCULATION: Patent vertebral arteries,  vertebrobasilar junction and basilar artery, as well as main branch vessels. Patent posterior cerebral arteries, moderate luminal irregularity compatible with atherosclerosis. Small LEFT posterior communicating artery. No large vessel occlusion, flow-limiting stenosis, contrast extravasation or aneurysm. VENOUS SINUSES: Major dural venous sinuses are patent though not tailored for evaluation on this angiographic examination. ANATOMIC VARIANTS: None. DELAYED PHASE: Not performed. MIP images reviewed. CT Brain Perfusion Findings: Rapid data processing failed. IMPRESSION: CTA NECK: 1. No hemodynamically significant stenosis ICA's. 2. Patent vertebral arteries. 3. **An incidental finding of potential clinical significance has been found. 3.9 cm ascending aorta. Recommend annual imaging followup by CTA or MRA. This recommendation follows  2010 ACCF/AHA/AATS/ACR/ASA/SCA/SCAI/SIR/STS/SVM Guidelines for the Diagnosis and Management of Patients with Thoracic Aortic Disease. Circulation.2010; 121: K539-J673. Aortic aneurysm NOS (ICD10-I71.9)** CTA HEAD: 1. Emergent LEFT A2 occlusion, poor collateralization by single-phase CTA. 2. Severe LEFT and moderate RIGHT supraclinoid ICA stenosis. 3. Occluded LEFT MCA at aneurysm clip, intermediate collaterals by single-phase CTA. CT PERFUSION: 1. Rapid data processing failed. Critical Value/emergent results text paged to Dr.MCNEILL [facility] via AMION secure system on 12/03/2018 at 1:35 am, including interpreting physician's phone number. Perfusion failure discussed with and reconfirmed by Dr.MCNEILL [facility] on 12/03/2018 at 2:20 am. Electronically Signed   By: Elon Alas M.D.   On: 12/03/2018 02:24   Ct Cerebral Perfusion W Contrast  Result Date: 12/03/2018 CLINICAL DATA:  Facial droop.  Nonverbal. EXAM: CT ANGIOGRAPHY HEAD AND NECK CT PERFUSION BRAIN TECHNIQUE: Multidetector CT imaging of the head and neck was performed using the standard protocol during bolus  administration of intravenous contrast. Multiplanar CT image reconstructions and MIPs were obtained to evaluate the vascular anatomy. Carotid stenosis measurements (when applicable) are obtained utilizing NASCET criteria, using the distal internal carotid diameter as the denominator. Multiphase CT imaging of the brain was performed following IV bolus contrast injection. Subsequent parametric perfusion maps were calculated using RAPID software. CONTRAST:  172mL OMNIPAQUE IOHEXOL 350 MG/ML SOLN, 63mL ISOVUE-370 IOPAMIDOL (ISOVUE-370) INJECTION 76%; 173mL OMNIPAQUE IOHEXOL 350 MG/ML SOLN COMPARISON:  CT HEAD December 03, 2018 FINDINGS: CTA NECK FINDINGS: AORTIC ARCH: 3.9 cm ascending aorta. Moderate calcific atherosclerosis. The origins of the innominate, left Common carotid artery and subclavian artery are patent. RIGHT CAROTID SYSTEM: Common carotid artery is patent, trace calcific atherosclerosis. Moderate calcific atherosclerosis of the carotid bifurcation without hemodynamically significant stenosis by NASCET criteria. Normal appearance of the internal carotid artery. LEFT CAROTID SYSTEM: Common carotid artery is patent. Mild calcific atherosclerosis of the carotid bifurcation without hemodynamically significant stenosis by NASCET criteria. Normal appearance of the internal carotid artery. VERTEBRAL ARTERIES:Left vertebral artery is dominant. Normal appearance of the vertebral arteries, widely patent. SKELETON: No acute osseous process though bone windows have not been submitted. OTHER NECK: Soft tissues of the neck are nonacute though, not tailored for evaluation. UPPER CHEST: Mosaic lung attenuation seen with small airway disease and/or pulmonary edema. Narrowed tracheobronchial tree, possible malacia. Streak artifact from LEFT cardiac pacemaker battery pack. CTA HEAD FINDINGS: ANTERIOR CIRCULATION: Patent cervical internal carotid arteries, petrous, cavernous and supra clinoid internal carotid arteries. Severe LEFT  and moderate RIGHT stenosis supraclinoid ICA's. Patent anterior communicating artery. Gradual loss of LEFT A2 contrast opacification, poor collateralization. Patent RIGHT ACA. Irregular LEFT MCA, occluded at aneurysm clip with intermediate collaterals. Patent RIGHT middle cerebral artery, severe stenosis RIGHT M2 segments. No contrast extravasation or aneurysm. POSTERIOR CIRCULATION: Patent vertebral arteries, vertebrobasilar junction and basilar artery, as well as main branch vessels. Patent posterior cerebral arteries, moderate luminal irregularity compatible with atherosclerosis. Small LEFT posterior communicating artery. No large vessel occlusion, flow-limiting stenosis, contrast extravasation or aneurysm. VENOUS SINUSES: Major dural venous sinuses are patent though not tailored for evaluation on this angiographic examination. ANATOMIC VARIANTS: None. DELAYED PHASE: Not performed. MIP images reviewed. CT Brain Perfusion Findings: Rapid data processing failed. IMPRESSION: CTA NECK: 1. No hemodynamically significant stenosis ICA's. 2. Patent vertebral arteries. 3. **An incidental finding of potential clinical significance has been found. 3.9 cm ascending aorta. Recommend annual imaging followup by CTA or MRA. This recommendation follows 2010 ACCF/AHA/AATS/ACR/ASA/SCA/SCAI/SIR/STS/SVM Guidelines for the Diagnosis and Management of Patients with Thoracic Aortic Disease. Circulation.2010; 121: A193-X902. Aortic aneurysm NOS (ICD10-I71.9)** CTA  HEAD: 1. Emergent LEFT A2 occlusion, poor collateralization by single-phase CTA. 2. Severe LEFT and moderate RIGHT supraclinoid ICA stenosis. 3. Occluded LEFT MCA at aneurysm clip, intermediate collaterals by single-phase CTA. CT PERFUSION: 1. Rapid data processing failed. Critical Value/emergent results text paged to Dr.MCNEILL [facility] via AMION secure system on 12/03/2018 at 1:35 am, including interpreting physician's phone number. Perfusion failure discussed with and  reconfirmed by Dr.MCNEILL [facility] on 12/03/2018 at 2:20 am. Electronically Signed   By: Elon Alas M.D.   On: 12/03/2018 02:24   Ct Head Code Stroke Wo Contrast  Result Date: 12/03/2018 CLINICAL DATA:  Code stroke.  Facial droop.  Nonverbal. EXAM: CT HEAD WITHOUT CONTRAST TECHNIQUE: Contiguous axial images were obtained from the base of the skull through the vertex without intravenous contrast. COMPARISON:  CT HEAD January 11, 2015 FINDINGS: BRAIN: Mesial LEFT frontal blurring of the gray-white matter junction. No intraparenchymal hemorrhage, mass effect nor midline shift. LEFT frontotemporoparietal cystic encephalomalacia. Old basal ganglia and bilateral thalami small infarcts. Old LEFT > RIGHT cerebellar infarcts. Confluent supratentorial white matter hypodensities. Ex vacuo dilatation LEFT lateral ventricle. No hydrocephalus. Scattered punctate parenchymal versus extra-axial calcifications. No abnormal extra-axial fluid collections. Basal cisterns are patent. VASCULAR: Moderate to severe calcific atherosclerosis of the carotid siphons. Status post clipping of LEFT MCA bifurcation aneurysm. SKULL: No skull fracture. Severe bilateral temporomandibular osteoarthrosis. Old LEFT frontal craniotomy. No significant scalp soft tissue swelling. SINUSES/ORBITS: Trace paranasal sinus mucosal thickening. Mastoid air cells are well aerated.The included ocular globes and orbital contents are non-suspicious. Status post bilateral ocular lens implants. OTHER: None. ASPECTS [facility] Stroke Program Early CT Score) - Ganglionic level infarction (caudate, lentiform nuclei, internal capsule, insula, M1-M3 cortex): 7 - Supraganglionic infarction (M4-M6 cortex): 3 Total score (0-10 with 10 being normal): 10 IMPRESSION: 1. Acute nonhemorrhagic LEFT frontal/ACA territory infarct. 2. Old large LEFT MCA territory infarct, status post clipping of LEFT MCA aneurysm. 3. Moderate to severe chronic small vessel ischemic changes. Old  small basal ganglia, thalami and cerebellar infarcts. 4. ASPECTS is 10. 5. Critical Value/emergent results were called by telephone at the time of interpretation on 12/03/2018 at 1:22 am to Dr. Leonel Ramsay, Neurology, who verbally acknowledged these results. Electronically Signed   By: Elon Alas M.D.   On: 12/03/2018 01:23    Pending Labs Unresulted Labs (From admission, onward)    Start     Ordered   12/03/18 0500  CBC  Tomorrow morning,   R     12/03/18 0234   12/03/18 0500  Hemoglobin A1c  Tomorrow morning,   R     12/03/18 0234   12/03/18 0500  Lipid panel  Tomorrow morning,   R    Comments:  Fasting    12/03/18 0234   12/03/18 0105  Urine rapid drug screen (hosp performed)  ONCE - STAT,   STAT     12/03/18 0105   12/03/18 0105  Urinalysis, Routine w reflex microscopic  ONCE - STAT,   STAT     12/03/18 0105          Vitals/Pain Today's Vitals   12/03/18 0215 12/03/18 0230 12/03/18 0245 12/03/18 0300  BP: 130/60 (!) 124/54 (!) 124/50 (!) 130/53  Pulse: 60 61 60 61  Resp: (!) 28 (!) 22 (!) 25 (!) 29  Temp:      TempSrc:      SpO2: 92% 90% (!) 89% 93%  Weight:      Height:        Isolation Precautions  No active isolations  Medications Medications  alteplase (ACTIVASE) 1 mg/mL infusion 62.6 mg (62.6 mg Intravenous New Bag/Given 12/03/18 0217)    Followed by  0.9 %  sodium chloride infusion (has no administration in time range)   stroke: mapping our early stages of recovery book (has no administration in time range)  0.9 %  sodium chloride infusion (has no administration in time range)  acetaminophen (TYLENOL) tablet 650 mg (has no administration in time range)    Or  acetaminophen (TYLENOL) solution 650 mg (has no administration in time range)    Or  acetaminophen (TYLENOL) suppository 650 mg (has no administration in time range)  labetalol (NORMODYNE,TRANDATE) injection 10 mg (has no administration in time range)    And  nicardipine (CARDENE) 20mg  in 0.86%  saline 258ml IV infusion (0.1 mg/ml) (has no administration in time range)  pantoprazole (PROTONIX) injection 40 mg (has no administration in time range)  potassium chloride 10 mEq in 100 mL IVPB (has no administration in time range)  iohexol (OMNIPAQUE) 350 MG/ML injection 100 mL (100 mLs Intravenous Contrast Given 12/03/18 0130)  iopamidol (ISOVUE-370) 76 % injection 40 mL (40 mLs Intravenous Contrast Given 12/03/18 0205)  0.9 %  sodium chloride infusion ( Intravenous New Bag/Given 12/03/18 0215)    Mobility non-ambulatory High fall risk   Focused Assessments Neuro Assessment Handoff:  Swallow screen pass? No    NIH Stroke Scale ( + Modified Stroke Scale Criteria)  Interval: Other (Comment) Level of Consciousness (1a.)   : Alert, keenly responsive LOC Questions (1b. )   +: Answers neither question correctly LOC Commands (1c. )   + : Performs neither task correctly Best Gaze (2. )  +: Normal Visual (3. )  +: Complete hemianopia Facial Palsy (4. )    : Minor paralysis Motor Arm, Left (5a. )   +: No drift Motor Arm, Right (5b. )   +: Some effort against gravity Motor Leg, Left (6a. )   +: No drift Motor Leg, Right (6b. )   +: No movement Limb Ataxia (7. ): Absent Sensory (8. )   +: Mild-to-moderate sensory loss, patient feels pinprick is less sharp or is dull on the affected side, or there is a loss of superficial pain with pinprick, but patient is aware of being touched Best Language (9. )   +: Severe aphasia Dysarthria (10. ): Normal Extinction/Inattention (11.)   +: No Abnormality Modified SS Total  +: 15 Complete NIHSS TOTAL: 16 Last date known well: 12/02/18 Last time known well: 2200 Neuro Assessment: Exceptions to WDL Neuro Checks:   Initial (12/03/18 0148)  Last Documented NIHSS Modified Score: 15 (12/03/18 0302) Has TPA been given? Yes Temp: 97.8 F (36.6 C) (03/09 0139) Temp Source: Temporal (03/09 0139) BP: 130/53 (03/09 0300) Pulse Rate: 61 (03/09 0300) If patient  is a Neuro Trauma and patient is going to OR before floor call report to [place] nurse: [phone] or [phone]     R Recommendations: See Admitting Provider Note  Report given to:  Aldona Bar, RN  Additional Notes:  Family wanting therapy due to LSN at 2200.

## 2018-12-03 NOTE — Plan of Care (Signed)
  Problem: Education: Goal: Knowledge of disease or condition will improve Outcome: Not Progressing Goal: Knowledge of secondary prevention will improve Outcome: Not Progressing   

## 2018-12-03 NOTE — Progress Notes (Signed)
Text page to Dr. Lorraine Lax to notify of change in patients NIH assessment - pt was able to hold Lt leg up off of bed all morning for 7 seconds or more - at 1305 pt unable to lift leg off of bed at all. She still has some tone in that leg and  can move ankle and foot/toes

## 2018-12-03 NOTE — ED Notes (Signed)
Patient placed on 5L via [place] for low oxygen sats after CT scan.

## 2018-12-03 NOTE — ED Notes (Addendum)
Delay of therapy was due to reported LSN of 0800 on 3/8/20020 by EMS.  Dr Leonel Ramsay spoke to CNA that dressed patient for bed that night and CNA stated she left the room at 2200 on 12/02/2018.  Called pharmacy for possible tPA therapy.

## 2018-12-03 NOTE — ED Provider Notes (Signed)
[facility] Provider Note  CSN: [id number] Arrival date & time: 12/03/18 0104  Chief Complaint(s) Code Stroke  HPI [name] is a 85 y.o. female with a history of cerebral aneurysm resulting in left MCA stroke with residual aphasia who presents to the emergency department as a code stroke for right-sided deficits.  Last seen normal was approximately 2200.  Patient lives in independent living.  Patient was found around midnight after a well check.  She was found sitting in her chair slumped over with right-sided neglect.  Remainder of history, ROS, and physical exam limited due to patient's condition (dementia, aphasia). Additional information was obtained from EMS and Son.   Level V Caveat.    HPI  Past Medical History Past Medical History:  Diagnosis Date  . Aphasia 01/09/2009  . Arthritis    right hand  . 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 CEREBROVASCULAR ACCIDENT, HX OF 11/20/2007  . CHF (congestive heart failure) ([place])   . Coronary artery disease   . CVA 01/09/2009  . DEMENTIA 05/19/2008  . Dementia ([place])   . DIVERTICULOSIS, COLON 11/20/2007  . FATIGUE 11/20/2007  . Femoral fracture ([place]) 07/01/2013  . GLUCOSE INTOLERANCE 12/06/2010  . HYPERLIPIDEMIA 11/20/2007  . HYPERTENSION 11/20/2007   Echo was done 07/07/11: mod LVH, EF 55-60%, grade 1 diast dysfxn, mild MR, mild LAE, mod TR, PASP 39  . HYPOTHYROIDISM 11/20/2007  . Impaired glucose tolerance 06/03/2011  . IRON DEFICIENCY 12/06/2010  . Pacemaker 2002  . PACEMAKER, PERMANENT 01/09/2009  . TIA 05/14/2003   elevated CE's in setting of TIA and falls in 10/12 - echo normal with no further cardiac w/u pursued  . TIA (transient ischemic attack) 10/10/2011  . Urinary incontinence 01/09/2009   Patient Active Problem List   Diagnosis Date Noted  . Stroke (cerebrum) ([place]) 12/03/2018  . Cough 10/11/2018  . Abnormal breath sounds 10/11/2018  . Closed nondisplaced fracture of proximal phalanx  of lesser toe of right foot 06/26/2018  . Right ankle pain 06/21/2018  . Right foot pain 06/21/2018  . Abnormal CXR 04/26/2017  . Lobar pneumonia ([place]) 01/20/2017  . Acute on chronic respiratory failure with hypoxia ([place]) 01/17/2017  . Acute respiratory failure with hypoxia ([place]) 01/17/2017  . DOE (dyspnea on exertion) 10/23/2014  . Hyponatremia 10/23/2014  . RLL pneumonia ([place]) 10/11/2013  . Other specified anemias 10/10/2013  . Acute respiratory failure ([place]) 08/15/2013  . Aspiration pneumonia ([place]) 08/15/2013  . Altered mental status 08/15/2013  . Incarcerated femoral hernia 08/14/2013  . Esophageal reflux 08/05/2013  . Unspecified constipation 07/03/2013  . Closed fracture of left hip ([place]) 07/01/2013  . 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 Atrial fibrillation ([place]) 10/30/2012  . Low back pain 10/26/2012  . Syncope 10/26/2012  . PNA (pneumonia) 09/26/2012  . UTI (lower urinary tract infection) 09/26/2012  . Hypokalemia 09/26/2012  . Leukocytosis 09/26/2012  . Cardiac enzymes elevated 07/25/2011  . Impaired glucose tolerance 06/03/2011  . Preventative health care 06/03/2011  . IRON DEFICIENCY 12/06/2010  . 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 CVA (cerebral vascular accident) ([place]) 01/09/2009  . Aphasia 01/09/2009  . Urinary incontinence 01/09/2009  . PACEMAKER, PERMANENT 01/09/2009  . Dementia ([place]) 05/19/2008  . Hypothyroidism 11/20/2007  . Hyperlipidemia 11/20/2007  . Essential hypertension 11/20/2007  . DIVERTICULOSIS, COLON 11/20/2007  . FATIGUE 11/20/2007  . CEREBROVASCULAR ACCIDENT, HX OF 11/20/2007  . TIA 05/14/2003   Home Medication(s) Prior to Admission medications   Medication Sig  Start Date End Date Taking? Authorizing Provider  amLODipine (NORVASC) 10 MG tablet TAKE 1 TABLET BY MOUTH DAILY. RESUME IN 3 DAYS IS SYSTOLIC BLOOD PRESSURE IS GREATER THAN 150. Patient taking differently: Take 10 mg by mouth daily.  10/01/18  Yes Biagio Borg,  MD  Calcium Carbonate-Vitamin D (CALTRATE 600+D) 600-400 MG-UNIT per tablet Take 1 tablet by mouth at bedtime.    Yes [provider]  clopidogrel (PLAVIX) 75 MG tablet TAKE 1 TABLET(75 MG) BY MOUTH DAILY 10/01/18  Yes Biagio Borg, MD  Docusate Sodium (DSS) 100 MG CAPS Take 100 mg by mouth every morning.  07/03/13  Yes Danae Orleans, PA-C  donepezil (ARICEPT) 10 MG tablet TAKE 1 TABLET(10 MG) BY MOUTH DAILY 10/01/18  Yes Biagio Borg, MD  levothyroxine (SYNTHROID, LEVOTHROID) 125 MCG tablet TAKE 1 TABLET(125 MCG) BY MOUTH DAILY 10/01/18  Yes Biagio Borg, MD  losartan (COZAAR) 100 MG tablet TAKE 1 TABLET(100 MG) BY MOUTH DAILY 10/01/18  Yes Biagio Borg, MD  Multiple Vitamin (MULTIVITAMIN WITH MINERALS) TABS Take 1 tablet by mouth every morning. Centrum silver   Yes [provider]  polyethylene glycol (MIRALAX / GLYCOLAX) packet Take 17 g by mouth daily as needed for mild constipation.   Yes [provider]  polyvinyl alcohol (ARTIFICIAL TEARS) 1.4 % ophthalmic solution Place 1 drop into both eyes 4 (four) times daily.    Yes [provider]  pravastatin (PRAVACHOL) 40 MG tablet TAKE 1 TABLET(40 MG) BY MOUTH DAILY 10/01/18  Yes Biagio Borg, MD  ALPRAZolam Duanne Moron) 0.25 MG tablet TAKE 1/2 -1 TABLET BY MOUTH 2 TIMES DAILY AS NEEDED Patient not taking: Reported on 11/06/2018 08/07/14   Biagio Borg, MD  feeding supplement, ENSURE COMPLETE, (ENSURE COMPLETE) LIQD Take 237 mLs by mouth 2 (two) times daily between meals. 08/23/13   Erby Pian, NP                                                                                                                                    Past Surgical History Past Surgical History:  Procedure Laterality Date  . ABDOMINAL HYSTERECTOMY    . CHOLECYSTECTOMY    . CYSTOCELE REPAIR N/A 11/26/2012   Procedure: Cystocele Repair with Lefort Colpocleisis.  colpectomy Levatorplasty Perineorraphy;  Surgeon: Lovenia Kim, MD;  Location:  [facility];  Service: Gynecology;  Laterality: N/A;  Cystocele Repair with Lefort Colpocleisis.  Marland Kitchen HIP ARTHROPLASTY Left 07/02/2013   Procedure: ARTHROPLASTY BIPOLAR HIP;  Surgeon: Mauri Pole, MD;  Location: [facility];  Service: Orthopedics;  Laterality: Left;  . INGUINAL HERNIA REPAIR Right 08/13/2013   Procedure: HERNIA REPAIR INGUINAL INCARCERATED;  Surgeon: Ralene Ok, MD;  Location: [facility];  Service: General;  Laterality: Right;  . Medtronic Cynthia dual chamber pacemaker serial number was [id number] H...04/15/2009    . s/p cerebral aneurysm  1991  . s/p pacemaker DDD    .  s/p retinal attachment    . s/p thyroidectomy     Family History Family History  Problem Relation Age of Onset  . Cancer Mother        breast cancer    Social History Social History   Tobacco Use  . Smoking status: Never Smoker  . Smokeless tobacco: Never Used  Substance Use Topics  . Alcohol use: No  . Drug use: No   Allergies Codeine  Review of Systems Review of Systems  Unable to perform ROS: Patient nonverbal    Physical Exam Vital Signs  I have reviewed the triage vital signs BP (!) 124/50   Pulse 60   Temp 97.8 F (36.6 C) (Temporal)   Resp (!) 25   Ht 5\' 5"  (1.651 m)   Wt 69.6 kg   SpO2 (!) 89%   BMI 25.53 kg/m   Physical Exam Vitals signs reviewed.  Constitutional:      General: She is not in acute distress.    Appearance: She is well-developed. She is not diaphoretic.  HENT:     Head: Normocephalic and atraumatic.     Nose: Nose normal.  Eyes:     General: No scleral icterus.       Right eye: No discharge.        Left eye: No discharge.     Conjunctiva/sclera: Conjunctivae normal.     Pupils: Pupils are equal, round, and reactive to light.  Neck:     Musculoskeletal: Normal range of motion and neck supple.  Cardiovascular:     Rate and Rhythm: Normal rate and regular rhythm.     Heart sounds: No murmur. No friction rub. No gallop.   Pulmonary:     Effort: Pulmonary  effort is normal. No respiratory distress.     Breath sounds: Normal breath sounds. No stridor. No rales.  Abdominal:     General: There is no distension.     Palpations: Abdomen is soft.     Tenderness: There is no abdominal tenderness.  Musculoskeletal:        General: No tenderness.  Skin:    General: Skin is warm and dry.     Findings: No erythema or rash.  Neurological:     Mental Status: She is alert.     Comments: Patient is nonverbal.  Does not follow commands.  Right-sided neglect.  Has spastic right upper extremity.  Moves left upper and lower, as well as right lower extremity to painful stimuli.  Detailed neuro exam by neurology.     ED Results and Treatments Labs (all labs ordered are listed, but only abnormal results are displayed) Labs Reviewed  COMPREHENSIVE METABOLIC PANEL - Abnormal; Notable for the following components:      Result Value   Potassium 3.1 (*)    Glucose, Bld 151 (*)    BUN 35 (*)    Creatinine, Ser 1.12 (*)    Calcium 8.3 (*)    Total Protein 6.4 (*)    GFR calc non Af Amer 42 (*)    GFR calc Af Amer 49 (*)    All other components within normal limits  I-STAT CREATININE, ED - Abnormal; Notable for the following components:   Creatinine, Ser 1.10 (*)    All other components within normal limits  CBG MONITORING, ED - Abnormal; Notable for the following components:   Glucose-Capillary 134 (*)    All other components within normal limits  ETHANOL  PROTIME-INR  APTT  CBC  DIFFERENTIAL  RAPID URINE DRUG SCREEN, HOSP PERFORMED  URINALYSIS, ROUTINE W REFLEX MICROSCOPIC  CBC  HEMOGLOBIN A1C  LIPID PANEL                                                                                                                         EKG  EKG Interpretation  Date/Time:  Monday December 03 2018 01:35:10 EDT Ventricular Rate:  62 PR Interval:    QRS Duration: 206 QT Interval:  541 QTC Calculation: 550 R Axis:   -77 Text Interpretation:  Sinus rhythm  Borderline prolonged PR interval IVCD, consider atypical RBBB LVH with IVCD and secondary repol abnrm Prolonged QT interval No significant change since last tracing Confirmed by Addison Lank [phone]) on 12/03/2018 1:58:59 AM      Radiology Ct Angio Head W Or Wo Contrast  Result Date: 12/03/2018 CLINICAL DATA:  Facial droop.  Nonverbal. EXAM: CT ANGIOGRAPHY HEAD AND NECK CT PERFUSION BRAIN TECHNIQUE: Multidetector CT imaging of the head and neck was performed using the standard protocol during bolus administration of intravenous contrast. Multiplanar CT image reconstructions and MIPs were obtained to evaluate the vascular anatomy. Carotid stenosis measurements (when applicable) are obtained utilizing NASCET criteria, using the distal internal carotid diameter as the denominator. Multiphase CT imaging of the brain was performed following IV bolus contrast injection. Subsequent parametric perfusion maps were calculated using RAPID software. CONTRAST:  152mL OMNIPAQUE IOHEXOL 350 MG/ML SOLN, 26mL ISOVUE-370 IOPAMIDOL (ISOVUE-370) INJECTION 76%; 116mL OMNIPAQUE IOHEXOL 350 MG/ML SOLN COMPARISON:  CT HEAD December 03, 2018 FINDINGS: CTA NECK FINDINGS: AORTIC ARCH: 3.9 cm ascending aorta. Moderate calcific atherosclerosis. The origins of the innominate, left Common carotid artery and subclavian artery are patent. RIGHT CAROTID SYSTEM: Common carotid artery is patent, trace calcific atherosclerosis. Moderate calcific atherosclerosis of the carotid bifurcation without hemodynamically significant stenosis by NASCET criteria. Normal appearance of the internal carotid artery. LEFT CAROTID SYSTEM: Common carotid artery is patent. Mild calcific atherosclerosis of the carotid bifurcation without hemodynamically significant stenosis by NASCET criteria. Normal appearance of the internal carotid artery. VERTEBRAL ARTERIES:Left vertebral artery is dominant. Normal appearance of the vertebral arteries, widely patent. SKELETON: No  acute osseous process though bone windows have not been submitted. OTHER NECK: Soft tissues of the neck are nonacute though, not tailored for evaluation. UPPER CHEST: Mosaic lung attenuation seen with small airway disease and/or pulmonary edema. Narrowed tracheobronchial tree, possible malacia. Streak artifact from LEFT cardiac pacemaker battery pack. CTA HEAD FINDINGS: ANTERIOR CIRCULATION: Patent cervical internal carotid arteries, petrous, cavernous and supra clinoid internal carotid arteries. Severe LEFT and moderate RIGHT stenosis supraclinoid ICA's. Patent anterior communicating artery. Gradual loss of LEFT A2 contrast opacification, poor collateralization. Patent RIGHT ACA. Irregular LEFT MCA, occluded at aneurysm clip with intermediate collaterals. Patent RIGHT middle cerebral artery, severe stenosis RIGHT M2 segments. No contrast extravasation or aneurysm. POSTERIOR CIRCULATION: Patent vertebral arteries, vertebrobasilar junction and basilar artery, as well as main branch vessels. Patent posterior cerebral arteries, moderate luminal irregularity compatible with atherosclerosis. Small  LEFT posterior communicating artery. No large vessel occlusion, flow-limiting stenosis, contrast extravasation or aneurysm. VENOUS SINUSES: Major dural venous sinuses are patent though not tailored for evaluation on this angiographic examination. ANATOMIC VARIANTS: None. DELAYED PHASE: Not performed. MIP images reviewed. CT Brain Perfusion Findings: Rapid data processing failed. IMPRESSION: CTA NECK: 1. No hemodynamically significant stenosis ICA's. 2. Patent vertebral arteries. 3. **An incidental finding of potential clinical significance has been found. 3.9 cm ascending aorta. Recommend annual imaging followup by CTA or MRA. This recommendation follows 2010 ACCF/AHA/AATS/ACR/ASA/SCA/SCAI/SIR/STS/SVM Guidelines for the Diagnosis and Management of Patients with Thoracic Aortic Disease. Circulation.2010; 121: Z660-Y301. Aortic  aneurysm NOS (ICD10-I71.9)** CTA HEAD: 1. Emergent LEFT A2 occlusion, poor collateralization by single-phase CTA. 2. Severe LEFT and moderate RIGHT supraclinoid ICA stenosis. 3. Occluded LEFT MCA at aneurysm clip, intermediate collaterals by single-phase CTA. CT PERFUSION: 1. Rapid data processing failed. Critical Value/emergent results text paged to Dr.MCNEILL [facility] via AMION secure system on 12/03/2018 at 1:35 am, including interpreting physician's phone number. Perfusion failure discussed with and reconfirmed by Dr.MCNEILL [facility] on 12/03/2018 at 2:20 am. Electronically Signed   By: Elon Alas M.D.   On: 12/03/2018 02:24   Ct Angio Neck W Or Wo Contrast  Result Date: 12/03/2018 CLINICAL DATA:  Facial droop.  Nonverbal. EXAM: CT ANGIOGRAPHY HEAD AND NECK CT PERFUSION BRAIN TECHNIQUE: Multidetector CT imaging of the head and neck was performed using the standard protocol during bolus administration of intravenous contrast. Multiplanar CT image reconstructions and MIPs were obtained to evaluate the vascular anatomy. Carotid stenosis measurements (when applicable) are obtained utilizing NASCET criteria, using the distal internal carotid diameter as the denominator. Multiphase CT imaging of the brain was performed following IV bolus contrast injection. Subsequent parametric perfusion maps were calculated using RAPID software. CONTRAST:  133mL OMNIPAQUE IOHEXOL 350 MG/ML SOLN, 17mL ISOVUE-370 IOPAMIDOL (ISOVUE-370) INJECTION 76%; 145mL OMNIPAQUE IOHEXOL 350 MG/ML SOLN COMPARISON:  CT HEAD December 03, 2018 FINDINGS: CTA NECK FINDINGS: AORTIC ARCH: 3.9 cm ascending aorta. Moderate calcific atherosclerosis. The origins of the innominate, left Common carotid artery and subclavian artery are patent. RIGHT CAROTID SYSTEM: Common carotid artery is patent, trace calcific atherosclerosis. Moderate calcific atherosclerosis of the carotid bifurcation without hemodynamically significant stenosis by NASCET criteria.  Normal appearance of the internal carotid artery. LEFT CAROTID SYSTEM: Common carotid artery is patent. Mild calcific atherosclerosis of the carotid bifurcation without hemodynamically significant stenosis by NASCET criteria. Normal appearance of the internal carotid artery. VERTEBRAL ARTERIES:Left vertebral artery is dominant. Normal appearance of the vertebral arteries, widely patent. SKELETON: No acute osseous process though bone windows have not been submitted. OTHER NECK: Soft tissues of the neck are nonacute though, not tailored for evaluation. UPPER CHEST: Mosaic lung attenuation seen with small airway disease and/or pulmonary edema. Narrowed tracheobronchial tree, possible malacia. Streak artifact from LEFT cardiac pacemaker battery pack. CTA HEAD FINDINGS: ANTERIOR CIRCULATION: Patent cervical internal carotid arteries, petrous, cavernous and supra clinoid internal carotid arteries. Severe LEFT and moderate RIGHT stenosis supraclinoid ICA's. Patent anterior communicating artery. Gradual loss of LEFT A2 contrast opacification, poor collateralization. Patent RIGHT ACA. Irregular LEFT MCA, occluded at aneurysm clip with intermediate collaterals. Patent RIGHT middle cerebral artery, severe stenosis RIGHT M2 segments. No contrast extravasation or aneurysm. POSTERIOR CIRCULATION: Patent vertebral arteries, vertebrobasilar junction and basilar artery, as well as main branch vessels. Patent posterior cerebral arteries, moderate luminal irregularity compatible with atherosclerosis. Small LEFT posterior communicating artery. No large vessel occlusion, flow-limiting stenosis, contrast extravasation or aneurysm. VENOUS SINUSES: Major dural venous sinuses  are patent though not tailored for evaluation on this angiographic examination. ANATOMIC VARIANTS: None. DELAYED PHASE: Not performed. MIP images reviewed. CT Brain Perfusion Findings: Rapid data processing failed. IMPRESSION: CTA NECK: 1. No hemodynamically  significant stenosis ICA's. 2. Patent vertebral arteries. 3. **An incidental finding of potential clinical significance has been found. 3.9 cm ascending aorta. Recommend annual imaging followup by CTA or MRA. This recommendation follows 2010 ACCF/AHA/AATS/ACR/ASA/SCA/SCAI/SIR/STS/SVM Guidelines for the Diagnosis and Management of Patients with Thoracic Aortic Disease. Circulation.2010; 121: P619-J093. Aortic aneurysm NOS (ICD10-I71.9)** CTA HEAD: 1. Emergent LEFT A2 occlusion, poor collateralization by single-phase CTA. 2. Severe LEFT and moderate RIGHT supraclinoid ICA stenosis. 3. Occluded LEFT MCA at aneurysm clip, intermediate collaterals by single-phase CTA. CT PERFUSION: 1. Rapid data processing failed. Critical Value/emergent results text paged to Dr.MCNEILL [facility] via AMION secure system on 12/03/2018 at 1:35 am, including interpreting physician's phone number. Perfusion failure discussed with and reconfirmed by Dr.MCNEILL [facility] on 12/03/2018 at 2:20 am. Electronically Signed   By: Elon Alas M.D.   On: 12/03/2018 02:24   Ct Cerebral Perfusion W Contrast  Result Date: 12/03/2018 CLINICAL DATA:  Facial droop.  Nonverbal. EXAM: CT ANGIOGRAPHY HEAD AND NECK CT PERFUSION BRAIN TECHNIQUE: Multidetector CT imaging of the head and neck was performed using the standard protocol during bolus administration of intravenous contrast. Multiplanar CT image reconstructions and MIPs were obtained to evaluate the vascular anatomy. Carotid stenosis measurements (when applicable) are obtained utilizing NASCET criteria, using the distal internal carotid diameter as the denominator. Multiphase CT imaging of the brain was performed following IV bolus contrast injection. Subsequent parametric perfusion maps were calculated using RAPID software. CONTRAST:  143mL OMNIPAQUE IOHEXOL 350 MG/ML SOLN, 28mL ISOVUE-370 IOPAMIDOL (ISOVUE-370) INJECTION 76%; 136mL OMNIPAQUE IOHEXOL 350 MG/ML SOLN COMPARISON:  CT HEAD December 03, 2018 FINDINGS: CTA NECK FINDINGS: AORTIC ARCH: 3.9 cm ascending aorta. Moderate calcific atherosclerosis. The origins of the innominate, left Common carotid artery and subclavian artery are patent. RIGHT CAROTID SYSTEM: Common carotid artery is patent, trace calcific atherosclerosis. Moderate calcific atherosclerosis of the carotid bifurcation without hemodynamically significant stenosis by NASCET criteria. Normal appearance of the internal carotid artery. LEFT CAROTID SYSTEM: Common carotid artery is patent. Mild calcific atherosclerosis of the carotid bifurcation without hemodynamically significant stenosis by NASCET criteria. Normal appearance of the internal carotid artery. VERTEBRAL ARTERIES:Left vertebral artery is dominant. Normal appearance of the vertebral arteries, widely patent. SKELETON: No acute osseous process though bone windows have not been submitted. OTHER NECK: Soft tissues of the neck are nonacute though, not tailored for evaluation. UPPER CHEST: Mosaic lung attenuation seen with small airway disease and/or pulmonary edema. Narrowed tracheobronchial tree, possible malacia. Streak artifact from LEFT cardiac pacemaker battery pack. CTA HEAD FINDINGS: ANTERIOR CIRCULATION: Patent cervical internal carotid arteries, petrous, cavernous and supra clinoid internal carotid arteries. Severe LEFT and moderate RIGHT stenosis supraclinoid ICA's. Patent anterior communicating artery. Gradual loss of LEFT A2 contrast opacification, poor collateralization. Patent RIGHT ACA. Irregular LEFT MCA, occluded at aneurysm clip with intermediate collaterals. Patent RIGHT middle cerebral artery, severe stenosis RIGHT M2 segments. No contrast extravasation or aneurysm. POSTERIOR CIRCULATION: Patent vertebral arteries, vertebrobasilar junction and basilar artery, as well as main branch vessels. Patent posterior cerebral arteries, moderate luminal irregularity compatible with atherosclerosis. Small LEFT posterior  communicating artery. No large vessel occlusion, flow-limiting stenosis, contrast extravasation or aneurysm. VENOUS SINUSES: Major dural venous sinuses are patent though not tailored for evaluation on this angiographic examination. ANATOMIC VARIANTS: None. DELAYED PHASE: Not performed. MIP images reviewed.  CT Brain Perfusion Findings: Rapid data processing failed. IMPRESSION: CTA NECK: 1. No hemodynamically significant stenosis ICA's. 2. Patent vertebral arteries. 3. **An incidental finding of potential clinical significance has been found. 3.9 cm ascending aorta. Recommend annual imaging followup by CTA or MRA. This recommendation follows 2010 ACCF/AHA/AATS/ACR/ASA/SCA/SCAI/SIR/STS/SVM Guidelines for the Diagnosis and Management of Patients with Thoracic Aortic Disease. Circulation.2010; 121: D322-G254. Aortic aneurysm NOS (ICD10-I71.9)** CTA HEAD: 1. Emergent LEFT A2 occlusion, poor collateralization by single-phase CTA. 2. Severe LEFT and moderate RIGHT supraclinoid ICA stenosis. 3. Occluded LEFT MCA at aneurysm clip, intermediate collaterals by single-phase CTA. CT PERFUSION: 1. Rapid data processing failed. Critical Value/emergent results text paged to Dr.MCNEILL [facility] via AMION secure system on 12/03/2018 at 1:35 am, including interpreting physician's phone number. Perfusion failure discussed with and reconfirmed by Dr.MCNEILL [facility] on 12/03/2018 at 2:20 am. Electronically Signed   By: Elon Alas M.D.   On: 12/03/2018 02:24   Ct Head Code Stroke Wo Contrast  Result Date: 12/03/2018 CLINICAL DATA:  Code stroke.  Facial droop.  Nonverbal. EXAM: CT HEAD WITHOUT CONTRAST TECHNIQUE: Contiguous axial images were obtained from the base of the skull through the vertex without intravenous contrast. COMPARISON:  CT HEAD January 11, 2015 FINDINGS: BRAIN: Mesial LEFT frontal blurring of the gray-white matter junction. No intraparenchymal hemorrhage, mass effect nor midline shift. LEFT frontotemporoparietal  cystic encephalomalacia. Old basal ganglia and bilateral thalami small infarcts. Old LEFT > RIGHT cerebellar infarcts. Confluent supratentorial white matter hypodensities. Ex vacuo dilatation LEFT lateral ventricle. No hydrocephalus. Scattered punctate parenchymal versus extra-axial calcifications. No abnormal extra-axial fluid collections. Basal cisterns are patent. VASCULAR: Moderate to severe calcific atherosclerosis of the carotid siphons. Status post clipping of LEFT MCA bifurcation aneurysm. SKULL: No skull fracture. Severe bilateral temporomandibular osteoarthrosis. Old LEFT frontal craniotomy. No significant scalp soft tissue swelling. SINUSES/ORBITS: Trace paranasal sinus mucosal thickening. Mastoid air cells are well aerated.The included ocular globes and orbital contents are non-suspicious. Status post bilateral ocular lens implants. OTHER: None. ASPECTS [facility] Stroke Program Early CT Score) - Ganglionic level infarction (caudate, lentiform nuclei, internal capsule, insula, M1-M3 cortex): 7 - Supraganglionic infarction (M4-M6 cortex): 3 Total score (0-10 with 10 being normal): 10 IMPRESSION: 1. Acute nonhemorrhagic LEFT frontal/ACA territory infarct. 2. Old large LEFT MCA territory infarct, status post clipping of LEFT MCA aneurysm. 3. Moderate to severe chronic small vessel ischemic changes. Old small basal ganglia, thalami and cerebellar infarcts. 4. ASPECTS is 10. 5. Critical Value/emergent results were called by telephone at the time of interpretation on 12/03/2018 at 1:22 am to Dr. Leonel Ramsay, Neurology, who verbally acknowledged these results. Electronically Signed   By: Elon Alas M.D.   On: 12/03/2018 01:23   Pertinent labs & imaging results that were available during my care of the patient were reviewed by me and considered in my medical decision making (see chart for details).  Medications Ordered in ED Medications  alteplase (ACTIVASE) 1 mg/mL infusion 62.6 mg (62.6 mg Intravenous  New Bag/Given 12/03/18 0217)    Followed by  0.9 %  sodium chloride infusion (has no administration in time range)   stroke: mapping our early stages of recovery book (has no administration in time range)  0.9 %  sodium chloride infusion (has no administration in time range)  acetaminophen (TYLENOL) tablet 650 mg (has no administration in time range)    Or  acetaminophen (TYLENOL) solution 650 mg (has no administration in time range)    Or  acetaminophen (TYLENOL) suppository 650 mg (has no administration in  time range)  labetalol (NORMODYNE,TRANDATE) injection 10 mg (has no administration in time range)    And  nicardipine (CARDENE) 20mg  in 0.86% saline 277ml IV infusion (0.1 mg/ml) (has no administration in time range)  pantoprazole (PROTONIX) injection 40 mg (has no administration in time range)  iohexol (OMNIPAQUE) 350 MG/ML injection 100 mL (100 mLs Intravenous Contrast Given 12/03/18 0130)  iopamidol (ISOVUE-370) 76 % injection 40 mL (40 mLs Intravenous Contrast Given 12/03/18 0205)  0.9 %  sodium chloride infusion ( Intravenous New Bag/Given 12/03/18 0215)                                                                                                                                    Procedures Procedures CRITICAL CARE Performed by: Grayce Sessions Adonis Yim Total critical care time: 30 minutes Critical care time was exclusive of separately billable procedures and treating other patients. Critical care was necessary to treat or prevent imminent or life-threatening deterioration. Critical care was time spent personally by me on the following activities: development of treatment plan with patient and/or surrogate as well as nursing, discussions with consultants, evaluation of patient's response to treatment, examination of patient, obtaining history from patient or surrogate, ordering and performing treatments and interventions, ordering and review of laboratory studies, ordering and review of  radiographic studies, pulse oximetry and re-evaluation of patient's condition.   (including critical care time)  Medical Decision Making / ED Course I have reviewed the nursing notes for this encounter and the patient's prior records (if available in EHR or on provided paperwork).    Code stroke.  Patient is hemodynamically stable.  Taken emergently to CT scanner.  Neurology at bedside.  They were able to confirm the last known normal with the facility.  Patient will be treated with TPA and admitted to the neuro ICU.  Final Clinical Impression(s) / ED Diagnoses Final diagnoses:  Acute ischemic left MCA stroke [facility])      This chart was dictated using voice recognition software.  Despite best efforts to proofread,  errors can occur which can change the documentation meaning.   Fatima Blank, MD 12/03/18 [phone]

## 2018-12-03 NOTE — Progress Notes (Addendum)
DR Aroor not on call - notified Dr Erlinda Hong of change in NIH score face to face  with Lt leg being weaker - see my previous note

## 2018-12-03 NOTE — Telephone Encounter (Signed)
Dr. Erlinda Hong,  The only reason she was not on systemic anticoagulation is that she was told by neurology years ago that she was not a candidate for systemic anti-coagulation. I defer to your judgement. She is very pleasant but does have baseline dementia which has been progressing.  GT

## 2018-12-03 NOTE — H&P (Addendum)
Neurology H&P  CC: Right-sided weakness and aphasia  History is obtained from: Patient's son, assisted living staff  HPI: [name] is a 85 y.o. female with a history of stroke, aneurysm treated with clipping in 1993 as well as previous ocular stroke in 2005 who presents with right-sided weakness and aphasia.  She apparently had some mild baseline aphasia, but was able to have a conversation, and was talking in her normal way when her nursing tech helped her change into pajamas around 9:45 PM.  She left the room just after 10 PM.  Another member the staff checked on her and found her to have right-sided weakness and aphasia around 1 AM.  When she initially arrived to the hospital, the last known well was 2:30 PM when the son had left her.  It was known that someone had helped her to bed, but not who and it was not known if she was normal at that time but they helped her to bed.  Shortly after at 2 AM the nursing home called back stating that they had located the person who helped her go to bed and I was able to speak with that person who stated that she was in her normal state of health, with normal speech at that time.   LKW: 10 PM tpa given?:  Yes Modified Rankin Scale: 3-Moderate disability-requires help but walks WITHOUT assistance  ROS: Unable to assess secondary to patient's altered mental status.    Past Medical History:  Diagnosis Date  . Aphasia 01/09/2009  . Arthritis    right hand  . 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 CEREBROVASCULAR ACCIDENT, HX OF 11/20/2007  . CHF (congestive heart failure) ([place])   . Coronary artery disease   . CVA 01/09/2009  . DEMENTIA 05/19/2008  . Dementia ([place])   . DIVERTICULOSIS, COLON 11/20/2007  . FATIGUE 11/20/2007  . Femoral fracture ([place]) 07/01/2013  . GLUCOSE INTOLERANCE 12/06/2010  . HYPERLIPIDEMIA 11/20/2007  . HYPERTENSION 11/20/2007   Echo was done 07/07/11: mod LVH, EF 55-60%, grade 1 diast dysfxn, mild MR, mild LAE, mod TR, PASP 39   . HYPOTHYROIDISM 11/20/2007  . Impaired glucose tolerance 06/03/2011  . IRON DEFICIENCY 12/06/2010  . Pacemaker 2002  . PACEMAKER, PERMANENT 01/09/2009  . TIA 05/14/2003   elevated CE's in setting of TIA and falls in 10/12 - echo normal with no further cardiac w/u pursued  . TIA (transient ischemic attack) 10/10/2011  . Urinary incontinence 01/09/2009     Family History  Problem Relation Age of Onset  . Cancer Mother        breast cancer     Social History:  reports that she has never smoked. She has never used smokeless tobacco. She reports that she does not drink alcohol or use drugs.   Exam: Current vital signs: BP (!) 124/54   Pulse 61   Temp 97.8 F (36.6 C) (Temporal)   Resp (!) 22   Ht 5\' 5"  (1.651 m)   Wt 69.6 kg   SpO2 90%   BMI 25.53 kg/m  Vital signs in last 24 hours: Temp:  [97.8 F (36.6 C)] 97.8 F (36.6 C) (03/09 0139) Pulse Rate:  [59-61] 61 (03/09 0230) Resp:  [19-28] 22 (03/09 0230) BP: (123-130)/(54-60) 124/54 (03/09 0230) SpO2:  [90 %-92 %] 90 % (03/09 0230) Weight:  [69.6 kg] 69.6 kg (03/09 0209)  Physical Exam  Constitutional: Appears well-developed and well-nourished.  Psych: Affect appropriate to situation Eyes: No scleral injection HENT: No  OP obstrucion Head: Normocephalic.  Cardiovascular: Normal rate and regular rhythm.  Respiratory: Effort normal and breath sounds normal to anterior ascultation GI: Soft.  No distension. There is no tenderness.  Skin: WDI  Neuro: Mental Status: Patient is awake, alert, though she does tell me her name at the bridge, there is no other speech and she does not follow commands. Cranial Nerves: II: She has a right hemianopia pupils are equal, round, and reactive to light.   III,IV, VI: EOMI without ptosis or diploplia.  V: Facial sensation is symmetric to temperature VII: Facial movement with mild right facial weakness VIII: hearing is intact to voice Motor: She has a severe right hemiparesis with  triple flexion of the right leg and 3/5 strength of the right arm.  She moves the left side well Sensory: Sensation is diminished on the right Cerebellar: She does not perform.   I have reviewed labs in epic and the results pertinent to this consultation are: CMP- hypokalemia at 3.1 CBC-unremarkable Creatinine 1.1  I have reviewed the images obtained: CTA-left A2 occlusion, CT early changes in the left ACA distribution, but not the total area.  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  She is just within the IV TPA window, though on arrival she did not have a clear last known well and it took quite a while to establish this, she did receive IV TPA.  I discussed the risks of TPA with her son including possibly increased risk due to her previous aneurysm as well as the fact that there was already some stroke appearing on CT.  Despite these risks, given the severity of her aphasia I did offer IV TPA and he consented.  With a modified Rankin of 3 and early CT change, I do not think she would be a good IR candidate, and I discussed this with the son who agreed that she would not want invasive procedures requiring general anesthesia.  Recommendations: - HgbA1c, fasting lipid panel - MRI, MRA  of the brain without contrast - Frequent neuro checks - Echocardiogram - Carotid dopplers - Prophylactic therapy-Antiplatelet med: Aspirin - dose 325mg  PO or 300mg  PR - Risk factor modification - Telemetry monitoring - PT consult, OT consult, Speech consult - Stroke team to follow    This patient is critically ill and at significant risk of neurological worsening, death and care requires constant monitoring of vital signs, hemodynamics,respiratory and cardiac monitoring, neurological assessment, discussion with family, other specialists and  medical decision making of high complexity. I spent 50 minutes of neurocritical care time  in the care of  this patient. This was time spent independent of any time provided by nurse practitioner or PA.  Roland Rack, MD [facility] [phone]  If 7pm- 7am, please page neurology on call as listed in [place]. 12/03/2018  2:35 AM

## 2018-12-03 NOTE — Evaluation (Signed)
Physical Therapy Evaluation Patient Details Name: [name] MRN: [id number] DOB: 06-02-1922 Today's Date: 12/03/2018   History of Present Illness  85 y.o. female admitted on 12/02/18 for R sided weakness and aphasia.  Pt give IV tPA.  Pt dx with L frontal/ACA territory infarct likely due to A-fib.  Pt with other significant PMH of TIA, pacemaker, HTN, femoral fx s/p L hip arthroplasty, dementia, CAD, CHF, arthritis (particularly in her hands), s/p cerebral aneurysm s/p coiling.    Clinical Impression  Pt sat EOB with PT for ~15 mins working on midline sitting balance, following commands and tolerance of upright positioning. VSS throughout and BP within stated parameters by Dr. Phoebe Sharps note.  This pt, despite her age, was ambulatory and participated in her self care (despite needing help from aids at her independent living apartment).  She would benefit from post acute rehab to help her achieve her goal of ambulating again and returning to [place].    I have discussed the patient's current level of function related to her stroke with the patient and her son/daughter in law.  They acknowledge understanding of this and do not feel the patient would be able to have their care needs met at home.  They are interested in post-acute rehab in an inpatient setting.    PT to follow acutely for deficits listed below.      Follow Up Recommendations CIR    Equipment Recommendations  None recommended by PT    Recommendations for Other Services Rehab consult     Precautions / Restrictions Precautions Precautions: Fall Precaution Comments: right side weakness      Mobility  Bed Mobility Overal bed mobility: Needs Assistance Bed Mobility: Supine to Sit;Sit to Supine     Supine to sit: Max assist;HOB elevated Sit to supine: Max assist;HOB elevated   General bed mobility comments: Max assist to help initiate movement of legs to EOB and support trunk and hips to come to EOB.  HOB elevated to  assist with supporting trunk during transitions.  Pt assisting a little with both transitions at her trunk and using her left arm/elbow.        Modified Rankin (Stroke Patients Only) Modified Rankin (Stroke Patients Only) Pre-Morbid Rankin Score: Moderate disability Modified Rankin: Severe disability     Balance Overall balance assessment: Needs assistance Sitting-balance support: Feet supported;Single extremity supported Sitting balance-[name] Scale: Poor Sitting balance - Comments: up to max assist EOB due to pushing to the right with her strong left hand.  Once left hand support removed she can be as good as min/mod assist in sitting, but cannot support herself as therapist attempted to remove support from her trunk.  Postural control: Right lateral lean(right lateral push)                                   Pertinent Vitals/Pain Pain Assessment: Faces Faces Pain Scale: No hurt    Home Living Family/patient expects to be discharged to:: Private residence Living Arrangements: Alone Available Help at Discharge: Personal care attendant;Available 24 hours/day Type of Home: Independent living facility([facility]) Home Access: Level entry     Home Layout: One level Home Equipment: Walker - 4 wheels      Prior Function Level of Independence: Needs assistance   Gait / Transfers Assistance Needed: walks with a rollator her "buggy"  ADL's / Homemaking Assistance Needed: assist from aids  Hand Dominance   Dominant Hand: Right    Extremity/Trunk Assessment   Upper Extremity Assessment Upper Extremity Assessment: Defer to OT evaluation    Lower Extremity Assessment Lower Extremity Assessment: RLE deficits/detail RLE Deficits / Details: right leg with decreased strength compared to the left leg.  Weak spontaneous movment of the ankle knee.      Cervical / Trunk Assessment Cervical / Trunk Assessment: Kyphotic  Communication   Communication:  HOH;Expressive difficulties  Cognition Arousal/Alertness: Awake/alert Behavior During Therapy: Flat affect Overall Cognitive Status: Impaired/Different from baseline Area of Impairment: Orientation;Attention;Memory;Following commands;Safety/judgement;Awareness;Problem solving                 Orientation Level: Time;Situation(knows her son and hospital) Current Attention Level: Focused   Following Commands: Follows one step commands inconsistently;Follows one step commands with increased time Safety/Judgement: Decreased awareness of deficits Awareness: Intellectual Problem Solving: Slow processing;Decreased initiation;Difficulty sequencing;Requires verbal cues;Requires tactile cues General Comments: Pt is very slow to process, needing 15-20 seconds of extra time, she is HOH also which doesn't increase her understanding of commands.  She inconsistantly will follow commands and usually more so on the left side, despite spontaneous movement of the right side.       General Comments General comments (skin integrity, edema, etc.): VSS throughout session and BP within stated parameters.         Assessment/Plan    PT Assessment Patient needs continued PT services  PT Problem List Decreased strength;Decreased activity tolerance;Decreased balance;Decreased mobility;Decreased cognition;Decreased coordination;Decreased knowledge of use of DME;Decreased safety awareness;Decreased knowledge of precautions       PT Treatment Interventions DME instruction;Gait training;Functional mobility training;Therapeutic activities;Therapeutic exercise;Balance training;Patient/family education    PT Goals (Current goals can be found in the Care Plan section)  Acute Rehab PT Goals Patient Stated Goal: pt wants to walk again PT Goal Formulation: With patient/family Time For Goal Achievement: 12/17/18 Potential to Achieve Goals: Good    Frequency Min 4X/week           AM-PAC PT "6 Clicks"  Mobility  Outcome Measure Help needed turning from your back to your side while in a flat bed without using bedrails?: Total Help needed moving from lying on your back to sitting on the side of a flat bed without using bedrails?: Total Help needed moving to and from a bed to a chair (including a wheelchair)?: Total Help needed standing up from a chair using your arms (e.g., wheelchair or bedside chair)?: Total Help needed to walk in hospital room?: Total Help needed climbing 3-5 steps with a railing? : Total 6 Click Score: 6    End of Session   Activity Tolerance: Patient tolerated treatment well Patient left: in bed;with call bell/phone within reach;with bed alarm set;with family/visitor present Nurse Communication: Mobility status PT Visit Diagnosis: Muscle weakness (generalized) (M62.81);Difficulty in walking, not elsewhere classified (R26.2);Hemiplegia and hemiparesis Hemiplegia - Right/Left: Right Hemiplegia - dominant/non-dominant: Dominant Hemiplegia - caused by: Cerebral infarction    Time: 4081-4481 PT Time Calculation (min) (ACUTE ONLY): 29 min   Charges:        Wells Guiles B. Shadd Dunstan, PT, DPT  Acute Rehabilitation [phone] pager #[phone] office   PT Evaluation $PT Eval Moderate Complexity: 1 Mod PT Treatments $Therapeutic Activity: 8-22 mins        12/03/2018, 5:58 PM

## 2018-12-03 NOTE — ED Triage Notes (Signed)
Patient is from [facility].  She was last seen normal at 0800 on 12/02/2018.  Patient is aphasic, with right sided weakness and right sided neglect/deviation to the left.  Patient has had prior TIAs and had a brain aneurysm in 1993.

## 2018-12-04 ENCOUNTER — Inpatient Hospital Stay (HOSPITAL_COMMUNITY): Payer: Medicare Other

## 2018-12-04 LAB — CBC
HCT: 37.9 % (ref 36.0–46.0)
Hemoglobin: 12.9 g/dL (ref 12.0–15.0)
MCH: 32.3 pg (ref 26.0–34.0)
MCHC: 34 g/dL (ref 30.0–36.0)
MCV: 94.8 fL (ref 80.0–100.0)
Platelets: 226 10*3/uL (ref 150–400)
RBC: 4 MIL/uL (ref 3.87–5.11)
RDW: 12.8 % (ref 11.5–15.5)
WBC: 7.5 10*3/uL (ref 4.0–10.5)
nRBC: 0 % (ref 0.0–0.2)

## 2018-12-04 LAB — BASIC METABOLIC PANEL
Anion gap: 9 (ref 5–15)
BUN: 19 mg/dL (ref 8–23)
CO2: 19 mmol/L — ABNORMAL LOW (ref 22–32)
Calcium: 8 mg/dL — ABNORMAL LOW (ref 8.9–10.3)
Chloride: 107 mmol/L (ref 98–111)
Creatinine, Ser: 0.89 mg/dL (ref 0.44–1.00)
GFR calc Af Amer: >60 mL/min (ref >60)
GFR calc non Af Amer: 55 mL/min — ABNORMAL LOW (ref >60)
Glucose, Bld: 116 mg/dL — ABNORMAL HIGH (ref 70–99)
Potassium: 3.6 mmol/L (ref 3.5–5.1)
Sodium: 135 mmol/L (ref 135–145)

## 2018-12-04 LAB — URINALYSIS, COMPLETE (UACMP) WITH MICROSCOPIC
Bilirubin Urine: NEGATIVE
Glucose, UA: NEGATIVE mg/dL
Ketones, ur: NEGATIVE mg/dL
Nitrite: NEGATIVE
PROTEIN: NEGATIVE mg/dL
Specific Gravity, Urine: 1.008 (ref 1.005–1.030)
pH: 6 (ref 5.0–8.0)

## 2018-12-04 LAB — GLUCOSE, CAPILLARY
Glucose-Capillary: 113 mg/dL — ABNORMAL HIGH (ref 70–99)
Glucose-Capillary: 105 mg/dL — ABNORMAL HIGH (ref 70–99)
Glucose-Capillary: 130 mg/dL — ABNORMAL HIGH (ref 70–99)
Glucose-Capillary: 151 mg/dL — ABNORMAL HIGH (ref 70–99)

## 2018-12-04 MED ORDER — AMLODIPINE BESYLATE 10 MG PO TABS
10.0000 mg | ORAL_TABLET | Freq: Every day | ORAL | Status: DC
  Administered 2018-12-04 – 2018-12-07 (×4): 10 mg via ORAL
  Filled 2018-12-04 (×4): qty 1

## 2018-12-04 MED ORDER — LOSARTAN POTASSIUM 50 MG PO TABS
100.0000 mg | ORAL_TABLET | Freq: Every day | ORAL | Status: DC
  Administered 2018-12-04 – 2018-12-07 (×4): 100 mg via ORAL
  Filled 2018-12-04 (×5): qty 2

## 2018-12-04 MED ORDER — PANTOPRAZOLE SODIUM 40 MG PO TBEC
40.0000 mg | DELAYED_RELEASE_TABLET | Freq: Every day | ORAL | Status: DC
  Administered 2018-12-04 – 2018-12-06 (×3): 40 mg via ORAL
  Filled 2018-12-04 (×3): qty 1

## 2018-12-04 MED ORDER — FUROSEMIDE 10 MG/ML IJ SOLN
40.0000 mg | Freq: Once | INTRAMUSCULAR | Status: AC
  Administered 2018-12-04: 40 mg via INTRAVENOUS
  Filled 2018-12-04: qty 4

## 2018-12-04 MED ORDER — ADULT MULTIVITAMIN W/MINERALS CH
1.0000 | ORAL_TABLET | Freq: Every morning | ORAL | Status: DC
  Administered 2018-12-04 – 2018-12-07 (×4): 1 via ORAL
  Filled 2018-12-04 (×4): qty 1

## 2018-12-04 NOTE — Progress Notes (Signed)
Late entry from 12/04/18  Speech Language Pathology Treatment: Dysphagia  Patient Details Name: [name] MRN: [id number] DOB: 1921/11/07 Today's Date: 12/04/2018 Time: 1210-1230 SLP Time Calculation (min) (ACUTE ONLY): 20 min  Assessment / Plan / Recommendation Clinical Impression  Assisted pt with portion of lunch - today, she is dyspneic with RR reaching into 30s.  Demonstrates increased coughing after consumption of thin liquids, concerning for potential aspiration that may be related to tachypnea and impact on swallowing coordination. D/W family, RN, MD.  Will make NPO and plan for MBS this afternoon.  HPI HPI: 85 y.o. female with history of a permanent pacemaker for complete AV block, previous strokes, cerebral aneurysm clipping in 1993, hypothyroidism, hypertension, hyperlipidemia, dementia, glucose intolerance, coronary artery disease, and congestive heart failure presented with right-sided weakness and aphasia.  CT head - Acute nonhemorrhagic LEFT frontal/ACA territory infarct; TPA started 12/03/18. PTA, pt resided at [facility] with assistance from personal caregivers. She has baseline mild dementia and mild aphasia, but is normally interactive and conversational.       SLP Plan  Continue with current plan of care;MBS       Recommendations  Diet recommendations: NPO(except meds crushed in puree)                SLP Visit Diagnosis: Dysphagia, unspecified (R13.10) Plan: Continue with current plan of care;MBS       GO                [name] 12/04/2018, 1:24 PM  [place] [place] L. Tivis Ringer, [place] Office number [phone] Pager [phone]

## 2018-12-04 NOTE — Plan of Care (Signed)
  Problem: Education: Goal: Knowledge of disease or condition will improve Outcome: Progressing   Problem: Coping: Goal: Will verbalize positive feelings about self Outcome: Progressing   Problem: Self-Care: Goal: Verbalization of feelings and concerns over difficulty with self-care will improve Outcome: Progressing Goal: Ability to communicate needs accurately will improve Outcome: Progressing   Problem: Nutrition: Goal: Risk of aspiration will decrease Outcome: Progressing Goal: Dietary intake will improve Outcome: Progressing

## 2018-12-04 NOTE — PMR Pre-admission (Signed)
[facility] PMR Admission Coordinator Pre-Admission Assessment  Patient: [name] is an 85 y.o., female MRN: [id number] DOB: Mar 26, 1922 Height: '5\' 5"'$  (165.1 cm) Weight: 69.6 kg  Insurance Information HMO:     PPO:      PCP:      IPA:      80/20: Yes     OTHER:  PRIMARY: Medicare A and B      Policy#: [id number]      Subscriber: Patient CM Name:       Phone#:      Fax#:  Pre-Cert#:       Employer:  Benefits:  Phone #: NA     Name: verified eligibility via [place] on 12/04/18 Eff. Date: Part A: 08/26/89; Part B: 08/26/89     Deduct: $1,408      Out of Pocket Max: NA      Life Max: NA CIR: Covered per Medicare Guidelines once yearly deductible is met.      SNF: days 1-20, 100%, days 21-100, 80%  Outpatient: 80%     Co-Pay: 20% Home Health: 100%      Co-Pay:  DME: 80%     Co-Pay: 20% Providers: Pt's choice SECONDARY: BCBS      Policy#: [id number]      Subscriber: Patient CM Name:       Phone#:      Fax#:  Pre-Cert#:       Employer:  Benefits:  Phone #: [phone]     Name:  Eff. Date:      Deduct:       Out of Pocket Max:       Life Max:  CIR:       SNF:  Outpatient:      Co-Pay:  Home Health:      Co-Pay:  DME:     Co-Pay:   Medicaid Application Date:       Case Manager:  Disability Application Date:       Case Worker:   Emergency Contact Information Contact Information    Name Relation Home Work Mobile   [place] Relative   [phone]   [name], [name]   [phone]      Current Medical History  Patient Admitting Diagnosis: LEFT frontal/ACA territory infarct History of Present Illness: Pt is a 84 yo F with history of stroke and prior aphasia (still able to have conversation per family), CHF, CAD, dementia, pacemaker placement, and urinary incontinence (see PMH for more details). Pt presented from ILF at [facility] with right sided weakness and aphasia. Pt received IV TPA on Monday 12/03/18. CT showed acute nonhomorrhagic Left frontal/ACA  territory infarct with multiple old infarcts. Repeat CT showed an evolving acute Left frontal/ACA territory infarct with new hemorrhagic conversion. Pt had 2D echo with EF 55%. Pt currently on no antithrombotic given hermorrhagic conversion. DOAC to be discussed after follow up CT in one week. Hospital course also notable for respiratory distress with sleep apnea pattern and a CXR on 3/11 showing improved infiltration or atelectasis in the left lung base. Pt is afebrile and WBCs 8.5. Pt has worked well with PT, OT, and SLP. Pt currently on a DYS 1 diet with thin liquids. Therapies are recommending CIR. Pt is to be admitted to CIR on 12/07/18.   Patient's medical record from [facility] has been reviewed by the rehabilitation admission coordinator and physician.     Past Medical History  Past Medical History:  Diagnosis Date  . Aphasia 01/09/2009  .  Arthritis    right hand  . 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 CEREBROVASCULAR ACCIDENT, HX OF 11/20/2007  . CHF (congestive heart failure) ([place])   . Coronary artery disease   . CVA 01/09/2009  . DEMENTIA 05/19/2008  . Dementia ([place])   . DIVERTICULOSIS, COLON 11/20/2007  . FATIGUE 11/20/2007  . Femoral fracture ([place]) 07/01/2013  . GLUCOSE INTOLERANCE 12/06/2010  . HYPERLIPIDEMIA 11/20/2007  . HYPERTENSION 11/20/2007   Echo was done 07/07/11: mod LVH, EF 55-60%, grade 1 diast dysfxn, mild MR, mild LAE, mod TR, PASP 39  . HYPOTHYROIDISM 11/20/2007  . Impaired glucose tolerance 06/03/2011  . IRON DEFICIENCY 12/06/2010  . Pacemaker 2002  . PACEMAKER, PERMANENT 01/09/2009  . TIA 05/14/2003   elevated CE's in setting of TIA and falls in 10/12 - echo normal with no further cardiac w/u pursued  . TIA (transient ischemic attack) 10/10/2011  . Urinary incontinence 01/09/2009    Family History   family history includes Cancer in her mother.  Prior Rehab/Hospitalizations Has the patient had major surgery during 100 days prior to admission?  No    Current Medications  Current Facility-Administered Medications:  .  acetaminophen (TYLENOL) tablet 650 mg, 650 mg, Oral, Q4H PRN **OR** acetaminophen (TYLENOL) solution 650 mg, 650 mg, Per Tube, Q4H PRN **OR** acetaminophen (TYLENOL) suppository 650 mg, 650 mg, Rectal, Q4H PRN, Greta Doom, MD .  amLODipine (NORVASC) tablet 10 mg, 10 mg, Oral, Daily, Rosalin Hawking, MD, 10 mg at 12/07/18 0920 .  aspirin EC tablet 81 mg, 81 mg, Oral, Daily, Rosalin Hawking, MD, 81 mg at 12/07/18 0920 .  donepezil (ARICEPT) tablet 10 mg, 10 mg, Oral, Daily, Greta Doom, MD, 10 mg at 12/07/18 0920 .  enoxaparin (LOVENOX) injection 40 mg, 40 mg, Subcutaneous, Q24H, Rosalin Hawking, MD, 40 mg at 12/07/18 1230 .  levothyroxine (SYNTHROID, LEVOTHROID) tablet 125 mcg, 125 mcg, Oral, Q0600, Greta Doom, MD, 125 mcg at 12/07/18 [phone] .  losartan (COZAAR) tablet 100 mg, 100 mg, Oral, Daily, Rosalin Hawking, MD, 100 mg at 12/07/18 0920 .  MEDLINE mouth rinse, 15 mL, Mouth Rinse, BID, Rush Farmer, MD, 15 mL at 12/07/18 9357 .  multivitamin with minerals tablet 1 tablet, 1 tablet, Oral, q morning - 10a, Rosalin Hawking, MD, 1 tablet at 12/07/18 0920 .  pantoprazole (PROTONIX) EC tablet 40 mg, 40 mg, Oral, QHS, Rosalin Hawking, MD, 40 mg at 12/06/18 2152 .  pravastatin (PRAVACHOL) tablet 40 mg, 40 mg, Oral, QHS, Greta Doom, MD, 40 mg at 12/06/18 2152 .  sodium chloride flush (NS) 0.9 % injection 3 mL, 3 mL, Intravenous, Q12H, Donne Hazel, MD, 3 mL at 12/07/18 0920  Patients Current Diet:   Diet Order            DIET - DYS 1 Room service appropriate? Yes; Fluid consistency: Thin  Diet effective now              Precautions / Restrictions Precautions Precautions: Fall Precaution Comments: at risk for skin breakdown   Has the patient had 2 or more falls or a fall with injury in the past year?Yes, 1 with injury in October where she broke toes due to fire alarm drill at ILF.   Prior  Activity Level Limited Community (1-2x/wk): Modified Independent with ambulation around ILF home and outside ILF grounds; mostly stayed in [place] facility. did ambulate outside room to go to dining hall 3x/day  Prior Functional Level Do you want Prior Function Level  of Independence: Needs assistance Gait / Transfers Assistance Needed: walks with a rollator her "buggy" ADL's / Homemaking Assistance Needed: assist from aids  Communication / Swallowing Assistance Needed: HOH from other? Self Care: Did the patient need help bathing, dressing, using the toilet or eating?  Needed some help for bathing and dressing (Min A); Independent with toileting and feeding.   Indoor Mobility: Did the patient need assistance with walking from room to room (with or without device)? Independent  Stairs: Did the patient need assistance with internal or external stairs (with or without device)? Needed some help  Functional Cognition: Did the patient need help planning regular tasks such as shopping or remembering to take medications? Needed some help  Home Assistive Devices / Equipment Home Equipment: Walker - 4 wheels  Prior Device Use: Indicate devices/aids used by the patient prior to current illness, exacerbation or injury? Walker  Prior Functional Level     Prior Functional Level Current Functional Level  Bed Mobility Independent Mod A x 2  Transfers Modified Independent  Mod Ax 2  Mobility - Walk/Wheelchair Modified Independent Mod A x2 pivotal steps  Mobility - Ambulation/Gait Modified Independent Mod A x2 pivotal steps  Upper Body Dressing Supervision Max A  Lower Body Dressing Min A Max A  Grooming Modified Independent Mod A in sitting  Eating/Drinking Modified Independent Mod A  Toilet Transfer Modified Independent Mod A x2  Bladder Continence Incontinent, uses incontinent products at baseline Incontinent; total A  Bowel Management  Incontinent, uses incontinent products at baseline  Incontinent; total A  Stair Climbing Min A/Mod A (per family description) NT  Communication Some aphasia  HOH, Expressive difficulties  Memory Unknown  Not formally assessed  Cooking/Meal Prep  Can do simple meal prep in home (cereal)      Housework  NA   Money Management  Unknown   Driving  Dependent     Special needs/care consideration BiPAP/CPAP: no CPM: no Continuous Drip IV: No Dialysis: no        Days: NA Life Vest: no Oxygen: no, RA Special Bed: no Trach Size: no Wound Vac (area): no      Location: no Skin: MASD to perineum                              Bowel mgmt: incontinent, last BM: 12/05/18 Bladder mgmt:incontinenet Diabetic mgmt: no  Previous Home Environment Living Arrangements: Alone Available Help at Discharge: Personal care attendant, Available 24 hours/day Type of Home: Independent living facility Home Layout: One level Home Access: Level entry Bathroom Shower/Tub: Multimedia programmer: Handicapped height Bathroom Accessibility: Yes How Accessible: Accessible via walker  Discharge Living Setting Plans for Discharge Living Setting: Patient's home, Apartment, Other (Comment)(ILF apartment at [facility]) Type of Home at Discharge: Max Name at Discharge: [place] with increased hired help  Discharge Home Layout: One level Discharge Home Access: Level entry Discharge Bathroom Shower/Tub: Other (comment)(tub shower cut out for slight ledge to enter) Discharge Bathroom Toilet: Handicapped height(with handles on elevated commode) Discharge Bathroom Accessibility: Yes How Accessible: Accessible via walker Does the patient have any problems obtaining your medications?: No  Social/Family/Support Systems Patient Roles: Other (Comment)(has supportive son and daughter in law) Contact Information: son: Juanda Crumble Charlotte Sanes): [phone]; Wells Guiles (daughter in law): [phone] Anticipated Caregiver: hired assist;  son and daughter willing to hire assist through [place]; already receives some services at baseline Anticipated Caregiver's Contact Information: see above  Ability/Limitations of Caregiver: Min A Caregiver Availability: 24/7 Discharge Plan Discussed with Primary Caregiver: Yes(son and daughter in law) Is Caregiver In Agreement with Plan?: Yes Does Caregiver/Family have Issues with Lodging/Transportation while Pt is in Rehab?: No  Goals/Additional Needs Patient/Family Goal for Rehab: PT: Supervision; OT: Min A; SLP: Supervision Expected length of stay: 12-15 days Cultural Considerations: [employer] Dietary Needs: DYS 1, thin liquids Equipment Needs: TBD Additional Information: has some baseline aphasia per family (But much worse now); has hx of dementia but was very funcitonal PTA Pt/Family Agrees to Admission and willing to participate: Yes Program Orientation Provided & Reviewed with Pt/Caregiver Including Roles  & Responsibilities: Yes(son and daugther in law)  Barriers to Discharge: Incontinence, Other (comments)  Barriers to Discharge Comments: will need 24/7 hired assist-to be determined closer to DC.   Patient Condition: I have reviewed medical records from [facility], spoken with RN, [place], and the patient, and her son and daughter in law. I met with patient at the bedside and gathered all necessary information required for an inpatient rehabilitation assessment.  Patient will benefit from ongoing PT, OT, and SLP, can actively participate in 3 hours of therapy a day 5 days of the week, and can make measurable gains during the admission.  Patient will also benefit from the coordinated team approach during an Inpatient Acute Rehabilitation admission.  The patient will receive intensive therapy as well as Rehabilitation physician, nursing, social worker, and care management interventions.  Due to bowel management, bladder management, safety, skin/wound care, disease  management, medical administration, pain management, patient education the patient requires 24 hour a day rehabilitation nursing.  The patient is currently Mod A x2 with mobility and Max A to mod A x2 for basic ADLs.  Discharge setting and therapy post discharge at ILF (home) with [facility] and 24/7 A is anticipated.  Patient has agreed to participate in the Acute Inpatient Rehabilitation Program and will admit today, 12/07/18.  Preadmission Screen Completed By:  Jhonnie Garner, 12/07/2018 2:31 PM ______________________________________________________________________   Discussed status with Dr. Naaman Plummer on 12/07/18 at 2:17PM and received telephone approval for admission today.  Admission Coordinator:  Jhonnie Garner, time 2:17PM/Date 12/07/18.   Assessment/Plan: Diagnosis: left ACA infarct 1. Does the need for close, 24 hr/day  Medical supervision in concert with the patient's rehab needs make it unreasonable for this patient to be served in a less intensive setting? Yes 2. Co-Morbidities requiring supervision/potential complications: CHF, CAD, dementia 3. Due to bladder management, bowel management, safety, skin/wound care, disease management, medication administration, pain management and patient education, does the patient require 24 hr/day rehab nursing? Yes 4. Does the patient require coordinated care of a physician, rehab nurse, PT (1-2 hrs/day, 5 days/week), OT (1-2 hrs/day, 5 days/week) and SLP (1-2 hrs/day, 5 days/week) to address physical and functional deficits in the context of the above medical diagnosis(es)? Yes Addressing deficits in the following areas: balance, endurance, locomotion, strength, transferring, bowel/bladder control, bathing, dressing, feeding, grooming, toileting, cognition, speech, language, swallowing and psychosocial support 5. Can the patient actively participate in an intensive therapy program of at least 3 hrs of therapy 5 days a week? Yes 6. The potential for patient to make  measurable gains while on inpatient rehab is excellent 7. Anticipated functional outcomes upon discharge from inpatients are: supervision PT, min assist OT, supervision SLP 8. Estimated rehab length of stay to reach the above functional goals is: 12-15 days 9. Anticipated D/C setting: Home 10. Anticipated post D/C treatments: HH therapy  11. Overall Rehab/Functional Prognosis: excellent    RECOMMENDATIONS: This patient's condition is appropriate for continued rehabilitative care in the following setting: CIR Patient has agreed to participate in recommended program. Yes Note that insurance prior authorization may be required for reimbursement for recommended care.  Comment: Meredith Staggers, MD, [facility] 12/07/2018   Jhonnie Garner 12/07/2018

## 2018-12-04 NOTE — H&P (Signed)
Physical Medicine and Rehabilitation Admission H&P   Chief Complaint  Patient presents with  . Stroke with functional decline.     HPI: [name] is a 85 year old  RH-female with history of stroke with mild aphasia, aneurysm clipping 1993, ocular stroke 2005, AV block s/p PPM, mild dementia who was admitted on 12/03/2018 after being found with right-sided weakness and aphasia.  CTA head neck showed emergent left A2 occlusion with poor collateralization and severe left and moderate right supraclinoid ICA stenosis, occluded left MCA at aneurysm clip with intermediate collaterals and 3.9 cm ascending aortic aneurysm..  She received IV tPA and Dr. Erlinda Hong felt that stroke embolic due to A. Fib. Follow up CT head showed evolving acute left frontal/ACA territory infarct with new petechial hemorrhage and old large L-MCA territory infarct. She was cleared to start ASA yesterday with recommendations to repeat CT in about a week and decide on DOAC based on CT findings.   2D echo done showing EF 55% with moderate increase left ventricular wall thickness, severely dilated left atrium moderate TVR, moderate calcification of mitral valve and aortic valve. She did have issues with mild respiratory distress due to fluid overload therefore IVF discontinued. ST exam revealed mixed dysfluent aphasia with slow speech and perseveratory motor responses She was placed on dysphagia 1, thins liquids. She is able to follow simple commands with increased time and working of pregait activity. CIR recommended due to functional decline.    Review of Systems  Unable to perform ROS: Language  Musculoskeletal: Positive for myalgias.       Pain "all over"      Past Medical History:  Diagnosis Date  . Aphasia 01/09/2009  . Arthritis    right hand  . 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 CEREBROVASCULAR ACCIDENT, HX OF 11/20/2007  . CHF (congestive heart failure) ([place])   . Coronary artery disease   . CVA 01/09/2009  .  DEMENTIA 05/19/2008  . Dementia ([place])   . DIVERTICULOSIS, COLON 11/20/2007  . FATIGUE 11/20/2007  . Femoral fracture ([place]) 07/01/2013  . GLUCOSE INTOLERANCE 12/06/2010  . HYPERLIPIDEMIA 11/20/2007  . HYPERTENSION 11/20/2007   Echo was done 07/07/11: mod LVH, EF 55-60%, grade 1 diast dysfxn, mild MR, mild LAE, mod TR, PASP 39  . HYPOTHYROIDISM 11/20/2007  . Impaired glucose tolerance 06/03/2011  . IRON DEFICIENCY 12/06/2010  . Pacemaker 2002  . PACEMAKER, PERMANENT 01/09/2009  . TIA 05/14/2003   elevated CE's in setting of TIA and falls in 10/12 - echo normal with no further cardiac w/u pursued  . TIA (transient ischemic attack) 10/10/2011  . Urinary incontinence 01/09/2009    Past Surgical History:  Procedure Laterality Date  . ABDOMINAL HYSTERECTOMY    . CHOLECYSTECTOMY    . CYSTOCELE REPAIR N/A 11/26/2012   Procedure: Cystocele Repair with Lefort Colpocleisis.  colpectomy Levatorplasty Perineorraphy;  Surgeon: Lovenia Kim, MD;  Location: [facility];  Service: Gynecology;  Laterality: N/A;  Cystocele Repair with Lefort Colpocleisis.  Marland Kitchen HIP ARTHROPLASTY Left 07/02/2013   Procedure: ARTHROPLASTY BIPOLAR HIP;  Surgeon: Mauri Pole, MD;  Location: [facility];  Service: Orthopedics;  Laterality: Left;  . INGUINAL HERNIA REPAIR Right 08/13/2013   Procedure: HERNIA REPAIR INGUINAL INCARCERATED;  Surgeon: Ralene Ok, MD;  Location: [facility];  Service: General;  Laterality: Right;  . Medtronic Cynthia dual chamber pacemaker serial number was [id number] H...04/15/2009    . s/p cerebral aneurysm  1991  . s/p pacemaker DDD    . s/p retinal  attachment    . s/p thyroidectomy      Family History  Problem Relation Age of Onset  . Cancer Mother        breast cancer    Social History: Widowed. Retired bookkeeper? Had personal care givers for ADLs but was independent for ambulation. Per  reports that she has never smoked. She has never used smokeless tobacco. She reports that she does not drink alcohol or use  drugs.    Allergies  Allergen Reactions  . Codeine     unknown    Medications Prior to Admission  Medication Sig Dispense Refill  . amLODipine (NORVASC) 10 MG tablet TAKE 1 TABLET BY MOUTH DAILY. RESUME IN 3 DAYS IS SYSTOLIC BLOOD PRESSURE IS GREATER THAN 150. (Patient taking differently: Take 10 mg by mouth daily. ) 90 tablet 0  . Calcium Carbonate-Vitamin D (CALTRATE 600+D) 600-400 MG-UNIT per tablet Take 1 tablet by mouth at bedtime.     . clopidogrel (PLAVIX) 75 MG tablet TAKE 1 TABLET(75 MG) BY MOUTH DAILY (Patient taking differently: Take 75 mg by mouth daily. ) 90 tablet 0  . Docusate Sodium (DSS) 100 MG CAPS Take 100 mg by mouth every morning.     . donepezil (ARICEPT) 10 MG tablet TAKE 1 TABLET(10 MG) BY MOUTH DAILY (Patient taking differently: Take 10 mg by mouth daily. ) 90 tablet 0  . feeding supplement, ENSURE COMPLETE, (ENSURE COMPLETE) LIQD Take 237 mLs by mouth 2 (two) times daily between meals.    Marland Kitchen levothyroxine (SYNTHROID, LEVOTHROID) 125 MCG tablet TAKE 1 TABLET(125 MCG) BY MOUTH DAILY (Patient taking differently: Take 125 mcg by mouth daily before breakfast. ) 90 tablet 0  . losartan (COZAAR) 100 MG tablet TAKE 1 TABLET(100 MG) BY MOUTH DAILY (Patient taking differently: Take 100 mg by mouth daily. ) 90 tablet 0  . Multiple Vitamin (MULTIVITAMIN WITH MINERALS) TABS Take 1 tablet by mouth every morning. Centrum silver    . polyethylene glycol (MIRALAX / GLYCOLAX) packet Take 17 g by mouth daily as needed for mild constipation.    . polyvinyl alcohol (ARTIFICIAL TEARS) 1.4 % ophthalmic solution Place 1 drop into both eyes 4 (four) times daily.     . pravastatin (PRAVACHOL) 40 MG tablet TAKE 1 TABLET(40 MG) BY MOUTH DAILY (Patient taking differently: Take 40 mg by mouth at bedtime. ) 90 tablet 0  . ALPRAZolam (XANAX) 0.25 MG tablet TAKE 1/2 -1 TABLET BY MOUTH 2 TIMES DAILY AS NEEDED (Patient not taking: Reported on 11/06/2018) 40 tablet 1    Drug Regimen Review  Drug  regimen was reviewed and remains appropriate with no significant issues identified  Home: Home Living Family/patient expects to be discharged to:: Private residence Living Arrangements: Alone Available Help at Discharge: Personal care attendant, Available 24 hours/day Type of Home: Independent living facility([facility]) Home Access: Level entry Home Layout: One level Bathroom Shower/Tub: Multimedia programmer: Handicapped height Home Equipment: Environmental consultant - 4 wheels   Functional History: Prior Function Level of Independence: Needs assistance Gait / Transfers Assistance Needed: walks with a rollator her "buggy" ADL's / [place]: assist from aids  Communication / [place] Needed: HOH  Functional Status:  Mobility: Bed Mobility Overal bed mobility: Needs Assistance Bed Mobility: Supine to Sit, Sit to Supine Supine to sit: Max assist, HOB elevated Sit to supine: Max assist, HOB elevated General bed mobility comments: Max assist to help initiate movement of legs to EOB and support trunk and hips to come to EOB.  HOB elevated to assist with supporting trunk during transitions.  Pt assisting a little with both transitions at her trunk and using her left arm/elbow.          ADL:    Cognition: Cognition Overall Cognitive Status: Impaired/Different from baseline Arousal/Alertness: Awake/alert Orientation Level: Oriented to person Attention: Focused Focused Attention: Appears intact Awareness: Impaired Cognition Arousal/Alertness: Awake/alert Behavior During Therapy: Flat affect Overall Cognitive Status: Impaired/Different from baseline Area of Impairment: Orientation, Attention, Memory, Following commands, Safety/judgement, Awareness, Problem solving Orientation Level: Time, Situation(knows her son and hospital) Current Attention Level: Focused Following Commands: Follows one step commands inconsistently, Follows one step commands with  increased time Safety/Judgement: Decreased awareness of deficits Awareness: Intellectual Problem Solving: Slow processing, Decreased initiation, Difficulty sequencing, Requires verbal cues, Requires tactile cues General Comments: Pt is very slow to process, needing 15-20 seconds of extra time, she is HOH also which doesn't increase her understanding of commands.  She inconsistantly will follow commands and usually more so on the left side, despite spontaneous movement of the right side.    Blood pressure (!) 161/51, pulse 60, temperature 98.3 F (36.8 C), temperature source Oral, resp. rate 18, height 5\' 5"  (1.651 m), weight 69.6 kg, SpO2 93 %. Physical Exam  Nursing note and vitals reviewed. Constitutional: She appears well-developed and well-nourished.  Frail appearing  HENT:  Head: Normocephalic and atraumatic.  Eyes: Pupils are equal, round, and reactive to light. EOM are normal.  Neck: Normal range of motion. No tracheal deviation present. No thyromegaly present.  Cardiovascular: Normal rate and regular rhythm. Exam reveals no friction rub.  No murmur heard. Respiratory: Effort normal. No respiratory distress. She has no wheezes.  GI: Soft. She exhibits no distension. There is no abdominal tenderness.  Neurological: She is alert.  Expressive greater than receptive aphasia. Followed most simple commands although usually with delay. Expressed verbally in one word, two word answers on occasion. Apraxic. RUE and RLE 4-/5. LUE and LLE 4 to 4+/5. Senses pain in all 4s. ?right field cut    Skin: Skin is warm.  Psychiatric:  Flat, cooperative    Results for orders placed or performed during the hospital encounter of 12/03/18 (from the past 48 hour(s))  CBG monitoring, ED     Status: Abnormal   Collection Time: 12/03/18  1:06 AM  Result Value Ref Range   Glucose-Capillary 134 (H) 70 - 99 mg/dL  I-stat Creatinine, ED     Status: Abnormal   Collection Time: 12/03/18  1:12 AM  Result  Value Ref Range   Creatinine, Ser 1.10 (H) 0.44 - 1.00 mg/dL  Protime-INR     Status: None   Collection Time: 12/03/18  1:18 AM  Result Value Ref Range   Prothrombin Time 13.7 11.4 - 15.2 seconds   INR 1.1 0.8 - 1.2    Comment: (NOTE) INR goal varies based on device and disease states. Performed at [facility], [place] [address] [place], [place] [address]   APTT     Status: None   Collection Time: 12/03/18  1:18 AM  Result Value Ref Range   aPTT 35 24 - 36 seconds    Comment: Performed at [facility] [address] [place] [address]  CBC     Status: None   Collection Time: 12/03/18  1:18 AM  Result Value Ref Range   WBC 5.8 4.0 - 10.5 K/uL   RBC 4.10 3.87 - 5.11 MIL/uL   Hemoglobin 12.9 12.0 -  15.0 g/dL   HCT 39.5 36.0 - 46.0 %   MCV 96.3 80.0 - 100.0 fL   MCH 31.5 26.0 - 34.0 pg   MCHC 32.7 30.0 - 36.0 g/dL   RDW 12.8 11.5 - 15.5 %   Platelets 224 150 - 400 K/uL   nRBC 0.0 0.0 - 0.2 %    Comment: Performed at [facility], [place] [address], [place], [place] [address]  Differential     Status: None   Collection Time: 12/03/18  1:18 AM  Result Value Ref Range   Neutrophils Relative % 72 %   Neutro Abs 4.3 1.7 - 7.7 K/uL   Lymphocytes Relative 13 %   Lymphs Abs 0.8 0.7 - 4.0 K/uL   Monocytes Relative 13 %   Monocytes Absolute 0.7 0.1 - 1.0 K/uL   Eosinophils Relative 1 %   Eosinophils Absolute 0.0 0.0 - 0.5 K/uL   Basophils Relative 0 %   Basophils Absolute 0.0 0.0 - 0.1 K/uL   Immature Granulocytes 1 %   Abs Immature Granulocytes 0.03 0.00 - 0.07 K/uL    Comment: Performed at [facility] [address] [place], [place] [address]  Comprehensive metabolic panel     Status: Abnormal   Collection Time: 12/03/18  1:18 AM  Result Value Ref Range   Sodium 136 135 - 145 mmol/L   Potassium 3.1 (L) 3.5 - 5.1 mmol/L   Chloride 101 98 - 111 mmol/L   CO2 23 22 - 32 mmol/L   Glucose, Bld 151 (H) 70 - 99 mg/dL   BUN 35 (H) 8 - 23 mg/dL    Creatinine, Ser 1.12 (H) 0.44 - 1.00 mg/dL   Calcium 8.3 (L) 8.9 - 10.3 mg/dL   Total Protein 6.4 (L) 6.5 - 8.1 g/dL   Albumin 3.5 3.5 - 5.0 g/dL   AST 24 15 - 41 U/L   ALT 21 0 - 44 U/L   Alkaline Phosphatase 58 38 - 126 U/L   Total Bilirubin 0.7 0.3 - 1.2 mg/dL   GFR calc non Af Amer 42 (L) >60 mL/min   GFR calc Af Amer 49 (L) >60 mL/min   Anion gap 12 5 - 15    Comment: Performed at [facility], [place] [address], [place], [place] [address]  Ethanol     Status: None   Collection Time: 12/03/18  1:19 AM  Result Value Ref Range   Alcohol, Ethyl (B) <10 <10 mg/dL    Comment: (NOTE) Lowest detectable limit for serum alcohol is 10 mg/dL. For medical purposes only. Performed at [facility], [place] [address], [place], [place] [address]   CBC     Status: None   Collection Time: 12/03/18  3:20 AM  Result Value Ref Range   WBC 6.0 4.0 - 10.5 K/uL   RBC 3.96 3.87 - 5.11 MIL/uL   Hemoglobin 12.4 12.0 - 15.0 g/dL   HCT 38.0 36.0 - 46.0 %   MCV 96.0 80.0 - 100.0 fL   MCH 31.3 26.0 - 34.0 pg   MCHC 32.6 30.0 - 36.0 g/dL   RDW 12.8 11.5 - 15.5 %   Platelets 220 150 - 400 K/uL   nRBC 0.0 0.0 - 0.2 %    Comment: Performed at [facility], [place] [address], [place],  [address]  Hemoglobin A1c     Status: None   Collection Time: 12/03/18  3:20 AM  Result Value Ref Range  Hgb A1c MFr Bld 5.6 4.8 - 5.6 %    Comment: (NOTE) Pre diabetes:          5.7%-6.4% Diabetes:              >6.4% Glycemic control for   <7.0% adults with diabetes    Mean Plasma Glucose 114.02 mg/dL    Comment: Performed at [facility] [address] [place], [place] [address]  Lipid panel     Status: Abnormal   Collection Time: 12/03/18  3:20 AM  Result Value Ref Range   Cholesterol 101 0 - 200 mg/dL   Triglycerides 66 <150 mg/dL   HDL 40 (L) >40 mg/dL   Total CHOL/HDL Ratio 2.5 RATIO   VLDL 13 0 - 40 mg/dL   LDL Cholesterol 48 0 - 99 mg/dL    Comment:        Total  Cholesterol/HDL:CHD Risk Coronary Heart Disease Risk Table                     Men   Women  1/2 Average Risk   3.4   3.3  Average Risk       5.0   4.4  2 X Average Risk   9.6   7.1  3 X Average Risk  23.4   11.0        Use the calculated Patient Ratio above and the CHD Risk Table to determine the patient's CHD Risk.        ATP III CLASSIFICATION (LDL):  <100     mg/dL   Optimal  100-129  mg/dL   Near or Above                    Optimal  130-159  mg/dL   Borderline  160-189  mg/dL   High  >190     mg/dL   Very High Performed at [facility] [address], [place], [place] [address]   MRSA PCR Screening     Status: None   Collection Time: 12/03/18  4:00 AM  Result Value Ref Range   MRSA by PCR NEGATIVE NEGATIVE    Comment:        The GeneXpert MRSA Assay (FDA approved for NASAL specimens only), is one component of a comprehensive MRSA colonization surveillance program. It is not intended to diagnose MRSA infection nor to guide or monitor treatment for MRSA infections. Performed at [facility], [place] [address] [place], [place] [address]   Basic metabolic panel     Status: Abnormal   Collection Time: 12/04/18  3:53 AM  Result Value Ref Range   Sodium 135 135 - 145 mmol/L   Potassium 3.6 3.5 - 5.1 mmol/L   Chloride 107 98 - 111 mmol/L   CO2 19 (L) 22 - 32 mmol/L   Glucose, Bld 116 (H) 70 - 99 mg/dL   BUN 19 8 - 23 mg/dL   Creatinine, Ser 0.89 0.44 - 1.00 mg/dL   Calcium 8.0 (L) 8.9 - 10.3 mg/dL   GFR calc non Af Amer 55 (L) >60 mL/min   GFR calc Af Amer >60 >60 mL/min   Anion gap 9 5 - 15    Comment: Performed at [facility] [address], [place],  [address]  CBC     Status: None   Collection Time: 12/04/18  3:53 AM  Result Value Ref Range   WBC 7.5 4.0 - 10.5  K/uL   RBC 4.00 3.87 - 5.11 MIL/uL   Hemoglobin 12.9 12.0 - 15.0 g/dL   HCT 37.9 36.0 - 46.0 %   MCV 94.8 80.0 - 100.0 fL   MCH 32.3 26.0 - 34.0 pg   MCHC 34.0 30.0 - 36.0  g/dL   RDW 12.8 11.5 - 15.5 %   Platelets 226 150 - 400 K/uL   nRBC 0.0 0.0 - 0.2 %    Comment: Performed at [facility], [place] [address], [place], [place] [address]  Glucose, capillary     Status: Abnormal   Collection Time: 12/04/18  3:53 AM  Result Value Ref Range   Glucose-Capillary 113 (H) 70 - 99 mg/dL  Glucose, capillary     Status: Abnormal   Collection Time: 12/04/18  7:44 AM  Result Value Ref Range   Glucose-Capillary 105 (H) 70 - 99 mg/dL  Glucose, capillary     Status: Abnormal   Collection Time: 12/04/18 12:33 PM  Result Value Ref Range   Glucose-Capillary 130 (H) 70 - 99 mg/dL   Ct Angio Head W Or Wo Contrast  Result Date: 12/03/2018 CLINICAL DATA:  Facial droop.  Nonverbal. EXAM: CT ANGIOGRAPHY HEAD AND NECK CT PERFUSION BRAIN TECHNIQUE: Multidetector CT imaging of the head and neck was performed using the standard protocol during bolus administration of intravenous contrast. Multiplanar CT image reconstructions and MIPs were obtained to evaluate the vascular anatomy. Carotid stenosis measurements (when applicable) are obtained utilizing NASCET criteria, using the distal internal carotid diameter as the denominator. Multiphase CT imaging of the brain was performed following IV bolus contrast injection. Subsequent parametric perfusion maps were calculated using RAPID software. CONTRAST:  180mL OMNIPAQUE IOHEXOL 350 MG/ML SOLN, 21mL ISOVUE-370 IOPAMIDOL (ISOVUE-370) INJECTION 76%; 128mL OMNIPAQUE IOHEXOL 350 MG/ML SOLN COMPARISON:  CT HEAD December 03, 2018 FINDINGS: CTA NECK FINDINGS: AORTIC ARCH: 3.9 cm ascending aorta. Moderate calcific atherosclerosis. The origins of the innominate, left Common carotid artery and subclavian artery are patent. RIGHT CAROTID SYSTEM: Common carotid artery is patent, trace calcific atherosclerosis. Moderate calcific atherosclerosis of the carotid bifurcation without hemodynamically significant stenosis by NASCET criteria. Normal appearance of the  internal carotid artery. LEFT CAROTID SYSTEM: Common carotid artery is patent. Mild calcific atherosclerosis of the carotid bifurcation without hemodynamically significant stenosis by NASCET criteria. Normal appearance of the internal carotid artery. VERTEBRAL ARTERIES:Left vertebral artery is dominant. Normal appearance of the vertebral arteries, widely patent. SKELETON: No acute osseous process though bone windows have not been submitted. OTHER NECK: Soft tissues of the neck are nonacute though, not tailored for evaluation. UPPER CHEST: Mosaic lung attenuation seen with small airway disease and/or pulmonary edema. Narrowed tracheobronchial tree, possible malacia. Streak artifact from LEFT cardiac pacemaker battery pack. CTA HEAD FINDINGS: ANTERIOR CIRCULATION: Patent cervical internal carotid arteries, petrous, cavernous and supra clinoid internal carotid arteries. Severe LEFT and moderate RIGHT stenosis supraclinoid ICA's. Patent anterior communicating artery. Gradual loss of LEFT A2 contrast opacification, poor collateralization. Patent RIGHT ACA. Irregular LEFT MCA, occluded at aneurysm clip with intermediate collaterals. Patent RIGHT middle cerebral artery, severe stenosis RIGHT M2 segments. No contrast extravasation or aneurysm. POSTERIOR CIRCULATION: Patent vertebral arteries, vertebrobasilar junction and basilar artery, as well as main branch vessels. Patent posterior cerebral arteries, moderate luminal irregularity compatible with atherosclerosis. Small LEFT posterior communicating artery. No large vessel occlusion, flow-limiting stenosis, contrast extravasation or aneurysm. VENOUS SINUSES: Major dural venous sinuses are patent though not tailored for evaluation on this angiographic examination. ANATOMIC VARIANTS: None. DELAYED PHASE: Not  performed. MIP images reviewed. CT Brain Perfusion Findings: Rapid data processing failed. IMPRESSION: CTA NECK: 1. No hemodynamically significant stenosis ICA's. 2.  Patent vertebral arteries. 3. **An incidental finding of potential clinical significance has been found. 3.9 cm ascending aorta. Recommend annual imaging followup by CTA or MRA. This recommendation follows 2010 ACCF/AHA/AATS/ACR/ASA/SCA/SCAI/SIR/STS/SVM Guidelines for the Diagnosis and Management of Patients with Thoracic Aortic Disease. Circulation.2010; 121: W546-E703. Aortic aneurysm NOS (ICD10-I71.9)** CTA HEAD: 1. Emergent LEFT A2 occlusion, poor collateralization by single-phase CTA. 2. Severe LEFT and moderate RIGHT supraclinoid ICA stenosis. 3. Occluded LEFT MCA at aneurysm clip, intermediate collaterals by single-phase CTA. CT PERFUSION: 1. Rapid data processing failed. Critical Value/emergent results text paged to Dr.MCNEILL [facility] via AMION secure system on 12/03/2018 at 1:35 am, including interpreting physician's phone number. Perfusion failure discussed with and reconfirmed by Dr.MCNEILL [facility] on 12/03/2018 at 2:20 am. Electronically Signed   By: Elon Alas M.D.   On: 12/03/2018 02:24   Ct Head Wo Contrast  Result Date: 12/04/2018 CLINICAL DATA:  Follow up stroke. History of stroke, cerebral aneurysm, dementia. EXAM: CT HEAD WITHOUT CONTRAST TECHNIQUE: Contiguous axial images were obtained from the base of the skull through the vertex without intravenous contrast. COMPARISON:  None. FINDINGS: BRAIN: Increasing conspicuity of LEFT mesial frontal loss of gray-white matter differentiation, with petechial hemorrhage. Regional mass effect without midline shift. LEFT frontotemporal parietal encephalomalacia, ex vacuo dilatation LEFT lateral ventricle. No hydrocephalus. Old basal ganglia, thalami and cerebellar infarcts. Confluent supratentorial white matter hypodensities. No abnormal extra-axial fluid collections. Basal cisterns are patent. VASCULAR: Moderate to severe calcific atherosclerosis of the carotid siphons. LEFT MCA bifurcation aneurysm clipping. SKULL: No skull fracture. Severe  temporomandibular osteoarthrosis. Old LEFT craniotomy. No significant scalp soft tissue swelling. SINUSES/ORBITS: Trace paranasal sinus mucosal thickening. Mastoid air cells are well aerated.The included ocular globes and orbital contents are non-suspicious. Status post bilateral ocular lens implants. OTHER: None. IMPRESSION: 1. Evolving acute LEFT frontal/ACA territory infarct with new petechial hemorrhage. 2. Old large LEFT MCA territory infarct, status post clipping of LEFT MCA aneurysm. 3. Moderate to severe chronic small vessel ischemic changes and multiple old supra and infratentorial infarcts. Electronically Signed   By: Elon Alas M.D.   On: 12/04/2018 00:32   Ct Angio Neck W Or Wo Contrast  Result Date: 12/03/2018 CLINICAL DATA:  Facial droop.  Nonverbal. EXAM: CT ANGIOGRAPHY HEAD AND NECK CT PERFUSION BRAIN TECHNIQUE: Multidetector CT imaging of the head and neck was performed using the standard protocol during bolus administration of intravenous contrast. Multiplanar CT image reconstructions and MIPs were obtained to evaluate the vascular anatomy. Carotid stenosis measurements (when applicable) are obtained utilizing NASCET criteria, using the distal internal carotid diameter as the denominator. Multiphase CT imaging of the brain was performed following IV bolus contrast injection. Subsequent parametric perfusion maps were calculated using RAPID software. CONTRAST:  158mL OMNIPAQUE IOHEXOL 350 MG/ML SOLN, 62mL ISOVUE-370 IOPAMIDOL (ISOVUE-370) INJECTION 76%; 160mL OMNIPAQUE IOHEXOL 350 MG/ML SOLN COMPARISON:  CT HEAD December 03, 2018 FINDINGS: CTA NECK FINDINGS: AORTIC ARCH: 3.9 cm ascending aorta. Moderate calcific atherosclerosis. The origins of the innominate, left Common carotid artery and subclavian artery are patent. RIGHT CAROTID SYSTEM: Common carotid artery is patent, trace calcific atherosclerosis. Moderate calcific atherosclerosis of the carotid bifurcation without hemodynamically  significant stenosis by NASCET criteria. Normal appearance of the internal carotid artery. LEFT CAROTID SYSTEM: Common carotid artery is patent. Mild calcific atherosclerosis of the carotid bifurcation without hemodynamically significant stenosis by NASCET criteria. Normal appearance of the internal  carotid artery. VERTEBRAL ARTERIES:Left vertebral artery is dominant. Normal appearance of the vertebral arteries, widely patent. SKELETON: No acute osseous process though bone windows have not been submitted. OTHER NECK: Soft tissues of the neck are nonacute though, not tailored for evaluation. UPPER CHEST: Mosaic lung attenuation seen with small airway disease and/or pulmonary edema. Narrowed tracheobronchial tree, possible malacia. Streak artifact from LEFT cardiac pacemaker battery pack. CTA HEAD FINDINGS: ANTERIOR CIRCULATION: Patent cervical internal carotid arteries, petrous, cavernous and supra clinoid internal carotid arteries. Severe LEFT and moderate RIGHT stenosis supraclinoid ICA's. Patent anterior communicating artery. Gradual loss of LEFT A2 contrast opacification, poor collateralization. Patent RIGHT ACA. Irregular LEFT MCA, occluded at aneurysm clip with intermediate collaterals. Patent RIGHT middle cerebral artery, severe stenosis RIGHT M2 segments. No contrast extravasation or aneurysm. POSTERIOR CIRCULATION: Patent vertebral arteries, vertebrobasilar junction and basilar artery, as well as main branch vessels. Patent posterior cerebral arteries, moderate luminal irregularity compatible with atherosclerosis. Small LEFT posterior communicating artery. No large vessel occlusion, flow-limiting stenosis, contrast extravasation or aneurysm. VENOUS SINUSES: Major dural venous sinuses are patent though not tailored for evaluation on this angiographic examination. ANATOMIC VARIANTS: None. DELAYED PHASE: Not performed. MIP images reviewed. CT Brain Perfusion Findings: Rapid data processing failed. IMPRESSION:  CTA NECK: 1. No hemodynamically significant stenosis ICA's. 2. Patent vertebral arteries. 3. **An incidental finding of potential clinical significance has been found. 3.9 cm ascending aorta. Recommend annual imaging followup by CTA or MRA. This recommendation follows 2010 ACCF/AHA/AATS/ACR/ASA/SCA/SCAI/SIR/STS/SVM Guidelines for the Diagnosis and Management of Patients with Thoracic Aortic Disease. Circulation.2010; 121: F621-H086. Aortic aneurysm NOS (ICD10-I71.9)** CTA HEAD: 1. Emergent LEFT A2 occlusion, poor collateralization by single-phase CTA. 2. Severe LEFT and moderate RIGHT supraclinoid ICA stenosis. 3. Occluded LEFT MCA at aneurysm clip, intermediate collaterals by single-phase CTA. CT PERFUSION: 1. Rapid data processing failed. Critical Value/emergent results text paged to Dr.MCNEILL [facility] via AMION secure system on 12/03/2018 at 1:35 am, including interpreting physician's phone number. Perfusion failure discussed with and reconfirmed by Dr.MCNEILL [facility] on 12/03/2018 at 2:20 am. Electronically Signed   By: Elon Alas M.D.   On: 12/03/2018 02:24   Ct Cerebral Perfusion W Contrast  Result Date: 12/03/2018 CLINICAL DATA:  Facial droop.  Nonverbal. EXAM: CT ANGIOGRAPHY HEAD AND NECK CT PERFUSION BRAIN TECHNIQUE: Multidetector CT imaging of the head and neck was performed using the standard protocol during bolus administration of intravenous contrast. Multiplanar CT image reconstructions and MIPs were obtained to evaluate the vascular anatomy. Carotid stenosis measurements (when applicable) are obtained utilizing NASCET criteria, using the distal internal carotid diameter as the denominator. Multiphase CT imaging of the brain was performed following IV bolus contrast injection. Subsequent parametric perfusion maps were calculated using RAPID software. CONTRAST:  162mL OMNIPAQUE IOHEXOL 350 MG/ML SOLN, 53mL ISOVUE-370 IOPAMIDOL (ISOVUE-370) INJECTION 76%; 166mL OMNIPAQUE IOHEXOL 350 MG/ML  SOLN COMPARISON:  CT HEAD December 03, 2018 FINDINGS: CTA NECK FINDINGS: AORTIC ARCH: 3.9 cm ascending aorta. Moderate calcific atherosclerosis. The origins of the innominate, left Common carotid artery and subclavian artery are patent. RIGHT CAROTID SYSTEM: Common carotid artery is patent, trace calcific atherosclerosis. Moderate calcific atherosclerosis of the carotid bifurcation without hemodynamically significant stenosis by NASCET criteria. Normal appearance of the internal carotid artery. LEFT CAROTID SYSTEM: Common carotid artery is patent. Mild calcific atherosclerosis of the carotid bifurcation without hemodynamically significant stenosis by NASCET criteria. Normal appearance of the internal carotid artery. VERTEBRAL ARTERIES:Left vertebral artery is dominant. Normal appearance of the vertebral arteries, widely patent. SKELETON: No acute osseous process though  bone windows have not been submitted. OTHER NECK: Soft tissues of the neck are nonacute though, not tailored for evaluation. UPPER CHEST: Mosaic lung attenuation seen with small airway disease and/or pulmonary edema. Narrowed tracheobronchial tree, possible malacia. Streak artifact from LEFT cardiac pacemaker battery pack. CTA HEAD FINDINGS: ANTERIOR CIRCULATION: Patent cervical internal carotid arteries, petrous, cavernous and supra clinoid internal carotid arteries. Severe LEFT and moderate RIGHT stenosis supraclinoid ICA's. Patent anterior communicating artery. Gradual loss of LEFT A2 contrast opacification, poor collateralization. Patent RIGHT ACA. Irregular LEFT MCA, occluded at aneurysm clip with intermediate collaterals. Patent RIGHT middle cerebral artery, severe stenosis RIGHT M2 segments. No contrast extravasation or aneurysm. POSTERIOR CIRCULATION: Patent vertebral arteries, vertebrobasilar junction and basilar artery, as well as main branch vessels. Patent posterior cerebral arteries, moderate luminal irregularity compatible with  atherosclerosis. Small LEFT posterior communicating artery. No large vessel occlusion, flow-limiting stenosis, contrast extravasation or aneurysm. VENOUS SINUSES: Major dural venous sinuses are patent though not tailored for evaluation on this angiographic examination. ANATOMIC VARIANTS: None. DELAYED PHASE: Not performed. MIP images reviewed. CT Brain Perfusion Findings: Rapid data processing failed. IMPRESSION: CTA NECK: 1. No hemodynamically significant stenosis ICA's. 2. Patent vertebral arteries. 3. **An incidental finding of potential clinical significance has been found. 3.9 cm ascending aorta. Recommend annual imaging followup by CTA or MRA. This recommendation follows 2010 ACCF/AHA/AATS/ACR/ASA/SCA/SCAI/SIR/STS/SVM Guidelines for the Diagnosis and Management of Patients with Thoracic Aortic Disease. Circulation.2010; 121: O756-E332. Aortic aneurysm NOS (ICD10-I71.9)** CTA HEAD: 1. Emergent LEFT A2 occlusion, poor collateralization by single-phase CTA. 2. Severe LEFT and moderate RIGHT supraclinoid ICA stenosis. 3. Occluded LEFT MCA at aneurysm clip, intermediate collaterals by single-phase CTA. CT PERFUSION: 1. Rapid data processing failed. Critical Value/emergent results text paged to Dr.MCNEILL [facility] via AMION secure system on 12/03/2018 at 1:35 am, including interpreting physician's phone number. Perfusion failure discussed with and reconfirmed by Dr.MCNEILL [facility] on 12/03/2018 at 2:20 am. Electronically Signed   By: Elon Alas M.D.   On: 12/03/2018 02:24   Dg Chest Port 1 View  Result Date: 12/04/2018 CLINICAL DATA:  Congestive heart failure. EXAM: PORTABLE CHEST 1 VIEW COMPARISON:  10/11/2018 FINDINGS: There is new atelectasis or consolidation at the left lung base. Chronic mild cardiomegaly. Pacemaker in place. Aortic atherosclerosis. Pulmonary vascularity is within normal limits. Markings are accentuated at the right lung base due to a shallow inspiration. No significant bone  abnormality. IMPRESSION: New area of atelectasis or consolidation at the left lung base. Aortic Atherosclerosis (ICD10-I70.0). Electronically Signed   By: Lorriane Shire M.D.   On: 12/04/2018 12:18   Ct Head Code Stroke Wo Contrast  Result Date: 12/03/2018 CLINICAL DATA:  Code stroke.  Facial droop.  Nonverbal. EXAM: CT HEAD WITHOUT CONTRAST TECHNIQUE: Contiguous axial images were obtained from the base of the skull through the vertex without intravenous contrast. COMPARISON:  CT HEAD January 11, 2015 FINDINGS: BRAIN: Mesial LEFT frontal blurring of the gray-white matter junction. No intraparenchymal hemorrhage, mass effect nor midline shift. LEFT frontotemporoparietal cystic encephalomalacia. Old basal ganglia and bilateral thalami small infarcts. Old LEFT > RIGHT cerebellar infarcts. Confluent supratentorial white matter hypodensities. Ex vacuo dilatation LEFT lateral ventricle. No hydrocephalus. Scattered punctate parenchymal versus extra-axial calcifications. No abnormal extra-axial fluid collections. Basal cisterns are patent. VASCULAR: Moderate to severe calcific atherosclerosis of the carotid siphons. Status post clipping of LEFT MCA bifurcation aneurysm. SKULL: No skull fracture. Severe bilateral temporomandibular osteoarthrosis. Old LEFT frontal craniotomy. No significant scalp soft tissue swelling. SINUSES/ORBITS: Trace paranasal sinus mucosal thickening. Mastoid air cells  are well aerated.The included ocular globes and orbital contents are non-suspicious. Status post bilateral ocular lens implants. OTHER: None. ASPECTS [facility] Stroke Program Early CT Score) - Ganglionic level infarction (caudate, lentiform nuclei, internal capsule, insula, M1-M3 cortex): 7 - Supraganglionic infarction (M4-M6 cortex): 3 Total score (0-10 with 10 being normal): 10 IMPRESSION: 1. Acute nonhemorrhagic LEFT frontal/ACA territory infarct. 2. Old large LEFT MCA territory infarct, status post clipping of LEFT MCA aneurysm. 3.  Moderate to severe chronic small vessel ischemic changes. Old small basal ganglia, thalami and cerebellar infarcts. 4. ASPECTS is 10. 5. Critical Value/emergent results were called by telephone at the time of interpretation on 12/03/2018 at 1:22 am to Dr. Leonel Ramsay, Neurology, who verbally acknowledged these results. Electronically Signed   By: Elon Alas M.D.   On: 12/03/2018 01:23       Medical Problem List and Plan: 1.  Functional decline secondary to Left frontal/ACA infarct.   2.  Antithrombotics: -DVT/anticoagulation:  Pharmaceutical: Lovenox  -antiplatelet therapy: Low dose ASA 3. Pain Management: Tylenol prn 4. Mood: LCSW to follow for evaluation and support.   -antipsychotic agents: N/A 5. Neuropsych: This patient is not fully capable of making decisions on on own behalf. 6. Skin/Wound Care: routine pressure relief measures.  7. Fluids/Electrolytes/Nutrition: Monitor I/O. Check lytes in am.  8. HTN: Monitor BP bid. Continue Amlodipine and Cozaar daily 9. Dyslipidemia: On pravastatin.  10. 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        Bary Leriche, PA-C 12/04/2018

## 2018-12-04 NOTE — Progress Notes (Signed)
Pt transported to Xray for Barium swallow test per bed - accompanied by SWOT RN & transport personnel - & monitored portable 02 at 2 L n/c.

## 2018-12-04 NOTE — Telephone Encounter (Signed)
Hi, Dr. Lovena Le:  Thank you so much for reaching back. Sorry to bother on your days off. She had left ACA stroke this time and I do think it is from her afib. She had cerebral aneurysm s/p clipping in 1993 otherwise seems not too much bleeding history. She has some hemorrhagic conversion after tPA this time so I will not start [facility] or antiplatelet now but will consider her antithrombotic regimen. Will keep you posted. Thanks again.   Jantz Main

## 2018-12-04 NOTE — Progress Notes (Signed)
Modified Barium Swallow Progress Note  Patient Details  Name: [name] MRN: [id number] Date of Birth: December 01, 1921  Today's Date: 12/04/2018  Modified Barium Swallow completed.  Full report located under Chart Review in the Imaging Section.  Brief recommendations include the following:  Clinical Impression  Pt presented with functional oropharyngeal swallow with no aspiration.  She demonstrated adequate oral control of purees and thin liquids.  She removed the barium paste from the cracker, but did not take the cracker into her mouth and did not follow commands to chew the cracker, so regular solids were not assessed.  In the same way, pt did not understand/follow commands in order to assess a 13 mm barium pill - this was removed from her mouth.  Pharyngeal swallow initiated at the pyriforms with thin liquids;  valleculae with purees.  There was intermittent, high penetration of thin liquids into the laryngeal vestibule; penetrate was ejected from larynx upon completion of the swallow (Penetration/Aspiration Score of #2, considered normal).  There was sufficient laryngeal vestibule closure and no aspiration.  Concern for aspiration is related to tachypnea and interference with timing of airway closure. Recommend beginning a dysphagia 1 diet with thin liquids; crush meds in puree. Provide full supervision/assist with self-feeding. Hold tray if coughing is problematic during meals.  D/W RN.    Swallow Evaluation Recommendations       SLP Diet Recommendations: Dysphagia 1 (Puree) solids;Thin liquid   Liquid Administration via: Cup;Straw   Medication Administration: Crushed with puree   Supervision: Staff to assist with self feeding   Compensations: Minimize environmental distractions   Postural Changes: Remain semi-upright after after feeds/meals (Comment)   Oral Care Recommendations: Oral care BID        [name] 12/04/2018,4:49 PM

## 2018-12-04 NOTE — Progress Notes (Signed)
Inpatient Rehabilitation-Admissions Coordinator    Met with patient and her son and daughter in law at the bedside to discuss team's recommendation for inpatient rehabilitation. Shared booklets, expectations while in CIR, expected length of stay, and anticipated functional level at DC. Family would like to pursue CIR at this time.   AC will follow for tolerance and medical readiness.   Please call if questions.   Jhonnie Garner, OTR/L  Rehab Admissions Coordinator  [phone] 12/04/2018 10:37 AM

## 2018-12-04 NOTE — Progress Notes (Signed)
OT Cancellation Note  Patient Details Name: [name] MRN: [id number] DOB: 02/05/1922   Cancelled Treatment:    Reason Eval/Treat Not Completed: Medical issues which prohibited therapy. Per nsg change in status. Will assess when medically appropriate.   Ramond Dial, OT/L   Acute OT Clinical Specialist Acute Rehabilitation Services Pager [phone] Office [phone]  12/04/2018, 3:42 PM

## 2018-12-04 NOTE — Progress Notes (Addendum)
STROKE TEAM PROGRESS NOTE   SUBJECTIVE (INTERVAL HISTORY)  No family at bedside, patient asleep in bed, NAD.    OBJECTIVE Vitals:   12/04/18 0500 12/04/18 0600 12/04/18 0700 12/04/18 0800  BP: (!) 168/57 (!) 153/74 (!) 156/52 (!) 168/69  Pulse: 63 65 60 77  Resp: (!) 23 (!) 42 20 (!) 21  Temp:   98.1 F (36.7 C)   TempSrc:   Axillary   SpO2: 93% 95% 96% 92%  Weight:      Height:        CBC:  Recent Labs  Lab 12/03/18 0118 12/03/18 0320 12/04/18 0353  WBC 5.8 6.0 7.5  NEUTROABS 4.3  --   --   HGB 12.9 12.4 12.9  HCT 39.5 38.0 37.9  MCV 96.3 96.0 94.8  PLT 224 220 229    Basic Metabolic Panel:  Recent Labs  Lab 12/03/18 0118 12/04/18 0353  NA 136 135  K 3.1* 3.6  CL 101 107  CO2 23 19*  GLUCOSE 151* 116*  BUN 35* 19  CREATININE 1.12* 0.89  CALCIUM 8.3* 8.0*    Lipid Panel:     Component Value Date/Time   CHOL 101 12/03/2018 0320   TRIG 66 12/03/2018 0320   HDL 40 (L) 12/03/2018 0320   CHOLHDL 2.5 12/03/2018 0320   VLDL 13 12/03/2018 0320   LDLCALC 48 12/03/2018 0320   HgbA1c:  Lab Results  Component Value Date   HGBA1C 5.6 12/03/2018   Urine Drug Screen: No results found for: LABOPIA, COCAINSCRNUR, LABBENZ, AMPHETMU, THCU, LABBARB  Alcohol Level     Component Value Date/Time   ETH <10 12/03/2018 0119    IMAGING  Ct Angio Head W Or Wo Contrast Ct Angio Neck W Or Wo Contrast Ct Cerebral Perfusion W Contrast 12/03/2018 IMPRESSION:   CTA NECK:  1. No hemodynamically significant stenosis ICA's.  2. Patent vertebral arteries.  3. An incidental finding of potential clinical significance has been found. 3.9 cm ascending aorta. Recommend annual imaging followup by CTA or MRA. This recommendation follows 2010 ACCF/AHA/AATS/ACR/ASA/SCA/SCAI/SIR/STS/SVM Guidelines for the Diagnosis and Management of Patients with Thoracic Aortic Disease. Circulation.2010; 121: N989-Q119. Aortic aneurysm NOS (ICD10-I71.9)**   CTA HEAD:  1. Emergent LEFT A2  occlusion, poor collateralization by single-phase CTA.  2. Severe LEFT and moderate RIGHT supraclinoid ICA stenosis.  3. Occluded LEFT MCA at aneurysm clip, intermediate collaterals by single-phase   CTA. CT PERFUSION:  1. Rapid data processing failed.    Ct Head Code Stroke Wo Contrast 12/03/2018  IMPRESSION:  1. Acute nonhemorrhagic LEFT frontal/ACA territory infarct.  2. Old large LEFT MCA territory infarct, status post clipping of LEFT MCA aneurysm.  3. Moderate to severe chronic small vessel ischemic changes. Old small basal ganglia, thalami and cerebellar infarcts.  4. ASPECTS is 10.   Transthoracic Echocardiogram -   1. The left ventricle has a visually estimated ejection fraction of of 55%. The cavity size was normal. There is moderately increased left ventricular wall thickness. Left ventricular diastolic Doppler parameters are indeterminate. No evidence of left   ventricular regional wall motion abnormalities. Septal-lateral  dyssynchrony consistent with RV pacing.   2. The right ventricle has normal systolic function. The cavity was  normal. There is no increase in right ventricular wall thickness.   3. Left atrial size was severely dilated.   4. Right atrial size was mildly dilated.   5. The mitral valve is degenerative. Moderate calcification of the  mitral valve leaflet. There is moderate to  severe mitral annular  calcification present. Mild mitral regurgitation. There is no more than  mild mitral stenosis present, mean gradient 4   mmHg.   6. Tricuspid valve regurgitation is moderate.   7. The aortic valve is tricuspid Moderate calcification of the aortic  valve. Aortic valve regurgitation is moderate by color flow Doppler. no  stenosis of the aortic valve.   8. The ascending aorta is normal in size and structure.   9. There is mild dilatation of the aortic root measuring 37 mm.  10. The inferior vena cava was dilated in size with <50% respiratory  variability. PA systolic  pressure 45 mmHg.  11. Trivial pericardial effusion is present.   PHYSICAL EXAM  Temp:  [97.9 F (36.6 C)-98.3 F (36.8 C)] 98.1 F (36.7 C) (03/10 0700) Pulse Rate:  [59-96] 77 (03/10 0800) Resp:  [20-42] 21 (03/10 0800) BP: (130-168)/(47-74) 168/69 (03/10 0800) SpO2:  [91 %-98 %] 92 % (03/10 0800)  General - Well nourished, well developed, mild respiratory distress  Lungs: CTAB  Cardiovascular - Regular rhythm, bradycardia.  Neuro - awake alert, able to stay awake. Able to state her name on questioning but  bradyphasic and some moderate dysarthria. Orientated to self, age and people, stated "[name]" for month. Did not follow simple commands today.  PERRL, blinking to visual threat bilaterally. No forced gaze, able to attend to both sides. Mild right nasolabial fold flattening. Tongue midline in mouth.  Motot/Sensory: LUE 4-/5, RUE 2+/5, BLE withdraw to pain, seems left stronger than right. DTR 1+ and babinski positive bilaterally. Sensation, coordination and gait not tested.   ASSESSMENT/PLAN [name] is a 85 y.o. female with history of a permanent pacemaker for complete AV block, previous strokes, cerebral aneurysm clipping in 1993, hypothyroidism, hypertension, hyperlipidemia, dementia, glucose intolerance, coronary artery disease, and congestive heart failure presenting with right-sided weakness and aphasia.   She received IV TPA on Monday, 12/03/2018 at 0215.  Stroke: LEFT frontal/ACA territory infarct, likely due to Afib not on [facility]  Resultant  Aphasia and right hemiplegia  CT head - Acute nonhemorrhagic LEFT frontal/ACA territory infarct.  Multiple old infarcts.  MRI head - PPM  CTA H&N - Emergent LEFT A2 occlusion. Severe LEFT and moderate RIGHT supraclinoid ICA stenosis. Occluded LEFT MCA at aneurysm clip with intermediate collaterals. Right ICA bulb athero.   CT repeat evolving acute LEFT frontal/ACA territory infarct with new hemorrhagic conversion.   2D  Echo -  EF 55%.   LDL - 48  HgbA1c - 5.6  VTE prophylaxis - SCDs  Diet - NPO  clopidogrel 75 mg daily prior to admission, now on No antithrombotic given hemorrhagic conversion. Plan to start ASA 81 in 2 days and then repeat CT head in about one week and then decide on DOAC based on CT findings.   Patient counseled to be compliant with her antithrombotic medications  Ongoing aggressive stroke risk factor management  Therapy recommendations:  CIR  Disposition:  CIR; pending insurance and approval  Afib not on [facility]  Pacer has been detecting AF for long time  Deemed not AC candidate by cardiology due to hx of cerebral aneurysm   Since aneurysm has been secured with clipping, pt may be candidate for [facility] given multiple hx of strokes.  Pt is previously Dr. Clydene Fake pt and will let him aware  Had messages with Dr. Lovena Le who is OK with pt to be on DOAC if needed. Given her hemorrhagic conversion at left ACA, will plan  to start ASA 81 mg with a f/u CT in 1 week. Dr. Leonie Man will review the CT at that time and decide on the antithrombotic regimen.    Hx of stroke  Left MCA stroke due to aneurysm clipping in [place] stroke in 2005  06/2011 admitted for left facial and left sided weakness, CT neg, CTA h/n b/l siphon 50-70% stenosis, right MCA distal branch occlusion   Several TIAs over the years  Respiratory distress  Mild respiratory distress this am  Concerning for fluid overload  Will d/c IVF  Stat CXR  Consider lasix if CXR consistent with pulmonary edema  Hypertension  Stable . BP goal < 140 given hemorrhagic conversion . Resume home amlodipine and losartan . Long-term BP goal normotensive  Dementia   Baseline dementia  Some baseline aphasia as per son  Lives in independent living but with assistance from staff  On acricept  Hyperlipidemia  Lipid lowering medication PTA: Pravachol 40 mg daily  LDL 48, goal < 70  Resume Pravachol 40 mg daily    Continue statin at discharge  Dysphagia   Passed swallow   On full liquid diet  Speech to follow and consider advance diet if appropriate  Other Stroke Risk Factors  Advanced age  Coronary artery disease  CHF  Other Active Problems  3.9 cm ascending aorta.  Annual imaging recommended.  Hypokalemia 3.1 - supplement ->3.6  AVB s/p pacer   Hospital day # 1   This patient is critically ill due to left MCA and ACA infarcts, Afib not on AC, dysphagia and at significant risk of neurological worsening, death form recurrent stroke, afib RVR, heart failure, aspiration pneumonia. This patient's care requires constant monitoring of vital signs, hemodynamics, respiratory and cardiac monitoring, review of multiple databases, neurological assessment, discussion with family, other specialists and medical decision making of high complexity. I spent 40 minutes of neurocritical care time in the care of this patient. I had long discussion with son at bedside, updated pt current condition, treatment plan and potential prognosis. he expressed understanding and appreciation.   Rosalin Hawking, MD PhD Stroke Neurology 12/04/2018 12:12 PM   To contact Stroke Continuity provider, please refer to [email or web address] After hours, contact General Neurology

## 2018-12-04 NOTE — Progress Notes (Signed)
PT Cancellation Note  Patient Details Name: AYA GEISEL MRN: [id number] DOB: [birth year]   Cancelled Treatment:      Medical issues which prohibited therapy. Per nsg change in status. Will re-attempt when medically appropriate.    Reinaldo Berber, PT, DPT Acute Rehabilitation Services Pager: [phone] Office: [phone]     Reinaldo Berber 12/04/2018, 3:50 PM

## 2018-12-05 ENCOUNTER — Inpatient Hospital Stay (HOSPITAL_COMMUNITY): Payer: Medicare Other

## 2018-12-05 LAB — BASIC METABOLIC PANEL
Anion gap: 9 (ref 5–15)
BUN: 21 mg/dL (ref 8–23)
CHLORIDE: 102 mmol/L (ref 98–111)
CO2: 25 mmol/L (ref 22–32)
Calcium: 7.8 mg/dL — ABNORMAL LOW (ref 8.9–10.3)
Creatinine, Ser: 0.82 mg/dL (ref 0.44–1.00)
GFR calc Af Amer: >60 mL/min (ref >60)
GFR calc non Af Amer: >60 mL/min (ref >60)
Glucose, Bld: 136 mg/dL — ABNORMAL HIGH (ref 70–99)
Potassium: 2.9 mmol/L — ABNORMAL LOW (ref 3.5–5.1)
Sodium: 136 mmol/L (ref 135–145)

## 2018-12-05 LAB — CBC
HEMATOCRIT: 36.9 % (ref 36.0–46.0)
Hemoglobin: 12.4 g/dL (ref 12.0–15.0)
MCH: 31.4 pg (ref 26.0–34.0)
MCHC: 33.6 g/dL (ref 30.0–36.0)
MCV: 93.4 fL (ref 80.0–100.0)
Platelets: 211 10*3/uL (ref 150–400)
RBC: 3.95 MIL/uL (ref 3.87–5.11)
RDW: 12.4 % (ref 11.5–15.5)
WBC: 7.2 10*3/uL (ref 4.0–10.5)
nRBC: 0 % (ref 0.0–0.2)

## 2018-12-05 MED ORDER — POTASSIUM CHLORIDE CRYS ER 20 MEQ PO TBCR: 40.0000 meq | EXTENDED_RELEASE_TABLET | Freq: Three times a day (TID) | ORAL | Status: DC

## 2018-12-05 MED ORDER — POTASSIUM CHLORIDE 20 MEQ/15ML (10%) PO SOLN
40.0000 meq | ORAL | Status: DC
  Filled 2018-12-05: qty 30

## 2018-12-05 MED ORDER — ASPIRIN EC 81 MG PO TBEC
81.0000 mg | DELAYED_RELEASE_TABLET | Freq: Every day | ORAL | Status: DC
  Administered 2018-12-05 – 2018-12-07 (×3): 81 mg via ORAL
  Filled 2018-12-05 (×3): qty 1

## 2018-12-05 MED ORDER — ENOXAPARIN SODIUM 40 MG/0.4ML ~~LOC~~ SOLN
40.0000 mg | SUBCUTANEOUS | Status: DC
  Administered 2018-12-05 – 2018-12-07 (×3): 40 mg via SUBCUTANEOUS
  Filled 2018-12-05 (×3): qty 0.4

## 2018-12-05 MED ORDER — POTASSIUM CHLORIDE CRYS ER 20 MEQ PO TBCR
40.0000 meq | EXTENDED_RELEASE_TABLET | ORAL | Status: AC
  Administered 2018-12-05 (×3): 40 meq via ORAL
  Filled 2018-12-05 (×3): qty 2

## 2018-12-05 NOTE — Progress Notes (Signed)
  Speech Language Pathology Treatment: Cognitive-Linquistic  Patient Details Name: [name] MRN: [id number] DOB: [birth year] Today's Date: 12/05/2018 Time: 6415-8309 SLP Time Calculation (min) (ACUTE ONLY): 30 min  Assessment / Plan / Recommendation Clinical Impression  Pt seen for aphasia treatment.  Son present.  Demonstrates ongoing functional ability to recite automatic sequences.  Named eight items to confrontation without difficulty.  Responsive naming tasks achieved with 75% accuracy for functional items.  Requires verbal cues re: shifting set, new conversation topics, new motor demands in order to cease perseveratory responses.  Pt continues to require verbal/visual cues to follow commands that are out of context and less predictable. Output continues to be dysfluent, as anticipated after an ACA CVA; comprehension deficits are likely related to baseline dementia and chronic infarcts in left cortex/subcortex.  Per family, pt ate breakfast well with no overt s/s of aspiration.  RR is improved today.   SLP will continue to follow for aphasia/swallowing safety.     HPI HPI: 85 y.o. female with history of a permanent pacemaker for complete AV block, previous strokes, cerebral aneurysm clipping in 1993, hypothyroidism, hypertension, hyperlipidemia, dementia, glucose intolerance, coronary artery disease, and congestive heart failure presented with right-sided weakness and aphasia.  CT head - Acute nonhemorrhagic LEFT frontal/ACA territory infarct; TPA started 12/03/18. PTA, pt resided at [facility] with assistance from personal caregivers. She has baseline mild dementia and mild aphasia, but is normally interactive and conversational.       SLP Plan  Continue with current plan of care       Recommendations  Diet recommendations: Dysphagia 1 (puree);Thin liquid Liquids provided via: Cup;Straw Medication Administration: Crushed with puree Supervision: Full  supervision/cueing for compensatory strategies Compensations: Minimize environmental distractions                Oral Care Recommendations: Oral care BID Follow up Recommendations: Inpatient Rehab SLP Visit Diagnosis: Aphasia (R47.01) Plan: Continue with current plan of care       GO               [name] L. Tivis Ringer, [place] CCC/SLP Acute Rehabilitation Services Office number [phone] Pager [phone]  [name] 12/05/2018, 1:56 PM

## 2018-12-05 NOTE — Progress Notes (Signed)
Physical Therapy Treatment Patient Details Name: [name] MRN: [id number] DOB: 12/08/1921 Today's Date: 12/05/2018    History of Present Illness 85 y.o. female admitted on 12/02/18 for R sided weakness and aphasia.  Pt give IV tPA.  Pt dx with L frontal/ACA territory infarct likely due to A-fib.  Pt with other significant PMH of TIA, pacemaker, HTN, femoral fx s/p L hip arthroplasty, dementia, CAD, CHF, arthritis (particularly in her hands), s/p cerebral aneurysm s/p coiling.      PT Comments    Patient progressing with therapy tolerating small distance gait in room today. Mod-Max A of 2 for all OOB mobility at this time. Emotionally labile and following commands 70% of time. HOH. Cont to rec CIR.   Follow Up Recommendations  CIR     Equipment Recommendations  None recommended by PT    Recommendations for Other Services Rehab consult     Precautions / Restrictions Precautions Precautions: Fall Precaution Comments: at risk for skin breakdown    Mobility  Bed Mobility Overal bed mobility: Needs Assistance Bed Mobility: Sit to Supine       Sit to supine: Max assist      Transfers Overall transfer level: Needs assistance   Transfers: Sit to/from Stand;Stand Pivot Transfers Sit to Stand: Max assist Stand pivot transfers: Max assist;+2 safety/equipment       General transfer comment: facilitating steps  Ambulation/Gait Ambulation/Gait assistance: Mod assist;+2 physical assistance Gait Distance (Feet): 5 Feet Assistive device: 2 person hand held assist Gait Pattern/deviations: Step-to pattern;Step-through pattern Gait velocity: decreased   General Gait Details: mod A x 2 hand held and gait belt to ambulate short steps from chair to commode to bed   Stairs             Wheelchair Mobility    Modified Rankin (Stroke Patients Only)       Balance Overall balance assessment: Needs assistance Sitting-balance support: Feet supported;Single  extremity supported Sitting balance-[name] Scale: Poor Sitting balance - Comments: R lateral lean; ableto maintain midline until distracted     Standing balance-[name] Scale: Poor                              Cognition Arousal/Alertness: Awake/alert Behavior During Therapy: Flat affect Overall Cognitive Status: Difficult to assess Area of Impairment: Attention;Awareness;Safety/judgement;Memory                 Orientation Level: Disoriented to;Time Current Attention Level: Sustained   Following Commands: Follows one step commands with increased time Safety/Judgement: Decreased awareness of safety;Decreased awareness of deficits Awareness: Intellectual Problem Solving: Slow processing;Decreased initiation;Difficulty sequencing General Comments: emotional lability and inappropriate responses      Exercises      General Comments General comments (skin integrity, edema, etc.): VSS      Pertinent Vitals/Pain Pain Assessment: Faces Faces Pain Scale: Hurts little more Pain Location: all over Pain Descriptors / Indicators: Discomfort;Grimacing Pain Intervention(s): Monitored during session;Limited activity within patient's tolerance    Home Living Family/patient expects to be discharged to:: Inpatient rehab Living Arrangements: Alone Available Help at Discharge: Personal care attendant;Available 24 hours/day Type of Home: Independent living facility Home Access: Level entry   Home Layout: One level Home Equipment: Walker - 4 wheels      Prior Function Level of Independence: Needs assistance  Gait / Transfers Assistance Needed: walks with a rollator her "buggy" ADL's / Homemaking Assistance Needed: assist from aids  PT Goals (current goals can now be found in the care plan section) Acute Rehab PT Goals Patient Stated Goal: to do more for herself PT Goal Formulation: With patient/family Time For Goal Achievement: 12/17/18 Potential to Achieve Goals:  Fair Progress towards PT goals: Progressing toward goals    Frequency    Min 4X/week      PT Plan Current plan remains appropriate    Co-evaluation PT/OT/SLP Co-Evaluation/Treatment: Yes Reason for Co-Treatment: Complexity of the patient's impairments (multi-system involvement);For patient/therapist safety;Necessary to address cognition/behavior during functional activity   OT goals addressed during session: ADL's and self-care      AM-PAC PT "6 Clicks" Mobility   Outcome Measure  Help needed turning from your back to your side while in a flat bed without using bedrails?: Total Help needed moving from lying on your back to sitting on the side of a flat bed without using bedrails?: Total Help needed moving to and from a bed to a chair (including a wheelchair)?: Total Help needed standing up from a chair using your arms (e.g., wheelchair or bedside chair)?: Total Help needed to walk in hospital room?: Total Help needed climbing 3-5 steps with a railing? : Total 6 Click Score: 6    End of Session Equipment Utilized During Treatment: Gait belt Activity Tolerance: Patient tolerated treatment well Patient left: in bed;with call bell/phone within reach;with bed alarm set;with family/visitor present Nurse Communication: Mobility status PT Visit Diagnosis: Muscle weakness (generalized) (M62.81);Difficulty in walking, not elsewhere classified (R26.2);Hemiplegia and hemiparesis Hemiplegia - Right/Left: Right Hemiplegia - dominant/non-dominant: Dominant Hemiplegia - caused by: Cerebral infarction     Time: 1594-5859 PT Time Calculation (min) (ACUTE ONLY): 34 min  Charges:  $Therapeutic Activity: 8-22 mins                    Reinaldo Berber, PT, DPT Acute Rehabilitation Services Pager: [phone] Office: [phone]    Reinaldo Berber 12/05/2018, 7:11 PM

## 2018-12-05 NOTE — Progress Notes (Signed)
Occupational Therapy Evaluation Patient Details Name: [name] MRN: [id number] DOB: 04/08/1922 Today's Date: 12/05/2018    History of Present Illness 85 y.o. female admitted on 12/02/18 for R sided weakness and aphasia.  Pt give IV tPA.  Pt dx with L frontal/ACA territory infarct likely due to A-fib.  Pt with other significant PMH of TIA, pacemaker, HTN, femoral fx s/p L hip arthroplasty, dementia, CAD, CHF, arthritis (particularly in her hands), s/p cerebral aneurysm s/p coiling.     Clinical Impression   PTA, pt able to state that she lived at [facility] and walked with a rollator. Received assistance with self care however, unsure of amount of assistance required. Pt required Max A with sit - stand and  stand pivot transfers today with increased posterior lean and more difficulty achieving midline upright postural control. Pt is very motivated to become more independent and will need post acute rehab. Pt will most likely need 24/7 S after DC from rehab. Will follow acutely.     Follow Up Recommendations  Supervision/Assistance - 24 hour;CIR    Equipment Recommendations  3 in 1 bedside commode    Recommendations for Other Services Rehab consult     Precautions / Restrictions Precautions Precautions: Fall Precaution Comments: at risk for skin breakdown      Mobility Bed Mobility Overal bed mobility: Needs Assistance Bed Mobility: Sit to Supine       Sit to supine: Max assist      Transfers Overall transfer level: Needs assistance   Transfers: Sit to/from Stand;Stand Pivot Transfers Sit to Stand: Max assist Stand pivot transfers: Max assist;+2 safety/equipment       General transfer comment: facilitating steps    Balance Overall balance assessment: Needs assistance   Sitting balance-[name] Scale: Poor Sitting balance - Comments: R lateral lean; ableto maintain midline until distracted     Standing balance-[name] Scale: Zero                             ADL either performed or assessed with clinical judgement   ADL Overall ADL's : Needs assistance/impaired Eating/Feeding: Moderate assistance   Grooming: Moderate assistance;Sitting   Upper Body Bathing: Maximal assistance;Sitting   Lower Body Bathing: Maximal assistance;Sit to/from stand   Upper Body Dressing : Maximal assistance;Sitting   Lower Body Dressing: Maximal assistance;Sit to/from stand   Toilet Transfer: Maximal assistance;+2 for physical assistance;BSC;Stand-pivot   Toileting- Clothing Manipulation and Hygiene: Total assistance Toileting - Clothing Manipulation Details (indicate cue type and reason): able to notify therapist of need for BM; pt [facility]fer[facility] to [facility] with Max A     Functional mobility during ADLs: Maximal assistance;+2 for safety/equipment       Vision Baseline Vision/History: Wears glasses Additional Comments: will further assess; glasses not in room     Perception Perception Comments: will further assess; poor sense of midline   Praxis Praxis Praxis tested?: Deficits Deficits: Initiation    Pertinent Vitals/Pain Pain Assessment: Faces Faces Pain Scale: Hurts little more Pain Location: all over Pain Descriptors / Indicators: Discomfort;Grimacing Pain Intervention(s): Limited activity within patient's tolerance     Hand Dominance Right   Extremity/Trunk Assessment Upper Extremity Assessment Upper Extremity Assessment: RUE deficits/detail;LUE deficits/detail RUE Deficits / Details: RUE weakness; staets she had weakness prior to this CVA. Atempting to use funcitonally; isonlated movemetn pattenrs; PROM through full ROM. shoulder 2/5; elbow 3/5 hand 3/5 RUE Coordination: decreased fine motor;decreased gross motor LUE Deficits / Details: appears  stronger than R; attempting to use functionally but generally weak; isolated movemetn patterns. ableto grasp/release; difficulty moving against gravity due to weakness LUE Coordination:  decreased fine motor;decreased gross motor   Lower Extremity Assessment Lower Extremity Assessment: Defer to PT evaluation   Cervical / Trunk Assessment Cervical / Trunk Assessment: Kyphotic   Communication Communication Communication: HOH;Expressive difficulties   Cognition Arousal/Alertness: Awake/alert Behavior During Therapy: Flat affect(labile at times) Overall Cognitive Status: Difficult to assess Area of Impairment: Attention;Awareness;Safety/judgement;Memory                 Orientation Level: Disoriented to;Time Current Attention Level: Sustained   Following Commands: Follows one step commands with increased time Safety/Judgement: Decreased awareness of safety;Decreased awareness of deficits Awareness: Intellectual Problem Solving: Slow processing;Decreased initiation;Difficulty sequencing     General Comments       Exercises     Shoulder Instructions      Home Living Family/patient expects to be discharged to:: Inpatient rehab Living Arrangements: Alone Available Help at Discharge: Personal care attendant;Available 24 hours/day Type of Home: Independent living facility Home Access: Level entry     Home Layout: One level     Bathroom Shower/Tub: Occupational psychologist: Handicapped height Bathroom Accessibility: Yes How Accessible: Accessible via walker Home Equipment: [place] - 4 wheels          Prior Functioning/Environment Level of Independence: Needs assistance  Gait / Transfers Assistance Needed: walks with a rollator her "buggy" ADL's / Homemaking Assistance Needed: assist from aids  Communication / Swallowing Assistance Needed: HOH          OT Problem List: Decreased strength;Decreased range of motion;Decreased activity tolerance;Impaired balance (sitting and/or standing);Impaired vision/perception;Decreased coordination;Decreased cognition;Decreased safety awareness;Decreased knowledge of use of DME or AE;Impaired  tone;Impaired UE functional use;Pain      OT Treatment/Interventions: Self-care/ADL training;Therapeutic exercise;Neuromuscular education;DME and/or AE instruction;Therapeutic activities;Cognitive remediation/compensation;Visual/perceptual remediation/compensation;Patient/family education;Balance training    OT Goals(Current goals can be found in the care plan section) Acute Rehab OT Goals Patient Stated Goal: to do more for herself OT Goal Formulation: With patient Time For Goal Achievement: 12/19/18 Potential to Achieve Goals: Good  OT Frequency: Min 2X/week   Barriers to D/C:            Co-evaluation PT/OT/SLP Co-Evaluation/Treatment: Yes Reason for Co-Treatment: Complexity of the patient's impairments (multi-system involvement);To address functional/ADL transfers   OT goals addressed during session: ADL's and self-care      AM-PAC OT "6 Clicks" Daily Activity     Outcome Measure Help from another person eating meals?: A Lot Help from another person taking care of personal grooming?: A Lot Help from another person toileting, which includes using toliet, bedpan, or urinal?: A Lot Help from another person bathing (including washing, rinsing, drying)?: A Lot Help from another person to put on and taking off regular upper body clothing?: A Lot Help from another person to put on and taking off regular lower body clothing?: A Lot 6 Click Score: 12   End of Session Equipment Utilized During Treatment: Gait belt Nurse Communication: Mobility status  Activity Tolerance: Patient tolerated treatment well Patient left: in bed;with call bell/phone within reach;with nursing/sitter in room  OT Visit Diagnosis: Other abnormalities of gait and mobility (R26.89);Muscle weakness (generalized) (M62.81);Other symptoms and signs involving cognitive function;Cognitive communication deficit (R41.841);Pain Symptoms and signs involving cognitive functions: Cerebral infarction Pain - part of body:  (all over)  Time: 1994-1290 OT Time Calculation (min): 30 min Charges:  OT General Charges $OT Visit: 1 Visit OT Evaluation $OT Eval Moderate Complexity: Jette, OT/L   Acute OT Clinical Specialist Acute Rehabilitation Services Pager [phone] Office [phone]   [facility] 12/05/2018, 6:37 PM

## 2018-12-05 NOTE — Progress Notes (Signed)
Pt tx from bed to chair with gait belt. Felt more like levitating the patient even with locking knees with mine. Will use hoyer lift to tx back to bed.

## 2018-12-05 NOTE — Progress Notes (Signed)
STROKE TEAM PROGRESS NOTE   SUBJECTIVE (INTERVAL HISTORY)  Son is at bedside.  Patient is more awake alert today, able to answer most questions appropriately.  Still not orientated to time.  Still has sleep apnea pattern of breathing.  Will start aspirin today.  PT/OT recommend CR.   OBJECTIVE Vitals:   12/05/18 1300 12/05/18 1400 12/05/18 1500 12/05/18 1632  BP: (!) 143/56 (!) 134/55  (!) 134/59  Pulse: (!) 59 60 62 (!) 59  Resp: (!) 32 (!) 29 (!) 37 (!) 22  Temp:   98 F (36.7 C)   TempSrc:   Oral   SpO2: 95% 95% 95% 94%  Weight:      Height:        CBC:  Recent Labs  Lab 12/03/18 0118  12/04/18 0353 12/05/18 0303  WBC 5.8   < > 7.5 7.2  NEUTROABS 4.3  --   --   --   HGB 12.9   < > 12.9 12.4  HCT 39.5   < > 37.9 36.9  MCV 96.3   < > 94.8 93.4  PLT 224   < > 226 211   < > = values in this interval not displayed.    Basic Metabolic Panel:  Recent Labs  Lab 12/04/18 0353 12/05/18 0303  NA 135 136  K 3.6 2.9*  CL 107 102  CO2 19* 25  GLUCOSE 116* 136*  BUN 19 21  CREATININE 0.89 0.82  CALCIUM 8.0* 7.8*    Lipid Panel:     Component Value Date/Time   CHOL 101 12/03/2018 0320   TRIG 66 12/03/2018 0320   HDL 40 (L) 12/03/2018 0320   CHOLHDL 2.5 12/03/2018 0320   VLDL 13 12/03/2018 0320   LDLCALC 48 12/03/2018 0320   HgbA1c:  Lab Results  Component Value Date   HGBA1C 5.6 12/03/2018   Urine Drug Screen: No results found for: LABOPIA, COCAINSCRNUR, LABBENZ, AMPHETMU, THCU, LABBARB  Alcohol Level     Component Value Date/Time   ETH <10 12/03/2018 0119    IMAGING  Ct Angio Head W Or Wo Contrast Ct Angio Neck W Or Wo Contrast Ct Cerebral Perfusion W Contrast 12/03/2018 IMPRESSION:   CTA NECK:  1. No hemodynamically significant stenosis ICA's.  2. Patent vertebral arteries.  3. An incidental finding of potential clinical significance has been found. 3.9 cm ascending aorta. Recommend annual imaging followup by CTA or MRA. This recommendation  follows 2010 ACCF/AHA/AATS/ACR/ASA/SCA/SCAI/SIR/STS/SVM Guidelines for the Diagnosis and Management of Patients with Thoracic Aortic Disease. Circulation.2010; 121: B638-G536. Aortic aneurysm NOS (ICD10-I71.9)**   CTA HEAD:  1. Emergent LEFT A2 occlusion, poor collateralization by single-phase CTA.  2. Severe LEFT and moderate RIGHT supraclinoid ICA stenosis.  3. Occluded LEFT MCA at aneurysm clip, intermediate collaterals by single-phase   CTA. CT PERFUSION:  1. Rapid data processing failed.    Ct Head Code Stroke Wo Contrast 12/03/2018  IMPRESSION:  1. Acute nonhemorrhagic LEFT frontal/ACA territory infarct.  2. Old large LEFT MCA territory infarct, status post clipping of LEFT MCA aneurysm.  3. Moderate to severe chronic small vessel ischemic changes. Old small basal ganglia, thalami and cerebellar infarcts.  4. ASPECTS is 10.   Transthoracic Echocardiogram -   1. The left ventricle has a visually estimated ejection fraction of of 55%. The cavity size was normal. There is moderately increased left ventricular wall thickness. Left ventricular diastolic Doppler parameters are indeterminate. No evidence of left   ventricular regional wall motion abnormalities. Septal-lateral  dyssynchrony consistent with RV pacing.   2. The right ventricle has normal systolic function. The cavity was  normal. There is no increase in right ventricular wall thickness.   3. Left atrial size was severely dilated.   4. Right atrial size was mildly dilated.   5. The mitral valve is degenerative. Moderate calcification of the  mitral valve leaflet. There is moderate to severe mitral annular  calcification present. Mild mitral regurgitation. There is no more than  mild mitral stenosis present, mean gradient 4   mmHg.   6. Tricuspid valve regurgitation is moderate.   7. The aortic valve is tricuspid Moderate calcification of the aortic  valve. Aortic valve regurgitation is moderate by color flow Doppler. no   stenosis of the aortic valve.   8. The ascending aorta is normal in size and structure.   9. There is mild dilatation of the aortic root measuring 37 mm.  10. The inferior vena cava was dilated in size with <50% respiratory  variability. PA systolic pressure 45 mmHg.  11. Trivial pericardial effusion is present.   PHYSICAL EXAM  Temp:  [97.7 F (36.5 C)-99.2 F (37.3 C)] 98 F (36.7 C) (03/11 1500) Pulse Rate:  [58-63] 59 (03/11 1632) Resp:  [15-40] 22 (03/11 1632) BP: (126-156)/(52-70) 134/59 (03/11 1632) SpO2:  [89 %-98 %] 94 % (03/11 1632)  General - Well nourished, well developed, sleep apnea breathing pattern  Lungs: CTAB  Cardiovascular - pacer rhythm  Neuro - awake, alert, but lethargic. Able to state her name and age on questioning, orientated to people but disorientated to time or place, mild dysarthria. PERRL, blinking to visual threat bilaterally. No forced gaze, able to attend to both sides. Mild right nasolabial fold flattening. Tongue midline in mouth. LUE 4-/5, RUE 3/5, LLE 2+/5, RLE withdraw to pain. DTR 1+ and babinski positive on the right. Sensation symmetrical, coordination not corporative and gait not tested.   ASSESSMENT/PLAN [name] is a 85 y.o. female with history of a permanent pacemaker for complete AV block, previous strokes, cerebral aneurysm clipping in 1993, hypothyroidism, hypertension, hyperlipidemia, dementia, glucose intolerance, coronary artery disease, and congestive heart failure presenting with right-sided weakness and aphasia.   She received IV TPA on Monday, 12/03/2018 at 0215.  Stroke: LEFT frontal/ACA territory infarct, likely due to Afib not on [facility]  Resultant  Aphasia and right hemiplegia  CT head - Acute nonhemorrhagic LEFT frontal/ACA territory infarct.  Multiple old infarcts.  MRI head - PPM  CTA H&N - Emergent LEFT A2 occlusion. Severe LEFT and moderate RIGHT supraclinoid ICA stenosis. Occluded LEFT MCA at aneurysm  clip with intermediate collaterals. Right ICA bulb athero.   CT repeat evolving acute LEFT frontal/ACA territory infarct with new hemorrhagic conversion.   2D Echo -  EF 55%.   LDL - 48  HgbA1c - 5.6  VTE prophylaxis -Lovenox  Diet - NPO  clopidogrel 75 mg daily prior to admission, now on No antithrombotic given hemorrhagic conversion. Start ASA 81 daily and then repeat CT head in about one week and then decide on DOAC based on CT findings.   Patient counseled to be compliant with her antithrombotic medications  Ongoing aggressive stroke risk factor management  Therapy recommendations:  CIR  Disposition:  CIR; pending insurance and approval  Afib not on [facility]  Pacer has been detecting AF for long time  Deemed not AC candidate by cardiology due to hx of cerebral aneurysm   Since aneurysm has been secured with clipping,  pt may be candidate for [facility] given multiple hx of strokes.  Pt is previously Dr. Clydene Fake pt and will let him aware  Had messages with Dr. Lovena Le who is OK with pt to be on DOAC if needed. Given her hemorrhagic conversion at left ACA, will start ASA 81 mg a day with a f/u CT in 1 week. Dr. Leonie Man will review the CT at that time and decide on the antithrombotic regimen.    Hx of stroke  Left MCA stroke due to aneurysm clipping in 1993  Occular stroke in 2005  06/2011 admitted for left facial and left sided weakness, CT neg, CTA h/n b/l siphon 50-70% stenosis, right MCA distal branch occlusion   Several TIAs over the years  Respiratory distress with sleep apnea pattern  Mild respiratory distress with sleep apnea pattern  Will d/c IVF  Stay post Lasix  CXR 12/04/2018 left lower lobe atelectasis versus consolidation  Repeat CXR pending  Hypertension  Stable within the goal . BP goal < 140 given hemorrhagic conversion . Resume home amlodipine and losartan . Long-term BP goal normotensive  Dementia   Baseline dementia  Some baseline aphasia as per  son  Lives in independent living but with assistance from staff  On acricept  Hyperlipidemia  Lipid lowering medication PTA: Pravachol 40 mg daily  LDL 48, goal < 70  Resume Pravachol 40 mg daily   Continue statin at discharge  Dysphagia   Passed swallow   On dysphagia 1 and thin liquid  Speech on board  Other Stroke Risk Factors  Advanced age  Coronary artery disease  CHF  Other Active Problems  3.9 cm ascending aorta.  Annual imaging recommended.  Hypokalemia 3.1 - supplement ->3.6->2.9 - supplement  AVB s/p pacer   Hospital day # 2   This patient is critically ill due to left MCA and ACA infarcts, Afib not on AC, dysphagia and at significant risk of neurological worsening, death form recurrent stroke, afib RVR, heart failure, aspiration pneumonia. This patient's care requires constant monitoring of vital signs, hemodynamics, respiratory and cardiac monitoring, review of multiple databases, neurological assessment, discussion with family, other specialists and medical decision making of high complexity. I spent 35 minutes of neurocritical care time in the care of this patient. I had long discussion with son at bedside, updated pt current condition, treatment plan and potential prognosis. He expressed understanding and appreciation.   [name] Hawking, MD PhD Stroke Neurology 12/05/2018 5:40 PM   To contact Stroke Continuity provider, please refer to [email or web address] After hours, contact General Neurology

## 2018-12-06 LAB — BASIC METABOLIC PANEL
Anion gap: 7 (ref 5–15)
BUN: 23 mg/dL (ref 8–23)
CALCIUM: 8.1 mg/dL — AB (ref 8.9–10.3)
CO2: 23 mmol/L (ref 22–32)
Chloride: 107 mmol/L (ref 98–111)
Creatinine, Ser: 0.81 mg/dL (ref 0.44–1.00)
GFR calc Af Amer: >60 mL/min (ref >60)
Glucose, Bld: 127 mg/dL — ABNORMAL HIGH (ref 70–99)
Potassium: 4.1 mmol/L (ref 3.5–5.1)
Sodium: 137 mmol/L (ref 135–145)

## 2018-12-06 LAB — CBC
HCT: 38.2 % (ref 36.0–46.0)
Hemoglobin: 13 g/dL (ref 12.0–15.0)
MCH: 32.1 pg (ref 26.0–34.0)
MCHC: 34 g/dL (ref 30.0–36.0)
MCV: 94.3 fL (ref 80.0–100.0)
PLATELETS: 232 10*3/uL (ref 150–400)
RBC: 4.05 MIL/uL (ref 3.87–5.11)
RDW: 12.6 % (ref 11.5–15.5)
WBC: 8 10*3/uL (ref 4.0–10.5)
nRBC: 0 % (ref 0.0–0.2)

## 2018-12-06 NOTE — Progress Notes (Addendum)
STROKE TEAM PROGRESS NOTE   SUBJECTIVE (INTERVAL HISTORY)  ASA started yesterday. PT/OT recommend CIR. NAD   OBJECTIVE Vitals:   12/05/18 2020 12/05/18 2030 12/05/18 2359 12/06/18 0400  BP: (!) 142/56     Pulse: 60     Resp: 18     Temp:  97.9 F (36.6 C) 98.4 F (36.9 C) 98.4 F (36.9 C)  TempSrc:  Oral Oral Oral  SpO2: 96%     Weight:      Height:        CBC:  Recent Labs  Lab 12/03/18 0118  12/05/18 0303 12/06/18 0527  WBC 5.8   < > 7.2 8.0  NEUTROABS 4.3  --   --   --   HGB 12.9   < > 12.4 13.0  HCT 39.5   < > 36.9 38.2  MCV 96.3   < > 93.4 94.3  PLT 224   < > 211 232   < > = values in this interval not displayed.    Basic Metabolic Panel:  Recent Labs  Lab 12/05/18 0303 12/06/18 0527  NA 136 137  K 2.9* 4.1  CL 102 107  CO2 25 23  GLUCOSE 136* 127*  BUN 21 23  CREATININE 0.82 0.81  CALCIUM 7.8* 8.1*    Lipid Panel:     Component Value Date/Time   CHOL 101 12/03/2018 0320   TRIG 66 12/03/2018 0320   HDL 40 (L) 12/03/2018 0320   CHOLHDL 2.5 12/03/2018 0320   VLDL 13 12/03/2018 0320   LDLCALC 48 12/03/2018 0320   HgbA1c:  Lab Results  Component Value Date   HGBA1C 5.6 12/03/2018   Urine Drug Screen: No results found for: LABOPIA, COCAINSCRNUR, LABBENZ, AMPHETMU, THCU, LABBARB  Alcohol Level     Component Value Date/Time   ETH <10 12/03/2018 0119    IMAGING  Ct Angio Head W Or Wo Contrast Ct Angio Neck W Or Wo Contrast Ct Cerebral Perfusion W Contrast 12/03/2018 IMPRESSION:   CTA NECK:  1. No hemodynamically significant stenosis ICA's.  2. Patent vertebral arteries.  3. An incidental finding of potential clinical significance has been found. 3.9 cm ascending aorta. Recommend annual imaging followup by CTA or MRA. This recommendation follows 2010 ACCF/AHA/AATS/ACR/ASA/SCA/SCAI/SIR/STS/SVM Guidelines for the Diagnosis and Management of Patients with Thoracic Aortic Disease. Circulation.2010; 121: G956-O130. Aortic aneurysm NOS  (ICD10-I71.9)**   CTA HEAD:  1. Emergent LEFT A2 occlusion, poor collateralization by single-phase CTA.  2. Severe LEFT and moderate RIGHT supraclinoid ICA stenosis.  3. Occluded LEFT MCA at aneurysm clip, intermediate collaterals by single-phase   CTA. CT PERFUSION:  1. Rapid data processing failed.    Ct Head Code Stroke Wo Contrast 12/03/2018  IMPRESSION:  1. Acute nonhemorrhagic LEFT frontal/ACA territory infarct.  2. Old large LEFT MCA territory infarct, status post clipping of LEFT MCA aneurysm.  3. Moderate to severe chronic small vessel ischemic changes. Old small basal ganglia, thalami and cerebellar infarcts.  4. ASPECTS is 10.   Transthoracic Echocardiogram -   1. The left ventricle has a visually estimated ejection fraction of of 55%. The cavity size was normal. There is moderately increased left ventricular wall thickness. Left ventricular diastolic Doppler parameters are indeterminate. No evidence of left   ventricular regional wall motion abnormalities. Septal-lateral  dyssynchrony consistent with RV pacing.   2. The right ventricle has normal systolic function. The cavity was  normal. There is no increase in right ventricular wall thickness.   3. Left atrial size  was severely dilated.   4. Right atrial size was mildly dilated.   5. The mitral valve is degenerative. Moderate calcification of the  mitral valve leaflet. There is moderate to severe mitral annular  calcification present. Mild mitral regurgitation. There is no more than  mild mitral stenosis present, mean gradient 4   mmHg.   6. Tricuspid valve regurgitation is moderate.   7. The aortic valve is tricuspid Moderate calcification of the aortic  valve. Aortic valve regurgitation is moderate by color flow Doppler. no  stenosis of the aortic valve.   8. The ascending aorta is normal in size and structure.   9. There is mild dilatation of the aortic root measuring 37 mm.  10. The inferior vena cava was dilated in  size with <50% respiratory  variability. PA systolic pressure 45 mmHg.  11. Trivial pericardial effusion is present.   PHYSICAL EXAM   Temp:  [97.9 F (36.6 C)-98.4 F (36.9 C)] 98.4 F (36.9 C) (03/12 0400) Pulse Rate:  [59-63] 60 (03/11 2020) Resp:  [15-37] 18 (03/11 2020) BP: (134-156)/(53-70) 142/56 (03/11 2020) SpO2:  [92 %-96 %] 96 % (03/11 2020)  General - Well nourished, well developed,   Lungs: CTAB  Cardiovascular - pacer rhythm  Neuro - awake, alert, but lethargic. Able to state her name and age on questioning, orientated to people but disorientated to time or place, mild dysarthria. PERRL, blinking to visual threat bilaterally. No forced gaze, able to attend to both sides. Mild right nasolabial fold flattening. Tongue midline in mouth. LUE 4-/5, RUE 3/5, LLE 2+/5, RLE withdraw to pain. DTR 1+ and babinski positive on the right. Sensation symmetrical, coordination not corporative and gait not tested.   ASSESSMENT/PLAN [name] is a 85 y.o. female with history of a permanent pacemaker for complete AV block, previous strokes, cerebral aneurysm clipping in 1993, hypothyroidism, hypertension, hyperlipidemia, dementia, glucose intolerance, coronary artery disease, and congestive heart failure presenting with right-sided weakness and aphasia.   She received IV TPA on Monday, 12/03/2018 at 0215.  Stroke: LEFT frontal/ACA territory infarct, likely due to Afib not on [facility]  Resultant  Aphasia and right hemiplegia  CT head - Acute nonhemorrhagic LEFT frontal/ACA territory infarct.  Multiple old infarcts.  MRI head - PPM  CTA H&N - Emergent LEFT A2 occlusion. Severe LEFT and moderate RIGHT supraclinoid ICA stenosis. Occluded LEFT MCA at aneurysm clip with intermediate collaterals. Right ICA bulb athero.   CT repeat evolving acute LEFT frontal/ACA territory infarct with new hemorrhagic conversion.   2D Echo -  EF 55%.   LDL - 48  HgbA1c - 5.6  VTE prophylaxis  -Lovenox  Diet - NPO  clopidogrel 75 mg daily prior to admission, now on No antithrombotic given hemorrhagic conversion. Start ASA 81 daily and then repeat CT head in about one week and then decide on DOAC based on CT findings.   Patient counseled to be compliant with her antithrombotic medications  Ongoing aggressive stroke risk factor management  Therapy recommendations:  CIR  Disposition:  CIR; pending insurance and approval  Afib not on [facility]  Pacer has been detecting AF for long time  Deemed not AC candidate by cardiology due to hx of cerebral aneurysm   Since aneurysm has been secured with clipping, pt may be candidate for [facility] given multiple hx of strokes.  Pt is previously Dr. Clydene Fake pt and will let him aware  Had messages with Dr. Lovena Le who is OK with pt to be on  DOAC if needed. Given her hemorrhagic conversion at left ACA, will start ASA 81 mg daily with a f/u CT in 1 week. Dr. Leonie Man will review the CT at that time and decide on the antithrombotic regimen.    Hx of stroke  Left MCA stroke due to aneurysm clipping in 1993  Occular stroke in 2005  06/2011 admitted for left facial and left sided weakness, CT neg, CTA h/n b/l siphon 50-70% stenosis, right MCA distal branch occlusion   Several TIAs over the years  Respiratory distress with sleep apnea pattern  Mild respiratory distress with sleep apnea pattern  Will d/c IVF  Stay post Lasix  CXR 12/04/2018 left lower lobe atelectasis versus consolidation  Repeat CXR pending  Hypertension  Stable within the goal . BP goal < 140 given hemorrhagic conversion . Resume home amlodipine and losartan . Long-term BP goal normotensive  Dementia   Baseline dementia  Some baseline aphasia as per son  Lives in independent living but with assistance from staff  On acricept  Hyperlipidemia  Lipid lowering medication PTA: Pravachol 40 mg daily  LDL 48, goal < 70  Resume Pravachol 40 mg daily   Continue statin  at discharge  Dysphagia   Passed swallow   On dysphagia 1 and thin liquid  Speech on board  Other Stroke Risk Factors  Advanced age  Coronary artery disease  CHF  Other Active Problems  3.9 cm ascending aorta.  Annual imaging recommended.  Hypokalemia 3.1 - supplement ->3.6->2.9 - supplement  AVB s/p pacer   Hospital day # [place], MSN, NP-C [facility] [phone]  ATTENDING NOTE: I reviewed above note and agree with the assessment and plan. Pt was seen and examined.   Patient daughter-in-law at bedside.  Patient sitting up in bed, awake alert, answers questions appropriately, orientated to self, people and place, still disorientated on age, situation or time.  Able to name and repeat.  Paucity of speech.  Right facial droop resolved and the right arm weakness much improved with at least 4/5 strength, right lower extremity 3-/5 and left lower extremity 3+/5.  Still has sleep apnea breathing pattern during sleep.  On aspirin 81, tolerating well.  Recommend to repeat CT head in 1 week and then decide on antithrombotic regimen.  Repeat chest x-ray yesterday showed improving infiltration or atelectasis in the left lung base.  Patient on diet, no IV fluid needed at this time.  Will transfer to floor.  PT/OT recommend CIR.  Plan for CIR discharge tomorrow.  Rosalin Hawking, MD PhD Stroke Neurology 12/06/2018 5:31 PM   To contact Stroke Continuity provider, please refer to [email or web address] After hours, contact General Neurology

## 2018-12-07 ENCOUNTER — Other Ambulatory Visit: Payer: Self-pay

## 2018-12-07 ENCOUNTER — Inpatient Hospital Stay (HOSPITAL_COMMUNITY)
Admission: RE | Admit: 2018-12-07 | Discharge: 2018-12-26 | DRG: 057 | Disposition: A | Discharge status: Skilled Nursing Facility | Payer: Medicare Other | Source: Intra-hospital | Attending: Physical Medicine & Rehabilitation | Admitting: Physical Medicine & Rehabilitation

## 2018-12-07 DIAGNOSIS — R1310 Dysphagia, unspecified: Secondary | ICD-10-CM | POA: Diagnosis not present

## 2018-12-07 DIAGNOSIS — F0390 Unspecified dementia without behavioral disturbance: Secondary | ICD-10-CM | POA: Diagnosis present

## 2018-12-07 DIAGNOSIS — I959 Hypotension, unspecified: Secondary | ICD-10-CM | POA: Diagnosis not present

## 2018-12-07 DIAGNOSIS — Z23 Encounter for immunization: Secondary | ICD-10-CM

## 2018-12-07 DIAGNOSIS — I69391 Dysphagia following cerebral infarction: Secondary | ICD-10-CM

## 2018-12-07 DIAGNOSIS — E876 Hypokalemia: Secondary | ICD-10-CM | POA: Diagnosis present

## 2018-12-07 DIAGNOSIS — R41841 Cognitive communication deficit: Secondary | ICD-10-CM | POA: Diagnosis not present

## 2018-12-07 DIAGNOSIS — R4701 Aphasia: Secondary | ICD-10-CM | POA: Diagnosis not present

## 2018-12-07 DIAGNOSIS — I639 Cerebral infarction, unspecified: Secondary | ICD-10-CM | POA: Diagnosis not present

## 2018-12-07 DIAGNOSIS — R2689 Other abnormalities of gait and mobility: Secondary | ICD-10-CM | POA: Diagnosis not present

## 2018-12-07 DIAGNOSIS — I10 Essential (primary) hypertension: Secondary | ICD-10-CM | POA: Diagnosis present

## 2018-12-07 DIAGNOSIS — R279 Unspecified lack of coordination: Secondary | ICD-10-CM | POA: Diagnosis not present

## 2018-12-07 DIAGNOSIS — I63522 Cerebral infarction due to unspecified occlusion or stenosis of left anterior cerebral artery: Secondary | ICD-10-CM | POA: Diagnosis not present

## 2018-12-07 DIAGNOSIS — Z950 Presence of cardiac pacemaker: Secondary | ICD-10-CM | POA: Diagnosis not present

## 2018-12-07 DIAGNOSIS — I63521 Cerebral infarction due to unspecified occlusion or stenosis of right anterior cerebral artery: Secondary | ICD-10-CM | POA: Diagnosis present

## 2018-12-07 DIAGNOSIS — E039 Hypothyroidism, unspecified: Secondary | ICD-10-CM | POA: Diagnosis present

## 2018-12-07 DIAGNOSIS — Z90710 Acquired absence of both cervix and uterus: Secondary | ICD-10-CM | POA: Diagnosis not present

## 2018-12-07 DIAGNOSIS — K219 Gastro-esophageal reflux disease without esophagitis: Secondary | ICD-10-CM | POA: Diagnosis not present

## 2018-12-07 DIAGNOSIS — I4891 Unspecified atrial fibrillation: Secondary | ICD-10-CM | POA: Diagnosis present

## 2018-12-07 DIAGNOSIS — I63111 Cerebral infarction due to embolism of right vertebral artery: Secondary | ICD-10-CM | POA: Diagnosis not present

## 2018-12-07 DIAGNOSIS — R7302 Impaired glucose tolerance (oral): Secondary | ICD-10-CM | POA: Diagnosis present

## 2018-12-07 DIAGNOSIS — R0902 Hypoxemia: Secondary | ICD-10-CM | POA: Diagnosis not present

## 2018-12-07 DIAGNOSIS — I69319 Unspecified symptoms and signs involving cognitive functions following cerebral infarction: Secondary | ICD-10-CM | POA: Diagnosis not present

## 2018-12-07 DIAGNOSIS — K5900 Constipation, unspecified: Secondary | ICD-10-CM | POA: Diagnosis not present

## 2018-12-07 DIAGNOSIS — Z743 Need for continuous supervision: Secondary | ICD-10-CM | POA: Diagnosis not present

## 2018-12-07 DIAGNOSIS — I69320 Aphasia following cerebral infarction: Principal | ICD-10-CM

## 2018-12-07 DIAGNOSIS — M6281 Muscle weakness (generalized): Secondary | ICD-10-CM | POA: Diagnosis not present

## 2018-12-07 DIAGNOSIS — R05 Cough: Secondary | ICD-10-CM | POA: Diagnosis not present

## 2018-12-07 DIAGNOSIS — E789 Disorder of lipoprotein metabolism, unspecified: Secondary | ICD-10-CM | POA: Diagnosis not present

## 2018-12-07 DIAGNOSIS — I480 Paroxysmal atrial fibrillation: Secondary | ICD-10-CM | POA: Diagnosis not present

## 2018-12-07 DIAGNOSIS — E785 Hyperlipidemia, unspecified: Secondary | ICD-10-CM | POA: Diagnosis present

## 2018-12-07 DIAGNOSIS — I2510 Atherosclerotic heart disease of native coronary artery without angina pectoris: Secondary | ICD-10-CM | POA: Diagnosis present

## 2018-12-07 DIAGNOSIS — D649 Anemia, unspecified: Secondary | ICD-10-CM | POA: Diagnosis not present

## 2018-12-07 DIAGNOSIS — Z8659 Personal history of other mental and behavioral disorders: Secondary | ICD-10-CM | POA: Diagnosis not present

## 2018-12-07 LAB — BASIC METABOLIC PANEL
Anion gap: 8 (ref 5–15)
BUN: 21 mg/dL (ref 8–23)
CO2: 24 mmol/L (ref 22–32)
Calcium: 8.1 mg/dL — ABNORMAL LOW (ref 8.9–10.3)
Chloride: 103 mmol/L (ref 98–111)
Creatinine, Ser: 0.71 mg/dL (ref 0.44–1.00)
GFR calc Af Amer: >60 mL/min (ref >60)
Glucose, Bld: 111 mg/dL — ABNORMAL HIGH (ref 70–99)
Potassium: 3.5 mmol/L (ref 3.5–5.1)
SODIUM: 135 mmol/L (ref 135–145)

## 2018-12-07 LAB — CBC
HCT: 40.7 % (ref 36.0–46.0)
HEMOGLOBIN: 14 g/dL (ref 12.0–15.0)
MCH: 32.1 pg (ref 26.0–34.0)
MCHC: 34.4 g/dL (ref 30.0–36.0)
MCV: 93.3 fL (ref 80.0–100.0)
Platelets: 249 10*3/uL (ref 150–400)
RBC: 4.36 MIL/uL (ref 3.87–5.11)
RDW: 12.6 % (ref 11.5–15.5)
WBC: 8.5 10*3/uL (ref 4.0–10.5)
nRBC: 0 % (ref 0.0–0.2)

## 2018-12-07 MED ORDER — ACETAMINOPHEN 325 MG PO TABS
325.0000 mg | ORAL_TABLET | ORAL | Status: DC | PRN
  Administered 2018-12-09 – 2018-12-20 (×2): 650 mg via ORAL
  Filled 2018-12-07 (×3): qty 2

## 2018-12-07 MED ORDER — POLYETHYLENE GLYCOL 3350 17 G PO PACK
17.0000 g | PACK | Freq: Every day | ORAL | Status: DC | PRN
  Administered 2018-12-08: 17 g via ORAL
  Filled 2018-12-07: qty 1

## 2018-12-07 MED ORDER — AMLODIPINE BESYLATE 10 MG PO TABS
10.0000 mg | ORAL_TABLET | Freq: Every day | ORAL | Status: DC
  Administered 2018-12-08 – 2018-12-26 (×19): 10 mg via ORAL
  Filled 2018-12-07 (×20): qty 1

## 2018-12-07 MED ORDER — ORAL CARE MOUTH RINSE
15.0000 mL | Freq: Two times a day (BID) | OROMUCOSAL | Status: DC
  Administered 2018-12-07 – 2018-12-26 (×36): 15 mL via OROMUCOSAL

## 2018-12-07 MED ORDER — ALUM & MAG HYDROXIDE-SIMETH 200-200-20 MG/5ML PO SUSP: 30.0000 mL | ORAL | Status: DC | PRN

## 2018-12-07 MED ORDER — GUAIFENESIN-DM 100-10 MG/5ML PO SYRP: 5.0000 mL | ORAL_SOLUTION | Freq: Four times a day (QID) | ORAL | Status: DC | PRN

## 2018-12-07 MED ORDER — ASPIRIN EC 81 MG PO TBEC
81.0000 mg | DELAYED_RELEASE_TABLET | Freq: Every day | ORAL | Status: DC
  Administered 2018-12-08 – 2018-12-10 (×3): 81 mg via ORAL
  Filled 2018-12-07 (×3): qty 1

## 2018-12-07 MED ORDER — ENSURE ENLIVE PO LIQD: 237.0000 mL | Freq: Two times a day (BID) | ORAL | Status: DC

## 2018-12-07 MED ORDER — DONEPEZIL HCL 10 MG PO TABS
10.0000 mg | ORAL_TABLET | Freq: Every day | ORAL | Status: DC
  Administered 2018-12-08 – 2018-12-26 (×19): 10 mg via ORAL
  Filled 2018-12-07 (×20): qty 1

## 2018-12-07 MED ORDER — PROCHLORPERAZINE MALEATE 5 MG PO TABS: 5.0000 mg | ORAL_TABLET | Freq: Four times a day (QID) | ORAL | Status: DC | PRN

## 2018-12-07 MED ORDER — FLEET ENEMA 7-19 GM/118ML RE ENEM: 1.0000 | ENEMA | Freq: Once | RECTAL | Status: DC | PRN

## 2018-12-07 MED ORDER — LOSARTAN POTASSIUM 50 MG PO TABS
100.0000 mg | ORAL_TABLET | Freq: Every day | ORAL | Status: DC
  Administered 2018-12-08 – 2018-12-26 (×19): 100 mg via ORAL
  Filled 2018-12-07 (×20): qty 2

## 2018-12-07 MED ORDER — ENOXAPARIN SODIUM 40 MG/0.4ML ~~LOC~~ SOLN
40.0000 mg | SUBCUTANEOUS | Status: DC
  Administered 2018-12-08 – 2018-12-25 (×18): 40 mg via SUBCUTANEOUS
  Filled 2018-12-07 (×18): qty 0.4

## 2018-12-07 MED ORDER — PROCHLORPERAZINE EDISYLATE 10 MG/2ML IJ SOLN: 5.0000 mg | Freq: Four times a day (QID) | INTRAMUSCULAR | Status: DC | PRN

## 2018-12-07 MED ORDER — PRAVASTATIN SODIUM 40 MG PO TABS
40.0000 mg | ORAL_TABLET | Freq: Every day | ORAL | Status: DC
  Administered 2018-12-07 – 2018-12-25 (×19): 40 mg via ORAL
  Filled 2018-12-07 (×19): qty 1

## 2018-12-07 MED ORDER — PANTOPRAZOLE SODIUM 40 MG PO TBEC
40.0000 mg | DELAYED_RELEASE_TABLET | Freq: Every day | ORAL | Status: DC
  Administered 2018-12-07 – 2018-12-10 (×4): 40 mg via ORAL
  Filled 2018-12-07 (×4): qty 1

## 2018-12-07 MED ORDER — DIPHENHYDRAMINE HCL 12.5 MG/5ML PO ELIX: 12.5000 mg | ORAL_SOLUTION | Freq: Four times a day (QID) | ORAL | Status: DC | PRN

## 2018-12-07 MED ORDER — ADULT MULTIVITAMIN W/MINERALS CH
1.0000 | ORAL_TABLET | Freq: Every morning | ORAL | Status: DC
  Administered 2018-12-08 – 2018-12-26 (×19): 1 via ORAL
  Filled 2018-12-07 (×17): qty 1

## 2018-12-07 MED ORDER — LEVOTHYROXINE SODIUM 25 MCG PO TABS
125.0000 ug | ORAL_TABLET | Freq: Every day | ORAL | Status: DC
  Administered 2018-12-08 – 2018-12-26 (×19): 125 ug via ORAL
  Filled 2018-12-07 (×19): qty 1

## 2018-12-07 MED ORDER — TRAZODONE HCL 50 MG PO TABS
25.0000 mg | ORAL_TABLET | Freq: Every evening | ORAL | Status: DC | PRN
  Administered 2018-12-13 – 2018-12-23 (×6): 50 mg via ORAL
  Filled 2018-12-07 (×6): qty 1

## 2018-12-07 MED ORDER — BISACODYL 10 MG RE SUPP
10.0000 mg | Freq: Every day | RECTAL | Status: DC | PRN
  Administered 2018-12-09: 10 mg via RECTAL
  Filled 2018-12-07: qty 1

## 2018-12-07 MED ORDER — PROCHLORPERAZINE 25 MG RE SUPP: 12.5000 mg | Freq: Four times a day (QID) | RECTAL | Status: DC | PRN

## 2018-12-07 NOTE — Progress Notes (Signed)
[name] Staggers, MD  Physician  Physical Medicine and Rehabilitation  PMR Pre-admission  Signed  Date of Service:  12/04/2018 6:23 PM       Related encounter: ED to Hosp-Admission (Discharged) from 12/03/2018 in [place] [facility]      Signed        [facility] PMR Admission Coordinator Pre-Admission Assessment  Patient: [name] is an 85 y.o., female MRN: [id number] DOB: 09/03/1922 Height: _0  (165.1 cm) Weight: 69.6 kg  Insurance Information HMO:     PPO:      PCP:      IPA:      80/20: Yes     OTHER:  PRIMARY: Medicare A and B      Policy#: [id number]      Subscriber: Patient CM Name:       Phone#:      Fax#:  Pre-Cert#:       Employer:  Benefits:  Phone #: NA     Name: verified eligibility via [place] on 12/04/18 Eff. Date: Part A: 08/26/89; Part B: 08/26/89     Deduct: $1,408      Out of Pocket Max: NA      Life Max: NA CIR: Covered per Medicare Guidelines once yearly deductible is met.      SNF: days 1-20, 100%, days 21-100, 80%  Outpatient: 80%     Co-Pay: 20% Home Health: 100%      Co-Pay:  DME: 80%     Co-Pay: 20% Providers: Pt's choice SECONDARY: BCBS      Policy#: [id number]      Subscriber: Patient CM Name:       Phone#:      Fax#:  Pre-Cert#:       Employer:  Benefits:  Phone #: [phone]     Name:  Eff. Date:      Deduct:       Out of Pocket Max:       Life Max:  CIR:       SNF:  Outpatient:      Co-Pay:  Home Health:      Co-Pay:  DME:     Co-Pay:   Medicaid Application Date:       Case Manager:  Disability Application Date:       Case Worker:   Emergency Contact Information         Contact Information    Name Relation Home Work Mobile   [place] Relative   [phone]   Kamariah, Fruchter   [phone]      Current Medical History  Patient Admitting Diagnosis: LEFT frontal/ACA territory infarct History of Present Illness: Pt is a 85 yo F with history of stroke and prior aphasia  (still able to have conversation per family), CHF, CAD, dementia, pacemaker placement, and urinary incontinence (see PMH for more details). Pt presented from ILF at [facility] with right sided weakness and aphasia. Pt received IV TPA on Monday 12/03/18. CT showed acute nonhomorrhagic Left frontal/ACA territory infarct with multiple old infarcts. Repeat CT showed an evolving acute Left frontal/ACA territory infarct with new hemorrhagic conversion. Pt had 2D echo with EF 55%. Pt currently on no antithrombotic given hermorrhagic conversion. DOAC to be discussed after follow up CT in one week. Hospital course also notable for respiratory distress with sleep apnea pattern and a CXR on 3/11 showing improved infiltration or atelectasis in the left lung base. Pt is afebrile and WBCs 8.5. Pt has worked well with PT,  OT, and SLP. Pt currently on a DYS 1 diet with thin liquids. Therapies are recommending CIR. Pt is to be admitted to CIR on 12/07/18.   Patient's medical [facility] from [facility] has been reviewed by the rehabilitation admission coordinator and physician.   Past Medical History      Past Medical History:  Diagnosis Date  . Aphasia 01/09/2009  . Arthritis    right hand  . 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 CEREBROVASCULAR ACCIDENT, HX OF 11/20/2007  . CHF (congestive heart failure) ([place])   . Coronary artery disease   . CVA 01/09/2009  . DEMENTIA 05/19/2008  . Dementia ([place])   . DIVERTICULOSIS, COLON 11/20/2007  . FATIGUE 11/20/2007  . Femoral fracture ([place]) 07/01/2013  . GLUCOSE INTOLERANCE 12/06/2010  . HYPERLIPIDEMIA 11/20/2007  . HYPERTENSION 11/20/2007   Echo was done 07/07/11: mod LVH, EF 55-60%, grade 1 diast dysfxn, mild MR, mild LAE, mod TR, PASP 39  . HYPOTHYROIDISM 11/20/2007  . Impaired glucose tolerance 06/03/2011  . IRON DEFICIENCY 12/06/2010  . Pacemaker 2002  . PACEMAKER, PERMANENT 01/09/2009  . TIA 05/14/2003   elevated CE's in setting of TIA and falls in 10/12  - echo normal with no further cardiac w/u pursued  . TIA (transient ischemic attack) 10/10/2011  . Urinary incontinence 01/09/2009    Family History   family history includes Cancer in her mother.  Prior Rehab/Hospitalizations Has the patient had major surgery during 100 days prior to admission? No               Current Medications  Current Facility-Administered Medications:  .  acetaminophen (TYLENOL) tablet 650 mg, 650 mg, Oral, Q4H PRN **OR** acetaminophen (TYLENOL) solution 650 mg, 650 mg, Per Tube, Q4H PRN **OR** acetaminophen (TYLENOL) suppository 650 mg, 650 mg, Rectal, Q4H PRN, Greta Doom, MD .  amLODipine (NORVASC) tablet 10 mg, 10 mg, Oral, Daily, Rosalin Hawking, MD, 10 mg at 12/07/18 0920 .  aspirin EC tablet 81 mg, 81 mg, Oral, Daily, Rosalin Hawking, MD, 81 mg at 12/07/18 0920 .  donepezil (ARICEPT) tablet 10 mg, 10 mg, Oral, Daily, Greta Doom, MD, 10 mg at 12/07/18 0920 .  enoxaparin (LOVENOX) injection 40 mg, 40 mg, Subcutaneous, Q24H, Rosalin Hawking, MD, 40 mg at 12/07/18 1230 .  levothyroxine (SYNTHROID, LEVOTHROID) tablet 125 mcg, 125 mcg, Oral, Q0600, Greta Doom, MD, 125 mcg at 12/07/18 [phone] .  losartan (COZAAR) tablet 100 mg, 100 mg, Oral, Daily, Rosalin Hawking, MD, 100 mg at 12/07/18 0920 .  MEDLINE mouth rinse, 15 mL, Mouth Rinse, BID, Rush Farmer, MD, 15 mL at 12/07/18 2831 .  multivitamin with minerals tablet 1 tablet, 1 tablet, Oral, q morning - 10a, Rosalin Hawking, MD, 1 tablet at 12/07/18 0920 .  pantoprazole (PROTONIX) EC tablet 40 mg, 40 mg, Oral, QHS, Rosalin Hawking, MD, 40 mg at 12/06/18 2152 .  pravastatin (PRAVACHOL) tablet 40 mg, 40 mg, Oral, QHS, Greta Doom, MD, 40 mg at 12/06/18 2152 .  sodium chloride flush (NS) 0.9 % injection 3 mL, 3 mL, Intravenous, Q12H, Donne Hazel, MD, 3 mL at 12/07/18 0920  Patients Current Diet:      Diet Order                  DIET - DYS 1 Room service appropriate? Yes; Fluid  consistency: Thin  Diet effective now               Precautions / Restrictions  Precautions Precautions: Fall Precaution Comments: at risk for skin breakdown   Has the patient had 2 or more falls or a fall with injury in the past year?Yes, 1 with injury in October where she broke toes due to fire alarm drill at ILF.   Prior Activity Level Limited Community (1-2x/wk): Modified Independent with ambulation around ILF home and outside ILF grounds; mostly stayed in [place] facility. did ambulate outside room to go to dining hall 3x/day  Prior Functional Level Do you want Prior Function Level of Independence: Needs assistance Gait / Transfers Assistance Needed: walks with a rollator her "buggy" ADL's / Homemaking Assistance Needed: assist from aids  Communication / Swallowing Assistance Needed: HOH from other? Self Care: Did the patient need help bathing, dressing, using the toilet or eating?  Needed some help for bathing and dressing (Min A); Independent with toileting and feeding.   Indoor Mobility: Did the patient need assistance with walking from room to room (with or without device)? Independent  Stairs: Did the patient need assistance with internal or external stairs (with or without device)? Needed some help  Functional Cognition: Did the patient need help planning regular tasks such as shopping or remembering to take medications? Needed some help  Home Assistive Devices / Equipment Home Equipment: Walker - 4 wheels  Prior Device Use: Indicate devices/aids used by the patient prior to current illness, exacerbation or injury? Walker  Prior Functional Level   Prior Functional Level Current Functional Level  Bed Mobility Independent Mod A x 2  Transfers Modified Independent  Mod Ax 2  Mobility - Walk/Wheelchair Modified Independent Mod A x2 pivotal steps  Mobility - Ambulation/Gait Modified Independent Mod A x2 pivotal steps  Upper Body Dressing Supervision Max A   Lower Body Dressing Min A Max A  Grooming Modified Independent Mod A in sitting  Eating/Drinking Modified Independent Mod A  Toilet Transfer Modified Independent Mod A x2  Bladder Continence Incontinent, uses incontinent products at baseline Incontinent; total A  Bowel Management  Incontinent, uses incontinent products at baseline Incontinent; total A  Stair Climbing Min A/Mod A (per family description) NT  Communication Some aphasia  HOH, Expressive difficulties  Memory Unknown  Not formally assessed  Cooking/Meal Prep  Can do simple meal prep in home (cereal)      Housework  NA   Money Management  Unknown   Driving  Dependent     Special needs/care consideration BiPAP/CPAP: no CPM: no Continuous Drip IV: No Dialysis: no        Days: NA Life Vest: no Oxygen: no, RA Special Bed: no Trach Size: no Wound Vac (area): no      Location: no Skin: MASD to perineum                              Bowel mgmt: incontinent, last BM: 12/05/18 Bladder mgmt:incontinenet Diabetic mgmt: no  Previous Home Environment Living Arrangements: Alone Available Help at Discharge: Personal care attendant, Available 24 hours/day Type of Home: Independent living facility Home Layout: One level Home Access: Level entry Bathroom Shower/Tub: Multimedia programmer: Handicapped height Bathroom Accessibility: Yes How Accessible: Accessible via walker  Discharge Living Setting Plans for Discharge Living Setting: Patient's home, Apartment, Other (Comment)(ILF apartment at [facility]) Type of Home at Discharge: [place] Name at Discharge: [place] with increased hired help  Discharge Home Layout: One level Discharge Home Access: Level entry Discharge Bathroom Shower/Tub: Other (  comment)(tub shower cut out for slight ledge to enter) Discharge Bathroom Toilet: Handicapped height(with handles on elevated commode) Discharge Bathroom  Accessibility: Yes How Accessible: Accessible via walker Does the patient have any problems obtaining your medications?: No  Social/Family/Support Systems Patient Roles: Other (Comment)(has supportive son and daughter in law) Contact Information: son: Juanda Crumble Charlotte Sanes): [phone]; Wells Guiles (daughter in law): [phone] Anticipated Caregiver: hired assist; son and daughter willing to hire assist through [place]; already receives some services at baseline Anticipated Caregiver's Contact Information: see above Ability/Limitations of Caregiver: Min A Caregiver Availability: 24/7 Discharge Plan Discussed with Primary Caregiver: Yes(son and daughter in Sports coach) Is Caregiver In Agreement with Plan?: Yes Does Caregiver/Family have Issues with Lodging/Transportation while Pt is in Rehab?: No  Goals/Additional Needs Patient/Family Goal for Rehab: PT: Supervision; OT: Min A; SLP: Supervision Expected length of stay: 12-15 days Cultural Considerations: [employer] Dietary Needs: DYS 1, thin liquids Equipment Needs: TBD Additional Information: has some baseline aphasia per family (But much worse now); has hx of dementia but was very funcitonal PTA Pt/Family Agrees to Admission and willing to participate: Yes Program Orientation Provided & Reviewed with Pt/Caregiver Including Roles  & Responsibilities: Yes(son and daugther in law)  Barriers to Discharge: Incontinence, Other (comments)  Barriers to Discharge Comments: will need 24/7 hired assist-to be determined closer to DC.   Patient Condition: I have reviewed medical records from [facility], spoken with RN, [place], and the patient, and her son and daughter in law. I met with patient at the bedside and gathered all necessary information required for an inpatient rehabilitation assessment.  Patient will benefit from ongoing PT, OT, and SLP, can actively participate in 3 hours of therapy a day 5 days of the week, and can make  measurable gains during the admission.  Patient will also benefit from the coordinated team approach during an Inpatient Acute Rehabilitation admission.  The patient will receive intensive therapy as well as Rehabilitation physician, nursing, social worker, and care management interventions.  Due to bowel management, bladder management, safety, skin/wound care, disease management, medical administration, pain management, patient education the patient requires 24 hour a day rehabilitation nursing.  The patient is currently Mod A x2 with mobility and Max A to mod A x2 for basic ADLs.  Discharge setting and therapy post discharge at ILF (home) with [facility] and 24/7 A is anticipated.  Patient has agreed to participate in the Acute Inpatient Rehabilitation Program and will admit today, 12/07/18.  Preadmission Screen Completed By:  Jhonnie Garner, 12/07/2018 2:31 PM ______________________________________________________________________   Discussed status with Dr. Naaman Plummer on 12/07/18 at 2:17PM and received telephone approval for admission today.  Admission Coordinator:  Jhonnie Garner, time 2:17PM/Date 12/07/18.   Assessment/Plan: Diagnosis: left ACA infarct 1. Does the need for close, 24 hr/day  Medical supervision in concert with the patient's rehab needs make it unreasonable for this patient to be served in a less intensive setting? Yes 2. Co-Morbidities requiring supervision/potential complications: CHF, CAD, dementia 3. Due to bladder management, bowel management, safety, skin/wound care, disease management, medication administration, pain management and patient education, does the patient require 24 hr/day rehab nursing? Yes 4. Does the patient require coordinated care of a physician, rehab nurse, PT (1-2 hrs/day, 5 days/week), OT (1-2 hrs/day, 5 days/week) and SLP (1-2 hrs/day, 5 days/week) to address physical and functional deficits in the context of the above medical diagnosis(es)? Yes Addressing deficits in the  following areas: balance, endurance, locomotion, strength, transferring, bowel/bladder control, bathing, dressing, feeding, grooming, toileting, cognition,  speech, language, swallowing and psychosocial support 5. Can the patient actively participate in an intensive therapy program of at least 3 hrs of therapy 5 days a week? Yes 6. The potential for patient to make measurable gains while on inpatient rehab is excellent 7. Anticipated functional outcomes upon discharge from inpatients are: supervision PT, min assist OT, supervision SLP 8. Estimated rehab length of stay to reach the above functional goals is: 12-15 days 9. Anticipated D/C setting: Home 10. Anticipated post D/C treatments: [place] therapy 11. Overall Rehab/Functional Prognosis: excellent    RECOMMENDATIONS: This patient's condition is appropriate for continued rehabilitative care in the following setting: CIR Patient has agreed to participate in recommended program. Yes Note that insurance prior authorization may be required for reimbursement for recommended care.  Comment: [name] Staggers, MD, [facility] 12/07/2018   Jhonnie Garner 12/07/2018        Revision History    Date/Time User Provider Type Action  12/07/2018 3:40 PM [name] Staggers, MD Physician Sign  12/07/2018 2:31 PM Jhonnie Garner, [place] Rehab Admission Coordinator Share  View Details Report

## 2018-12-07 NOTE — Progress Notes (Signed)
Physical Therapy Treatment Patient Details Name: [name] MRN: [id number] DOB: 1922/07/13 Today's Date: 12/07/2018    History of Present Illness 85 y.o. female admitted on 12/02/18 for R sided weakness and aphasia.  Pt give IV tPA.  Pt dx with L frontal/ACA territory infarct likely due to A-fib.  Pt with other significant PMH of TIA, pacemaker, HTN, femoral fx s/p L hip arthroplasty, dementia, CAD, CHF, arthritis (particularly in her hands), s/p cerebral aneurysm s/p coiling.      PT Comments    Pt was participative and followed simple commands with increased time.  Emphasis on transitions to EOB, sitting balance at EOB x >10 min, sit to stand and standing balance with/without RW, Pre gait and transfer to chair.    Follow Up Recommendations  CIR;Supervision/Assistance - 24 hour     Equipment Recommendations  None recommended by PT    Recommendations for Other Services       Precautions / Restrictions Precautions Precautions: Fall Precaution Comments: at risk for skin breakdown    Mobility  Bed Mobility Overal bed mobility: Needs Assistance Bed Mobility: Sit to Supine Rolling: Max assist   Supine to sit: +2 for physical assistance;Mod assist     General bed mobility comments: pt requires (A) to shift LB to EOB and power up into static sitting  Transfers Overall transfer level: Needs assistance Equipment used: Rolling walker (2 wheeled) Transfers: Sit to/from [facility] Sit to Stand: Max assist Stand pivot transfers: +2 safety/equipment;Mod assist       General transfer comment: facilitating steps, with (A) to weight shift  Ambulation/Gait             General Gait Details: pivotal steps to chair with moderate assist of 2 and RW   Stairs             Wheelchair Mobility    Modified Rankin (Stroke Patients Only) Modified Rankin (Stroke Patients Only) Pre-Morbid Rankin Score: Moderate disability Modified Rankin: Moderately  severe disability     Balance Overall balance assessment: Needs assistance Sitting-balance support: Feet supported;Bilateral upper extremity supported Sitting balance-[name] Scale: Poor Sitting balance - Comments: pt initially pushing at EOB and requires incr time and lateral leans to progress to static sitting min (A)    Standing balance support: Bilateral upper extremity supported;During functional activity Standing balance-[name] Scale: Poor Standing balance comment: pt able to static stand in RW with bil UE on RW                            Cognition Arousal/Alertness: Awake/alert Behavior During Therapy: Flat affect Overall Cognitive Status: Difficult to assess Area of Impairment: Attention;Awareness;Safety/judgement;Memory                 Orientation Level: Disoriented to;Time Current Attention Level: Sustained   Following Commands: Follows one step commands with increased time Safety/Judgement: Decreased awareness of safety;Decreased awareness of deficits Awareness: Intellectual Problem Solving: Slow processing;Decreased initiation;Difficulty sequencing General Comments: pt laughing during session and able to express a few words during session. pt unaware of incontinence      Exercises Other Exercises Other Exercises: warm up hip/knee AAROM for stiff/arthritic joints brior to mobility x10 reps    General Comments General comments (skin integrity, edema, etc.): vss      Pertinent Vitals/Pain Pain Assessment: Faces Faces Pain Scale: Hurts little more Pain Location: all over Pain Descriptors / Indicators: Discomfort;Grimacing Pain Intervention(s): Monitored during session  Home Living                      Prior Function            PT Goals (current goals can now be found in the care plan section) Acute Rehab PT Goals Patient Stated Goal: none stated  PT Goal Formulation: With patient Time For Goal Achievement: 12/17/18 Potential to  Achieve Goals: Fair Progress towards PT goals: Progressing toward goals    Frequency    Min 4X/week      PT Plan Current plan remains appropriate    Co-evaluation PT/OT/SLP Co-Evaluation/Treatment: Yes Reason for Co-Treatment: Complexity of the patient's impairments (multi-system involvement) PT goals addressed during session: Mobility/safety with mobility OT goals addressed during session: ADL's and self-care      AM-PAC PT "6 Clicks" Mobility   Outcome Measure  Help needed turning from your back to your side while in a flat bed without using bedrails?: Total Help needed moving from lying on your back to sitting on the side of a flat bed without using bedrails?: Total Help needed moving to and from a bed to a chair (including a wheelchair)?: Total Help needed standing up from a chair using your arms (e.g., wheelchair or bedside chair)?: Total Help needed to walk in hospital room?: Total Help needed climbing 3-5 steps with a railing? : Total 6 Click Score: 6    End of Session Equipment Utilized During Treatment: Gait belt Activity Tolerance: Patient tolerated treatment well Patient left: in chair;with call bell/phone within reach;with chair alarm set;Other (comment)(on lift pad) Nurse Communication: Mobility status PT Visit Diagnosis: Muscle weakness (generalized) (M62.81);Difficulty in walking, not elsewhere classified (R26.2);Other symptoms and signs involving the nervous system (R29.898);Hemiplegia and hemiparesis Hemiplegia - Right/Left: Right Hemiplegia - dominant/non-dominant: Dominant Hemiplegia - caused by: Cerebral infarction     Time: 1030-1101 PT Time Calculation (min) (ACUTE ONLY): 31 min  Charges:  $Therapeutic Activity: 8-22 mins                     12/07/2018  Donnella Sham, PT Acute Rehabilitation Services [phone]  (pager) [phone]  (office)   [name] 12/07/2018, 1:52 PM

## 2018-12-07 NOTE — Progress Notes (Signed)
Inpatient Rehabilitation-Admissions Coordinator   [facility] received medical approval for admit to CIR today. AC has updated pt and her family. AC has notified RN, CM, and SW of plan.   Please call if questions.   Jhonnie Garner, OTR/L  Rehab Admissions Coordinator  [phone] 12/07/2018 2:32 PM

## 2018-12-07 NOTE — Care Management Important Message (Signed)
Important Message  Patient Details  Name: [name] MRN: [id number] Date of Birth: [birth year]   Medicare Important Message Given:  Yes    Teneisha Gignac 12/07/2018, 3:01 PM

## 2018-12-07 NOTE — Discharge Summary (Addendum)
Patient ID: [name]   MRN: [id number]      DOB: 08/31/1922  Date of Admission: 12/03/2018 Date of Discharge: 12/07/2018  Attending Physician:  Rosalin Hawking, MD, Stroke MD Consultant(s):  Treatment Team:  Stroke, Md, MD None Patient's PCP:  Biagio Borg, MD  DISCHARGE DIAGNOSIS: LEFT frontal/ACA territory infarct, likely due to Afib Active Problems:   Stroke (cerebrum) [facility])  Past Medical History:  Diagnosis Date  . Aphasia 01/09/2009  . Arthritis    right hand  . 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 CEREBROVASCULAR ACCIDENT, HX OF 11/20/2007  . CHF (congestive heart failure) ([place])   . Coronary artery disease   . CVA 01/09/2009  . DEMENTIA 05/19/2008  . Dementia ([place])   . DIVERTICULOSIS, COLON 11/20/2007  . FATIGUE 11/20/2007  . Femoral fracture ([place]) 07/01/2013  . GLUCOSE INTOLERANCE 12/06/2010  . HYPERLIPIDEMIA 11/20/2007  . HYPERTENSION 11/20/2007   Echo was done 07/07/11: mod LVH, EF 55-60%, grade 1 diast dysfxn, mild MR, mild LAE, mod TR, PASP 39  . HYPOTHYROIDISM 11/20/2007  . Impaired glucose tolerance 06/03/2011  . IRON DEFICIENCY 12/06/2010  . Pacemaker 2002  . PACEMAKER, PERMANENT 01/09/2009  . TIA 05/14/2003   elevated CE's in setting of TIA and falls in 10/12 - echo normal with no further cardiac w/u pursued  . TIA (transient ischemic attack) 10/10/2011  . Urinary incontinence 01/09/2009   Past Surgical History:  Procedure Laterality Date  . ABDOMINAL HYSTERECTOMY    . CHOLECYSTECTOMY    . CYSTOCELE REPAIR N/A 11/26/2012   Procedure: Cystocele Repair with Lefort Colpocleisis.  colpectomy Levatorplasty Perineorraphy;  Surgeon: Lovenia Kim, MD;  Location: [facility];  Service: Gynecology;  Laterality: N/A;  Cystocele Repair with Lefort Colpocleisis.  Marland Kitchen HIP ARTHROPLASTY Left 07/02/2013   Procedure: ARTHROPLASTY BIPOLAR HIP;  Surgeon: Mauri Pole, MD;  Location: [facility];  Service: Orthopedics;  Laterality: Left;  . INGUINAL HERNIA REPAIR Right 08/13/2013   Procedure:  HERNIA REPAIR INGUINAL INCARCERATED;  Surgeon: Ralene Ok, MD;  Location: [facility];  Service: General;  Laterality: Right;  . Medtronic Cynthia dual chamber pacemaker serial number was [id number] H...04/15/2009    . s/p cerebral aneurysm  1991  . s/p pacemaker DDD    . s/p retinal attachment    . s/p thyroidectomy      HOSPITAL MEDICATIONS . amLODipine  10 mg Oral Daily  . aspirin EC  81 mg Oral Daily  . donepezil  10 mg Oral Daily  . enoxaparin (LOVENOX) injection  40 mg Subcutaneous Q24H  . levothyroxine  125 mcg Oral Q0600  . losartan  100 mg Oral Daily  . mouth rinse  15 mL Mouth Rinse BID  . multivitamin with minerals  1 tablet Oral q morning - 10a  . pantoprazole  40 mg Oral QHS  . pravastatin  40 mg Oral QHS  . sodium chloride flush  3 mL Intravenous Q12H    HOME MEDICATIONS PRIOR TO ADMISSION Medications Prior to Admission  Medication Sig Dispense Refill  . amLODipine (NORVASC) 10 MG tablet TAKE 1 TABLET BY MOUTH DAILY. RESUME IN 3 DAYS IS SYSTOLIC BLOOD PRESSURE IS GREATER THAN 150. (Patient taking differently: Take 10 mg by mouth daily. ) 90 tablet 0  . Calcium Carbonate-Vitamin D (CALTRATE 600+D) 600-400 MG-UNIT per tablet Take 1 tablet by mouth at bedtime.     . clopidogrel (PLAVIX) 75 MG tablet TAKE 1 TABLET(75 MG) BY MOUTH DAILY (Patient taking differently: Take 75 mg  by mouth daily. ) 90 tablet 0  . Docusate Sodium (DSS) 100 MG CAPS Take 100 mg by mouth every morning.     . donepezil (ARICEPT) 10 MG tablet TAKE 1 TABLET(10 MG) BY MOUTH DAILY (Patient taking differently: Take 10 mg by mouth daily. ) 90 tablet 0  . feeding supplement, ENSURE COMPLETE, (ENSURE COMPLETE) LIQD Take 237 mLs by mouth 2 (two) times daily between meals.    Marland Kitchen levothyroxine (SYNTHROID, LEVOTHROID) 125 MCG tablet TAKE 1 TABLET(125 MCG) BY MOUTH DAILY (Patient taking differently: Take 125 mcg by mouth daily before breakfast. ) 90 tablet 0  . losartan (COZAAR) 100 MG tablet TAKE 1 TABLET(100 MG) BY  MOUTH DAILY (Patient taking differently: Take 100 mg by mouth daily. ) 90 tablet 0  . Multiple Vitamin (MULTIVITAMIN WITH MINERALS) TABS Take 1 tablet by mouth every morning. Centrum silver    . polyethylene glycol (MIRALAX / GLYCOLAX) packet Take 17 g by mouth daily as needed for mild constipation.    . polyvinyl alcohol (ARTIFICIAL TEARS) 1.4 % ophthalmic solution Place 1 drop into both eyes 4 (four) times daily.     . pravastatin (PRAVACHOL) 40 MG tablet TAKE 1 TABLET(40 MG) BY MOUTH DAILY (Patient taking differently: Take 40 mg by mouth at bedtime. ) 90 tablet 0  . ALPRAZolam (XANAX) 0.25 MG tablet TAKE 1/2 -1 TABLET BY MOUTH 2 TIMES DAILY AS NEEDED (Patient not taking: Reported on 11/06/2018) 40 tablet 1    LABORATORY STUDIES CBC    Component Value Date/Time   WBC 8.5 12/07/2018 0527   RBC 4.36 12/07/2018 0527   HGB 14.0 12/07/2018 0527   HCT 40.7 12/07/2018 0527   PLT 249 12/07/2018 0527   MCV 93.3 12/07/2018 0527   MCH 32.1 12/07/2018 0527   MCHC 34.4 12/07/2018 0527   RDW 12.6 12/07/2018 0527   LYMPHSABS 0.8 12/03/2018 0118   MONOABS 0.7 12/03/2018 0118   EOSABS 0.0 12/03/2018 0118   BASOSABS 0.0 12/03/2018 0118   CMP    Component Value Date/Time   NA 135 12/07/2018 0527   K 3.5 12/07/2018 0527   CL 103 12/07/2018 0527   CO2 24 12/07/2018 0527   GLUCOSE 111 (H) 12/07/2018 0527   BUN 21 12/07/2018 0527   CREATININE 0.71 12/07/2018 0527   CALCIUM 8.1 (L) 12/07/2018 0527   PROT 6.4 (L) 12/03/2018 0118   ALBUMIN 3.5 12/03/2018 0118   AST 24 12/03/2018 0118   ALT 21 12/03/2018 0118   ALKPHOS 58 12/03/2018 0118   BILITOT 0.7 12/03/2018 0118   GFRNONAA >60 12/07/2018 0527   GFRAA >60 12/07/2018 0527   COAGS Lab Results  Component Value Date   INR 1.1 12/03/2018   INR 1.06 07/06/2011   INR 1.0 04/15/2009   Lipid Panel    Component Value Date/Time   CHOL 101 12/03/2018 0320   TRIG 66 12/03/2018 0320   HDL 40 (L) 12/03/2018 0320   CHOLHDL 2.5 12/03/2018 0320    VLDL 13 12/03/2018 0320   LDLCALC 48 12/03/2018 0320   HgbA1C  Lab Results  Component Value Date   HGBA1C 5.6 12/03/2018   Urinalysis    Component Value Date/Time   COLORURINE YELLOW 12/04/2018 1258   APPEARANCEUR HAZY (A) 12/04/2018 1258   LABSPEC 1.008 12/04/2018 1258   PHURINE 6.0 12/04/2018 1258   GLUCOSEU NEGATIVE 12/04/2018 1258   GLUCOSEU NEGATIVE 06/21/2018 1202   HGBUR MODERATE (A) 12/04/2018 1258   BILIRUBINUR NEGATIVE 12/04/2018 [place] 12/04/2018 1258  PROTEINUR NEGATIVE 12/04/2018 1258   UROBILINOGEN 0.2 06/21/2018 1202   NITRITE NEGATIVE 12/04/2018 1258   LEUKOCYTESUR SMALL (A) 12/04/2018 1258   Urine Drug Screen No results found for: LABOPIA, COCAINSCRNUR, LABBENZ, AMPHETMU, THCU, LABBARB  Alcohol Level    Component Value Date/Time   ETH <10 12/03/2018 0119     SIGNIFICANT DIAGNOSTIC STUDIES  Ct Angio Head W Or Wo Contrast Ct Angio Neck W Or Wo Contrast Ct Cerebral Perfusion W Contrast 12/03/2018 IMPRESSION:   CTA NECK:  1. No hemodynamically significant stenosis ICA's.  2. Patent vertebral arteries.  3. An incidental finding of potential clinical significance has been found. 3.9 cm ascending aorta. Recommend annual imaging followup by CTA or MRA. This recommendation follows 2010 ACCF/AHA/AATS/ACR/ASA/SCA/SCAI/SIR/STS/SVM Guidelines for the Diagnosis and Management of Patients with Thoracic Aortic Disease. Circulation.2010; 121: B262-M355. Aortic aneurysm NOS (ICD10-I71.9)**   CTA HEAD:  1. Emergent LEFT A2 occlusion, poor collateralization by single-phase CTA.  2. Severe LEFT and moderate RIGHT supraclinoid ICA stenosis.  3. Occluded LEFT MCA at aneurysm clip, intermediate collaterals by single-phase   CTA. CT PERFUSION:  1. Rapid data processing failed.    Ct Head Code Stroke Wo Contrast 12/03/2018  IMPRESSION:  1. Acute nonhemorrhagic LEFT frontal/ACA territory infarct.  2. Old large LEFT MCA territory infarct, status  post clipping of LEFT MCA aneurysm.  3. Moderate to severe chronic small vessel ischemic changes. Old small basal ganglia, thalami and cerebellar infarcts.  4. ASPECTS is 10.   PORTABLE CHEST 1 VIEW 12/05/2018 (COMPARISON:  12/04/2018) IMPRESSION: Improving infiltration or atelectasis in the left lung base.    Transthoracic Echocardiogram -   1. The left ventricle has a visually estimated ejection fraction of of 55%. The cavity size was normal. There is moderately increased left ventricular wall thickness. Left ventricular diastolic Doppler parameters are indeterminate. No evidence of left              ventricular regional wall motion abnormalities. Septal-lateral dyssynchrony consistent with RV pacing.             2. The right ventricle has normal systolic function. The cavity was   normal. There is no increase in right ventricular wall thickness.             3. Left atrial size was severely dilated.             4. Right atrial size was mildly dilated.             5. The mitral valve is degenerative. Moderate calcification of the     mitral valve leaflet. There is moderate to severe mitral annular   calcification present. Mild mitral regurgitation. There is no more than             mild mitral stenosis present, mean gradient 4              mmHg.             6. Tricuspid valve regurgitation is moderate.             7. The aortic valve is tricuspid Moderate calcification of the aortic   valve. Aortic valve regurgitation is moderate by color flow Doppler. no    stenosis of the aortic valve.             8. The ascending aorta is normal in size and structure.             9. There is mild dilatation of the aortic  root measuring 37 mm.             10. The inferior vena cava was dilated in size with <50% respiratory variability. PA systolic pressure 45 mmHg.             11. Trivial pericardial effusion is present.     HISTORY OF PRESENT ILLNESS (From Dr Cecil Cobbs H&P on  12/03/2018) [name] is a 85 y.o. female with a history of stroke, aneurysm treated with clipping in 1993 as well as previous ocular stroke in 2005 who presents with right-sided weakness and aphasia.  She apparently had some mild baseline aphasia, but was able to have a conversation, and was talking in her normal way when her nursing tech helped her change into pajamas around 9:45 PM.  She left the room just after 10 PM.  Another member the staff checked on her and found her to have right-sided weakness and aphasia around 1 AM.  When she initially arrived to the hospital, the last known well was 2:30 PM when the son had left her.  It was known that someone had helped her to bed, but not who and it was not known if she was normal at that time but they helped her to bed.  Shortly after at 2 AM the nursing home called back stating that they had located the person who helped her go to bed and I was able to speak with that person who stated that she was in her normal state of health, with normal speech at that time.  LKW: 10 PM tpa given?:  Yes Modified Rankin Scale: 3-Moderate disability-requires help but walks WITHOUT assistance   HOSPITAL COURSE Ms. KAENA SANTORI is a 85 y.o. female with history of a permanent pacemaker for complete AV block, previous strokes, cerebral aneurysm clipping in 1993, hypothyroidism, hypertension, hyperlipidemia, dementia, glucose intolerance, coronary artery disease, and congestive heart failure presenting with right-sided weakness and aphasia.   She received IV TPA on Monday, 12/03/2018 at 0215.  Stroke: LEFT frontal/ACA territory infarct, likely due to Afib not on [facility]  Resultant  right hemiparesis, paucity of speech  CT head - Acute nonhemorrhagic LEFT frontal/ACA territory infarct.  Multiple old infarcts.  MRI head - PPM  CTA H&N - Emergent LEFT A2 occlusion. Severe LEFT and moderate RIGHT supraclinoid ICA stenosis. Occluded LEFT MCA at aneurysm  clip with intermediate collaterals. Right ICA bulb athero.   CT repeat evolving acute LEFT frontal/ACA territory infarct with new hemorrhagic conversion.   2D Echo - EF 55%.   LDL - 48  HgbA1c - 5.6  VTE prophylaxis -Lovenox  Diet - NPO  clopidogrel 75 mg daily prior to admission, now on ASA 81 daily given hemorrhagic conversion. Recommend to repeat CT head on 12/11/18 and then decide on DOAC based on CT findings with Dr. Leonie Man.   Patient counseled to be compliant with her antithrombotic medications  Ongoing aggressive stroke risk factor management  Therapy recommendations:  CIR  Disposition:  CIR today  Afib not on [facility]  Pacer has been detecting AF for long time  Deemed not AC candidate by cardiology due to hx of cerebral aneurysm   Since aneurysm has been secured with clipping, pt may be candidate for [facility] given multiple hx of strokes.  Pt is previously Dr. Clydene Fake pt and will let him aware  Had messages with Dr. Lovena Le who is OK with pt to be on DOAC if needed. Given her hemorrhagic conversion at left ACA, now  on ASA 81 mg daily. Recommend to have a f/u CT in 1 week (12/11/18). Dr. Leonie Man will review the CT at that time and decide on the antithrombotic regimen.    Hx of stroke  Left MCA stroke due to aneurysm clipping in [place] stroke in 2005  06/2011 admitted for left facial and left sided weakness, CT neg, CTA h/n b/l siphon 50-70% stenosis, right MCA distal branch occlusion   Several TIAs over the years  Respiratory distress with sleep apnea pattern  Mild respiratory distress with sleep apnea pattern  Will d/c IVF  Stay post Lasix  CXR 12/04/2018 left lower lobe atelectasis versus consolidation  Repeat CXR 12/05/2018 - Improving infiltration or atelectasis in the left lung base. (afebrile) (WBCs 8.5)  Hypertension  Stable within the goal  BP goal < 140 given hemorrhagic conversion  Resume home amlodipine and losartan  Long-term BP goal  normotensive  Dementia   Baseline dementia  Some baseline aphasia as per son  Lives in independent living but with assistance from staff  On acricept  Hyperlipidemia  Lipid lowering medication PTA: Pravachol 40 mg daily  LDL 48, goal < 70  Resume Pravachol 40 mg daily   Continue statin at discharge  Dysphagia   Passed swallow   On dysphagia 1 and thin liquid  Speech on board  Other Stroke Risk Factors  Advanced age  Coronary artery disease  CHF  Other Active Problems  3.9 cm ascending aorta.  Annual imaging recommended. (see above)  Hypokalemia 3.1 - supplement ->3.6->2.9 supplemented->4.1->3.5  AVB s/p pacer    DISCHARGE EXAM Blood pressure (!) 154/61, pulse (!) 59, temperature 97.9 F (36.6 C), temperature source Oral, resp. rate 18, height 5\' 5"  (1.651 m), weight 69.6 kg, SpO2 96 %.  General - Well nourished, well developed,   Lungs: CTAB  Cardiovascular - pacer rhythm  Neuro - awake, alert, more interactive, sitting in chair, feeding herself with right hand. Able to state her name and age on questioning, orientated to people but disorientated to time or place, mild dysarthria. Paucity of speech, but able to name 1/2, able to repeat simple sentences. PERRL, blinking to visual threat bilaterally. No forced gaze, able to attend to both sides. Mild right nasolabial fold flattening. Tongue midline in mouth. LUE 4/5, RUE 4/5, LLE 3+/5, RLE 3/5 proximal and distal. DTR 1+ and babinski positive on the right. Sensation symmetrical, coordination not corporative and gait not tested.   Discharge Diet   Diet Order            DIET - DYS 1 Room service appropriate? Yes; Fluid consistency: Thin  Diet effective now             liquids  DISCHARGE PLAN  Disposition:  Transfer to [facility] for ongoing PT, OT and ST  aspirin 81 mg daily for secondary stroke prevention. (see further recommendations above)  Recommend ongoing risk  factor control by Primary Care Physician at time of discharge from inpatient rehabilitation.  Follow-up Biagio Borg, MD in 2 weeks following discharge from rehab. F/U Ascending Aorta.  Follow-up in [facility] in 4 weeks following discharge from rehab, office to schedule an appointment. F/U Head CT in 1 Week (12/11/18) to be read by Dr Leonie Man.  35 minutes were spent preparing discharge.  Rosalin Hawking, MD PhD Stroke Neurology 12/07/2018 4:43 PM

## 2018-12-07 NOTE — Progress Notes (Signed)
Occupational Therapy Treatment Patient Details Name: [name] MRN: [id number] DOB: 05-02-1922 Today's Date: 12/07/2018    History of present illness 85 y.o. female admitted on 12/02/18 for R sided weakness and aphasia.  Pt give IV tPA.  Pt dx with L frontal/ACA territory infarct likely due to A-fib.  Pt with other significant PMH of TIA, pacemaker, HTN, femoral fx s/p L hip arthroplasty, dementia, CAD, CHF, arthritis (particularly in her hands), s/p cerebral aneurysm s/p coiling.     OT comments  Pt pleasant and agreeable to session. Pt able to complete a few steps total +2 Mod / Max (A) to advance steps. Pt with incontinence and lack of awareness during session limiting progression of stepping toward chair. Pt remains appropriate for CIR at this time.   Follow Up Recommendations  Supervision/Assistance - 24 hour;CIR    Equipment Recommendations  3 in 1 bedside commode    Recommendations for Other Services Rehab consult    Precautions / Restrictions Precautions Precautions: Fall Precaution Comments: at risk for skin breakdown       Mobility Bed Mobility Overal bed mobility: Needs Assistance Bed Mobility: Sit to Supine Rolling: Max assist   Supine to sit: +2 for physical assistance;Mod assist     General bed mobility comments: pt requires (A) to shift LB to EOB and power up into static sitting  Transfers Overall transfer level: Needs assistance Equipment used: Rolling walker (2 wheeled) Transfers: Sit to/from [facility] Sit to Stand: Max assist Stand pivot transfers: +2 safety/equipment;Mod assist       General transfer comment: facilitating steps, with (A) to weight shift    Balance Overall balance assessment: Needs assistance Sitting-balance support: Feet supported;Bilateral upper extremity supported Sitting balance-[name] Scale: Poor Sitting balance - Comments: pt initially pushing at EOB and requires incr time and lateral leans to  progress to static sitting min (A)    Standing balance support: Bilateral upper extremity supported;During functional activity Standing balance-[name] Scale: Poor Standing balance comment: pt able to static stand in RW with bil UE on RW                           ADL either performed or assessed with clinical judgement   ADL Overall ADL's : Needs assistance/impaired             Lower Body Bathing: Maximal assistance Lower Body Bathing Details (indicate cue type and reason): pt with incontinence of bowels and lack of awareness. pt able to static stand for peri hygiene.      Lower Body Dressing: Maximal assistance   Toilet Transfer: +2 for physical assistance;Moderate assistance Toilet Transfer Details (indicate cue type and reason): pt needs (A) To rotate hips to chair         Functional mobility during ADLs: +2 for physical assistance;Moderate assistance General ADL Comments: pt is able to take several steps this session adn then must terminate task due to incontinnce     Vision       Perception     Praxis      Cognition Arousal/Alertness: Awake/alert Behavior During Therapy: Flat affect Overall Cognitive Status: Difficult to assess Area of Impairment: Attention;Awareness;Safety/judgement;Memory                 Orientation Level: Disoriented to;Time Current Attention Level: Sustained   Following Commands: Follows one step commands with increased time Safety/Judgement: Decreased awareness of safety;Decreased awareness of deficits Awareness: Intellectual Problem Solving: Slow processing;Decreased  initiation;Difficulty sequencing General Comments: pt laughing during session and able to express a few words during session. pt unaware of incontinence        Exercises     Shoulder Instructions       General Comments      Pertinent Vitals/ Pain       Pain Assessment: Faces Faces Pain Scale: Hurts little more Pain Location: all over Pain  Descriptors / Indicators: Discomfort;Grimacing Pain Intervention(s): Monitored during session;Repositioned  Home Living                                          Prior Functioning/Environment              Frequency  Min 2X/week        Progress Toward Goals  OT Goals(current goals can now be found in the care plan section)  Progress towards OT goals: Progressing toward goals  Acute Rehab OT Goals Patient Stated Goal: none stated  OT Goal Formulation: With patient Time For Goal Achievement: 12/19/18 Potential to Achieve Goals: Good ADL Goals Pt Will Perform Eating: with set-up;with supervision;sitting;with adaptive utensils Pt Will Perform Grooming: with supervision;with set-up;sitting Pt Will Perform Upper Body Bathing: with min assist;sitting Pt Will Perform Lower Body Bathing: with min assist;sitting/lateral leans Pt Will Transfer to Toilet: with min assist;bedside commode;stand pivot transfer  Plan Discharge plan remains appropriate    Co-evaluation    PT/OT/SLP Co-Evaluation/Treatment: Yes Reason for Co-Treatment: Complexity of the patient's impairments (multi-system involvement);Necessary to address cognition/behavior during functional activity;For patient/therapist safety;To address functional/ADL transfers   OT goals addressed during session: ADL's and self-care;Proper use of Adaptive equipment and DME      AM-PAC OT "6 Clicks" Daily Activity     Outcome Measure   Help from another person eating meals?: A Lot Help from another person taking care of personal grooming?: A Lot Help from another person toileting, which includes using toliet, bedpan, or urinal?: A Lot Help from another person bathing (including washing, rinsing, drying)?: A Lot Help from another person to put on and taking off regular upper body clothing?: A Lot Help from another person to put on and taking off regular lower body clothing?: A Lot 6 Click Score: 12    End of  Session Equipment Utilized During Treatment: Gait belt;Rolling walker  OT Visit Diagnosis: Other abnormalities of gait and mobility (R26.89);Muscle weakness (generalized) (M62.81);Other symptoms and signs involving cognitive function;Cognitive communication deficit (R41.841);Pain Symptoms and signs involving cognitive functions: Cerebral infarction   Activity Tolerance Patient tolerated treatment well   Patient Left in chair;with call bell/phone within reach;with chair alarm set   Nurse Communication Mobility status;Precautions        Time: 1030-1101 OT Time Calculation (min): 31 min  Charges: OT General Charges $OT Visit: 1 Visit OT Treatments $Self Care/Home Management : 8-22 mins   Jeri Modena, OTR/L  Acute Rehabilitation Services Pager: [phone] Office: [phone] .    Jeri Modena 12/07/2018, 1:32 PM

## 2018-12-07 NOTE — H&P (Signed)
Physical Medicine and Rehabilitation Admission H&P      Chief Complaint  Patient presents with   Stroke with functional decline.       HPI: [name] is a 85 year old  RH-female with history of stroke with mild aphasia, aneurysm clipping 1993, ocular stroke 2005, AV block s/p PPM, mild dementia who was admitted on 12/03/2018 after being found with right-sided weakness and aphasia.  CTA head neck showed emergent left A2 occlusion with poor collateralization and severe left and moderate right supraclinoid ICA stenosis, occluded left MCA at aneurysm clip with intermediate collaterals and 3.9 cm ascending aortic aneurysm..  She received IV tPA and Dr. Erlinda Hong felt that stroke embolic due to A. Fib. Follow up CT head showed evolving acute left frontal/ACA territory infarct with new petechial hemorrhage and old large L-MCA territory infarct. She was cleared to start ASA yesterday with recommendations to repeat CT in about a week and decide on DOAC based on CT findings.    2D echo done showing EF 55% with moderate increase left ventricular wall thickness, severely dilated left atrium moderate TVR, moderate calcification of mitral valve and aortic valve. She did have issues with mild respiratory distress due to fluid overload therefore IVF discontinued. ST exam revealed mixed dysfluent aphasia with slow speech and perseveratory motor responses She was placed on dysphagia 1, thins liquids. She is able to follow simple commands with increased time and working of pregait activity. CIR recommended due to functional decline.      Review of Systems  Unable to perform ROS: Language  Musculoskeletal: Positive for myalgias.       Pain "all over"           Past Medical History:  Diagnosis Date   Aphasia 01/09/2009   Arthritis      right hand   AV BLOCK, COMPLETE 01/09/2009   CEREBROVASCULAR ACCIDENT, HX OF 11/20/2007   CHF (congestive heart failure) (HCC)     Coronary artery disease      CVA 01/09/2009   DEMENTIA 05/19/2008   Dementia ([place])     DIVERTICULOSIS, COLON 11/20/2007   FATIGUE 11/20/2007   Femoral fracture ([place]) 07/01/2013   GLUCOSE INTOLERANCE 12/06/2010   HYPERLIPIDEMIA 11/20/2007   HYPERTENSION 11/20/2007    Echo was done 07/07/11: mod LVH, EF 55-60%, grade 1 diast dysfxn, mild MR, mild LAE, mod TR, PASP 39   HYPOTHYROIDISM 11/20/2007   Impaired glucose tolerance 06/03/2011   IRON DEFICIENCY 12/06/2010   Pacemaker 2002   PACEMAKER, PERMANENT 01/09/2009   TIA 05/14/2003    elevated CE's in setting of TIA and falls in 10/12 - echo normal with no further cardiac w/u pursued   TIA (transient ischemic attack) 10/10/2011   Urinary incontinence 01/09/2009           Past Surgical History:  Procedure Laterality Date   ABDOMINAL HYSTERECTOMY       CHOLECYSTECTOMY       CYSTOCELE REPAIR N/A 11/26/2012    Procedure: Cystocele Repair with Lefort Colpocleisis.  colpectomy Levatorplasty Perineorraphy;  Surgeon: Lovenia Kim, MD;  Location: [facility];  Service: Gynecology;  Laterality: N/A;  Cystocele Repair with Lefort Colpocleisis.   HIP ARTHROPLASTY Left 07/02/2013    Procedure: ARTHROPLASTY BIPOLAR HIP;  Surgeon: Mauri Pole, MD;  Location: [facility];  Service: Orthopedics;  Laterality: Left;   INGUINAL HERNIA REPAIR Right 08/13/2013    Procedure: HERNIA REPAIR INGUINAL INCARCERATED;  Surgeon: Ralene Ok, MD;  Location: [facility];  Service: General;  Laterality: Right;   Medtronic Cynthia dual chamber pacemaker serial number was [id number] H...04/15/2009       s/p cerebral aneurysm   1991   s/p pacemaker DDD       s/p retinal attachment       s/p thyroidectomy               Family History  Problem Relation Age of Onset   Cancer Mother          breast cancer      Social History: Widowed. Retired bookkeeper? Had personal care givers for ADLs but was independent for ambulation. Per  reports that she has never smoked. She has never used smokeless  tobacco. She reports that she does not drink alcohol or use drugs.           Allergies  Allergen Reactions   Codeine        unknown            Medications Prior to Admission  Medication Sig Dispense Refill   amLODipine (NORVASC) 10 MG tablet TAKE 1 TABLET BY MOUTH DAILY. RESUME IN 3 DAYS IS SYSTOLIC BLOOD PRESSURE IS GREATER THAN 150. (Patient taking differently: Take 10 mg by mouth daily. ) 90 tablet 0   Calcium Carbonate-Vitamin D (CALTRATE 600+D) 600-400 MG-UNIT per tablet Take 1 tablet by mouth at bedtime.        clopidogrel (PLAVIX) 75 MG tablet TAKE 1 TABLET(75 MG) BY MOUTH DAILY (Patient taking differently: Take 75 mg by mouth daily. ) 90 tablet 0   Docusate Sodium (DSS) 100 MG CAPS Take 100 mg by mouth every morning.        donepezil (ARICEPT) 10 MG tablet TAKE 1 TABLET(10 MG) BY MOUTH DAILY (Patient taking differently: Take 10 mg by mouth daily. ) 90 tablet 0   feeding supplement, ENSURE COMPLETE, (ENSURE COMPLETE) LIQD Take 237 mLs by mouth 2 (two) times daily between meals.       levothyroxine (SYNTHROID, LEVOTHROID) 125 MCG tablet TAKE 1 TABLET(125 MCG) BY MOUTH DAILY (Patient taking differently: Take 125 mcg by mouth daily before breakfast. ) 90 tablet 0   losartan (COZAAR) 100 MG tablet TAKE 1 TABLET(100 MG) BY MOUTH DAILY (Patient taking differently: Take 100 mg by mouth daily. ) 90 tablet 0   Multiple Vitamin (MULTIVITAMIN WITH MINERALS) TABS Take 1 tablet by mouth every morning. Centrum silver       polyethylene glycol (MIRALAX / GLYCOLAX) packet Take 17 g by mouth daily as needed for mild constipation.       polyvinyl alcohol (ARTIFICIAL TEARS) 1.4 % ophthalmic solution Place 1 drop into both eyes 4 (four) times daily.        pravastatin (PRAVACHOL) 40 MG tablet TAKE 1 TABLET(40 MG) BY MOUTH DAILY (Patient taking differently: Take 40 mg by mouth at bedtime. ) 90 tablet 0   ALPRAZolam (XANAX) 0.25 MG tablet TAKE 1/2 -1 TABLET BY MOUTH 2 TIMES DAILY AS NEEDED  (Patient not taking: Reported on 11/06/2018) 40 tablet 1      Drug Regimen Review  Drug regimen was reviewed and remains appropriate with no significant issues identified   Home: Home Living Family/patient expects to be discharged to:: Private residence Living Arrangements: Alone Available Help at Discharge: Personal care attendant, Available 24 hours/day Type of Home: Independent living facility([facility]) Home Access: Level entry Home Layout: One level Bathroom Shower/Tub: Multimedia programmer: Handicapped height Home Equipment: Environmental consultant - 4 wheels   Functional History: Prior  Function Level of Independence: Needs assistance Gait / Transfers Assistance Needed: walks with a rollator her "buggy" ADL's / Homemaking Assistance Needed: assist from aids  Communication / Swallowing Assistance Needed: HOH   Functional Status:  Mobility: Bed Mobility Overal bed mobility: Needs Assistance Bed Mobility: Supine to Sit, Sit to Supine Supine to sit: Max assist, HOB elevated Sit to supine: Max assist, HOB elevated General bed mobility comments: Max assist to help initiate movement of legs to EOB and support trunk and hips to come to EOB.  HOB elevated to assist with supporting trunk during transitions.  Pt assisting a little with both transitions at her trunk and using her left arm/elbow.     ADL:   Cognition: Cognition Overall Cognitive Status: Impaired/Different from baseline Arousal/Alertness: Awake/alert Orientation Level: Oriented to person Attention: Focused Focused Attention: Appears intact Awareness: Impaired Cognition Arousal/Alertness: Awake/alert Behavior During Therapy: Flat affect Overall Cognitive Status: Impaired/Different from baseline Area of Impairment: Orientation, Attention, Memory, Following commands, Safety/judgement, Awareness, Problem solving Orientation Level: Time, Situation(knows her son and hospital) Current Attention Level: Focused Following  Commands: Follows one step commands inconsistently, Follows one step commands with increased time Safety/Judgement: Decreased awareness of deficits Awareness: Intellectual Problem Solving: Slow processing, Decreased initiation, Difficulty sequencing, Requires verbal cues, Requires tactile cues General Comments: Pt is very slow to process, needing 15-20 seconds of extra time, she is HOH also which doesn't increase her understanding of commands.  She inconsistantly will follow commands and usually more so on the left side, despite spontaneous movement of the right side.      Blood pressure (!) 161/51, pulse 60, temperature 98.3 F (36.8 C), temperature source Oral, resp. rate 18, height 5\' 5"  (1.651 m), weight 69.6 kg, SpO2 93 %. Physical Exam  Nursing note and vitals reviewed. Constitutional: She appears well-developed and well-nourished.  Frail appearing  HENT:  Head: Normocephalic and atraumatic.  Eyes: Pupils are equal, round, and reactive to light. EOM are normal.  Neck: Normal range of motion. No tracheal deviation present. No thyromegaly present.  Cardiovascular: Normal rate and regular rhythm. Exam reveals no friction rub.  No murmur heard. Respiratory: Effort normal. No respiratory distress. She has no wheezes.  GI: Soft. She exhibits no distension. There is no abdominal tenderness.  Neurological: She is alert.  Expressive greater than receptive aphasia. Followed most simple commands although usually with delay. Expressed verbally in one word, two word answers on occasion. Apraxic. RUE and RLE 4-/5. LUE and LLE 4 to 4+/5. Senses pain in all 4s. ?right field cut    Skin: Skin is warm.  Psychiatric:  Flat, cooperative      Lab Results Last 48 Hours  Results for orders placed or performed during the hospital encounter of 12/03/18 (from the past 48 hour(s))  CBG monitoring, ED     Status: Abnormal    Collection Time: 12/03/18  1:06 AM  Result Value Ref Range    Glucose-Capillary  134 (H) 70 - 99 mg/dL  I-stat Creatinine, ED     Status: Abnormal    Collection Time: 12/03/18  1:12 AM  Result Value Ref Range    Creatinine, Ser 1.10 (H) 0.44 - 1.00 mg/dL  Protime-INR     Status: None    Collection Time: 12/03/18  1:18 AM  Result Value Ref Range    Prothrombin Time 13.7 11.4 - 15.2 seconds    INR 1.1 0.8 - 1.2      Comment: (NOTE) INR goal varies based on device and disease  states. Performed at [facility], [place] [address], [place], [place] [address]    APTT     Status: None    Collection Time: 12/03/18  1:18 AM  Result Value Ref Range    aPTT 35 24 - 36 seconds      Comment: Performed at [facility] [address], [place], [place] [address]  CBC     Status: None    Collection Time: 12/03/18  1:18 AM  Result Value Ref Range    WBC 5.8 4.0 - 10.5 K/uL    RBC 4.10 3.87 - 5.11 MIL/uL    Hemoglobin 12.9 12.0 - 15.0 g/dL    HCT 39.5 36.0 - 46.0 %    MCV 96.3 80.0 - 100.0 fL    MCH 31.5 26.0 - 34.0 pg    MCHC 32.7 30.0 - 36.0 g/dL    RDW 12.8 11.5 - 15.5 %    Platelets 224 150 - 400 K/uL    nRBC 0.0 0.0 - 0.2 %      Comment: Performed at [facility], [place] [address], [place], [place] [address]  Differential     Status: None    Collection Time: 12/03/18  1:18 AM  Result Value Ref Range    Neutrophils Relative % 72 %    Neutro Abs 4.3 1.7 - 7.7 K/uL    Lymphocytes Relative 13 %    Lymphs Abs 0.8 0.7 - 4.0 K/uL    Monocytes Relative 13 %    Monocytes Absolute 0.7 0.1 - 1.0 K/uL    Eosinophils Relative 1 %    Eosinophils Absolute 0.0 0.0 - 0.5 K/uL    Basophils Relative 0 %    Basophils Absolute 0.0 0.0 - 0.1 K/uL    Immature Granulocytes 1 %    Abs Immature Granulocytes 0.03 0.00 - 0.07 K/uL      Comment: Performed at [facility] [address], [place], [place] [address]  Comprehensive metabolic panel     Status: Abnormal    Collection Time: 12/03/18  1:18 AM  Result Value Ref Range    Sodium 136 135 - 145 mmol/L     Potassium 3.1 (L) 3.5 - 5.1 mmol/L    Chloride 101 98 - 111 mmol/L    CO2 23 22 - 32 mmol/L    Glucose, Bld 151 (H) 70 - 99 mg/dL    BUN 35 (H) 8 - 23 mg/dL    Creatinine, Ser 1.12 (H) 0.44 - 1.00 mg/dL    Calcium 8.3 (L) 8.9 - 10.3 mg/dL    Total Protein 6.4 (L) 6.5 - 8.1 g/dL    Albumin 3.5 3.5 - 5.0 g/dL    AST 24 15 - 41 U/L    ALT 21 0 - 44 U/L    Alkaline Phosphatase 58 38 - 126 U/L    Total Bilirubin 0.7 0.3 - 1.2 mg/dL    GFR calc non Af Amer 42 (L) >60 mL/min    GFR calc Af Amer 49 (L) >60 mL/min    Anion gap 12 5 - 15      Comment: Performed at [facility], [place] [address], [place], [place] [address]  Ethanol     Status: None    Collection Time: 12/03/18  1:19 AM  Result Value Ref Range    Alcohol, Ethyl (B) <10 <10 mg/dL      Comment: (NOTE) Lowest detectable limit for serum alcohol is  10 mg/dL. For medical purposes only. Performed at [facility], [place] [address] [place], [place] [address]    CBC     Status: None    Collection Time: 12/03/18  3:20 AM  Result Value Ref Range    WBC 6.0 4.0 - 10.5 K/uL    RBC 3.96 3.87 - 5.11 MIL/uL    Hemoglobin 12.4 12.0 - 15.0 g/dL    HCT 38.0 36.0 - 46.0 %    MCV 96.0 80.0 - 100.0 fL    MCH 31.3 26.0 - 34.0 pg    MCHC 32.6 30.0 - 36.0 g/dL    RDW 12.8 11.5 - 15.5 %    Platelets 220 150 - 400 K/uL    nRBC 0.0 0.0 - 0.2 %      Comment: Performed at [facility], [place] [address], [place], [place] [address]  Hemoglobin A1c     Status: None    Collection Time: 12/03/18  3:20 AM  Result Value Ref Range    Hgb A1c MFr Bld 5.6 4.8 - 5.6 %      Comment: (NOTE) Pre diabetes:          5.7%-6.4% Diabetes:              >6.4% Glycemic control for   <7.0% adults with diabetes      Mean Plasma Glucose 114.02 mg/dL      Comment: Performed at [facility] [address], [place], [place] [address]  Lipid panel     Status: Abnormal    Collection Time: 12/03/18  3:20 AM  Result Value Ref Range    Cholesterol  101 0 - 200 mg/dL    Triglycerides 66 <150 mg/dL    HDL 40 (L) >40 mg/dL    Total CHOL/HDL Ratio 2.5 RATIO    VLDL 13 0 - 40 mg/dL    LDL Cholesterol 48 0 - 99 mg/dL      Comment:        Total Cholesterol/HDL:CHD Risk Coronary Heart Disease Risk Table                     Men   Women  1/2 Average Risk   3.4   3.3  Average Risk       5.0   4.4  2 X Average Risk   9.6   7.1  3 X Average Risk  23.4   11.0        Use the calculated Patient Ratio above and the CHD Risk Table to determine the patient's CHD Risk.        ATP III CLASSIFICATION (LDL):  <100     mg/dL   Optimal  100-129  mg/dL   Near or Above                    Optimal  130-159  mg/dL   Borderline  160-189  mg/dL   High  >190     mg/dL   Very High Performed at [facility] [address], [place], [place] [address]    MRSA PCR Screening     Status: None    Collection Time: 12/03/18  4:00 AM  Result Value Ref Range    MRSA by PCR NEGATIVE NEGATIVE      Comment:        The GeneXpert MRSA Assay (FDA approved for NASAL specimens only), is one component of a comprehensive MRSA colonization  surveillance program. It is not intended to diagnose MRSA infection nor to guide or monitor treatment for MRSA infections. Performed at [facility], [place] [address] [place], [place] [address]    Basic metabolic panel     Status: Abnormal    Collection Time: 12/04/18  3:53 AM  Result Value Ref Range    Sodium 135 135 - 145 mmol/L    Potassium 3.6 3.5 - 5.1 mmol/L    Chloride 107 98 - 111 mmol/L    CO2 19 (L) 22 - 32 mmol/L    Glucose, Bld 116 (H) 70 - 99 mg/dL    BUN 19 8 - 23 mg/dL    Creatinine, Ser 0.89 0.44 - 1.00 mg/dL    Calcium 8.0 (L) 8.9 - 10.3 mg/dL    GFR calc non Af Amer 55 (L) >60 mL/min    GFR calc Af Amer >60 >60 mL/min    Anion gap 9 5 - 15      Comment: Performed at [facility] [address], [place], [place] [address]  CBC     Status: None    Collection Time: 12/04/18  3:53 AM    Result Value Ref Range    WBC 7.5 4.0 - 10.5 K/uL    RBC 4.00 3.87 - 5.11 MIL/uL    Hemoglobin 12.9 12.0 - 15.0 g/dL    HCT 37.9 36.0 - 46.0 %    MCV 94.8 80.0 - 100.0 fL    MCH 32.3 26.0 - 34.0 pg    MCHC 34.0 30.0 - 36.0 g/dL    RDW 12.8 11.5 - 15.5 %    Platelets 226 150 - 400 K/uL    nRBC 0.0 0.0 - 0.2 %      Comment: Performed at [facility], [place] [address], [place], [place] [address]  Glucose, capillary     Status: Abnormal    Collection Time: 12/04/18  3:53 AM  Result Value Ref Range    Glucose-Capillary 113 (H) 70 - 99 mg/dL  Glucose, capillary     Status: Abnormal    Collection Time: 12/04/18  7:44 AM  Result Value Ref Range    Glucose-Capillary 105 (H) 70 - 99 mg/dL  Glucose, capillary     Status: Abnormal    Collection Time: 12/04/18 12:33 PM  Result Value Ref Range    Glucose-Capillary 130 (H) 70 - 99 mg/dL       Imaging Results (Last 48 hours)  Ct Angio Head W Or Wo Contrast   Result Date: 12/03/2018 CLINICAL DATA:  Facial droop.  Nonverbal. EXAM: CT ANGIOGRAPHY HEAD AND NECK CT PERFUSION BRAIN TECHNIQUE: Multidetector CT imaging of the head and neck was performed using the standard protocol during bolus administration of intravenous contrast. Multiplanar CT image reconstructions and MIPs were obtained to evaluate the vascular anatomy. Carotid stenosis measurements (when applicable) are obtained utilizing NASCET criteria, using the distal internal carotid diameter as the denominator. Multiphase CT imaging of the brain was performed following IV bolus contrast injection. Subsequent parametric perfusion maps were calculated using RAPID software. CONTRAST:  123mL OMNIPAQUE IOHEXOL 350 MG/ML SOLN, 59mL ISOVUE-370 IOPAMIDOL (ISOVUE-370) INJECTION 76%; 148mL OMNIPAQUE IOHEXOL 350 MG/ML SOLN COMPARISON:  CT HEAD December 03, 2018 FINDINGS: CTA NECK FINDINGS: AORTIC ARCH: 3.9 cm ascending aorta. Moderate calcific atherosclerosis. The origins of the innominate, left Common  carotid artery and subclavian artery are patent. RIGHT CAROTID SYSTEM: Common carotid artery is patent, trace calcific atherosclerosis. Moderate calcific atherosclerosis  of the carotid bifurcation without hemodynamically significant stenosis by NASCET criteria. Normal appearance of the internal carotid artery. LEFT CAROTID SYSTEM: Common carotid artery is patent. Mild calcific atherosclerosis of the carotid bifurcation without hemodynamically significant stenosis by NASCET criteria. Normal appearance of the internal carotid artery. VERTEBRAL ARTERIES:Left vertebral artery is dominant. Normal appearance of the vertebral arteries, widely patent. SKELETON: No acute osseous process though bone windows have not been submitted. OTHER NECK: Soft tissues of the neck are nonacute though, not tailored for evaluation. UPPER CHEST: Mosaic lung attenuation seen with small airway disease and/or pulmonary edema. Narrowed tracheobronchial tree, possible malacia. Streak artifact from LEFT cardiac pacemaker battery pack. CTA HEAD FINDINGS: ANTERIOR CIRCULATION: Patent cervical internal carotid arteries, petrous, cavernous and supra clinoid internal carotid arteries. Severe LEFT and moderate RIGHT stenosis supraclinoid ICA's. Patent anterior communicating artery. Gradual loss of LEFT A2 contrast opacification, poor collateralization. Patent RIGHT ACA. Irregular LEFT MCA, occluded at aneurysm clip with intermediate collaterals. Patent RIGHT middle cerebral artery, severe stenosis RIGHT M2 segments. No contrast extravasation or aneurysm. POSTERIOR CIRCULATION: Patent vertebral arteries, vertebrobasilar junction and basilar artery, as well as main branch vessels. Patent posterior cerebral arteries, moderate luminal irregularity compatible with atherosclerosis. Small LEFT posterior communicating artery. No large vessel occlusion, flow-limiting stenosis, contrast extravasation or aneurysm. VENOUS SINUSES: Major dural venous sinuses are  patent though not tailored for evaluation on this angiographic examination. ANATOMIC VARIANTS: None. DELAYED PHASE: Not performed. MIP images reviewed. CT Brain Perfusion Findings: Rapid data processing failed. IMPRESSION: CTA NECK: 1. No hemodynamically significant stenosis ICA's. 2. Patent vertebral arteries. 3. **An incidental finding of potential clinical significance has been found. 3.9 cm ascending aorta. Recommend annual imaging followup by CTA or MRA. This recommendation follows 2010 ACCF/AHA/AATS/ACR/ASA/SCA/SCAI/SIR/STS/SVM Guidelines for the Diagnosis and Management of Patients with Thoracic Aortic Disease. Circulation.2010; 121: W237-S283. Aortic aneurysm NOS (ICD10-I71.9)** CTA HEAD: 1. Emergent LEFT A2 occlusion, poor collateralization by single-phase CTA. 2. Severe LEFT and moderate RIGHT supraclinoid ICA stenosis. 3. Occluded LEFT MCA at aneurysm clip, intermediate collaterals by single-phase CTA. CT PERFUSION: 1. Rapid data processing failed. Critical Value/emergent results text paged to Dr.MCNEILL [facility] via AMION secure system on 12/03/2018 at 1:35 am, including interpreting physician's phone number. Perfusion failure discussed with and reconfirmed by Dr.MCNEILL [facility] on 12/03/2018 at 2:20 am. Electronically Signed   By: Elon Alas M.D.   On: 12/03/2018 02:24    Ct Head Wo Contrast   Result Date: 12/04/2018 CLINICAL DATA:  Follow up stroke. History of stroke, cerebral aneurysm, dementia. EXAM: CT HEAD WITHOUT CONTRAST TECHNIQUE: Contiguous axial images were obtained from the base of the skull through the vertex without intravenous contrast. COMPARISON:  None. FINDINGS: BRAIN: Increasing conspicuity of LEFT mesial frontal loss of gray-white matter differentiation, with petechial hemorrhage. Regional mass effect without midline shift. LEFT frontotemporal parietal encephalomalacia, ex vacuo dilatation LEFT lateral ventricle. No hydrocephalus. Old basal ganglia, thalami and  cerebellar infarcts. Confluent supratentorial white matter hypodensities. No abnormal extra-axial fluid collections. Basal cisterns are patent. VASCULAR: Moderate to severe calcific atherosclerosis of the carotid siphons. LEFT MCA bifurcation aneurysm clipping. SKULL: No skull fracture. Severe temporomandibular osteoarthrosis. Old LEFT craniotomy. No significant scalp soft tissue swelling. SINUSES/ORBITS: Trace paranasal sinus mucosal thickening. Mastoid air cells are well aerated.The included ocular globes and orbital contents are non-suspicious. Status post bilateral ocular lens implants. OTHER: None. IMPRESSION: 1. Evolving acute LEFT frontal/ACA territory infarct with new petechial hemorrhage. 2. Old large LEFT MCA territory infarct, status post clipping of LEFT MCA aneurysm. 3. Moderate  to severe chronic small vessel ischemic changes and multiple old supra and infratentorial infarcts. Electronically Signed   By: Elon Alas M.D.   On: 12/04/2018 00:32    Ct Angio Neck W Or Wo Contrast   Result Date: 12/03/2018 CLINICAL DATA:  Facial droop.  Nonverbal. EXAM: CT ANGIOGRAPHY HEAD AND NECK CT PERFUSION BRAIN TECHNIQUE: Multidetector CT imaging of the head and neck was performed using the standard protocol during bolus administration of intravenous contrast. Multiplanar CT image reconstructions and MIPs were obtained to evaluate the vascular anatomy. Carotid stenosis measurements (when applicable) are obtained utilizing NASCET criteria, using the distal internal carotid diameter as the denominator. Multiphase CT imaging of the brain was performed following IV bolus contrast injection. Subsequent parametric perfusion maps were calculated using RAPID software. CONTRAST:  160mL OMNIPAQUE IOHEXOL 350 MG/ML SOLN, 64mL ISOVUE-370 IOPAMIDOL (ISOVUE-370) INJECTION 76%; 137mL OMNIPAQUE IOHEXOL 350 MG/ML SOLN COMPARISON:  CT HEAD December 03, 2018 FINDINGS: CTA NECK FINDINGS: AORTIC ARCH: 3.9 cm ascending aorta. Moderate  calcific atherosclerosis. The origins of the innominate, left Common carotid artery and subclavian artery are patent. RIGHT CAROTID SYSTEM: Common carotid artery is patent, trace calcific atherosclerosis. Moderate calcific atherosclerosis of the carotid bifurcation without hemodynamically significant stenosis by NASCET criteria. Normal appearance of the internal carotid artery. LEFT CAROTID SYSTEM: Common carotid artery is patent. Mild calcific atherosclerosis of the carotid bifurcation without hemodynamically significant stenosis by NASCET criteria. Normal appearance of the internal carotid artery. VERTEBRAL ARTERIES:Left vertebral artery is dominant. Normal appearance of the vertebral arteries, widely patent. SKELETON: No acute osseous process though bone windows have not been submitted. OTHER NECK: Soft tissues of the neck are nonacute though, not tailored for evaluation. UPPER CHEST: Mosaic lung attenuation seen with small airway disease and/or pulmonary edema. Narrowed tracheobronchial tree, possible malacia. Streak artifact from LEFT cardiac pacemaker battery pack. CTA HEAD FINDINGS: ANTERIOR CIRCULATION: Patent cervical internal carotid arteries, petrous, cavernous and supra clinoid internal carotid arteries. Severe LEFT and moderate RIGHT stenosis supraclinoid ICA's. Patent anterior communicating artery. Gradual loss of LEFT A2 contrast opacification, poor collateralization. Patent RIGHT ACA. Irregular LEFT MCA, occluded at aneurysm clip with intermediate collaterals. Patent RIGHT middle cerebral artery, severe stenosis RIGHT M2 segments. No contrast extravasation or aneurysm. POSTERIOR CIRCULATION: Patent vertebral arteries, vertebrobasilar junction and basilar artery, as well as main branch vessels. Patent posterior cerebral arteries, moderate luminal irregularity compatible with atherosclerosis. Small LEFT posterior communicating artery. No large vessel occlusion, flow-limiting stenosis, contrast  extravasation or aneurysm. VENOUS SINUSES: Major dural venous sinuses are patent though not tailored for evaluation on this angiographic examination. ANATOMIC VARIANTS: None. DELAYED PHASE: Not performed. MIP images reviewed. CT Brain Perfusion Findings: Rapid data processing failed. IMPRESSION: CTA NECK: 1. No hemodynamically significant stenosis ICA's. 2. Patent vertebral arteries. 3. **An incidental finding of potential clinical significance has been found. 3.9 cm ascending aorta. Recommend annual imaging followup by CTA or MRA. This recommendation follows 2010 ACCF/AHA/AATS/ACR/ASA/SCA/SCAI/SIR/STS/SVM Guidelines for the Diagnosis and Management of Patients with Thoracic Aortic Disease. Circulation.2010; 121: Z563-O756. Aortic aneurysm NOS (ICD10-I71.9)** CTA HEAD: 1. Emergent LEFT A2 occlusion, poor collateralization by single-phase CTA. 2. Severe LEFT and moderate RIGHT supraclinoid ICA stenosis. 3. Occluded LEFT MCA at aneurysm clip, intermediate collaterals by single-phase CTA. CT PERFUSION: 1. Rapid data processing failed. Critical Value/emergent results text paged to Dr.MCNEILL [facility] via AMION secure system on 12/03/2018 at 1:35 am, including interpreting physician's phone number. Perfusion failure discussed with and reconfirmed by Dr.MCNEILL [facility] on 12/03/2018 at 2:20 am. Electronically Signed  By: Elon Alas M.D.   On: 12/03/2018 02:24    Ct Cerebral Perfusion W Contrast   Result Date: 12/03/2018 CLINICAL DATA:  Facial droop.  Nonverbal. EXAM: CT ANGIOGRAPHY HEAD AND NECK CT PERFUSION BRAIN TECHNIQUE: Multidetector CT imaging of the head and neck was performed using the standard protocol during bolus administration of intravenous contrast. Multiplanar CT image reconstructions and MIPs were obtained to evaluate the vascular anatomy. Carotid stenosis measurements (when applicable) are obtained utilizing NASCET criteria, using the distal internal carotid diameter as the denominator.  Multiphase CT imaging of the brain was performed following IV bolus contrast injection. Subsequent parametric perfusion maps were calculated using RAPID software. CONTRAST:  117mL OMNIPAQUE IOHEXOL 350 MG/ML SOLN, 38mL ISOVUE-370 IOPAMIDOL (ISOVUE-370) INJECTION 76%; 112mL OMNIPAQUE IOHEXOL 350 MG/ML SOLN COMPARISON:  CT HEAD December 03, 2018 FINDINGS: CTA NECK FINDINGS: AORTIC ARCH: 3.9 cm ascending aorta. Moderate calcific atherosclerosis. The origins of the innominate, left Common carotid artery and subclavian artery are patent. RIGHT CAROTID SYSTEM: Common carotid artery is patent, trace calcific atherosclerosis. Moderate calcific atherosclerosis of the carotid bifurcation without hemodynamically significant stenosis by NASCET criteria. Normal appearance of the internal carotid artery. LEFT CAROTID SYSTEM: Common carotid artery is patent. Mild calcific atherosclerosis of the carotid bifurcation without hemodynamically significant stenosis by NASCET criteria. Normal appearance of the internal carotid artery. VERTEBRAL ARTERIES:Left vertebral artery is dominant. Normal appearance of the vertebral arteries, widely patent. SKELETON: No acute osseous process though bone windows have not been submitted. OTHER NECK: Soft tissues of the neck are nonacute though, not tailored for evaluation. UPPER CHEST: Mosaic lung attenuation seen with small airway disease and/or pulmonary edema. Narrowed tracheobronchial tree, possible malacia. Streak artifact from LEFT cardiac pacemaker battery pack. CTA HEAD FINDINGS: ANTERIOR CIRCULATION: Patent cervical internal carotid arteries, petrous, cavernous and supra clinoid internal carotid arteries. Severe LEFT and moderate RIGHT stenosis supraclinoid ICA's. Patent anterior communicating artery. Gradual loss of LEFT A2 contrast opacification, poor collateralization. Patent RIGHT ACA. Irregular LEFT MCA, occluded at aneurysm clip with intermediate collaterals. Patent RIGHT middle cerebral  artery, severe stenosis RIGHT M2 segments. No contrast extravasation or aneurysm. POSTERIOR CIRCULATION: Patent vertebral arteries, vertebrobasilar junction and basilar artery, as well as main branch vessels. Patent posterior cerebral arteries, moderate luminal irregularity compatible with atherosclerosis. Small LEFT posterior communicating artery. No large vessel occlusion, flow-limiting stenosis, contrast extravasation or aneurysm. VENOUS SINUSES: Major dural venous sinuses are patent though not tailored for evaluation on this angiographic examination. ANATOMIC VARIANTS: None. DELAYED PHASE: Not performed. MIP images reviewed. CT Brain Perfusion Findings: Rapid data processing failed. IMPRESSION: CTA NECK: 1. No hemodynamically significant stenosis ICA's. 2. Patent vertebral arteries. 3. **An incidental finding of potential clinical significance has been found. 3.9 cm ascending aorta. Recommend annual imaging followup by CTA or MRA. This recommendation follows 2010 ACCF/AHA/AATS/ACR/ASA/SCA/SCAI/SIR/STS/SVM Guidelines for the Diagnosis and Management of Patients with Thoracic Aortic Disease. Circulation.2010; 121: Z610-R604. Aortic aneurysm NOS (ICD10-I71.9)** CTA HEAD: 1. Emergent LEFT A2 occlusion, poor collateralization by single-phase CTA. 2. Severe LEFT and moderate RIGHT supraclinoid ICA stenosis. 3. Occluded LEFT MCA at aneurysm clip, intermediate collaterals by single-phase CTA. CT PERFUSION: 1. Rapid data processing failed. Critical Value/emergent results text paged to Dr.MCNEILL [facility] via AMION secure system on 12/03/2018 at 1:35 am, including interpreting physician's phone number. Perfusion failure discussed with and reconfirmed by Dr.MCNEILL [facility] on 12/03/2018 at 2:20 am. Electronically Signed   By: Elon Alas M.D.   On: 12/03/2018 02:24    Dg [facility] [address]  Result Date: 12/04/2018 CLINICAL DATA:  Congestive heart failure. EXAM: PORTABLE CHEST 1 VIEW COMPARISON:  10/11/2018  FINDINGS: There is new atelectasis or consolidation at the left lung base. Chronic mild cardiomegaly. Pacemaker in place. Aortic atherosclerosis. Pulmonary vascularity is within normal limits. Markings are accentuated at the right lung base due to a shallow inspiration. No significant bone abnormality. IMPRESSION: New area of atelectasis or consolidation at the left lung base. Aortic Atherosclerosis (ICD10-I70.0). Electronically Signed   By: Lorriane Shire M.D.   On: 12/04/2018 12:18    Ct Head Code Stroke Wo Contrast   Result Date: 12/03/2018 CLINICAL DATA:  Code stroke.  Facial droop.  Nonverbal. EXAM: CT HEAD WITHOUT CONTRAST TECHNIQUE: Contiguous axial images were obtained from the base of the skull through the vertex without intravenous contrast. COMPARISON:  CT HEAD January 11, 2015 FINDINGS: BRAIN: Mesial LEFT frontal blurring of the gray-white matter junction. No intraparenchymal hemorrhage, mass effect nor midline shift. LEFT frontotemporoparietal cystic encephalomalacia. Old basal ganglia and bilateral thalami small infarcts. Old LEFT > RIGHT cerebellar infarcts. Confluent supratentorial white matter hypodensities. Ex vacuo dilatation LEFT lateral ventricle. No hydrocephalus. Scattered punctate parenchymal versus extra-axial calcifications. No abnormal extra-axial fluid collections. Basal cisterns are patent. VASCULAR: Moderate to severe calcific atherosclerosis of the carotid siphons. Status post clipping of LEFT MCA bifurcation aneurysm. SKULL: No skull fracture. Severe bilateral temporomandibular osteoarthrosis. Old LEFT frontal craniotomy. No significant scalp soft tissue swelling. SINUSES/ORBITS: Trace paranasal sinus mucosal thickening. Mastoid air cells are well aerated.The included ocular globes and orbital contents are non-suspicious. Status post bilateral ocular lens implants. OTHER: None. ASPECTS [facility] Stroke Program Early CT Score) - Ganglionic level infarction (caudate, lentiform nuclei,  internal capsule, insula, M1-M3 cortex): 7 - Supraganglionic infarction (M4-M6 cortex): 3 Total score (0-10 with 10 being normal): 10 IMPRESSION: 1. Acute nonhemorrhagic LEFT frontal/ACA territory infarct. 2. Old large LEFT MCA territory infarct, status post clipping of LEFT MCA aneurysm. 3. Moderate to severe chronic small vessel ischemic changes. Old small basal ganglia, thalami and cerebellar infarcts. 4. ASPECTS is 10. 5. Critical Value/emergent results were called by telephone at the time of interpretation on 12/03/2018 at 1:22 am to Dr. Leonel Ramsay, Neurology, who verbally acknowledged these results. Electronically Signed   By: Elon Alas M.D.   On: 12/03/2018 01:23             Medical Problem List and Plan: 1.  Functional decline secondary to Left frontal/ACA infarct.    -admit to inpatient rehab 2.  Antithrombotics: -DVT/anticoagulation:  Pharmaceutical: Lovenox             -antiplatelet therapy: Low dose ASA 3. Pain Management: Tylenol prn 4. Mood: LCSW to follow for evaluation and support.              -antipsychotic agents: N/A 5. Neuropsych: This patient is not fully capable of making decisions on on own behalf. 6. Skin/Wound Care: routine pressure relief measures.  7. Fluids/Electrolytes/Nutrition: Monitor I/O. Check lytes in am.  8. HTN: Monitor BP bid. Continue Amlodipine and Cozaar daily 9. Dyslipidemia: On pravastatin.  10. 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            Bary Leriche, PA-C 12/04/2018  Post Admission Physician Evaluation: 1. Functional deficits secondary  to left frontal infarct. 2. Patient is admitted to receive collaborative, interdisciplinary care between the physiatrist, rehab nursing staff, and therapy team. 3. Patient's level of medical complexity and substantial therapy needs in context of that medical necessity cannot be provided at a  lesser intensity of care such as a SNF. 4. Patient has experienced substantial functional loss from his/her  baseline which was documented above under the "Functional History" and "Functional Status" headings.  Judging by the patient's diagnosis, physical exam, and functional history, the patient has potential for functional progress which will result in measurable gains while on inpatient rehab.  These gains will be of substantial and practical use upon discharge  in facilitating mobility and self-care at the household level. 5. Physiatrist will provide 24 hour management of medical needs as well as oversight of the therapy plan/treatment and provide guidance as appropriate regarding the interaction of the two. 6. The Preadmission Screening has been reviewed and patient status is unchanged unless otherwise stated above. 7. 24 hour rehab nursing will assist with bladder management, bowel management, safety, skin/wound care, disease management, medication administration, pain management and patient education  and help integrate therapy concepts, techniques,education, etc. 8. PT will assess and treat for/with: Lower extremity strength, range of motion, stamina, balance, functional mobility, safety, adaptive techniques and equipment, NMR, family ed.   Goals are: supervision. 9. OT will assess and treat for/with: ADL's, functional mobility, safety, upper extremity strength, adaptive techniques and equipment, NMR, family education.   Goals are: min assist. Therapy may proceed with showering this patient. 10. SLP will assess and treat for/with: speech, language, cognition, family education.  Goals are: min assist. 11. Case Management and Social Worker will assess and treat for psychological issues and discharge planning. 12. Team conference will be held weekly to assess progress toward goals and to determine barriers to discharge. 13. Patient will receive at least 3 hours of therapy per day at least 5 days per week. 14. ELOS: 12-15 days       15. Prognosis:  excellent   I have personally performed a face to face  diagnostic evaluation of this patient and formulated the key components of the plan.  Additionally, I have personally reviewed laboratory data, imaging studies, as well as relevant notes and concur with the physician assistant's documentation above.  Meredith Staggers, MD, Mellody Drown

## 2018-12-07 NOTE — Progress Notes (Addendum)
Initial Nutrition Assessment  DOCUMENTATION CODES:   Not applicable  INTERVENTION:  Ensure Enlive po BID, each supplement provides 350 kcal and 20 grams of protein  Magic cup BID with meals, each supplement provides 290 kcal and 9 grams of protein   NUTRITION DIAGNOSIS:   Inadequate oral intake related to dysphagia, lethargy/confusion(Baseline dementia, stroke, aphasia) as evidenced by other (comment)(Per RN report).   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 Pt presents to the ED with right-sided weakness and aphasia. Pt had/has some mild baseline aphasia, but able to have a conversation. Pt now admitted with Stroke (cerebum).    Pt is to be discharged today to CIR; Transfer to [place] for ongoing PT, OT and ST.   Per char, PTA pt resided at [facility] with assistance from personal caregivers.   Per chart meal completion has been between 10-100% since 3/10. Speaking with NT, she was able to eat 100% of breakfast this morning 12/07/2018 and about 50% of lunch. Pt received dinner tray when seeing pt. NT states that she eats really well, but requires supplemental supervision and feeding assistance, without concerns for aspiration.   Pt was unable to provide any information regarding intake or weight history.  NFPE findings show some mild muscle and fat depletions. Pt is at risk for malnutrition, but would require further information for diagnosis.   Per chart pt is able to walk unaided at baseline, however, there is a  walker in room.   Labs reviewed:  Glucose 111(H)  Medications reviewed and include:  MVI with minerals  NUTRITION - FOCUSED PHYSICAL EXAM:    Most Recent Value  Orbital Region  Mild depletion  Upper Arm Region  Mild depletion  Thoracic and Lumbar Region  No depletion  Buccal Region  No depletion  Temple Region  Mild depletion  Clavicle Bone Region  No depletion  Clavicle and Acromion Bone Region  Mild depletion  Scapular Bone Region  No depletion  Dorsal Hand  Mild depletion  Patellar Region  Mild depletion  Anterior Thigh Region  Mild depletion  Posterior Calf Region  Mild depletion  Edema (RD Assessment)  None  Hair  Reviewed  Eyes  Reviewed  Mouth  Reviewed  Skin  Reviewed  Nails  Reviewed       Diet Order:   Diet Order    None      EDUCATION NEEDS:   No education needs have been identified at this time  Skin:  Skin Assessment: Reviewed RN Assessment  Last BM:  3/11  Height:   Ht Readings from Last 1 Encounters:  12/03/18 5\' 5"  (1.651 m)    Weight:   Wt Readings from Last 1 Encounters:  12/07/18 64.5 kg    Ideal Body Weight:  56.8 kg  BMI:  Body mass index is 25.53 kg/m.  Estimated Nutritional Needs:   Kcal:  1400-1700  Protein:  70-85 grams  Fluid:  >1.5L     Herma Carson, [place] Dietetic Intern

## 2018-12-07 NOTE — Progress Notes (Signed)
  Speech Language Pathology Treatment: Dysphagia  Patient Details Name: [name] MRN: [id number] DOB: 12-01-1921 Today's Date: 12/07/2018 Time: 1219-7588 SLP Time Calculation (min) (ACUTE ONLY): 35 min  Assessment / Plan / Recommendation Clinical Impression  Pt sitting in recliner.  Assisted with lunch meal.  Pt able to feed herself 50% of meal; clinician offered supplemental assistance intermittently due to fatigue and tachypnea as meal progressed. No concerns for aspiration.  Impersistence noted occasionally when eating solid foods, requiring verbal cues to continue through task.    Pt was quieter today.  She demonstrated improved ability to follow one-step commands with 80% accuracy for predictable contexts.  Spontaneous verbal output was limited to single words; when encouraged, she reluctantly produced 2-3 word utterances that were marked by apraxic errors but intelligible output.  No family present today. SLP will continue to follow acute pending D/C to CIR.     HPI HPI: 85 y.o. female with history of a permanent pacemaker for complete AV block, previous strokes, cerebral aneurysm clipping in 1993, hypothyroidism, hypertension, hyperlipidemia, dementia, glucose intolerance, coronary artery disease, and congestive heart failure presented with right-sided weakness and aphasia.  CT head - Acute nonhemorrhagic LEFT frontal/ACA territory infarct; TPA started 12/03/18. PTA, pt resided at [facility] with assistance from personal caregivers. She has baseline mild dementia and mild aphasia, but is normally interactive and conversational.       SLP Plan  Continue with current plan of care       Recommendations  Diet recommendations: Dysphagia 1 (puree);Thin liquid Liquids provided via: Cup;Straw Medication Administration: Crushed with puree Supervision: Full supervision/cueing for compensatory strategies Compensations: Minimize environmental distractions               Oral Care Recommendations: Oral care BID Follow up Recommendations: Inpatient Rehab SLP Visit Diagnosis: Aphasia (R47.01);Dysphagia, oropharyngeal phase (R13.12) Plan: Continue with current plan of care       GO                [name] 12/07/2018, 3:29 PM  [name] [name], [name] Office number [phone] Pager [phone]

## 2018-12-07 NOTE — Progress Notes (Signed)
Pt is discharging to CIR today. CM signing off.  

## 2018-12-07 NOTE — Care Management Note (Signed)
Case Management Note  Patient Details  Name: [name] MRN: [id number] Date of Birth: 05/02/1922  Subjective/Objective:                    Action/Plan: Pt discharging to CIR. CM signing off.  Expected Discharge Date:  12/07/18               Expected Discharge Plan:     In-House Referral:     Discharge planning Services     Post Acute Care Choice:    Choice offered to:     DME Arranged:    DME Agency:     HH Arranged:    HH Agency:     Status of Service:     If discussed at [employer], dates discussed:    Additional Comments:  Pollie Friar, RN 12/07/2018, 4:51 PM

## 2018-12-07 NOTE — Progress Notes (Signed)
PIVs removed per CIR request. AVS documented. Telemetry d/c. Will transport to CIR in bed.

## 2018-12-07 NOTE — Discharge Instructions (Signed)
You have a mildly enlarged Ascending Aorta that will need yearly follow up. Please discuss with your primary care provider.

## 2018-12-07 NOTE — Plan of Care (Signed)

## 2018-12-07 NOTE — Progress Notes (Signed)
Patient was admitted to IR via hospital bed. Patient was educated on [employer] and procedures. Patient has expressive aphagia so she was only able to communicate by head nodes. Patient shows no signs of pain using the face chart. Patient is in bed with bed alarm on and call bell within reach. Doy Hutching, LPN

## 2018-12-08 ENCOUNTER — Inpatient Hospital Stay (HOSPITAL_COMMUNITY): Payer: Medicare Other | Admitting: Speech Pathology

## 2018-12-08 ENCOUNTER — Inpatient Hospital Stay (HOSPITAL_COMMUNITY): Payer: Medicare Other | Admitting: Physical Therapy

## 2018-12-08 ENCOUNTER — Inpatient Hospital Stay (HOSPITAL_COMMUNITY): Payer: Medicare Other | Admitting: Occupational Therapy

## 2018-12-08 DIAGNOSIS — E876 Hypokalemia: Secondary | ICD-10-CM

## 2018-12-08 DIAGNOSIS — I69320 Aphasia following cerebral infarction: Principal | ICD-10-CM

## 2018-12-08 LAB — CBC WITH DIFFERENTIAL/PLATELET
Abs Immature Granulocytes: 0.05 10*3/uL (ref 0.00–0.07)
Basophils Absolute: 0.1 10*3/uL (ref 0.0–0.1)
Basophils Relative: 1 %
Eosinophils Absolute: 0.4 10*3/uL (ref 0.0–0.5)
Eosinophils Relative: 4 %
HCT: 43.3 % (ref 36.0–46.0)
Hemoglobin: 14.5 g/dL (ref 12.0–15.0)
Immature Granulocytes: 1 %
Lymphocytes Relative: 19 %
Lymphs Abs: 1.8 10*3/uL (ref 0.7–4.0)
MCH: 31 pg (ref 26.0–34.0)
MCHC: 33.5 g/dL (ref 30.0–36.0)
MCV: 92.7 fL (ref 80.0–100.0)
MONOS PCT: 10 %
Monocytes Absolute: 0.9 10*3/uL (ref 0.1–1.0)
Neutro Abs: 6.1 10*3/uL (ref 1.7–7.7)
Neutrophils Relative %: 65 %
Platelets: 263 10*3/uL (ref 150–400)
RBC: 4.67 MIL/uL (ref 3.87–5.11)
RDW: 12.5 % (ref 11.5–15.5)
WBC: 9.4 10*3/uL (ref 4.0–10.5)
nRBC: 0 % (ref 0.0–0.2)

## 2018-12-08 LAB — COMPREHENSIVE METABOLIC PANEL
ALT: 35 U/L (ref 0–44)
ANION GAP: 11 (ref 5–15)
AST: 25 U/L (ref 15–41)
Albumin: 3 g/dL — ABNORMAL LOW (ref 3.5–5.0)
Alkaline Phosphatase: 57 U/L (ref 38–126)
BUN: 23 mg/dL (ref 8–23)
CO2: 21 mmol/L — ABNORMAL LOW (ref 22–32)
Calcium: 8 mg/dL — ABNORMAL LOW (ref 8.9–10.3)
Chloride: 104 mmol/L (ref 98–111)
Creatinine, Ser: 0.68 mg/dL (ref 0.44–1.00)
GFR calc Af Amer: >60 mL/min (ref >60)
GFR calc non Af Amer: >60 mL/min (ref >60)
Glucose, Bld: 109 mg/dL — ABNORMAL HIGH (ref 70–99)
Potassium: 3.4 mmol/L — ABNORMAL LOW (ref 3.5–5.1)
Sodium: 136 mmol/L (ref 135–145)
Total Bilirubin: 0.9 mg/dL (ref 0.3–1.2)
Total Protein: 5.7 g/dL — ABNORMAL LOW (ref 6.5–8.1)

## 2018-12-08 MED ORDER — POTASSIUM CHLORIDE CRYS ER 20 MEQ PO TBCR
20.0000 meq | EXTENDED_RELEASE_TABLET | Freq: Every day | ORAL | Status: DC
  Administered 2018-12-08 – 2018-12-10 (×3): 20 meq via ORAL
  Filled 2018-12-08 (×3): qty 1

## 2018-12-08 NOTE — Progress Notes (Signed)
Physical Therapy Assessment and Plan  Patient Details  Name: [name] MRN: [id number] Date of Birth: June 04, 1922  PT Diagnosis: Abnormal posture, Cognitive deficits, Coordination disorder, Difficulty walking, Hemiplegia dominant, Impaired cognition and Muscle weakness Rehab Potential: Good ELOS: 21-24 days   Today's Date: 12/08/2018 PT Individual Time: 1607-3710 PT Individual Time Calculation (min): 60 min    Problem List:  Patient Active Problem List   Diagnosis Date Noted  . Acute right ACA stroke ([place]) 12/07/2018  . Stroke (cerebrum) ([place]) 12/03/2018  . Cough 10/11/2018  . Abnormal breath sounds 10/11/2018  . Closed nondisplaced fracture of proximal phalanx of lesser toe of right foot 06/26/2018  . Right ankle pain 06/21/2018  . Right foot pain 06/21/2018  . Abnormal CXR 04/26/2017  . Lobar pneumonia ([place]) 01/20/2017  . Acute on chronic respiratory failure with hypoxia ([place]) 01/17/2017  . Acute respiratory failure with hypoxia ([place]) 01/17/2017  . DOE (dyspnea on exertion) 10/23/2014  . Hyponatremia 10/23/2014  . RLL pneumonia ([place]) 10/11/2013  . Other specified anemias 10/10/2013  . Acute respiratory failure ([place]) 08/15/2013  . Aspiration pneumonia ([place]) 08/15/2013  . Altered mental status 08/15/2013  . Incarcerated femoral hernia 08/14/2013  . Esophageal reflux 08/05/2013  . Unspecified constipation 07/03/2013  . Closed fracture of left hip ([place]) 07/01/2013  . 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 Atrial fibrillation ([place]) 10/30/2012  . Low back pain 10/26/2012  . Syncope 10/26/2012  . PNA (pneumonia) 09/26/2012  . UTI (lower urinary tract infection) 09/26/2012  . Hypokalemia 09/26/2012  . Leukocytosis 09/26/2012  . Cardiac enzymes elevated 07/25/2011  . Impaired glucose tolerance 06/03/2011  . Preventative health care 06/03/2011  . IRON DEFICIENCY 12/06/2010  . 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 CVA (cerebral vascular accident) ([place])  01/09/2009  . Aphasia 01/09/2009  . Urinary incontinence 01/09/2009  . PACEMAKER, PERMANENT 01/09/2009  . Dementia ([place]) 05/19/2008  . Hypothyroidism 11/20/2007  . Hyperlipidemia 11/20/2007  . Essential hypertension 11/20/2007  . DIVERTICULOSIS, COLON 11/20/2007  . FATIGUE 11/20/2007  . CEREBROVASCULAR ACCIDENT, HX OF 11/20/2007  . TIA 05/14/2003    Past Medical History:  Past Medical History:  Diagnosis Date  . Aphasia 01/09/2009  . Arthritis    right hand  . 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 CEREBROVASCULAR ACCIDENT, HX OF 11/20/2007  . CHF (congestive heart failure) ([place])   . Coronary artery disease   . CVA 01/09/2009  . DEMENTIA 05/19/2008  . Dementia ([place])   . DIVERTICULOSIS, COLON 11/20/2007  . FATIGUE 11/20/2007  . Femoral fracture ([place]) 07/01/2013  . GLUCOSE INTOLERANCE 12/06/2010  . HYPERLIPIDEMIA 11/20/2007  . HYPERTENSION 11/20/2007   Echo was done 07/07/11: mod LVH, EF 55-60%, grade 1 diast dysfxn, mild MR, mild LAE, mod TR, PASP 39  . HYPOTHYROIDISM 11/20/2007  . Impaired glucose tolerance 06/03/2011  . IRON DEFICIENCY 12/06/2010  . Pacemaker 2002  . PACEMAKER, PERMANENT 01/09/2009  . TIA 05/14/2003   elevated CE's in setting of TIA and falls in 10/12 - echo normal with no further cardiac w/u pursued  . TIA (transient ischemic attack) 10/10/2011  . Urinary incontinence 01/09/2009   Past Surgical History:  Past Surgical History:  Procedure Laterality Date  . ABDOMINAL HYSTERECTOMY    . CHOLECYSTECTOMY    . CYSTOCELE REPAIR N/A 11/26/2012   Procedure: Cystocele Repair with Lefort Colpocleisis.  colpectomy Levatorplasty Perineorraphy;  Surgeon: Lovenia Kim, MD;  Location: [facility];  Service: Gynecology;  Laterality: N/A;  Cystocele Repair with Lefort Colpocleisis.  Marland Kitchen  HIP ARTHROPLASTY Left 07/02/2013   Procedure: ARTHROPLASTY BIPOLAR HIP;  Surgeon: Mauri Pole, MD;  Location: [facility];  Service: Orthopedics;  Laterality: Left;  . INGUINAL HERNIA REPAIR Right 08/13/2013    Procedure: HERNIA REPAIR INGUINAL INCARCERATED;  Surgeon: Ralene Ok, MD;  Location: [facility];  Service: General;  Laterality: Right;  . Medtronic Cynthia dual chamber pacemaker serial number was [id number] H...04/15/2009    . s/p cerebral aneurysm  1991  . s/p pacemaker DDD    . s/p retinal attachment    . s/p thyroidectomy      Assessment & Plan Clinical Impression: Patient is a 89 year oldRH-female with history of stroke with mild aphasia, aneurysm clipping 1993, ocular stroke 2005, AV block s/p PPM,mild dementia who was admitted on 12/03/2018 after being found with right-sided weakness and aphasia. CTA head neck showed emergent left A2 occlusion with poor collateralization and severe left and moderate right supraclinoid ICA stenosis, occluded left MCA at aneurysm clip with intermediate collaterals and 3.9 cm ascending aortic aneurysm.. She received IV tPA and Dr.Xufeltthatstroke embolic due to A. Fib.Follow up CT head showed evolving acute left frontal/ACA territory infarct with new petechial hemorrhage and old large L-MCA territory infarct. She was cleared to start ASA yesterday with recommendations to repeat CT in about a week and decide on DOAC based on CT findings.2D echo done showing EF 55% with moderate increase left ventricular wall thickness, severely dilated left atrium moderate TVR, moderate calcification of mitral valve and aortic valve.She did have issues with mild respiratory distress due to fluid overload therefore IVF discontinued. ST exam revealed mixed dysfluent aphasia with slow speech and perseveratory motor responses She was placed on dysphagia 1, thins liquids. She is able to follow simple commands with increased time and working of pregait activity. CIR recommended due to functional decline.Patient transferred to CIR on 12/07/2018 .   Patient currently requires total with mobility secondary to muscle weakness, decreased cardiorespiratoy endurance, unbalanced muscle  activation, motor apraxia, decreased coordination and decreased motor planning, decreased visual perceptual skills and decreased visual motor skills, decreased attention to left, decreased initiation, decreased awareness, decreased problem solving, decreased safety awareness and delayed processing and decreased sitting balance, decreased standing balance, decreased postural control, hemiplegia and decreased balance strategies.  Prior to hospitalization, patient was modified independent  with mobility and lived with Alone in a Independent living facility([facility]) home.  Home access is  Level entry.  Patient will benefit from skilled PT intervention to maximize safe functional mobility, minimize fall risk and decrease caregiver burden for planned discharge home with 24 hour assist.  Anticipate patient will benefit from follow up [facility] at discharge.  PT - End of Session Activity Tolerance: Tolerates < 10 min activity, no significant change in vital signs Endurance Deficit: Yes Endurance Deficit Description: increased work of breathing w/ all mobility PT Assessment Rehab Potential (ACUTE/IP ONLY): Good PT Barriers to Discharge: Other (comments) PT Barriers to Discharge Comments: baseline cognitive deficits and aphasia PT Patient demonstrates impairments in the following area(s): Balance;Endurance;Motor;Perception;Safety PT Transfers Functional Problem(s): Bed Mobility;Bed to Chair;Car;Furniture;Floor PT Locomotion Functional Problem(s): Ambulation;Wheelchair Mobility;Stairs PT Plan PT Intensity: Minimum of 1-2 x/day ,45 to 90 minutes PT Frequency: 5 out of 7 days PT Duration Estimated Length of Stay: 21-24 days PT Treatment/Interventions: Ambulation/gait training;Discharge planning;Cognitive remediation/compensation;DME/adaptive equipment instruction;Functional mobility training;Pain management;Psychosocial support;Splinting/orthotics;Therapeutic Activities;UE/LE Strength  taining/ROM;Visual/perceptual remediation/compensation;UE/LE Coordination activities;Wheelchair propulsion/positioning;Stair training;Therapeutic Exercise;Patient/family education;Neuromuscular re-education;Skin care/wound management;Functional electrical stimulation;Disease management/prevention;Community reintegration;Balance/vestibular training PT Transfers Anticipated Outcome(s): min assist PT Locomotion Anticipated Outcome(s): min assist  short distance gait w/ LRAD PT Recommendation Follow Up Recommendations: Home health PT(lives in ILF) Patient destination: Home(ILF) Equipment Recommended: To be determined Equipment Details: has rollator, no w/c  Skilled Therapeutic Intervention  Pt in supine and agreeable to therapy, denies pain at rest. Pt aphasic, however able to reach appropriate response for simple questions w/ extra time. Max assist bed mobility and max assist transfer to w/c as detailed below. Pt verbalizing "wet" repeatedly and nodded yes when asked if she needed to toilet. Max-total assist stand pivot transfer to toilet. Total assist for pericare, pt incontinent void in brief and continent BM. BLEs able to support pt in standing, however needing total assist to boost into standing and max-total assist to move hips during pivoting. Total assist w/c transport to/from therapy gym. Attempted sit<>stand to RW, ultimately needed +2 to boost into standing from w/c, suspect 2/2 fatigue. Stood to [employer] for 5-10 sec min-mod assist x2 for safety. Returned to room and instructed pt and daughter in law (who arrived at end of session) in results of PT evaluation as detailed below, PT POC, rehab potential, rehab goals, and discharge recommendations. Total assist +1 to stand in stedy and transfer to EOB. Ended session in supine, all needs in reach.  PT Evaluation Precautions/Restrictions Precautions Precautions: Fall Restrictions Weight Bearing Restrictions: No Pain Pain Assessment Pain Scale:  0-10 Pain Score: 0-No pain Faces Pain Scale: No hurt Home Living/Prior Functioning Home Living Available Help at Discharge: Personal care attendant;Available 24 hours/day Type of Home: Independent living facility([facility]) Home Access: Level entry Home Layout: One level Bathroom Shower/Tub: Multimedia programmer: Handicapped height  Lives With: Alone Prior Function Level of Independence: Requires assistive device for independence;Needs assistance with homemaking;Needs assistance with ADLs(modified independent w/ rollator around IRF)  Able to Take Stairs?: No Driving: No Vision/Perception  Vision - Assessment Eye Alignment: Within Functional Limits Ocular Range of Motion: Restricted on the left Alignment/Gaze Preference: Gaze right Tracking/Visual Pursuits: Decreased smoothness of eye movement to LEFT superior field;Decreased smoothness of eye movement to LEFT inferior field;Requires cues, head turns, or add eye shifts to track Additional Comments: assessment affected by aphasia, will continue to assess Perception Perception: Impaired Inattention/Neglect: Does not attend to left side of body Comments: impaired midline orientation, R lateral lean Praxis Praxis: Impaired Praxis Impairment Details: Initiation;Motor planning  Cognition Overall Cognitive Status: No family/caregiver present to determine baseline cognitive functioning Arousal/Alertness: Awake/alert Orientation Level: Oriented to person;Oriented to place Memory: Appears intact Awareness: Impaired Problem Solving: Impaired Safety/Judgment: Impaired Comments: decreased awareness of deficits Sensation Sensation Light Touch: Appears Intact(unsure of reliability 2/2 aphasia, moving all extremities in response to touch) Coordination Gross Motor Movements are Fluid and Coordinated: No Fine Motor Movements are Fluid and Coordinated: No Coordination and Movement Description: impaired bilaterally, R side>L  side Heel Shin Test: unable to assess 2/2 apraxia/aphasia  Motor  Motor Motor: Hemiplegia;Motor apraxia Motor - Skilled Clinical Observations: Motor apraxia R side>L side, R hemi  Mobility Bed Mobility Bed Mobility: Rolling Right;Rolling Left;Supine to Sit;Sit to Supine Rolling Right: Maximal Assistance - Patient 25-49% Rolling Left: Maximal Assistance - Patient 25-49% Supine to Sit: Maximal Assistance - Patient - Patient 25-49% Sit to Supine: Maximal Assistance - Patient 25-49% Transfers Transfers: Stand to [employer];Sit to Stand Sit to Stand: Total Assistance - Patient < 25% Stand to Sit: Total Assistance - Patient < 25% Stand Pivot Transfers: Maximal Assistance - Patient 25 - 49% Stand Pivot Transfer Details: Manual facilitation for weight bearing;Manual facilitation for weight  shifting;Manual facilitation for placement;Verbal cues for precautions/safety;Tactile cues for initiation;Verbal cues for technique;Tactile cues for sequencing;Tactile cues for posture;Tactile cues for placement Stand Pivot Transfer Details (indicate cue type and reason): pt does <25% when boosting to stance, does not reach full upright standing, max assist to swing hips towards chair Transfer (Assistive device): None Locomotion  Gait Ambulation: No Gait Gait: No Stairs / Additional Locomotion Stairs: No Wheelchair Mobility Wheelchair Mobility: No  Trunk/Postural Assessment  Cervical Assessment Cervical Assessment: Exceptions to WFL(forward head/rounded shoulder posture) Thoracic Assessment Thoracic Assessment: Within Functional Limits Lumbar Assessment Lumbar Assessment: Exceptions to WFL(posterior pelvic tilt) Postural Control Postural Control: Deficits on evaluation(impaired midline orientation, R lateral lean preference, delayed/insufficient righting reactions)  Balance Balance Balance Assessed: Yes Static Sitting Balance Static Sitting - Balance Support: Feet supported;No  upper extremity supported Static Sitting - Level of Assistance: 4: Min assist Dynamic Sitting Balance Dynamic Sitting - Balance Support: No upper extremity supported;Feet supported Dynamic Sitting - Level of Assistance: 2: Max assist Extremity Assessment  RLE Assessment RLE Assessment: Exceptions to [facility] Passive Range of Motion (PROM) Comments: [facility] General Strength Comments: unable to formally assess 2/2 aphasia/apraxia, pt moving extremity against gravity, noticable weakness in stance compared to L side LLE Assessment LLE Assessment: Within Functional Limits    Refer to Care Plan for Long Term Goals  Recommendations for other services: None   Discharge Criteria: Patient will be discharged from PT if patient refuses treatment 3 consecutive times without medical reason, if treatment goals not met, if there is a change in medical status, if patient makes no progress towards goals or if patient is discharged from hospital.  The above assessment, treatment plan, treatment alternatives and goals were discussed and mutually agreed upon: by patient and by family  Braylyn Kalter Clent Demark 12/08/2018, 11:36 AM

## 2018-12-08 NOTE — Evaluation (Signed)
Occupational Therapy Assessment and Plan  Patient Details  Name: [name] MRN: [id number] Date of Birth: May 18, 1922  OT Diagnosis: acute pain, apraxia, cognitive deficits, disturbance of vision, hemiplegia affecting dominant side and muscle weakness (generalized) Rehab Potential: Rehab Potential (ACUTE ONLY): Fair ELOS: 21-24 days   Today's Date: 12/08/2018 OT Individual Time: 1345-1445 OT Individual Time Calculation (min): 60 min     Problem List:  Patient Active Problem List   Diagnosis Date Noted  . Acute right ACA stroke ([place]) 12/07/2018  . Stroke (cerebrum) ([place]) 12/03/2018  . Cough 10/11/2018  . Abnormal breath sounds 10/11/2018  . Closed nondisplaced fracture of proximal phalanx of lesser toe of right foot 06/26/2018  . Right ankle pain 06/21/2018  . Right foot pain 06/21/2018  . Abnormal CXR 04/26/2017  . Lobar pneumonia ([place]) 01/20/2017  . Acute on chronic respiratory failure with hypoxia ([place]) 01/17/2017  . Acute respiratory failure with hypoxia ([place]) 01/17/2017  . DOE (dyspnea on exertion) 10/23/2014  . Hyponatremia 10/23/2014  . RLL pneumonia ([place]) 10/11/2013  . Other specified anemias 10/10/2013  . Acute respiratory failure ([place]) 08/15/2013  . Aspiration pneumonia ([place]) 08/15/2013  . Altered mental status 08/15/2013  . Incarcerated femoral hernia 08/14/2013  . Esophageal reflux 08/05/2013  . Unspecified constipation 07/03/2013  . Closed fracture of left hip ([place]) 07/01/2013  . 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 Atrial fibrillation ([place]) 10/30/2012  . Low back pain 10/26/2012  . Syncope 10/26/2012  . PNA (pneumonia) 09/26/2012  . UTI (lower urinary tract infection) 09/26/2012  . Hypokalemia 09/26/2012  . Leukocytosis 09/26/2012  . Cardiac enzymes elevated 07/25/2011  . Impaired glucose tolerance 06/03/2011  . Preventative health care 06/03/2011  . IRON DEFICIENCY 12/06/2010  . 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 CVA  (cerebral vascular accident) ([place]) 01/09/2009  . Aphasia 01/09/2009  . Urinary incontinence 01/09/2009  . PACEMAKER, PERMANENT 01/09/2009  . Dementia ([place]) 05/19/2008  . Hypothyroidism 11/20/2007  . Hyperlipidemia 11/20/2007  . Essential hypertension 11/20/2007  . DIVERTICULOSIS, COLON 11/20/2007  . FATIGUE 11/20/2007  . CEREBROVASCULAR ACCIDENT, HX OF 11/20/2007  . TIA 05/14/2003    Past Medical History:  Past Medical History:  Diagnosis Date  . Aphasia 01/09/2009  . Arthritis    right hand  . 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 CEREBROVASCULAR ACCIDENT, HX OF 11/20/2007  . CHF (congestive heart failure) ([place])   . Coronary artery disease   . CVA 01/09/2009  . DEMENTIA 05/19/2008  . Dementia ([place])   . DIVERTICULOSIS, COLON 11/20/2007  . FATIGUE 11/20/2007  . Femoral fracture ([place]) 07/01/2013  . GLUCOSE INTOLERANCE 12/06/2010  . HYPERLIPIDEMIA 11/20/2007  . HYPERTENSION 11/20/2007   Echo was done 07/07/11: mod LVH, EF 55-60%, grade 1 diast dysfxn, mild MR, mild LAE, mod TR, PASP 39  . HYPOTHYROIDISM 11/20/2007  . Impaired glucose tolerance 06/03/2011  . IRON DEFICIENCY 12/06/2010  . Pacemaker 2002  . PACEMAKER, PERMANENT 01/09/2009  . TIA 05/14/2003   elevated CE's in setting of TIA and falls in 10/12 - echo normal with no further cardiac w/u pursued  . TIA (transient ischemic attack) 10/10/2011  . Urinary incontinence 01/09/2009   Past Surgical History:  Past Surgical History:  Procedure Laterality Date  . ABDOMINAL HYSTERECTOMY    . CHOLECYSTECTOMY    . CYSTOCELE REPAIR N/A 11/26/2012   Procedure: Cystocele Repair with Lefort Colpocleisis.  colpectomy Levatorplasty Perineorraphy;  Surgeon: Lovenia Kim, MD;  Location: [facility];  Service: Gynecology;  Laterality: N/A;  Cystocele Repair with Lefort Colpocleisis.  Marland Kitchen HIP ARTHROPLASTY Left 07/02/2013   Procedure: ARTHROPLASTY BIPOLAR HIP;  Surgeon: Mauri Pole, MD;  Location: [facility];  Service: Orthopedics;  Laterality: Left;  . INGUINAL  HERNIA REPAIR Right 08/13/2013   Procedure: HERNIA REPAIR INGUINAL INCARCERATED;  Surgeon: Ralene Ok, MD;  Location: [facility];  Service: General;  Laterality: Right;  . Medtronic Cynthia dual chamber pacemaker serial number was [id number] H...04/15/2009    . s/p cerebral aneurysm  1991  . s/p pacemaker DDD    . s/p retinal attachment    . s/p thyroidectomy      Assessment & Plan Clinical Impression: Patient is a 85 y.o. Right handed female with history of stroke with mild aphasia, aneurysm clipping 1993, ocular stroke 2005, AV block s/p PPM,mild dementia who was admitted on 12/03/2018 after being found with right-sided weakness and aphasia. CTA head neck showed emergent left A2 occlusion with poor collateralization and severe left and moderate right supraclinoid ICA stenosis, occluded left MCA at aneurysm clip with intermediate collaterals and 3.9 cm ascending aortic aneurysm.. She received IV tPA and Dr.Xufeltthatstroke embolic due to A. Fib.Follow up CT head showed evolving acute left frontal/ACA territory infarct with new petechial hemorrhage and old large L-MCA territory infarct. She was cleared to start ASA yesterday with recommendations to repeat CT in about a week and decide on DOAC based on CT findings.  2D echo done showing EF 55% with moderate increase left ventricular wall thickness, severely dilated left atrium moderate TVR, moderate calcification of mitral valve and aortic valve.She did have issues with mild respiratory distress due to fluid overload therefore IVF discontinued. ST exam revealed mixed dysfluent aphasia with slow speech and perseveratory motor responses She was placed on dysphagia 1, thins liquids. She is able to follow simple commands with increased time and working of pregait activity. CIR recommended due to functional decline..  Patient transferred to CIR on 12/07/2018 .    Patient currently requires max-total +2 assist with basic self-care skills secondary to  muscle weakness, decreased cardiorespiratoy endurance, impaired timing and sequencing, unbalanced muscle activation, motor apraxia and decreased coordination, decreased visual perceptual skills and decreased visual motor skills, decreased attention to right, decreased awareness, decreased problem solving, decreased safety awareness, decreased memory and delayed processing and decreased sitting balance, decreased standing balance, decreased postural control, hemiplegia and decreased balance strategies.  Prior to hospitalization, patient could complete ADLs with assistance for LB bathing/dressing, Mod I with Rollator for toilet transfers and toileting.  Patient will benefit from skilled intervention to decrease level of assist with basic self-care skills prior to discharge home with care partner.  Anticipate patient will require 24 hour supervision and minimal physical assistance and follow up home health.  OT - End of Session Activity Tolerance: Tolerates 30+ min activity with multiple rests Endurance Deficit: Yes Endurance Deficit Description: increased work of breathing w/ all mobility OT Assessment Rehab Potential (ACUTE ONLY): Fair OT Barriers to Discharge: Decreased caregiver support OT Patient demonstrates impairments in the following area(s): Balance;Cognition;Endurance;Motor;Pain;Perception;Safety;Sensory;Vision OT Basic ADL's Functional Problem(s): Eating;Bathing;Grooming;Dressing;Toileting OT Transfers Functional Problem(s): Toilet;Tub/Shower OT Additional Impairment(s): Fuctional Use of Upper Extremity OT Plan OT Intensity: Minimum of 1-2 x/day, 45 to 90 minutes OT Frequency: 5 out of 7 days OT Duration/Estimated Length of Stay: 21-24 days OT Treatment/Interventions: Balance/vestibular training;Cognitive remediation/compensation;Discharge planning;Disease mangement/prevention;DME/adaptive equipment instruction;Functional mobility training;Neuromuscular re-education;Pain  management;Patient/family education;Psychosocial support;Self Care/advanced ADL retraining;Skin care/wound managment;Splinting/orthotics;Therapeutic Activities;Therapeutic Exercise;UE/LE Strength taining/ROM;UE/LE Coordination activities;Visual/perceptual remediation/compensation;Wheelchair propulsion/positioning OT Self Feeding Anticipated Outcome(s): Supervision OT  Basic Self-Care Anticipated Outcome(s): Min assist OT Toileting Anticipated Outcome(s): Min assist OT Bathroom Transfers Anticipated Outcome(s): Min assist OT Recommendation Patient destination: Home Follow Up Recommendations: Home health OT;24 hour supervision/assistance Equipment Recommended: None recommended by OT(has all recommended equipment)   Skilled Therapeutic Intervention OT eval completed with discussion of rehab process, OT purpose, POC, ELOS, and goals.  ADL assessment completed with bathing and dressing at sit > stand at sink utilizing Stedy due to requiring increased assistance for sit > stand.  Pt required max assist of 1 initially for sit > stand in to [place] after unable to complete squat pivot transfer to w/c, requiring total +2 by end of session for sit > stand to [place] to transfer back to bed.  Engaged in [place] bathing and dressing at sink with pt demonstrating inconsistent use of RUE, occasionally beginning task with RUE and then ceasing to use it.  Pt with inconsistent inattention to RUE.  Required max assist for UB dressing and +2 for LB dressing.  Returned to bed at end of session due to fatigue.    Pt's daughter in law present throughout session providing encouragement and details regarding PLOF and home routine.    OT Evaluation Precautions/Restrictions  Precautions Precautions: Fall General   Vital Signs Therapy Vitals Temp: 98.1 F (36.7 C) Pulse Rate: 60 Resp: 14 BP: (!) 122/52 Patient Position (if appropriate): Lying Oxygen Therapy SpO2: 98 % O2 Device: Room Air Pain Pain Assessment Pain  Scale: 0-10 Pain Score: 0-No pain Home Living/Prior Functioning Home Living Living Arrangements: Alone Available Help at Discharge: Personal care attendant, Available 24 hours/day Type of Home: Independent living facility([facility]) Home Access: Level entry Home Layout: One level Bathroom Shower/Tub: Multimedia programmer: Handicapped height  Lives With: Alone IADL History Homemaking Responsibilities: No Prior Function Level of Independence: Requires assistive device for independence, Needs assistance with homemaking, Needs assistance with ADLs(Mod I with Rollator in Independent living, required assistance with LB bathing/dressing)  Able to Take Stairs?: No Driving: No ADL ADL Grooming: Minimal assistance Where Assessed-Grooming: Sitting at sink Upper Body Bathing: Moderate assistance Where Assessed-Upper Body Bathing: Sitting at sink, Standing at sink Lower Body Bathing: Dependent(+2) Where Assessed-Lower Body Bathing: Standing at sink(Stedy) Upper Body Dressing: Maximal assistance Where Assessed-Upper Body Dressing: Sitting at sink Lower Body Dressing: Dependent(+2) Vision Baseline Vision/History: Wears glasses Wears Glasses: At all times Vision Assessment?: Yes;Vision impaired- to be further tested in functional context(limited assessment due to fatigue and aphasia) Eye Alignment: Within Functional Limits Ocular Range of Motion: Restricted on the left Alignment/Gaze Preference: Gaze right Tracking/Visual Pursuits: Decreased smoothness of eye movement to LEFT superior field;Decreased smoothness of eye movement to LEFT inferior field;Requires cues, head turns, or add eye shifts to track Additional Comments: assessment affected by aphasia, will continue to assess Perception  Perception: Impaired Inattention/Neglect: Does not attend to left side of body Comments: impaired midline orientation, Rt lateral lean Praxis Praxis: Impaired Praxis Impairment Details:  Initiation;Motor planning Cognition Overall Cognitive Status: History of cognitive impairments - at baseline Arousal/Alertness: Awake/alert Orientation Level: Person Memory: Appears intact Awareness: Impaired Problem Solving: Impaired Safety/Judgment: Impaired Comments: decreased awareness of deficits.  Difficult to fully assess and unable to complete BIMS due to aphasia and fatigue Sensation Sensation Light Touch: Appears Intact Coordination Gross Motor Movements are Fluid and Coordinated: No Fine Motor Movements are Fluid and Coordinated: No Coordination and Movement Description: impaired bilaterally, R side>L side Finger Nose Finger Test: unable to assess 2/2 apraxia/aphasia Motor  Motor Motor: Hemiplegia;Motor apraxia  Motor - Skilled Clinical Observations: Motor apraxia R side>L side, R hemi Extremity/Trunk Assessment RUE Assessment RUE Assessment: Exceptions to [facility] Passive Range of Motion (PROM) Comments: WFL Active Range of Motion (AROM) Comments: Shoulder flexion to 90*, able to incorporate into self-care tasks.  Difficult to formally assess due to aphasia General Strength Comments: grossly 4-/5, difficult to fully assess due to aphasia LUE Assessment LUE Assessment: Within Functional Limits General Strength Comments: grossly 4+/5     Refer to Care Plan for Long Term Goals  Recommendations for other services: None    Discharge Criteria: Patient will be discharged from OT if patient refuses treatment 3 consecutive times without medical reason, if treatment goals not met, if there is a change in medical status, if patient makes no progress towards goals or if patient is discharged from hospital.  The above assessment, treatment plan, treatment alternatives and goals were discussed and mutually agreed upon: by patient and by family  Ellwood Dense [name] 12/08/2018, 4:05 PM

## 2018-12-08 NOTE — Evaluation (Addendum)
Speech Language Pathology Assessment and Plan  Patient Details  Name: [name] MRN: [id number] Date of Birth: 13-May-1922  SLP Diagnosis: Aphasia  Rehab Potential: Poor ELOS: 2 to 3 weeks    Today's Date: 12/08/2018 SLP Individual Time: 1100-1200 SLP Individual Time Calculation (min): 60 min   Problem List:  Patient Active Problem List   Diagnosis Date Noted  . Acute right ACA stroke ([place]) 12/07/2018  . Stroke (cerebrum) ([place]) 12/03/2018  . Cough 10/11/2018  . Abnormal breath sounds 10/11/2018  . Closed nondisplaced fracture of proximal phalanx of lesser toe of right foot 06/26/2018  . Right ankle pain 06/21/2018  . Right foot pain 06/21/2018  . Abnormal CXR 04/26/2017  . Lobar pneumonia ([place]) 01/20/2017  . Acute on chronic respiratory failure with hypoxia ([place]) 01/17/2017  . Acute respiratory failure with hypoxia ([place]) 01/17/2017  . DOE (dyspnea on exertion) 10/23/2014  . Hyponatremia 10/23/2014  . RLL pneumonia ([place]) 10/11/2013  . Other specified anemias 10/10/2013  . Acute respiratory failure ([place]) 08/15/2013  . Aspiration pneumonia ([place]) 08/15/2013  . Altered mental status 08/15/2013  . Incarcerated femoral hernia 08/14/2013  . Esophageal reflux 08/05/2013  . Unspecified constipation 07/03/2013  . Closed fracture of left hip ([place]) 07/01/2013  . 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 Atrial fibrillation ([place]) 10/30/2012  . Low back pain 10/26/2012  . Syncope 10/26/2012  . PNA (pneumonia) 09/26/2012  . UTI (lower urinary tract infection) 09/26/2012  . Hypokalemia 09/26/2012  . Leukocytosis 09/26/2012  . Cardiac enzymes elevated 07/25/2011  . Impaired glucose tolerance 06/03/2011  . Preventative health care 06/03/2011  . IRON DEFICIENCY 12/06/2010  . 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 CVA (cerebral vascular accident) ([place]) 01/09/2009  . Aphasia 01/09/2009  . Urinary incontinence 01/09/2009  . PACEMAKER, PERMANENT 01/09/2009  .  Dementia ([place]) 05/19/2008  . Hypothyroidism 11/20/2007  . Hyperlipidemia 11/20/2007  . Essential hypertension 11/20/2007  . DIVERTICULOSIS, COLON 11/20/2007  . FATIGUE 11/20/2007  . CEREBROVASCULAR ACCIDENT, HX OF 11/20/2007  . TIA 05/14/2003   Past Medical History:  Past Medical History:  Diagnosis Date  . Aphasia 01/09/2009  . Arthritis    right hand  . 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 CEREBROVASCULAR ACCIDENT, HX OF 11/20/2007  . CHF (congestive heart failure) ([place])   . Coronary artery disease   . CVA 01/09/2009  . DEMENTIA 05/19/2008  . Dementia ([place])   . DIVERTICULOSIS, COLON 11/20/2007  . FATIGUE 11/20/2007  . Femoral fracture ([place]) 07/01/2013  . GLUCOSE INTOLERANCE 12/06/2010  . HYPERLIPIDEMIA 11/20/2007  . HYPERTENSION 11/20/2007   Echo was done 07/07/11: mod LVH, EF 55-60%, grade 1 diast dysfxn, mild MR, mild LAE, mod TR, PASP 39  . HYPOTHYROIDISM 11/20/2007  . Impaired glucose tolerance 06/03/2011  . IRON DEFICIENCY 12/06/2010  . Pacemaker 2002  . PACEMAKER, PERMANENT 01/09/2009  . TIA 05/14/2003   elevated CE's in setting of TIA and falls in 10/12 - echo normal with no further cardiac w/u pursued  . TIA (transient ischemic attack) 10/10/2011  . Urinary incontinence 01/09/2009   Past Surgical History:  Past Surgical History:  Procedure Laterality Date  . ABDOMINAL HYSTERECTOMY    . CHOLECYSTECTOMY    . CYSTOCELE REPAIR N/A 11/26/2012   Procedure: Cystocele Repair with Lefort Colpocleisis.  colpectomy Levatorplasty Perineorraphy;  Surgeon: Lovenia Kim, MD;  Location: [facility];  Service: Gynecology;  Laterality: N/A;  Cystocele Repair with Lefort Colpocleisis.  Marland Kitchen HIP ARTHROPLASTY Left 07/02/2013   Procedure: ARTHROPLASTY BIPOLAR HIP;  Surgeon: Mauri Pole, MD;  Location: [facility];  Service: Orthopedics;  Laterality: Left;  . INGUINAL HERNIA REPAIR Right 08/13/2013   Procedure: HERNIA REPAIR INGUINAL INCARCERATED;  Surgeon: Ralene Ok, MD;  Location: [facility];  Service:  General;  Laterality: Right;  . Medtronic Cynthia dual chamber pacemaker serial number was [id number] H...04/15/2009    . s/p cerebral aneurysm  1991  . s/p pacemaker DDD    . s/p retinal attachment    . s/p thyroidectomy      Assessment / Plan / Recommendation Clinical Impression [name] Caserta Coltraneis a83 year oldRH-female with history of stroke with mild aphasia, aneurysm clipping 1993, ocular stroke 2005, AV block s/p PPM,mild dementia who was admitted on 12/03/2018 after being found with right-sided weakness and aphasia. CTA head neck showed emergent left A2 occlusion with poor collateralization and severe left and moderate right supraclinoid ICA stenosis, occluded left MCA at aneurysm clip with intermediate collaterals and 3.9 cm ascending aortic aneurysm. She received IV tPA and Dr.Xufeltthatstroke embolic due to A. Fib.Follow up CT head showed evolving acute left frontal/ACA territory infarct with new petechial hemorrhage and old large L-MCA territory infarct. ST exam revealed mixed dysfluent aphasia with slow speech and perseveratory motor responses. She was placed on dysphagia 1, thins liquids. She is able to follow simple commands with increased time and working of pregait activity. CIR recommended due to functional decline.Pt admited to CIR on 12/07/18.   [name] (pt's daughter-in-law) present and provides the following information:  Pt resided in [employer] but had assistance with bathing, dressing, meal reminders and they administered medications. Pt walked to dining room and ate meals provided by facility. Was consuming regular diet with thin liquids. At baseline, pt with moderate to severe expessive aphasia c/b able to express her wants/needs ~ 25 to 50% of time using rote phrases and simple phrases. She was not able to provide information about herself to family. She was able to name common items when these items were present (i.e., she could name toothpaste,  toothbrush but wasn't able to convey information to her family that she needs another toothbrush - she would say " I need a thing"). She was not able to read and is very [facility] but is also very distracted and anxious by the amphlification of all noises with hearing aids. Therefore she prefers to remain in her apartment. She also had difficulties with general problem solving and would not engage in any games with family. Cognitive deficits from dementia include placing dirty clothes back into closet instead of in hamper for daughter-in-law to wash. She has started confusing known events, is not able to recall information (such as what she ate for breakfast, if son has visited her even 10 minutes earlier) and she has started exhibiting times that she becomes distraught that her loved ones have died. For example, when [name] (daughter-in-law) is present pt will continue to distraughtly state "[name] is dead, Rebeccca has died." Daughter-in-law reports that pt requires extensive support and verbal cues "I am [name] and I am here, I am not dead etc."    Acute deficits include dysfluent speech at the word level with some intermittent groping noted. At this time pt can name common objects with ~ 50% accuracy with errors related to phonemic and perseveratory patterns. Pt required Max A shift in set of words and context to break perseveartory patterns. Pt also demonstrates acute increased processing times (~ 5 seconds). Overall pt is not accurate with yes/no questions and per report  is it likely that family only asks "yes" questions at baseline. At baseline she was able to follow 1 step directions that were likely context dependent but now pt is not able to perform such tasks.  Additionally, pt was placed on dysphagia 1 with thin liquids d/t cardiac and respiratory issues during acute hospitalization. She is currently consuming this diet without overt s/s of aspiration or dysphagia. Dysphagia therapy is indicated to  advance diet. Skilled ST is indicated to return pt to baseline communication but therapy must be functional to pt's previous abilities and will likely need to be context driven. Anticipate that pt will likely require SNF placement at discharge from CIR.    Skilled Therapeutic Interventions          Skilled treatment session focused on completion of above mentioned assessments and education with pt's daughter-in-law on POC. ST goals to target fluent naming of common objects and people known to pt. Pt was able to verbally repeat choices for meals when given 2 verbal choices. Would recommend targeting timely accurate response to such activity in this setting as well as following 1 step (contextually dependent) instructions.   SLP Assessment  Patient will need skilled Speech Lanaguage Pathology Services during CIR admission    Recommendations  SLP Diet Recommendations: Dysphagia 1 (Puree);Thin Liquid Administration via: Cup Medication Administration: Whole meds with puree Supervision: Full supervision/cueing for compensatory strategies;Staff to assist with self feeding Compensations: Minimize environmental distractions;Slow rate;Small sips/bites Postural Changes and/or Swallow Maneuvers: Seated upright 90 degrees Oral Care Recommendations: Oral care BID Patient destination: [facility] (SNF) Follow up Recommendations: Skilled Nursing facility;24 hour supervision/assistance Equipment Recommended: None recommended by SLP    SLP Frequency 3 to 5 out of 7 days   SLP Duration  SLP Intensity  SLP Treatment/Interventions 2 to 3 weeks  Minumum of 1-2 x/day, 30 to 90 minutes  Patient/family education;Functional tasks;Speech/Language facilitation;Dysphagia/aspiration precaution training    Pain Pain Assessment Pain Scale: 0-10 Pain Score: 0-No pain Faces Pain Scale: No hurt  Prior Functioning Type of Home: Independent living facility([facility])  Lives With: Alone Available Help  at Discharge: Personal care attendant;Available 24 hours/day  Short Term Goals: Week 1: SLP Short Term Goal 1 (Week 1): Given verbal choice of 2 items, pt will verbalize choice in timely manner with Mod A cues in 5 out of 10 opportunities.  SLP Short Term Goal 2 (Week 1): Given verbal choice of 2 items, pt will produce correct word intelligibility in 5 out of 10 opportunities with Mod A cues.  SLP Short Term Goal 3 (Week 1): Given Max A cues, pt will follow 1 step directions in 5 out of 10 opportunties.  SLP Short Term Goal 4 (Week 1): Given Mod A cues, pt will name common familiar items accuracy in 8 out of 10 opportunities with Min A cues.  SLP Short Term Goal 5 (Week 1): Pt will consume current diet with minimal overt s/s of aspiration and superivsion cues for use of ocmpensatory swallow strategies.  SLP Short Term Goal 6 (Week 1): Pt will cosnume trials of dysphagia 2 with minimal overt s/s of aspiration or dyphagia with Min A cues over 3 sessions to demonstrate readiness for diet upgrade.   Refer to Care Plan for Long Term Goals  Recommendations for other services: None   Discharge Criteria: Patient will be discharged from SLP if patient refuses treatment 3 consecutive times without medical reason, if treatment goals not met, if there is a change in medical status,  if patient makes no progress towards goals or if patient is discharged from hospital.  The above assessment, treatment plan, treatment alternatives and goals were discussed and mutually agreed upon: by patient and by family  Jerelle Virden 12/08/2018, 2:21 PM

## 2018-12-08 NOTE — Progress Notes (Signed)
[facility] PHYSICAL MEDICINE & REHABILITATION PROGRESS NOTE   Subjective/Complaints: Pt resting when I arrived. Did not appear to be in distress  ROS: limited due to language/communication    Objective:   No results found. Recent Labs    12/07/18 0527 12/08/18 0643  WBC 8.5 9.4  HGB 14.0 14.5  HCT 40.7 43.3  PLT 249 263   Recent Labs    12/07/18 0527 12/08/18 0643  NA 135 136  K 3.5 3.4*  CL 103 104  CO2 24 21*  GLUCOSE 111* 109*  BUN 21 23  CREATININE 0.71 0.68  CALCIUM 8.1* 8.0*    Intake/Output Summary (Last 24 hours) at 12/08/2018 1102 Last data filed at 12/08/2018 0915 Gross per 24 hour  Intake 240 ml  Output -  Net 240 ml     Physical Exam: Vital Signs Blood pressure (!) 145/63, pulse 60, temperature 97.8 F (36.6 C), temperature source Oral, resp. rate 16, weight 64.5 kg, SpO2 96 %. Constitutional: No distress . Vital signs reviewed. HEENT: EOMI, oral membranes moist Neck: supple Cardiovascular: RRR without murmur. No JVD    Respiratory: CTA Bilaterally without wheezes or rales. Normal effort    GI: BS +, non-tender, non-distended  Neurological: She isalert. Expressive greater than receptive aphasia. Followed most simple commands although usually with delay. Expressed verbally in one word, two word answers on occasion. Apraxic. RUE and RLE 4-/5. LUE and LLE 4 to 4+/5. Senses pain in all 4s. May have right field cut--neuro exam unchanged Psych: a little anxious this am.    Assessment/Plan: 1. Functional deficits secondary to left frontal infarct which require 3+ hours per day of interdisciplinary therapy in a comprehensive inpatient rehab setting.  Physiatrist is providing close team supervision and 24 hour management of active medical problems listed below.  Physiatrist and rehab team continue to assess barriers to discharge/monitor patient progress toward functional and medical goals  Care Tool:  Bathing              Bathing assist        Upper Body Dressing/Undressing Upper body dressing        Upper body assist      Lower Body Dressing/Undressing Lower body dressing            Lower body assist       Toileting Toileting    Toileting assist Assist for toileting: Total Assistance - Patient < 25%     Transfers Chair/bed transfer  Transfers assist     Chair/bed transfer assist level: Total Assistance - Patient < 25%     Locomotion Ambulation   Ambulation assist              Walk 10 feet activity   Assist           Walk 50 feet activity   Assist           Walk 150 feet activity   Assist           Walk 10 feet on uneven surface  activity   Assist           Wheelchair     Assist               Wheelchair 50 feet with 2 turns activity    Assist            Wheelchair 150 feet activity     Assist          Medical Problem List and  Plan: 1.Functional declinesecondary toLeft frontal/ACA infarct.             -beginning therapies today 2. Antithrombotics: -DVT/anticoagulation:Pharmaceutical:Lovenox -antiplatelet therapy: Low dose ASA 3. Pain Management:Tylenol prn 4. Mood:LCSW to follow for evaluation and support. -antipsychotic agents: N/A 5. Neuropsych: This patientis not fullycapable of making decisions on onown behalf. 6. Skin/Wound Care:routine pressure relief measures. 7. Fluids/Electrolytes/Nutrition:encourage PO  -D1 thin liquids currently   -I personally reviewed the patient's labs today.     -sl hypokalemia--supplement   -otherwise labs stable 8. HTN: Monitor BP bid. Continue Amlodipine and Cozaar daily  -controlled at present 9. Dyslipidemia: On pravastatin.  10. 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     LOS: 1 days A FACE TO FACE EVALUATION WAS PERFORMED  Meredith Staggers 12/08/2018, 11:02 AM

## 2018-12-09 ENCOUNTER — Inpatient Hospital Stay (HOSPITAL_COMMUNITY): Payer: Medicare Other | Admitting: Speech Pathology

## 2018-12-09 NOTE — Progress Notes (Signed)
[facility] PHYSICAL MEDICINE & REHABILITATION PROGRESS NOTE   Subjective/Complaints: No new problems. Slept well per RN.   ROS: limited due to language/communication    Objective:   No results found. Recent Labs    12/07/18 0527 12/08/18 0643  WBC 8.5 9.4  HGB 14.0 14.5  HCT 40.7 43.3  PLT 249 263   Recent Labs    12/07/18 0527 12/08/18 0643  NA 135 136  K 3.5 3.4*  CL 103 104  CO2 24 21*  GLUCOSE 111* 109*  BUN 21 23  CREATININE 0.71 0.68  CALCIUM 8.1* 8.0*    Intake/Output Summary (Last 24 hours) at 12/09/2018 8325 Last data filed at 12/08/2018 1840 Gross per 24 hour  Intake 480 ml  Output -  Net 480 ml     Physical Exam: Vital Signs Blood pressure (!) 132/55, pulse (!) 59, temperature (!) 97.5 F (36.4 C), temperature source Oral, resp. rate 18, weight 64.5 kg, SpO2 96 %. Constitutional: No distress . Vital signs reviewed. HEENT: EOMI, oral membranes moist Neck: supple Cardiovascular: RRR without murmur. No JVD    Respiratory: CTA Bilaterally without wheezes or rales. Normal effort    GI: BS +, non-tender, non-distended  Neurological: She isalert. Expressive greater than receptive aphasia persists. Followed most simple commands although usually with delay. Expressed verbally in one word, two word answers on occasion. Apraxic. RUE and RLE 4-/5. LUE and LLE 4 to 4+/5. Senses pain in all 4s. May have right field cut--neuro exam stable Psych: remains anxious this am.    Assessment/Plan: 1. Functional deficits secondary to left frontal infarct which require 3+ hours per day of interdisciplinary therapy in a comprehensive inpatient rehab setting.  Physiatrist is providing close team supervision and 24 hour management of active medical problems listed below.  Physiatrist and rehab team continue to assess barriers to discharge/monitor patient progress toward functional and medical goals  Care Tool:  Bathing  Bathing activity did not occur:  Safety/medical concerns Body parts bathed by patient: Chest, Abdomen, Face   Body parts bathed by helper: Right arm, Left arm, Front perineal area, Buttocks, Right upper leg, Left upper leg, Right lower leg, Left lower leg     Bathing assist Assist Level: 2 Helpers     Upper Body Dressing/Undressing Upper body dressing   What is the patient wearing?: Button up shirt    Upper body assist Assist Level: Maximal Assistance - Patient 25 - 49%    Lower Body Dressing/Undressing Lower body dressing      What is the patient wearing?: Underwear/pull up     Lower body assist Assist for lower body dressing: Maximal Assistance - Patient 25 - 49%     Toileting Toileting    Toileting assist Assist for toileting: Total Assistance - Patient < 25%     Transfers Chair/bed transfer  Transfers assist     Chair/bed transfer assist level: Dependent - Patient 0%     Locomotion Ambulation   Ambulation assist   Ambulation activity did not occur: Safety/medical concerns          Walk 10 feet activity   Assist  Walk 10 feet activity did not occur: Safety/medical concerns        Walk 50 feet activity   Assist Walk 50 feet with 2 turns activity did not occur: Safety/medical concerns         Walk 150 feet activity   Assist Walk 150 feet activity did not occur: Safety/medical concerns  Walk 10 feet on uneven surface  activity   Assist Walk 10 feet on uneven surfaces activity did not occur: Safety/medical concerns         Wheelchair     Assist Will patient use wheelchair at discharge?: Yes Type of Wheelchair: Manual Wheelchair activity did not occur: Safety/medical concerns         Wheelchair 50 feet with 2 turns activity    Assist    Wheelchair 50 feet with 2 turns activity did not occur: Safety/medical concerns       Wheelchair 150 feet activity     Assist Wheelchair 150 feet activity did not occur: Safety/medical  concerns        Medical Problem List and Plan: 1.Functional declinesecondary toLeft frontal/ACA infarct.             --Continue CIR therapies including PT, OT, and SLP  2. Antithrombotics: -DVT/anticoagulation:Pharmaceutical:Lovenox -antiplatelet therapy: Low dose ASA 3. Pain Management:Tylenol prn 4. Mood:LCSW to follow for evaluation and support. -antipsychotic agents: N/A 5. Neuropsych: This patientis not fullycapable of making decisions on onown behalf. 6. Skin/Wound Care:routine pressure relief measures. 7. Fluids/Electrolytes/Nutrition:encourage PO  -D1 thin liquids currently   -sl hypokalemia--supplementing   8. HTN: Monitor BP bid. Continue Amlodipine and Cozaar daily  -controlled at present 3/15 9. Dyslipidemia: On pravastatin.  10. 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     LOS: 2 days A FACE TO FACE EVALUATION WAS PERFORMED  Meredith Staggers 12/09/2018, 9:37 AM

## 2018-12-10 ENCOUNTER — Inpatient Hospital Stay (HOSPITAL_COMMUNITY): Payer: Medicare Other

## 2018-12-10 ENCOUNTER — Other Ambulatory Visit: Payer: Self-pay

## 2018-12-10 ENCOUNTER — Inpatient Hospital Stay (HOSPITAL_COMMUNITY): Payer: Medicare Other | Admitting: Occupational Therapy

## 2018-12-10 ENCOUNTER — Encounter (HOSPITAL_COMMUNITY): Payer: Self-pay

## 2018-12-10 DIAGNOSIS — I69319 Unspecified symptoms and signs involving cognitive functions following cerebral infarction: Secondary | ICD-10-CM

## 2018-12-10 DIAGNOSIS — Z8659 Personal history of other mental and behavioral disorders: Secondary | ICD-10-CM

## 2018-12-10 DIAGNOSIS — I63521 Cerebral infarction due to unspecified occlusion or stenosis of right anterior cerebral artery: Secondary | ICD-10-CM

## 2018-12-10 MED ORDER — INFLUENZA VAC SPLIT HIGH-DOSE 0.5 ML IM SUSY
0.5000 mL | PREFILLED_SYRINGE | INTRAMUSCULAR | Status: AC
  Administered 2018-12-11: 0.5 mL via INTRAMUSCULAR
  Filled 2018-12-10: qty 0.5

## 2018-12-10 NOTE — Progress Notes (Signed)
Occupational Therapy Session Note  Patient Details  Name: [name] MRN: [id number] Date of Birth: [birth year]  Today's Date: 12/10/2018 OT Individual Time: 1100-1200 OT Individual Time Calculation (min): 60 min    Short Term Goals: Week 1:  OT Short Term Goal 1 (Week 1): Pt will tolerate OOB activity for 30 mins with 2 rest breaks to demonstrate increased activity tolerance OT Short Term Goal 2 (Week 1): Pt will complete bathing with max assist of 1 caregiver OT Short Term Goal 3 (Week 1): Pt will completed UB dressing with mod assist OT Short Term Goal 4 (Week 1): Pt will complete LB dressing with max assist of 1 caregiver  Skilled Therapeutic Interventions/Progress Updates:    Upon entering the room, pt supine in bed with no c/o,signs, or symptoms of pain. Pt agreeable to OT intervention. Supine >sit with min A for trunk elevation to EOB. Pt needing min - max A static sitting balance secondary to fatigue. Pt donning UB clothing with mod A and mod A for sitting balance as pt with significant posterior lean once involved in activity. Pt standing x 2 on EOB with max A. Max A stand pivot transfer with OT placing hand on wheelchair and waiting for pt to initiate and sequence the movement with increased time. Pt seated in wheelchair at sink with focus on grooming tasks with forced use of B UEs for coordination task, increased time, and min cuing for sequencing. Pt remained in wheelchair at end of session with alarm belt donned for safety and call bell within reach.   Therapy Documentation Precautions:  Precautions Precautions: Fall Restrictions Weight Bearing Restrictions: No General:   Vital Signs: Therapy Vitals BP: (!) 133/53 Pain: Pain Assessment Pain Score: 0-No pain ADL: ADL Grooming: Minimal assistance Where Assessed-Grooming: Sitting at sink Upper Body Bathing: Moderate assistance Where Assessed-Upper Body Bathing: Sitting at sink, Standing at sink Lower Body  Bathing: Dependent(+2) Where Assessed-Lower Body Bathing: Standing at sink([place]) Upper Body Dressing: Maximal assistance Where Assessed-Upper Body Dressing: Sitting at sink Lower Body Dressing: Dependent(+2) Vision   Perception    Praxis   Exercises:   Other Treatments:     Therapy/Group: Individual Therapy  Gypsy Decant 12/10/2018, 12:41 PM

## 2018-12-10 NOTE — Progress Notes (Signed)
Inpatient Rehabilitation  Patient information reviewed and entered into eRehab system by Ziv Welchel M. Jozeph Persing, M.A., CCC/SLP, PPS Coordinator.  Information including medical coding, functional ability and quality indicators will be reviewed and updated through discharge.    Per nursing patient was given "Data Collection Information Summary" for Patients in Inpatient Rehabilitation Facilities with attached "Privacy Act Statement-Health Care Records" upon admission.   

## 2018-12-10 NOTE — Progress Notes (Signed)
[place] PHYSICAL MEDICINE & REHABILITATION PROGRESS NOTE   Subjective/Complaints:   Working with SLP, trial of D3 successful, on D1 due to reduced mastication Pt oriented to person and place, some inappropriate laughter  ROS: limited due to language/communication    Objective:   No results found. Recent Labs    12/08/18 0643  WBC 9.4  HGB 14.5  HCT 43.3  PLT 263   Recent Labs    12/08/18 0643  NA 136  K 3.4*  CL 104  CO2 21*  GLUCOSE 109*  BUN 23  CREATININE 0.68  CALCIUM 8.0*    Intake/Output Summary (Last 24 hours) at 12/10/2018 0848 Last data filed at 12/09/2018 1812 Gross per 24 hour  Intake 700 ml  Output -  Net 700 ml     Physical Exam: Vital Signs Blood pressure (!) 135/57, pulse 60, temperature 98 F (36.7 C), temperature source Oral, resp. rate 18, weight 64.5 kg, SpO2 93 %. Constitutional: No distress . Vital signs reviewed. HEENT: EOMI, oral membranes moist Neck: supple Cardiovascular: RRR without murmur. No JVD    Respiratory: CTA Bilaterally without wheezes or rales. Normal effort    GI: BS +, non-tender, non-distended  Neurological: She isalert. Expressive greater than receptive aphasia persists. Followed most simple commands although usually with delay. Expressed verbally in one word, two word answers on occasion. Apraxic. RUE 4/5 and 3- RLE  LUE and LLE 4 to 4+/5. Senses pain in all 4s. May have right field cut--neuro exam stable Psych: remains anxious this am.    Assessment/Plan: 1. Functional deficits secondary to left frontal infarct which require 3+ hours per day of interdisciplinary therapy in a comprehensive inpatient rehab setting.  Physiatrist is providing close team supervision and 24 hour management of active medical problems listed below.  Physiatrist and rehab team continue to assess barriers to discharge/monitor patient progress toward functional and medical goals  Care Tool:  Bathing  Bathing activity did not  occur: Safety/medical concerns Body parts bathed by patient: Chest, Abdomen, Face   Body parts bathed by helper: Right arm, Left arm, Front perineal area, Buttocks, Right upper leg, Left upper leg, Right lower leg, Left lower leg     Bathing assist Assist Level: 2 Helpers     Upper Body Dressing/Undressing Upper body dressing   What is the patient wearing?: Hospital gown only    Upper body assist Assist Level: Maximal Assistance - Patient 25 - 49%    Lower Body Dressing/Undressing Lower body dressing      What is the patient wearing?: Incontinence brief     Lower body assist Assist for lower body dressing: Total Assistance - Patient < 25%     Toileting Toileting    Toileting assist Assist for toileting: 2 Helpers     Transfers Chair/bed transfer  Transfers assist     Chair/bed transfer assist level: 2 Helpers     Locomotion Ambulation   Ambulation assist   Ambulation activity did not occur: Safety/medical concerns          Walk 10 feet activity   Assist  Walk 10 feet activity did not occur: Safety/medical concerns        Walk 50 feet activity   Assist Walk 50 feet with 2 turns activity did not occur: Safety/medical concerns         Walk 150 feet activity   Assist Walk 150 feet activity did not occur: Safety/medical concerns         Walk 10 feet  on uneven surface  activity   Assist Walk 10 feet on uneven surfaces activity did not occur: Safety/medical concerns         Wheelchair     Assist Will patient use wheelchair at discharge?: Yes Type of Wheelchair: Manual Wheelchair activity did not occur: Safety/medical concerns         Wheelchair 50 feet with 2 turns activity    Assist    Wheelchair 50 feet with 2 turns activity did not occur: Safety/medical concerns       Wheelchair 150 feet activity     Assist Wheelchair 150 feet activity did not occur: Safety/medical concerns        Medical Problem  List and Plan: 1.Functional declinesecondary toLeft frontal/ACA infarct.             --Continue CIR therapies including PT, OT, and SLP  2. Antithrombotics: -DVT/anticoagulation:Pharmaceutical:Lovenox -antiplatelet therapy: Low dose ASA 3. Pain Management:Tylenol prn 4. Mood:LCSW to follow for evaluation and support. -antipsychotic agents: N/A 5. Neuropsych: This patientis not fullycapable of making decisions on onown behalf. 6. Skin/Wound Care:routine pressure relief measures. 7. Fluids/Electrolytes/Nutrition:encourage PO  -D1 thin liquids currently   -sl hypokalemia--supplementing   8. HTN: Monitor BP bid. Continue Amlodipine and Cozaar daily  -controlled at present 3/15 Vitals:   12/09/18 1840 12/10/18 0417  BP: (!) 135/51 (!) 135/57  Pulse: 60 60  Resp: 18 18  Temp: 98.6 F (37 C) 98 F (36.7 C)  SpO2: 95% 93%   9. Dyslipidemia: On pravastatin.  10. 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   11. Mild hypo K supplement orally  LOS: 3 days A FACE TO FACE EVALUATION WAS PERFORMED  [name] 12/10/2018, 8:48 AM

## 2018-12-10 NOTE — Progress Notes (Signed)
Speech Language Pathology Daily Session Note  Patient Details  Name: [name] MRN: [id number] Date of Birth: 1922-03-17  Today's Date: 12/10/2018 SLP Individual Time: 0802-0900 SLP Individual Time Calculation (min): 58 min  Short Term Goals: Week 1: SLP Short Term Goal 1 (Week 1): Given verbal choice of 2 items, pt will verbalize choice in timely manner with Mod A cues in 5 out of 10 opportunities.  SLP Short Term Goal 2 (Week 1): Given verbal choice of 2 items, pt will produce correct word intelligibility in 5 out of 10 opportunities with Mod A cues.  SLP Short Term Goal 3 (Week 1): Given Max A cues, pt will follow 1 step directions in 5 out of 10 opportunties.  SLP Short Term Goal 4 (Week 1): Given Mod A cues, pt will name common familiar items accuracy in 8 out of 10 opportunities with Min A cues.  SLP Short Term Goal 5 (Week 1): Pt will consume current diet with minimal overt s/s of aspiration and superivsion cues for use of ocmpensatory swallow strategies.  SLP Short Term Goal 6 (Week 1): Pt will cosnume trials of dysphagia 2 with minimal overt s/s of aspiration or dyphagia with Min A cues over 3 sessions to demonstrate readiness for diet upgrade.   Skilled Therapeutic Interventions:Skilled ST services focused on swallow and language skills. SLP facilitated PO consumption of dys 1 and thin via cup breakfast tray, pt required mod physical assistance navigating tray and putting food on spoon/fork, with mild oral residue, requiring mod A verbal cues for use of liquid wash and second swallow. SLP facilitated PO consumption trial of dys 2 snack, pt demonstrated ability to self feed, slightly prolonged mastication time, however demonstrated appropriate oral clearnace and no overt s/s aspiration. Pt demonstrated ability to express wants/needs during breakfast in given two verbal and visual choices of items with min A verbal cues in 5 out 5 opportunities. SLP facilitated ability to verbalize  wants/needs given a choice of two menu items, pt always verbalized last item, demonstrating preservation, however was able to correct choice when presented with a disliked item last ("fish") indicted by facial grimance and stated "Fish. No Tukey"." Pt demonstrated an averaged of 3-4 seconds in response time in response to verbal questions. Pt demonstrated word intelligibility at word level (1-2 at a time) with 80% intelligibility. SLP facilitated ability to follow 1 step directions, sorting pegs by color given initial demonstration and SLP hand pt each block to initiate task, pt demonstrated ability to sort colors by 2 and 3 fields. Pt was left in room with call bell within reach and bed alarm set. Recommend to continue skilled ST services.      Pain Pain Assessment Pain Score: 0-No pain  Therapy/Group: Individual Therapy  Lonnette Shrode  [facility] 12/10/2018, 10:41 AM

## 2018-12-10 NOTE — IPOC Note (Signed)
Overall Plan of Care [facility]) Patient Details Name: [name] MRN: [id number] DOB: 13-Jun-1922  Admitting Diagnosis: <principal problem not specified>  Hospital Problems: Active Problems:   Acute right ACA stroke ([place])     Functional Problem List: Nursing Bladder, Bowel, Endurance, Medication Management, Motor, Nutrition, Safety  PT Balance, Endurance, Motor, Perception, Safety  OT Balance, Cognition, Endurance, Motor, Pain, Perception, Safety, Sensory, Vision  SLP Linguistic, Nutrition  TR         Basic ADL's: OT Eating, Bathing, Grooming, Dressing, Toileting     Advanced  ADL's: OT       Transfers: PT Bed Mobility, Bed to Chair, Car, Furniture, Floor  OT Toilet, Metallurgist: PT Ambulation, Emergency planning/management officer, Stairs     Additional Impairments: OT Fuctional Use of Upper Extremity  SLP Swallowing, Communication expression, comprehension    TR      Anticipated Outcomes Item Anticipated Outcome  Self Feeding Supervision  Swallowing  Min A with least restrictive diet   Basic self-care  Min assist  Toileting  Min assist   Bathroom Transfers Min assist  Bowel/Bladder  Pt will manage bowel and bladder with mod assist at discharge   Transfers  min assist  Locomotion  min assist short distance gait w/ LRAD  Communication  Min A to return to baseline (moderate to severe expressive deficits)  Cognition     Pain  Pt will manage pain at 3 out of 10  Safety/Judgment  Pt will remain free of falls with injury while in rehab with min assist    Therapy Plan: PT Intensity: Minimum of 1-2 x/day ,45 to 90 minutes PT Frequency: 5 out of 7 days PT Duration Estimated Length of Stay: 21-24 days OT Intensity: Minimum of 1-2 x/day, 45 to 90 minutes OT Frequency: 5 out of 7 days OT Duration/Estimated Length of Stay: 21-24 days SLP Intensity: Minumum of 1-2 x/day, 30 to 90 minutes SLP Frequency: 3 to 5 out of 7 days SLP Duration/Estimated Length of  Stay: 2 to 3 weeks    Team Interventions: Nursing Interventions Patient/Family Education, Bladder Management, Bowel Management, Disease Management/Prevention, Dysphagia/Aspiration Precaution Training, Pain Management, Medication Management, Cognitive Remediation/Compensation, Discharge Planning  PT interventions Ambulation/gait training, Discharge planning, Cognitive remediation/compensation, DME/adaptive equipment instruction, Functional mobility training, Pain management, Psychosocial support, Splinting/orthotics, Therapeutic Activities, UE/LE Strength taining/ROM, Visual/perceptual remediation/compensation, UE/LE Coordination activities, Wheelchair propulsion/positioning, Stair training, Therapeutic Exercise, Patient/family education, Neuromuscular re-education, Skin care/wound management, Functional electrical stimulation, Disease management/prevention, Academic librarian, Training and development officer  OT Interventions Training and development officer, Cognitive remediation/compensation, Discharge planning, Disease mangement/prevention, DME/adaptive equipment instruction, Functional mobility training, Neuromuscular re-education, Pain management, Patient/family education, Psychosocial support, Self Care/advanced ADL retraining, Skin care/wound managment, Splinting/orthotics, Therapeutic Activities, Therapeutic Exercise, UE/LE Strength taining/ROM, UE/LE Coordination activities, Visual/perceptual remediation/compensation, Wheelchair propulsion/positioning  SLP Interventions Patient/family education, Functional tasks, Speech/Language facilitation, Dysphagia/aspiration precaution training  TR Interventions    SW/CM Interventions Discharge Planning, Psychosocial Support, Patient/Family Education   Barriers to Discharge MD  Medical stability and baseline dementia  Nursing      PT Other (comments) baseline cognitive deficits and aphasia  OT Decreased caregiver support    SLP Other  (comments)(PLOF) PLOF  SW       Team Discharge Planning: Destination: PT-Home(ILF) ,OT- Home , SLP-Skilled Nursing Facility (SNF) Projected Follow-up: PT-Home health PT(lives in ILF), OT-  Home health OT, 24 hour supervision/assistance, SLP-Skilled Nursing facility, 24 hour supervision/assistance Projected Equipment Needs: PT-To be determined, OT- None recommended by OT(has all recommended equipment), SLP-None recommended  by SLP Equipment Details: PT-has rollator, no w/c, OT-  Patient/family involved in discharge planning: PT- Family member/caregiver, Patient,  OT-Patient, Family member/caregiver, SLP-Family member/caregiver  MD ELOS: 12-15d Medical Rehab Prognosis:  Fair Assessment:  66 year oldRH-female with history of stroke with mild aphasia, aneurysm clipping 1993, ocular stroke 2005, AV block s/p PPM,mild dementia who was admitted on 12/03/2018 after being found with right-sided weakness and aphasia. CTA head neck showed emergent left A2 occlusion with poor collateralization and severe left and moderate right supraclinoid ICA stenosis, occluded left MCA at aneurysm clip with intermediate collaterals and 3.9 cm ascending aortic aneurysm.. She received IV tPA and Dr.Xufeltthatstroke embolic due to A. Fib.Follow up CT head showed evolving acute left frontal/ACA territory infarct with new petechial hemorrhage and old large L-MCA territory infarct. She was cleared to start ASA yesterday with recommendations to repeat CT in about a week and decide on DOAC based on CT findings.  2D echo done showing EF 55% with moderate increase left ventricular wall thickness, severely dilated left atrium moderate TVR, moderate calcification of mitral valve and aortic valve.She did have issues with mild respiratory distress due to fluid overload therefore IVF discontinued. ST exam revealed mixed dysfluent aphasia with slow speech and perseveratory motor responses She was placed on dysphagia 1, thins  liquids   Now requiring 24/7 Rehab RN,MD, as well as CIR level PT, OT and SLP.  Treatment team will focus on ADLs and mobility with goals set at [facility] A See Team Conference Notes for weekly updates to the plan of care

## 2018-12-10 NOTE — Progress Notes (Signed)
Physical Therapy Session Note  Patient Details  Name: [name] MRN: [id number] Date of Birth: [birth year]  Today's Date: 12/10/2018 PT Individual Time: 0915-1030 PT Individual Time Calculation (min): 75 min   Short Term Goals: Week 1:  PT Short Term Goal 1 (Week 1): Pt will transfer bed<>chair w/ mod assist +1 PT Short Term Goal 2 (Week 1): Pt will initiate gait training PT Short Term Goal 3 (Week 1): Pt will initiate w/c mobility training PT Short Term Goal 4 (Week 1): Pt will achieve midline orientation w/ cues 50% of the time   Skilled Therapeutic Interventions/Progress Updates:    Pt supine in bed upon PT arrival, agreeable to therapy tx and denies pain at rest. RN present to administer pain medicine while therapist retrieved cushion for w/c. RN suggested that the pt may have to use the bathroom this session, pt says yes when asked if she has to go to the bathroom. RN Nurse, adult) also notified this PT that transfers are not working well with this pt and this PT will provide guidance after this session. Pt transferred to sitting with max assist. Pt maintains sitting balance with CGA, increased R lateral and posteior lean noted. Attempted to use stedy this session, but pt unable to come to full standing despite max to total assist +2, pt also pushing posteriorly during attempt to stand and unable to correct with cues, grimacing in pain. Pt transferred to bedside commode squat pivot total assist +2. Pt able to lift hip off commode with +2 assist to doff brief. Pt continent of bowel and bladder this session, therapist providing max assist for anterior weightshift/forward lean in order for +2 to perform pericare total assist. Pt with increased work of breathing noted during transfers and sitting on commode. Pt transferred back to bed total assist +2 squat pivot. Pt transferred to supine max assist +2, performed rolling with mod assist +2 in order to don clean brief and pants. Pt transferred back  to sitting EOB with max assist. Remainder of session focused on sitting balance and anterior weightshifting in sitting to perform reaching tasks with bean bags and horseshoes. Pt performed reaching task to pick up horseshoes from the floor, min assist. Pt performed sit<>sidelying on elbow x 5 in each direction with CGA. Pt transferred back to supine and left with needs in reach and bed alarm set.   Therapy Documentation Precautions:  Precautions Precautions: Fall Restrictions Weight Bearing Restrictions: No    Therapy/Group: Individual Therapy  Netta Corrigan, PT, DPT 12/10/2018, 10:19 AM

## 2018-12-10 NOTE — Progress Notes (Signed)
Pt was medicated for general aching pain with tylenol. Pt was incontinent of urine and given incontinence care. Pt has expressive aphasia but can make needs known. Resting in bed call light in reach.

## 2018-12-10 NOTE — Progress Notes (Deleted)
[place] PHYSICAL MEDICINE & REHABILITATION PROGRESS NOTE   Subjective/Complaints: Patient smiles working with speech therapy.  Is cooperative  ROS: limited due to language/communication    Objective:   No results found. Recent Labs    12/08/18 0643  WBC 9.4  HGB 14.5  HCT 43.3  PLT 263   Recent Labs    12/08/18 0643  NA 136  K 3.4*  CL 104  CO2 21*  GLUCOSE 109*  BUN 23  CREATININE 0.68  CALCIUM 8.0*    Intake/Output Summary (Last 24 hours) at 12/10/2018 0815 Last data filed at 12/09/2018 1812 Gross per 24 hour  Intake 700 ml  Output -  Net 700 ml     Physical Exam: Vital Signs Blood pressure (!) 135/57, pulse 60, temperature 98 F (36.7 C), temperature source Oral, resp. rate 18, weight 64.5 kg, SpO2 93 %. Constitutional: No distress . Vital signs reviewed. HEENT: EOMI, oral membranes moist Neck: supple Cardiovascular: RRR without murmur. No JVD    Respiratory: CTA Bilaterally without wheezes or rales. Normal effort    GI: BS +, non-tender, non-distended  Neurological: She isalert. Expressive greater than receptive aphasia persists. Followed most simple commands although usually with delay. Expressed verbally in one word, two word answers on occasion. Apraxic. RUE 4-/5 and RLE 3-/5. LUE and LLE 4 to 4+/5. Senses pain in all 4s.  Psych: Patient smiling while interacting with speech therapy this morning.    Assessment/Plan: 1. Functional deficits secondary to left frontal infarct which require 3+ hours per day of interdisciplinary therapy in a comprehensive inpatient rehab setting.  Physiatrist is providing close team supervision and 24 hour management of active medical problems listed below.  Physiatrist and rehab team continue to assess barriers to discharge/monitor patient progress toward functional and medical goals  Care Tool:  Bathing  Bathing activity did not occur: Safety/medical concerns Body parts bathed by patient: Chest, Abdomen, Face    Body parts bathed by helper: Right arm, Left arm, Front perineal area, Buttocks, Right upper leg, Left upper leg, Right lower leg, Left lower leg     Bathing assist Assist Level: 2 Helpers     Upper Body Dressing/Undressing Upper body dressing   What is the patient wearing?: Hospital gown only    Upper body assist Assist Level: Maximal Assistance - Patient 25 - 49%    Lower Body Dressing/Undressing Lower body dressing      What is the patient wearing?: Incontinence brief     Lower body assist Assist for lower body dressing: Total Assistance - Patient < 25%     Toileting Toileting    Toileting assist Assist for toileting: 2 Helpers     Transfers Chair/bed transfer  Transfers assist     Chair/bed transfer assist level: 2 Helpers     Locomotion Ambulation   Ambulation assist   Ambulation activity did not occur: Safety/medical concerns          Walk 10 feet activity   Assist  Walk 10 feet activity did not occur: Safety/medical concerns        Walk 50 feet activity   Assist Walk 50 feet with 2 turns activity did not occur: Safety/medical concerns         Walk 150 feet activity   Assist Walk 150 feet activity did not occur: Safety/medical concerns         Walk 10 feet on uneven surface  activity   Assist Walk 10 feet on uneven surfaces activity did not  occur: Safety/medical concerns         Wheelchair     Assist Will patient use wheelchair at discharge?: Yes Type of Wheelchair: Manual Wheelchair activity did not occur: Safety/medical concerns         Wheelchair 50 feet with 2 turns activity    Assist    Wheelchair 50 feet with 2 turns activity did not occur: Safety/medical concerns       Wheelchair 150 feet activity     Assist Wheelchair 150 feet activity did not occur: Safety/medical concerns        Medical Problem List and Plan: 1.Functional declinesecondary toLeft frontal/ACA infarct.              --Continue CIR therapies including PT, OT, and SLP, 2. Antithrombotics: -DVT/anticoagulation:Pharmaceutical:Lovenox no evidence of lower extremity swelling -antiplatelet therapy: Low dose ASA 3. Pain Management:Tylenol prn 4. Mood:LCSW to follow for evaluation and support. -antipsychotic agents: N/A 5. Neuropsych: This patientis not fullycapable of making decisions on onown behalf. 6. Skin/Wound Care:routine pressure relief measures. 7. Fluids/Electrolytes/Nutrition:encourage PO  -D1 thin liquids currently, may potentially upgrade to D3 according to speech therapy  -sl hypokalemia--supplementing   8. HTN: Monitor BP bid. Continue Amlodipine and Cozaar daily  -controlled at present 3/16 Vitals:   12/09/18 1840 12/10/18 0417  BP: (!) 135/51 (!) 135/57  Pulse: 60 60  Resp: 18 18  Temp: 98.6 F (37 C) 98 F (36.7 C)  SpO2: 95% 93%   9. Dyslipidemia: On pravastatin.  10. 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     LOS: 3 days A FACE TO [place] E Efosa Treichler 12/10/2018, 8:15 AM

## 2018-12-11 ENCOUNTER — Inpatient Hospital Stay (HOSPITAL_COMMUNITY): Payer: Medicare Other | Admitting: Occupational Therapy

## 2018-12-11 ENCOUNTER — Inpatient Hospital Stay (HOSPITAL_COMMUNITY): Payer: Self-pay

## 2018-12-11 ENCOUNTER — Inpatient Hospital Stay (HOSPITAL_COMMUNITY): Payer: Medicare Other | Admitting: Speech Pathology

## 2018-12-11 MED ORDER — POTASSIUM CHLORIDE 20 MEQ/15ML (10%) PO SOLN
20.0000 meq | Freq: Every day | ORAL | Status: DC
  Administered 2018-12-11 – 2018-12-26 (×16): 20 meq via ORAL
  Filled 2018-12-11 (×17): qty 15

## 2018-12-11 MED ORDER — PANTOPRAZOLE SODIUM 40 MG PO PACK
40.0000 mg | PACK | Freq: Every day | ORAL | Status: DC
  Administered 2018-12-11 – 2018-12-25 (×15): 40 mg via ORAL
  Filled 2018-12-11 (×15): qty 20

## 2018-12-11 MED ORDER — ASPIRIN 81 MG PO CHEW
81.0000 mg | CHEWABLE_TABLET | Freq: Every day | ORAL | Status: DC
  Administered 2018-12-11 – 2018-12-26 (×16): 81 mg via ORAL
  Filled 2018-12-11 (×17): qty 1

## 2018-12-11 NOTE — Progress Notes (Addendum)
Was given in report that patient takes her medication whole in puree. When given, patient proceeded to chew her medication, including the protonix. After taking the first pill, she was instructed to swallow the next, but she still chewed it. Will contact pharmacy about changing some of meds to a crushable form.Will continue to monitor

## 2018-12-11 NOTE — Progress Notes (Signed)
Inpatient Rehabilitation Center Individual Statement of Services  Patient Name:  DARIONNA BANKE  Date:  12/11/2018  Welcome to the [place] [facility].  Our goal is to provide you with an individualized program based on your diagnosis and situation, designed to meet your specific needs.  With this comprehensive rehabilitation program, you will be expected to participate in at least 3 hours of rehabilitation therapies Monday-Friday, with modified therapy programming on the weekends.  Your rehabilitation program will include the following services:  Physical Therapy (PT), Occupational Therapy (OT), Speech Therapy (ST), 24 hour per day rehabilitation nursing, Case Management (Social Worker), Rehabilitation Medicine, Nutrition Services and Pharmacy Services  Weekly team conferences will be held on Wednesdays to discuss your progress.  Your Social Worker will talk with you frequently to get your input and to update you on team discussions.  Team conferences with you and your family in attendance may also be held.  Expected length of stay:  21 to 24 days  Overall anticipated outcome:  Minimal assistance  Depending on your progress and recovery, your program may change. Your Social Worker will coordinate services and will keep you informed of any changes. Your Social Worker's name and contact numbers are listed  below.  The following services may also be recommended but are not provided by the [facility] will be made to provide these services after discharge if needed.  Arrangements include referral to agencies that provide these services.  Your insurance has been verified to be:  Medicare and [employer] Your primary doctor is:  Dr. Cathlean Cower  Pertinent information will be shared with your doctor and your insurance company.  Social  Worker:  Alfonse Alpers, LCSW  [phone] or [phone]  Information discussed with and copy given to patient by: Trey Sailors, 12/11/2018, 12:02 PM

## 2018-12-11 NOTE — Progress Notes (Signed)
Occupational Therapy Session Note  Patient Details  Name: [name] MRN: [id number] Date of Birth: [birth year]  Today's Date: 12/11/2018 OT Individual Time: 1530-1600 OT Individual Time Calculation (min): 30 min    Short Term Goals: Week 1:  OT Short Term Goal 1 (Week 1): Pt will tolerate OOB activity for 30 mins with 2 rest breaks to demonstrate increased activity tolerance OT Short Term Goal 2 (Week 1): Pt will complete bathing with max assist of 1 caregiver OT Short Term Goal 3 (Week 1): Pt will completed UB dressing with mod assist OT Short Term Goal 4 (Week 1): Pt will complete LB dressing with max assist of 1 caregiver  Skilled Therapeutic Interventions/Progress Updates:    Upon entering the room, pt seated in wheelchair with no c/o,signs, or symptoms of pain. Pt was agreeable to OT intervention. Pt engaged in dynavision task for focus on anterior weight shift and visual scanning for R inattention. Pt utilizing B UE's to hit intended targets with an average of 2.5 seconds on L side of board and 4.8 seconds on R side of board. Pt engaged in sit <>stand from wheelchair x 3 reps with therapist positioned in front of pt and with mod multimodal cuing and increased time for initiation. Pt standing with max A and needing assist for lifting and lowering safely. OT assisted pt with returning to room via wheelchair. Pt requesting to remain in wheelchair. Chair alarm donned and call bell within reach. OT provided full supervision for pt to take small sips of drink. OT exited the room with all needs within reach.   Therapy Documentation Precautions:  Precautions Precautions: Fall Restrictions Weight Bearing Restrictions: No General:   Vital Signs: Therapy Vitals Temp: 98.7 F (37.1 C) Pulse Rate: (!) 58 Resp: 18 BP: 109/64 Patient Position (if appropriate): Sitting Oxygen Therapy SpO2: 97 % O2 Device: Room Air Pain: Pain Assessment Pain Scale: 0-10 Pain Score: 0-No  pain ADL: ADL Grooming: Minimal assistance Where Assessed-Grooming: Sitting at sink Upper Body Bathing: Moderate assistance Where Assessed-Upper Body Bathing: Sitting at sink, Standing at sink Lower Body Bathing: Dependent(+2) Where Assessed-Lower Body Bathing: Standing at sink(St[place]) Upper Body Dressing: Maximal assistance Where Assessed-Upper Body Dressing: Sitting at sink Lower Body Dressing: Dependent(+2)   Therapy/Group: Individual Therapy  Gypsy Decant 12/11/2018, 4:09 PM

## 2018-12-11 NOTE — Progress Notes (Signed)
Speech Language Pathology Daily Session Note  Patient Details  Name: [name] MRN: [id number] Date of Birth: 1922/05/24  Today's Date: 12/11/2018 SLP Individual Time: 0037-0488 SLP Individual Time Calculation (min): 43 min  Short Term Goals: Week 1: SLP Short Term Goal 1 (Week 1): Given verbal choice of 2 items, pt will verbalize choice in timely manner with Mod A cues in 5 out of 10 opportunities.  SLP Short Term Goal 2 (Week 1): Given verbal choice of 2 items, pt will produce correct word intelligibility in 5 out of 10 opportunities with Mod A cues.  SLP Short Term Goal 3 (Week 1): Given Max A cues, pt will follow 1 step directions in 5 out of 10 opportunties.  SLP Short Term Goal 4 (Week 1): Given Mod A cues, pt will name common familiar items accuracy in 8 out of 10 opportunities with Min A cues.  SLP Short Term Goal 5 (Week 1): Pt will consume current diet with minimal overt s/s of aspiration and superivsion cues for use of ocmpensatory swallow strategies.  SLP Short Term Goal 6 (Week 1): Pt will cosnume trials of dysphagia 2 with minimal overt s/s of aspiration or dyphagia with Min A cues over 3 sessions to demonstrate readiness for diet upgrade.   Skilled Therapeutic Interventions:  Pt was seen for skilled ST targeting goals for communication.  Pt was received in bed, awake, alert, and agreeable to participating in therapies.  Pt transferred to wheelchair via [place].  Pt was able to name basic, familiar objects for ~70% accuracy spontaneously but needed up to mod-max assist for naming those objects she initially missed.  Pt was also able to match object to descriptive phrase from a field of two for 100% accuracy with min-mod assist.  Pt was returned to room and left in wheelchair with chair alarm set and call bell left within reach.  Continue per current plan of care.    Pain Pain Assessment Pain Scale: 0-10 Pain Score: 0-No pain  Therapy/Group: Individual Therapy  Brunetta Newingham,  Selinda Orion 12/11/2018, 12:31 PM

## 2018-12-11 NOTE — Progress Notes (Signed)
Occupational Therapy Session Note  Patient Details  Name: [name] MRN: [id number] Date of Birth: 05/22/1922  Today's Date: 12/11/2018 OT Individual Time: 1302-1407 OT Individual Time Calculation (min): 65 min    Short Term Goals: Week 1:  OT Short Term Goal 1 (Week 1): Pt will tolerate OOB activity for 30 mins with 2 rest breaks to demonstrate increased activity tolerance OT Short Term Goal 2 (Week 1): Pt will complete bathing with max assist of 1 caregiver OT Short Term Goal 3 (Week 1): Pt will completed UB dressing with mod assist OT Short Term Goal 4 (Week 1): Pt will complete LB dressing with max assist of 1 caregiver  Skilled Therapeutic Interventions/Progress Updates:    Pt completed self feeding to start session with mod assist from wheelchair level.  Once completed she was taken down to the therapy gym where she transferred to the therapy mat with total assist stand pivot to the right side.  Increased pushing noted to the right side as well during transfer.  Once on the mat, she was able to maintain static sitting balance with close supervision.  Worked on sit to stand to begin, with pt demonstrating decreased forward trunk flexion for initiation of standing.  Transitioned to forward reaching and to the left slightly in preparation for sit to stand.  Total assist +2 (pt 30%) for sit to stand and standing for 60 seconds.  Increased trunk flexion and hip flexion noted with increased pushing to the right.  Noted strong smell of urine during standing with therapist asking pt if she needed to use the bathroom.   She shook her head yes.  Pt transferred back to the wheelchair with total assist +2 (pt 30%) squat pivot to the left.  Then therapist took her back to the room where she completed stand pivot toilet transfer with max assist to the right with use of the grab bar.  Total +2 (pt 30%) for managing clothing and completion of toilet hygiene.  She finished session with completion of  washing hands in sitting with min assist.  Pt left in the wheelchair with call button and phone in reach and safety belt in place.    Therapy Documentation Precautions:  Precautions Precautions: Fall Restrictions Weight Bearing Restrictions: No  Pain: Pain Assessment Pain Scale: Faces Faces Pain Scale: Hurts a little bit Pain Type: Acute pain Pain Location: Generalized Pain Descriptors / Indicators: Discomfort Pain Onset: With Activity Pain Intervention(s): Repositioned ADL: See Care Tool Section for some details of ADL  Therapy/Group: Individual Therapy  Emberly Tomasso OTR/L 12/11/2018, 4:51 PM

## 2018-12-11 NOTE — Progress Notes (Signed)
Physical Therapy Session Note  Patient Details  Name: [name] MRN: [id number] Date of Birth: Sep 16, 1922  Today's Date: 12/11/2018 PT Individual Time: 1000-1100 PT Individual Time Calculation (min): 60 min   Short Term Goals: Week 1:  PT Short Term Goal 1 (Week 1): Pt will transfer bed<>chair w/ mod assist +1 PT Short Term Goal 2 (Week 1): Pt will initiate gait training PT Short Term Goal 3 (Week 1): Pt will initiate w/c mobility training PT Short Term Goal 4 (Week 1): Pt will achieve midline orientation w/ cues 50% of the time   Skilled Therapeutic Interventions/Progress Updates:    Pt seated in w/c upon PT arrival, agreeable to therapy tx and no evidence of pain at rest, pt unable to report. Pt transported to the dayroom. Pt used standing frame x 6 minutes this session, while in standing pt worked on reaching tasks for bean bags. In standing frame pt with R lateral lean requiring cues and tactile facilitation to correct. Pt transported to gym. Pt performed total assist squat pivot transfer to the mat with max facilitation for anterior weightshift/trunk flexion. Pt seated edge of mat worked on reaching tasks to facilitate L anterior weightshift/leaning, CGA for sitting balance. At rest pt demonstrates R lateral lean in sitting. Using mirror for visual feedback pt able to self correct R lateral lean and come to midline. Pt performed x 2 sit<>stands this session with max assist +2 from the mat, using mirror for visual feedback, manual facilitation for increased hip and trunk extension in standing. Pt worked on continued sitting balance this session while performing reaching tasks with R UE. Pt performed squat pivot transfer to w/c with max assist +2, transported back to room and left in w/c with needs in reach and chair alarm set.   Therapy Documentation Precautions:  Precautions Precautions: Fall Restrictions Weight Bearing Restrictions: No    Therapy/Group: Individual  Therapy  Netta Corrigan, PT, DPT 12/11/2018, 7:55 AM

## 2018-12-11 NOTE — Progress Notes (Signed)
[facility] PHYSICAL MEDICINE & REHABILITATION PROGRESS NOTE   Subjective/Complaints:  No issues overnite   ROS: limited due to language/communication    Objective:   No results found. No results for input(s): WBC, HGB, HCT, PLT in the last 72 hours. No results for input(s): NA, K, CL, CO2, GLUCOSE, BUN, CREATININE, CALCIUM in the last 72 hours.  Intake/Output Summary (Last 24 hours) at 12/11/2018 0749 Last data filed at 12/10/2018 0900 Gross per 24 hour  Intake 120 ml  Output -  Net 120 ml     Physical Exam: Vital Signs Blood pressure (!) 142/58, pulse 60, temperature 98.3 F (36.8 C), temperature source Oral, resp. rate 12, weight 64.5 kg, SpO2 93 %. Constitutional: No distress . Vital signs reviewed. HEENT: EOMI, oral membranes moist Neck: supple Cardiovascular: RRR without murmur. No JVD    Respiratory: CTA Bilaterally without wheezes or rales. Normal effort    GI: BS +, non-tender, non-distended  Neurological: She isalert. Expressive greater than receptive aphasia persists. Followed most simple commands although usually with delay. Expressed verbally in one word, two word answers on occasion. Apraxic. RUE 4/5 and 3- RLE  LUE and LLE 4 to 4+/5. Senses pain in all 4s. May have right field cut--neuro exam stable Psych: remains anxious this am.    Assessment/Plan: 1. Functional deficits secondary to left frontal infarct which require 3+ hours per day of interdisciplinary therapy in a comprehensive inpatient rehab setting.  Physiatrist is providing close team supervision and 24 hour management of active medical problems listed below.  Physiatrist and rehab team continue to assess barriers to discharge/monitor patient progress toward functional and medical goals  Care Tool:  Bathing  Bathing activity did not occur: Safety/medical concerns Body parts bathed by patient: Chest, Abdomen, Face   Body parts bathed by helper: Right arm, Left arm, Front perineal area,  Buttocks, Right upper leg, Left upper leg, Right lower leg, Left lower leg     Bathing assist Assist Level: 2 Helpers     Upper Body Dressing/Undressing Upper body dressing   What is the patient wearing?: Bra, Pull over shirt    Upper body assist Assist Level: Moderate Assistance - Patient 50 - 74%    Lower Body Dressing/Undressing Lower body dressing      What is the patient wearing?: Incontinence brief     Lower body assist Assist for lower body dressing: Total Assistance - Patient < 25%     Toileting Toileting    Toileting assist Assist for toileting: 2 Helpers     Transfers Chair/bed transfer  Transfers assist     Chair/bed transfer assist level: Maximal Assistance - Patient 25 - 49%     Locomotion Ambulation   Ambulation assist   Ambulation activity did not occur: Safety/medical concerns          Walk 10 feet activity   Assist  Walk 10 feet activity did not occur: Safety/medical concerns        Walk 50 feet activity   Assist Walk 50 feet with 2 turns activity did not occur: Safety/medical concerns         Walk 150 feet activity   Assist Walk 150 feet activity did not occur: Safety/medical concerns         Walk 10 feet on uneven surface  activity   Assist Walk 10 feet on uneven surfaces activity did not occur: Safety/medical concerns         Wheelchair     Assist Will patient use  wheelchair at discharge?: Yes Type of Wheelchair: Manual Wheelchair activity did not occur: Safety/medical concerns         Wheelchair 50 feet with 2 turns activity    Assist    Wheelchair 50 feet with 2 turns activity did not occur: Safety/medical concerns       Wheelchair 150 feet activity     Assist Wheelchair 150 feet activity did not occur: Safety/medical concerns        Medical Problem List and Plan: 1.Functional declinesecondary toLeft frontal/ACA infarct.             --Continue CIR therapies including  PT, OT, and SLP , team conf in am 2. Antithrombotics: -DVT/anticoagulation:Pharmaceutical:Lovenox -antiplatelet therapy: Low dose ASA 3. Pain Management:Tylenol prn 4. Mood:LCSW to follow for evaluation and support. -antipsychotic agents: N/A 5. Neuropsych: This patientis not fullycapable of making decisions on onown behalf. 6. Skin/Wound Care:routine pressure relief measures. 7. Fluids/Electrolytes/Nutrition:encourage PO  -D1 thin liquids currently   -sl hypokalemia--supplementing   8. HTN: Monitor BP bid. Continue Amlodipine and Cozaar daily  -controlled at present 3/17 Vitals:   12/10/18 2002 12/11/18 0601  BP: (!) 121/56 (!) 142/58  Pulse: 60 60  Resp: 15 12  Temp: 97.8 F (36.6 C) 98.3 F (36.8 C)  SpO2: 95% 93%   9. Dyslipidemia: On pravastatin.  10. 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   11. Mild hypo K supplement orally  LOS: 4 days A FACE TO FACE EVALUATION WAS PERFORMED  Charlett Blake 12/11/2018, 7:49 AM

## 2018-12-12 ENCOUNTER — Encounter (HOSPITAL_COMMUNITY): Payer: Self-pay

## 2018-12-12 ENCOUNTER — Inpatient Hospital Stay (HOSPITAL_COMMUNITY): Payer: Medicare Other | Admitting: Speech Pathology

## 2018-12-12 ENCOUNTER — Inpatient Hospital Stay (HOSPITAL_COMMUNITY): Payer: Medicare Other

## 2018-12-12 ENCOUNTER — Inpatient Hospital Stay (HOSPITAL_COMMUNITY): Payer: Self-pay

## 2018-12-12 ENCOUNTER — Inpatient Hospital Stay (HOSPITAL_COMMUNITY): Payer: Medicare Other | Admitting: Occupational Therapy

## 2018-12-12 LAB — CBC
HCT: 41.3 % (ref 36.0–46.0)
Hemoglobin: 13.6 g/dL (ref 12.0–15.0)
MCH: 30.6 pg (ref 26.0–34.0)
MCHC: 32.9 g/dL (ref 30.0–36.0)
MCV: 92.8 fL (ref 80.0–100.0)
Platelets: 328 10*3/uL (ref 150–400)
RBC: 4.45 MIL/uL (ref 3.87–5.11)
RDW: 12.3 % (ref 11.5–15.5)
WBC: 7.8 10*3/uL (ref 4.0–10.5)
nRBC: 0 % (ref 0.0–0.2)

## 2018-12-12 LAB — BASIC METABOLIC PANEL
Anion gap: 7 (ref 5–15)
BUN: 22 mg/dL (ref 8–23)
CALCIUM: 8.5 mg/dL — AB (ref 8.9–10.3)
CO2: 28 mmol/L (ref 22–32)
Chloride: 103 mmol/L (ref 98–111)
Creatinine, Ser: 0.78 mg/dL (ref 0.44–1.00)
GFR calc Af Amer: >60 mL/min (ref >60)
Glucose, Bld: 117 mg/dL — ABNORMAL HIGH (ref 70–99)
Potassium: 3.7 mmol/L (ref 3.5–5.1)
Sodium: 138 mmol/L (ref 135–145)

## 2018-12-12 NOTE — Progress Notes (Signed)
Speech Language Pathology Daily Session Note  Patient Details  Name: [name] MRN: [id number] Date of Birth: [birth year]  Today's Date: 12/12/2018 SLP Individual Time: 4235-3614 SLP Individual Time Calculation (min): 45 min  Short Term Goals: Week 1: SLP Short Term Goal 1 (Week 1): Given verbal choice of 2 items, pt will verbalize choice in timely manner with Mod A cues in 5 out of 10 opportunities.  SLP Short Term Goal 2 (Week 1): Given verbal choice of 2 items, pt will produce correct word intelligibility in 5 out of 10 opportunities with Mod A cues.  SLP Short Term Goal 3 (Week 1): Given Max A cues, pt will follow 1 step directions in 5 out of 10 opportunties.  SLP Short Term Goal 4 (Week 1): Given Mod A cues, pt will name common familiar items accuracy in 8 out of 10 opportunities with Min A cues.  SLP Short Term Goal 5 (Week 1): Pt will consume current diet with minimal overt s/s of aspiration and superivsion cues for use of ocmpensatory swallow strategies.  SLP Short Term Goal 6 (Week 1): Pt will cosnume trials of dysphagia 2 with minimal overt s/s of aspiration or dyphagia with Min A cues over 3 sessions to demonstrate readiness for diet upgrade.   Skilled Therapeutic Interventions:  Skilled treatment session focused on dysphagia, communication and cognition goals. SLP facilitated session by providing skilled observation of pt consuming dysphagia 1 with thin liquids. Nursing administered potasium via syringe with overt s/s of aspiration (throat clears). Pt presented with throat clears throughout consumption of breakfast. Pt was difficult to engage in verbal tasks d/t consuming meal. Therefore SLP further facilitated session by providing supervision help with problem solving self-feeding. Pt was great attention tot ask and verbalized that she wanted to self-feed instead of nursing attempting to feed her. Pt left upright in wheelchair, lap belt alarm on and all needs within reach.  Continue per current plan of care.      Pain    Therapy/Group: Individual Therapy  Garlan Drewes 12/12/2018, 10:36 AM

## 2018-12-12 NOTE — Progress Notes (Signed)
[facility] PHYSICAL MEDICINE & REHABILITATION PROGRESS NOTE   Subjective/Complaints:   No issues overnite  ROS: limited due to language/communication    Objective:   No results found. Recent Labs    12/12/18 0438  WBC 7.8  HGB 13.6  HCT 41.3  PLT 328   Recent Labs    12/12/18 0438  NA 138  K 3.7  CL 103  CO2 28  GLUCOSE 117*  BUN 22  CREATININE 0.78  CALCIUM 8.5*    Intake/Output Summary (Last 24 hours) at 12/12/2018 0906 Last data filed at 12/11/2018 1848 Gross per 24 hour  Intake 440 ml  Output -  Net 440 ml     Physical Exam: Vital Signs Blood pressure (!) 138/54, pulse 61, temperature 98 F (36.7 C), temperature source Oral, resp. rate 18, height '5\' 5"'$  (1.651 m), weight 65.2 kg, SpO2 94 %. Constitutional: No distress . Vital signs reviewed. HEENT: EOMI, oral membranes moist Neck: supple Cardiovascular: RRR without murmur. No JVD    Respiratory: CTA Bilaterally without wheezes or rales. Normal effort    GI: BS +, non-tender, non-distended  Neurological: She isalert. Expressive greater than receptive aphasia persists. Followed most simple commands although usually with delay. Expressed verbally in one word, two word answers on occasion. Apraxic. RUE 4/5 and 3- RLE  LUE and LLE 4 to 4+/5. Psych:drowsy but awakens to physical stim from sleep    Assessment/Plan: 1. Functional deficits secondary to left frontal infarct which require 3+ hours per day of interdisciplinary therapy in a comprehensive inpatient rehab setting.  Physiatrist is providing close team supervision and 24 hour management of active medical problems listed below.  Physiatrist and rehab team continue to assess barriers to discharge/monitor patient progress toward functional and medical goals  Care Tool:  Bathing  Bathing activity did not occur: Safety/medical concerns Body parts bathed by patient: Chest, Abdomen, Face   Body parts bathed by helper: Right arm, Left arm, Front  perineal area, Buttocks, Right upper leg, Left upper leg, Right lower leg, Left lower leg     Bathing assist Assist Level: 2 Helpers     Upper Body Dressing/Undressing Upper body dressing   What is the patient wearing?: Bra, Pull over shirt    Upper body assist Assist Level: Moderate Assistance - Patient 50 - 74%    Lower Body Dressing/Undressing Lower body dressing      What is the patient wearing?: Incontinence brief     Lower body assist Assist for lower body dressing: Total Assistance - Patient < 25%     Toileting Toileting    Toileting assist Assist for toileting: 2 Helpers     Transfers Chair/bed transfer  Transfers assist     Chair/bed transfer assist level: 2 Helpers     Locomotion Ambulation   Ambulation assist   Ambulation activity did not occur: Safety/medical concerns          Walk 10 feet activity   Assist  Walk 10 feet activity did not occur: Safety/medical concerns        Walk 50 feet activity   Assist Walk 50 feet with 2 turns activity did not occur: Safety/medical concerns         Walk 150 feet activity   Assist Walk 150 feet activity did not occur: Safety/medical concerns         Walk 10 feet on uneven surface  activity   Assist Walk 10 feet on uneven surfaces activity did not occur: Safety/medical concerns  Wheelchair     Assist Will patient use wheelchair at discharge?: Yes Type of Wheelchair: Manual Wheelchair activity did not occur: Safety/medical concerns         Wheelchair 50 feet with 2 turns activity    Assist    Wheelchair 50 feet with 2 turns activity did not occur: Safety/medical concerns       Wheelchair 150 feet activity     Assist Wheelchair 150 feet activity did not occur: Safety/medical concerns        Medical Problem List and Plan: 1.Functional declinesecondary toLeft frontal/ACA infarct.             --Continue CIR therapies including PT, OT, and  SLP , Team conference today please see physician documentation under team conference tab, met with team face-to-face to discuss problems,progress, and goals. Formulized individual treatment plan based on medical history, underlying problem and comorbidities.2. Antithrombotics: -DVT/anticoagulation:Pharmaceutical:Lovenox -antiplatelet therapy: Low dose ASA 3. Pain Management:Tylenol prn 4. Mood:LCSW to follow for evaluation and support. -antipsychotic agents: N/A 5. Neuropsych: This patientis not fullycapable of making decisions on onown behalf. 6. Skin/Wound Care:routine pressure relief measures. 7. Fluids/Electrolytes/Nutrition:encourage PO  -D1 thin liquids currently   -sl hypokalemia--supplementing KCL 59mq daily   8. HTN: Monitor BP bid. Continue Amlodipine and Cozaar daily  -controlled at present 3/18 Vitals:   12/12/18 0407 12/12/18 0904  BP: (!) 147/54 (!) 138/54  Pulse: (!) 59 61  Resp: 18   Temp: 98 F (36.7 C)   SpO2: 94%    9. Dyslipidemia: On pravastatin.  10. 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   11. Mild hypo K supplement orally  LOS: 5 days A FACE TO FACE EVALUATION WAS PERFORMED  ACharlett Blake3/18/2020, 9:06 AM

## 2018-12-12 NOTE — Progress Notes (Signed)
Physical Therapy Session Note  Patient Details  Name: [name] MRN: [id number] Date of Birth: March 27, 1922  Today's Date: 12/12/2018 PT Individual Time: 1030-1100 and 1402-1500 PT Individual Time Calculation (min): 30 min and 58 min  Short Term Goals: Week 1:  PT Short Term Goal 1 (Week 1): Pt will transfer bed<>chair w/ mod assist +1 PT Short Term Goal 2 (Week 1): Pt will initiate gait training PT Short Term Goal 3 (Week 1): Pt will initiate w/c mobility training PT Short Term Goal 4 (Week 1): Pt will achieve midline orientation w/ cues 50% of the time   Skilled Therapeutic Interventions/Progress Updates:    Session 1: Pt seated in w/c upon PT arrival, agreeable to therapy tx and denies pain. Pt transported to the dayroom. Session focused on standing with use of the standing frame for dependent sit<>stand. While standing in standing frame pt worked on midline orientation with use of mirror, lateral weightshifting with facilitation, performing reaching task while maintaining midline and upright posture, manual facilitation for hip extension and trunk extension. Pt returned to room at end of session and left in w/c with needs in reach and chair alarm set.   Session 2: Pt supine in bed upon PT arrival, agreeable to therapy tx and no evidence of pain at rest. Pt supine donned pants with total assist to loop LEs through, with LEs placed in flexion for hooklying pt is able to initiate bridging to pull pants over hips. Pt rolled to R with mod assist to finish pulling pants over hips, rolled to L with max assist. Sidelying to sitting with max assist. Pt seated EOB able to initiate scooting motion but unable to lift hips, requires max +2 for squat pivot to w/c. Pt transported to gym. Max +2 squat pivot transfer to the mat to L. Pt seated edge of mat able to scoot laterally with mod assist and max visual cueing. Pt performed x 3 sit<>stands throughout this session with max+2 assist and max cues. In  standing pt requires tactile cues and manual facilitation for increased hip extension and trunk extension, mirror used for visual feedback. Pt able to stand with elbow support on high table this session max assist+2, standing at table performed L UE reaching task. Pt ambulated x 11 ft with UE elbow support on rolling table and max assist +2, 3rd person for w/c follow for safety. Pt stood with eva walker and ambulated x 3 ft max assist +2. Pt seated in w/c worked on anterior leaning while performing reaching task. Pt transported back to room and left seated in w/c with needs in reach and chair alarm set.    Therapy Documentation Precautions:  Precautions Precautions: Fall Restrictions Weight Bearing Restrictions: No    Therapy/Group: Individual Therapy  Netta Corrigan, PT, DPT 12/12/2018, 7:51 AM

## 2018-12-12 NOTE — Progress Notes (Signed)
Occupational Therapy Session Note  Patient Details  Name: [name] MRN: [id number] Date of Birth: 09/05/1922  Today's Date: 12/12/2018 OT Individual Time: 0800-0900 OT Individual Time Calculation (min): 60 min    Short Term Goals: Week 1:  OT Short Term Goal 1 (Week 1): Pt will tolerate OOB activity for 30 mins with 2 rest breaks to demonstrate increased activity tolerance OT Short Term Goal 2 (Week 1): Pt will complete bathing with max assist of 1 caregiver OT Short Term Goal 3 (Week 1): Pt will completed UB dressing with mod assist OT Short Term Goal 4 (Week 1): Pt will complete LB dressing with max assist of 1 caregiver  Skilled Therapeutic Interventions/Progress Updates:    Pt completed supine to sit EOB with max assist.  Once sitting, she was able to maintain static sitting balance with close supervision, demonstrating increased lean to the right.  Next had pt complete squat pivot transfer to the right with max assist.  She then worked on bathing and dressing sit to stand at the sink.  Min assist with mod instructional cueing for UB bathing.  Max assist for UB dressing to donn bra and pullover top.  Max assist for LB bathing sit to stand with max assist for dressing as well.  Pt with decreased forward trunk flexion for sit to stand with increased pushing to the right noted.  In sitting she demonstrates increased weightshift over to the right hip with decreased ability to correct as well as resistance to therapist attempting to help correct it.  Finished session with min assist for brushing her hair.  She was left sitting up in the wheelchair at bedside with call button and phone in reach with safety alarm belt in place.    Therapy Documentation Precautions:  Precautions Precautions: Fall Restrictions Weight Bearing Restrictions: No  Pain: Pain Assessment Pain Score: 0-No pain ADL: See Care Tool Section for some details of ADL  Therapy/Group: Individual  Therapy  Reverie Vaquera OTR/L 12/12/2018, 11:06 AM

## 2018-12-13 ENCOUNTER — Inpatient Hospital Stay (HOSPITAL_COMMUNITY): Payer: Self-pay | Admitting: Physical Therapy

## 2018-12-13 ENCOUNTER — Ambulatory Visit (HOSPITAL_COMMUNITY): Payer: Medicare Other | Admitting: Speech Pathology

## 2018-12-13 ENCOUNTER — Inpatient Hospital Stay (HOSPITAL_COMMUNITY): Payer: Medicare Other | Admitting: Occupational Therapy

## 2018-12-13 ENCOUNTER — Inpatient Hospital Stay (HOSPITAL_COMMUNITY): Payer: Medicare Other

## 2018-12-13 NOTE — Progress Notes (Signed)
Physical Therapy Session Note  Patient Details  Name: [name] MRN: [id number] Date of Birth: Sep 21, 1922  Today's Date: 12/13/2018 PT Individual Time: 0900-1000 PT Individual Time Calculation (min): 60 min   Short Term Goals: Week 1:  PT Short Term Goal 1 (Week 1): Pt will transfer bed<>chair w/ mod assist +1 PT Short Term Goal 2 (Week 1): Pt will initiate gait training PT Short Term Goal 3 (Week 1): Pt will initiate w/c mobility training PT Short Term Goal 4 (Week 1): Pt will achieve midline orientation w/ cues 50% of the time   Skilled Therapeutic Interventions/Progress Updates:    Patient in supine and agreeable to PT.  Assisted to don pants with total A for time looping legs into pant legs and rolling to pull up with min to max A and cues for using railing.  Side to sit max A.  Transfer to w/c via slide board and +2 A.  Assisted in w/c to gym.  Transferred to mat initially attempted with slide board and +2, but difficulty, so performed squat pivot with max A.  Patient on mat for sitting balance working on keeping L shoulder on PT shoulder while tossing and catching ball with max cues and S to CGA for balance.  Assisted with trunk movements and weight shifts sitting for ROM/mobility/and to decrease tension.  Patient sit to stand in [place] x 3 with +2 A.  Able to stand several seconds each time with mod A and cues for posture.  Patient transferred to w/c with max A.  Assisted to wall rail in hallway.  Stood with max A and gait x 20', then 10' along railing on L and HHA on R facilitation for weight shifts, posture, forward gaze and stride length.  Patient assisted in w/c to room and left up in chair with alarm belt and soft touch call bell in reach.  NT informed to assist pt to bed in 1 hour.   Therapy Documentation Precautions:  Precautions Precautions: Fall Restrictions Weight Bearing Restrictions: No Pain: Pain Assessment Pain Scale: 0-10 Pain Score: 0-No pain Faces Pain  Scale: Hurts little more Pain Type: Acute pain Pain Location: Generalized Pain Descriptors / Indicators: Grimacing;Discomfort;Moaning Pain Intervention(s): Repositioned;Ambulation/increased activity;Cutaneous stimulation    Therapy/Group: Individual Therapy  Reginia Naas  [place], PT 12/13/2018, 1:28 PM

## 2018-12-13 NOTE — Progress Notes (Signed)
[facility] PHYSICAL MEDICINE & REHABILITATION PROGRESS NOTE   Subjective/Complaints:   Discussed baseline with SLP who has spoken to family.  Pt had severe aphasia PTA and had difficulty naming simple items such as shampoo, was able to order beef and cheicken with dietary today , and [facility]  ROS: limited due to language/communication    Objective:   No results found. Recent Labs    12/12/18 0438  WBC 7.8  HGB 13.6  HCT 41.3  PLT 328   Recent Labs    12/12/18 0438  NA 138  K 3.7  CL 103  CO2 28  GLUCOSE 117*  BUN 22  CREATININE 0.78  CALCIUM 8.5*    Intake/Output Summary (Last 24 hours) at 12/13/2018 0854 Last data filed at 12/12/2018 1800 Gross per 24 hour  Intake 260 ml  Output -  Net 260 ml     Physical Exam: Vital Signs Blood pressure (!) 145/55, pulse 65, temperature 97.8 F (36.6 C), temperature source Oral, resp. rate 16, height 5\' 5"  (1.651 m), weight 65.2 kg, SpO2 95 %. Constitutional: No distress . Vital signs reviewed. HEENT: EOMI, oral membranes moist Neck: supple Cardiovascular: RRR without murmur. No JVD    Respiratory: CTA Bilaterally without wheezes or rales. Normal effort    GI: BS +, non-tender, non-distended  Neurological: She isalert. Expressive greater than receptive aphasia persists. Followed most simple commands although usually with delay. Persverates on "happy, happy, happy". Apraxic. RUE 4/5 and 3- RLE  LUE and LLE 4 to 4+/5. Psych:drowsy but awakens to physical stim from sleep    Assessment/Plan: 1. Functional deficits secondary to left frontal infarct which require 3+ hours per day of interdisciplinary therapy in a comprehensive inpatient rehab setting.  Physiatrist is providing close team supervision and 24 hour management of active medical problems listed below.  Physiatrist and rehab team continue to assess barriers to discharge/monitor patient progress toward functional and medical  goals  Care Tool:  Bathing  Bathing activity did not occur: Safety/medical concerns Body parts bathed by patient: Chest, Abdomen, Face   Body parts bathed by helper: Right arm, Left arm, Front perineal area, Buttocks, Right upper leg, Left upper leg, Right lower leg, Left lower leg     Bathing assist Assist Level: 2 Helpers     Upper Body Dressing/Undressing Upper body dressing   What is the patient wearing?: Bra, Pull over shirt    Upper body assist Assist Level: Moderate Assistance - Patient 50 - 74%    Lower Body Dressing/Undressing Lower body dressing      What is the patient wearing?: Incontinence brief     Lower body assist Assist for lower body dressing: Total Assistance - Patient < 25%     Toileting Toileting    Toileting assist Assist for toileting: 2 Helpers     Transfers Chair/bed transfer  Transfers assist     Chair/bed transfer assist level: 2 Helpers     Locomotion Ambulation   Ambulation assist   Ambulation activity did not occur: Safety/medical concerns  Assist level: 2 helpers   Max distance: 11 ft   Walk 10 feet activity   Assist  Walk 10 feet activity did not occur: Safety/medical concerns  Assist level: 2 helpers Assistive device: Other (comment)(rolling table for elbow support)   Walk 50 feet activity   Assist Walk 50 feet with 2 turns activity did not occur: Safety/medical concerns         Walk 150  feet activity   Assist Walk 150 feet activity did not occur: Safety/medical concerns         Walk 10 feet on uneven surface  activity   Assist Walk 10 feet on uneven surfaces activity did not occur: Safety/medical concerns         Wheelchair     Assist Will patient use wheelchair at discharge?: Yes Type of Wheelchair: Manual Wheelchair activity did not occur: Safety/medical concerns         Wheelchair 50 feet with 2 turns activity    Assist    Wheelchair 50 feet with 2 turns activity did  not occur: Safety/medical concerns       Wheelchair 150 feet activity     Assist Wheelchair 150 feet activity did not occur: Safety/medical concerns        Medical Problem List and Plan: 1.Functional declinesecondary toLeft frontal/ACA infarct.             --Continue CIR therapies including PT, OT, and SLP , .2. Antithrombotics: -DVT/anticoagulation:Pharmaceutical:Lovenox -antiplatelet therapy: Low dose ASA 3. Pain Management:Tylenol prn 4. Mood:LCSW to follow for evaluation and support. -antipsychotic agents: N/A 5. Neuropsych: This patientis not fullycapable of making decisions on onown behalf. 6. Skin/Wound Care:routine pressure relief measures. 7. Fluids/Electrolytes/Nutrition:encourage PO  -D1 thin liquids currently   -sl hypokalemia--supplementing KCL 42meq daily   8. HTN: Monitor BP bid. Continue Amlodipine and Cozaar daily  -mild elevation this am no change to meds at this time Vitals:   12/12/18 1926 12/13/18 0439  BP: (!) 137/59 (!) 145/55  Pulse: 74 65  Resp: 14 16  Temp: (!) 97.4 F (36.3 C) 97.8 F (36.6 C)  SpO2: 95% 95%   9. Dyslipidemia: On pravastatin.  10. 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   11. Mild hypo K supplement orally 12.  Dysphagia post stroke on pureed diet LOS: 6 days A FACE TO FACE EVALUATION WAS PERFORMED  Charlett Blake 12/13/2018, 8:54 AM

## 2018-12-13 NOTE — Progress Notes (Signed)
Physical Therapy Session Note  Patient Details  Name: [name] MRN: [id number] Date of Birth: May 12, 1922  Today's Date: 12/13/2018 PT Individual Time: 8381-8403 PT Individual Time Calculation (min): 30 min   Short Term Goals: Week 1:  PT Short Term Goal 1 (Week 1): Pt will transfer bed<>chair w/ mod assist +1 PT Short Term Goal 2 (Week 1): Pt will initiate gait training PT Short Term Goal 3 (Week 1): Pt will initiate w/c mobility training PT Short Term Goal 4 (Week 1): Pt will achieve midline orientation w/ cues 50% of the time   Skilled Therapeutic Interventions/Progress Updates:    Pt received supine in bed and agreeable to therapy session. Performed supine to sit with min A using bed features with cuing for sequencing and sustained attention to task. Performed seated balance tasks of reaching with forward anterior weight shifting to improve sit<>stand transfers. Attempted sit to stand transfer with 1 person assist with increased time for pt initiation of task using B LE dressing task to encourage standing; however, unable to achieve lifting bottom from bed with max A of 1 due to pt resisting forward anterior weight shift and resisting assistance to stand. Performed seated lateral scooting on EOB with increased time for pt initiation and cuing for hand placement, anterior weight shift and max A for scooting. Pt reported needing to urinate with therapist assisting pt to supine with max A for trunk descent and B LE management. Nursing staff present and pt performed rolling R and L with max A and cuing for hand placement on bedrails to increase assist with rolling while placing maxi move sling. Pt left sitting on toilet in maxi-move with nursing staff to continue care with patient.   Therapy Documentation Precautions:  Precautions Precautions: Fall Restrictions Weight Bearing Restrictions: No Pain: Pain Assessment Pain Scale: Faces Faces Pain Scale: Hurts a little bit Pain Type:  Acute pain Pain Location: Hand Pain Orientation: Right Pain Descriptors / Indicators: Discomfort Pain Onset: With Activity Pain Intervention(s): Repositioned   Therapy/Group: Individual Therapy  Tawana Scale, PT, DPT 12/13/2018, 3:42 PM

## 2018-12-13 NOTE — Progress Notes (Addendum)
Speech Language Pathology Daily Session Note  Patient Details  Name: [name] MRN: [id number] Date of Birth: March 29, 1922  Today's Date: 12/13/2018 SLP Individual Time: 0815-0900 SLP Individual Time Calculation (min): 45 min  Short Term Goals: Week 1: SLP Short Term Goal 1 (Week 1): Given verbal choice of 2 items, pt will verbalize choice in timely manner with Mod A cues in 5 out of 10 opportunities.  SLP Short Term Goal 2 (Week 1): Given verbal choice of 2 items, pt will produce correct word intelligibility in 5 out of 10 opportunities with Mod A cues.  SLP Short Term Goal 3 (Week 1): Given Max A cues, pt will follow 1 step directions in 5 out of 10 opportunties.  SLP Short Term Goal 4 (Week 1): Given Mod A cues, pt will name common familiar items accuracy in 8 out of 10 opportunities with Min A cues.  SLP Short Term Goal 5 (Week 1): Pt will consume current diet with minimal overt s/s of aspiration and superivsion cues for use of ocmpensatory swallow strategies.  SLP Short Term Goal 6 (Week 1): Pt will cosnume trials of dysphagia 2 with minimal overt s/s of aspiration or dyphagia with Min A cues over 3 sessions to demonstrate readiness for diet upgrade.   Skilled Therapeutic Interventions:  Skilled treatment session focused on communication and dysphagia goals. SLP facilitated session by providing 1 step contextually based cues for pt to move up in bed. Pt able to perform. Additionally, pt able to communicate in simple words to simple phrases that she wanted 2 creamers in coffee. All responses throughout session were within timely manner. Additionally, pt able to express desired items for meals with the food ambassador presenting 2 verbal choices each time. When pt stated incorrect items, pt aware and shook her "no" and then able to state correct choice. Communication within this activity appears to be nearing baseline report for activity at [employer]. Pt able to follow simple 1 step  directions with MD (with contextual cues). She self-fed herself entire meal. However throughout consumption pt presented with throat clears when consuming puree and exhibits s/s of decreased task endurance when coupled with physical demand of self-feeding and coordination of mastication and swallowing. Pt with great selective attention to task. Pt didn't display any perseverative motor patterns and her baseline rote phrase ever present (she had stoped producing them during initial admission). Pt left upright in bed, bed alarm on and all needs within reach. Continue per current plan of care.       Pain Pain Assessment Pain Scale: 0-10 Pain Score: 0-No pain  Therapy/Group: Individual Therapy  [name] 12/13/2018, 12:16 PM

## 2018-12-13 NOTE — Progress Notes (Signed)
Occupational Therapy Session Note  Patient Details  Name: [name] MRN: [id number] Date of Birth: 12-27-1921  Today's Date: 12/13/2018 OT Individual Time: 4270-6237 OT Individual Time Calculation (min): 60 min    Short Term Goals: Week 1:  OT Short Term Goal 1 (Week 1): Pt will tolerate OOB activity for 30 mins with 2 rest breaks to demonstrate increased activity tolerance OT Short Term Goal 2 (Week 1): Pt will complete bathing with max assist of 1 caregiver OT Short Term Goal 3 (Week 1): Pt will completed UB dressing with mod assist OT Short Term Goal 4 (Week 1): Pt will complete LB dressing with max assist of 1 caregiver  Skilled Therapeutic Interventions/Progress Updates:    Pt completed stand pivot transfers during session with max assist to the therapy mat on the right as well as total assist +2 (pt 30%) to the 3:1 over the toilet.  She needed total assist +2 (pt 30%) for toilet hygiene and clothing management sit to stand after bladder incontinence in her brief.  Max demonstrational cueing with increased time for initiation of movement.  She is able to complete sit to stand with and without the RW with mod assist but only if guided and therapist waits until she initiates movement.  Still with slight pusher tendencies to the right as well as decreased forward trunk flexion with sit to stand.  Finished session with neuromuscular re-education in standing for the trunk and RLE.  She was able to complete functional mobility x2 with and without use of the RW.  She demonstrated decreased motor planning for initiation of stepping forward with the RLE both with and without the RW.  Max assist for initial stepping without the RW with progression to mod assist to advance the RLE only.  She also demonstrates decreased ability to achieve and maintain hip and trunk extension in standing, requiring max demonstrational cueing to maintain.  Finished session with pt in the wheelchair with call button  and phone in reach.       Therapy Documentation Precautions:  Precautions Precautions: Fall Restrictions Weight Bearing Restrictions: No  Pain: Pain Assessment Pain Scale: Faces Pain Score: 0-No pain Faces Pain Scale: Hurts a little bit Pain Type: Acute pain Pain Location: Hand Pain Orientation: Right Pain Descriptors / Indicators: Discomfort Pain Onset: With Activity Pain Intervention(s): Repositioned ADL: See Care Tool Section for some details of ADL   Therapy/Group: Individual Therapy  Orville Widmann OTR/L 12/13/2018, 3:51 PM

## 2018-12-14 ENCOUNTER — Inpatient Hospital Stay (HOSPITAL_COMMUNITY): Payer: Medicare Other | Admitting: Physical Therapy

## 2018-12-14 ENCOUNTER — Inpatient Hospital Stay (HOSPITAL_COMMUNITY): Payer: Medicare Other | Admitting: Occupational Therapy

## 2018-12-14 ENCOUNTER — Inpatient Hospital Stay (HOSPITAL_COMMUNITY): Payer: Medicare Other | Admitting: Speech Pathology

## 2018-12-14 NOTE — Progress Notes (Signed)
Physical Therapy Session Note  Patient Details  Name: [name] MRN: [id number] Date of Birth: [birth year]  Today's Date: 12/14/2018 PT Individual Time: 8657-8469 PT Individual Time Calculation (min): 62 min   Short Term Goals: Week 1:  PT Short Term Goal 1 (Week 1): Pt will transfer bed<>chair w/ mod assist +1 PT Short Term Goal 2 (Week 1): Pt will initiate gait training PT Short Term Goal 3 (Week 1): Pt will initiate w/c mobility training PT Short Term Goal 4 (Week 1): Pt will achieve midline orientation w/ cues 50% of the time   Skilled Therapeutic Interventions/Progress Updates:    Pt received supine in bed and agreeable to therapy session. Pt with increased vocalization this session for improved responses to questions. Performed squat pivot tranfser bed to w/c with max A due to poor initiation and sequencing of task. Performed sit<>stand from w/c to raised mat table for B UE support with mod A for lifting and pt continuing to require increased time for initiation of tasks. Performed sit<>stand from w/c to RW with mod A for lifting and controlled lowering. Progressed to ambulating 57ft and 100ft using RW, seated break between, with mod A for balance - pt demonstrated step-to pattern of R LE progressed to inconsistent reciprocal stepping pattern - manual facilitation for L weight shifting to improve R LE step length, upright posture, and step sequencing. Performed alternate B LE step forward/back to external target on floor with B UE support on RW - cuing for sequencing and manual facilitation for weight shift with focus on increased step length. Pt transported in w/c to day room for energy conservation. Pt performed stand pivot transfer to/from wheelchair using RW with mod A for lifting/lowering and min/mod A for standing balance during pivot. Performed Nustep focusing on increased activity tolerance and alternating reciprocal movement of B LEs at level 5 resistance for 5 minutes for a  total of 211steps. Pt transported to room in w/c and left sitting in w/c with needs in reach and seat belt alarm on.  Therapy Documentation Precautions:  Precautions Precautions: Fall Restrictions Weight Bearing Restrictions: No  Pain: Vocalizes "oh" when going from stand to sit throughout session possibly indicating increased sacral discomfort - RN notified and increased assist provided for controlled lowering.  Therapy/Group: Individual Therapy  Tawana Scale, PT, DPT 12/14/2018, 3:05 PM

## 2018-12-14 NOTE — Patient Care Conference (Signed)
Inpatient RehabilitationTeam Conference and Plan of Care Update Date: 12/12/2018   Time: 11:00 AM    Patient Name: [name]      Medical Record Number: [id number]  Date of Birth: Mar 28, 1922 Sex: Female         Room/Bed: 4W11C/4W11C-01 Payor Info: Payor: MEDICARE / Plan: MEDICARE PART A AND B / Product Type: *No Product type* /    Admitting Diagnosis: cva  Admit Date/Time:  12/07/2018  5:48 PM Admission Comments: No comment available   Primary Diagnosis:  <principal problem not specified> Principal Problem: <principal problem not specified>  Patient Active Problem List   Diagnosis Date Noted  . Acute right ACA stroke ([place]) 12/07/2018  . Stroke (cerebrum) ([place]) 12/03/2018  . Cough 10/11/2018  . Abnormal breath sounds 10/11/2018  . Closed nondisplaced fracture of proximal phalanx of lesser toe of right foot 06/26/2018  . Right ankle pain 06/21/2018  . Right foot pain 06/21/2018  . Abnormal CXR 04/26/2017  . Lobar pneumonia ([place]) 01/20/2017  . Acute on chronic respiratory failure with hypoxia ([place]) 01/17/2017  . Acute respiratory failure with hypoxia ([place]) 01/17/2017  . DOE (dyspnea on exertion) 10/23/2014  . Hyponatremia 10/23/2014  . RLL pneumonia ([place]) 10/11/2013  . Other specified anemias 10/10/2013  . Acute respiratory failure ([place]) 08/15/2013  . Aspiration pneumonia ([place]) 08/15/2013  . Altered mental status 08/15/2013  . Incarcerated femoral hernia 08/14/2013  . Esophageal reflux 08/05/2013  . Unspecified constipation 07/03/2013  . Closed fracture of left hip ([place]) 07/01/2013  . 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 Atrial fibrillation ([place]) 10/30/2012  . Low back pain 10/26/2012  . Syncope 10/26/2012  . PNA (pneumonia) 09/26/2012  . UTI (lower urinary tract infection) 09/26/2012  . Hypokalemia 09/26/2012  . Leukocytosis 09/26/2012  . Cardiac enzymes elevated 07/25/2011  . Impaired glucose tolerance 06/03/2011  . Preventative health  care 06/03/2011  . IRON DEFICIENCY 12/06/2010  . 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 CVA (cerebral vascular accident) ([place]) 01/09/2009  . Aphasia 01/09/2009  . Urinary incontinence 01/09/2009  . PACEMAKER, PERMANENT 01/09/2009  . Dementia ([place]) 05/19/2008  . Hypothyroidism 11/20/2007  . Hyperlipidemia 11/20/2007  . Essential hypertension 11/20/2007  . DIVERTICULOSIS, COLON 11/20/2007  . FATIGUE 11/20/2007  . CEREBROVASCULAR ACCIDENT, HX OF 11/20/2007  . TIA 05/14/2003    Expected Discharge Date: Expected Discharge Date: (SNF)  Team Members Present: Physician leading conference: Dr. Alysia Penna Social Worker Present: Alfonse Alpers, LCSW Nurse Present: Rayetta Humphrey, RN PT Present: Drema Dallas Shagen, PT;Other (comment)(Carly Pippin, PT) OT Present: Clyda Greener, OT SLP Present: Weston Anna, SLP PPS Coordinator present : Gunnar Fusi     Current Status/Progress Goal Weekly Team Focus  Medical   A phasic with baseline confusion.  Blood pressures variable but overall controlled, mild bradycardia asymptomatic  Maintain medical stability, reduce fall risk  Initiate therapy program   Bowel/Bladder   incontinent of bowel & bladder, LBM 12/11/18  less episodes of incontinence  timed toileting when awake   Swallow/Nutrition/ Hydration   dysphagia 1 with thin liquids  Min A with least restrictive diet  trials of dysphagia 2   ADL's   Min to mod assist for UB selfcare with total +2 for all LB selfcare sit to stand and for stand pivot transfers.  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   min assist overalls  selfcare retraining, balance retraining, transfer training, neuromuscular re-education, pt/family education   Mobility   mod assist bed mobility, max assist +2 for transfers  and sit<>stand  supervision-min assist  transfers, gait, standing tolerance, balance, midline orientation, R neuro re-ed   Communication   Total A for simple words, basic sorting tasks, perseverative motor patterns,  increased processing time  Max A for items known to pt, Max A following 1 step directions - given signficant baseline impairments  comprehension and expression of basic simple ideas   Safety/Cognition/ Behavioral Observations            Pain   no c/o pain, has tylenol prn  pain scale <2/10  assess & treat as needed   Skin   some MASD to buttocks & perineum, skin barrier used  no skin break down while on rehab  assess q shift    Rehab Goals Patient on target to meet rehab goals: Yes Rehab Goals Revised: none *See Care Plan and progress notes for long and short-term goals.     Barriers to Discharge  Current Status/Progress Possible Resolutions Date Resolved   Physician    Medical stability;Decreased caregiver support     Patient has difficulty following commands has both receptive and expressive language deficits  Continue rehab, speech-language pathology, determine optimal discharge disposition      Nursing  Incontinence               PT  Other (comments)  baseline cognitive deficits/aphasia, age              OT                  SLP (PLOF) PLOF            SW                Discharge Planning/Teaching Needs:  Pt's therapy team feels pt may need SNF care prior to returning to [place] [facility].  If pt's family decides to take pt home, then family education with pt's family and/or caregiver will need to occur.   Team Discussion:  MD stated that pt has had old strokes, so it's hard to tell what new deficits are.  Pt also has some per-exisiting dementia.  She is aphasic, but son says this is pre-existing.  Pt is incontinent of bowel and bladder, but can be continent when taken to the restroom.  Nursing to try timed toileting.  Pt is min A with cues for UB tasks and max to total A for LB tasks, pushes to the right.  Pt is total A +2 for toileting due to pushing.  Pt is mod to max A for bed mobility, +2 txs for sit to stand and PT had not walked her yet.  [name] is hard to  use for pt, so PT recommends nursing use the maxisky.  Pt's sitting balance is good and she can self correct any leaning when looking in the mirror.  ST is working on basic naming and basic cognition.  On D1, thin liquids, but will try solids today as pt is looking good.  Revisions to Treatment Plan:  none    Continued Need for Acute Rehabilitation Level of Care: The patient requires daily medical management by a physician with specialized training in physical medicine and rehabilitation for the following conditions: Daily direction of a multidisciplinary physical rehabilitation program to ensure safe treatment while eliciting the highest outcome that is of practical value to the patient.: Yes Daily medical management of patient stability for increased activity during participation in an intensive rehabilitation regime.: Yes Daily analysis of laboratory values and/or  radiology reports with any subsequent need for medication adjustment of medical intervention for : Neurological problems;Other;Mood/behavior problems   I attest that I was present, lead the team conference, and concur with the assessment and plan of the team.   Paxton Kanaan, Silvestre Mesi 12/14/2018, 11:07 PM

## 2018-12-14 NOTE — Progress Notes (Signed)
[place] PHYSICAL MEDICINE & REHABILITATION PROGRESS NOTE   Subjective/Complaints:    No issues overnite  ROS: limited due to language/communication    Objective:   No results found. Recent Labs    12/12/18 0438  WBC 7.8  HGB 13.6  HCT 41.3  PLT 328   Recent Labs    12/12/18 0438  NA 138  K 3.7  CL 103  CO2 28  GLUCOSE 117*  BUN 22  CREATININE 0.78  CALCIUM 8.5*    Intake/Output Summary (Last 24 hours) at 12/14/2018 0816 Last data filed at 12/13/2018 1823 Gross per 24 hour  Intake 360 ml  Output -  Net 360 ml     Physical Exam: Vital Signs Blood pressure (!) 139/58, pulse 60, temperature (!) 97.5 F (36.4 C), temperature source Oral, resp. rate 16, height 5\' 5"  (1.651 m), weight 65.2 kg, SpO2 99 %. Constitutional: No distress . Vital signs reviewed. HEENT: EOMI, oral membranes moist Neck: supple Cardiovascular: RRR without murmur. No JVD    Respiratory: CTA Bilaterally without wheezes or rales. Normal effort    GI: BS +, non-tender, non-distended  Neurological: She isalert. Expressive greater than receptive aphasia persists. Followed most simple commands although usually with delay. Persverates on "happy, happy, happy". Apraxic. RUE 4/5 and 3- RLE  LUE and LLE 4 to 4+/5. Psych:drowsy but awakens to physical stim from sleep    Assessment/Plan: 1. Functional deficits secondary to left frontal infarct which require 3+ hours per day of interdisciplinary therapy in a comprehensive inpatient rehab setting.  Physiatrist is providing close team supervision and 24 hour management of active medical problems listed below.  Physiatrist and rehab team continue to assess barriers to discharge/monitor patient progress toward functional and medical goals  Care Tool:  Bathing  Bathing activity did not occur: Safety/medical concerns Body parts bathed by patient: Chest, Abdomen, Face   Body parts bathed by helper: Right arm, Left arm, Front perineal area,  Buttocks, Right upper leg, Left upper leg, Right lower leg, Left lower leg     Bathing assist Assist Level: 2 Helpers     Upper Body Dressing/Undressing Upper body dressing   What is the patient wearing?: Bra, Pull over shirt    Upper body assist Assist Level: Moderate Assistance - Patient 50 - 74%    Lower Body Dressing/Undressing Lower body dressing      What is the patient wearing?: Incontinence brief     Lower body assist Assist for lower body dressing: Total Assistance - Patient < 25%     Toileting Toileting    Toileting assist Assist for toileting: 2 Helpers     Transfers Chair/bed transfer  Transfers assist     Chair/bed transfer assist level: 2 Helpers     Locomotion Ambulation   Ambulation assist   Ambulation activity did not occur: Safety/medical concerns  Assist level: 2 helpers Assistive device: Walker-rolling Max distance: 17'   Walk 10 feet activity   Assist  Walk 10 feet activity did not occur: Safety/medical concerns  Assist level: 2 helpers(wheelchair follow) Assistive device: Hand held assist, Other (comment)(wall rail)   Walk 50 feet activity   Assist Walk 50 feet with 2 turns activity did not occur: Safety/medical concerns         Walk 150 feet activity   Assist Walk 150 feet activity did not occur: Safety/medical concerns         Walk 10 feet on uneven surface  activity   Assist Walk 10 feet on  uneven surfaces activity did not occur: Safety/medical concerns         Wheelchair     Assist Will patient use wheelchair at discharge?: Yes Type of Wheelchair: Manual Wheelchair activity did not occur: Safety/medical concerns         Wheelchair 50 feet with 2 turns activity    Assist    Wheelchair 50 feet with 2 turns activity did not occur: Safety/medical concerns       Wheelchair 150 feet activity     Assist Wheelchair 150 feet activity did not occur: Safety/medical concerns         Medical Problem List and Plan: 1.Functional declinesecondary toLeft frontal/ACA infarct.             --Continue CIR therapies including PT, OT, and SLP , .2. Antithrombotics: -DVT/anticoagulation:Pharmaceutical:Lovenox -antiplatelet therapy: Low dose ASA 3. Pain Management:Tylenol prn 4. Mood:LCSW to follow for evaluation and support. -antipsychotic agents: N/A 5. Neuropsych: This patientis not fullycapable of making decisions on onown behalf. 6. Skin/Wound Care:routine pressure relief measures. 7. Fluids/Electrolytes/Nutrition:encourage PO  -D1 thin liquids currently   -sl hypokalemia--supplementing KCL 74meq daily   8. HTN: Monitor BP bid. Continue Amlodipine and Cozaar daily  -mild elevation this am no change to meds at this time Vitals:   12/13/18 1926 12/14/18 0457  BP: (!) 122/53 (!) 139/58  Pulse: 62 60  Resp: 16 16  Temp: 98.6 F (37 C) (!) 97.5 F (36.4 C)  SpO2: 96% 99%   9. Dyslipidemia: On pravastatin.  10. 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   11. Mild hypo K supplement orally 12.  Dysphagia post stroke on pureed diet- intake is good LOS: 7 days A FACE TO FACE EVALUATION WAS PERFORMED  Charlett Blake 12/14/2018, 8:16 AM

## 2018-12-14 NOTE — Progress Notes (Signed)
Speech Language Pathology Weekly Progress and Session Note  Patient Details  Name: [name] MRN: [id number] Date of Birth: Aug 10, 1922  Beginning of progress report period: December 08, 2018 End of progress report period: December 14, 2018  Today's Date: 12/14/2018 SLP Individual Time: 0915-1000 SLP Individual Time Calculation (min): 45 min  Short Term Goals: Week 1: SLP Short Term Goal 1 (Week 1): Given verbal choice of 2 items, pt will verbalize choice in timely manner with Mod A cues in 5 out of 10 opportunities.  SLP Short Term Goal 1 - Progress (Week 1): Met SLP Short Term Goal 2 (Week 1): Given verbal choice of 2 items, pt will produce correct word intelligibility in 5 out of 10 opportunities with Mod A cues.  SLP Short Term Goal 2 - Progress (Week 1): Met SLP Short Term Goal 3 (Week 1): Given Max A cues, pt will follow 1 step directions in 5 out of 10 opportunties.  SLP Short Term Goal 3 - Progress (Week 1): Met SLP Short Term Goal 4 (Week 1): Given Mod A cues, pt will name common familiar items accuracy in 8 out of 10 opportunities. SLP Short Term Goal 4 - Progress (Week 1): Met SLP Short Term Goal 5 (Week 1): Pt will consume current diet with minimal overt s/s of aspiration and superivsion cues for use of compensatory swallow strategies.  SLP Short Term Goal 5 - Progress (Week 1): Met SLP Short Term Goal 6 (Week 1): Pt will cosnume trials of dysphagia 2 with minimal overt s/s of aspiration or dyphagia with Min A cues over 3 sessions to demonstrate readiness for diet upgrade.  SLP Short Term Goal 6 - Progress (Week 1): Not met    New Short Term Goals: Week 2: SLP Short Term Goal 1 (Week 2): Given verbal choice of 2 items, pt will verbalize choice in timely manner with Min A cues in 9 out of 10 opportunities.  SLP Short Term Goal 2 (Week 2): Given verbal choice of 2 items, pt will produce correct word intelligibility in 8 out of 10 opportunities with Min A cues.  SLP Short Term  Goal 3 (Week 2): Given Mod A cues, pt will follow 1 step directions in 8 out of 10 opportunties.  SLP Short Term Goal 4 (Week 2): Given Min A cues, pt will name common familiar items accuracy in 8 out of 10 opportunities.  SLP Short Term Goal 5 (Week 2): Pt will cosnume trials of dysphagia 2 with minimal overt s/s of aspiration or dyphagia with Min A cues over 3 sessions to demonstrate readiness for diet upgrade.   Weekly Progress Updates: Pt has made progress in skilled ST and as a result, pt has met 5 of 6 STGs. Pt with progress towards effective communication (as compared to baseline abilities), following simple contextually based directions, and consuming current diet. Skilled ST continues to be indicated to target increasing expressive and receptive abilities towards baseline and advancing her diet safely. Don't anticipate that pt will require follow up ST but anticipate that pt will require SNF placement d/t physical deficits that compromised pt's significant baseline cognitive linguistic deficits.      Intensity: Minumum of 1-2 x/day, 30 to 90 minutes Frequency: 3 to 5 out of 7 days Duration/Length of Stay: 2 to 3 weeks Treatment/Interventions: Patient/family education;Functional tasks;Speech/Language facilitation;Dysphagia/aspiration precaution training   Daily Session  Skilled Therapeutic Interventions: Skilled treatment session focused on communication and dysphagia goals. SLP facilitated session by providing skilled observation  of pt cosnuming trial snack of dysphagia 2 with thin liquids via cup. Pt with signs of increased efforted with masticating dysphagia 2, indicated by sighing and mild increase in oral phase. Will continue to trial. SLP also facilitated session by providing verbal choice of items from menu for meals. Pt able reply with intelligible choice within appropriate amount of time. Additionally, pt didn't display any evidence of perseverative motor patterns. Pt is making  progress.       General    Pain Pain Assessment Pain Scale: 0-10 Pain Score: 0-No pain  Therapy/Group: Individual Therapy  Thuan Tippett 12/14/2018, 12:13 PM

## 2018-12-14 NOTE — Progress Notes (Signed)
Occupational Therapy Weekly Progress Note  Patient Details  Name: [name] MRN: [id number] Date of Birth: 1921-11-09  Beginning of progress report period: December 08, 2018 End of progress report period: December 14, 2018  Today's Date: 12/14/2018 OT Individual Time: 1100-1130 OT Individual Time Calculation (min): 30 min    Patient has met 4 of 4 short term goals.   Pt is making steady progress with OT at this time and currently completes UB selfcare at a mod assist level, with LB selfcare at a mod to max assist level. Stand pivot transfers are at a max assist level to the bed and toilet at this time.  She still demonstrates increased fear with sit to stand as well as delayed initiation of sit to stand.  Increased pushing to the right side is also present with transitional movements such as sit to stand and stand pivot with increased posterior lean as well.  She is able to follow one step functional commands with mod assist overall but demonstrates more severe deficits with expressive speech.  RUE functional use is at a diminished level but she is able to use the hand functionally for most selfcare tasks with supervision and increased time, including self feeding.  Recommend continued OT to progress to min assist level goals for discharge.  Do not feel pt will be able to reach a level that she can transfer by herself or return back to independent living unless 24 hour supervision is available.  Will continue with current OT POC.   Patient continues to demonstrate the following deficits: muscle weakness, impaired timing and sequencing, unbalanced muscle activation and decreased coordination, decreased midline orientation, decreased memory and delayed processing and decreased sitting balance, decreased standing balance, decreased postural control and decreased balance strategies and therefore will continue to benefit from skilled OT intervention to enhance overall performance with BADL and Reduce  care partner burden.  Patient progressing toward long term goals..  Continue plan of care.  OT Short Term Goals Week 2:  OT Short Term Goal 1 (Week 2): Pt will complete toilet transfers stand pivot with or without RW and mod assist. OT Short Term Goal 2 (Week 2): Pt will complete UB dressing with min assist in supported sitting. OT Short Term Goal 3 (Week 2): Pt will complete LB bathing with min assist sit to stand.  OT Short Term Goal 4 (Week 2): Pt will complete LB dressing with min assist sit to stand.    Skilled Therapeutic Interventions/Progress Updates:     Pt worked on bathing and dressing during session sit to stand at the sink.  Max assist for supine to sit and for transfer stand pivot to the wheelchair to the right.  Decreased forward trunk flexion noted with attempted sit to stand during all tasks.  She was able to complete UB bathing with min assist and mod instructional cueing to sequence.  Mod assist for UB dressing as well to donn a pullover shirt as well as bra.  LB bathing was completed at a mod assist level with LB dressing at max assist.  Pt with decreased initiation of sit to stand at times, so she needs increased time and for sit to stand.  Once she begins to stand, she can complete with mod assist.  If you try to facilitate her before she initiates the needs total assist.  Finished session with call button and phone in reach and alarm belt in place.   Therapy Documentation Precautions:  Precautions Precautions: Fall Restrictions  Weight Bearing Restrictions: No  Pain: Pain Assessment Pain Scale: Faces Pain Score: 0-No pain ADL: See Care Tool Section of chart for details  Therapy/Group: Individual Therapy  Camry Theiss OTR/L 12/14/2018, 11:38 AM

## 2018-12-14 NOTE — Progress Notes (Signed)
Occupational Therapy Session Note  Patient Details  Name: [name] MRN: [id number] Date of Birth: 1922-06-28  Today's Date: 12/14/2018 OT Individual Time: 1100-1130 OT Individual Time Calculation (min): 30 min    Short Term Goals: Week 1:  OT Short Term Goal 1 (Week 1): Pt will tolerate OOB activity for 30 mins with 2 rest breaks to demonstrate increased activity tolerance OT Short Term Goal 2 (Week 1): Pt will complete bathing with max assist of 1 caregiver OT Short Term Goal 3 (Week 1): Pt will completed UB dressing with mod assist OT Short Term Goal 4 (Week 1): Pt will complete LB dressing with max assist of 1 caregiver  Skilled Therapeutic Interventions/Progress Updates:    Pt seen for OT session focusing on task initiation, anterior weight shift, and sit>stand. Pt sitting up in w/c upon arrival, appears pleasantly confused and shakes head denying pain.  Pt taken to therapy gym total A in w/c for time and energy conservation. Seated in w/c, completed table top activity of sorting bean bags. Pt able to follow one step commands and correctly identified ~90% of colored bean bags when cued. She was unable to successfully complete task when 2 step directions provided. With max cuing for initiation, pt demonstrated ability to use R UE to place bean bags in bin without need for physical assist. Pt required to anteriorly lean and cross midline to place items in bin in prep for more functional mobility.  She completed x2 sit>stand with mod A overall. R UE placed on table and using L UE to assist with pushing up from w/c. Pt able to maintain static standing balance at table while cont bean bag sorting activity with min A for standing balance overall. Mod a required for controlled descent into w/c when ready to sit.  Pt returned to room at end of session, left seated with chair belt alarm on and all needs in reach.   Therapy Documentation Precautions:  Precautions Precautions:  Fall Restrictions Weight Bearing Restrictions: No   Therapy/Group: Individual Therapy  [name] L 12/14/2018, 7:03 AM

## 2018-12-15 ENCOUNTER — Inpatient Hospital Stay (HOSPITAL_COMMUNITY): Payer: Medicare Other | Admitting: Speech Pathology

## 2018-12-15 ENCOUNTER — Inpatient Hospital Stay (HOSPITAL_COMMUNITY): Payer: Medicare Other | Admitting: Physical Therapy

## 2018-12-15 ENCOUNTER — Inpatient Hospital Stay (HOSPITAL_COMMUNITY): Payer: Medicare Other

## 2018-12-15 NOTE — Progress Notes (Signed)
Social Work Patient ID: [name], female   DOB: May 27, 1922, 85 y.o.   MRN: [id number]   CSW met with pt on 12-12-18 to update her on team conference discussion and that she is expected to stay about 3 weeks.  She expressed understanding.  CSW also talked with pt's son via telephone on 12-12-18 and 12-13-18 to update him, as well and to discuss d/c plan and new visitor restrictions.  Son feels pt will not be ready to return to independent living and will need a SNF stay after CIR.  Explained to him that team was thinking the same thing.  CSW will work with him and pt to facilitate d/c plan, return to [place] with caregivers vs. SNF.  CSW will continue to follow and assist as needed.

## 2018-12-15 NOTE — Progress Notes (Signed)
Physical Therapy Session Note  Patient Details  Name: [name] MRN: [id number] Date of Birth: 10/12/1921  Today's Date: 12/15/2018 PT Individual Time: 0900-0958 PT Individual Time Calculation (min): 58 min   Short Term Goals: Week 1:  PT Short Term Goal 1 (Week 1): Pt will transfer bed<>chair w/ mod assist +1 PT Short Term Goal 2 (Week 1): Pt will initiate gait training PT Short Term Goal 3 (Week 1): Pt will initiate w/c mobility training PT Short Term Goal 4 (Week 1): Pt will achieve midline orientation w/ cues 50% of the time   Skilled Therapeutic Interventions/Progress Updates:   Pt in supine and agreeable to therapy, no c/o or evidence of pain throughout session. Mod assist bed mobility and max assist stand pivot to w/c w/ RW. Multimodal cues for technique, side stepping, and upright posture. Total assist w/c mobility to therapy gym for time management. Worked on Personnel officer w/ RW, ambulated 20' and 30' w/ mod assist for upright balance and RW management. Blocked practice of sit<>stands to RW from w/c w/ emphasis on technique. Verbal and tactile cues to push up from armrests and needed only min-mod assist to boost into standing this technique vs max assist w/o UE push up. Manual and verbal cues to reach for armrest to slow descent onto chair. Performed 5+ sit<>stands in total. Faded to max assist w/ fatigue to boost into standing. Practiced sit<>stands to stedy for nursing use, max assist w/ UE push up and mod assist w/ UEs pulling up on stedy. Suspect pt fatiguing quickly throughout session, even when given frequent rest breaks. Pt self-propelled w/c back to room using BUEs, min assist for 150' 2/2 UE weakness, but pt able to attend to the task w/ min verbal cues. Ended session in w/c, all needs in reach.  Therapy Documentation Precautions:  Precautions Precautions: Fall Restrictions Weight Bearing Restrictions: No Vital Signs: Therapy Vitals Temp: 98.2 F (36.8 C) Pulse  Rate: 63 Resp: 16 BP: (!) 137/59 Oxygen Therapy SpO2: 97 % O2 Device: Room Air  Therapy/Group: Individual Therapy  [name] Demark 12/15/2018, 9:59 AM

## 2018-12-15 NOTE — Plan of Care (Signed)
  Problem: Consults Goal: RH STROKE PATIENT EDUCATION Description See Patient Education module for education specifics  Outcome: Progressing   Problem: RH BOWEL ELIMINATION Goal: RH STG MANAGE BOWEL WITH ASSISTANCE Description STG Manage Bowel with Mod/ Max Assistance.  Outcome: Progressing   Problem: RH BLADDER ELIMINATION Goal: RH STG MANAGE BLADDER WITH ASSISTANCE Description STG Manage Bladder With Mod/ Max Assistance  Outcome: Progressing   Problem: RH SKIN INTEGRITY Goal: RH STG SKIN FREE OF INFECTION/BREAKDOWN Description No new breakdown with min assist   Outcome: Progressing   Problem: RH SAFETY Goal: RH STG ADHERE TO SAFETY PRECAUTIONS W/ASSISTANCE/DEVICE Description STG Adhere to Safety Precautions With Mod Assistance/Device.  Outcome: Progressing   Problem: RH COGNITION-NURSING Goal: RH STG USES MEMORY AIDS/STRATEGIES W/ASSIST TO PROBLEM SOLVE Description STG Uses Memory Aids/Strategies With Min Assistance to Problem Solve.  Outcome: Progressing   Problem: RH PAIN MANAGEMENT Goal: RH STG PAIN MANAGED AT OR BELOW PT'S PAIN GOAL Description < 3 out of 10.   Outcome: Progressing   

## 2018-12-15 NOTE — Progress Notes (Signed)
Occupational Therapy Session Note  Patient Details  Name: [name] MRN: [id number] Date of Birth: 1922-08-12  Today's Date: 12/15/2018 OT Individual Time: 1530-1600 OT Individual Time Calculation (min): 30 min     Skilled Therapeutic Interventions/Progress Updates:    1;1. No pain indicated during session. Pt received in bed. Pt stand pivot transfer with MAX A with RW with VC for stepping feet and A for steering RW. Pt completes standing tolerance activities from w/c at high low table and kitchen countertop with MOD A for lifting to stand and tactile cues at chest to elevate for upright posture. Pt stands to fold towels and wash cloths for BUE coordination. Pt moves to countertop in kitchen to move dishes from counterop to cabinet to facilitate upright posture. Exited session with pt seated in w/c with belt alarm on and call light in reach and all needs met  Therapy Documentation Precautions:  Precautions Precautions: Fall Restrictions Weight Bearing Restrictions: No General:   Vital Signs:    Therapy/Group: Individual Therapy   Tonny Branch 12/15/2018, 12:56 PM

## 2018-12-15 NOTE — Progress Notes (Signed)
[name] is a 85 y.o. female who is admitted for CIR with functional deficits following left frontal/ACA infarction  Past Medical History:  Diagnosis Date  . Aphasia 01/09/2009  . Arthritis    right hand  . 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 CEREBROVASCULAR ACCIDENT, HX OF 11/20/2007  . CHF (congestive heart failure) ([place])   . Coronary artery disease   . CVA 01/09/2009  . DEMENTIA 05/19/2008  . Dementia ([place])   . DIVERTICULOSIS, COLON 11/20/2007  . FATIGUE 11/20/2007  . Femoral fracture ([place]) 07/01/2013  . GLUCOSE INTOLERANCE 12/06/2010  . HYPERLIPIDEMIA 11/20/2007  . HYPERTENSION 11/20/2007   Echo was done 07/07/11: mod LVH, EF 55-60%, grade 1 diast dysfxn, mild MR, mild LAE, mod TR, PASP 39  . HYPOTHYROIDISM 11/20/2007  . Impaired glucose tolerance 06/03/2011  . IRON DEFICIENCY 12/06/2010  . Pacemaker 2002  . PACEMAKER, PERMANENT 01/09/2009  . TIA 05/14/2003   elevated CE's in setting of TIA and falls in 10/12 - echo normal with no further cardiac w/u pursued  . TIA (transient ischemic attack) 10/10/2011  . Urinary incontinence 01/09/2009     Subjective: No new complaints. No new problems.  Limited due to a aphasia  Objective: Vital signs in last 24 hours: Temp:  [97.4 F (36.3 C)-98.2 F (36.8 C)] 98.2 F (36.8 C) (03/21 0640) Pulse Rate:  [59-67] 63 (03/21 0640) Resp:  [16-19] 16 (03/21 0640) BP: (122-137)/(55-59) 137/59 (03/21 0640) SpO2:  [97 %-99 %] 97 % (03/21 0640) Weight change:  Last BM Date: 12/14/18  Intake/Output from previous day: 03/20 0701 - 03/21 0700 In: 480 [P.O.:480] Out: -    Patient Vitals for the past 24 hrs:  BP Temp Temp src Pulse Resp SpO2  12/15/18 0640 (!) 137/59 98.2 F (36.8 C) - 63 16 97 %  12/14/18 1951 (!) 122/55 (!) 97.4 F (36.3 C) Oral 67 18 97 %  12/14/18 1427 (!) 133/58 (!) 97.5 F (36.4 C) Oral (!) 59 19 99 %     Physical Exam General: No apparent distress   HEENT: not dry Lungs: Normal effort. Lungs clear to  auscultation, no crackles or wheezes. Cardiovascular: Regular rate and rhythm, no edema Abdomen: S/NT/ND; BS(+) Musculoskeletal:  unchanged Neurological: No new neurological deficits; aphasia unchanged with perseveration Wounds: N/A    Skin: clear no rash Mental state: Alert, oriented, cooperative    Lab Results: BMET    Component Value Date/Time   NA 138 12/12/2018 0438   K 3.7 12/12/2018 0438   CL 103 12/12/2018 0438   CO2 28 12/12/2018 0438   GLUCOSE 117 (H) 12/12/2018 0438   BUN 22 12/12/2018 0438   CREATININE 0.78 12/12/2018 0438   CALCIUM 8.5 (L) 12/12/2018 0438   GFRNONAA >60 12/12/2018 0438   GFRAA >60 12/12/2018 0438   CBC    Component Value Date/Time   WBC 7.8 12/12/2018 0438   RBC 4.45 12/12/2018 0438   HGB 13.6 12/12/2018 0438   HCT 41.3 12/12/2018 0438   PLT 328 12/12/2018 0438   MCV 92.8 12/12/2018 0438   MCH 30.6 12/12/2018 0438   MCHC 32.9 12/12/2018 0438   RDW 12.3 12/12/2018 0438   LYMPHSABS 1.8 12/08/2018 0643   MONOABS 0.9 12/08/2018 0643   EOSABS 0.4 12/08/2018 0643   BASOSABS 0.1 12/08/2018 0643    Medications: I have reviewed the patient's current medications.  Assessment/Plan:  Functional deficits secondary to left frontal infarction.  Continue CIR Essential hypertension.  Blood pressure well controlled Dyslipidemia continue statin  therapy    Length of stay, days: 8  Marletta Lor , MD 12/15/2018, 10:04 AM

## 2018-12-15 NOTE — Progress Notes (Signed)
Social Work Assessment and Plan  Patient Details  Name: FATE GALANTI MRN: [id number] Date of Birth: [birth year]  Today's Date: 12/10/2018  Problem List:  Patient Active Problem List   Diagnosis Date Noted  . Acute right ACA stroke ([place]) 12/07/2018  . Stroke (cerebrum) ([place]) 12/03/2018  . Cough 10/11/2018  . Abnormal breath sounds 10/11/2018  . Closed nondisplaced fracture of proximal phalanx of lesser toe of right foot 06/26/2018  . Right ankle pain 06/21/2018  . Right foot pain 06/21/2018  . Abnormal CXR 04/26/2017  . Lobar pneumonia ([place]) 01/20/2017  . Acute on chronic respiratory failure with hypoxia ([place]) 01/17/2017  . Acute respiratory failure with hypoxia ([place]) 01/17/2017  . DOE (dyspnea on exertion) 10/23/2014  . Hyponatremia 10/23/2014  . RLL pneumonia ([place]) 10/11/2013  . Other specified anemias 10/10/2013  . Acute respiratory failure ([place]) 08/15/2013  . Aspiration pneumonia ([place]) 08/15/2013  . Altered mental status 08/15/2013  . Incarcerated femoral hernia 08/14/2013  . Esophageal reflux 08/05/2013  . Unspecified constipation 07/03/2013  . Closed fracture of left hip ([place]) 07/01/2013  . 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 Atrial fibrillation ([place]) 10/30/2012  . Low back pain 10/26/2012  . Syncope 10/26/2012  . PNA (pneumonia) 09/26/2012  . UTI (lower urinary tract infection) 09/26/2012  . Hypokalemia 09/26/2012  . Leukocytosis 09/26/2012  . Cardiac enzymes elevated 07/25/2011  . Impaired glucose tolerance 06/03/2011  . Preventative health care 06/03/2011  . IRON DEFICIENCY 12/06/2010  . 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 CVA (cerebral vascular accident) ([place]) 01/09/2009  . Aphasia 01/09/2009  . Urinary incontinence 01/09/2009  . PACEMAKER, PERMANENT 01/09/2009  . Dementia ([place]) 05/19/2008  . Hypothyroidism 11/20/2007  . Hyperlipidemia 11/20/2007  . Essential hypertension 11/20/2007  . DIVERTICULOSIS, COLON 11/20/2007  . FATIGUE  11/20/2007  . CEREBROVASCULAR ACCIDENT, HX OF 11/20/2007  . TIA 05/14/2003   Past Medical History:  Past Medical History:  Diagnosis Date  . Aphasia 01/09/2009  . Arthritis    right hand  . 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 CEREBROVASCULAR ACCIDENT, HX OF 11/20/2007  . CHF (congestive heart failure) ([place])   . Coronary artery disease   . CVA 01/09/2009  . DEMENTIA 05/19/2008  . Dementia ([place])   . DIVERTICULOSIS, COLON 11/20/2007  . FATIGUE 11/20/2007  . Femoral fracture ([place]) 07/01/2013  . GLUCOSE INTOLERANCE 12/06/2010  . HYPERLIPIDEMIA 11/20/2007  . HYPERTENSION 11/20/2007   Echo was done 07/07/11: mod LVH, EF 55-60%, grade 1 diast dysfxn, mild MR, mild LAE, mod TR, PASP 39  . HYPOTHYROIDISM 11/20/2007  . Impaired glucose tolerance 06/03/2011  . IRON DEFICIENCY 12/06/2010  . Pacemaker 2002  . PACEMAKER, PERMANENT 01/09/2009  . TIA 05/14/2003   elevated CE's in setting of TIA and falls in 10/12 - echo normal with no further cardiac w/u pursued  . TIA (transient ischemic attack) 10/10/2011  . Urinary incontinence 01/09/2009   Past Surgical History:  Past Surgical History:  Procedure Laterality Date  . ABDOMINAL HYSTERECTOMY    . CHOLECYSTECTOMY    . CYSTOCELE REPAIR N/A 11/26/2012   Procedure: Cystocele Repair with Lefort Colpocleisis.  colpectomy Levatorplasty Perineorraphy;  Surgeon: Lovenia Kim, MD;  Location: [facility];  Service: Gynecology;  Laterality: N/A;  Cystocele Repair with Lefort Colpocleisis.  Marland Kitchen HIP ARTHROPLASTY Left 07/02/2013   Procedure: ARTHROPLASTY BIPOLAR HIP;  Surgeon: Mauri Pole, MD;  Location: [facility];  Service: Orthopedics;  Laterality: Left;  . INGUINAL HERNIA REPAIR Right 08/13/2013   Procedure: HERNIA REPAIR  INGUINAL INCARCERATED;  Surgeon: Ralene Ok, MD;  Location: [facility];  Service: General;  Laterality: Right;  . Medtronic Cynthia dual chamber pacemaker serial number was [id number] H...04/15/2009    . s/p cerebral aneurysm  1991  . s/p pacemaker DDD    .  s/p retinal attachment    . s/p thyroidectomy     Social History:  reports that she has never smoked. She has never used smokeless tobacco. She reports that she does not drink alcohol or use drugs.  Family / Support Systems Marital Status: Widow/Widower How Long?: many years Patient Roles: Other (Comment), Parent(mother-in-law) Children: Juanda Crumble (Chas) Raczkowski - son - [phone] Other Supports: Vester Titsworth - dtr-in-law - [phone] Anticipated Caregiver: hired assist; son and daughter willing to hire assist through [place]; already receives some services at baseline Ability/Limitations of Caregiver: Min A Caregiver Availability: 24/7 Family Dynamics: close, supportive family  Social History Preferred language: English Religion: Methodist Read: Yes Write: Yes Employment Status: Retired Public relations account executive Issues: none reported Guardian/Conservator: MD has determined that pt is not fully capable of making her own decisions.  Son is next of kin for decision making.   Abuse/Neglect Abuse/Neglect Assessment Can Be Completed: Yes Physical Abuse: Denies Verbal Abuse: Denies Sexual Abuse: Denies Exploitation of patient/patient's resources: Denies Self-Neglect: Denies  Emotional Status Pt's affect, behavior and adjustment status: Pt's aphasia made it difficult to assess for this.  CSW will continue to monitor.  Son is not concerned. Recent Psychosocial Issues: Pt has been at [facility] for 12 1/2 years. Psychiatric History: Pt with a hx of dementia. Substance Abuse History: none reported  Patient / Family Perceptions, Expectations & Goals Pt/Family understanding of illness & functional limitations: Pt's son has an excellent understanding of pt's condition and limitations.  Pt has had previous strokes in the past. Premorbid pt/family roles/activities: Pt would watch TV, "work" around apartment, take meals in the dining room Anticipated changes in  roles/activities/participation: Pt would like to resume this activities when she is able, but she will likely need SNF care after CIR for  a little bit. Pt/family expectations/goals: Pt was not able to state this, but son just wants her to get the best care and most therapy she can.  Community Resources [employer]: Other (Comment)([place]; [facility]; [facility]) Premorbid Home Care/DME Agencies: Other (Comment)(unknown who previous agency was) Transportation available at discharge: son vs. non emergent ambulance Resource referrals recommended: Neuropsychology, Support group (specify)(stroke support group)  Discharge Planning Living Arrangements: Alone Support Systems: Children Type of Residence: Independent Living Insurance Resources: Medicare, Multimedia programmer (specify)([employer] as secondary) Financial Resources: Radio broadcast assistant Screen Referred: No Living Expenses: Other (Comment)([facility]) Money Management: Family Does the patient have any problems obtaining your medications?: No Home Management: Facility takes care of this.  Pt would do minor things. Patient/Family Preliminary Plans: Pt's son feels pt will need ST SNF before trying to return to [facility]. Social Work Anticipated Follow Up Needs: HH/OP, SNF, ALF/IL Expected length of stay: about 3 weeks  Clinical Impression CSW met with pt and then spoke with son via telephone to introduce self and role of CSW, as well as to complete assessment.  Pt's son is concerned pt will need more care than can be provided with a caregiver at [facility].  He'd like to see how she progresses, but then feels she may need SNF.  Pt's aphasia from this stroke and previous strokes makes it difficult to receive answers, so pt's son provided most of the  answers to CSW's questions.  CSW will continue to follow and assist as needed.  Azahel Belcastro, Silvestre Mesi 12/15/2018, 1:11 AM

## 2018-12-15 NOTE — Progress Notes (Signed)
Speech Language Pathology Daily Session Note  Patient Details  Name: [name] MRN: [id number] Date of Birth: 15-Nov-1921  Today's Date: 12/15/2018 SLP Individual Time: 1035-1050 SLP Individual Time Calculation (min): 15 min  Short Term Goals: Week 2: SLP Short Term Goal 1 (Week 2): Given verbal choice of 2 items, pt will verbalize choice in timely manner with Min A cues in 9 out of 10 opportunities.  SLP Short Term Goal 2 (Week 2): Given verbal choice of 2 items, pt will produce correct word intelligibility in 8 out of 10 opportunities with Min A cues.  SLP Short Term Goal 3 (Week 2): Given Mod A cues, pt will follow 1 step directions in 8 out of 10 opportunties.  SLP Short Term Goal 4 (Week 2): Given Min A cues, pt will name common familiar items accuracy in 8 out of 10 opportunities.  SLP Short Term Goal 5 (Week 2): Pt will cosnume trials of dysphagia 2 with minimal overt s/s of aspiration or dyphagia with Min A cues over 3 sessions to demonstrate readiness for diet upgrade.   Skilled Therapeutic Interventions:  Pt was seen for skilled ST targeting dysphagia goals.  SLP facilitated the session with trials of dys 2 and 3 textures to continue working towards diet progression.  Pt demonstrated timely mastication of advanced solids but did have mild diffuse residuals in the oral cavity post swallow which cleared with cues for use of liquid wash.  No overt s/s of aspiration with solids or liquids.  Will defer diet advancement to primary speech therapist's discretion but pt seems to be making progress towards dys 2 advancement.  Minimal verbalizations or attempts to communicate appreciated on this date.  Pt was left in chair with chair alarm set and call bell within reach.  Continue per current plan of care.      Pain Pain Assessment Pain Scale: Faces Faces Pain Scale: No hurt  Therapy/Group: Individual Therapy  Myrtha Tonkovich, Selinda Orion 12/15/2018, 10:53 AM

## 2018-12-16 NOTE — Plan of Care (Signed)
  Problem: Consults Goal: RH STROKE PATIENT EDUCATION Description See Patient Education module for education specifics  Outcome: Progressing   Problem: RH BOWEL ELIMINATION Goal: RH STG MANAGE BOWEL WITH ASSISTANCE Description STG Manage Bowel with Mod/ Max Assistance.  Outcome: Progressing   Problem: RH BLADDER ELIMINATION Goal: RH STG MANAGE BLADDER WITH ASSISTANCE Description STG Manage Bladder With Mod/ Max Assistance  Outcome: Progressing   Problem: RH SKIN INTEGRITY Goal: RH STG SKIN FREE OF INFECTION/BREAKDOWN Description No new breakdown with min assist   Outcome: Progressing   Problem: RH SAFETY Goal: RH STG ADHERE TO SAFETY PRECAUTIONS W/ASSISTANCE/DEVICE Description STG Adhere to Safety Precautions With Mod Assistance/Device.  Outcome: Progressing   Problem: RH COGNITION-NURSING Goal: RH STG USES MEMORY AIDS/STRATEGIES W/ASSIST TO PROBLEM SOLVE Description STG Uses Memory Aids/Strategies With Min Assistance to Problem Solve.  Outcome: Progressing   Problem: RH PAIN MANAGEMENT Goal: RH STG PAIN MANAGED AT OR BELOW PT'S PAIN GOAL Description < 3 out of 10.   Outcome: Progressing   

## 2018-12-16 NOTE — Progress Notes (Signed)
[name] is a 85 y.o. female who is admitted for CIR with functional deficits following left frontal/ACA infarct  Past Medical History:  Diagnosis Date  . Aphasia 01/09/2009  . Arthritis    right hand  . 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 CEREBROVASCULAR ACCIDENT, HX OF 11/20/2007  . CHF (congestive heart failure) ([place])   . Coronary artery disease   . CVA 01/09/2009  . DEMENTIA 05/19/2008  . Dementia ([place])   . DIVERTICULOSIS, COLON 11/20/2007  . FATIGUE 11/20/2007  . Femoral fracture ([place]) 07/01/2013  . GLUCOSE INTOLERANCE 12/06/2010  . HYPERLIPIDEMIA 11/20/2007  . HYPERTENSION 11/20/2007   Echo was done 07/07/11: mod LVH, EF 55-60%, grade 1 diast dysfxn, mild MR, mild LAE, mod TR, PASP 39  . HYPOTHYROIDISM 11/20/2007  . Impaired glucose tolerance 06/03/2011  . IRON DEFICIENCY 12/06/2010  . Pacemaker 2002  . PACEMAKER, PERMANENT 01/09/2009  . TIA 05/14/2003   elevated CE's in setting of TIA and falls in 10/12 - echo normal with no further cardiac w/u pursued  . TIA (transient ischemic attack) 10/10/2011  . Urinary incontinence 01/09/2009   '  Subjective: No new complaints. No new problems. Slept well.   Objective: Vital signs in last 24 hours: Temp:  [97.9 F (36.6 C)] 97.9 F (36.6 C) (03/22 0514) Pulse Rate:  [61-67] 61 (03/22 0514) Resp:  [18-20] 20 (03/22 0514) BP: (136-141)/(54-59) 141/59 (03/22 0514) SpO2:  [96 %-98 %] 98 % (03/22 0514) Weight change:  Last BM Date: 12/15/18  Intake/Output from previous day: 03/21 0701 - 03/22 0700 In: 240 [P.O.:240] Out: -   Patient Vitals for the past 24 hrs:  BP Temp Pulse Resp SpO2  12/16/18 0514 (!) 141/59 97.9 F (36.6 C) 61 20 98 %  12/15/18 1952 (!) 136/56 97.9 F (36.6 C) 67 18 96 %  12/15/18 1402 (!) 137/54 - 61 20 97 %     Physical Exam General: No apparent distress   HEENT: not dry Lungs: Normal effort. Lungs clear to auscultation, no crackles or wheezes. Cardiovascular: Regular rate and rhythm, no  edema Abdomen: S/NT/ND; BS(+) Musculoskeletal:  unchanged Neurological: No new neurological deficits;  expressive aphasia Wounds: N/A    Skin: clear   Mental state: Alert, oriented, cooperative    Lab Results: BMET    Component Value Date/Time   NA 138 12/12/2018 0438   K 3.7 12/12/2018 0438   CL 103 12/12/2018 0438   CO2 28 12/12/2018 0438   GLUCOSE 117 (H) 12/12/2018 0438   BUN 22 12/12/2018 0438   CREATININE 0.78 12/12/2018 0438   CALCIUM 8.5 (L) 12/12/2018 0438   GFRNONAA >60 12/12/2018 0438   GFRAA >60 12/12/2018 0438   CBC    Component Value Date/Time   WBC 7.8 12/12/2018 0438   RBC 4.45 12/12/2018 0438   HGB 13.6 12/12/2018 0438   HCT 41.3 12/12/2018 0438   PLT 328 12/12/2018 0438   MCV 92.8 12/12/2018 0438   MCH 30.6 12/12/2018 0438   MCHC 32.9 12/12/2018 0438   RDW 12.3 12/12/2018 0438   LYMPHSABS 1.8 12/08/2018 0643   MONOABS 0.9 12/08/2018 0643   EOSABS 0.4 12/08/2018 0643   BASOSABS 0.1 12/08/2018 0643    Medications: I have reviewed the patient's current medications.  Assessment/Plan:  Functional deficits following left frontal infarction.  Continue CIR with speech therapy OT and PT Essential hypertension.  Blood pressure remains well controlled Dyslipidemia continue statin therapy    Length of stay, days: [address] ,  MD 12/16/2018, 9:36 AM

## 2018-12-17 ENCOUNTER — Inpatient Hospital Stay (HOSPITAL_COMMUNITY): Payer: Medicare Other

## 2018-12-17 ENCOUNTER — Inpatient Hospital Stay (HOSPITAL_COMMUNITY): Payer: Medicare Other | Admitting: Occupational Therapy

## 2018-12-17 ENCOUNTER — Inpatient Hospital Stay (HOSPITAL_COMMUNITY): Payer: Self-pay

## 2018-12-17 NOTE — Progress Notes (Signed)
Occupational Therapy Session Note  Patient Details  Name: [name] MRN: [id number] Date of Birth: 09-28-1921  Today's Date: 12/17/2018 OT Individual Time: 2423-5361 OT Individual Time Calculation (min): 70 min    Short Term Goals: Week 2:  OT Short Term Goal 1 (Week 2): Pt will complete toilet transfers stand pivot with or without RW and mod assist. OT Short Term Goal 2 (Week 2): Pt will complete UB dressing with min assist in supported sitting. OT Short Term Goal 3 (Week 2): Pt will complete LB bathing with min assist sit to stand.  OT Short Term Goal 4 (Week 2): Pt will complete LB dressing with min assist sit to stand.    Skilled Therapeutic Interventions/Progress Updates:    Pt completed bathing and dressing sit to stand at the sink during session per her choice.  She needed max instructional cueing to sequence bathing and for squeezing out the washcloth, as initially she just let it drip down into the floor.  She completed UB bathing with supervision and LB bathing with mod assist sit to stand.  Increased trunk and hip flexion noted in standing.  She needed mod demonstrational cueing for orientation of shirt, as she kept trying to put her arm through the neck hole.  Once it was oriented, she was able to complete it with supervision.  Max assist was needed for fastening bra prior to donning shirt.  She donned brief and pants with max assist but donned socks with supervision.  Finished session with grooming tasks at wheelchair level with setup for brushing her teeth and her hair.  Pt left in wheelchair with call button and phone in reach and safety alarm belt in place.                                        Therapy Documentation Precautions:  Precautions Precautions: Fall Restrictions Weight Bearing Restrictions: No  Pain: Pain Assessment Pain Scale: Faces Pain Score: 0-No pain ADL: See Care Tool Section for some details of ADL  Therapy/Group: Individual  Therapy  Sophi Calligan OTR/L 12/17/2018, 12:01 PM

## 2018-12-17 NOTE — Progress Notes (Signed)
[name]   Subjective/Complaints:  Intake 50-75%    ROS: limited due to language/communication    Objective:   No results found. No results for input(s): WBC, HGB, HCT, PLT in the last 72 hours. No results for input(s): NA, K, CL, CO2, GLUCOSE, BUN, CREATININE, CALCIUM in the last 72 hours.  Intake/Output Summary (Last 24 hours) at 12/17/2018 0657 Last data filed at 12/16/2018 2059 Gross per 24 hour  Intake 660 ml  Output -  Net 660 ml     Physical Exam: Vital Signs Blood pressure (!) 141/55, pulse 60, temperature 97.8 F (36.6 C), temperature source Oral, resp. rate 16, height 5\' 5"  (1.651 m), weight 65.2 kg, SpO2 97 %. Constitutional: No distress . Vital signs reviewed. HEENT: EOMI, oral membranes moist Neck: supple Cardiovascular: RRR without murmur. No JVD    Respiratory: CTA Bilaterally without wheezes or rales. Normal effort    GI: BS +, non-tender, non-distended  Neurological: She isalert. Expressive greater than receptive aphasia persists. Followed most simple commands although usually with delay.  Apraxic. RUE 4/5 and 3- RLE  LUE and LLE 4 to 4+/5. Psych:drowsy but awakens to physical stim from sleep    Assessment/Plan: 1. Functional deficits secondary to left frontal infarct which require 3+ hours per day of interdisciplinary therapy in a comprehensive inpatient rehab setting.  Physiatrist is providing close team supervision and 24 hour management of active medical problems listed below.  Physiatrist and rehab team continue to assess barriers to discharge/monitor patient progress toward functional and medical goals  Care Tool:  Bathing  Bathing activity did not occur: Safety/medical concerns Body parts bathed by patient: Right arm, Left arm, Chest, Abdomen, Face, Right upper leg, Left upper leg, Right lower leg, Left lower leg   Body parts bathed by helper: Right arm, Left arm, Front perineal area,  Buttocks, Right upper leg, Left upper leg, Right lower leg, Left lower leg     Bathing assist Assist [name]: Moderate Assistance - Patient 50 - 74%     Upper Body Dressing/Undressing Upper body dressing   What is the patient wearing?: Bra, Pull over shirt    Upper body assist Assist [name]: Moderate Assistance - Patient 50 - 74%    Lower Body Dressing/Undressing Lower body dressing      What is the patient wearing?: Incontinence brief, Pants     Lower body assist Assist for lower body dressing: Maximal Assistance - Patient 25 - 49%     Toileting Toileting    Toileting assist Assist for toileting: 2 Helpers     Transfers Chair/bed transfer  Transfers assist     Chair/bed transfer assist [name]: Maximal Assistance - Patient 25 - 49%     Locomotion Ambulation   Ambulation assist   Ambulation activity did not occur: Safety/medical concerns  Assist [name]: Moderate Assistance - Patient 50 - 74% Assistive device: Walker-rolling Max distance: 30'   Walk 10 feet activity   Assist  Walk 10 feet activity did not occur: Safety/medical concerns  Assist [name]: Moderate Assistance - Patient - 50 - 74% Assistive device: Walker-rolling   Walk 50 feet activity   Assist Walk 50 feet with 2 turns activity did not occur: Safety/medical concerns         Walk 150 feet activity   Assist Walk 150 feet activity did not occur: Safety/medical concerns         Walk 10 feet on uneven surface  activity   Assist Walk  10 feet on uneven surfaces activity did not occur: Safety/medical concerns         Wheelchair     Assist Will patient use wheelchair at discharge?: Yes Type of Wheelchair: Manual Wheelchair activity did not occur: Safety/medical concerns  Wheelchair assist [name]: Minimal Assistance - Patient > 75% Max wheelchair distance: 150'    Wheelchair 50 feet with 2 turns activity    Assist    Wheelchair 50 feet with 2 turns activity did not  occur: Safety/medical concerns   Assist [name]: Minimal Assistance - Patient > 75%   Wheelchair 150 feet activity     Assist Wheelchair 150 feet activity did not occur: Safety/medical concerns   Assist [name]: Minimal Assistance - Patient > 75%    Medical Problem List and Plan: 1.Functional declinesecondary toLeft frontal/ACA infarct.             --Continue CIR therapies including PT, OT, and SLP, aphasia at baseline per SLP , .2. Antithrombotics: -DVT/anticoagulation:Pharmaceutical:Lovenox -antiplatelet therapy: Low dose ASA 3. Pain Management:Tylenol prn 4. Mood:LCSW to follow for evaluation and support. -antipsychotic agents: N/A 5. Neuropsych: This patientis not fullycapable of making decisions on onown behalf. 6. Skin/Wound Care:routine pressure relief measures. 7. Fluids/Electrolytes/Nutrition:encourage PO  -D1 thin liquids currently   -sl hypokalemia--supplementing KCL 60meq daily   8. HTN: Monitor BP bid. Continue Amlodipine and Cozaar daily  -mild elevation this am no change to meds at this time Vitals:   12/16/18 1953 12/17/18 0317  BP: (!) 132/53 (!) 141/55  Pulse: 65 60  Resp: 14 16  Temp: 98 F (36.7 C) 97.8 F (36.6 C)  SpO2: 94% 97%  HTN controlled 3/23 9. Dyslipidemia: On pravastatin.  10. 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   11. Mild hypo K supplement orally 12.  Dysphagia post stroke on pureed diet- intake is good LOS: 10 days A FACE TO FACE EVALUATION WAS PERFORMED  Charlett Blake 12/17/2018, 6:57 AM

## 2018-12-17 NOTE — Progress Notes (Signed)
Speech Language Pathology Daily Session Note  Patient Details  Name: RONNETTA CURRINGTON MRN: [id number] Date of Birth: [birth year]  Today's Date: 12/17/2018 SLP Individual Time: 1303-1400 SLP Individual Time Calculation (min): 57 min  Short Term Goals: Week 2: SLP Short Term Goal 1 (Week 2): Given verbal choice of 2 items, pt will verbalize choice in timely manner with Min A cues in 9 out of 10 opportunities.  SLP Short Term Goal 2 (Week 2): Given verbal choice of 2 items, pt will produce correct word intelligibility in 8 out of 10 opportunities with Min A cues.  SLP Short Term Goal 3 (Week 2): Given Mod A cues, pt will follow 1 step directions in 8 out of 10 opportunties.  SLP Short Term Goal 4 (Week 2): Given Min A cues, pt will name common familiar items accuracy in 8 out of 10 opportunities.  SLP Short Term Goal 5 (Week 2): Pt will cosnume trials of dysphagia 2 with minimal overt s/s of aspiration or dyphagia with Min A cues over 3 sessions to demonstrate readiness for diet upgrade.   Skilled Therapeutic Interventions: Skilled ST services focused on language and swallow skills. SLP ordered dys 2 lunch tray, left note posted on door, however was unable to locate nurse to communicate need to save upgraded tray for ST session. Per nurse, pt consumed 100% dys 2 tray with no overt s/s aspiration. SLP facilitated PO consumption trial of dys 2, at the end of session, Pt demonstrated slightly prolonged mastication however functional oral clearance and no overt s/s aspiration. SLP upgraded pt to dys 2 textures, following demonstration of swallow strategies over multiple session, however would recommend to assess tolerance during meal tray in future sessions. SLP facilitated ability to express perferances of meal choices, given two options at a time in a timely manner with min A verbal cues for clarfication in 9 out 10 opportunities and pt demonstrated intelligibility production of words with 90%  intelligibility. SLP and pt collaborated ( in response to two choices and yes/no questions) to create meal preference sheet, posted in room and SLP communication locate of sheet as well as placing next meal order with dietary staff via phone.  Pt was left in room with call bell within reach and chair alarm set. ST recommends to continue skilled ST services.      Pain Pain Assessment Pain Score: 0-No pain  Therapy/Group: Individual Therapy  Julizza Sassone  [facility] 12/17/2018, 4:40 PM

## 2018-12-17 NOTE — Progress Notes (Signed)
Slept well throughout the night. Given prn trazodone 50 mg at hs for sleep-effective. Incontinent of urine x1 on this shift. Denies pain. Took meds without difficulty.

## 2018-12-17 NOTE — Progress Notes (Addendum)
Physical Therapy Weekly Progress Note  Patient Details  Name: [name] MRN: [id number] Date of Birth: [birth year]  Beginning of progress report period: December 08, 2018 End of progress report period: December 17, 2018  Today's Date: 12/17/2018 PT Individual Time: 0802-0915 PT Individual Time Calculation (min): 73 min   Patient has met 3 of 4 short term goals. Patient progressing well with therapy from performing supine<>sit with max A, sit<>stand with total assist and unable to progress to ambulation at initial evaluation. Pt is now performing supine<>sit with min/mod A, sit<>stand using stedy/RW to mod/max A and to ambulating at most 72f using RW with mod A. Patient continues to demonstrate impaired initiation of tasks, impaired strength, standing balance, midline orientation, and activity tolerance resulting in decreased independence performing supine<>sit, sit<>stand tranfers, stand pivot transfers, and ambulation.  Patient continues to demonstrate the following deficits muscle weakness, decreased cardiorespiratoy endurance, impaired timing and sequencing, unbalanced muscle activation, motor apraxia, decreased coordination and decreased motor planning, decreased midline orientation and decreased motor planning, decreased initiation, decreased attention, decreased awareness, decreased problem solving, decreased safety awareness and delayed processing and decreased sitting balance, decreased standing balance, decreased postural control and decreased balance strategies and therefore will continue to benefit from skilled PT intervention to increase functional independence with mobility.  Patient progressing toward long term goals..  Continue plan of care.  PT Short Term Goals Week 1:  PT Short Term Goal 1 (Week 1): Pt will transfer bed<>chair w/ mod assist +1 PT Short Term Goal 1 - Progress (Week 1): Progressing toward goal PT Short Term Goal 2 (Week 1): Pt will initiate gait training PT  Short Term Goal 2 - Progress (Week 1): Met PT Short Term Goal 3 (Week 1): Pt will initiate w/c mobility training PT Short Term Goal 3 - Progress (Week 1): Met PT Short Term Goal 4 (Week 1): Pt will achieve midline orientation w/ cues 50% of the time  PT Short Term Goal 4 - Progress (Week 1): Met Week 2:  PT Short Term Goal 1 (Week 2): Pt will ambulate at least 584fusing LRAD with no more than mod A PT Short Term Goal 2 (Week 2): Pt will consistently perform sit<>stand transfers using LRAD with no more than mod A PT Short Term Goal 3 (Week 2): Pt will stand statically using LRAD for 2 minutes with no more than min A  Skilled Therapeutic Interventions/Progress Updates:  Ambulation/gait training;Discharge planning;Cognitive remediation/compensation;DME/adaptive equipment instruction;Functional mobility training;Pain management;Psychosocial support;Splinting/orthotics;Therapeutic Activities;UE/LE Strength taining/ROM;Visual/perceptual remediation/compensation;UE/LE Coordination activities;Wheelchair propulsion/positioning;Stair training;Therapeutic Exercise;Patient/family education;Neuromuscular re-education;Skin care/wound management;Functional electrical stimulation;Disease management/prevention;Community reintegration;Balance/vestibular training   Pt received sitting up in bed with nursing staff present and pt eating breakfast. Pt agreeable to therapy session and assisted to EOB, using bed features, with min A for trunk upright and min A for scooting to EOB with multimodal cuing for improved pt initiation and amplitude of movement. Pt worked on sitting balance EOB while finishing breakfast. Performed sit to stand using RW from EOB with max A of 2 and significantly increased time for initiation of task with assist for lifting and balance upon coming to standing with pt demonstrating posterior lean in stance. Performed stand pivot transfer to w/c using RW with max A of 2 for balance and AD management.  Transitioned to using stedy with pt performing repeated sit<>stand from elevated surface in stedy with max A progressed to min A and from lower surface of w/c in stedy with mod A. Pt transferred to bathroom using stedy  with continent bowel and bladder and +2 mod A for sit<>stand and total assist for LB clothing management and peri-care. Pt performed standing balance activities at sink in stedy with UE support varying between B and unilateral UE -  verbal/tactile cuing with visual feedback for midline orientation, R LE knee extension and trunk extension for improved upright stance. Pt performed sit<>stand from w/c to RW with max A and increased time for initiation of task with multimodal cues. Pt ambulated ~79f using RW with mod A for balance - demonstrated decreased step length (R>L) with step-to on the R LE and decreased L LE weight shift for stance phase - multimodal cuing for improved gait mechanics with pt demonstrating resistance to manual facilitation for L weight shift. Pt performed 986fof w/c propulsion using  BUEs with 1 full turn and 1 half turn - cuing for proper UE placement and use of UEs to perform turns with mod A for turning and min A for forward propulsion. Pt left sitting in w/c with needs in reach and seat belt alarm on.  Therapy Documentation Precautions:  Precautions Precautions: Fall Restrictions Weight Bearing Restrictions: No  Pain:  Pt occasionally verbalizing "oh" during transfers possibly indicating pain during mobility but pt unable to verbalize additional details due to impaired communication. Pt provided rest breaks throughout session for pain management.    Therapy/Group: Individual Therapy  CaTawana ScalePT, DPT  EmNetta CorriganPT, DPT 12/17/18  12:21 PM   12/17/2018, 9:38 AM

## 2018-12-18 ENCOUNTER — Inpatient Hospital Stay (HOSPITAL_COMMUNITY): Payer: Medicare Other

## 2018-12-18 ENCOUNTER — Inpatient Hospital Stay (HOSPITAL_COMMUNITY): Payer: Medicare Other | Admitting: Occupational Therapy

## 2018-12-18 ENCOUNTER — Inpatient Hospital Stay (HOSPITAL_COMMUNITY): Payer: Medicare Other | Admitting: Physical Therapy

## 2018-12-18 ENCOUNTER — Inpatient Hospital Stay (HOSPITAL_COMMUNITY): Payer: Self-pay

## 2018-12-18 NOTE — Progress Notes (Signed)
Speech Language Pathology Daily Session Note  Patient Details  Name: [name] MRN: [id number] Date of Birth: 06-Aug-1922  Today's Date: 12/18/2018 SLP Individual Time: 0900-0930 / 730-800 SLP Individual Time Calculation (min): 30 min and 30 min  Short Term Goals: Week 2: SLP Short Term Goal 1 (Week 2): Given verbal choice of 2 items, pt will verbalize choice in timely manner with Min A cues in 9 out of 10 opportunities.  SLP Short Term Goal 2 (Week 2): Given verbal choice of 2 items, pt will produce correct word intelligibility in 8 out of 10 opportunities with Min A cues.  SLP Short Term Goal 3 (Week 2): Given Mod A cues, pt will follow 1 step directions in 8 out of 10 opportunties.  SLP Short Term Goal 4 (Week 2): Given Min A cues, pt will name common familiar items accuracy in 8 out of 10 opportunities.  SLP Short Term Goal 5 (Week 2): Pt will cosnume trials of dysphagia 2 with minimal overt s/s of aspiration or dyphagia with Min A cues over 3 sessions to demonstrate readiness for diet upgrade.   Skilled Therapeutic Interventions: 1# Skilled ST services focused on swallow skills. SLP repositioned pt in bed for PO consumption. SLP facilitated PO consumption of breakfast tray dys 3 and thin liquids, pt demonstrated mild oral residue, cleared with lingual sweeps and liquid wash and no overt s/s aspiration with min cues to alternate liquids/soilds and min physical assist accessing items on tray. SLP recommends to continue full supervision on dys 2 textured diet and focus dysphagia treatment on tolerance as well as continued solid advancement. SLP noting piror level of PO consumption was mainly unsupervised, regular textures, however within in hospital stay likely only advancing to dys 3, considering slowed mastication/oral clearance, ability to self feed and suggesting reduced PO intake. Pt expressed wants given two choices, pt restated choices and provided answer in a timely manner ( eg. Two  creams or one. Two creams.)  Pt was left in room with call bell within reach and bed alarm set. ST recommends to continue skilled ST services.   2# Skilled ST services focused on swallow and language skills. SLP facilitated dys 3 textured trial snack, pt demonstrated oral clearance and no overt, however appeared fatigued (sighing in between swallows.) SLP continued assessment of progress towards baseline cognitive linguistic function, pt able to preform 1 step directions and name common items at baseline. SLP facilitated following 1 step body movement directions, pt demonstrated ability to preform 7 out 10 opportunities with delayed processing presented in increased time on incorrect movements. SLP facilitated naming common objects (from [place]) pt demonstrated ability to name 10 out 10 items, however required 40% sentence completion cues. SLP recommends continuing skills services focusing on swallow and cognitive linguistic skills. Pt was left in room with call bell within reach and bed alarm set. ST recommends to continue skilled ST services.      Pain Pain Assessment Pain Scale: 0-10 Pain Score: 0-No pain  Therapy/Group: Individual Therapy  [name]  [facility] 12/18/2018, 9:48 AM

## 2018-12-18 NOTE — Progress Notes (Signed)
Occupational Therapy Session Note  Patient Details  Name: LADELLE TEODORO MRN: [id number] Date of Birth: 08-15-1922  Today's Date: 12/18/2018 OT Individual Time: 1100-1130 OT Individual Time Calculation (min): 30 min    Short Term Goals: Week 2:  OT Short Term Goal 1 (Week 2): Pt will complete toilet transfers stand pivot with or without RW and mod assist. OT Short Term Goal 2 (Week 2): Pt will complete UB dressing with min assist in supported sitting. OT Short Term Goal 3 (Week 2): Pt will complete LB bathing with min assist sit to stand.  OT Short Term Goal 4 (Week 2): Pt will complete LB dressing with min assist sit to stand.    Skilled Therapeutic Interventions/Progress Updates:    Session 1:  (1100-1130)  Pt worked on sit to stand from the toilet to start session as she had been assisted with NT and PT when this therapist entered the room.  She needed increased time and overall mod assist to complete toileting secondary to posterior lean.  Decreased hip extension noted in standing as well as increased trunk flexion.  Max assist for stand pivot to the wheelchair from the toilet secondary to this position and pt not being able to efficiently take steps.  Worked on sit to stand and standing balance from wheelchair with use of the RW for support for rest of session.  Pt with increased posterior bias and LOB in standing secondary to increased fear of forward translation with hip extension.  When attempting to facilitate hip extension, pt steps forward secondary to increased fear.  Will continue to work on this.  Finished session with pt in the wheelchair with call button and phone in reach and safety alarm belt in place.    Session 2: [phone])  Pt worked on self feeding to begin session using her dominant RUE for majority of feeding.  After completion, she was able to then wash her hands at the sink with min instructional cueing.  Next, took pt down to the therapy gym where she transferred  stand pivot to the mat with mod assist.  Pt then utilized the [facility] for multiple sit to stand transitions and for standing balance with overall min assist.  Transitioned to use of the RW for sit to stand with mod assist and functional mobility.  Pt with increased motor planning deficits when attempting to advance the RLE.  Mod assist for functional mobility approximately 25-30'.  Finished session with return to the room with pt left in the wheelchair with call button and phone in reach and safety belt in place.    Therapy Documentation Precautions:  Precautions Precautions: Fall Restrictions Weight Bearing Restrictions: No  Pain: Pain Assessment Pain Scale: Faces Pain Score: 0-No pain ADL: See Care Tool Section for some details of ADL  Therapy/Group: Individual Therapy  Petr Bontempo OTR/L 12/18/2018, 12:14 PM

## 2018-12-18 NOTE — Progress Notes (Signed)
Physical Therapy Session Note  Patient Details  Name: [name] MRN: [id number] Date of Birth: 10/07/1921  Today's Date: 12/18/2018 PT Individual Time: 4496-7591 PT Individual Time Calculation (min): 58 min   Short Term Goals: Week 2:  PT Short Term Goal 1 (Week 2): Pt will ambulate at least 94ft using LRAD with no more than mod A PT Short Term Goal 2 (Week 2): Pt will consistently perform sit<>stand transfers using LRAD with no more than mod A PT Short Term Goal 3 (Week 2): Pt will stand statically using LRAD for 2 minutes with no more than min A  Skilled Therapeutic Interventions/Progress Updates:  Pt received sitting in w/c and agreeable to therapy session. No reports of pain during therapy session. Pt transported to gym in w/c for time management and energy conservation. Pt performed sit to stand transfer using RW with mod A and increased time and cuing for initiation of task. Pt ambulated ~14ft using RW with min/mod A for balance demonstrating decreased L LE weightshift in stance phase resulting in decreased R LE step length to step-to pattern - manual facilitation and cuing for L weight shift and increased R LE step length. Pt performed repeated sit<>stands from raised mat table progressing from using RW x 5 reps to no AD x4 reps, all with min A for lifting and balance - pt continues to require increased time to initiate task, especially with increased fatigue. Performed seated balance task from high perched position to increase weightbearing through B LEs with seated L trunk weightshift to touch wall - visual feedback with tactile cues provided to increase L lateral weight shift. Progressed to standing left lateral weight shift with tactile feedback of leaning towards the wall and visual feedback via mirror - progressed to reaching to grasp high placed object then to lifting R LE to external target with manual facilitation for L weight shift and for lifting R LE. Performed sit<>stand  using the stedy with min A for lifting/balance and pt transported to room for energy conservation. Pt noted to be incontinent of urine and performed sit<>stand in stedy with min A and total assist for LB management and peri-care with min A/CGA for standing balance during. Pt performed high perched sitting balance tasks in stedy without UE support focusing on L lateral trunk lean to reach external object. Pt left sitting in recliner with needs in reach and seat belt alarm on. Pt re-educated the need to use call bell to notify nursing staff of needs.    Therapy Documentation Precautions:  Precautions Precautions: Fall Restrictions Weight Bearing Restrictions: No    Therapy/Group: Individual Therapy  Netta Corrigan, PT, DPT 12/18/2018, 4:28 PM

## 2018-12-18 NOTE — Progress Notes (Signed)
[place] PHYSICAL MEDICINE & REHABILITATION PROGRESS NOTE   Subjective/Complaints:  Discussed with SLP diet upgraded to D2, unclear whether pt had a soft diet PTA    ROS: limited due to language/communication    Objective:   No results found. No results for input(s): WBC, HGB, HCT, PLT in the last 72 hours. No results for input(s): NA, K, CL, CO2, GLUCOSE, BUN, CREATININE, CALCIUM in the last 72 hours.  Intake/Output Summary (Last 24 hours) at 12/18/2018 0848 Last data filed at 12/17/2018 1700 Gross per 24 hour  Intake 720 ml  Output -  Net 720 ml     Physical Exam: Vital Signs Blood pressure (!) 125/49, pulse 60, temperature 98.2 F (36.8 C), temperature source Oral, resp. rate 17, height 5\' 5"  (1.651 m), weight 65.2 kg, SpO2 94 %. Constitutional: No distress . Vital signs reviewed. HEENT: EOMI, oral membranes moist Neck: supple Cardiovascular: RRR without murmur. No JVD    Respiratory: CTA Bilaterally without wheezes or rales. Normal effort    GI: BS +, non-tender, non-distended  Neurological: She isalert. Expressive greater than receptive aphasia persists. Followed most simple commands although usually with delay.  Apraxic. RUE 4/5 and 3- RLE  LUE and LLE 4 to 4+/5. Psych:drowsy but awakens to physical stim from sleep    Assessment/Plan: 1. Functional deficits secondary to left frontal infarct which require 3+ hours per day of interdisciplinary therapy in a comprehensive inpatient rehab setting.  Physiatrist is providing close team supervision and 24 hour management of active medical problems listed below.  Physiatrist and rehab team continue to assess barriers to discharge/monitor patient progress toward functional and medical goals  Care Tool:  Bathing  Bathing activity did not occur: Safety/medical concerns Body parts bathed by patient: Right arm, Left arm, Chest, Abdomen, Face, Right upper leg, Left upper leg, Right lower leg, Left lower leg   Body parts  bathed by helper: Front perineal area, Buttocks     Bathing assist Assist Level: Moderate Assistance - Patient 50 - 74%     Upper Body Dressing/Undressing Upper body dressing   What is the patient wearing?: Bra, Pull over shirt    Upper body assist Assist Level: Minimal Assistance - Patient > 75%    Lower Body Dressing/Undressing Lower body dressing      What is the patient wearing?: Incontinence brief, Pants     Lower body assist Assist for lower body dressing: Maximal Assistance - Patient 25 - 49%     Toileting Toileting    Toileting assist Assist for toileting: 2 Helpers     Transfers Chair/bed transfer  Transfers assist     Chair/bed transfer assist level: Maximal Assistance - Patient 25 - 49%(stand pivot using RW)     Locomotion Ambulation   Ambulation assist   Ambulation activity did not occur: Safety/medical concerns  Assist level: Moderate Assistance - Patient 50 - 74% Assistive device: Walker-rolling Max distance: 41ft   Walk 10 feet activity   Assist  Walk 10 feet activity did not occur: Safety/medical concerns  Assist level: Moderate Assistance - Patient - 50 - 74% Assistive device: Walker-rolling   Walk 50 feet activity   Assist Walk 50 feet with 2 turns activity did not occur: Safety/medical concerns         Walk 150 feet activity   Assist Walk 150 feet activity did not occur: Safety/medical concerns         Walk 10 feet on uneven surface  activity   Assist Walk 10  feet on uneven surfaces activity did not occur: Safety/medical concerns         Wheelchair     Assist Will patient use wheelchair at discharge?: Yes Type of Wheelchair: Manual Wheelchair activity did not occur: Safety/medical concerns  Wheelchair assist level: Minimal Assistance - Patient > 75% Max wheelchair distance: 169ft    Wheelchair 50 feet with 2 turns activity    Assist    Wheelchair 50 feet with 2 turns activity did not occur:  Safety/medical concerns   Assist Level: Minimal Assistance - Patient > 75%, Moderate Assistance - Patient 50 - 74%(mod A for turning)   Wheelchair 150 feet activity     Assist Wheelchair 150 feet activity did not occur: Safety/medical concerns   Assist Level: Minimal Assistance - Patient > 75%    Medical Problem List and Plan: 1.Functional declinesecondary toLeft frontal/ACA infarct.             --Continue CIR therapies including PT, OT, and SLP, aphasia at baseline per SLP , .2. Antithrombotics: -DVT/anticoagulation:Pharmaceutical:Lovenox -antiplatelet therapy: Low dose ASA 3. Pain Management:Tylenol prn 4. Mood:LCSW to follow for evaluation and support. -antipsychotic agents: N/A 5. Neuropsych: This patientis not fullycapable of making decisions on onown behalf. 6. Skin/Wound Care:routine pressure relief measures. 7. Fluids/Electrolytes/Nutrition:encourage PO  -D1 thin liquids currently   -sl hypokalemia--supplementing KCL 67meq daily   8. HTN: Monitor BP bid. Continue Amlodipine and Cozaar daily  -mild elevation this am no change to meds at this time Vitals:   12/17/18 1410 12/17/18 1942  BP: (!) 109/46 (!) 125/49  Pulse: 60 60  Resp: 18 17  Temp:  98.2 F (36.8 C)  SpO2: 97% 94%  HTN controlled 3/24 9. Dyslipidemia: On pravastatin.  10. 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   11. Mild hypo K supplement orally 12.  Dysphagia post stroke tolerating D2 possible upgrade to D3 per SLP LOS: 11 days A FACE TO FACE EVALUATION WAS PERFORMED  Charlett Blake 12/18/2018, 8:48 AM

## 2018-12-18 NOTE — Progress Notes (Signed)
Physical Therapy Session Note  Patient Details  Name: [name] MRN: [id number] Date of Birth: Dec 20, 1921  Today's Date: 12/18/2018 PT Individual Time: 1100-1130  PT Individual Time Calculation (min): 30 min  Short Term Goals: Week 2:  PT Short Term Goal 1 (Week 2): Pt will ambulate at least 68ft using LRAD with no more than mod A PT Short Term Goal 2 (Week 2): Pt will consistently perform sit<>stand transfers using LRAD with no more than mod A PT Short Term Goal 3 (Week 2): Pt will stand statically using LRAD for 2 minutes with no more than min A  Skilled Therapeutic Interventions/Progress Updates:  Pt received asleep supine in bed and agreeable to therapy session. No reports of pain during therapy session. Therapist asked about need for bathroom time and pt verbalizing "yes." Pt performed supine to sit with min A for trunk control with increased time and effort due to impaired initiation, motor planning, and decreased strength. Performed sit to stand using RW from elevated EOB with mod/max A of 1 with increased cuing and time for pt to initiation task. Performed stand pivot transfer from bed to w/c using RW with mod A for balance and cuing for AD management and sequencing. Pt transported to bathroom in w/c. Performed stand pivot transfer from w/c to toilet using RW transitioning to grabbars, with mod/max A for lifting to stand and min/mod A for standing balance during therapist total assist to doff brief. Pt continent of bowel and bladder on toilet with nursing staff notified and present for total assist with peri-hygiene. Pt performed sit to stand from toilet to grabbars with max A of 1-2 due to pt demonstrating increased difficulty initiating task and motor planning transfer from toilet. Occupational therapist arriving and pt left sitting on toilet with needs in reach and OT present to continue working with patient.   Therapy Documentation Precautions:  Precautions Precautions:  Fall Restrictions Weight Bearing Restrictions: No  Therapy/Group: Individual Therapy  Tawana Scale, PT, DPT  12/18/2018, 10:14 AM

## 2018-12-19 ENCOUNTER — Inpatient Hospital Stay (HOSPITAL_COMMUNITY): Payer: Medicare Other | Admitting: Speech Pathology

## 2018-12-19 ENCOUNTER — Inpatient Hospital Stay (HOSPITAL_COMMUNITY): Payer: Medicare Other

## 2018-12-19 ENCOUNTER — Inpatient Hospital Stay (HOSPITAL_COMMUNITY): Payer: Self-pay

## 2018-12-19 ENCOUNTER — Inpatient Hospital Stay (HOSPITAL_COMMUNITY): Payer: Medicare Other | Admitting: Occupational Therapy

## 2018-12-19 LAB — CBC
HCT: 38 % (ref 36.0–46.0)
Hemoglobin: 13.1 g/dL (ref 12.0–15.0)
MCH: 32 pg (ref 26.0–34.0)
MCHC: 34.5 g/dL (ref 30.0–36.0)
MCV: 92.9 fL (ref 80.0–100.0)
Platelets: 419 10*3/uL — ABNORMAL HIGH (ref 150–400)
RBC: 4.09 MIL/uL (ref 3.87–5.11)
RDW: 12.5 % (ref 11.5–15.5)
WBC: 5.1 10*3/uL (ref 4.0–10.5)
nRBC: 0 % (ref 0.0–0.2)

## 2018-12-19 LAB — BASIC METABOLIC PANEL
ANION GAP: 13 (ref 5–15)
BUN: 21 mg/dL (ref 8–23)
CO2: 20 mmol/L — ABNORMAL LOW (ref 22–32)
Calcium: 8.5 mg/dL — ABNORMAL LOW (ref 8.9–10.3)
Chloride: 105 mmol/L (ref 98–111)
Creatinine, Ser: 0.84 mg/dL (ref 0.44–1.00)
GFR calc Af Amer: >60 mL/min (ref >60)
GFR calc non Af Amer: 59 mL/min — ABNORMAL LOW (ref >60)
Glucose, Bld: 96 mg/dL (ref 70–99)
Potassium: 4 mmol/L (ref 3.5–5.1)
Sodium: 138 mmol/L (ref 135–145)

## 2018-12-19 NOTE — Progress Notes (Signed)
Speech Language Pathology Daily Session Note  Patient Details  Name: [name] MRN: [id number] Date of Birth: 1922/03/25  Today's Date: 12/19/2018 SLP Individual Time: 5625-6389 SLP Individual Time Calculation (min): 55 min  Short Term Goals: Week 2: SLP Short Term Goal 1 (Week 2): Given verbal choice of 2 items, pt will verbalize choice in timely manner with Min A cues in 9 out of 10 opportunities.  SLP Short Term Goal 2 (Week 2): Given verbal choice of 2 items, pt will produce correct word intelligibility in 8 out of 10 opportunities with Min A cues.  SLP Short Term Goal 3 (Week 2): Given Mod A cues, pt will follow 1 step directions in 8 out of 10 opportunties.  SLP Short Term Goal 4 (Week 2): Given Min A cues, pt will name common familiar items accuracy in 8 out of 10 opportunities.  SLP Short Term Goal 5 (Week 2): Pt will cosnume trials of dysphagia 2 with minimal overt s/s of aspiration or dyphagia with Min A cues over 3 sessions to demonstrate readiness for diet upgrade.   Skilled Therapeutic Interventions:  Pt was seen for skilled ST targeting goals for communication and dysphagia.  Pt consumed dys 2 textures and thin liquids on her breakfast tray with mild, intermittent wet vocal quality but otherwise no overt s/s of aspiration.  Oral phase was timely and efficient with solids with no residuals noted in the oral cavity post swallow.  Pt was able to communicate that her coffee was "too cold" at first and then "too hot" once therapist heated up coffee in the microwave independently.  When coffee finally reached the right temperature pt stated that it was "perfect" independently as well.  Pt reports that she typically drinks 2 cups of coffee in the morning and either tea or lemonade at lunch time with min question cues.  Pt was able to state today's date with min-mod assist verbal and visual cues while looking at the calendar in her room.  The calendar contained pictures of dogs  (specifically pomeranians) and pt was able to express her love of dogs in short, simple phrases with min assist.  Pt was left in wheelchair with chair alarm set and call bell within reach.  Continue per current plan of care.    Pain Pain Assessment Pain Scale: 0-10 Pain Score: 0-No pain Faces Pain Scale: No hurt  Therapy/Group: Individual Therapy  Emmalene Kattner, Selinda Orion 12/19/2018, 12:08 PM

## 2018-12-19 NOTE — Progress Notes (Signed)
[place] PHYSICAL MEDICINE & REHABILITATION PROGRESS NOTE   Subjective/Complaints:  Aphasic but at baseline per SLP No problems overnite per RN.  Pt remains incont but has not been tried on timed toileting   ROS: limited due to language/communication    Objective:   No results found. Recent Labs    12/19/18 0536  WBC 5.1  HGB 13.1  HCT 38.0  PLT 419*   Recent Labs    12/19/18 0536  NA 138  K 4.0  CL 105  CO2 20*  GLUCOSE 96  BUN 21  CREATININE 0.84  CALCIUM 8.5*    Intake/Output Summary (Last 24 hours) at 12/19/2018 0848 Last data filed at 12/18/2018 2124 Gross per 24 hour  Intake 780 ml  Output -  Net 780 ml     Physical Exam: Vital Signs Blood pressure (!) 122/53, pulse 85, temperature 97.9 F (36.6 C), temperature source Oral, resp. rate 18, height 5\' 5"  (1.651 m), weight 65.2 kg, SpO2 98 %. Constitutional: No distress . Vital signs reviewed. HEENT: EOMI, oral membranes moist Neck: supple Cardiovascular: RRR without murmur. No JVD    Respiratory: CTA Bilaterally without wheezes or rales. Normal effort    GI: BS +, non-tender, non-distended  Neurological: She isalert. Expressive greater than receptive aphasia persists. Followed most simple commands although usually with delay.  Apraxic. RUE 4/5 and 3- RLE  LUE and LLE 4 to 4+/5. Psych:drowsy but awakens to physical stim from sleep    Assessment/Plan: 1. Functional deficits secondary to left frontal infarct which require 3+ hours per day of interdisciplinary therapy in a comprehensive inpatient rehab setting.  Physiatrist is providing close team supervision and 24 hour management of active medical problems listed below.  Physiatrist and rehab team continue to assess barriers to discharge/monitor patient progress toward functional and medical goals  Care Tool:  Bathing  Bathing activity did not occur: Safety/medical concerns Body parts bathed by patient: Right arm, Left arm, Chest, Abdomen,  Face, Right upper leg, Left upper leg, Right lower leg, Left lower leg   Body parts bathed by helper: Front perineal area, Buttocks     Bathing assist Assist Level: Moderate Assistance - Patient 50 - 74%     Upper Body Dressing/Undressing Upper body dressing   What is the patient wearing?: Bra, Pull over shirt    Upper body assist Assist Level: Minimal Assistance - Patient > 75%    Lower Body Dressing/Undressing Lower body dressing      What is the patient wearing?: Incontinence brief, Pants     Lower body assist Assist for lower body dressing: Maximal Assistance - Patient 25 - 49%     Toileting Toileting    Toileting assist Assist for toileting: 2 Helpers     Transfers Chair/bed transfer  Transfers assist     Chair/bed transfer assist level: Maximal Assistance - Patient 25 - 49%(stand pivot using RW)     Locomotion Ambulation   Ambulation assist   Ambulation activity did not occur: Safety/medical concerns  Assist level: Moderate Assistance - Patient 50 - 74% Assistive device: Walker-rolling Max distance: 19ft   Walk 10 feet activity   Assist  Walk 10 feet activity did not occur: Safety/medical concerns  Assist level: Moderate Assistance - Patient - 50 - 74% Assistive device: Walker-rolling   Walk 50 feet activity   Assist Walk 50 feet with 2 turns activity did not occur: Safety/medical concerns         Walk 150 feet activity  Assist Walk 150 feet activity did not occur: Safety/medical concerns         Walk 10 feet on uneven surface  activity   Assist Walk 10 feet on uneven surfaces activity did not occur: Safety/medical concerns         Wheelchair     Assist Will patient use wheelchair at discharge?: Yes Type of Wheelchair: Manual Wheelchair activity did not occur: Safety/medical concerns  Wheelchair assist level: Minimal Assistance - Patient > 75% Max wheelchair distance: 120ft    Wheelchair 50 feet with 2 turns  activity    Assist    Wheelchair 50 feet with 2 turns activity did not occur: Safety/medical concerns   Assist Level: Minimal Assistance - Patient > 75%, Moderate Assistance - Patient 50 - 74%(mod A for turning)   Wheelchair 150 feet activity     Assist Wheelchair 150 feet activity did not occur: Safety/medical concerns   Assist Level: Minimal Assistance - Patient > 75%    Medical Problem List and Plan: 1.Functional declinesecondary toLeft frontal/ACA infarct.             --Continue CIR therapies including PT, OT, and SLP, aphasia at baseline per SLP , .2. Antithrombotics: -DVT/anticoagulation:Pharmaceutical:Lovenox -antiplatelet therapy: Low dose ASA 3. Pain Management:Tylenol prn 4. Mood:LCSW to follow for evaluation and support. -antipsychotic agents: N/A 5. Neuropsych: This patientis not fullycapable of making decisions on onown behalf. 6. Skin/Wound Care:routine pressure relief measures. 7. Fluids/Electrolytes/Nutrition:encourage PO  -D2 thin liquids currently - po fluids 740ml yesterday  -sl hypokalemia--supplementing KCL 95meq daily   8. HTN: Monitor BP bid. Continue Amlodipine and Cozaar daily  -mild elevation this am no change to meds at this time Vitals:   12/18/18 1930 12/19/18 0534  BP: (!) 130/53 (!) 122/53  Pulse: (!) 59 85  Resp: 16 18  Temp: 97.9 F (36.6 C) 97.9 F (36.6 C)  SpO2: 97% 98%  HTN controlled 3/25 9. Dyslipidemia: On pravastatin.  10. 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   11. Mild hypo K supplement orally 12.  Dysphagia post stroke tolerating D2 thin liquids  possible upgrade to D3 per SLP- discuss in conference today LOS: 12 days A FACE TO FACE EVALUATION WAS PERFORMED  Charlett Blake 12/19/2018, 8:48 AM

## 2018-12-19 NOTE — Progress Notes (Signed)
Physical Therapy Session Note  Patient Details  Name: [name] MRN: [id number] Date of Birth: 01/28/1922  Today's Date: 12/19/2018 PT Individual Time: 0800-0830 and 1330- 1438 PT Individual Time Calculation (min): 30 min and 68 minutes  Short Term Goals: Week 2:  PT Short Term Goal 1 (Week 2): Pt will ambulate at least 2ft using LRAD with no more than mod A PT Short Term Goal 2 (Week 2): Pt will consistently perform sit<>stand transfers using LRAD with no more than mod A PT Short Term Goal 3 (Week 2): Pt will stand statically using LRAD for 2 minutes with no more than min A  Skilled Therapeutic Interventions/Progress Updates:   Session 1:  Pt supine in bed upon PT arrival, agreeable to therapy tx and denies pain. Pt transferred to sitting EOB with use of bed rails and increased time, min assist with cues for hand placement. Pt performed sit<>stand this session from elevated EOB within the stedy, min assist and given increased time. Pt transferred to the toilet using stedy. Pt able to maintain standing balance within stedy with min assist while therapist doffed soiled brief. Pt incontinent and continent of bladder, continent of bowel this session. Pt maintained standing balance within stedy with single UE support while using other UE to perform pericare with set up assist, mod assist for lower body clothing management. Pt transferred to sink with stedy. Pt standing at the sink within stedy maintained standing balance x 4 minutes with min assist while brushing teeth. Pt transferred to recliner chair with stedy, left seated with needs in reach and chair alarm set.   Session 2: Pt seated in w/c upon PT arrival, agreeable to therapy tx and denies pain. Pt transported to the gym in w/c. Pt performed stand pivot to the mat with UE support on therapists shoulders, mod assist. Pt performed sit<>stand from elevated mat with RW and min assist. While standing with RW and mirror for visual feedback  pt worked on reaching activity to facilitate L lateral weightshift and limit R lateral lean/postreior lean. Pt performed x 5 sit<>stands min-mod assist throughout this session from elevated mat (24in) without AD and with mirror for visual feedback, therapist providing manual facilitation for hip and trunk extension, verbal cues for upright posture. Pt worked on lateral weightshifting in standing to "dance" and shift hips side to side, min assist for standing balance and shifting. Standing without AD pt worked on upright posture and lateral weightshifting while performing horseshoe reaching task, x 2 trials. Pt worked on pre-gait and standing balance to perform stepping to target in place with R LE x4 steps and then x5 steps, mod assist with pt's L arm over therapist's shoulder to encourage L lateral weightshift with R stepping, manual facilitation for L lateral weightshift. Pt performed stand pivot back to w/c with mod assist, cues for techniques and hand placement. Pt transported back to room and performed stand pivot to recliner with mod assist. Pt left in recliner with needs in reach and chair alarm set.   Therapy Documentation Precautions:  Precautions Precautions: Fall Restrictions Weight Bearing Restrictions: No    Therapy/Group: Individual Therapy  Netta Corrigan, PT, DPT 12/19/2018, 7:45 AM

## 2018-12-19 NOTE — Progress Notes (Signed)
Occupational Therapy Session Note  Patient Details  Name: [name] MRN: [id number] Date of Birth: 1921-12-30  Today's Date: 12/19/2018 OT Individual Time: 0903-1000 OT Individual Time Calculation (min): 57 min    Short Term Goals: Week 2:  OT Short Term Goal 1 (Week 2): Pt will complete toilet transfers stand pivot with or without RW and mod assist. OT Short Term Goal 2 (Week 2): Pt will complete UB dressing with min assist in supported sitting. OT Short Term Goal 3 (Week 2): Pt will complete LB bathing with min assist sit to stand.  OT Short Term Goal 4 (Week 2): Pt will complete LB dressing with min assist sit to stand.    Skilled Therapeutic Interventions/Progress Updates:    Pt completed shower and dressing during session.  [name] was utilized for transfer into the shower with pt completing sit to stand on the [place] with mod assist.  Once in the shower she needed max assist for undressing.  Bathing completed sit to stand with mod assist overall with use of a grab bar for support and max demonstrational cueing to sequence.  After bathing she completed short distance mobility to the toilet with max assist and use of the RW.   She still demonstrates motor planning impairment with mombility, exhibiting decreased ability to advance the RLE efficiently.  Max assist for toileting with transfer back out to the wheelchair for dressing with use of the [place] again.  She completed UB dressing with mod assist and LB dressing sit to stand with max assist as well.  Finished session with pt in the wheelchair with call button and phone in reach.    Therapy Documentation Precautions:  Precautions Precautions: Fall Restrictions Weight Bearing Restrictions: No  Pain: Pain Assessment Pain Scale: 0-10 Pain Score: 0-No pain Faces Pain Scale: No hurt ADL: See Care Tool For some details of ADL  Therapy/Group: Individual Therapy  [name] OTR/L 12/19/2018, 11:12 AM

## 2018-12-20 ENCOUNTER — Inpatient Hospital Stay (HOSPITAL_COMMUNITY): Payer: Medicare Other | Admitting: Occupational Therapy

## 2018-12-20 ENCOUNTER — Inpatient Hospital Stay (HOSPITAL_COMMUNITY): Payer: Medicare Other | Admitting: Speech Pathology

## 2018-12-20 ENCOUNTER — Inpatient Hospital Stay (HOSPITAL_COMMUNITY): Payer: Medicare Other | Admitting: Physical Therapy

## 2018-12-20 NOTE — Progress Notes (Signed)
Physical Therapy Session Note  Patient Details  Name: [name] MRN: [id number] Date of Birth: 1922-04-12  Today's Date: 12/20/2018 PT Individual Time: 1100-1156 and 5916-3846 PT Individual Time Calculation (min): 56 min and 55 minutes  Short Term Goals: Week 2:  PT Short Term Goal 1 (Week 2): Pt will ambulate at least 84ft using LRAD with no more than mod A PT Short Term Goal 2 (Week 2): Pt will consistently perform sit<>stand transfers using LRAD with no more than mod A PT Short Term Goal 3 (Week 2): Pt will stand statically using LRAD for 2 minutes with no more than min A  Skilled Therapeutic Interventions/Progress Updates:    Session 1: Pt received sitting in w/c and agreeable to therapy session. Patient verbalizes no pain during therapy session. Therapist recommending toileting and pt agreeable. Pt performed multiple sit<>stands using stedy with BUE support and min to mod assist for lifting. Pt transported to/from bathroom in stedy with close supervision for maintaining balance while moving. Pt continent of bowel and bladder and required total assist for peri-care and LB dressing with min A to maintain balance in stedy. Pt transported to/from therapy gym in w/c for time management and energy conservation. Pt performed stand pivot transfer w/c to mad table using RW with mod assist for lifting and balance. Pt performed multiple sit<>stands from EOM requiring mod assist to stand without AD and min assist to stand using RW. Performed neuromuscular re-education focusing on standing L lateral weightshift and stepping forward/backwards with R LE to external target progressed to 2" step  - visual feedback provided and manual facilitation for L weight shift to wall - pt with increased difficulty performing task without AD requiring significant increased time to initiate task with pt demonstrating increased success and increased R LE step length with RW provided for B UE support - min to mod assist  throughout for balance. Pt ambulated ~66ft using RW with mod A for balance and manual facilitation for L weight shift during stance phase with tactile and verbal cuing for improved R LE step length with pt demonstrating improvement compared to prior therapy session. Pt returned to room and left sitting in w/c with needs in reach, seat belt alarm on, and reinforcement of education on using call bell with pt demonstrating understanding.  Session 2: Pt received in w/c and agreeable to therapy session. Therapist suggested toileting with pt agreeable. Pt ambulated to/from bathroom ~59ft x2 using RW with mod assist for balance and cuing for improved AD management, upright posture and manual facilitation for increased L LE weight shift for increased R LE step length. Pt continent of bowel and bladder with total assist for peri-care and LB dressing while pt standing with B UE support on RW and min assist for balance. Pt performed stand pivot transfer from w/c to NuStep using RW with mod assist for lifting and balance with pt demonstrating increased difficulty turning during transfers due to pt initiating sitting prior to fully turning. Pt performed NuStep at level 4 for 6 minutes achieving 240steps focusing on increased activity tolerance and reciprocal LE muscular activation pattern with cuing for proper technique. Patient reporting increased fatigue at end of session and transported in w/c to room. Pt left seated in w/c with needs in reach and seat belt alarm on.  Therapy Documentation Precautions:  Precautions Precautions: Fall Restrictions Weight Bearing Restrictions: No  Pain:   During 2nd session pt reports sudden onset R LE posterior calf pain while ambulating back from bathroom  and then sudden onset R posterolateral neck pain - RN notified and heat applied to neck for ~10 minutes during therapy session with skin intact upon removal of heat.    Therapy/Group: Individual Therapy  Tawana Scale, PT,  DPT 12/20/2018, 7:49 AM

## 2018-12-20 NOTE — Progress Notes (Signed)
[facility] PHYSICAL MEDICINE & REHABILITATION PROGRESS NOTE   Subjective/Complaints:  3/25 labs reviewed, no significant abnormalities SLP at bedside trial of D3 without cough, handling thin liq well  Discussed aphasia which is likely at baseline  ROS: limited due to language/communication    Objective:   No results found. Recent Labs    12/19/18 0536  WBC 5.1  HGB 13.1  HCT 38.0  PLT 419*   Recent Labs    12/19/18 0536  NA 138  K 4.0  CL 105  CO2 20*  GLUCOSE 96  BUN 21  CREATININE 0.84  CALCIUM 8.5*    Intake/Output Summary (Last 24 hours) at 12/20/2018 0752 Last data filed at 12/19/2018 2025 Gross per 24 hour  Intake 420 ml  Output -  Net 420 ml     Physical Exam: Vital Signs Blood pressure (!) 143/49, pulse 60, temperature (!) 97.5 F (36.4 C), temperature source Oral, resp. rate 16, height 5\' 5"  (1.651 m), weight 65.2 kg, SpO2 96 %. Constitutional: No distress . Vital signs reviewed. HEENT: EOMI, oral membranes moist Neck: supple Cardiovascular: RRR without murmur. No JVD    Respiratory: CTA Bilaterally without wheezes or rales. Normal effort    GI: BS +, non-tender, non-distended  Neurological: She isalert. Expressive greater than receptive aphasia persists. Followed most simple commands although usually with delay.  Apraxic. RUE 4/5 and 3- RLE  LUE and LLE 4 to 4+/5. Psych:drowsy but awakens to physical stim from sleep    Assessment/Plan: 1. Functional deficits secondary to left frontal infarct which require 3+ hours per day of interdisciplinary therapy in a comprehensive inpatient rehab setting.  Physiatrist is providing close team supervision and 24 hour management of active medical problems listed below.  Physiatrist and rehab team continue to assess barriers to discharge/monitor patient progress toward functional and medical goals  Care Tool:  Bathing  Bathing activity did not occur: Safety/medical concerns Body parts bathed by  patient: Right arm, Left arm, Chest, Abdomen, Face, Right upper leg, Left upper leg, Right lower leg, Left lower leg   Body parts bathed by helper: Front perineal area, Buttocks     Bathing assist Assist Level: Moderate Assistance - Patient 50 - 74%     Upper Body Dressing/Undressing Upper body dressing   What is the patient wearing?: Bra, Pull over shirt    Upper body assist Assist Level: Moderate Assistance - Patient 50 - 74%    Lower Body Dressing/Undressing Lower body dressing      What is the patient wearing?: Incontinence brief, Pants     Lower body assist Assist for lower body dressing: Maximal Assistance - Patient 25 - 49%     Toileting Toileting    Toileting assist Assist for toileting: Maximal Assistance - Patient 25 - 49%     Transfers Chair/bed transfer  Transfers assist     Chair/bed transfer assist level: Dependent - mechanical lift     Locomotion Ambulation   Ambulation assist   Ambulation activity did not occur: Safety/medical concerns  Assist level: Moderate Assistance - Patient 50 - 74% Assistive device: Walker-rolling Max distance: 62ft   Walk 10 feet activity   Assist  Walk 10 feet activity did not occur: Safety/medical concerns  Assist level: Moderate Assistance - Patient - 50 - 74% Assistive device: Walker-rolling   Walk 50 feet activity   Assist Walk 50 feet with 2 turns activity did not occur: Safety/medical concerns         Walk 150 feet  activity   Assist Walk 150 feet activity did not occur: Safety/medical concerns         Walk 10 feet on uneven surface  activity   Assist Walk 10 feet on uneven surfaces activity did not occur: Safety/medical concerns         Wheelchair     Assist Will patient use wheelchair at discharge?: Yes Type of Wheelchair: Manual Wheelchair activity did not occur: Safety/medical concerns  Wheelchair assist level: Minimal Assistance - Patient > 75% Max wheelchair distance:  131ft    Wheelchair 50 feet with 2 turns activity    Assist    Wheelchair 50 feet with 2 turns activity did not occur: Safety/medical concerns   Assist Level: Minimal Assistance - Patient > 75%, Moderate Assistance - Patient 50 - 74%(mod A for turning)   Wheelchair 150 feet activity     Assist Wheelchair 150 feet activity did not occur: Safety/medical concerns   Assist Level: Minimal Assistance - Patient > 75%    Medical Problem List and Plan: 1.Functional declinesecondary toLeft frontal/ACA infarct.             --Continue CIR therapies, SNF search in progress , .2. Antithrombotics: -DVT/anticoagulation:Pharmaceutical:Lovenox -antiplatelet therapy: Low dose ASA 3. Pain Management:Tylenol prn 4. Mood:LCSW to follow for evaluation and support. -antipsychotic agents: N/A 5. Neuropsych: This patientis not fullycapable of making decisions on onown behalf. 6. Skin/Wound Care:routine pressure relief measures. 7. Fluids/Electrolytes/Nutrition:encourage PO  -D2 thin liquids currently - po fluids 476ml yesterday  -sl hypokalemia--supplementing KCL 15meq daily   8. HTN: Monitor BP bid. Continue Amlodipine and Cozaar daily  -mild elevation this am no change to meds at this time Vitals:   12/19/18 1921 12/20/18 0341  BP: 137/65 (!) 143/49  Pulse: 76 60  Resp: 14 16  Temp: 97.8 F (36.6 C) (!) 97.5 F (36.4 C)  SpO2: 95% 96%  HTN controlled 3/26 9. Dyslipidemia: On pravastatin.  10. 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   11. Mild hypo K supplement orally 12.  Dysphagia post stroke tolerating D2 thin liquids  possible upgrade to D3 per SLP- discuss in conference today LOS: 13 days A FACE TO [place] E Jesyka Slaght 12/20/2018, 7:52 AM

## 2018-12-20 NOTE — Progress Notes (Signed)
Occupational Therapy Session Note  Patient Details  Name: [name] MRN: [id number] Date of Birth: [birth year]  Today's Date: 12/20/2018 OT Individual Time: 0902-1005 OT Individual Time Calculation (min): 63 min    Short Term Goals: Week 2:  OT Short Term Goal 1 (Week 2): Pt will complete toilet transfers stand pivot with or without RW and mod assist. OT Short Term Goal 2 (Week 2): Pt will complete UB dressing with min assist in supported sitting. OT Short Term Goal 3 (Week 2): Pt will complete LB bathing with min assist sit to stand.  OT Short Term Goal 4 (Week 2): Pt will complete LB dressing with min assist sit to stand.    Skilled Therapeutic Interventions/Progress Updates:    Pt completed bathing and dressing sit to stand at the sink during session.  She needed max assist for transfer from supine to sit EOB as well as for transfer stand pivot to the wheelchair.  She needed supervision for UB bathing with mod assist for LB bathing sit to stand.  Max assist for donning bra and pullover shirt as well as for LB dressing.  Pt with bladder incontinence during standing.  Still with decreased motor initiation with sit to stand as well as decreased hip and trunk extension.  Finished session with pt in the wheelchair with call button and phone in place.  Chair alarm in place as well.    Therapy Documentation Precautions:  Precautions Precautions: Fall Restrictions Weight Bearing Restrictions: No  Pain: Pain Assessment Pain Scale: 0-10 Pain Score: 0-No pain ADL: See Care Tool Section for some details of ADL  Therapy/Group: Individual Therapy  Tannor Pyon OTR/L 12/20/2018, 12:09 PM

## 2018-12-20 NOTE — Progress Notes (Signed)
Social Work Patient ID: [name], female   DOB: 1922/04/16, 85 y.o.   MRN: [id number]   CSW met with pt and talked with her son via telephone on 12-19-18 following team conference to update them on discussion and d/c disposition.  Son feels pt may need a step between CIR and [facility] and would like for CSW to pursue [facility] SNF.  Pt nodded that she understood this was the recommendation of her son and that therapy team agrees.  CSW discussed facilities with son and will begin the process of looking for a bed.  Therapists have seen good progress with pt and would like to continue to work with her for a little longer as pt has become less fearful of falling and is trusting therapists more and thus is able to try more things.  CSW will continue to work with pt, son, team, and SNFs to make this transition as smooth as possible for the pt.

## 2018-12-20 NOTE — Patient Care Conference (Signed)
Inpatient RehabilitationTeam Conference and Plan of Care Update Date: 12/19/2018   Time: 11:00 AM    Patient Name: [name]      Medical Record Number: [id number]  Date of Birth: 21-Jun-1922 Sex: Female         Room/Bed: 4W11C/4W11C-01 Payor Info: Payor: MEDICARE / Plan: MEDICARE PART A AND B / Product Type: *No Product type* /    Admitting Diagnosis: cva  Admit Date/Time:  12/07/2018  5:48 PM Admission Comments: No comment available   Primary Diagnosis:  <principal problem not specified> Principal Problem: <principal problem not specified>  Patient Active Problem List   Diagnosis Date Noted  . Acute right ACA stroke ([place]) 12/07/2018  . Stroke (cerebrum) ([place]) 12/03/2018  . Cough 10/11/2018  . Abnormal breath sounds 10/11/2018  . Closed nondisplaced fracture of proximal phalanx of lesser toe of right foot 06/26/2018  . Right ankle pain 06/21/2018  . Right foot pain 06/21/2018  . Abnormal CXR 04/26/2017  . Lobar pneumonia ([place]) 01/20/2017  . Acute on chronic respiratory failure with hypoxia ([place]) 01/17/2017  . Acute respiratory failure with hypoxia ([place]) 01/17/2017  . DOE (dyspnea on exertion) 10/23/2014  . Hyponatremia 10/23/2014  . RLL pneumonia ([place]) 10/11/2013  . Other specified anemias 10/10/2013  . Acute respiratory failure ([place]) 08/15/2013  . Aspiration pneumonia ([place]) 08/15/2013  . Altered mental status 08/15/2013  . Incarcerated femoral hernia 08/14/2013  . Esophageal reflux 08/05/2013  . Unspecified constipation 07/03/2013  . Closed fracture of left hip ([place]) 07/01/2013  . 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 Atrial fibrillation ([place]) 10/30/2012  . Low back pain 10/26/2012  . Syncope 10/26/2012  . PNA (pneumonia) 09/26/2012  . UTI (lower urinary tract infection) 09/26/2012  . Hypokalemia 09/26/2012  . Leukocytosis 09/26/2012  . Cardiac enzymes elevated 07/25/2011  . Impaired glucose tolerance 06/03/2011  . Preventative health  care 06/03/2011  . IRON DEFICIENCY 12/06/2010  . 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 CVA (cerebral vascular accident) ([place]) 01/09/2009  . Aphasia 01/09/2009  . Urinary incontinence 01/09/2009  . PACEMAKER, PERMANENT 01/09/2009  . Dementia ([place]) 05/19/2008  . Hypothyroidism 11/20/2007  . Hyperlipidemia 11/20/2007  . Essential hypertension 11/20/2007  . DIVERTICULOSIS, COLON 11/20/2007  . FATIGUE 11/20/2007  . CEREBROVASCULAR ACCIDENT, HX OF 11/20/2007  . TIA 05/14/2003    Expected Discharge Date: Expected Discharge Date: (SNF)  Team Members Present: Physician leading conference: Dr. Alysia Penna Social Worker Present: Alfonse Alpers, LCSW Nurse Present: Benjie Karvonen, RN PT Present: Drema Dallas Shagen, PT;Other (comment)(Carly Pippin, PT) OT Present: Clyda Greener, OT SLP Present: Charolett Bumpers, SLP PPS Coordinator present : Gunnar Fusi     Current Status/Progress Goal Weekly Team Focus  Medical   responds to timed toileting, not at baseline for ambulation , close with ADL, BPs   improve continenece and safety  Start SNF search   Bowel/Bladder   Incontinent of bowel and bladder LBM-12/18/18  Regain control of bowel and bladder  timed toileting when awake   Swallow/Nutrition/ Hydration   dys 2 and thin Min A  Min A with least restrictive diet  dys 2 tolerance and dys 3 trials - likely highest advancement due to fatigue   ADL's   Supervision for UB bathing with min assist for UB dressing.  Mod assist for LB bathing with mod assist overall  for LB dressing. Mod assist overall for stand pivot transfers.  Still with posterior lean and slight lean/pushing to the right.      min  assist overall  selfcare retraining, balance retraining, transfer training, neuromuscular re-education, pt/family education   Mobility   min/mod A bed mobility, mod/max A sit<>stand using RW, min A sit<>stand from high surfaces, min/mod A ambulating ~21ft using RW  supervision-min assist  transfers, sitting  balance, standing balance, gait, midline orienation, activity tolerance   Communication   Min A naming common items, and expressing wants/need with two options  Sup A, naming common items and expressing wants/needs with two options   namming common items, expressing wants/needs with two options    Safety/Cognition/ Behavioral Observations  Min A, 1 step directions   Sup A 1 step directions   functional 1 step directions    Pain   Denies pain  pain level <2/10  assess and treat prn   Skin   MASD to gluteal folds and perineum-skin barrier applied  No other skin issues  Assess skin every shift and prn    Rehab Goals Patient on target to meet rehab goals: Yes Rehab Goals Revised: none *See Care Plan and progress notes for long and short-term goals.     Barriers to Discharge  Current Status/Progress Possible Resolutions Date Resolved   Physician    Medical stability;Decreased caregiver support     progressing with therapy  Rehab until SNF placement      Nursing                  PT                    OT                  SLP                SW                Discharge Planning/Teaching Needs:  Pt's therapy team feels pt may need SNF care prior to returning to [place] at [facility].  Family agrees pt will need more rehab before returning to independent living at [facility].  Defer to the next venue.   Team Discussion:  Pt is doing well medically and continues to have aphasia, but seems close to her baseline.  Pt doing well with timed toileting and has remained continent.  Pt is making progress with self care and is supervision for UB bathing and min/mod A for upper body dressing.  Lower body tasks require more mod/max A, but pt had assistance for bathing and dressing at home PTA.  Pt is making progress in PT, as well.  With more time, pt is min/mod A for sit to stand and mod to max A for ambulation with RW.  Pt's diet was upgraded to D2 with trials of D3, which will likely  be her goal level.  Pt is close to baseline for communication and cognition, requiring min A for 1 step directions.  Revisions to Treatment Plan:  none    Continued Need for Acute Rehabilitation Level of Care: The patient requires daily medical management by a physician with specialized training in physical medicine and rehabilitation for the following conditions: Daily direction of a multidisciplinary physical rehabilitation program to ensure safe treatment while eliciting the highest outcome that is of practical value to the patient.: Yes Daily medical management of patient stability for increased activity during participation in an intensive rehabilitation regime.: Yes Daily analysis of laboratory values and/or radiology reports with any subsequent need for medication adjustment of medical intervention for : Neurological problems;Mood/behavior  problems   I attest that I was present, lead the team conference, and concur with the assessment and plan of the team.   Lorraina Spring, Silvestre Mesi 12/20/2018, 10:19 AM

## 2018-12-20 NOTE — NC FL2 (Addendum)
[place] MEDICAID FL2 LEVEL OF CARE SCREENING TOOL     IDENTIFICATION  Patient Name: [name] Birthdate: [birth year] Sex: female Admission Date (Current Location): 12/07/2018  [facility] and [place] Number:  Herbalist and Address:  [facility], [place] [address] [place], [place] [address]      Provider Number: [id number]  Attending Physician Name and Address:  Charlett Blake, MD  Relative Name and Phone Number:       Current Level of Care: Other (Comment)(Acute Inpatient Rehabilitation) Recommended Level of Care: [place] Prior Approval Number:    Date Approved/Denied:   PASRR Number:    Discharge Plan: SNF    Current Diagnoses: Patient Active Problem List   Diagnosis Date Noted  . Dysphagia, post-stroke   . Global aphasia   . Acute right ACA stroke ([place]) 12/07/2018  . Stroke (cerebrum) ([place]) 12/03/2018  . Cough 10/11/2018  . Abnormal breath sounds 10/11/2018  . Closed nondisplaced fracture of proximal phalanx of lesser toe of right foot 06/26/2018  . Right ankle pain 06/21/2018  . Right foot pain 06/21/2018  . Abnormal CXR 04/26/2017  . Lobar pneumonia ([place]) 01/20/2017  . Acute on chronic respiratory failure with hypoxia ([place]) 01/17/2017  . Acute respiratory failure with hypoxia ([place]) 01/17/2017  . DOE (dyspnea on exertion) 10/23/2014  . Hyponatremia 10/23/2014  . RLL pneumonia ([place]) 10/11/2013  . Other specified anemias 10/10/2013  . Acute respiratory failure ([place]) 08/15/2013  . Aspiration pneumonia ([place]) 08/15/2013  . Altered mental status 08/15/2013  . Incarcerated femoral hernia 08/14/2013  . Esophageal reflux 08/05/2013  . Unspecified constipation 07/03/2013  . Closed fracture of left hip ([place]) 07/01/2013  . 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 Atrial fibrillation ([place]) 10/30/2012  . Low back pain 10/26/2012  . Syncope 10/26/2012  . PNA (pneumonia) 09/26/2012  . UTI  (lower urinary tract infection) 09/26/2012  . Hypokalemia 09/26/2012  . Leukocytosis 09/26/2012  . Cardiac enzymes elevated 07/25/2011  . Impaired glucose tolerance 06/03/2011  . Preventative health care 06/03/2011  . IRON DEFICIENCY 12/06/2010  . 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 CVA (cerebral vascular accident) ([place]) 01/09/2009  . Aphasia 01/09/2009  . Urinary incontinence 01/09/2009  . PACEMAKER, PERMANENT 01/09/2009  . Dementia ([place]) 05/19/2008  . Hypothyroidism 11/20/2007  . Hyperlipidemia 11/20/2007  . Essential hypertension 11/20/2007  . DIVERTICULOSIS, COLON 11/20/2007  . FATIGUE 11/20/2007  . CEREBROVASCULAR ACCIDENT, HX OF 11/20/2007  . TIA 05/14/2003    Orientation RESPIRATION BLADDER Height & Weight     Self, Time, Situation, Place  Normal Incontinent(Pt can be continent during the day with timed toileting, but used briefs and pads prior to admisison.) Weight: 150 lb 2.1 oz (68.1 kg) Height:  5\' 5"  (165.1 cm)  BEHAVIORAL SYMPTOMS/MOOD NEUROLOGICAL BOWEL NUTRITION STATUS      Incontinent(Pt can be continent of stool, just does not initiate the need.) Diet(Dysphagia 3 diet with thin liquids; whole medications in puree)  AMBULATORY STATUS COMMUNICATION OF NEEDS Skin   Extensive Assist(minimal to moderate assistance 30' with rolling walkerl.) Verbally(needs extra time and sometimes options need to be given for her to choose) Other (Comment)(MASD to gluteal folds and perineum - skin barrier applied)                       Personal Care Assistance Level of Assistance  Bathing, Feeding, Dressing Bathing Assistance: Limited assistance Feeding assistance: Limited assistance(intermittent supervision currently) Dressing Assistance: Limited  assistance     Functional Limitations Info  Hearing, Speech   Hearing Info: Impaired Speech Info: Impaired(aphasia from previous stroke)    SPECIAL CARE FACTORS FREQUENCY  Blood pressure, PT (By licensed PT), OT (By licensed OT),  Bowel and bladder program,  Blood Pressure Frequency: once a day/facility protocol   PT Frequency: 5x/week OT Frequency: 5x/week Bowel and Bladder Program Frequency: timed toileting during the day         Contractures Contractures Info: Not present    Additional Factors Info  Code Status, Allergies Code Status Info: full code Allergies Info: codeine           Current Medications (12/26/2018):  This is the current hospital active medication list Current Facility-Administered Medications  Medication Dose Route Frequency Provider Last Rate Last Dose  . acetaminophen (TYLENOL) tablet 325-650 mg  325-650 mg Oral Q4H PRN Bary Leriche, PA-C   650 mg at 12/20/18 1722  . alum & mag hydroxide-simeth (MAALOX/MYLANTA) 200-200-20 MG/5ML suspension 30 mL  30 mL Oral Q4H PRN Love, Pamela S, PA-C      . amLODipine (NORVASC) tablet 10 mg  10 mg Oral Daily Bary Leriche, PA-C   10 mg at 12/26/18 9150  . aspirin chewable tablet 81 mg  81 mg Oral Daily Charlett Blake, MD   81 mg at 12/26/18 5697  . bisacodyl (DULCOLAX) suppository 10 mg  10 mg Rectal Daily PRN Bary Leriche, PA-C   10 mg at 12/09/18 0516  . diphenhydrAMINE (BENADRYL) 12.5 MG/5ML elixir 12.5-25 mg  12.5-25 mg Oral Q6H PRN Love, Pamela S, PA-C      . donepezil (ARICEPT) tablet 10 mg  10 mg Oral Daily Bary Leriche, PA-C   10 mg at 12/26/18 0951  . enoxaparin (LOVENOX) injection 40 mg  40 mg Subcutaneous Q24H Love, Pamela S, PA-C   40 mg at 12/25/18 1754  . guaiFENesin-dextromethorphan (ROBITUSSIN DM) 100-10 MG/5ML syrup 5-10 mL  5-10 mL Oral Q6H PRN Love, Pamela S, PA-C      . levothyroxine (SYNTHROID, LEVOTHROID) tablet 125 mcg  125 mcg Oral Q0600 Bary Leriche, PA-C   125 mcg at 12/26/18 0537  . losartan (COZAAR) tablet 100 mg  100 mg Oral Daily Bary Leriche, PA-C   100 mg at 12/26/18 9480  . MEDLINE mouth rinse  15 mL Mouth Rinse BID Bary Leriche, PA-C   15 mL at 12/26/18 1047  . multivitamin with minerals tablet 1  tablet  1 tablet Oral q morning - 10a Love, Ivan Anchors, PA-C   1 tablet at 12/26/18 0951  . pantoprazole sodium (PROTONIX) 40 mg/20 mL oral suspension 40 mg  40 mg Oral QHS Charlett Blake, MD   40 mg at 12/25/18 2111  . polyethylene glycol (MIRALAX / GLYCOLAX) packet 17 g  17 g Oral Daily PRN Bary Leriche, PA-C   17 g at 12/08/18 2017  . potassium chloride 20 MEQ/15ML (10%) solution 20 mEq  20 mEq Oral Daily Kirsteins, Luanna Salk, MD   20 mEq at 12/26/18 1655  . pravastatin (PRAVACHOL) tablet 40 mg  40 mg Oral QHS Bary Leriche, PA-C   40 mg at 12/25/18 2111  . prochlorperazine (COMPAZINE) tablet 5-10 mg  5-10 mg Oral Q6H PRN Love, Pamela S, PA-C       Or  . prochlorperazine (COMPAZINE) injection 5-10 mg  5-10 mg Intramuscular Q6H PRN Love, Ivan Anchors, PA-C       Or  .  prochlorperazine (COMPAZINE) suppository 12.5 mg  12.5 mg Rectal Q6H PRN Love, Pamela S, PA-C      . sodium phosphate (FLEET) 7-19 GM/118ML enema 1 enema  1 enema Rectal Once PRN Love, Pamela S, PA-C      . traZODone (DESYREL) tablet 25-50 mg  25-50 mg Oral QHS PRN Love, Pamela S, PA-C   50 mg at 12/23/18 2039     Discharge Medications: Please see discharge summary for a list of discharge medications.  Relevant Imaging Results:  Relevant Lab Results:   Additional Information SSN:   [place], Silvestre Mesi, [place]

## 2018-12-20 NOTE — Progress Notes (Deleted)
Speech Language Pathology Daily Session Note  Patient Details  Name: [name] MRN: [id number] Date of Birth: 1922/09/08  Today's Date: 12/20/2018 SLP Individual Time: 0730-0830 SLP Individual Time Calculation (min): 60 min  Short Term Goals: Week 2: SLP Short Term Goal 1 (Week 2): Given verbal choice of 2 items, pt will verbalize choice in timely manner with Min A cues in 9 out of 10 opportunities.  SLP Short Term Goal 2 (Week 2): Given verbal choice of 2 items, pt will produce correct word intelligibility in 8 out of 10 opportunities with Min A cues.  SLP Short Term Goal 3 (Week 2): Given Mod A cues, pt will follow 1 step directions in 8 out of 10 opportunties.  SLP Short Term Goal 4 (Week 2): Given Min A cues, pt will name common familiar items accuracy in 8 out of 10 opportunities.  SLP Short Term Goal 5 (Week 2): Pt will cosnume trials of dysphagia 2 with minimal overt s/s of aspiration or dyphagia with Min A cues over 3 sessions to demonstrate readiness for diet upgrade.   Skilled Therapeutic Interventions:  Skilled treatment session focused on dysphagia, communication and cognitive goals. SLP facilitated session by providing skilled observation of pt consuming dysphagia 3 breakfast tray. Pt with intermittent trace oral residue that was functional and cleared throughout meal. Recommend upgrade to dysphagia 3 and intermittent supervision d/t pt is independent with self-feeding and no significant amounts of oral residue. Pt was effective in communicating basic wants and needs in regards to set-up (number of creamers etc) and produced familiar phrases in timely manner when interacting with MD and this Probation officer. Pt able to follow 1 step directions with Min A verbal and contextual cues in 8 out of 10 opportunities. All information updated in room regarding diet upgrade. Pt was left upright in bed, bed alarm on and all needs within reach. Continue per current plan of care.      Pain Pain  Assessment Pain Scale: 0-10 Pain Score: 0-No pain  Therapy/Group: Individual Therapy  Lanay Zinda 12/20/2018, 9:09 AM

## 2018-12-20 NOTE — Progress Notes (Signed)
Speech Language Pathology Discharge Summary  Patient Details  Name: [name] MRN: [id number] Date of Birth: 1922/05/02  Today's Date: 12/20/2018 SLP Individual Time: 0730-0830 SLP Individual Time Calculation (min): 60 min   Skilled Therapeutic Interventions:  Skilled treatment session focused on dysphagia, communication and cognitive goals. SLP facilitated session by providing skilled observation of pt consuming dysphagia 3 breakfast tray. Pt with intermittent trace oral residue that was functional and cleared throughout meal. Recommend upgrade to dysphagia 3 and intermittent supervision d/t pt is independent with self-feeding and no significant amounts of oral residue. All information updated in room regarding diet upgrade. Of note, pt continues to sigh during consumption of more advanced solids. Pt appears fatigued by increased solid textures and has difficulty cutting items, therefore pt is content with current diet, no further indication for diet upgrade to regular.     Pt was effective in communicating basic wants and needs in regards to set-up (number of creamers etc) and produced familiar phrases in timely manner when interacting with MD and this Probation officer. No perseverative motor patterns. Pt able to follow 1 step directions with Min A verbal and contextual cues in 8 out of 10 opportunities.  Patient has met 4 of 4 long term goals.  Patient to discharge at overall (pt is at baseline ability per report gathered during initial evaluation) level.  Reasons goals not met:     Clinical Impression/Discharge Summary:   Pt has made progress during skilled ST sessions. During the initial evaluation, pt's acute deficits were identified as delayed verbal responses, perseverative verbal/motor patterns and inability to follow 1 step directions as well as diet consuming solid textures at baseline. At this time, pt's expressive abilities are considered back at baseline as her verbal responses are  timely, she is able to express her wants and needs at the word and simple phrase level, she produces her familiar phrases without perseverative motor patterns and she is consuming dysphagia 3 diet. At this time, pt's acute deficits have been resolved. ST is no longer indicated.   Care Partner:  Caregiver Able to Provide Assistance: No  Type of Caregiver Assistance: Physical;Cognitive  Recommendation:  Skilled Nursing facility;24 hour supervision/assistance - d/t physical deficits in the setting of chronic dementia and aphasia.       Reasons for discharge: Treatment goals met   Patient/Family Agrees with Progress Made and Goals Achieved: (pt unable to communicate)    Alvie Speltz 12/20/2018, 10:19 AM

## 2018-12-21 ENCOUNTER — Inpatient Hospital Stay (HOSPITAL_COMMUNITY): Payer: Medicare Other

## 2018-12-21 ENCOUNTER — Ambulatory Visit: Payer: Medicare Other | Admitting: Internal Medicine

## 2018-12-21 ENCOUNTER — Inpatient Hospital Stay (HOSPITAL_COMMUNITY): Payer: Medicare Other | Admitting: Occupational Therapy

## 2018-12-21 ENCOUNTER — Inpatient Hospital Stay (HOSPITAL_COMMUNITY): Payer: Medicare Other | Admitting: Physical Therapy

## 2018-12-21 NOTE — Progress Notes (Signed)
Physical Therapy Session Note  Patient Details  Name: [name] MRN: [id number] Date of Birth: 1922/06/18  Today's Date: 12/21/2018 PT Individual Time: 1136-1236 PT Individual Time Calculation (min): 60 min   Week 2:  PT Short Term Goal 1 (Week 2): Pt will ambulate at least 25ft using LRAD with no more than mod A PT Short Term Goal 2 (Week 2): Pt will consistently perform sit<>stand transfers using LRAD with no more than mod A PT Short Term Goal 3 (Week 2): Pt will stand statically using LRAD for 2 minutes with no more than min A  Skilled Therapeutic Interventions/Progress Updates:     Patient in w/c upon PT arrival. Patient alert and agreeable to PT session.  Therapeutic Activity: Transfers: Patient performed sit to/from stand x6 with mod-min A. Provided verbal cues for hand placement for pushing up from the w/c, reaching back before sitting, scooting feet back, and leaning forward. Patient performed better with verbal cue of counting to 3 before standing. Patient reported dizziness in standing after first trial, see vitals below, however did not report any other episodes of dizziness throughout session and BP remained WFL, see vitals.   Gait Training:  Patient ambulated 30 feet using RW with min A. Ambulated with decreased gait speed, decreased step length, R more than L, forward trunk lean, downward head gaze, and alternating step-to leading with L foot and step-through pattern. Provided verbal cues for erect posture, taking big steps on the R, moving the RW around herself for tighter turns, and looking ahead.  Neuromuscular Re-ed: Patient performed stepping forwards a backward with each foot in standing with RW with min A for physical support and balance x10 for 3 trials. Provided visual cue on the right for first and second trial and last trial was performed in front of a mirror having patient look in the mirror to improve midline posture and provided music with improved speed  and step length.   Patient in w/c at end of session with breaks locked, seat belt alarm set, and all needs within reach.   Therapy Documentation Precautions:  Precautions Precautions: Fall Restrictions Weight Bearing Restrictions: No Vital Signs: Sitting beginning of session: BP: 143/64 HR: 66 O2: 92% Sitting after standing x1 with reports of dizziness: BP: 127/54 Sitting after 2 min: BP:134/62 Sitting at end of session: BP: 137/66 HR: 69 O2: 96%  Pain: Patient stated she had pain all over at beginning of session. She nodded yes when asked if she felt sore, and no to other options. Patient was repositioned and increased activity and ambulation during PT session. Patient reported that her pain felt better at end of session. Patient unable to rate pain due to baseline cognitive deficits and aphasia, however, did not show and visual or verbal signs of pain throughout session.   Therapy/Group: Individual Therapy  Doreene Burke, PT, DPT 12/21/2018, 12:51 PM

## 2018-12-21 NOTE — Progress Notes (Signed)
Occupational Therapy Weekly Progress Note  Patient Details  Name: [name] MRN: [id number] Date of Birth: June 14, 1922  Beginning of progress report period: December 15, 2018 End of progress report period: December 21, 2018  Today's Date: 12/21/2018 OT Individual Time: 5974-1638 OT Individual Time Calculation (min): 75 min    Patient has met 0 of 4 short term goals.  Ms. Eardley has made fluctuating progress this week secondary to motor planning issues as well as initiation and balance issues.  She continues to need min assist for UB bathing with max instructional cueing for sequncing as well as mod to max assist for LB bathing sit to stand.  UB and LB dressing are at a max assist overall as pt demonstrates decreased orientation at times as well as decreased ability to coordinate the ability to reach down to her LEs or stand to pull them up over her hips.  She continues to demonstrate fluctuating progress with sit to stand as well.  On certain occasions she needs max assist secondary to retropulsion and other times she is able to push up from the wheelchair or surface with almost min assist.  She does however continue to exhibit trunk and hip flexion in standing.  Feel overall her progress has been slower than expected and that she will continue to need comprehensive OT as well as 24 hour physical assist at discharge.  Family is understanding of this and is going to pursue SNF for follow-up.  Will continue with CIR level therapy until discharge with goal downgrade as this therapist feels she will not reach consistent min assist level for LB selfcare and transfers.  Patient continues to demonstrate the following deficits: muscle weakness, impaired timing and sequencing, unbalanced muscle activation, decreased coordination and decreased motor planning, decreased midline orientation and decreased motor planning, decreased initiation, decreased awareness, decreased problem solving, decreased safety  awareness, decreased memory and delayed processing and decreased sitting balance, decreased standing balance, hemiplegia and decreased balance strategies and therefore will continue to benefit from skilled OT intervention to enhance overall performance with BADL.  Patient not progressing toward long term goals.  See goal revision..  Continue plan of care.  OT Short Term Goals Week 3:  OT Short Term Goal 1 (Week 3): Continue working on downgraded OT goals set at min to max assist overall.  Skilled Therapeutic Interventions/Progress Updates:    Pt completed bathing and dressing during session.  She needed max assist for sequencing and completion of supine to sit EOB.  Once sitting, she needed use of the [facility] for sit to stand and transfer to the shower, as when therapist tried to have her use the RW, she would not flex her trunk forward for sit to stand without total assist.  She was able to complete bathing mostly in sitting with supervision, but needed max assist for sit to stand in order for therapist to wash her buttocks.  [name] was utilized for transfer to the toilet once she finished drying off secondary to bladder incontinence when standing.  She was able to void and needed max assist for toilet hygiene sit to stand.  All dressing was completed at the wheelchair in front of the sink.  Max assist for all tasks with delayed ability to problem solve and execute the task throughout.  Mod assist for sit to stand from the wheelchair when pulling up garments over hips.  Did not during session that pt exhibited a left lean most of the time compared to previous weeks  where she exhibited pushing to the right side.  No noted difference in UE functional use however.  Pt left with call button and phone in reach with alarm belt in place.   Therapy Documentation Precautions:  Precautions Precautions: Fall Restrictions Weight Bearing Restrictions: No  Pain: Pain Assessment Pain Scale: Faces Pain Score: 0-No  pain ADL: See Care Tool for some details of ADL  Therapy/Group: Individual Therapy  Laylah Riga OTR/L 12/21/2018, 11:53 AM

## 2018-12-21 NOTE — Progress Notes (Signed)
Occupational Therapy Session Note  Patient Details  Name: [name] MRN: [id number] Date of Birth: July 30, 1922  Today's Date: 12/21/2018 OT Individual Time: 6761-9509 OT Individual Time Calculation (min): 41 min    Short Term Goals: Week 2:  OT Short Term Goal 1 (Week 2): Pt will complete toilet transfers stand pivot with or without RW and mod assist. OT Short Term Goal 2 (Week 2): Pt will complete UB dressing with min assist in supported sitting. OT Short Term Goal 2 - Progress (Week 2): Not met OT Short Term Goal 3 (Week 2): Pt will complete LB bathing with min assist sit to stand.  OT Short Term Goal 3 - Progress (Week 2): Not met OT Short Term Goal 4 (Week 2): Pt will complete LB dressing with min assist sit to stand.   OT Short Term Goal 4 - Progress (Week 2): Not met  Skilled Therapeutic Interventions/Progress Updates:    1;1. Pt received in w/c finishing lunch. Pt reporting need to toilet. Pt completes stand pivot transfer with min A using grab bar. Pt requires total A for components of toileting after increased time for B&B void. Pt completes standing tolerance activity in kitchen sorting silverware into organizer and retrieving items from medium-high shelves for dynamic reaching, side stepping and standing balnace training. Pt with 100% accuracy sorting silverware. Exited session with pt seated in w/c, call light in reach and all needs met  Therapy Documentation Precautions:  Precautions Precautions: Fall Restrictions Weight Bearing Restrictions: No General:   Vital Signs:  Pain:   ADL: ADL Grooming: Minimal assistance Where Assessed-Grooming: Sitting at sink Upper Body Bathing: Moderate assistance Where Assessed-Upper Body Bathing: Sitting at sink, Standing at sink Lower Body Bathing: Dependent(+2) Where Assessed-Lower Body Bathing: Standing at sink([place]) Upper Body Dressing: Maximal assistance Where Assessed-Upper Body Dressing: Sitting at sink Lower  Body Dressing: Dependent(+2) Vision   Perception    Praxis   Exercises:   Other Treatments:     Therapy/Group: Individual Therapy  Tonny Branch 12/21/2018, 2:28 PM

## 2018-12-21 NOTE — Progress Notes (Signed)
[facility] PHYSICAL MEDICINE & REHABILITATION PROGRESS NOTE   Subjective/Complaints:  "happy, happy, happy"  persverative Has some problems with repetition   ROS: limited due to language/communication    Objective:   No results found. Recent Labs    12/19/18 0536  WBC 5.1  HGB 13.1  HCT 38.0  PLT 419*   Recent Labs    12/19/18 0536  NA 138  K 4.0  CL 105  CO2 20*  GLUCOSE 96  BUN 21  CREATININE 0.84  CALCIUM 8.5*    Intake/Output Summary (Last 24 hours) at 12/21/2018 0753 Last data filed at 12/20/2018 1300 Gross per 24 hour  Intake 120 ml  Output -  Net 120 ml     Physical Exam: Vital Signs Blood pressure (!) 132/55, pulse 60, temperature 98.1 F (36.7 C), temperature source Oral, resp. rate 14, height 5\' 5"  (1.651 m), weight 65.2 kg, SpO2 93 %. Constitutional: No distress . Vital signs reviewed. HEENT: EOMI, oral membranes moist Neck: supple Cardiovascular: RRR without murmur. No JVD    Respiratory: CTA Bilaterally without wheezes or rales. Normal effort    GI: BS +, non-tender, non-distended  Neurological: She isalert. Expressive greater than receptive aphasia persists. Followed most simple commands although usually with delay.  Apraxic. RUE 4/5 and 3- RLE  LUE and LLE 4 to 4+/5. Psych:drowsy but awakens to physical stim from sleep    Assessment/Plan: 1. Functional deficits secondary to left frontal infarct which require 3+ hours per day of interdisciplinary therapy in a comprehensive inpatient rehab setting.  Physiatrist is providing close team supervision and 24 hour management of active medical problems listed below.  Physiatrist and rehab team continue to assess barriers to discharge/monitor patient progress toward functional and medical goals  Care Tool:  Bathing  Bathing activity did not occur: Safety/medical concerns Body parts bathed by patient: Right arm, Left arm, Chest, Abdomen, Face, Right upper leg, Left upper leg, Right lower leg,  Left lower leg   Body parts bathed by helper: Front perineal area, Buttocks Body parts n/a: Front perineal area, Buttocks   Bathing assist Assist Level: Moderate Assistance - Patient 50 - 74%     Upper Body Dressing/Undressing Upper body dressing   What is the patient wearing?: Bra, Pull over shirt    Upper body assist Assist Level: Maximal Assistance - Patient 25 - 49%    Lower Body Dressing/Undressing Lower body dressing      What is the patient wearing?: Incontinence brief, Pants     Lower body assist Assist for lower body dressing: Maximal Assistance - Patient 25 - 49%     Toileting Toileting    Toileting assist Assist for toileting: Maximal Assistance - Patient 25 - 49%     Transfers Chair/bed transfer  Transfers assist     Chair/bed transfer assist level: Moderate Assistance - Patient 50 - 74%(via stand pivot using RW)     Locomotion Ambulation   Ambulation assist   Ambulation activity did not occur: Safety/medical concerns  Assist level: Moderate Assistance - Patient 50 - 74% Assistive device: Walker-rolling Max distance: 38ft   Walk 10 feet activity   Assist  Walk 10 feet activity did not occur: Safety/medical concerns  Assist level: Moderate Assistance - Patient - 50 - 74% Assistive device: Walker-rolling   Walk 50 feet activity   Assist Walk 50 feet with 2 turns activity did not occur: Safety/medical concerns         Walk 150 feet activity   Assist  Walk 150 feet activity did not occur: Safety/medical concerns         Walk 10 feet on uneven surface  activity   Assist Walk 10 feet on uneven surfaces activity did not occur: Safety/medical concerns         Wheelchair     Assist Will patient use wheelchair at discharge?: Yes Type of Wheelchair: Manual Wheelchair activity did not occur: Safety/medical concerns  Wheelchair assist level: Minimal Assistance - Patient > 75% Max wheelchair distance: 164ft     Wheelchair 50 feet with 2 turns activity    Assist    Wheelchair 50 feet with 2 turns activity did not occur: Safety/medical concerns   Assist Level: Minimal Assistance - Patient > 75%, Moderate Assistance - Patient 50 - 74%(mod A for turning)   Wheelchair 150 feet activity     Assist Wheelchair 150 feet activity did not occur: Safety/medical concerns   Assist Level: Minimal Assistance - Patient > 75%    Medical Problem List and Plan: 1.Functional declinesecondary toLeft frontal/ACA infarct.             --Continue CIR therapies, SNF search in progress , .2. Antithrombotics: -DVT/anticoagulation:Pharmaceutical:Lovenox -antiplatelet therapy: Low dose ASA 3. Pain Management:Tylenol prn 4. Mood:LCSW to follow for evaluation and support. -antipsychotic agents: N/A 5. Neuropsych: This patientis not fullycapable of making decisions on onown behalf. 6. Skin/Wound Care:routine pressure relief measures. 7. Fluids/Electrolytes/Nutrition:encourage PO  -D2 thin liquids currently - po fluids 529ml yesterday  -sl hypokalemia--supplementing KCL 60meq daily   8. HTN: Monitor BP bid. Continue Amlodipine and Cozaar daily  -mild elevation this am no change to meds at this time Vitals:   12/20/18 1927 12/21/18 0601  BP: (!) 122/50 (!) 132/55  Pulse: (!) 59 60  Resp: 17 14  Temp: 98.6 F (37 C) 98.1 F (36.7 C)  SpO2: 97% 93%  HTN controlled 3/27 9. Dyslipidemia: On pravastatin.  10. 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   11. Mild hypo K supplement orally 12.  Dysphagia post stroke tolerating D3 thin liquids  LOS: 14 days A FACE TO FACE EVALUATION WAS PERFORMED  Charlett Blake 12/21/2018, 7:53 AM

## 2018-12-21 NOTE — Progress Notes (Addendum)
Physical Therapy Session Note  Patient Details  Name: [name] MRN: [id number] Date of Birth: 1921-10-08  Today's Date: 12/21/2018 PT Individual Time: 3354-5625 PT Individual Time Calculation (min): 58 min   Short Term Goals: Week 2:  PT Short Term Goal 1 (Week 2): Pt will ambulate at least 58ft using LRAD with no more than mod A PT Short Term Goal 2 (Week 2): Pt will consistently perform sit<>stand transfers using LRAD with no more than mod A PT Short Term Goal 3 (Week 2): Pt will stand statically using LRAD for 2 minutes with no more than min A  Skilled Therapeutic Interventions/Progress Updates:   Pt received sitting in w/c and agreeable to therapy session. Therapist suggested toileting with pt declining. Pt transported to/from therapy gym in w/c for energy conservation and time management. Pt performed dynamic standing balance task, standing on firm surface, of reaching with R UE to grasp and place cones from higher to lower surface - min assist for balance and frequent cuing for no L UE support and for increased trunk and hip extension to improve upright stance. Pt performed dynamic standing balance task of card identification with associated R UE higher level reaching with 1 seated rest break in middle of task due to increased fatigue with standing - therapist provided intermittent assist for raising RUE with repeated tactile and verbal cuing for improved trunk and hip extension for upright posture. Performed R LE step up/down on 2" step x4 with UE support on therapist and pt requiring significant increased time to initiate task and manual facilitation of L lateral weight shifting and R LE knee flexion to achieve step-up due to pt hesitancy for fear of falling. Pt performed stand pivot transfer w/c<>mat table with B UE support on therapist or w/c armrest with mod assist for lifting and balance. Pt performed sit<>stand transfers with or without RW with min assist > 75% of the time this  session with tactile cuing and manual facilitation for increased anterior trunk lean and for lifting hips. Pt ambulated 62ft using RW with min/mod assist and manual facilitation for increased L lateral weight shift during stance phase with pt demonstrating improved R LE step length this session. Pt returned to room and left seated in w/c with needs in reach, seat belt alarm on, and nursing staff present to continue working with pt.   Therapy Documentation Precautions:  Precautions Precautions: Fall Restrictions Weight Bearing Restrictions: No  Pain:   Reports no pain during therapy session.    Therapy/Group: Individual Therapy  Tawana Scale, PT, DPT 12/21/2018, 3:13 PM

## 2018-12-22 ENCOUNTER — Inpatient Hospital Stay (HOSPITAL_COMMUNITY): Payer: Medicare Other | Admitting: Speech Pathology

## 2018-12-22 ENCOUNTER — Inpatient Hospital Stay (HOSPITAL_COMMUNITY): Payer: Medicare Other

## 2018-12-22 ENCOUNTER — Inpatient Hospital Stay (HOSPITAL_COMMUNITY): Payer: Medicare Other | Admitting: Occupational Therapy

## 2018-12-22 NOTE — Progress Notes (Signed)
Physical Therapy Session Note  Patient Details  Name: [name] MRN: [id number] Date of Birth: 03/01/1922  Today's Date: 12/22/2018 PT Individual Time: 1255-1355 PT Individual Time Calculation (min): 60 min   Short Term Goals: Week 2:  PT Short Term Goal 1 (Week 2): Pt will ambulate at least 106ft using LRAD with no more than mod A PT Short Term Goal 2 (Week 2): Pt will consistently perform sit<>stand transfers using LRAD with no more than mod A PT Short Term Goal 3 (Week 2): Pt will stand statically using LRAD for 2 minutes with no more than min A  Skilled Therapeutic Interventions/Progress Updates:     Patient in w/c upon PT arrival. Patient alert and agreeable to PT session. Patient showed signs of increased fatigue during session today, requiring more assistance and delayed motor planing with all mobility and activities. When asked if she felt more tired today, she nodded her head yes and said "very." Vitals WNL, see below. RN notified of increased fatigued.  Therapeutic Activity: Transfers: Patient performed sit to/from stand x5 with mod A using a RW. Provided verbal cues for hand placement, initiating stand-counting to 3, and scooting forward before standing. She attempted to stand x1 before being able to complete the stand each time despite cues from PT and proper positioning. Patient performed a stand pivot transfer to and from the w/c to the [facility] over the toilet with mod A and significantly increased time. Presented with difficulty with pivoting with the RW requiring several cues from therapist to step and turn. She required total A with all tasks of toileting and had a successful BM and voided while on the commode, RN aware. Patient reported feeling very tired after toileting and was provided an active rest break while sorting a deck of cards by suit. She sorted the cards with 100% accuracy independently with the R hand and with hand over hand initially for the L UE then with min A  for flipping cards over, but was able to place them in the pile independently once they were flipped with the L hand.  Gait Training:  Patient ambulated 6 feet x1 and 8 feet x1 using RW with mod A. Ambulated with decreased gait speed, L pelvis posteriorly rotated, decreased weight shift to the L, increased knee flexion in stance on R, decreased step length on R, flexed trunk, and downward head gaze. Provided verbal cues for erect posture, straitening her R knee in stance, and facilitation and cues to weight shift onto her L LE.  Neuromuscular Re-ed: Patient performed sitting and reaching activity for 5 min x2 sitting on the edge of her w/c without back support, reaching to the side, forward, across midline, and down with a deck of cards, returning to midline and placing them in a pile on her L each time. Initially therapist asked her to state the number providing several cues, however, patient was unsuccessful with this task and was able to nod yes or no when therapist provided options for which card it was.   Patient in w/c at end of session with breaks locked, seat belt alarm set, and all needs within reach.   Therapy Documentation Precautions:  Precautions Precautions: Fall Restrictions Weight Bearing Restrictions: No    Vital Signs: Therapy Vitals Pulse Rate: 63 BP: (!) 123/56 Patient Position (if appropriate): Sitting Oxygen Therapy SpO2: 99 % O2 Device: Room Air Pain:  Patient denied pain during session.   Therapy/Group: Individual Therapy  Doreene Burke, PT, DPT 12/22/2018,  3:28 PM

## 2018-12-22 NOTE — Plan of Care (Signed)
  Problem: Consults Goal: RH STROKE PATIENT EDUCATION Description See Patient Education module for education specifics  Outcome: Progressing   Problem: RH BOWEL ELIMINATION Goal: RH STG MANAGE BOWEL WITH ASSISTANCE Description STG Manage Bowel with Mod/ Max Assistance.  Outcome: Progressing   Problem: RH BLADDER ELIMINATION Goal: RH STG MANAGE BLADDER WITH ASSISTANCE Description STG Manage Bladder With Mod/ Max Assistance  Outcome: Progressing   Problem: RH SKIN INTEGRITY Goal: RH STG SKIN FREE OF INFECTION/BREAKDOWN Description No new breakdown with min assist   Outcome: Progressing   Problem: RH SAFETY Goal: RH STG ADHERE TO SAFETY PRECAUTIONS W/ASSISTANCE/DEVICE Description STG Adhere to Safety Precautions With Mod Assistance/Device.  Outcome: Progressing   Problem: RH COGNITION-NURSING Goal: RH STG USES MEMORY AIDS/STRATEGIES W/ASSIST TO PROBLEM SOLVE Description STG Uses Memory Aids/Strategies With Min Assistance to Problem Solve.  Outcome: Progressing   Problem: RH PAIN MANAGEMENT Goal: RH STG PAIN MANAGED AT OR BELOW PT'S PAIN GOAL Description < 3 out of 10.   Outcome: Progressing   

## 2018-12-22 NOTE — Progress Notes (Signed)
Occupational Therapy Session Note  Patient Details  Name: [name] MRN: [id number] Date of Birth: 1921/09/28  Today's Date: 12/22/2018 OT Individual Time: 1003-1100 OT Individual Time Calculation (min): 57 min    Short Term Goals: Week 3:  OT Short Term Goal 1 (Week 3): Continue working on downgraded OT goals set at min to max assist overall.  Skilled Therapeutic Interventions/Progress Updates:    Pt completed functional transfer from bed to wheelchair with mod assist stand pivot using the RW.  Decreased ability to shift her weight or initiate RLE movement efficiently.  Once in the chair, she worked on bathing and dressing sit to stand at the sink. Max instructional cueing for sequencing all bathing with supervision for UB with mod assist for LB sit to stand.  Once bathed, she needed mod assist for donning bra and pullover shirt with max assist for all LB dressing.  Sit to stand transitions with the LUE pushing up from the wheelchair and the RUE on the sink were completed at mod assist level.  Finished session with pt up in the wheelchair and call button and phone in reach.  Alarm belt in place as well.    Therapy Documentation Precautions:  Precautions Precautions: Fall Restrictions Weight Bearing Restrictions: No  Pain: Pain Assessment Pain Scale: Faces Pain Score: 0-No pain ADL: See Care Tool Section for some details of ADL  Therapy/Group: Individual Therapy  Karlin Binion OTR/L 12/22/2018, 12:29 PM

## 2018-12-23 ENCOUNTER — Inpatient Hospital Stay (HOSPITAL_COMMUNITY): Payer: Medicare Other

## 2018-12-23 ENCOUNTER — Inpatient Hospital Stay (HOSPITAL_COMMUNITY): Payer: Medicare Other | Admitting: Occupational Therapy

## 2018-12-23 ENCOUNTER — Inpatient Hospital Stay (HOSPITAL_COMMUNITY): Payer: Medicare Other | Admitting: Speech Pathology

## 2018-12-23 NOTE — Progress Notes (Signed)
PHYSICAL MEDICINE & REHABILITATION PROGRESS NOTE   Subjective/Complaints:  The patient is resting quietly.  She remains severely aphasic.  She does follow simple commands.   ROS: limited due to language/communication    Objective:   No results found. No results for input(s): WBC, HGB, HCT, PLT in the last 72 hours. No results for input(s): NA, K, CL, CO2, GLUCOSE, BUN, CREATININE, CALCIUM in the last 72 hours.  Intake/Output Summary (Last 24 hours) at 12/23/2018 1227 Last data filed at 12/23/2018 0845 Gross per 24 hour  Intake 580 ml  Output -  Net 580 ml     Physical Exam: Vital Signs Blood pressure (!) 133/55, pulse 61, temperature (!) 97.5 F (36.4 C), temperature source Oral, resp. rate 16, height 5\' 5"  (1.651 m), weight 65.2 kg, SpO2 95 %. Constitutional: No distress . Vital signs reviewed. HEENT: EOMI, oral membranes moist Neck: supple Cardiovascular: RRR without murmur. No JVD    Respiratory: CTA Bilaterally without wheezes or rales. Normal effort    GI: BS +, non-tender, non-distended  Neurological: She isalert. Expressive greater than receptive aphasia persists. Followed most simple commands although usually with delay.  Apraxic. RUE 4/5 and 3- RLE  LUE and LLE 4 to 4+/5.  Unchanged psych:drowsy but awakens to physical stim from sleep    Assessment/Plan: 1. Functional deficits secondary to left frontal infarct which require 3+ hours per day of interdisciplinary therapy in a comprehensive inpatient rehab setting.  Physiatrist is providing close team supervision and 24 hour management of active medical problems listed below.  Physiatrist and rehab team continue to assess barriers to discharge/monitor patient progress toward functional and medical goals  Care Tool:  Bathing  Bathing activity did not occur: Safety/medical concerns Body parts bathed by patient: Right arm, Left arm, Chest, Abdomen, Right upper leg, Left upper leg, Right lower leg,  Left lower leg, Face   Body parts bathed by helper: Buttocks Body parts n/a: Front perineal area, Buttocks   Bathing assist Assist Level: Moderate Assistance - Patient 50 - 74%     Upper Body Dressing/Undressing Upper body dressing   What is the patient wearing?: Bra, Pull over shirt    Upper body assist Assist Level: Moderate Assistance - Patient 50 - 74%    Lower Body Dressing/Undressing Lower body dressing      What is the patient wearing?: Incontinence brief, Pants     Lower body assist Assist for lower body dressing: Maximal Assistance - Patient 25 - 49%     Toileting Toileting    Toileting assist Assist for toileting: Maximal Assistance - Patient 25 - 49%     Transfers Chair/bed transfer  Transfers assist     Chair/bed transfer assist level: Moderate Assistance - Patient 50 - 74%     Locomotion Ambulation   Ambulation assist   Ambulation activity did not occur: Safety/medical concerns  Assist level: Moderate Assistance - Patient 50 - 74% Assistive device: Walker-rolling Max distance: 6'   Walk 10 feet activity   Assist  Walk 10 feet activity did not occur: Safety/medical concerns  Assist level: Moderate Assistance - Patient - 50 - 74% Assistive device: Walker-rolling   Walk 50 feet activity   Assist Walk 50 feet with 2 turns activity did not occur: Safety/medical concerns         Walk 150 feet activity   Assist Walk 150 feet activity did not occur: Safety/medical concerns         Walk 10 feet on uneven  surface  activity   Assist Walk 10 feet on uneven surfaces activity did not occur: Safety/medical concerns         Wheelchair     Assist Will patient use wheelchair at discharge?: Yes Type of Wheelchair: Manual Wheelchair activity did not occur: Safety/medical concerns  Wheelchair assist level: Minimal Assistance - Patient > 75% Max wheelchair distance: 124ft    Wheelchair 50 feet with 2 turns  activity    Assist    Wheelchair 50 feet with 2 turns activity did not occur: Safety/medical concerns   Assist Level: Minimal Assistance - Patient > 75%, Moderate Assistance - Patient 50 - 74%(mod A for turning)   Wheelchair 150 feet activity     Assist Wheelchair 150 feet activity did not occur: Safety/medical concerns   Assist Level: Minimal Assistance - Patient > 75%    Medical Problem List and Plan: 1.Functional declinesecondary toLeft frontal/ACA infarct.             --Continue CIR therapies, SNF search in progress , .2. Antithrombotics: -DVT/anticoagulation:Pharmaceutical:Lovenox -antiplatelet therapy: Low dose ASA 3. Pain Management:Tylenol prn 4. Mood:LCSW to follow for evaluation and support. -antipsychotic agents: N/A 5. Neuropsych: This patientis not fullycapable of making decisions on onown behalf. 6. Skin/Wound Care:routine pressure relief measures. 7. Fluids/Electrolytes/Nutrition:encourage PO  -D2 thin liquids currently - po fluids 560ml yesterday  -sl hypokalemia--supplementing KCL 26meq daily   8. HTN: Monitor BP bid. Continue Amlodipine and Cozaar daily  -mild elevation this am no change to meds at this time Vitals:   12/22/18 1935 12/23/18 0407  BP: (!) 118/58 (!) 133/55  Pulse: 63 61  Resp: 16 16  Temp: 98.1 F (36.7 C) (!) 97.5 F (36.4 C)  SpO2: 96% 95%  HTN controlled 3/29 9. Dyslipidemia: On pravastatin.  10. 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   11. Mild hypo K supplement orally 12.  Dysphagia post stroke tolerating D3 thin liquids ate 100% of breakfast this morning LOS: 16 days A FACE TO FACE EVALUATION WAS PERFORMED  Charlett Blake 12/23/2018, 12:27 PM

## 2018-12-23 NOTE — Progress Notes (Signed)
Occupational Therapy Session Note  Patient Details  Name: [name] MRN: [id number] Date of Birth: Jan 16, 1922  Today's Date: 12/23/2018 OT Individual Time: 0233-4356 OT Individual Time Calculation (min): 40 min   Skilled Therapeutic Interventions/Progress Updates:    Pt greeted in recliner, nodding head when asked if she wanted to brush her teeth. Attempted to facilitate forward scooting using multimodal cues, however pt unable to initiate herself and resistant to manual facilitation. Worked on initiation for sit<stand in [place] to complete oral care in standing. After 14 minutes of attempting, pt unable to initiate or assist OT in transfer, and therefore she completed stated tasks while seated. Tx focus on motor planning while sitting at sink, with pt needing max multimodal cues and increased time to brush teeth and wash her hands. She tried twice to bring soap bottle to her mouth. At end of session pt was left in recliner with all needs within reach and safety belt fastened.    Therapy Documentation Precautions:  Precautions Precautions: Fall Restrictions Weight Bearing Restrictions: No Pain: No s/s pain during tx    ADL: ADL Grooming: Minimal assistance Where Assessed-Grooming: Sitting at sink Upper Body Bathing: Moderate assistance Where Assessed-Upper Body Bathing: Sitting at sink, Standing at sink Lower Body Bathing: Dependent(+2) Where Assessed-Lower Body Bathing: Standing at sink([place]) Upper Body Dressing: Maximal assistance Where Assessed-Upper Body Dressing: Sitting at sink Lower Body Dressing: Dependent(+2)      Therapy/Group: Individual Therapy  Theador Jezewski A Rayen Dafoe 12/23/2018, 4:08 PM

## 2018-12-23 NOTE — Progress Notes (Signed)
Physical Therapy Session Note  Patient Details  Name: [name] MRN: [id number] Date of Birth: 1922/09/02  Today's Date: 12/23/2018 PT Individual Time: 1020-1101 PT Individual Time Calculation (min): 41 min   Short Term Goals: Week 2:  PT Short Term Goal 1 (Week 2): Pt will ambulate at least 77ft using LRAD with no more than mod A PT Short Term Goal 2 (Week 2): Pt will consistently perform sit<>stand transfers using LRAD with no more than mod A PT Short Term Goal 3 (Week 2): Pt will stand statically using LRAD for 2 minutes with no more than min A  Skilled Therapeutic Interventions/Progress Updates:    Pt seated in recliner upon PT arrival, agreeable to therapy tx and denies pain. Pt transported to the gym. Pt performed stand pivot to the mat with mod assist, cues for techniques. In sitting pt with noted R lateral lean this session, pt worked on sitting balance and coming to midline with visual feedback using the mirror and tactile feedback to bring pts L shoulder to therapists shoulder. Pt worked on sitting balance while performing L lateral reaching task with hand over hand assist to encourage leaning outside BOS. Pt performed x 1 sit<>stand from elevated mat with RW and min assist, in standing worked on upright posture and reaching activity. Pt performed x 2 sit<>stands without AD and L UE over therapists shoulder to encourage L lateral weightshift, therapist providing manual facilitation and cues for L lateral weightshift and hip extension, mirror for visual feedback. Pt transferred to w/c mod assist, stand pivot. Pt transported back to room and performed stand pivot to recliner mod assist. Pt left in recliner with needs in reach and chair alarm set.  Therapy Documentation Precautions:  Precautions Precautions: Fall Restrictions Weight Bearing Restrictions: No    Therapy/Group: Individual Therapy  Netta Corrigan, PT, DPT 12/23/2018, 11:01 AM

## 2018-12-23 NOTE — Progress Notes (Signed)
Occupational Therapy Session Note  Patient Details  Name: [name] MRN: [id number] Date of Birth: 08-31-1922  Today's Date: 12/23/2018 OT Individual Time: 2409-7353 OT Individual Time Calculation (min): 56 min   Short Term Goals: Week 3:  OT Short Term Goal 1 (Week 3): Continue working on downgraded OT goals set at min to max assist overall.  Skilled Therapeutic Interventions/Progress Updates:    Pt greeted in bed, giggling and stating "no" when asked if she had pain. During transitional movements, pt sometimes stating "ow" but unable to describe or name pain. Worked on bathing/dressing EOB sit<stand to challenge sitting balance. Max A for supine<sit and steady assist for sitting balance initially due to Rt lean. Provided tactile and manual cuing to correct leaning posture, however no carryover exhibited due to cognitive deficits. Max vcs required for sequencing of all tasks with pt exhibiting perseverative tendencies. Unable to have pt maintain safe hand position when powering up using RW, so device was not used. Max A sit<stand with significant posterior lean. Max A for balance when pt completed perihygiene. While seated, she was able to achieve figure 4 with L LE during bathing, but unable to do so with R LE. During dressing, Rt lean intensified and pt unable to safely stand with 1 assist. 2 helper present to elevate pants over hips and complete squat pivot transfer to w/c. After handwashing, pt was left with all needs within reach, safety belt fastened, in care of RN.    Therapy Documentation Precautions:  Precautions Precautions: Fall Restrictions Weight Bearing Restrictions: No  Pain: Pain Assessment Pain Scale: 0-10 Pain Score: 0-No pain ADL: ADL Grooming: Minimal assistance Where Assessed-Grooming: Sitting at sink Upper Body Bathing: Moderate assistance Where Assessed-Upper Body Bathing: Sitting at sink, Standing at sink Lower Body Bathing: Dependent(+2) Where  Assessed-Lower Body Bathing: Standing at sink(Stedy) Upper Body Dressing: Maximal assistance Where Assessed-Upper Body Dressing: Sitting at sink Lower Body Dressing: Dependent(+2)      Therapy/Group: Individual Therapy  Jozie Wulf A Lorette Peterkin 12/23/2018, 12:38 PM

## 2018-12-24 ENCOUNTER — Inpatient Hospital Stay (HOSPITAL_COMMUNITY): Payer: Self-pay

## 2018-12-24 ENCOUNTER — Inpatient Hospital Stay (HOSPITAL_COMMUNITY): Payer: Medicare Other | Admitting: Occupational Therapy

## 2018-12-24 DIAGNOSIS — R4701 Aphasia: Secondary | ICD-10-CM

## 2018-12-24 DIAGNOSIS — I69391 Dysphagia following cerebral infarction: Secondary | ICD-10-CM

## 2018-12-24 NOTE — Progress Notes (Signed)
Occupational Therapy Session Note  Patient Details  Name: [name] MRN: [id number] Date of Birth: 04/22/1922  Today's Date: 12/24/2018 OT Individual Time: 1315-1345 OT Individual Time Calculation (min): 30 min    Short Term Goals: Week 3:  OT Short Term Goal 1 (Week 3): Continue working on downgraded OT goals set at min to max assist overall.  Skilled Therapeutic Interventions/Progress Updates:    Treatment session with focus on self-feeding, Rt attention, and functional mobility.  Pt received upright in w/c finishing lunch.  Pt able to visually scan and reach across midline to obtain roll on Rt side of body.  Therapist providing min cues for visual scanning during task to locate drink in Rt lower quadrant of visual field.  Pt yawning during session.  When asked if she would like to return to bed, she smiled.  Completed sit > stand in [place] with max assist.  Returned to bed via Stedy and then therapist lifted BLE in to bed while pt lowered upper body to bed.  Pt left semi-reclined with all needs in reach.  Therapy Documentation Precautions:  Precautions Precautions: Fall Restrictions Weight Bearing Restrictions: No General:   Vital Signs: Therapy Vitals Pulse Rate: (!) 59 Resp: 19 BP: (!) 130/52 Patient Position (if appropriate): Sitting Oxygen Therapy SpO2: 97 % O2 Device: Room Air Pain:  Pt with no c/o pain   Therapy/Group: Individual Therapy  Simonne Come 12/24/2018, 3:02 PM

## 2018-12-24 NOTE — Progress Notes (Signed)
[facility] PHYSICAL MEDICINE & REHABILITATION PROGRESS NOTE   Subjective/Complaints: Patient seen sitting up in bed this morning.  No reported issues overnight.  She is nonverbal this morning.  ROS: limited due to cognition   Objective:   No results found. No results for input(s): WBC, HGB, HCT, PLT in the last 72 hours. No results for input(s): NA, K, CL, CO2, GLUCOSE, BUN, CREATININE, CALCIUM in the last 72 hours.  Intake/Output Summary (Last 24 hours) at 12/24/2018 1130 Last data filed at 12/24/2018 0928 Gross per 24 hour  Intake 960 ml  Output -  Net 960 ml     Physical Exam: Vital Signs Blood pressure (!) 136/57, pulse 61, temperature 97.8 F (36.6 C), resp. rate 18, height 5\' 5"  (1.651 m), weight 65.2 kg, SpO2 93 %. Constitutional: No distress . Vital signs reviewed. HENT: Normocephalic.  Atraumatic. Eyes: EOMI. No discharge. Cardiovascular: Cardiac controlled. No JVD. Respiratory: CTA Bilaterally. Normal effort. GI: BS +. Non-distended. Musc: No edema or tenderness in extremities. Neurological: She isalert. Global aphasia Motor: Limited due to participation and apraxia, however moving all extremities  Skin: Warm and dry.  Intact.  Assessment/Plan: 1. Functional deficits secondary to left frontal infarct which require 3+ hours per day of interdisciplinary therapy in a comprehensive inpatient rehab setting.  Physiatrist is providing close team supervision and 24 hour management of active medical problems listed below.  Physiatrist and rehab team continue to assess barriers to discharge/monitor patient progress toward functional and medical goals  Care Tool:  Bathing  Bathing activity did not occur: Safety/medical concerns Body parts bathed by patient: Right arm, Left arm, Chest, Abdomen, Face   Body parts bathed by helper: Front perineal area, Buttocks Body parts n/a: Front perineal area, Buttocks   Bathing assist Assist Level: Moderate Assistance - Patient  50 - 74%     Upper Body Dressing/Undressing Upper body dressing   What is the patient wearing?: Pull over shirt    Upper body assist Assist Level: Moderate Assistance - Patient 50 - 74%    Lower Body Dressing/Undressing Lower body dressing      What is the patient wearing?: Incontinence brief, Pants     Lower body assist Assist for lower body dressing: Total Assistance - Patient < 25%     Toileting Toileting Toileting Activity did not occur Landscape architect and hygiene only): Refused  Toileting assist Assist for toileting: Maximal Assistance - Patient 25 - 49%     Transfers Chair/bed transfer  Transfers assist     Chair/bed transfer assist level: Dependent - mechanical lift     Locomotion Ambulation   Ambulation assist   Ambulation activity did not occur: Safety/medical concerns  Assist level: Moderate Assistance - Patient 50 - 74% Assistive device: Walker-rolling Max distance: 6'   Walk 10 feet activity   Assist  Walk 10 feet activity did not occur: Safety/medical concerns  Assist level: Moderate Assistance - Patient - 50 - 74% Assistive device: Walker-rolling   Walk 50 feet activity   Assist Walk 50 feet with 2 turns activity did not occur: Safety/medical concerns         Walk 150 feet activity   Assist Walk 150 feet activity did not occur: Safety/medical concerns         Walk 10 feet on uneven surface  activity   Assist Walk 10 feet on uneven surfaces activity did not occur: Safety/medical concerns         Wheelchair     Assist Will patient  use wheelchair at discharge?: Yes Type of Wheelchair: Manual Wheelchair activity did not occur: Safety/medical concerns  Wheelchair assist level: Minimal Assistance - Patient > 75% Max wheelchair distance: 128ft    Wheelchair 50 feet with 2 turns activity    Assist    Wheelchair 50 feet with 2 turns activity did not occur: Safety/medical concerns   Assist Level:  Minimal Assistance - Patient > 75%, Moderate Assistance - Patient 50 - 74%(mod A for turning)   Wheelchair 150 feet activity     Assist Wheelchair 150 feet activity did not occur: Safety/medical concerns   Assist Level: Minimal Assistance - Patient > 75%    Medical Problem List and Plan: 1.Functional declinesecondary toLeft frontal/ACA infarct.             Continue CIR, plan for SNF  Notes reviewed-stroke, labs reviewed 2. Antithrombotics: -DVT/anticoagulation:Pharmaceutical:Lovenox -antiplatelet therapy: Low dose ASA 3. Pain Management:Tylenol prn 4. Mood:LCSW to follow for evaluation and support. -antipsychotic agents: N/A 5. Neuropsych: This patientis not fullycapable of making decisions on onown behalf. 6. Skin/Wound Care:routine pressure relief measures. 7. Fluids/Electrolytes/Nutrition:encourage PO  D3 thins, advance diet as tolerated  Hypokalemia-supplementing KCL 69meq daily, trending up, labs ordered for tomorrow 8. HTN: Monitor BP bid. Continue Amlodipine and Cozaar daily Vitals:   12/23/18 1959 12/24/18 0408  BP: (!) 125/54 (!) 136/57  Pulse: 61 61  Resp: 16 18  Temp: 98.3 F (36.8 C) 97.8 F (36.6 C)  SpO2: 96% 93%   Controlled on 3/30 9. Dyslipidemia: On pravastatin.  10. 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   LOS: 17 days A FACE TO FACE EVALUATION WAS PERFORMED  Elby Blackwelder Lorie Phenix 12/24/2018, 11:30 AM

## 2018-12-24 NOTE — Progress Notes (Signed)
Occupational Therapy Session Note  Patient Details  Name: [name] MRN: [id number] Date of Birth: 28-Oct-1921  Today's Date: 12/24/2018 OT Individual Time: [phone] and 9767-3419 OT Individual Time Calculation (min): 60 min and 30 min and Today's Date: 12/24/2018 OT Missed Time: 15 Minutes Missed Time Reason: Nursing care   Short Term Goals: Week 3:  OT Short Term Goal 1 (Week 3): Continue working on downgraded OT goals set at min to max assist overall.  Skilled Therapeutic Interventions/Progress Updates:    1)Treatment session with focus on ADL retraining, dynamic sitting balance, sit > stand, and sequencing during self-care tasks.  Pt received upright in bed finishing breakfast with RN present administering meds, therefore pt missed initial 15 mins.  Engaged in LB bathing at bed level with therapist completing perineal hygiene at bed level and donning clean brief.  Pt initiated rolling Rt in bed with mod assist and min cues, required mod cues for initiation and sequencing when rolling to Lt.  Pt demonstrating posterior lean and lean to Rt when sitting EOB to engage in dressing task.  Therapist providing tactile cues for upright posture.  Pt attempted to thread BLE in to pant legs but unable to maintain sitting balance and reach towards feet, therefore therapist thread BLE.  Completed sit > stand with mod-max assist x2 with pt unable to maintain standing long enough for therapist to pull pants over hips, therefore utilized [employer].  Pt able to pull up in to [facility] with mod assist and maintained standing with UE support on Stedy bar while therapist pulled pants over hips.  Transferred to w/c via Stedy. Completed UB dressing with increased time for initiation and mod assist due to Rt inattention.  Engaged in oral care seated at sink with pt able to complete set up of tasks with increased time, but no cues provided this session.  Remained upright in w/c with seat belt alarm on and all needs in  reach.  2) Treatment session with focus on anterior weight shift, initiation, and Rt attention.  Pt with no c/o pain. Engaged in table top task in sitting to complete pattern replication.  Therapist instructed pt to remove pegs by specific color with pt able to complete without additional cues.  Required max faded to min cues when replicating pictured pattern, increased time for initiation.  Pt turned peg board when unable to complete task on Rt side, turning board 90* to allow for completion of task without having to scan to far Rt visual field - demonstrating good problem solving.  Pt returned to room and left upright in w/c with seat belt alarm on and all needs in reach.  Therapy Documentation Precautions:  Precautions Precautions: Fall Restrictions Weight Bearing Restrictions: No General: General OT Amount of Missed Time: 15 Minutes Pain:  Pt with no c/o pain   Therapy/Group: Individual Therapy  Simonne Come 12/24/2018, 9:59 AM

## 2018-12-24 NOTE — Plan of Care (Signed)
  Problem: Consults Goal: RH STROKE PATIENT EDUCATION Description See Patient Education module for education specifics  Outcome: Progressing   Problem: RH BOWEL ELIMINATION Goal: RH STG MANAGE BOWEL WITH ASSISTANCE Description STG Manage Bowel with Mod/ Max Assistance.  Outcome: Progressing   Problem: RH BLADDER ELIMINATION Goal: RH STG MANAGE BLADDER WITH ASSISTANCE Description STG Manage Bladder With Mod/ Max Assistance  Outcome: Progressing   Problem: RH SKIN INTEGRITY Goal: RH STG SKIN FREE OF INFECTION/BREAKDOWN Description No new breakdown with min assist   Outcome: Progressing   Problem: RH SAFETY Goal: RH STG ADHERE TO SAFETY PRECAUTIONS W/ASSISTANCE/DEVICE Description STG Adhere to Safety Precautions With Mod Assistance/Device.  Outcome: Progressing   Problem: RH COGNITION-NURSING Goal: RH STG USES MEMORY AIDS/STRATEGIES W/ASSIST TO PROBLEM SOLVE Description STG Uses Memory Aids/Strategies With Min Assistance to Problem Solve.  Outcome: Progressing   Problem: RH PAIN MANAGEMENT Goal: RH STG PAIN MANAGED AT OR BELOW PT'S PAIN GOAL Description < 3 out of 10.   Outcome: Progressing   

## 2018-12-24 NOTE — Progress Notes (Signed)
Physical Therapy Weekly Progress Note  Patient Details  Name: [name] MRN: [id number] Date of Birth: September 23, 1922  Beginning of progress report period: December 17, 2018 End of progress report period: December 24, 2018  Today's Date: 12/24/2018 PT Individual Time: 6962-9528 PT Individual Time Calculation (min): 75 min   Patient has met 1 of 3 short term goals.  Pt continues to be limited by poor postural awareness, impaired motor planning and decreased endurance affecting her ability to perform transfers and gait consistently. Pt's mobility fluctuates based on fatigue levels and pt's comfort with treating therapist. At times pt can require up to max assist for gait or transfers, she is most consistently requiring moderate assist.   Patient continues to demonstrate the following deficits muscle weakness, decreased coordination and decreased motor planning, decreased initiation and delayed processing and decreased standing balance, decreased postural control, hemiplegia and decreased balance strategies and therefore will continue to benefit from skilled PT intervention to increase functional independence with mobility.  Patient progressing toward long term goals..  Continue plan of care.  PT Short Term Goals Week 2:  PT Short Term Goal 1 (Week 2): Pt will ambulate at least 22f using LRAD with no more than mod A PT Short Term Goal 1 - Progress (Week 2): Progressing toward goal PT Short Term Goal 2 (Week 2): Pt will consistently perform sit<>stand transfers using LRAD with no more than mod A PT Short Term Goal 2 - Progress (Week 2): Met PT Short Term Goal 3 (Week 2): Pt will stand statically using LRAD for 2 minutes with no more than min A PT Short Term Goal 3 - Progress (Week 2): Progressing toward goal Week 3:  PT Short Term Goal 1 (Week 3): STG=LTG due to ELOS  Skilled Therapeutic Interventions/Progress Updates:    Pt supine in bed upon PT arrival, agreeable to therapy tx and denies  pain. Pt performed supine>sit with min assist and increased time, therapist providing cues for hand placement. Pt seated EOB with noted R lateral lean this session, therapist providing verbal and tactile cues for pt to correct/come to mideline. Pt performed x 2 sit<>stands from bed with RW and mod assist but unable to complete stand pivot transfer secondary to fatigue. Pt performed sit<>stand within stedy min assist from elevated bed. Pt standing within stedy at the sink using mirror for visual feedback worked on fixing R lateral lean>midline while in elevated perch position x 3 minutes. Pt performed x 5 sit<>stands from elevated stedy seat with CGA-min assist. Pt standing within stedy at the sink worked on L lateral weightshifting to reach for objects on L side. Pt transported to the gym with stedy. Pt worked on seated and standing balance within the stedy with mirror for visual feedback, session focused on maintaining midline and L lateral weightshift while performing various L sided reaching tasks. Manual facilitation throughout for L lateral weightshifting, trunk extension and verbal cues for midline with visual feedback of mirror. Pt performed x 2 sit<>stands this session from mat with RW and min assist, min assist for static standing balance and mod assist with dynamic standing balance. Pt sitting edge of mat worked on L lateral weightshifting while therapist used theraband to "pull" pt to the R, cueing pt "don't let me pull you," in order to overcorrect towards the L. Pt transferred to w/c with stedy and left seated in w/c in room at end of session, needs in reach.   Therapy Documentation Precautions:  Precautions Precautions: Fall Restrictions Weight  Bearing Restrictions: No   Therapy/Group: Individual Therapy  Netta Corrigan, PT, DPT 12/24/2018, 7:52 AM

## 2018-12-25 ENCOUNTER — Inpatient Hospital Stay (HOSPITAL_COMMUNITY): Payer: Medicare Other | Admitting: Occupational Therapy

## 2018-12-25 ENCOUNTER — Inpatient Hospital Stay (HOSPITAL_COMMUNITY): Payer: Self-pay | Admitting: Physical Therapy

## 2018-12-25 ENCOUNTER — Inpatient Hospital Stay (HOSPITAL_COMMUNITY): Payer: Medicare Other | Admitting: Physical Therapy

## 2018-12-25 ENCOUNTER — Inpatient Hospital Stay (HOSPITAL_COMMUNITY): Payer: Self-pay

## 2018-12-25 ENCOUNTER — Inpatient Hospital Stay (HOSPITAL_COMMUNITY): Payer: Medicare Other

## 2018-12-25 LAB — BASIC METABOLIC PANEL
ANION GAP: 11 (ref 5–15)
BUN: 23 mg/dL (ref 8–23)
CO2: 23 mmol/L (ref 22–32)
Calcium: 8.7 mg/dL — ABNORMAL LOW (ref 8.9–10.3)
Chloride: 104 mmol/L (ref 98–111)
Creatinine, Ser: 0.89 mg/dL (ref 0.44–1.00)
GFR calc Af Amer: >60 mL/min (ref >60)
GFR calc non Af Amer: 55 mL/min — ABNORMAL LOW (ref >60)
Glucose, Bld: 102 mg/dL — ABNORMAL HIGH (ref 70–99)
Potassium: 3.7 mmol/L (ref 3.5–5.1)
Sodium: 138 mmol/L (ref 135–145)

## 2018-12-25 NOTE — Progress Notes (Signed)
Physical Therapy Discharge Summary  Patient Details  Name: [name] MRN: [id number] Date of Birth: 01/20/1922 5Patient has met 5 of 5 long term goals due to improved activity tolerance, improved balance, improved postural control, increased strength and improved attention.  Patient to discharge at a wheelchair level [place].   Patient's care partner unavailable to provide the necessary physical assistance at discharge. Thus patient will required continued care at a skilled nursing facility.   Reasons goals not met: Pt did not meet her gait or transfer goals as she continues to require moderate assist for transfers secondary to fatigue, apraxia and weakness. Pt is ambulating at a mod assist level with RW but has not been able to ambulate 50 ft secondary to fatigue and weakness.   Recommendation:  Patient will benefit from ongoing skilled PT services in skilled nursing facility setting to continue to advance safe functional mobility, address ongoing impairments in balance, strength, postural control, motor planning, and minimize fall risk.  Equipment: No equipment provided  Reasons for discharge: treatment goals met and discharge from hospital  Patient/family agrees with progress made and goals achieved: Yes  PT Discharge Precautions/Restrictions Precautions Precautions: Fall Restrictions Weight Bearing Restrictions: No Vital Signs Therapy Vitals Temp: 98.3 F (36.8 C) Pulse Rate: 62 Resp: 18 BP: (!) 128/54 Patient Position (if appropriate): Sitting Oxygen Therapy SpO2: 99 % O2 Device: Room Air Cognition Overall Cognitive Status: History of cognitive impairments - at baseline Arousal/Alertness: Awake/alert Orientation Level: Oriented to person Attention: Selective Selective Attention: Appears intact Safety/Judgment: Impaired Sensation Sensation Light Touch: Appears Intact Additional Comments: sensation grossly intact B LEs, unable to formally assess secondary  to cognitive deficits.  Coordination Gross Motor Movements are Fluid and Coordinated: No Fine Motor Movements are Fluid and Coordinated: No Coordination and Movement Description: impaired coordination, slow Motor  Motor Motor: Hemiplegia;Motor apraxia Motor - Discharge Observations: R hemiparesis, motor apraxia  Mobility Bed Mobility Bed Mobility: Rolling Right;Rolling Left;Supine to Sit;Sit to Supine Rolling Right: Minimal Assistance - Patient > 75% Rolling Left: Minimal Assistance - Patient > 75% Supine to Sit: Minimal Assistance - Patient > 75% Sit to Supine: Minimal Assistance - Patient > 75% Transfers Transfers: Stand to [employer];Sit to Stand Sit to Stand: Moderate Assistance - Patient 50-74%;Minimal Assistance - Patient > 75% Stand to Sit: Minimal Assistance - Patient > 75% Stand Pivot Transfers: Moderate Assistance - Patient 50 - 74% Stand Pivot Transfer Details: Manual facilitation for weight shifting;Manual facilitation for weight bearing;Verbal cues for safe use of DME/AE;Verbal cues for precautions/safety;Verbal cues for technique;Visual cues/gestures for sequencing Transfer (Assistive device): Rolling walker Locomotion  Gait Ambulation: Yes Gait Assistance: Moderate Assistance - Patient 50-74% Gait Distance (Feet): 16 Feet Assistive device: Rolling walker Gait Assistance Details: Tactile cues for initiation;Verbal cues for sequencing;Verbal cues for precautions/safety;Visual cues/gestures for sequencing;Verbal cues for technique;Verbal cues for gait pattern;Verbal cues for safe use of DME/AE;Manual facilitation for weight shifting;Manual facilitation for weight bearing Gait Gait: Yes Gait Pattern: Impaired Gait Pattern: Decreased stance time - left;Decreased step length - right;Step-to pattern;Decreased weight shift to left;Lateral trunk lean to right;Trunk flexed Gait velocity: decreased Stairs / Additional Locomotion Stairs: No Engineer, manufacturing: Yes Wheelchair Assistance: Chartered loss adjuster: Both upper extremities Wheelchair Parts Management: Needs assistance Distance: 100 ft  Trunk/Postural Assessment  Cervical Assessment Cervical Assessment: Exceptions to WFL(forward head posture) Thoracic Assessment Thoracic Assessment: Exceptions to WFL(rounded shoulders, kyphosis) Lumbar Assessment Lumbar Assessment: Exceptions to WFL(posterior pelvic tilt) Postural Control Postural Control: Deficits on  evaluation Trunk Control: R lateral lean Protective Responses: impaired  Balance Balance Balance Assessed: Yes Static Sitting Balance Static Sitting - Level of Assistance: 5: Stand by assistance Dynamic Sitting Balance Dynamic Sitting - Level of Assistance: 5: Stand by assistance Static Standing Balance Static Standing - Level of Assistance: 4: Min assist(with RW) Dynamic Standing Balance Dynamic Standing - Level of Assistance: 3: Mod assist(with RW) Extremity Assessment  RLE Assessment RLE Assessment: Exceptions to [facility] Passive Range of Motion (PROM) Comments: [facility] General Strength Comments: unable to formally assess 2/2 aphasia/apraxia, pt moving extremity against gravity, noticable weakness in stance compared to L side LLE Assessment LLE Assessment: Within Functional Limits General Strength Comments: grossly 4/5 throughout    Netta Corrigan, PT, DPT 12/25/2018, 4:58 PM

## 2018-12-25 NOTE — Discharge Summary (Signed)
Physician Discharge Summary  Patient ID: [name] MRN: [id number] DOB/AGE: [birth year] 85 y.o.  Admit date: 12/07/2018 Discharge date: 12/26/2018  Discharge Diagnoses:  Principal Problem:   Acute right ACA stroke [facility]) Active Problems:   Dementia ([place])   Essential hypertension   Impaired glucose tolerance   Atrial fibrillation (HCC)   Dysphagia, post-stroke   Global aphasia   Discharged Condition: stable   Significant Diagnostic Studies: Ct Head Wo Contrast  Result Date: 12/25/2018 CLINICAL DATA:  Stroke, follow-up EXAM: CT HEAD WITHOUT CONTRAST TECHNIQUE: Contiguous axial images were obtained from the base of the skull through the vertex without intravenous contrast. COMPARISON:  12/04/2018 FINDINGS: Brain: Large area of encephalomalacia again noted in the left cerebral hemisphere, stable since prior study. Low-density noted in the anterior left frontal lobe compatible with evolving acute to subacute left frontal infarct. Old bilateral basal ganglia lacunar infarcts. Chronic small vessel disease throughout the deep white matter. No new area of infarction. No hemorrhage. Vascular: Aneurysm clips in the region of the left MCA bifurcation. Moderate calcifications. Skull: Rib prior left temporoparietal craniotomy. No acute findings. Sinuses/Orbits: Visualized paranasal sinuses and mastoids clear. Orbital soft tissues unremarkable. Other: None IMPRESSION: Evolutionary changes in the anterior left frontal infarct. No new areas of infarct or hemorrhage. Old left MCA territory infarct with encephalomalacia. Electronically Signed   By: Rolm Baptise M.D.   On: 12/25/2018 11:54    Labs:  Basic Metabolic Panel: BMP Latest Ref Rng & Units 12/25/2018 12/19/2018 12/12/2018  Glucose 70 - 99 mg/dL 102(H) 96 117(H)  BUN 8 - 23 mg/dL 23 21 22   Creatinine 0.44 - 1.00 mg/dL 0.89 0.84 0.78  Sodium 135 - 145 mmol/L 138 138 138  Potassium 3.5 - 5.1 mmol/L 3.7 4.0 3.7  Chloride 98 - 111 mmol/L 104 105  103  CO2 22 - 32 mmol/L 23 20(L) 28  Calcium 8.9 - 10.3 mg/dL 8.7(L) 8.5(L) 8.5(L)    CBC: CBC Latest Ref Rng & Units 12/26/2018 12/19/2018 12/12/2018  WBC 4.0 - 10.5 K/uL 5.3 5.1 7.8  Hemoglobin 12.0 - 15.0 g/dL 12.4 13.1 13.6  Hematocrit 36.0 - 46.0 % 36.7 38.0 41.3  Platelets 150 - 400 K/uL 390 419(H) 328    CBG: No results for input(s): GLUCAP in the last 168 hours.  Brief HPI:   [name] is a 85 year old RH female with history of stroke with mild aphasia, aneurysm clipping 1993, ocular stroke 2005, AVB s/p PPM, mild dementia who was admitted on 12/03/2018 after being found with right-sided weakness and aphasia.  CTA head/neck showed emergently left A2 occlusion with focal left cryoablation and severe left and moderate right supraclinoid ICA stenosis, occluded left MCA aneurysm clip with intermediate collaterals and 3.9 cm ascending aortic aneurysm.  She received IV TPA and Dr. Rigoberto Noel felt the stroke was embolic due to A. fib.  Follow-up CT head showed evolving acute left frontal/ACA territory infarct with new petechial hemorrhage and recommendations to repeat CT in a week to decide on [place] based on CT findings.  Patient has had issues with mild respiratory distress due to fluid overload therefore IV fluids DC'd.  She continues to be limited right dysphasia with mixed disfluent aphasia, perseverative motor responses, as well as right-sided weakness.  Therapy evaluations revealed functional decline and CIR was recommended for follow-up therapy   Hospital Course: [name] was admitted to rehab 12/07/2018 for inpatient therapies to consist of PT, ST and OT at least three hours five days  a week. Past admission physiatrist, therapy team and rehab RN have worked together to provide customized collaborative inpatient rehab. Mood has been stable without signs of agitation. Her blood pressures have been monitored on bid basis and are reasonably controlled on Amlodipine and Cozaar. Diet has  been advanced to dysphagia 3 and she is tolerating this without S/S of aspiration. Hypokalemia noted at admission and she continues on supplement at this time. Recommend follow up BMET in 1-2 weeks to decide if supplement is still needed.    Hear rate is controlled and follow-up CT of head done on 3/31 showing no new changes/no hemorrhages. Films reviewed by Dr. Leonie Man and patient cleared to start [place].  She is occasionally incontinent of bladder and recommend toileting schedule to help with continence. She has made some gains during her rehab stay but continues requires moderate assist for basic ADL tasks, increased time with cues to complete basic tasks as well as unable to stand without STEADY lift. Family has elected on SNF for progressive therapy. Bed available at [facility] and patient was discharged on 12/26/18    Rehab course: During patient's stay in rehab weekly team conferences were held to monitor patient's progress, set goals and discuss barriers to discharge. At admission, patient required max to total assist with ADL tasks and total assist with mobility. She exhibited dysfluent speech at word level with 50% accuracy with errors and required max assist to break perseverative patterns. She required increased time for processing, was unable to answer Y/N questions accurately and dysphagia 1 diet continued due to respiratory/cardiac issues.  She  has had improvement in activity tolerance, balance, postural control as well as ability to compensate for deficits.   She has had improvement in functional use RUE  and RLE  as well as improvement in awareness. She requires mod instructional cueing with mod assist for all bathing while seated.  She requires max assist with donning briefs and pants.  She continues to demonstrate significant deficits in balance standing and requires moderate assist overall for basic self-care tasks.  She requires mod assist for bed mobility, total assist with steady for  transfers as she continues to demonstrate significant left lateral lean.  She requires mechanical lift for transfers and is able to ambulate 6' with mod to max +2 assist. She requires increased time to initiate and cues for sequencing.  She is showing improvement in sitting balance at the edge of bed. She is tolerating dysphagia 3 diet with intermittent supervision. She is able to express basic needs/wants and utter familiar phrases without preservation. She is able to follow one step commands with min verbal and contextual cues. She is currently at baseline and ST signed off by 12/20/18.     Disposition:  [facility].   Diet: Dysphagia 3, thins. Needs intermittent supervision for safety.   Special Instructions: 1. Recheck CBC/BMET in one week. 2. Will need annual imaging --CTA or MRA to monitor ascending aortic aneurysm.    Discharge Instructions    Ambulatory referral to Physical Medicine Rehab   Complete by:  As directed    4 weeks follow up appt     Allergies as of 12/26/2018      Reactions   Codeine    unknown      Medication List    STOP taking these medications   ALPRAZolam 0.25 MG tablet Commonly known as:  XANAX   Artificial Tears 1.4 % ophthalmic solution Generic drug:  polyvinyl alcohol   clopidogrel  75 MG tablet Commonly known as:  PLAVIX   DSS 100 MG Caps     TAKE these medications   amLODipine 10 MG tablet Commonly known as:  NORVASC TAKE 1 TABLET BY MOUTH DAILY. RESUME IN 3 DAYS IS SYSTOLIC BLOOD PRESSURE IS GREATER THAN 150. What changed:  See the new instructions.   apixaban 5 MG Tabs tablet Commonly known as:  Eliquis Take 1 tablet (5 mg total) by mouth 2 (two) times daily.   Caltrate 600+D 600-400 MG-UNIT tablet Generic drug:  Calcium Carbonate-Vitamin D Take 1 tablet by mouth at bedtime.   donepezil 10 MG tablet Commonly known as:  ARICEPT TAKE 1 TABLET(10 MG) BY MOUTH DAILY What changed:  See the new instructions.   feeding  supplement (ENSURE COMPLETE) Liqd Take 237 mLs by mouth 2 (two) times daily between meals.   levothyroxine 125 MCG tablet Commonly known as:  SYNTHROID, LEVOTHROID TAKE 1 TABLET(125 MCG) BY MOUTH DAILY What changed:  See the new instructions.   losartan 100 MG tablet Commonly known as:  COZAAR TAKE 1 TABLET(100 MG) BY MOUTH DAILY What changed:  See the new instructions.   multivitamin with minerals Tabs tablet Take 1 tablet by mouth every morning. Centrum silver   pantoprazole sodium 40 mg/20 mL Pack Commonly known as:  PROTONIX Take 20 mLs (40 mg total) by mouth at bedtime.   polyethylene glycol packet Commonly known as:  MIRALAX / GLYCOLAX Take 17 g by mouth daily as needed for mild constipation.   potassium chloride 20 MEQ/15ML (10%) Soln Take 15 mLs (20 mEq total) by mouth daily.   pravastatin 40 MG tablet Commonly known as:  PRAVACHOL TAKE 1 TABLET(40 MG) BY MOUTH DAILY What changed:  See the new instructions.      Follow-up Information    Kirsteins, Luanna Salk, MD Follow up.   Specialty:  Physical Medicine and Rehabilitation Why:  Office will call you with follow up appointment Contact information: [place] [address] [phone]        [facility] Follow up.   Why:  Call for follow up appointment in 4 weeks.  Contact information: [address]     [address] [place] [address] [phone]       Biagio Borg, MD Follow up.   Specialties:  Internal Medicine, Radiology Why:  Needs post hospital follow up as well as  Contact information: [place] [address] [phone]           Signed: Bary Leriche 12/26/2018, 9:31 AM

## 2018-12-25 NOTE — Plan of Care (Signed)
  Problem: Consults Goal: RH STROKE PATIENT EDUCATION Description See Patient Education module for education specifics  Outcome: Progressing   Problem: RH BOWEL ELIMINATION Goal: RH STG MANAGE BOWEL WITH ASSISTANCE Description STG Manage Bowel with Mod/ Max Assistance.  Outcome: Progressing   Problem: RH BLADDER ELIMINATION Goal: RH STG MANAGE BLADDER WITH ASSISTANCE Description STG Manage Bladder With Mod/ Max Assistance  Outcome: Progressing   Problem: RH SKIN INTEGRITY Goal: RH STG SKIN FREE OF INFECTION/BREAKDOWN Description No new breakdown with min assist   Outcome: Progressing   Problem: RH SAFETY Goal: RH STG ADHERE TO SAFETY PRECAUTIONS W/ASSISTANCE/DEVICE Description STG Adhere to Safety Precautions With Mod Assistance/Device.  Outcome: Progressing   Problem: RH COGNITION-NURSING Goal: RH STG USES MEMORY AIDS/STRATEGIES W/ASSIST TO PROBLEM SOLVE Description STG Uses Memory Aids/Strategies With Min Assistance to Problem Solve.  Outcome: Progressing   Problem: RH PAIN MANAGEMENT Goal: RH STG PAIN MANAGED AT OR BELOW PT'S PAIN GOAL Description < 3 out of 10.   Outcome: Progressing

## 2018-12-25 NOTE — Progress Notes (Signed)
Physical Therapy Session Note  Patient Details  Name: [name] MRN: [id number] Date of Birth: 05/10/1922  Today's Date: 12/25/2018 PT Individual Time: 1445-1545 PT Individual Time Calculation (min): 60 min   Short Term Goals: Week 3:  PT Short Term Goal 1 (Week 3): STG=LTG due to ELOS  Skilled Therapeutic Interventions/Progress Updates:    Pt seated in w/c upon PT arrival, agreeable to therapy tx and denies pain. Pt transported to the gym. Pt set up for transfer to mat. Pt performed stand pivot to the mat with RW and mod assist, increased time to perform secondary to delayed motor planning and processing. Pt maintains improved midline orientation this session while sitting edge of mat, supervision for sitting balance with min cues for correction of lean. Pt performed sit<>stands x 4 throughout this session from mat with RW and min assist, improved midline orientation with use of mirror for visual feedback and verbal cues for upright posture. In standing therapist provided resistance around the pelvis using orange theraband while pt performed L lateral weightshift "pushing into the band," min assist for standing balance with RW, x2 trials. Pt standing with RW attempted to worked on R LE stepping in place to taped target on floor, pt resisting L lateral weightshift and unable to clear R foot. Pt ambulated x 16 ft this session with RW and mod assist, therapist providing L lateral weightshift in order to clear R LE during swing. Pt transported back to room and transferred to recliner with mod assist and RW. Pt left seated in recliner with needs in reach and chair alarm set.   Therapy Documentation Precautions:  Precautions Precautions: Fall Restrictions Weight Bearing Restrictions: No   Therapy/Group: Individual Therapy  Netta Corrigan, PT, DPT 12/25/2018, 7:51 AM

## 2018-12-25 NOTE — Progress Notes (Signed)
Occupational Therapy Discharge Summary  Patient Details  Name: [name] MRN: [id number] Date of Birth: 05-Mar-1922  Today's Date: 12/25/2018 OT Individual Time: 3825-0539 OT Individual Time Calculation (min): 73 min    Session Note:  Pt completed shower and dressing during session as well as toileting.  Mod assist for supine to sit EOB with use of the Stedy and total assist for transfer to the [facility].  She needed max assist for toilet hygiene and then completed transfer over to the tub bench for bathing.  Mod instructional cueing with mod assist for all bathing in sitting.  [name] was utilized for transfer to the wheelchair in order to work on dressing.  Mod assist for donning bra and pullover after they were oriented.  Max assist for donning brief, pants, and gripper socks.  She was able to complete grooming task of brushing her hair to conclude session.  Pt left in the wheelchair with call button and phone in reach and safety alarm belt in place.    Patient has met 11 of 11 long term goals due to improved activity tolerance, improved balance, ability to compensate for deficits and improved coordination.  Patient to discharge at overall Mod Assist level.  Patient's care partner unavailable to provide the necessary physical and cognitive assistance at discharge.    Reasons goals not met: NA  Recommendation:  Patient will benefit from ongoing skilled OT services in skilled nursing facility setting to continue to advance functional skills in the area of BADL and Reduce care partner burden.  Pt still demonstrates significant deficits in balance, standing, and functional performance of selfcare tasks such as bathing, dressing, and toileting.  Feel she will benefit from 24 hr assist at SNF level as well as continued OT to progress to a min assist level or better for possible discharge to ALF.    Equipment: No equipment provided  Reasons for discharge: treatment goals met and discharge from  hospital  Patient/family agrees with progress made and goals achieved: Yes  OT Discharge Precautions/Restrictions  Precautions Precautions: Fall Precaution Comments: expressive deficits Restrictions Weight Bearing Restrictions: No  Pain Pain Assessment Pain Scale: Faces Pain Score: 0-No pain ADL ADL Eating: Set up Where Assessed-Eating: Wheelchair Grooming: Setup Where Assessed-Grooming: Wheelchair Upper Body Bathing: Setup Where Assessed-Upper Body Bathing: Sitting at sink, Wheelchair Lower Body Bathing: Moderate assistance Where Assessed-Lower Body Bathing: Shower Upper Body Dressing: Moderate assistance Where Assessed-Upper Body Dressing: Wheelchair Lower Body Dressing: Maximal assistance Where Assessed-Lower Body Dressing: Wheelchair Toileting: Maximal assistance Where Assessed-Toileting: Glass blower/designer: Moderate assistance Toilet Transfer Method: Stand pivot Science writer: Geophysical data processor: Moderate assistance Social research officer, government Method: Radiographer, therapeutic: Gaffer Baseline Vision/History: Wears glasses Wears Glasses: At all times Ocular Range of Motion: Within Functional Limits Alignment/Gaze Preference: Within Defined Limits Visual Fields: No apparent deficits Perception  Perception: Impaired Inattention/Neglect: Does not attend to right side of body Praxis Praxis: Impaired Praxis Impairment Details: Initiation;Motor planning Cognition Overall Cognitive Status: History of cognitive impairments - at baseline Arousal/Alertness: Awake/alert Orientation Level: Oriented to person Attention: Alternating Focused Attention: Appears intact Selective Attention: Appears intact Alternating Attention: Impaired Memory: Appears intact Awareness: Impaired Problem Solving: Impaired Safety/Judgment: Impaired Sensation Sensation Light Touch: Appears Intact Hot/Cold: Not  tested Proprioception: Not tested Stereognosis: Not tested Additional Comments: sensation grossly intact B LEs, unable to formally assess secondary to cognitive deficits.  Coordination Gross Motor Movements are Fluid and Coordinated: No Fine Motor Movements are Fluid  and Coordinated: No Coordination and Movement Description: Pt uses the RUE at a diminshed level with selfcare tasks and dressing tasks with increased time.  Decreased motor planning noted. Motor  Motor Motor: Hemiplegia;Motor apraxia Motor - Discharge Observations: R hemiparesis, motor apraxia Mobility  Bed Mobility Bed Mobility: Rolling Left;Supine to Sit Rolling Right: Minimal Assistance - Patient > 75% Rolling Left: Moderate Assistance - Patient 50-74% Supine to Sit: Moderate Assistance - Patient 50-74% Sit to Supine: Minimal Assistance - Patient > 75% Transfers Sit to Stand: Moderate Assistance - Patient 50-74%;Minimal Assistance - Patient > 75% Stand to Sit: Minimal Assistance - Patient > 75%  Trunk/Postural Assessment  Cervical Assessment Cervical Assessment: Exceptions to WFL(forward head posture) Thoracic Assessment Thoracic Assessment: Exceptions to WFL(rounded shoulders, kyphosis) Lumbar Assessment Lumbar Assessment: Exceptions to WFL(posterior pelvic tilt) Postural Control Postural Control: Deficits on evaluation Trunk Control: R lateral lean Protective Responses: impaired  Balance Balance Balance Assessed: Yes Static Sitting Balance Static Sitting - Balance Support: Feet supported;No upper extremity supported Static Sitting - Level of Assistance: 5: Stand by assistance Dynamic Sitting Balance Dynamic Sitting - Balance Support: During functional activity;No upper extremity supported Dynamic Sitting - Level of Assistance: 5: Stand by assistance Static Standing Balance Static Standing - Level of Assistance: 3: Mod assist Dynamic Standing Balance Dynamic Standing - Level of Assistance: 2: Max  assist Extremity/Trunk Assessment RUE Assessment RUE Assessment: Exceptions to [employer] Passive Range of Motion (PROM) Comments: WFL General Strength Comments: Pt with isolated movement in all joints.  Decreased speed and coordination noted with functional use at a diminshed level. LUE Assessment LUE Assessment: Within Functional Limits General Strength Comments: grossly 4+/5   Anayla Giannetti OTR/L 12/25/2018, 5:15 PM

## 2018-12-25 NOTE — Progress Notes (Signed)
[facility] PHYSICAL MEDICINE & REHABILITATION PROGRESS NOTE   Subjective/Complaints: Patient seen laying in bed this morning.  No reported issues overnight.  She remains aphasic.  ROS: limited due to cognition   Objective:   No results found. No results for input(s): WBC, HGB, HCT, PLT in the last 72 hours. Recent Labs    12/25/18 0714  NA 138  K 3.7  CL 104  CO2 23  GLUCOSE 102*  BUN 23  CREATININE 0.89  CALCIUM 8.7*    Intake/Output Summary (Last 24 hours) at 12/25/2018 4132 Last data filed at 12/24/2018 1854 Gross per 24 hour  Intake 360 ml  Output -  Net 360 ml     Physical Exam: Vital Signs Blood pressure (!) 139/55, pulse 86, temperature 97.9 F (36.6 C), temperature source Oral, resp. rate 14, height 5\' 5"  (1.651 m), weight 67.5 kg, SpO2 99 %. Constitutional: No distress . Vital signs reviewed. HENT: Normocephalic.  Atraumatic. Eyes: EOMI. No discharge. Cardiovascular: Cardiac controlled.  No JVD. Respiratory: CTA bilaterally.  Normal effort. GI: BS +. Non-distended. Musc: No edema or tenderness in extremities. Neurological: She isalert. Global aphasia Motor: Limited due to participation and apraxia, however moving all extremities, stable Skin: Warm and dry.  Intact.  Assessment/Plan: 1. Functional deficits secondary to left frontal infarct which require 3+ hours per day of interdisciplinary therapy in a comprehensive inpatient rehab setting.  Physiatrist is providing close team supervision and 24 hour management of active medical problems listed below.  Physiatrist and rehab team continue to assess barriers to discharge/monitor patient progress toward functional and medical goals  Care Tool:  Bathing  Bathing activity did not occur: Safety/medical concerns Body parts bathed by patient: Right arm, Left arm, Chest, Abdomen, Face   Body parts bathed by helper: Front perineal area, Buttocks Body parts n/a: Front perineal area, Buttocks   Bathing  assist Assist Level: Moderate Assistance - Patient 50 - 74%     Upper Body Dressing/Undressing Upper body dressing   What is the patient wearing?: Pull over shirt    Upper body assist Assist Level: Moderate Assistance - Patient 50 - 74%    Lower Body Dressing/Undressing Lower body dressing      What is the patient wearing?: Incontinence brief, Pants     Lower body assist Assist for lower body dressing: Total Assistance - Patient < 25%     Toileting Toileting Toileting Activity did not occur Landscape architect and hygiene only): Refused  Toileting assist Assist for toileting: Maximal Assistance - Patient 25 - 49%     Transfers Chair/bed transfer  Transfers assist     Chair/bed transfer assist level: Dependent - mechanical lift     Locomotion Ambulation   Ambulation assist   Ambulation activity did not occur: Safety/medical concerns  Assist level: Moderate Assistance - Patient 50 - 74% Assistive device: Walker-rolling Max distance: 6'   Walk 10 feet activity   Assist  Walk 10 feet activity did not occur: Safety/medical concerns  Assist level: Moderate Assistance - Patient - 50 - 74% Assistive device: Walker-rolling   Walk 50 feet activity   Assist Walk 50 feet with 2 turns activity did not occur: Safety/medical concerns         Walk 150 feet activity   Assist Walk 150 feet activity did not occur: Safety/medical concerns         Walk 10 feet on uneven surface  activity   Assist Walk 10 feet on uneven surfaces activity did not occur:  Safety/medical concerns         Wheelchair     Assist Will patient use wheelchair at discharge?: Yes Type of Wheelchair: Manual Wheelchair activity did not occur: Safety/medical concerns  Wheelchair assist level: Minimal Assistance - Patient > 75% Max wheelchair distance: 176ft    Wheelchair 50 feet with 2 turns activity    Assist    Wheelchair 50 feet with 2 turns activity did not  occur: Safety/medical concerns   Assist Level: Minimal Assistance - Patient > 75%, Moderate Assistance - Patient 50 - 74%(mod A for turning)   Wheelchair 150 feet activity     Assist Wheelchair 150 feet activity did not occur: Safety/medical concerns   Assist Level: Minimal Assistance - Patient > 75%    Medical Problem List and Plan: 1.Functional declinesecondary toLeft frontal/ACA infarct.             Continue CIR, plan for SNF-possibly tomorrow 2. Antithrombotics: -DVT/anticoagulation:Pharmaceutical:Lovenox -antiplatelet therapy: Low dose ASA 3. Pain Management:Tylenol prn 4. Mood:LCSW to follow for evaluation and support. -antipsychotic agents: N/A 5. Neuropsych: This patientis not fullycapable of making decisions on onown behalf. 6. Skin/Wound Care:routine pressure relief measures. 7. Fluids/Electrolytes/Nutrition:encourage PO  D3 thins, advance diet as tolerated  Hypokalemia-supplementing KCL 53meq daily, potassium 3.7 on 3/31 8. HTN: Monitor BP bid. Continue Amlodipine and Cozaar daily Vitals:   12/24/18 1338 12/25/18 0429  BP: (!) 130/52 (!) 139/55  Pulse: (!) 59 86  Resp: 19 14  Temp:  97.9 F (36.6 C)  SpO2: 97% 99%   Controlled on 3/31 9. Dyslipidemia: On pravastatin.  10. 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   LOS: 18 days A FACE TO FACE EVALUATION WAS PERFORMED   Lorie Phenix 12/25/2018, 9:38 AM

## 2018-12-25 NOTE — Progress Notes (Signed)
Physical Therapy Session Note  Patient Details  Name: [name] MRN: [id number] Date of Birth: 08/23/1922  Today's Date: 12/25/2018 PT Individual Time: 0922-1030 and 1111 - 1119 PT Individual Time Calculation (min): 68 min and 8 minutes  Short Term Goals: Week 3:  PT Short Term Goal 1 (Week 3): STG=LTG due to ELOS  Skilled Therapeutic Interventions/Progress Updates:    Session 1: Pt received supine in bed finishing eating breakfast and agreeable to therapy session - therapist inquired about toileting and pt reporting needing to use bathroom. Pt performed supine to sit, HOB elevated and using bed rails, with significantly increased time, multimodal cuing for sequencing and task initiation - mod assist for R LE management, trunk upright and pivoting hips to EOB. Pt attempted sit to stand from EOB to RW with max assist for lifting with significantly increased time for pt initiation and multimodal cuing with pt unable to achieve full standing. Pt performed sit to stand from EOB to stedy using B UE support and continuing to require significantly increased time to initiate task with pt reporting increased fatigue this session - pt required max assist for lifting to get seated in stedy. Pt transported to bathroom in stedy with 1 person assist with pt demonstrating ability to sustain midline orientation and good trunk control during transport. Pt performed sit<>stand from stedy seat to toilet with max assist for lifting and for LB clothing management. Pt noted to be incontinent of bladder in brief and continent of bladder while on toilet. Pt attempted sit to stand multiple times from toilet to stedy with max assist of 1 eventually requiring max assist of 2 to achieve full lifting of hips to allow pericare and placement of stedy seat. Pt transported back to bed in stedy demonstrating significant L lateral lean during transfer requiring +2 assist for pt safety and min assist for trunk control to prevent  lean. Pt performed sit<>standy from stedy seat to EOB with max assist of 2 for lifting. Pt performed sit to supine with mod assist for B LE management with increased time. Pt performed rolling R and L with mod assist for full rolling and cuing for sequencing of B LE positioning for improved roll. Pt left supine in bed with needs in reach and bed alarm on.    Session 2: Pt received supine in bed and agreeable to therapy session despite increased fatigue. Pt performed supine to sit, HOB partially elevated and using bedrails, with mod assist for trunk upright and pivoting hips with increased time for initiation and cuing for sequencing. Pt demonstrated improved midline orientation in sitting on EOB. Imaging technician arrived to take patient to CT scan. Therapist assisted pt return sit to supine with mod to max assist for B LE management. Pt left supine in bed with technician present to assume care of patient.   Therapy Documentation Precautions:  Precautions Precautions: Fall Restrictions Weight Bearing Restrictions: No  Pain: During 1st session: Pt reported pain in R LE upon moving LE to initiate supine to sit bed mobility. Rest breaks provided and repositioning for improved pt comfort.   Therapy/Group: Individual Therapy  Tawana Scale, PT, DPT 12/25/2018, 7:52 AM

## 2018-12-26 ENCOUNTER — Inpatient Hospital Stay (HOSPITAL_COMMUNITY): Payer: Self-pay | Admitting: Physical Therapy

## 2018-12-26 ENCOUNTER — Inpatient Hospital Stay (HOSPITAL_COMMUNITY): Payer: Self-pay

## 2018-12-26 DIAGNOSIS — D649 Anemia, unspecified: Secondary | ICD-10-CM | POA: Diagnosis not present

## 2018-12-26 DIAGNOSIS — I10 Essential (primary) hypertension: Secondary | ICD-10-CM | POA: Diagnosis not present

## 2018-12-26 DIAGNOSIS — I69320 Aphasia following cerebral infarction: Secondary | ICD-10-CM | POA: Diagnosis not present

## 2018-12-26 DIAGNOSIS — R0989 Other specified symptoms and signs involving the circulatory and respiratory systems: Secondary | ICD-10-CM | POA: Diagnosis not present

## 2018-12-26 DIAGNOSIS — I63111 Cerebral infarction due to embolism of right vertebral artery: Secondary | ICD-10-CM | POA: Diagnosis not present

## 2018-12-26 DIAGNOSIS — R062 Wheezing: Secondary | ICD-10-CM | POA: Diagnosis not present

## 2018-12-26 DIAGNOSIS — R05 Cough: Secondary | ICD-10-CM | POA: Diagnosis not present

## 2018-12-26 DIAGNOSIS — I639 Cerebral infarction, unspecified: Secondary | ICD-10-CM | POA: Diagnosis not present

## 2018-12-26 DIAGNOSIS — F419 Anxiety disorder, unspecified: Secondary | ICD-10-CM | POA: Diagnosis not present

## 2018-12-26 DIAGNOSIS — R279 Unspecified lack of coordination: Secondary | ICD-10-CM | POA: Diagnosis not present

## 2018-12-26 DIAGNOSIS — F0150 Vascular dementia without behavioral disturbance: Secondary | ICD-10-CM | POA: Diagnosis not present

## 2018-12-26 DIAGNOSIS — F0390 Unspecified dementia without behavioral disturbance: Secondary | ICD-10-CM | POA: Diagnosis not present

## 2018-12-26 DIAGNOSIS — R41841 Cognitive communication deficit: Secondary | ICD-10-CM | POA: Diagnosis not present

## 2018-12-26 DIAGNOSIS — R0902 Hypoxemia: Secondary | ICD-10-CM | POA: Diagnosis not present

## 2018-12-26 DIAGNOSIS — E039 Hypothyroidism, unspecified: Secondary | ICD-10-CM | POA: Diagnosis not present

## 2018-12-26 DIAGNOSIS — Z743 Need for continuous supervision: Secondary | ICD-10-CM | POA: Diagnosis not present

## 2018-12-26 DIAGNOSIS — J9611 Chronic respiratory failure with hypoxia: Secondary | ICD-10-CM | POA: Diagnosis not present

## 2018-12-26 DIAGNOSIS — J9622 Acute and chronic respiratory failure with hypercapnia: Secondary | ICD-10-CM | POA: Diagnosis not present

## 2018-12-26 DIAGNOSIS — Z23 Encounter for immunization: Secondary | ICD-10-CM | POA: Diagnosis not present

## 2018-12-26 DIAGNOSIS — R2689 Other abnormalities of gait and mobility: Secondary | ICD-10-CM | POA: Diagnosis not present

## 2018-12-26 DIAGNOSIS — E785 Hyperlipidemia, unspecified: Secondary | ICD-10-CM | POA: Diagnosis not present

## 2018-12-26 DIAGNOSIS — R739 Hyperglycemia, unspecified: Secondary | ICD-10-CM | POA: Diagnosis not present

## 2018-12-26 DIAGNOSIS — F4321 Adjustment disorder with depressed mood: Secondary | ICD-10-CM | POA: Diagnosis not present

## 2018-12-26 DIAGNOSIS — K219 Gastro-esophageal reflux disease without esophagitis: Secondary | ICD-10-CM | POA: Diagnosis not present

## 2018-12-26 DIAGNOSIS — M6281 Muscle weakness (generalized): Secondary | ICD-10-CM | POA: Diagnosis not present

## 2018-12-26 DIAGNOSIS — I69391 Dysphagia following cerebral infarction: Secondary | ICD-10-CM | POA: Diagnosis not present

## 2018-12-26 DIAGNOSIS — E789 Disorder of lipoprotein metabolism, unspecified: Secondary | ICD-10-CM | POA: Diagnosis not present

## 2018-12-26 DIAGNOSIS — R1310 Dysphagia, unspecified: Secondary | ICD-10-CM | POA: Diagnosis not present

## 2018-12-26 DIAGNOSIS — I69398 Other sequelae of cerebral infarction: Secondary | ICD-10-CM | POA: Diagnosis not present

## 2018-12-26 DIAGNOSIS — Z7409 Other reduced mobility: Secondary | ICD-10-CM | POA: Diagnosis not present

## 2018-12-26 DIAGNOSIS — D6489 Other specified anemias: Secondary | ICD-10-CM | POA: Diagnosis not present

## 2018-12-26 DIAGNOSIS — I69351 Hemiplegia and hemiparesis following cerebral infarction affecting right dominant side: Secondary | ICD-10-CM | POA: Diagnosis not present

## 2018-12-26 DIAGNOSIS — I4891 Unspecified atrial fibrillation: Secondary | ICD-10-CM | POA: Diagnosis not present

## 2018-12-26 DIAGNOSIS — R4701 Aphasia: Secondary | ICD-10-CM | POA: Diagnosis not present

## 2018-12-26 DIAGNOSIS — I480 Paroxysmal atrial fibrillation: Secondary | ICD-10-CM | POA: Diagnosis not present

## 2018-12-26 DIAGNOSIS — I63521 Cerebral infarction due to unspecified occlusion or stenosis of right anterior cerebral artery: Secondary | ICD-10-CM | POA: Diagnosis not present

## 2018-12-26 DIAGNOSIS — R5381 Other malaise: Secondary | ICD-10-CM | POA: Diagnosis not present

## 2018-12-26 DIAGNOSIS — Z950 Presence of cardiac pacemaker: Secondary | ICD-10-CM | POA: Diagnosis not present

## 2018-12-26 DIAGNOSIS — K5900 Constipation, unspecified: Secondary | ICD-10-CM | POA: Diagnosis not present

## 2018-12-26 DIAGNOSIS — K649 Unspecified hemorrhoids: Secondary | ICD-10-CM | POA: Diagnosis not present

## 2018-12-26 DIAGNOSIS — I517 Cardiomegaly: Secondary | ICD-10-CM | POA: Diagnosis not present

## 2018-12-26 DIAGNOSIS — I959 Hypotension, unspecified: Secondary | ICD-10-CM | POA: Diagnosis not present

## 2018-12-26 LAB — CBC
HCT: 36.7 % (ref 36.0–46.0)
Hemoglobin: 12.4 g/dL (ref 12.0–15.0)
MCH: 31.8 pg (ref 26.0–34.0)
MCHC: 33.8 g/dL (ref 30.0–36.0)
MCV: 94.1 fL (ref 80.0–100.0)
Platelets: 390 10*3/uL (ref 150–400)
RBC: 3.9 MIL/uL (ref 3.87–5.11)
RDW: 12.7 % (ref 11.5–15.5)
WBC: 5.3 10*3/uL (ref 4.0–10.5)
nRBC: 0 % (ref 0.0–0.2)

## 2018-12-26 MED ORDER — APIXABAN 5 MG PO TABS: 5.0000 mg | ORAL_TABLET | Freq: Two times a day (BID) | ORAL | Status: DC

## 2018-12-26 MED ORDER — POTASSIUM CHLORIDE 20 MEQ/15ML (10%) PO SOLN: 20.0000 meq | Freq: Every day | ORAL | 0 refills | Status: DC

## 2018-12-26 MED ORDER — PANTOPRAZOLE SODIUM 40 MG PO PACK: 40.0000 mg | PACK | Freq: Every day | ORAL | Status: DC

## 2018-12-26 NOTE — Progress Notes (Signed)
Social Work Patient ID: [name], female   DOB: [birth year], 85 y.o.   MRN: [id number]   Pt has been accepted at [facility] and bed is available for tx today.  CSW informed pt, son, and medical/therapy teams of the above.  CSW will arrange non-emergent ambulance and facilitate tx to [facility] for ST SNF care.  CSW available as needed.

## 2018-12-26 NOTE — Progress Notes (Signed)
[place] PHYSICAL MEDICINE & REHABILITATION PROGRESS NOTE   Subjective/Complaints: Patient seen laying in bed this morning.  Limited by aphasia.  T head ordered yesterday per neurology recs, discussed with neurology, start anticoagulation.  Please see PA note as well.  ROS: limited due to cognition   Objective:   Ct Head Wo Contrast  Result Date: 12/25/2018 CLINICAL DATA:  Stroke, follow-up EXAM: CT HEAD WITHOUT CONTRAST TECHNIQUE: Contiguous axial images were obtained from the base of the skull through the vertex without intravenous contrast. COMPARISON:  12/04/2018 FINDINGS: Brain: Large area of encephalomalacia again noted in the left cerebral hemisphere, stable since prior study. Low-density noted in the anterior left frontal lobe compatible with evolving acute to subacute left frontal infarct. Old bilateral basal ganglia lacunar infarcts. Chronic small vessel disease throughout the deep white matter. No new area of infarction. No hemorrhage. Vascular: Aneurysm clips in the region of the left MCA bifurcation. Moderate calcifications. Skull: Rib prior left temporoparietal craniotomy. No acute findings. Sinuses/Orbits: Visualized paranasal sinuses and mastoids clear. Orbital soft tissues unremarkable. Other: None IMPRESSION: Evolutionary changes in the anterior left frontal infarct. No new areas of infarct or hemorrhage. Old left MCA territory infarct with encephalomalacia. Electronically Signed   By: Rolm Baptise M.D.   On: 12/25/2018 11:54   Recent Labs    12/26/18 0604  WBC 5.3  HGB 12.4  HCT 36.7  PLT 390   Recent Labs    12/25/18 0714  NA 138  K 3.7  CL 104  CO2 23  GLUCOSE 102*  BUN 23  CREATININE 0.89  CALCIUM 8.7*    Intake/Output Summary (Last 24 hours) at 12/26/2018 1249 Last data filed at 12/25/2018 1300 Gross per 24 hour  Intake 240 ml  Output -  Net 240 ml     Physical Exam: Vital Signs Blood pressure (!) 132/57, pulse 60, temperature 98.2 F (36.8 C),  temperature source Oral, resp. rate 18, height 5\' 5"  (1.651 m), weight 68.1 kg, SpO2 95 %. Constitutional: No distress . Vital signs reviewed. HENT: Normocephalic.  Atraumatic. Eyes: EOMI. No discharge. Cardiovascular: Cardiac controlled.  No JVD. Respiratory: CTA bilaterally.  Normal effort. GI: BS +. Non-distended. Musc: No edema or tenderness in extremities. Neurological: She isalert. Global aphasia, persistent Motor: Limited due to participation and apraxia, however moving all extremities, unchanged Skin: Warm and dry.  Intact.  Assessment/Plan: 1. Functional deficits secondary to left frontal infarct which require 3+ hours per day of interdisciplinary therapy in a comprehensive inpatient rehab setting.  Physiatrist is providing close team supervision and 24 hour management of active medical problems listed below.  Physiatrist and rehab team continue to assess barriers to discharge/monitor patient progress toward functional and medical goals  Care Tool:  Bathing  Bathing activity did not occur: Safety/medical concerns Body parts bathed by patient: Right arm, Left arm, Chest, Abdomen, Front perineal area, Face, Left lower leg, Right lower leg, Left upper leg, Right upper leg   Body parts bathed by helper: Buttocks Body parts n/a: Front perineal area, Buttocks   Bathing assist Assist Level: Moderate Assistance - Patient 50 - 74%     Upper Body Dressing/Undressing Upper body dressing   What is the patient wearing?: Pull over shirt    Upper body assist Assist Level: Moderate Assistance - Patient 50 - 74%    Lower Body Dressing/Undressing Lower body dressing      What is the patient wearing?: Incontinence brief, Pants     Lower body assist Assist for lower  body dressing: Maximal Assistance - Patient 25 - 49%     Toileting Toileting Toileting Activity did not occur Landscape architect and hygiene only): Refused  Toileting assist Assist for toileting: Maximal  Assistance - Patient 25 - 49%     Transfers Chair/bed transfer  Transfers assist     Chair/bed transfer assist level: Moderate Assistance - Patient 50 - 74%     Locomotion Ambulation   Ambulation assist   Ambulation activity did not occur: Safety/medical concerns  Assist level: Moderate Assistance - Patient 50 - 74% Assistive device: Walker-rolling Max distance: 16 ft   Walk 10 feet activity   Assist  Walk 10 feet activity did not occur: Safety/medical concerns  Assist level: Moderate Assistance - Patient - 50 - 74% Assistive device: Walker-rolling   Walk 50 feet activity   Assist Walk 50 feet with 2 turns activity did not occur: Safety/medical concerns(fatigue, poor balance)         Walk 150 feet activity   Assist Walk 150 feet activity did not occur: Safety/medical concerns         Walk 10 feet on uneven surface  activity   Assist Walk 10 feet on uneven surfaces activity did not occur: Safety/medical concerns         Wheelchair     Assist Will patient use wheelchair at discharge?: Yes Type of Wheelchair: Manual Wheelchair activity did not occur: Safety/medical concerns  Wheelchair assist level: Supervision/Verbal cueing Max wheelchair distance: 151ft    Wheelchair 50 feet with 2 turns activity    Assist    Wheelchair 50 feet with 2 turns activity did not occur: Safety/medical concerns   Assist Level: Supervision/Verbal cueing   Wheelchair 150 feet activity     Assist Wheelchair 150 feet activity did not occur: Safety/medical concerns(fatigue)   Assist Level: Minimal Assistance - Patient > 75%    Medical Problem List and Plan: 1.Functional declinesecondary toLeft frontal/ACA infarct.             DC today to SNF  Patient to follow-up with MD in 1 month for hospital follow-up 2. Antithrombotics: -DVT/anticoagulation:Pharmaceutical:Lovenox  CBC within normal limits on 4/1 -antiplatelet therapy:  Low dose ASA 3. Pain Management:Tylenol prn 4. Mood:LCSW to follow for evaluation and support. -antipsychotic agents: N/A 5. Neuropsych: This patientis not fullycapable of making decisions on onown behalf. 6. Skin/Wound Care:routine pressure relief measures. 7. Fluids/Electrolytes/Nutrition:encourage PO  D3 thins, advance diet as tolerated  Hypokalemia-supplementing KCL 56meq daily, potassium 3.7 on 3/31 8. HTN: Monitor BP bid. Continue Amlodipine and Cozaar daily Vitals:   12/25/18 2000 12/26/18 0521  BP: (!) 120/56 (!) 132/57  Pulse: 60 60  Resp: 19 18  Temp: 97.9 F (36.6 C) 98.2 F (36.8 C)  SpO2: 94% 95%   Controlled on 4/1 9. Dyslipidemia: On pravastatin.  10. 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   LOS: 19 days A FACE TO FACE EVALUATION WAS PERFORMED  Malay Fantroy Lorie Phenix 12/26/2018, 12:49 PM

## 2018-12-26 NOTE — Plan of Care (Signed)
  Problem: RH BOWEL ELIMINATION Goal: RH STG MANAGE BOWEL WITH ASSISTANCE Description STG Manage Bowel with Mod/ Max Assistance.  12/26/2018 0349 by Lazaro Arms, RN Outcome: Progressing 12/26/2018 0349 by Lazaro Arms, RN Outcome: Progressing   Problem: RH BLADDER ELIMINATION Goal: RH STG MANAGE BLADDER WITH ASSISTANCE Description STG Manage Bladder With Mod/ Max Assistance  12/26/2018 0349 by Lazaro Arms, RN Outcome: Progressing 12/26/2018 0349 by Lazaro Arms, RN Outcome: Progressing   Problem: RH SKIN INTEGRITY Goal: RH STG SKIN FREE OF INFECTION/BREAKDOWN Description No new breakdown with min assist   12/26/2018 0349 by Lazaro Arms, RN Outcome: Progressing 12/26/2018 0349 by Lazaro Arms, RN Outcome: Progressing   Problem: RH SAFETY Goal: RH STG ADHERE TO SAFETY PRECAUTIONS W/ASSISTANCE/DEVICE Description STG Adhere to Safety Precautions With Mod Assistance/Device.  12/26/2018 0349 by Lazaro Arms, RN Outcome: Progressing 12/26/2018 0349 by Lazaro Arms, RN Outcome: Progressing   Problem: RH PAIN MANAGEMENT Goal: RH STG PAIN MANAGED AT OR BELOW PT'S PAIN GOAL Description < 3 out of 10.   12/26/2018 0349 by Lazaro Arms, RN Outcome: Progressing 12/26/2018 0349 by Lazaro Arms, RN Outcome: Progressing

## 2018-12-26 NOTE — Progress Notes (Signed)
CT head reviewed by Dr. Leonie Man who felt patient could be started on DOAC. Dose discussed with pharmcy--patient with BMI 68 with creatine clearance WNL- Eliquis 5 mg bid recommended. Son notified.

## 2018-12-26 NOTE — Progress Notes (Signed)
Report was call to [place] at [facility]. Is ready to be transfer.

## 2018-12-28 DIAGNOSIS — K5900 Constipation, unspecified: Secondary | ICD-10-CM | POA: Diagnosis not present

## 2018-12-28 DIAGNOSIS — I10 Essential (primary) hypertension: Secondary | ICD-10-CM | POA: Diagnosis not present

## 2018-12-28 DIAGNOSIS — K219 Gastro-esophageal reflux disease without esophagitis: Secondary | ICD-10-CM | POA: Diagnosis not present

## 2018-12-28 DIAGNOSIS — M6281 Muscle weakness (generalized): Secondary | ICD-10-CM | POA: Diagnosis not present

## 2018-12-28 DIAGNOSIS — E039 Hypothyroidism, unspecified: Secondary | ICD-10-CM | POA: Diagnosis not present

## 2018-12-28 DIAGNOSIS — I4891 Unspecified atrial fibrillation: Secondary | ICD-10-CM | POA: Diagnosis not present

## 2018-12-28 DIAGNOSIS — J9611 Chronic respiratory failure with hypoxia: Secondary | ICD-10-CM | POA: Diagnosis not present

## 2018-12-28 DIAGNOSIS — D6489 Other specified anemias: Secondary | ICD-10-CM | POA: Diagnosis not present

## 2018-12-28 DIAGNOSIS — F0150 Vascular dementia without behavioral disturbance: Secondary | ICD-10-CM | POA: Diagnosis not present

## 2018-12-28 DIAGNOSIS — E785 Hyperlipidemia, unspecified: Secondary | ICD-10-CM | POA: Diagnosis not present

## 2018-12-28 NOTE — Progress Notes (Signed)
Social Work Discharge Note  The overall goal for the admission was met for:   Discharge location: No - [facility] - pt required tx to SNF for more rehabilitation before trying to return to [facility].    Length of Stay: Yes - 18 days  Discharge activity level: No -moderate to max assist   Home/community participation: No - Pt needed ST SNF.  Services provided included: MD, RD, PT, OT, SLP, RN, Pharmacy and SW  Financial Services: Medicare and Private Insurance: [employer]  Follow-up services arranged: Other: [facility] SN[facility]  Comments (or additional information): Pt transferred to [facility] via non-emergency ambulance PTAR.  Necessary records accompanied her.  Patient/Family verbalized understanding of follow-up arrangements: Yes  Individual responsible for coordination of the follow-up plan: Pt's son Hiba Garry [phone]  Confirmed correct DME delivered: Trey Sailors 12/28/2018    Vonetta Foulk, Silvestre Mesi

## 2018-12-31 DIAGNOSIS — E039 Hypothyroidism, unspecified: Secondary | ICD-10-CM | POA: Diagnosis not present

## 2018-12-31 DIAGNOSIS — J9611 Chronic respiratory failure with hypoxia: Secondary | ICD-10-CM | POA: Diagnosis not present

## 2018-12-31 DIAGNOSIS — E785 Hyperlipidemia, unspecified: Secondary | ICD-10-CM | POA: Diagnosis not present

## 2018-12-31 DIAGNOSIS — M6281 Muscle weakness (generalized): Secondary | ICD-10-CM | POA: Diagnosis not present

## 2018-12-31 DIAGNOSIS — I4891 Unspecified atrial fibrillation: Secondary | ICD-10-CM | POA: Diagnosis not present

## 2018-12-31 DIAGNOSIS — K219 Gastro-esophageal reflux disease without esophagitis: Secondary | ICD-10-CM | POA: Diagnosis not present

## 2018-12-31 DIAGNOSIS — F0150 Vascular dementia without behavioral disturbance: Secondary | ICD-10-CM | POA: Diagnosis not present

## 2018-12-31 DIAGNOSIS — D6489 Other specified anemias: Secondary | ICD-10-CM | POA: Diagnosis not present

## 2018-12-31 DIAGNOSIS — I10 Essential (primary) hypertension: Secondary | ICD-10-CM | POA: Diagnosis not present

## 2018-12-31 DIAGNOSIS — K5900 Constipation, unspecified: Secondary | ICD-10-CM | POA: Diagnosis not present

## 2019-01-01 ENCOUNTER — Encounter: Payer: Medicare Other | Admitting: Registered Nurse

## 2019-01-01 DIAGNOSIS — I69351 Hemiplegia and hemiparesis following cerebral infarction affecting right dominant side: Secondary | ICD-10-CM | POA: Diagnosis not present

## 2019-01-01 DIAGNOSIS — I4891 Unspecified atrial fibrillation: Secondary | ICD-10-CM | POA: Diagnosis not present

## 2019-01-01 DIAGNOSIS — I10 Essential (primary) hypertension: Secondary | ICD-10-CM | POA: Diagnosis not present

## 2019-01-01 DIAGNOSIS — E785 Hyperlipidemia, unspecified: Secondary | ICD-10-CM | POA: Diagnosis not present

## 2019-01-01 DIAGNOSIS — E039 Hypothyroidism, unspecified: Secondary | ICD-10-CM | POA: Diagnosis not present

## 2019-01-01 DIAGNOSIS — J9611 Chronic respiratory failure with hypoxia: Secondary | ICD-10-CM | POA: Diagnosis not present

## 2019-01-01 DIAGNOSIS — I69320 Aphasia following cerebral infarction: Secondary | ICD-10-CM | POA: Diagnosis not present

## 2019-01-01 DIAGNOSIS — K5900 Constipation, unspecified: Secondary | ICD-10-CM | POA: Diagnosis not present

## 2019-01-01 DIAGNOSIS — K219 Gastro-esophageal reflux disease without esophagitis: Secondary | ICD-10-CM | POA: Diagnosis not present

## 2019-01-01 DIAGNOSIS — F0150 Vascular dementia without behavioral disturbance: Secondary | ICD-10-CM | POA: Diagnosis not present

## 2019-01-01 DIAGNOSIS — M6281 Muscle weakness (generalized): Secondary | ICD-10-CM | POA: Diagnosis not present

## 2019-01-01 DIAGNOSIS — I69391 Dysphagia following cerebral infarction: Secondary | ICD-10-CM | POA: Diagnosis not present

## 2019-01-02 ENCOUNTER — Other Ambulatory Visit: Payer: Self-pay | Admitting: *Deleted

## 2019-01-02 NOTE — Patient Outreach (Signed)
[facility]) Care Management  01/02/2019  [name] 05-Mar-1922 [id number]   Per Patient [name] member is admitted to [facility] SNF for rehab.  Per chart review member was residing at [facility] prior to hospitalization. Noted member has a history of HTN, CVA, dementia, afib, global aphasia.  Will continue to collaborate with [facility] UM and facility regarding disposition plans and potential [facility] Care Management needs.   Marthenia Rolling, MSN-Ed, RN,BSN [place] Acute Care Coordinator [phone]

## 2019-01-04 DIAGNOSIS — I69320 Aphasia following cerebral infarction: Secondary | ICD-10-CM | POA: Diagnosis not present

## 2019-01-04 DIAGNOSIS — E785 Hyperlipidemia, unspecified: Secondary | ICD-10-CM | POA: Diagnosis not present

## 2019-01-04 DIAGNOSIS — F0150 Vascular dementia without behavioral disturbance: Secondary | ICD-10-CM | POA: Diagnosis not present

## 2019-01-04 DIAGNOSIS — M6281 Muscle weakness (generalized): Secondary | ICD-10-CM | POA: Diagnosis not present

## 2019-01-04 DIAGNOSIS — K5900 Constipation, unspecified: Secondary | ICD-10-CM | POA: Diagnosis not present

## 2019-01-04 DIAGNOSIS — I10 Essential (primary) hypertension: Secondary | ICD-10-CM | POA: Diagnosis not present

## 2019-01-04 DIAGNOSIS — E039 Hypothyroidism, unspecified: Secondary | ICD-10-CM | POA: Diagnosis not present

## 2019-01-04 DIAGNOSIS — I4891 Unspecified atrial fibrillation: Secondary | ICD-10-CM | POA: Diagnosis not present

## 2019-01-04 DIAGNOSIS — I69351 Hemiplegia and hemiparesis following cerebral infarction affecting right dominant side: Secondary | ICD-10-CM | POA: Diagnosis not present

## 2019-01-04 DIAGNOSIS — J9611 Chronic respiratory failure with hypoxia: Secondary | ICD-10-CM | POA: Diagnosis not present

## 2019-01-04 DIAGNOSIS — K219 Gastro-esophageal reflux disease without esophagitis: Secondary | ICD-10-CM | POA: Diagnosis not present

## 2019-01-04 DIAGNOSIS — I69391 Dysphagia following cerebral infarction: Secondary | ICD-10-CM | POA: Diagnosis not present

## 2019-01-07 DIAGNOSIS — I4891 Unspecified atrial fibrillation: Secondary | ICD-10-CM | POA: Diagnosis not present

## 2019-01-07 DIAGNOSIS — M6281 Muscle weakness (generalized): Secondary | ICD-10-CM | POA: Diagnosis not present

## 2019-01-07 DIAGNOSIS — I69351 Hemiplegia and hemiparesis following cerebral infarction affecting right dominant side: Secondary | ICD-10-CM | POA: Diagnosis not present

## 2019-01-07 DIAGNOSIS — E785 Hyperlipidemia, unspecified: Secondary | ICD-10-CM | POA: Diagnosis not present

## 2019-01-07 DIAGNOSIS — I10 Essential (primary) hypertension: Secondary | ICD-10-CM | POA: Diagnosis not present

## 2019-01-07 DIAGNOSIS — I69320 Aphasia following cerebral infarction: Secondary | ICD-10-CM | POA: Diagnosis not present

## 2019-01-07 DIAGNOSIS — J9611 Chronic respiratory failure with hypoxia: Secondary | ICD-10-CM | POA: Diagnosis not present

## 2019-01-07 DIAGNOSIS — E039 Hypothyroidism, unspecified: Secondary | ICD-10-CM | POA: Diagnosis not present

## 2019-01-07 DIAGNOSIS — K649 Unspecified hemorrhoids: Secondary | ICD-10-CM | POA: Diagnosis not present

## 2019-01-07 DIAGNOSIS — K219 Gastro-esophageal reflux disease without esophagitis: Secondary | ICD-10-CM | POA: Diagnosis not present

## 2019-01-07 DIAGNOSIS — I69391 Dysphagia following cerebral infarction: Secondary | ICD-10-CM | POA: Diagnosis not present

## 2019-01-07 DIAGNOSIS — K5900 Constipation, unspecified: Secondary | ICD-10-CM | POA: Diagnosis not present

## 2019-01-09 DIAGNOSIS — I69320 Aphasia following cerebral infarction: Secondary | ICD-10-CM | POA: Diagnosis not present

## 2019-01-09 DIAGNOSIS — K5900 Constipation, unspecified: Secondary | ICD-10-CM | POA: Diagnosis not present

## 2019-01-09 DIAGNOSIS — I69391 Dysphagia following cerebral infarction: Secondary | ICD-10-CM | POA: Diagnosis not present

## 2019-01-09 DIAGNOSIS — I4891 Unspecified atrial fibrillation: Secondary | ICD-10-CM | POA: Diagnosis not present

## 2019-01-09 DIAGNOSIS — E785 Hyperlipidemia, unspecified: Secondary | ICD-10-CM | POA: Diagnosis not present

## 2019-01-09 DIAGNOSIS — K649 Unspecified hemorrhoids: Secondary | ICD-10-CM | POA: Diagnosis not present

## 2019-01-09 DIAGNOSIS — E039 Hypothyroidism, unspecified: Secondary | ICD-10-CM | POA: Diagnosis not present

## 2019-01-09 DIAGNOSIS — I69351 Hemiplegia and hemiparesis following cerebral infarction affecting right dominant side: Secondary | ICD-10-CM | POA: Diagnosis not present

## 2019-01-09 DIAGNOSIS — M6281 Muscle weakness (generalized): Secondary | ICD-10-CM | POA: Diagnosis not present

## 2019-01-09 DIAGNOSIS — K219 Gastro-esophageal reflux disease without esophagitis: Secondary | ICD-10-CM | POA: Diagnosis not present

## 2019-01-09 DIAGNOSIS — I10 Essential (primary) hypertension: Secondary | ICD-10-CM | POA: Diagnosis not present

## 2019-01-09 DIAGNOSIS — J9611 Chronic respiratory failure with hypoxia: Secondary | ICD-10-CM | POA: Diagnosis not present

## 2019-01-14 DIAGNOSIS — R5381 Other malaise: Secondary | ICD-10-CM | POA: Diagnosis not present

## 2019-01-14 DIAGNOSIS — Z7409 Other reduced mobility: Secondary | ICD-10-CM | POA: Diagnosis not present

## 2019-01-14 DIAGNOSIS — J9622 Acute and chronic respiratory failure with hypercapnia: Secondary | ICD-10-CM | POA: Diagnosis not present

## 2019-01-14 DIAGNOSIS — M6281 Muscle weakness (generalized): Secondary | ICD-10-CM | POA: Diagnosis not present

## 2019-01-15 DIAGNOSIS — R0989 Other specified symptoms and signs involving the circulatory and respiratory systems: Secondary | ICD-10-CM | POA: Diagnosis not present

## 2019-01-15 DIAGNOSIS — I69398 Other sequelae of cerebral infarction: Secondary | ICD-10-CM | POA: Diagnosis not present

## 2019-01-15 DIAGNOSIS — R0902 Hypoxemia: Secondary | ICD-10-CM | POA: Diagnosis not present

## 2019-01-15 DIAGNOSIS — F4321 Adjustment disorder with depressed mood: Secondary | ICD-10-CM | POA: Diagnosis not present

## 2019-01-16 ENCOUNTER — Other Ambulatory Visit: Payer: Self-pay | Admitting: Internal Medicine

## 2019-01-16 ENCOUNTER — Other Ambulatory Visit (HOSPITAL_BASED_OUTPATIENT_CLINIC_OR_DEPARTMENT_OTHER): Payer: Self-pay | Admitting: Internal Medicine

## 2019-01-16 DIAGNOSIS — R5381 Other malaise: Secondary | ICD-10-CM

## 2019-01-16 DIAGNOSIS — I69398 Other sequelae of cerebral infarction: Secondary | ICD-10-CM | POA: Diagnosis not present

## 2019-01-18 ENCOUNTER — Other Ambulatory Visit: Payer: Self-pay | Admitting: *Deleted

## 2019-01-18 NOTE — Patient Outreach (Signed)
[facility]) Care Management  01/18/2019  [name] 07-25-1922 [id number]   Member assessed for potential [place] Management needs as a benefit of her [employer] Medicare insurance.  Spoke with [facility] UM RN about member. [name] is currently at [facility]. Member's disposition plan is to return to [facility] with 24/7 caregiver support.   No identifiable [facility] Care Management needs at this time.  Plan to sign off.   Marthenia Rolling, MSN-Ed, RN,BSN [place] Acute Care Coordinator [phone]

## 2019-01-21 ENCOUNTER — Other Ambulatory Visit: Payer: Self-pay | Admitting: Nurse Practitioner

## 2019-01-21 DIAGNOSIS — I639 Cerebral infarction, unspecified: Secondary | ICD-10-CM

## 2019-01-23 ENCOUNTER — Other Ambulatory Visit: Payer: Self-pay | Admitting: Geriatric Medicine

## 2019-01-23 DIAGNOSIS — J9611 Chronic respiratory failure with hypoxia: Secondary | ICD-10-CM | POA: Diagnosis not present

## 2019-01-23 DIAGNOSIS — E039 Hypothyroidism, unspecified: Secondary | ICD-10-CM | POA: Diagnosis not present

## 2019-01-23 DIAGNOSIS — I69391 Dysphagia following cerebral infarction: Secondary | ICD-10-CM | POA: Diagnosis not present

## 2019-01-23 DIAGNOSIS — R739 Hyperglycemia, unspecified: Secondary | ICD-10-CM | POA: Diagnosis not present

## 2019-01-23 DIAGNOSIS — K649 Unspecified hemorrhoids: Secondary | ICD-10-CM | POA: Diagnosis not present

## 2019-01-23 DIAGNOSIS — I4891 Unspecified atrial fibrillation: Secondary | ICD-10-CM | POA: Diagnosis not present

## 2019-01-23 DIAGNOSIS — M6281 Muscle weakness (generalized): Secondary | ICD-10-CM | POA: Diagnosis not present

## 2019-01-23 DIAGNOSIS — I10 Essential (primary) hypertension: Secondary | ICD-10-CM | POA: Diagnosis not present

## 2019-01-23 DIAGNOSIS — I639 Cerebral infarction, unspecified: Secondary | ICD-10-CM

## 2019-01-23 DIAGNOSIS — K5900 Constipation, unspecified: Secondary | ICD-10-CM | POA: Diagnosis not present

## 2019-01-23 DIAGNOSIS — I69320 Aphasia following cerebral infarction: Secondary | ICD-10-CM | POA: Diagnosis not present

## 2019-01-23 DIAGNOSIS — K219 Gastro-esophageal reflux disease without esophagitis: Secondary | ICD-10-CM | POA: Diagnosis not present

## 2019-01-23 DIAGNOSIS — I69351 Hemiplegia and hemiparesis following cerebral infarction affecting right dominant side: Secondary | ICD-10-CM | POA: Diagnosis not present

## 2019-01-25 ENCOUNTER — Other Ambulatory Visit: Payer: Self-pay

## 2019-01-25 ENCOUNTER — Ambulatory Visit
Admission: RE | Admit: 2019-01-25 | Discharge: 2019-01-25 | Disposition: A | Discharge status: Home / Self Care | Payer: Medicare Other | Source: Ambulatory Visit | Attending: Nurse Practitioner | Admitting: Nurse Practitioner

## 2019-01-25 DIAGNOSIS — I639 Cerebral infarction, unspecified: Secondary | ICD-10-CM

## 2019-01-28 DIAGNOSIS — E785 Hyperlipidemia, unspecified: Secondary | ICD-10-CM | POA: Diagnosis not present

## 2019-01-28 DIAGNOSIS — K5900 Constipation, unspecified: Secondary | ICD-10-CM | POA: Diagnosis not present

## 2019-01-28 DIAGNOSIS — I69391 Dysphagia following cerebral infarction: Secondary | ICD-10-CM | POA: Diagnosis not present

## 2019-01-28 DIAGNOSIS — F0150 Vascular dementia without behavioral disturbance: Secondary | ICD-10-CM | POA: Diagnosis not present

## 2019-01-28 DIAGNOSIS — J9611 Chronic respiratory failure with hypoxia: Secondary | ICD-10-CM | POA: Diagnosis not present

## 2019-01-28 DIAGNOSIS — K219 Gastro-esophageal reflux disease without esophagitis: Secondary | ICD-10-CM | POA: Diagnosis not present

## 2019-01-28 DIAGNOSIS — M6281 Muscle weakness (generalized): Secondary | ICD-10-CM | POA: Diagnosis not present

## 2019-01-28 DIAGNOSIS — I10 Essential (primary) hypertension: Secondary | ICD-10-CM | POA: Diagnosis not present

## 2019-01-28 DIAGNOSIS — I69351 Hemiplegia and hemiparesis following cerebral infarction affecting right dominant side: Secondary | ICD-10-CM | POA: Diagnosis not present

## 2019-01-28 DIAGNOSIS — E039 Hypothyroidism, unspecified: Secondary | ICD-10-CM | POA: Diagnosis not present

## 2019-01-28 DIAGNOSIS — I4891 Unspecified atrial fibrillation: Secondary | ICD-10-CM | POA: Diagnosis not present

## 2019-01-28 DIAGNOSIS — I69320 Aphasia following cerebral infarction: Secondary | ICD-10-CM | POA: Diagnosis not present

## 2019-02-06 ENCOUNTER — Telehealth: Payer: Self-pay | Admitting: Internal Medicine

## 2019-02-06 NOTE — Telephone Encounter (Signed)
Copied from [place]. Topic: Quick Communication - See Telephone Encounter >> Feb 06, 2019  3:17 PM Ivar Drape wrote: CRM for notification. See Telephone encounter for: 02/06/19. Pleas Patricia, the patient's POA stated they are in need of a prescription for a recliner lift chair.  It can be sent to:  [facility], AttnJeanette Caprice [facility]: [phone] Fax: [phone]

## 2019-02-07 ENCOUNTER — Telehealth: Payer: Self-pay | Admitting: *Deleted

## 2019-02-07 NOTE — Telephone Encounter (Signed)
Called pt/daughter-in-law (rebecca) there was no answer LMOM RTC. Sent CRM to [facility] for FYI.Marland KitchenJohny Chess

## 2019-02-07 NOTE — Telephone Encounter (Signed)
Daughter-in-law called back she states once hse was d/c from [facility] she was placed into [facility], but not sure how they would be able to bring her in or do an virtual visit. [facility] is not letting any visitors in.  She states she is not able to fully answer questions due to the stroke, and her already having dementia. She states she has declined a whole lot. Since the stroke she is confined to a wheelchair. Also she states her husband called yesterday about getting an rx for lift chair. She is requesting status for the rx for lift chair so that she is able to get out of a chair.Marland KitchenJohny Chess

## 2019-02-07 NOTE — Telephone Encounter (Signed)
Rec'd msg on Patient ping stating on 12/26/18 pt was admitted to [facility] and [facility] skilled from [facility]. Pt has been discharge 02/06/19 to Home. Will contact pt/family to follow-up and make hosp f/u if need too.Marland KitchenJohny Chess

## 2019-02-08 DIAGNOSIS — R41841 Cognitive communication deficit: Secondary | ICD-10-CM | POA: Diagnosis not present

## 2019-02-08 DIAGNOSIS — I69252 Hemiplegia and hemiparesis following other nontraumatic intracranial hemorrhage affecting left dominant side: Secondary | ICD-10-CM | POA: Diagnosis not present

## 2019-02-08 DIAGNOSIS — R2681 Unsteadiness on feet: Secondary | ICD-10-CM | POA: Diagnosis not present

## 2019-02-08 DIAGNOSIS — R2689 Other abnormalities of gait and mobility: Secondary | ICD-10-CM | POA: Diagnosis not present

## 2019-02-08 DIAGNOSIS — R278 Other lack of coordination: Secondary | ICD-10-CM | POA: Diagnosis not present

## 2019-02-08 DIAGNOSIS — R260 Ataxic gait: Secondary | ICD-10-CM | POA: Diagnosis not present

## 2019-02-08 DIAGNOSIS — M6281 Muscle weakness (generalized): Secondary | ICD-10-CM | POA: Diagnosis not present

## 2019-02-08 DIAGNOSIS — I69328 Other speech and language deficits following cerebral infarction: Secondary | ICD-10-CM | POA: Diagnosis not present

## 2019-02-08 NOTE — Telephone Encounter (Signed)
Faxed

## 2019-02-08 NOTE — Telephone Encounter (Signed)
Hardcopy rx for lazy boy lift chair done to [name]

## 2019-02-11 DIAGNOSIS — R2681 Unsteadiness on feet: Secondary | ICD-10-CM | POA: Diagnosis not present

## 2019-02-11 DIAGNOSIS — M6281 Muscle weakness (generalized): Secondary | ICD-10-CM | POA: Diagnosis not present

## 2019-02-11 DIAGNOSIS — R278 Other lack of coordination: Secondary | ICD-10-CM | POA: Diagnosis not present

## 2019-02-11 DIAGNOSIS — R2689 Other abnormalities of gait and mobility: Secondary | ICD-10-CM | POA: Diagnosis not present

## 2019-02-11 DIAGNOSIS — R41841 Cognitive communication deficit: Secondary | ICD-10-CM | POA: Diagnosis not present

## 2019-02-11 DIAGNOSIS — R260 Ataxic gait: Secondary | ICD-10-CM | POA: Diagnosis not present

## 2019-02-12 DIAGNOSIS — R260 Ataxic gait: Secondary | ICD-10-CM | POA: Diagnosis not present

## 2019-02-12 DIAGNOSIS — R2681 Unsteadiness on feet: Secondary | ICD-10-CM | POA: Diagnosis not present

## 2019-02-12 DIAGNOSIS — M6281 Muscle weakness (generalized): Secondary | ICD-10-CM | POA: Diagnosis not present

## 2019-02-12 DIAGNOSIS — R278 Other lack of coordination: Secondary | ICD-10-CM | POA: Diagnosis not present

## 2019-02-12 DIAGNOSIS — R41841 Cognitive communication deficit: Secondary | ICD-10-CM | POA: Diagnosis not present

## 2019-02-12 DIAGNOSIS — R2689 Other abnormalities of gait and mobility: Secondary | ICD-10-CM | POA: Diagnosis not present

## 2019-02-13 DIAGNOSIS — R278 Other lack of coordination: Secondary | ICD-10-CM | POA: Diagnosis not present

## 2019-02-13 DIAGNOSIS — M6281 Muscle weakness (generalized): Secondary | ICD-10-CM | POA: Diagnosis not present

## 2019-02-13 DIAGNOSIS — R2681 Unsteadiness on feet: Secondary | ICD-10-CM | POA: Diagnosis not present

## 2019-02-13 DIAGNOSIS — R2689 Other abnormalities of gait and mobility: Secondary | ICD-10-CM | POA: Diagnosis not present

## 2019-02-13 DIAGNOSIS — R260 Ataxic gait: Secondary | ICD-10-CM | POA: Diagnosis not present

## 2019-02-13 DIAGNOSIS — R41841 Cognitive communication deficit: Secondary | ICD-10-CM | POA: Diagnosis not present

## 2019-02-14 DIAGNOSIS — R41841 Cognitive communication deficit: Secondary | ICD-10-CM | POA: Diagnosis not present

## 2019-02-14 DIAGNOSIS — R278 Other lack of coordination: Secondary | ICD-10-CM | POA: Diagnosis not present

## 2019-02-14 DIAGNOSIS — M6281 Muscle weakness (generalized): Secondary | ICD-10-CM | POA: Diagnosis not present

## 2019-02-14 DIAGNOSIS — R2689 Other abnormalities of gait and mobility: Secondary | ICD-10-CM | POA: Diagnosis not present

## 2019-02-14 DIAGNOSIS — R260 Ataxic gait: Secondary | ICD-10-CM | POA: Diagnosis not present

## 2019-02-14 DIAGNOSIS — R2681 Unsteadiness on feet: Secondary | ICD-10-CM | POA: Diagnosis not present

## 2019-02-15 DIAGNOSIS — R260 Ataxic gait: Secondary | ICD-10-CM | POA: Diagnosis not present

## 2019-02-15 DIAGNOSIS — R2689 Other abnormalities of gait and mobility: Secondary | ICD-10-CM | POA: Diagnosis not present

## 2019-02-15 DIAGNOSIS — R41841 Cognitive communication deficit: Secondary | ICD-10-CM | POA: Diagnosis not present

## 2019-02-15 DIAGNOSIS — R278 Other lack of coordination: Secondary | ICD-10-CM | POA: Diagnosis not present

## 2019-02-15 DIAGNOSIS — R2681 Unsteadiness on feet: Secondary | ICD-10-CM | POA: Diagnosis not present

## 2019-02-15 DIAGNOSIS — M6281 Muscle weakness (generalized): Secondary | ICD-10-CM | POA: Diagnosis not present

## 2019-02-18 DIAGNOSIS — R41841 Cognitive communication deficit: Secondary | ICD-10-CM | POA: Diagnosis not present

## 2019-02-18 DIAGNOSIS — R260 Ataxic gait: Secondary | ICD-10-CM | POA: Diagnosis not present

## 2019-02-18 DIAGNOSIS — R2689 Other abnormalities of gait and mobility: Secondary | ICD-10-CM | POA: Diagnosis not present

## 2019-02-18 DIAGNOSIS — R2681 Unsteadiness on feet: Secondary | ICD-10-CM | POA: Diagnosis not present

## 2019-02-18 DIAGNOSIS — M6281 Muscle weakness (generalized): Secondary | ICD-10-CM | POA: Diagnosis not present

## 2019-02-18 DIAGNOSIS — R278 Other lack of coordination: Secondary | ICD-10-CM | POA: Diagnosis not present

## 2019-02-19 ENCOUNTER — Ambulatory Visit (INDEPENDENT_AMBULATORY_CARE_PROVIDER_SITE_OTHER): Payer: Medicare Other | Admitting: *Deleted

## 2019-02-19 DIAGNOSIS — R278 Other lack of coordination: Secondary | ICD-10-CM | POA: Diagnosis not present

## 2019-02-19 DIAGNOSIS — R41841 Cognitive communication deficit: Secondary | ICD-10-CM | POA: Diagnosis not present

## 2019-02-19 DIAGNOSIS — R260 Ataxic gait: Secondary | ICD-10-CM | POA: Diagnosis not present

## 2019-02-19 DIAGNOSIS — I495 Sick sinus syndrome: Secondary | ICD-10-CM

## 2019-02-19 DIAGNOSIS — M6281 Muscle weakness (generalized): Secondary | ICD-10-CM | POA: Diagnosis not present

## 2019-02-19 DIAGNOSIS — Z950 Presence of cardiac pacemaker: Secondary | ICD-10-CM

## 2019-02-19 DIAGNOSIS — R2681 Unsteadiness on feet: Secondary | ICD-10-CM | POA: Diagnosis not present

## 2019-02-19 DIAGNOSIS — R2689 Other abnormalities of gait and mobility: Secondary | ICD-10-CM | POA: Diagnosis not present

## 2019-02-19 LAB — CUP PACEART REMOTE DEVICE CHECK
Battery Impedance: 7019 Ohm
Battery Remaining Longevity: 1 mo — CL
Battery Voltage: 2.6 V
Brady Statistic AP VP Percent: 27 %
Brady Statistic AP VS Percent: 0 %
Brady Statistic AS VP Percent: 72 %
Brady Statistic AS VS Percent: 1 %
Date Time Interrogation Session: 20200526170156
Implantable Lead Implant Date: 20020920
Implantable Lead Implant Date: 20020920
Implantable Lead Location: 753859
Implantable Lead Location: 753860
Implantable Lead Model: 5076
Implantable Lead Model: 5076
Implantable Pulse Generator Implant Date: 20100721
Lead Channel Impedance Value: 507 Ohm
Lead Channel Impedance Value: 671 Ohm
Lead Channel Pacing Threshold Amplitude: 0.5 V
Lead Channel Pacing Threshold Amplitude: 1.25 V
Lead Channel Pacing Threshold Pulse Width: 0.4 ms
Lead Channel Pacing Threshold Pulse Width: 0.4 ms
Lead Channel Setting Pacing Amplitude: 2 V
Lead Channel Setting Pacing Amplitude: 2.5 V
Lead Channel Setting Pacing Pulse Width: 0.4 ms
Lead Channel Setting Sensing Sensitivity: 2.8 mV

## 2019-02-20 ENCOUNTER — Emergency Department (HOSPITAL_COMMUNITY): Payer: Medicare Other

## 2019-02-20 ENCOUNTER — Telehealth: Payer: Self-pay

## 2019-02-20 ENCOUNTER — Encounter (HOSPITAL_COMMUNITY): Payer: Self-pay

## 2019-02-20 ENCOUNTER — Other Ambulatory Visit: Payer: Self-pay

## 2019-02-20 ENCOUNTER — Inpatient Hospital Stay (HOSPITAL_COMMUNITY)
Admission: EM | Admit: 2019-02-20 | Discharge: 2019-02-22 | DRG: 690 | Disposition: A | Discharge status: Home Health | Payer: Medicare Other | Attending: Family Medicine | Admitting: Family Medicine

## 2019-02-20 DIAGNOSIS — F0390 Unspecified dementia without behavioral disturbance: Secondary | ICD-10-CM | POA: Diagnosis present

## 2019-02-20 DIAGNOSIS — F0391 Unspecified dementia with behavioral disturbance: Secondary | ICD-10-CM | POA: Diagnosis not present

## 2019-02-20 DIAGNOSIS — Z90710 Acquired absence of both cervix and uterus: Secondary | ICD-10-CM

## 2019-02-20 DIAGNOSIS — I4891 Unspecified atrial fibrillation: Secondary | ICD-10-CM | POA: Diagnosis not present

## 2019-02-20 DIAGNOSIS — I480 Paroxysmal atrial fibrillation: Secondary | ICD-10-CM

## 2019-02-20 DIAGNOSIS — L89152 Pressure ulcer of sacral region, stage 2: Secondary | ICD-10-CM | POA: Insufficient documentation

## 2019-02-20 DIAGNOSIS — R52 Pain, unspecified: Secondary | ICD-10-CM | POA: Diagnosis not present

## 2019-02-20 DIAGNOSIS — E890 Postprocedural hypothyroidism: Secondary | ICD-10-CM | POA: Diagnosis present

## 2019-02-20 DIAGNOSIS — Z7902 Long term (current) use of antithrombotics/antiplatelets: Secondary | ICD-10-CM

## 2019-02-20 DIAGNOSIS — R110 Nausea: Secondary | ICD-10-CM | POA: Diagnosis not present

## 2019-02-20 DIAGNOSIS — K5730 Diverticulosis of large intestine without perforation or abscess without bleeding: Secondary | ICD-10-CM | POA: Diagnosis present

## 2019-02-20 DIAGNOSIS — R4701 Aphasia: Secondary | ICD-10-CM | POA: Diagnosis present

## 2019-02-20 DIAGNOSIS — E785 Hyperlipidemia, unspecified: Secondary | ICD-10-CM | POA: Diagnosis present

## 2019-02-20 DIAGNOSIS — Z87440 Personal history of urinary (tract) infections: Secondary | ICD-10-CM

## 2019-02-20 DIAGNOSIS — Z79890 Hormone replacement therapy (postmenopausal): Secondary | ICD-10-CM

## 2019-02-20 DIAGNOSIS — I442 Atrioventricular block, complete: Secondary | ICD-10-CM | POA: Diagnosis present

## 2019-02-20 DIAGNOSIS — R404 Transient alteration of awareness: Secondary | ICD-10-CM | POA: Diagnosis not present

## 2019-02-20 DIAGNOSIS — M19041 Primary osteoarthritis, right hand: Secondary | ICD-10-CM | POA: Diagnosis present

## 2019-02-20 DIAGNOSIS — I110 Hypertensive heart disease with heart failure: Secondary | ICD-10-CM | POA: Diagnosis present

## 2019-02-20 DIAGNOSIS — Z20828 Contact with and (suspected) exposure to other viral communicable diseases: Secondary | ICD-10-CM | POA: Diagnosis not present

## 2019-02-20 DIAGNOSIS — M6281 Muscle weakness (generalized): Secondary | ICD-10-CM | POA: Diagnosis not present

## 2019-02-20 DIAGNOSIS — Z950 Presence of cardiac pacemaker: Secondary | ICD-10-CM | POA: Diagnosis not present

## 2019-02-20 DIAGNOSIS — I10 Essential (primary) hypertension: Secondary | ICD-10-CM | POA: Diagnosis not present

## 2019-02-20 DIAGNOSIS — Z885 Allergy status to narcotic agent status: Secondary | ICD-10-CM

## 2019-02-20 DIAGNOSIS — Z1159 Encounter for screening for other viral diseases: Secondary | ICD-10-CM

## 2019-02-20 DIAGNOSIS — I482 Chronic atrial fibrillation, unspecified: Secondary | ICD-10-CM | POA: Diagnosis present

## 2019-02-20 DIAGNOSIS — R680 Hypothermia, not associated with low environmental temperature: Secondary | ICD-10-CM | POA: Diagnosis present

## 2019-02-20 DIAGNOSIS — W19XXXA Unspecified fall, initial encounter: Secondary | ICD-10-CM | POA: Diagnosis not present

## 2019-02-20 DIAGNOSIS — I4821 Permanent atrial fibrillation: Secondary | ICD-10-CM | POA: Diagnosis not present

## 2019-02-20 DIAGNOSIS — R260 Ataxic gait: Secondary | ICD-10-CM | POA: Diagnosis not present

## 2019-02-20 DIAGNOSIS — Z79899 Other long term (current) drug therapy: Secondary | ICD-10-CM

## 2019-02-20 DIAGNOSIS — I509 Heart failure, unspecified: Secondary | ICD-10-CM | POA: Diagnosis present

## 2019-02-20 DIAGNOSIS — R2681 Unsteadiness on feet: Secondary | ICD-10-CM | POA: Diagnosis not present

## 2019-02-20 DIAGNOSIS — I2510 Atherosclerotic heart disease of native coronary artery without angina pectoris: Secondary | ICD-10-CM | POA: Diagnosis present

## 2019-02-20 DIAGNOSIS — Z803 Family history of malignant neoplasm of breast: Secondary | ICD-10-CM

## 2019-02-20 DIAGNOSIS — B964 Proteus (mirabilis) (morganii) as the cause of diseases classified elsewhere: Secondary | ICD-10-CM | POA: Diagnosis present

## 2019-02-20 DIAGNOSIS — Z7901 Long term (current) use of anticoagulants: Secondary | ICD-10-CM | POA: Diagnosis not present

## 2019-02-20 DIAGNOSIS — S0003XA Contusion of scalp, initial encounter: Secondary | ICD-10-CM | POA: Diagnosis not present

## 2019-02-20 DIAGNOSIS — R278 Other lack of coordination: Secondary | ICD-10-CM | POA: Diagnosis not present

## 2019-02-20 DIAGNOSIS — Z9049 Acquired absence of other specified parts of digestive tract: Secondary | ICD-10-CM

## 2019-02-20 DIAGNOSIS — N390 Urinary tract infection, site not specified: Secondary | ICD-10-CM | POA: Diagnosis not present

## 2019-02-20 DIAGNOSIS — I69320 Aphasia following cerebral infarction: Secondary | ICD-10-CM | POA: Diagnosis not present

## 2019-02-20 DIAGNOSIS — R41841 Cognitive communication deficit: Secondary | ICD-10-CM | POA: Diagnosis not present

## 2019-02-20 DIAGNOSIS — T68XXXA Hypothermia, initial encounter: Secondary | ICD-10-CM | POA: Diagnosis not present

## 2019-02-20 DIAGNOSIS — I517 Cardiomegaly: Secondary | ICD-10-CM | POA: Diagnosis not present

## 2019-02-20 DIAGNOSIS — M25551 Pain in right hip: Secondary | ICD-10-CM | POA: Diagnosis not present

## 2019-02-20 DIAGNOSIS — F05 Delirium due to known physiological condition: Secondary | ICD-10-CM | POA: Diagnosis present

## 2019-02-20 DIAGNOSIS — L89319 Pressure ulcer of right buttock, unspecified stage: Secondary | ICD-10-CM | POA: Diagnosis not present

## 2019-02-20 DIAGNOSIS — Z96642 Presence of left artificial hip joint: Secondary | ICD-10-CM | POA: Diagnosis present

## 2019-02-20 DIAGNOSIS — E039 Hypothyroidism, unspecified: Secondary | ICD-10-CM | POA: Diagnosis present

## 2019-02-20 DIAGNOSIS — R2689 Other abnormalities of gait and mobility: Secondary | ICD-10-CM | POA: Diagnosis not present

## 2019-02-20 LAB — CBC WITH DIFFERENTIAL/PLATELET
Abs Immature Granulocytes: 0.07 10*3/uL (ref 0.00–0.07)
Basophils Absolute: 0.1 10*3/uL (ref 0.0–0.1)
Basophils Relative: 1 %
Eosinophils Absolute: 0.2 10*3/uL (ref 0.0–0.5)
Eosinophils Relative: 2 %
HCT: 44.1 % (ref 36.0–46.0)
Hemoglobin: 15.4 g/dL — ABNORMAL HIGH (ref 12.0–15.0)
Immature Granulocytes: 1 %
Lymphocytes Relative: 12 %
Lymphs Abs: 1.1 10*3/uL (ref 0.7–4.0)
MCH: 32.2 pg (ref 26.0–34.0)
MCHC: 34.9 g/dL (ref 30.0–36.0)
MCV: 92.3 fL (ref 80.0–100.0)
Monocytes Absolute: 0.6 10*3/uL (ref 0.1–1.0)
Monocytes Relative: 7 %
Neutro Abs: 7 10*3/uL (ref 1.7–7.7)
Neutrophils Relative %: 77 %
Platelets: 309 10*3/uL (ref 150–400)
RBC: 4.78 MIL/uL (ref 3.87–5.11)
RDW: 12.4 % (ref 11.5–15.5)
WBC: 9 10*3/uL (ref 4.0–10.5)
nRBC: 0 % (ref 0.0–0.2)

## 2019-02-20 LAB — TSH: TSH: 0.754 u[IU]/mL (ref 0.350–4.500)

## 2019-02-20 LAB — URINALYSIS, ROUTINE W REFLEX MICROSCOPIC
Bilirubin Urine: NEGATIVE
Glucose, UA: NEGATIVE mg/dL
Hgb urine dipstick: NEGATIVE
Ketones, ur: 5 mg/dL — AB
Nitrite: NEGATIVE
Protein, ur: 100 mg/dL — AB
Specific Gravity, Urine: 1.013 (ref 1.005–1.030)
WBC, UA: >50 WBC/hpf — ABNORMAL HIGH (ref 0–5)
pH: 7 (ref 5.0–8.0)

## 2019-02-20 LAB — COMPREHENSIVE METABOLIC PANEL
ALT: 22 U/L (ref 0–44)
AST: 27 U/L (ref 15–41)
Albumin: 3.7 g/dL (ref 3.5–5.0)
Alkaline Phosphatase: 74 U/L (ref 38–126)
Anion gap: 12 (ref 5–15)
BUN: 17 mg/dL (ref 8–23)
CO2: 23 mmol/L (ref 22–32)
Calcium: 8.6 mg/dL — ABNORMAL LOW (ref 8.9–10.3)
Chloride: 101 mmol/L (ref 98–111)
Creatinine, Ser: 0.95 mg/dL (ref 0.44–1.00)
GFR calc Af Amer: 59 mL/min — ABNORMAL LOW (ref >60)
GFR calc non Af Amer: 51 mL/min — ABNORMAL LOW (ref >60)
Glucose, Bld: 166 mg/dL — ABNORMAL HIGH (ref 70–99)
Potassium: 3.5 mmol/L (ref 3.5–5.1)
Sodium: 136 mmol/L (ref 135–145)
Total Bilirubin: 0.9 mg/dL (ref 0.3–1.2)
Total Protein: 6.8 g/dL (ref 6.5–8.1)

## 2019-02-20 LAB — MRSA PCR SCREENING: MRSA by PCR: NEGATIVE

## 2019-02-20 LAB — LACTIC ACID, PLASMA
Lactic Acid, Venous: 1.8 mmol/L (ref 0.5–1.9)
Lactic Acid, Venous: 1.3 mmol/L (ref 0.5–1.9)

## 2019-02-20 LAB — SARS CORONAVIRUS 2 BY RT PCR (HOSPITAL ORDER, PERFORMED IN ~~LOC~~ HOSPITAL LAB): SARS Coronavirus 2: NEGATIVE

## 2019-02-20 LAB — TROPONIN I: Troponin I: <0.03 ng/mL (ref <0.03)

## 2019-02-20 LAB — T4, FREE: Free T4: 1.99 ng/dL — ABNORMAL HIGH (ref 0.82–1.77)

## 2019-02-20 MED ORDER — DOCUSATE SODIUM 100 MG PO CAPS
100.0000 mg | ORAL_CAPSULE | Freq: Every day | ORAL | Status: DC
  Administered 2019-02-20 – 2019-02-22 (×3): 100 mg via ORAL
  Filled 2019-02-20 (×3): qty 1

## 2019-02-20 MED ORDER — IRBESARTAN 150 MG PO TABS
75.0000 mg | ORAL_TABLET | Freq: Every day | ORAL | Status: DC
  Administered 2019-02-21 – 2019-02-22 (×2): 75 mg via ORAL
  Filled 2019-02-20 (×2): qty 1

## 2019-02-20 MED ORDER — SODIUM CHLORIDE 0.9 % IV SOLN
1.0000 g | INTRAVENOUS | Status: DC
  Administered 2019-02-20 – 2019-02-21 (×2): 1 g via INTRAVENOUS
  Filled 2019-02-20: qty 10
  Filled 2019-02-20: qty 1
  Filled 2019-02-20: qty 10

## 2019-02-20 MED ORDER — PANTOPRAZOLE SODIUM 40 MG PO TBEC
40.0000 mg | DELAYED_RELEASE_TABLET | Freq: Every day | ORAL | Status: DC
  Administered 2019-02-21 – 2019-02-22 (×2): 40 mg via ORAL
  Filled 2019-02-20 (×2): qty 1

## 2019-02-20 MED ORDER — SODIUM CHLORIDE 0.9 % IV SOLN
INTRAVENOUS | Status: DC | PRN
  Administered 2019-02-20 (×2): 500 mL via INTRAVENOUS

## 2019-02-20 MED ORDER — CLOPIDOGREL BISULFATE 75 MG PO TABS
75.0000 mg | ORAL_TABLET | Freq: Every day | ORAL | Status: DC
  Administered 2019-02-21 – 2019-02-22 (×2): 75 mg via ORAL
  Filled 2019-02-20 (×2): qty 1

## 2019-02-20 MED ORDER — APIXABAN 5 MG PO TABS
5.0000 mg | ORAL_TABLET | Freq: Two times a day (BID) | ORAL | Status: DC
  Administered 2019-02-20 – 2019-02-22 (×4): 5 mg via ORAL
  Filled 2019-02-20 (×4): qty 1

## 2019-02-20 MED ORDER — ACETAMINOPHEN 325 MG PO TABS
650.0000 mg | ORAL_TABLET | Freq: Four times a day (QID) | ORAL | Status: DC | PRN
  Administered 2019-02-21: 13:00:00 650 mg via ORAL
  Filled 2019-02-20: qty 2

## 2019-02-20 MED ORDER — LEVOTHYROXINE SODIUM 25 MCG PO TABS
125.0000 ug | ORAL_TABLET | Freq: Every day | ORAL | Status: DC
  Administered 2019-02-21 – 2019-02-22 (×2): 125 ug via ORAL
  Filled 2019-02-20 (×2): qty 1

## 2019-02-20 MED ORDER — VANCOMYCIN HCL IN DEXTROSE 1-5 GM/200ML-% IV SOLN
1000.0000 mg | Freq: Once | INTRAVENOUS | Status: AC
  Administered 2019-02-20: 17:00:00 1000 mg via INTRAVENOUS
  Filled 2019-02-20: qty 200

## 2019-02-20 MED ORDER — ONDANSETRON HCL 4 MG/2ML IJ SOLN
4.0000 mg | Freq: Once | INTRAMUSCULAR | Status: AC
  Administered 2019-02-20: 13:00:00 4 mg via INTRAVENOUS
  Filled 2019-02-20: qty 2

## 2019-02-20 MED ORDER — SODIUM CHLORIDE 0.9 % IV BOLUS
1000.0000 mL | Freq: Once | INTRAVENOUS | Status: AC
  Administered 2019-02-20: 15:00:00 1000 mL via INTRAVENOUS

## 2019-02-20 MED ORDER — DONEPEZIL HCL 10 MG PO TABS
10.0000 mg | ORAL_TABLET | Freq: Every day | ORAL | Status: DC
  Administered 2019-02-21 – 2019-02-22 (×2): 10 mg via ORAL
  Filled 2019-02-20 (×2): qty 1

## 2019-02-20 MED ORDER — ACETAMINOPHEN 650 MG RE SUPP: 650.0000 mg | Freq: Four times a day (QID) | RECTAL | Status: DC | PRN

## 2019-02-20 MED ORDER — PIPERACILLIN-TAZOBACTAM 3.375 G IVPB 30 MIN
3.3750 g | Freq: Once | INTRAVENOUS | Status: AC
  Administered 2019-02-20: 15:00:00 3.375 g via INTRAVENOUS
  Filled 2019-02-20: qty 50

## 2019-02-20 MED ORDER — PRAVASTATIN SODIUM 20 MG PO TABS
10.0000 mg | ORAL_TABLET | Freq: Every day | ORAL | Status: DC
  Administered 2019-02-20 – 2019-02-21 (×2): 10 mg via ORAL
  Filled 2019-02-20 (×2): qty 1

## 2019-02-20 NOTE — ED Notes (Signed)
Pt with rectal temp 94.5*F.  MD Schlossman made aware.  Bair hugger applied.

## 2019-02-20 NOTE — ED Provider Notes (Signed)
Care handoff received from [place] PA-C at shift change, please see her note for further details.  In short 85 year old female with history of dementia presenting today after fall at nursing home.  Patient found on the floor, aphasic at baseline.  Patient noted to be hypothermic, unclear if infectious source, broad-spectrum antibiotics ordered, plan of care at shift change is to await remainder of lab work and imaging, plan for admission.  Bruise noted to left side of the head, no other signs of injury present to the back neck chest or abdomen by previous team, previous team did note some pain with lifting the right leg, x-ray ordered by previous team. Physical Exam  BP (!) 122/54   Pulse 60   Temp (S) (!) 94.5 F (34.7 C) (Rectal)   Resp 18   Ht 5\' 4"  (1.626 m)   Wt 65.8 kg   SpO2 96%   BMI 24.89 kg/m   Physical Exam Constitutional:      General: She is not in acute distress.    Appearance: Normal appearance. She is obese. She is not ill-appearing or diaphoretic.  HENT:     Head: Normocephalic.     Jaw: There is normal jaw occlusion.     Right Ear: External ear normal.     Left Ear: External ear normal.     Nose: Nose normal. No rhinorrhea.     Right Nostril: No epistaxis.     Left Nostril: No epistaxis.     Mouth/Throat:     Mouth: Mucous membranes are moist.     Pharynx: Oropharynx is clear.  Eyes:     General: Vision grossly intact. Gaze aligned appropriately.  Neck:     Musculoskeletal: Normal range of motion and neck supple.     Trachea: Trachea and phonation normal. No tracheal tenderness or tracheal deviation.  Cardiovascular:     Rate and Rhythm: Normal rate and regular rhythm.     Heart sounds: Normal heart sounds.  Pulmonary:     Effort: Pulmonary effort is normal. No accessory muscle usage or respiratory distress.     Breath sounds: Normal breath sounds and air entry.  Chest:     Chest wall: No deformity, tenderness or crepitus.     Comments: No sign of  injury to the chest Abdominal:     Palpations: Abdomen is soft.     Tenderness: There is no abdominal tenderness. There is no guarding or rebound.     Comments: No sign of injury to the abdomen  Musculoskeletal:     Comments: Hips stable to compression bilaterally without signs of pain.  Neurological:     Mental Status: She is alert.     Comments: Alert, pleasant, aphasic/baseline per daughter-in-law     ED Course/Procedures   Clinical Course as of Feb 19 1741  Wed Feb 20, 2019  1453 Admission after labs return; hypothermia.   [BM]  1620 Discussed with hospitalist.   [BM]    Clinical Course User Index [BM] Deliah Boston, PA-C    Procedures  MDM  CBC hemoglobin 15.4, nonacute CMP glucose 166, calcium 8.6 nonacute Troponin negative Lactic 1.8 TSH within normal limits EKG: Afib/flut and V-paced complexes No further analysis attempted due to paced rhythm No significant change since last tracing Confirmed by Gareth Morgan  CT head/cervical spine:  IMPRESSION:  1. No acute intracranial abnormality.  2. Large amount of left hemispheric encephalomalacia with multiple  old infarcts.  3. Small scalp vertex hematoma without skull  fracture.  4. No acute fracture or static subluxation of the cervical spine.   Chest x-ray:  IMPRESSION:  No acute disease.    Cardiomegaly.    Atherosclerosis.   DG pelvis:  IMPRESSION:  1. No acute right hip fracture. Moderate to advanced right hip joint  degenerative changes.  2. Left hip prosthesis intact and normally located.  3. No definite pelvic fractures.   Free T4 pending Blood cultures pending  3:45 PM: Family updated on lab work and imaging thus far, patient evaluated resting comfortably with bear hugger in place, alert, a phasic baseline per family member at bedside.  Family number states understanding of care plan, admission after remainder of labs return.  Vital signs stable - Urinalysis: Turbid, ketones and  protein present, moderate leukocytes, greater than 50 white blood cells, many bacteria.  Urine culture in process Discussed case with hospitalist service, they will be seeing patient for admission. - Patient reassessed resting comfortably no acute distress vital signs remained stable, daughter-in-law at bedside states understanding of care plan and is agreeable.  Patient has been admitted to hospitalist service for further evaluation and management.   Note: Portions of this report may have been transcribed using voice recognition software. Every effort was made to ensure accuracy; however, inadvertent computerized transcription errors may still be present.   Gari Crown 02/20/19 1743    Charlesetta Shanks, MD 02/20/19 [phone]

## 2019-02-20 NOTE — ED Notes (Signed)
Attempted report to 2W.  No answer.

## 2019-02-20 NOTE — ED Notes (Signed)
Bed: XN23 Expected date:  Expected time:  Means of arrival:  Comments: EMS 85 yo Fall

## 2019-02-20 NOTE — H&P (Signed)
History and Physical    [name] BMW:[id number] DOB: [birth year] DOA: 02/20/2019  PCP: Biagio Borg, MD Patient coming from: [facility]  Chief Complaint: Fall  HPI: [name] is a 85 y.o. female with medical history significant of complete AV block, heart failure, dementia, stroke with resultant aphasia.  Patient presented secondary to being found down at her residence after presumably trying to ambulate on her own with her walker.  Initial concern for possible fracture.  Patient unable to provide history secondary to dementia in addition to a aphasia.  She was found to be hypothermic on arrival to the emergency department.  ED Course: Vitals: Temperature of 94.5 F, normal pulse, normal respirations, normotensive, SPO2 in the mid 90s on room air Labs: Glucose of 166 Imaging: Hip x-ray without acute fracture, CT cervical spine without fracture, CT head without acute process Medications/Course: Vancomycin and Zosyn, normal saline bolus, Zofran  Review of Systems: Review of Systems  Unable to perform ROS: Dementia    Past Medical History:  Diagnosis Date  . Aphasia 01/09/2009  . Arthritis    right hand  . 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 CEREBROVASCULAR ACCIDENT, HX OF 11/20/2007  . CHF (congestive heart failure) ([place])   . Coronary artery disease   . CVA 01/09/2009  . DEMENTIA 05/19/2008  . Dementia ([place])   . DIVERTICULOSIS, COLON 11/20/2007  . FATIGUE 11/20/2007  . Femoral fracture ([place]) 07/01/2013  . GLUCOSE INTOLERANCE 12/06/2010  . HYPERLIPIDEMIA 11/20/2007  . HYPERTENSION 11/20/2007   Echo was done 07/07/11: mod LVH, EF 55-60%, grade 1 diast dysfxn, mild MR, mild LAE, mod TR, PASP 39  . HYPOTHYROIDISM 11/20/2007  . Impaired glucose tolerance 06/03/2011  . IRON DEFICIENCY 12/06/2010  . Pacemaker 2002  . PACEMAKER, PERMANENT 01/09/2009  . TIA 05/14/2003   elevated CE's in setting of TIA and falls in 10/12 - echo normal with no further cardiac w/u pursued  .  TIA (transient ischemic attack) 10/10/2011  . Urinary incontinence 01/09/2009    Past Surgical History:  Procedure Laterality Date  . ABDOMINAL HYSTERECTOMY    . CHOLECYSTECTOMY    . CYSTOCELE REPAIR N/A 11/26/2012   Procedure: Cystocele Repair with Lefort Colpocleisis.  colpectomy Levatorplasty Perineorraphy;  Surgeon: Lovenia Kim, MD;  Location: [facility];  Service: Gynecology;  Laterality: N/A;  Cystocele Repair with Lefort Colpocleisis.  Marland Kitchen HIP ARTHROPLASTY Left 07/02/2013   Procedure: ARTHROPLASTY BIPOLAR HIP;  Surgeon: Mauri Pole, MD;  Location: [facility];  Service: Orthopedics;  Laterality: Left;  . INGUINAL HERNIA REPAIR Right 08/13/2013   Procedure: HERNIA REPAIR INGUINAL INCARCERATED;  Surgeon: Ralene Ok, MD;  Location: [place];  Service: General;  Laterality: Right;  . Medtronic Cynthia dual chamber pacemaker serial number was [id number] H...04/15/2009    . s/p cerebral aneurysm  1991  . s/p pacemaker DDD    . s/p retinal attachment    . s/p thyroidectomy       reports that she has never smoked. She has never used smokeless tobacco. She reports that she does not drink alcohol or use drugs.  Allergies  Allergen Reactions  . Codeine Rash    Family History  Problem Relation Age of Onset  . Cancer Mother        breast cancer   Prior to Admission medications   Medication Sig Start Date End Date Taking? Authorizing Provider  amLODipine (NORVASC) 10 MG tablet TAKE 1 TABLET BY MOUTH DAILY. RESUME IN 3 DAYS IS SYSTOLIC BLOOD PRESSURE  IS GREATER THAN 150. Patient taking differently: Take 10 mg by mouth daily.  10/01/18  Yes Biagio Borg, MD  apixaban (ELIQUIS) 5 MG TABS tablet Take 1 tablet (5 mg total) by mouth 2 (two) times daily. 12/26/18  Yes Love, Ivan Anchors, PA-C  Calcium Carbonate-Vitamin D (CALTRATE 600+D) 600-400 MG-UNIT per tablet Take 1 tablet by mouth at bedtime.    Yes [provider]  clopidogrel (PLAVIX) 75 MG tablet Take 75 mg by mouth daily.   Yes [provider]  docusate sodium (COLACE) 100 MG capsule Take 100 mg by mouth daily.   Yes [provider]  donepezil (ARICEPT) 10 MG tablet TAKE 1 TABLET(10 MG) BY MOUTH DAILY Patient taking differently: Take 10 mg by mouth daily.  10/01/18  Yes Biagio Borg, MD  levothyroxine (SYNTHROID, LEVOTHROID) 125 MCG tablet TAKE 1 TABLET(125 MCG) BY MOUTH DAILY Patient taking differently: Take 125 mcg by mouth daily before breakfast.  10/01/18  Yes Biagio Borg, MD  Multiple Vitamins-Minerals (CENTRUM SILVER 50+WOMEN) TABS Take 1 tablet by mouth daily.   Yes [provider]  pantoprazole (PROTONIX) 40 MG tablet Take 40 mg by mouth daily.   Yes [provider]  pravastatin (PRAVACHOL) 10 MG tablet Take 10 mg by mouth at bedtime.   Yes [provider]  valsartan (DIOVAN) 80 MG tablet Take 80 mg by mouth daily.   Yes [provider]  feeding supplement, ENSURE COMPLETE, (ENSURE COMPLETE) LIQD Take 237 mLs by mouth 2 (two) times daily between meals. Patient not taking: Reported on 02/20/2019 08/23/13   Erby Pian, NP  losartan (COZAAR) 100 MG tablet TAKE 1 TABLET(100 MG) BY MOUTH DAILY Patient not taking: No sig reported 10/01/18   Biagio Borg, MD  pantoprazole sodium (PROTONIX) 40 mg/20 mL PACK Take 20 mLs (40 mg total) by mouth at bedtime. Patient not taking: Reported on 02/20/2019 12/26/18   Love, Ivan Anchors, PA-C  potassium chloride 20 MEQ/15ML (10%) SOLN Take 15 mLs (20 mEq total) by mouth daily. Patient not taking: Reported on 02/20/2019 12/26/18   Bary Leriche, PA-C  pravastatin (PRAVACHOL) 40 MG tablet TAKE 1 TABLET(40 MG) BY MOUTH DAILY Patient not taking: No sig reported 10/01/18   Biagio Borg, MD    Physical Exam:  Physical Exam Constitutional:      General: She is not in acute distress.    Appearance: She is well-developed. She is not diaphoretic.  Eyes:     Conjunctiva/sclera: Conjunctivae normal.     Pupils: Pupils are equal, round, and reactive  to light.  Neck:     Musculoskeletal: Normal range of motion.  Cardiovascular:     Rate and Rhythm: Normal rate and regular rhythm.     Heart sounds: Normal heart sounds. No murmur.  Pulmonary:     Effort: Pulmonary effort is normal. No respiratory distress.     Breath sounds: Normal breath sounds. No wheezing or rales.  Abdominal:     General: Bowel sounds are normal. There is no distension.     Palpations: Abdomen is soft.     Tenderness: There is no abdominal tenderness. There is no guarding or rebound.  Musculoskeletal: Normal range of motion.        General: No tenderness.  Lymphadenopathy:     Cervical: No cervical adenopathy.  Skin:    General: Skin is warm and dry.  Neurological:     Mental Status: She is alert.     Cranial Nerves:  Cranial nerves are intact.     Comments: Oriented to person and place     Labs on Admission: I have personally reviewed following labs and imaging studies  CBC: Recent Labs  Lab 02/20/19 1325  WBC 9.0  NEUTROABS 7.0  HGB 15.4*  HCT 44.1  MCV 92.3  PLT 086    Basic Metabolic Panel: Recent Labs  Lab 02/20/19 1325  NA 136  K 3.5  CL 101  CO2 23  GLUCOSE 166*  BUN 17  CREATININE 0.95  CALCIUM 8.6*    GFR: Estimated Creatinine Clearance: 33.8 mL/min (by C-G formula based on SCr of 0.95 mg/dL).  Liver Function Tests: Recent Labs  Lab 02/20/19 1325  AST 27  ALT 22  ALKPHOS 74  BILITOT 0.9  PROT 6.8  ALBUMIN 3.7   No results for input(s): LIPASE, AMYLASE in the last 168 hours. No results for input(s): AMMONIA in the last 168 hours.  Coagulation Profile: No results for input(s): INR, PROTIME in the last 168 hours.  Cardiac Enzymes: Recent Labs  Lab 02/20/19 1325  TROPONINI <0.03    BNP (last 3 results) No results for input(s): PROBNP in the last 8760 hours.  HbA1C: No results for input(s): HGBA1C in the last 72 hours.  CBG: No results for input(s): GLUCAP in the last 168 hours.  Lipid Profile: No  results for input(s): CHOL, HDL, LDLCALC, TRIG, CHOLHDL, LDLDIRECT in the last 72 hours.  Thyroid Function Tests: Recent Labs    02/20/19 1447  TSH 0.754  FREET4 1.99*    Anemia Panel: No results for input(s): VITAMINB12, FOLATE, FERRITIN, TIBC, IRON, RETICCTPCT in the last 72 hours.  Urine analysis:    Component Value Date/Time   COLORURINE YELLOW 02/20/2019 1447   APPEARANCEUR TURBID (A) 02/20/2019 1447   LABSPEC 1.013 02/20/2019 1447   PHURINE 7.0 02/20/2019 1447   GLUCOSEU NEGATIVE 02/20/2019 1447   GLUCOSEU NEGATIVE 06/21/2018 1202   HGBUR NEGATIVE 02/20/2019 1447   BILIRUBINUR NEGATIVE 02/20/2019 1447   KETONESUR 5 (A) 02/20/2019 1447   PROTEINUR 100 (A) 02/20/2019 1447   UROBILINOGEN 0.2 06/21/2018 1202   NITRITE NEGATIVE 02/20/2019 1447   LEUKOCYTESUR MODERATE (A) 02/20/2019 1447     Radiological Exams on Admission: Ct Head Wo Contrast  Result Date: 02/20/2019 CLINICAL DATA:  Mechanical fall EXAM: CT HEAD WITHOUT CONTRAST CT CERVICAL SPINE WITHOUT CONTRAST TECHNIQUE: Multidetector CT imaging of the head and cervical spine was performed following the standard protocol without intravenous contrast. Multiplanar CT image reconstructions of the cervical spine were also generated. COMPARISON:  Head CT 01/25/2019 FINDINGS: CT HEAD FINDINGS Brain: Extensive encephalomalacia throughout the left hemisphere involving the left anterior and middle cerebral artery territories. Multiple old small vessel infarcts. The distribution of lesions is unchanged. No intracranial hemorrhage. No midline shift or other mass effect. Vascular: Aneurysm clip at the left MCA bifurcation. Extensive carotid and vertebral atherosclerosis. Skull: Remote left anterior craniotomy. Small hematoma at the scalp vertex. No skull fracture. Sinuses/Orbits: No fluid levels or advanced mucosal thickening of the visualized paranasal sinuses. No mastoid or middle ear effusion. The orbits are normal. CT CERVICAL SPINE  FINDINGS Alignment: No static subluxation. Facets are aligned. Occipital condyles are normally positioned. Skull base and vertebrae: No acute fracture. Soft tissues and spinal canal: No prevertebral fluid or swelling. No visible canal hematoma. Disc levels: Multilevel mild degenerative disc disease without bony spinal canal stenosis. Upper chest: No pneumothorax, pulmonary nodule or pleural effusion. Other: Normal visualized paraspinal cervical soft tissues. IMPRESSION: 1.  No acute intracranial abnormality. 2. Large amount of left hemispheric encephalomalacia with multiple old infarcts. 3. Small scalp vertex hematoma without skull fracture. 4. No acute fracture or static subluxation of the cervical spine. Electronically Signed   By: Ulyses Jarred M.D.   On: 02/20/2019 14:44   Ct Cervical Spine Wo Contrast  Result Date: 02/20/2019 CLINICAL DATA:  Mechanical fall EXAM: CT HEAD WITHOUT CONTRAST CT CERVICAL SPINE WITHOUT CONTRAST TECHNIQUE: Multidetector CT imaging of the head and cervical spine was performed following the standard protocol without intravenous contrast. Multiplanar CT image reconstructions of the cervical spine were also generated. COMPARISON:  Head CT 01/25/2019 FINDINGS: CT HEAD FINDINGS Brain: Extensive encephalomalacia throughout the left hemisphere involving the left anterior and middle cerebral artery territories. Multiple old small vessel infarcts. The distribution of lesions is unchanged. No intracranial hemorrhage. No midline shift or other mass effect. Vascular: Aneurysm clip at the left MCA bifurcation. Extensive carotid and vertebral atherosclerosis. Skull: Remote left anterior craniotomy. Small hematoma at the scalp vertex. No skull fracture. Sinuses/Orbits: No fluid levels or advanced mucosal thickening of the visualized paranasal sinuses. No mastoid or middle ear effusion. The orbits are normal. CT CERVICAL SPINE FINDINGS Alignment: No static subluxation. Facets are aligned. Occipital  condyles are normally positioned. Skull base and vertebrae: No acute fracture. Soft tissues and spinal canal: No prevertebral fluid or swelling. No visible canal hematoma. Disc levels: Multilevel mild degenerative disc disease without bony spinal canal stenosis. Upper chest: No pneumothorax, pulmonary nodule or pleural effusion. Other: Normal visualized paraspinal cervical soft tissues. IMPRESSION: 1. No acute intracranial abnormality. 2. Large amount of left hemispheric encephalomalacia with multiple old infarcts. 3. Small scalp vertex hematoma without skull fracture. 4. No acute fracture or static subluxation of the cervical spine. Electronically Signed   By: Ulyses Jarred M.D.   On: 02/20/2019 14:44   Dg Chest Port 1 View  Result Date: 02/20/2019 CLINICAL DATA:  Hypothermia.  Altered mental status. EXAM: PORTABLE CHEST 1 VIEW COMPARISON:  Single-view of the chest 12/05/2018. FINDINGS: The lungs are clear. There is cardiomegaly. Pacing device is in place. Atherosclerosis noted. No pneumothorax or pleural effusion. No acute or focal bony abnormality. IMPRESSION: No acute disease. Cardiomegaly. Atherosclerosis. Electronically Signed   By: Inge Rise M.D.   On: 02/20/2019 15:41   Dg Hip Unilat With Pelvis 2-3 Views Right  Result Date: 02/20/2019 CLINICAL DATA:  [place].  Right hip pain. EXAM: DG HIP (WITH OR WITHOUT PELVIS) 2-3V RIGHT COMPARISON:  None. FINDINGS: The right hip is normally located. Moderate to advanced degenerative changes with joint space narrowing, osteophytic spurring and subchondral cystic change. No definite acute fracture or plain film evidence of AVN. There is a left hip hemiarthroplasty without complicating features. The pubic symphysis and SI joints are intact. Mild degenerative changes. No definite pelvic fractures. IMPRESSION: 1. No acute right hip fracture. Moderate to advanced right hip joint degenerative changes. 2. Left hip prosthesis intact and normally located. 3. No  definite pelvic fractures. Electronically Signed   By: Marijo Sanes M.D.   On: 02/20/2019 14:48    EKG: Independently reviewed. LBBB. Paced. Unchanged  Assessment/Plan Principal Problem:   Hypothermia Active Problems:   Hypothyroidism   Dementia (H[facility])   Essential hypertension   AV BLOCK, COMPLETE   PACEMAKER, PERMANENT   Atrial fibrillation ([facility])   Global aphasia   Hypothermia No definite etiology but possibly infectious. TSH checked and is normal. Patient found down at her facility. Requiring [facility] in the ED. No  symptoms per patient, however, she has baseline cognitive impairment. UDS suggests possible infection. S/p Vancomycin and Zosyn in the ED -Stepdown; wean off Bair Hugger -Will start Ceftriaxone IV empirically -Follow-up urine/blood culture  Fall Presumed secondary to underlying weakness but this was unwitnessed.  Patient reports no symptoms.  Per family, report with regard to her pacemaker was battery is low without mention of arrhythmias. -PT eval  History of stroke Global aphasia Recent left frontal/ACA territory infarct in 11/2018 with resultant right sided deficits. She underwent inpatient rehabilitation followed by SNF rehabilitation. -Dysphagia 3 diet -Continue Pravastatin, Eliquis and Plavix -PT eval  Essential hypertension -Continue ARB  Atrial fibrillation Rate controlled -Continue Eliquis  Hypothyroidism TSH wnl. -Continue Synthroid  Complete heart block S/p PPM. Per family report, batteries will only last the next month -Outpatient EP follow-up  Dementia Patient has issues with sundowning. -Continue Aricept   DVT prophylaxis: Eliquis Code Status: Full code Family Communication: Daughter in Sports coach at bedside; son (POA) on telephone Disposition Plan: Stepdown unit Consults called: None Admission status: Inpatient   Cordelia Poche, MD [facility] 02/20/2019, 5:44 PM  If 7PM-7AM, please contact night-coverage [email or web address]  Password [email or web address]

## 2019-02-20 NOTE — ED Notes (Signed)
Attempted to call report but no answer.

## 2019-02-20 NOTE — ED Notes (Signed)
Attempting to call report but no answer.

## 2019-02-20 NOTE — ED Provider Notes (Signed)
[facility] DEPT Provider Note   CSN: [id number] Arrival date & time: 02/20/19  1158    History   Chief Complaint Chief Complaint  Patient presents with   Fall    HPI [name] is a 85 y.o. female.     HPI  Patient is a 85 year old female with history of aphasia, CVA, CHF, dementia, hyperlipidemia, hypertension, hypothyroidism, pacemaker, who presents emergency department today for evaluation of a fall that occurred prior to arrival.  Patient lives at [facility].  Staff reports that she was possible using her walker but she was not.  She fell onto the right side.  Staff stated patient was at her mental baseline.  She was given fentanyl in route by EMS.  Level 5 caveat as patient has history of dementia and all is also aphasic.  Past Medical History:  Diagnosis Date   Aphasia 01/09/2009   Arthritis    right hand   AV BLOCK, COMPLETE 01/09/2009   CEREBROVASCULAR ACCIDENT, HX OF 11/20/2007   CHF (congestive heart failure) (HCC)    Coronary artery disease    CVA 01/09/2009   DEMENTIA 05/19/2008   Dementia ([place])    DIVERTICULOSIS, COLON 11/20/2007   FATIGUE 11/20/2007   Femoral fracture ([place]) 07/01/2013   GLUCOSE INTOLERANCE 12/06/2010   HYPERLIPIDEMIA 11/20/2007   HYPERTENSION 11/20/2007   Echo was done 07/07/11: mod LVH, EF 55-60%, grade 1 diast dysfxn, mild MR, mild LAE, mod TR, PASP 39   HYPOTHYROIDISM 11/20/2007   Impaired glucose tolerance 06/03/2011   IRON DEFICIENCY 12/06/2010   Pacemaker 2002   PACEMAKER, PERMANENT 01/09/2009   TIA 05/14/2003   elevated CE's in setting of TIA and falls in 10/12 - echo normal with no further cardiac w/u pursued   TIA (transient ischemic attack) 10/10/2011   Urinary incontinence 01/09/2009    Patient Active Problem List   Diagnosis Date Noted   Dysphagia, post-stroke    Global aphasia    Acute right ACA stroke ([place]) 12/07/2018   Stroke (cerebrum) ([place]) 12/03/2018    Cough 10/11/2018   Abnormal breath sounds 10/11/2018   Closed nondisplaced fracture of proximal phalanx of lesser toe of right foot 06/26/2018   Right ankle pain 06/21/2018   Right foot pain 06/21/2018   Abnormal CXR 04/26/2017   Lobar pneumonia ([place]) 01/20/2017   Acute on chronic respiratory failure with hypoxia ([place]) 01/17/2017   Acute respiratory failure with hypoxia ([place]) 01/17/2017   DOE (dyspnea on exertion) 10/23/2014   Hyponatremia 10/23/2014   RLL pneumonia ([place]) 10/11/2013   Other specified anemias 10/10/2013   Acute respiratory failure ([place]) 08/15/2013   Aspiration pneumonia ([place]) 08/15/2013   Altered mental status 08/15/2013   Incarcerated femoral hernia 08/14/2013   Esophageal reflux 08/05/2013   Unspecified constipation 07/03/2013   Closed fracture of left hip ([place]) 07/01/2013   Prolapse of vaginal vault after hysterectomy 11/27/2012   Atrial fibrillation ([place]) 10/30/2012   Low back pain 10/26/2012   Syncope 10/26/2012   PNA (pneumonia) 09/26/2012   UTI (lower urinary tract infection) 09/26/2012   Hypokalemia 09/26/2012   Leukocytosis 09/26/2012   Cardiac enzymes elevated 07/25/2011   Impaired glucose tolerance 06/03/2011   Preventative health care 06/03/2011   IRON DEFICIENCY 12/06/2010   AV BLOCK, COMPLETE 01/09/2009   CVA (cerebral vascular accident) ([place]) 01/09/2009   Aphasia 01/09/2009   Urinary incontinence 01/09/2009   PACEMAKER, PERMANENT 01/09/2009   Dementia ([place]) 05/19/2008   Hypothyroidism 11/20/2007   Hyperlipidemia 11/20/2007   Essential hypertension  11/20/2007   DIVERTICULOSIS, COLON 11/20/2007   FATIGUE 11/20/2007   CEREBROVASCULAR ACCIDENT, HX OF 11/20/2007   TIA 05/14/2003    Past Surgical History:  Procedure Laterality Date   ABDOMINAL HYSTERECTOMY     CHOLECYSTECTOMY     CYSTOCELE REPAIR N/A 11/26/2012   Procedure: Cystocele Repair with Lefort Colpocleisis.  colpectomy Levatorplasty  Perineorraphy;  Surgeon: Lovenia Kim, MD;  Location: [facility];  Service: Gynecology;  Laterality: N/A;  Cystocele Repair with Lefort Colpocleisis.   HIP ARTHROPLASTY Left 07/02/2013   Procedure: ARTHROPLASTY BIPOLAR HIP;  Surgeon: Mauri Pole, MD;  Location: [facility];  Service: Orthopedics;  Laterality: Left;   INGUINAL HERNIA REPAIR Right 08/13/2013   Procedure: HERNIA REPAIR INGUINAL INCARCERATED;  Surgeon: Ralene Ok, MD;  Location: [facility];  Service: General;  Laterality: Right;   Medtronic Cynthia dual chamber pacemaker serial number was [id number] H...04/15/2009     s/p cerebral aneurysm  1991   s/p pacemaker DDD     s/p retinal attachment     s/p thyroidectomy       OB History   No obstetric history on file.      Home Medications    Prior to Admission medications   Medication Sig Start Date End Date Taking? Authorizing Provider  amLODipine (NORVASC) 10 MG tablet TAKE 1 TABLET BY MOUTH DAILY. RESUME IN 3 DAYS IS SYSTOLIC BLOOD PRESSURE IS GREATER THAN 150. Patient taking differently: Take 10 mg by mouth daily.  10/01/18  Yes Biagio Borg, MD  apixaban (ELIQUIS) 5 MG TABS tablet Take 1 tablet (5 mg total) by mouth 2 (two) times daily. 12/26/18  Yes Love, Ivan Anchors, PA-C  Calcium Carbonate-Vitamin D (CALTRATE 600+D) 600-400 MG-UNIT per tablet Take 1 tablet by mouth at bedtime.    Yes [provider]  clopidogrel (PLAVIX) 75 MG tablet Take 75 mg by mouth daily.   Yes [provider]  docusate sodium (COLACE) 100 MG capsule Take 100 mg by mouth daily.   Yes [provider]  donepezil (ARICEPT) 10 MG tablet TAKE 1 TABLET(10 MG) BY MOUTH DAILY Patient taking differently: Take 10 mg by mouth daily.  10/01/18  Yes Biagio Borg, MD  levothyroxine (SYNTHROID, LEVOTHROID) 125 MCG tablet TAKE 1 TABLET(125 MCG) BY MOUTH DAILY Patient taking differently: Take 125 mcg by mouth daily before breakfast.  10/01/18  Yes Biagio Borg, MD  Multiple Vitamins-Minerals  (CENTRUM SILVER 50+WOMEN) TABS Take 1 tablet by mouth daily.   Yes [provider]  pantoprazole (PROTONIX) 40 MG tablet Take 40 mg by mouth daily.   Yes [provider]  pravastatin (PRAVACHOL) 10 MG tablet Take 10 mg by mouth at bedtime.   Yes [provider]  valsartan (DIOVAN) 80 MG tablet Take 80 mg by mouth daily.   Yes [provider]  feeding supplement, ENSURE COMPLETE, (ENSURE COMPLETE) LIQD Take 237 mLs by mouth 2 (two) times daily between meals. Patient not taking: Reported on 02/20/2019 08/23/13   Erby Pian, NP  losartan (COZAAR) 100 MG tablet TAKE 1 TABLET(100 MG) BY MOUTH DAILY Patient not taking: No sig reported 10/01/18   Biagio Borg, MD  pantoprazole sodium (PROTONIX) 40 mg/20 mL PACK Take 20 mLs (40 mg total) by mouth at bedtime. Patient not taking: Reported on 02/20/2019 12/26/18   Love, Ivan Anchors, PA-C  potassium chloride 20 MEQ/15ML (10%) SOLN Take 15 mLs (20 mEq total) by mouth daily. Patient not taking: Reported on 02/20/2019 12/26/18   Bary Leriche,  PA-C  pravastatin (PRAVACHOL) 40 MG tablet TAKE 1 TABLET(40 MG) BY MOUTH DAILY Patient not taking: No sig reported 10/01/18   Biagio Borg, MD    Family History Family History  Problem Relation Age of Onset   Cancer Mother        breast cancer    Social History Social History   Tobacco Use   Smoking status: Never Smoker   Smokeless tobacco: Never Used  Substance Use Topics   Alcohol use: No   Drug use: No     Allergies   Codeine   Review of Systems Review of Systems  Unable to perform ROS: Dementia (aphasia)  Constitutional:       Mechanical fall     Physical Exam Updated Vital Signs BP (!) 112/53    Pulse 62    Temp (S) (!) 94.5 F (34.7 C) (Rectal)    Resp 18    SpO2 94%   Physical Exam Vitals signs and nursing note reviewed.  Constitutional:      General: She is not in acute distress.    Appearance: She is well-developed.  HENT:     Head:  Normocephalic.     Comments: Old bruising noted to the left side of the face and neck. Eyes:     Conjunctiva/sclera: Conjunctivae normal.  Neck:     Musculoskeletal: Neck supple.  Cardiovascular:     Rate and Rhythm: Normal rate and regular rhythm.     Heart sounds: No murmur.  Pulmonary:     Effort: Pulmonary effort is normal. No respiratory distress.     Breath sounds: Normal breath sounds.  Abdominal:     General: Bowel sounds are normal. There is no distension.     Palpations: Abdomen is soft.     Tenderness: There is no abdominal tenderness. There is no guarding or rebound.  Musculoskeletal:     Comments: No TTP to the cervical, thoracic or lumbar spine. Grimace when lifting the RLE. No significant tenderness to the right hip.   Skin:    General: Skin is warm and dry.  Neurological:     Mental Status: She is alert.     Comments: Mostly aphasic but able to answer some questions and say some phrases. Alert. Moving all extremities. No facial droop. Clear speech. No tongue deviation. Normal strength bilaterally.   Psychiatric:     Comments: Pleasantly demented     ED Treatments / Results  Labs (all labs ordered are listed, but only abnormal results are displayed) Labs Reviewed  CBC WITH DIFFERENTIAL/PLATELET - Abnormal; Notable for the following components:      Result Value   Hemoglobin 15.4 (*)    All other components within normal limits  COMPREHENSIVE METABOLIC PANEL - Abnormal; Notable for the following components:   Glucose, Bld 166 (*)    Calcium 8.6 (*)    GFR calc non Af Amer 51 (*)    GFR calc Af Amer 59 (*)    All other components within normal limits  CULTURE, BLOOD (ROUTINE X 2)  CULTURE, BLOOD (ROUTINE X 2)  URINE CULTURE  SARS CORONAVIRUS 2 (HOSPITAL ORDER, [facility])  TROPONIN I  URINALYSIS, ROUTINE W REFLEX MICROSCOPIC  LACTIC ACID, PLASMA  LACTIC ACID, PLASMA  TSH  T4, FREE    EKG EKG  Interpretation  Date/Time:  Wednesday Feb 20 2019 12:58:55 EDT Ventricular Rate:  60 PR Interval:    QRS Duration: 184 QT Interval:  570 QTC  Calculation: 570 R Axis:   -100 Text Interpretation:  Afib/flut and V-paced complexes No further analysis attempted due to paced rhythm No significant change since last tracing Confirmed by Gareth Morgan [phone]) on 02/20/2019 1:02:30 PM   Radiology Ct Head Wo Contrast  Result Date: 02/20/2019 CLINICAL DATA:  Mechanical fall EXAM: CT HEAD WITHOUT CONTRAST CT CERVICAL SPINE WITHOUT CONTRAST TECHNIQUE: Multidetector CT imaging of the head and cervical spine was performed following the standard protocol without intravenous contrast. Multiplanar CT image reconstructions of the cervical spine were also generated. COMPARISON:  Head CT 01/25/2019 FINDINGS: CT HEAD FINDINGS Brain: Extensive encephalomalacia throughout the left hemisphere involving the left anterior and middle cerebral artery territories. Multiple old small vessel infarcts. The distribution of lesions is unchanged. No intracranial hemorrhage. No midline shift or other mass effect. Vascular: Aneurysm clip at the left MCA bifurcation. Extensive carotid and vertebral atherosclerosis. Skull: Remote left anterior craniotomy. Small hematoma at the scalp vertex. No skull fracture. Sinuses/Orbits: No fluid levels or advanced mucosal thickening of the visualized paranasal sinuses. No mastoid or middle ear effusion. The orbits are normal. CT CERVICAL SPINE FINDINGS Alignment: No static subluxation. Facets are aligned. Occipital condyles are normally positioned. Skull base and vertebrae: No acute fracture. Soft tissues and spinal canal: No prevertebral fluid or swelling. No visible canal hematoma. Disc levels: Multilevel mild degenerative disc disease without bony spinal canal stenosis. Upper chest: No pneumothorax, pulmonary nodule or pleural effusion. Other: Normal visualized paraspinal cervical soft tissues.  IMPRESSION: 1. No acute intracranial abnormality. 2. Large amount of left hemispheric encephalomalacia with multiple old infarcts. 3. Small scalp vertex hematoma without skull fracture. 4. No acute fracture or static subluxation of the cervical spine. Electronically Signed   By: Ulyses Jarred M.D.   On: 02/20/2019 14:44   Ct Cervical Spine Wo Contrast  Result Date: 02/20/2019 CLINICAL DATA:  Mechanical fall EXAM: CT HEAD WITHOUT CONTRAST CT CERVICAL SPINE WITHOUT CONTRAST TECHNIQUE: Multidetector CT imaging of the head and cervical spine was performed following the standard protocol without intravenous contrast. Multiplanar CT image reconstructions of the cervical spine were also generated. COMPARISON:  Head CT 01/25/2019 FINDINGS: CT HEAD FINDINGS Brain: Extensive encephalomalacia throughout the left hemisphere involving the left anterior and middle cerebral artery territories. Multiple old small vessel infarcts. The distribution of lesions is unchanged. No intracranial hemorrhage. No midline shift or other mass effect. Vascular: Aneurysm clip at the left MCA bifurcation. Extensive carotid and vertebral atherosclerosis. Skull: Remote left anterior craniotomy. Small hematoma at the scalp vertex. No skull fracture. Sinuses/Orbits: No fluid levels or advanced mucosal thickening of the visualized paranasal sinuses. No mastoid or middle ear effusion. The orbits are normal. CT CERVICAL SPINE FINDINGS Alignment: No static subluxation. Facets are aligned. Occipital condyles are normally positioned. Skull base and vertebrae: No acute fracture. Soft tissues and spinal canal: No prevertebral fluid or swelling. No visible canal hematoma. Disc levels: Multilevel mild degenerative disc disease without bony spinal canal stenosis. Upper chest: No pneumothorax, pulmonary nodule or pleural effusion. Other: Normal visualized paraspinal cervical soft tissues. IMPRESSION: 1. No acute intracranial abnormality. 2. Large amount of  left hemispheric encephalomalacia with multiple old infarcts. 3. Small scalp vertex hematoma without skull fracture. 4. No acute fracture or static subluxation of the cervical spine. Electronically Signed   By: Ulyses Jarred M.D.   On: 02/20/2019 14:44   Dg Hip Unilat With Pelvis 2-3 Views Right  Result Date: 02/20/2019 CLINICAL DATA:  [place].  Right hip pain. EXAM: DG HIP (WITH OR  WITHOUT PELVIS) 2-3V RIGHT COMPARISON:  None. FINDINGS: The right hip is normally located. Moderate to advanced degenerative changes with joint space narrowing, osteophytic spurring and subchondral cystic change. No definite acute fracture or plain film evidence of AVN. There is a left hip hemiarthroplasty without complicating features. The pubic symphysis and SI joints are intact. Mild degenerative changes. No definite pelvic fractures. IMPRESSION: 1. No acute right hip fracture. Moderate to advanced right hip joint degenerative changes. 2. Left hip prosthesis intact and normally located. 3. No definite pelvic fractures. Electronically Signed   By: Marijo Sanes M.D.   On: 02/20/2019 14:48    Procedures Procedures (including critical care time)  Medications Ordered in ED Medications  sodium chloride 0.9 % bolus 1,000 mL (has no administration in time range)  vancomycin (VANCOCIN) IVPB 1000 mg/200 mL premix (has no administration in time range)  piperacillin-tazobactam (ZOSYN) IVPB 3.375 g (has no administration in time range)  ondansetron (ZOFRAN) injection 4 mg (4 mg Intravenous Given 02/20/19 1326)     Initial Impression / Assessment and Plan / ED Course  I have reviewed the triage vital signs and the nursing notes.  Pertinent labs & imaging results that were available during my care of the patient were reviewed by me and considered in my medical decision making (see chart for details).  Clinical Course as of Feb 20 1515  Wed Feb 20, 2019  1453 Admission after labs return; hypothermia.   [BM]    Clinical  Course User Index [BM] Deliah Boston, PA-C     Final Clinical Impressions(s) / ED Diagnoses   Final diagnoses:  Hypothermia, initial encounter  Fall, initial encounter   85 year old female presenting to the emergency department after mechanical fall at her facility prior to arrival.  Staff state the patient was not using her walker when she should have been and fell onto her right side.    On evaluation, patient noted to have a temperature of 94.5 Fahrenheit, otherwise her vital signs are stable.  She has grimacing pain when lifting the right lower extremity otherwise has no midline tenderness, chest or abdominal tenderness.  She does have some old bruising to the left side of her head.  She portably has had frequent falls recently.  Due to her hypothermia, blood cultures, TSH, lactic acid, and CXR were added.  She was also given broad-spectrum antibiotics and fluids.  She was placed in a bear hugger.  CBC with no leukocytosis, mildly elevated hemoglobin CMP is reassuring Troponin negative  CT head with no acute intracranial abnormality.  Large left hemispheric encephalomalacia with multiple old infarcts.  Small hematoma to the scalp without skull fracture.  CT cervical spine negative  X-ray of the pelvis and right hip without evidence of acute hip fracture.  Does have degenerative changes to the right hip joint.  Pelvic fractures identified.  At shift change, care signed out to [facility] [place].  Plan is to follow-up on pending lactic acid, TSH, free T4 and UA.  Patient will require admission following these lab studies for her hypothermia.  Patient was seen in conjunction with Dr. Billy Fischer who personally evaluated the patient is in agreement this plan.  ED Discharge Orders    None       Bishop Dublin 02/20/19 1516    Gareth Morgan, MD 02/24/19 [phone]

## 2019-02-20 NOTE — ED Notes (Signed)
ED TO INPATIENT HANDOFF REPORT  ED Nurse Name and Phone #: Judye Bos Name/Age/Gender [name] 85 y.o. female Room/Bed: WA14/WA14  Code Status   Code Status: Prior  Home/SNF/Other Nursing Home Patient oriented to: Pt is not oriented x4, hx dementia Is this baseline? Yes   Triage Complete: Triage complete  Chief Complaint fall  Triage Note Pt BIBA from [facility].  Staff reports she supposed to use walker, she was not.  Pt had mechanical fall, landed on right side.  Pt with grimacing facial expression when attempting to move right leg.  Staff found pt laying on floor.  Staff reports pt is alert to baseline.   EMS gave 200 mcg fentanyl total, last dose given at 1130, 4 mg Zofran given at 1134.   Allergies Allergies  Allergen Reactions  . Codeine Rash    Level of Care/Admitting Diagnosis ED Disposition    ED Disposition Condition [facility] Area: [place] [100102]  Level of Care: Stepdown [14]  Admit to SDU based on following criteria: Hemodynamic compromise or significant risk of instability:  Patient requiring short term acute titration and management of vasoactive drips, and invasive monitoring (i.e., CVP and Arterial line).  Covid Evaluation: Screening Protocol (No Symptoms)  Diagnosis: Hypothermia [563149]  Admitting Physician: Mariel Aloe [phone]  Attending Physician: Mariel Aloe [phone]  Estimated length of stay: past midnight tomorrow  Certification:: I certify this patient will need inpatient services for at least 2 midnights  PT Class (Do Not Modify): Inpatient [101]  PT Acc Code (Do Not Modify): Private [1]       B Medical/Surgery History Past Medical History:  Diagnosis Date  . Aphasia 01/09/2009  . Arthritis    right hand  . 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 CEREBROVASCULAR ACCIDENT, HX OF 11/20/2007  . CHF (congestive heart failure) ([place])   . Coronary artery disease   . CVA  01/09/2009  . DEMENTIA 05/19/2008  . Dementia ([place])   . DIVERTICULOSIS, COLON 11/20/2007  . FATIGUE 11/20/2007  . Femoral fracture ([place]) 07/01/2013  . GLUCOSE INTOLERANCE 12/06/2010  . HYPERLIPIDEMIA 11/20/2007  . HYPERTENSION 11/20/2007   Echo was done 07/07/11: mod LVH, EF 55-60%, grade 1 diast dysfxn, mild MR, mild LAE, mod TR, PASP 39  . HYPOTHYROIDISM 11/20/2007  . Impaired glucose tolerance 06/03/2011  . IRON DEFICIENCY 12/06/2010  . Pacemaker 2002  . PACEMAKER, PERMANENT 01/09/2009  . TIA 05/14/2003   elevated CE's in setting of TIA and falls in 10/12 - echo normal with no further cardiac w/u pursued  . TIA (transient ischemic attack) 10/10/2011  . Urinary incontinence 01/09/2009   Past Surgical History:  Procedure Laterality Date  . ABDOMINAL HYSTERECTOMY    . CHOLECYSTECTOMY    . CYSTOCELE REPAIR N/A 11/26/2012   Procedure: Cystocele Repair with Lefort Colpocleisis.  colpectomy Levatorplasty Perineorraphy;  Surgeon: Lovenia Kim, MD;  Location: [facility];  Service: Gynecology;  Laterality: N/A;  Cystocele Repair with Lefort Colpocleisis.  Marland Kitchen HIP ARTHROPLASTY Left 07/02/2013   Procedure: ARTHROPLASTY BIPOLAR HIP;  Surgeon: Mauri Pole, MD;  Location: [facility];  Service: Orthopedics;  Laterality: Left;  . INGUINAL HERNIA REPAIR Right 08/13/2013   Procedure: HERNIA REPAIR INGUINAL INCARCERATED;  Surgeon: Ralene Ok, MD;  Location: [facility];  Service: General;  Laterality: Right;  . Medtronic Cynthia dual chamber pacemaker serial number was [id number]...04/15/2009    . s/p cerebral aneurysm  1991  . s/p pacemaker  DDD    . s/p retinal attachment    . s/p thyroidectomy       A IV Location/Drains/Wounds Patient Lines/Drains/Airways Status   Active Line/Drains/Airways    Name:   Placement date:   Placement time:   Site:   Days:   Peripheral IV 02/20/19 Right Antecubital   02/20/19    -    Antecubital   less than 1   External Urinary Catheter   12/06/18    1900    -   76           Intake/Output Last 24 hours  Intake/Output Summary (Last 24 hours) at 02/20/2019 2006 Last data filed at 02/20/2019 1731 Gross per 24 hour  Intake 1250 ml  Output -  Net 1250 ml    Labs/Imaging Results for orders placed or performed during the hospital encounter of 02/20/19 (from the past 48 hour(s))  CBC with Differential     Status: Abnormal   Collection Time: 02/20/19  1:25 PM  Result Value Ref Range   WBC 9.0 4.0 - 10.5 K/uL   RBC 4.78 3.87 - 5.11 MIL/uL   Hemoglobin 15.4 (H) 12.0 - 15.0 g/dL   HCT 44.1 36.0 - 46.0 %   MCV 92.3 80.0 - 100.0 fL   MCH 32.2 26.0 - 34.0 pg   MCHC 34.9 30.0 - 36.0 g/dL   RDW 12.4 11.5 - 15.5 %   Platelets 309 150 - 400 K/uL   nRBC 0.0 0.0 - 0.2 %   Neutrophils Relative % 77 %   Neutro Abs 7.0 1.7 - 7.7 K/uL   Lymphocytes Relative 12 %   Lymphs Abs 1.1 0.7 - 4.0 K/uL   Monocytes Relative 7 %   Monocytes Absolute 0.6 0.1 - 1.0 K/uL   Eosinophils Relative 2 %   Eosinophils Absolute 0.2 0.0 - 0.5 K/uL   Basophils Relative 1 %   Basophils Absolute 0.1 0.0 - 0.1 K/uL   Immature Granulocytes 1 %   Abs Immature Granulocytes 0.07 0.00 - 0.07 K/uL    Comment: Performed at [facility], [place] [address], [place], [place] [address]  Comprehensive metabolic panel     Status: Abnormal   Collection Time: 02/20/19  1:25 PM  Result Value Ref Range   Sodium 136 135 - 145 mmol/L   Potassium 3.5 3.5 - 5.1 mmol/L   Chloride 101 98 - 111 mmol/L   CO2 23 22 - 32 mmol/L   Glucose, Bld 166 (H) 70 - 99 mg/dL   BUN 17 8 - 23 mg/dL   Creatinine, Ser 0.95 0.44 - 1.00 mg/dL   Calcium 8.6 (L) 8.9 - 10.3 mg/dL   Total Protein 6.8 6.5 - 8.1 g/dL   Albumin 3.7 3.5 - 5.0 g/dL   AST 27 15 - 41 U/L   ALT 22 0 - 44 U/L   Alkaline Phosphatase 74 38 - 126 U/L   Total Bilirubin 0.9 0.3 - 1.2 mg/dL   GFR calc non Af Amer 51 (L) >60 mL/min   GFR calc Af Amer 59 (L) >60 mL/min   Anion gap 12 5 - 15    Comment: Performed at [facility], [place] [address] [place], [place] [address]  Troponin I - ONCE - STAT     Status: None   Collection Time: 02/20/19  1:25 PM  Result Value Ref Range   Troponin I <0.03 <0.03 ng/mL    Comment: Performed at [facility],  [place] [address] [place], [place] [address]  Lactic acid, plasma     Status: None   Collection Time: 02/20/19  2:46 PM  Result Value Ref Range   Lactic Acid, Venous 1.8 0.5 - 1.9 mmol/L    Comment: Performed at [facility], [place] [address] [place], [place] [address]  Urinalysis, Routine w reflex microscopic     Status: Abnormal   Collection Time: 02/20/19  2:47 PM  Result Value Ref Range   Color, Urine YELLOW YELLOW   APPearance TURBID (A) CLEAR   Specific Gravity, Urine 1.013 1.005 - 1.030   pH 7.0 5.0 - 8.0   Glucose, UA NEGATIVE NEGATIVE mg/dL   Hgb urine dipstick NEGATIVE NEGATIVE   Bilirubin Urine NEGATIVE NEGATIVE   Ketones, ur 5 (A) NEGATIVE mg/dL   Protein, ur 100 (A) NEGATIVE mg/dL   Nitrite NEGATIVE NEGATIVE   Leukocytes,Ua MODERATE (A) NEGATIVE   RBC / HPF 0-5 0 - 5 RBC/hpf   WBC, UA >50 (H) 0 - 5 WBC/hpf   Bacteria, UA MANY (A) NONE SEEN    Comment: Performed at [facility], [place] [address], [place], [place] [address]  TSH     Status: None   Collection Time: 02/20/19  2:47 PM  Result Value Ref Range   TSH 0.754 0.350 - 4.500 uIU/mL    Comment: Performed by a 3rd Generation assay with a functional sensitivity of <=0.01 uIU/mL. Performed at [facility], [place] [address], [place], [place] [address]   T4, free     Status: Abnormal   Collection Time: 02/20/19  2:47 PM  Result Value Ref Range   Free T4 1.99 (H) 0.82 - 1.77 ng/dL    Comment: (NOTE) Biotin ingestion may interfere with free T4 tests. If the results are inconsistent with the TSH level, previous test results, or the clinical presentation, then consider biotin interference. If needed, order repeat testing  after stopping biotin. Performed at [facility], [place] [address] [place], [place] [address]   SARS Coronavirus 2 (CEPHEID - Performed in [facility] hospital lab), Hosp Order     Status: None   Collection Time: 02/20/19  2:47 PM  Result Value Ref Range   SARS Coronavirus 2 NEGATIVE NEGATIVE    Comment: (NOTE) If result is NEGATIVE SARS-CoV-2 target nucleic acids are NOT DETECTED. The SARS-CoV-2 RNA is generally detectable in upper and lower  respiratory specimens during the acute phase of infection. The lowest  concentration of SARS-CoV-2 viral copies this assay can detect is 250  copies / mL. A negative result does not preclude SARS-CoV-2 infection  and should not be used as the sole basis for treatment or other  patient management decisions.  A negative result may occur with  improper specimen collection / handling, submission of specimen other  than nasopharyngeal swab, presence of viral mutation(s) within the  areas targeted by this assay, and inadequate number of viral copies  (<250 copies / mL). A negative result must be combined with clinical  observations, patient history, and epidemiological information. If result is POSITIVE SARS-CoV-2 target nucleic acids are DETECTED. The SARS-CoV-2 RNA is generally detectable in upper and lower  respiratory specimens dur ing the acute phase of infection.  Positive  results are indicative of active infection with SARS-CoV-2.  Clinical  correlation with patient history and other diagnostic information is  necessary to determine patient infection status.  Positive results do  not rule out bacterial infection or co-infection  with other viruses. If result is PRESUMPTIVE POSTIVE SARS-CoV-2 nucleic acids MAY BE PRESENT.   A presumptive positive result was obtained on the submitted specimen  and confirmed on repeat testing.  While 2019 novel coronavirus  (SARS-CoV-2) nucleic acids may be present in the submitted sample  additional  confirmatory testing may be necessary for epidemiological  and / or clinical management purposes  to differentiate between  SARS-CoV-2 and other Sarbecovirus currently known to infect humans.  If clinically indicated additional testing with an alternate test  methodology [phone]) is advised. The SARS-CoV-2 RNA is generally  detectable in upper and lower respiratory sp ecimens during the acute  phase of infection. The expected result is Negative. Fact Sheet for Patients:  StrictlyIdeas.no Fact Sheet for Healthcare Providers: BankingDealers.co.za This test is not yet approved or cleared by the [place] FDA and has been authorized for detection and/or diagnosis of SARS-CoV-2 by FDA under an Emergency Use Authorization (EUA).  This EUA will remain in effect (meaning this test can be used) for the duration of the COVID-19 declaration under Section 564(b)(1) of the Act, 21 U.S.C. section 360bbb-3(b)(1), unless the authorization is terminated or revoked sooner. Performed at [facility], [place] [address] [place], [place] [address]   Lactic acid, plasma     Status: None   Collection Time: 02/20/19  5:03 PM  Result Value Ref Range   Lactic Acid, Venous 1.3 0.5 - 1.9 mmol/L    Comment: Performed at [facility], [place] [address] [place], [place] [address]   Ct Head Wo Contrast  Result Date: 02/20/2019 CLINICAL DATA:  Mechanical fall EXAM: CT HEAD WITHOUT CONTRAST CT CERVICAL SPINE WITHOUT CONTRAST TECHNIQUE: Multidetector CT imaging of the head and cervical spine was performed following the standard protocol without intravenous contrast. Multiplanar CT image reconstructions of the cervical spine were also generated. COMPARISON:  Head CT 01/25/2019 FINDINGS: CT HEAD FINDINGS Brain: Extensive encephalomalacia throughout the left hemisphere involving the left anterior and middle cerebral artery territories.  Multiple old small vessel infarcts. The distribution of lesions is unchanged. No intracranial hemorrhage. No midline shift or other mass effect. Vascular: Aneurysm clip at the left MCA bifurcation. Extensive carotid and vertebral atherosclerosis. Skull: Remote left anterior craniotomy. Small hematoma at the scalp vertex. No skull fracture. Sinuses/Orbits: No fluid levels or advanced mucosal thickening of the visualized paranasal sinuses. No mastoid or middle ear effusion. The orbits are normal. CT CERVICAL SPINE FINDINGS Alignment: No static subluxation. Facets are aligned. Occipital condyles are normally positioned. Skull base and vertebrae: No acute fracture. Soft tissues and spinal canal: No prevertebral fluid or swelling. No visible canal hematoma. Disc levels: Multilevel mild degenerative disc disease without bony spinal canal stenosis. Upper chest: No pneumothorax, pulmonary nodule or pleural effusion. Other: Normal visualized paraspinal cervical soft tissues. IMPRESSION: 1. No acute intracranial abnormality. 2. Large amount of left hemispheric encephalomalacia with multiple old infarcts. 3. Small scalp vertex hematoma without skull fracture. 4. No acute fracture or static subluxation of the cervical spine. Electronically Signed   By: Ulyses Jarred M.D.   On: 02/20/2019 14:44   Ct Cervical Spine Wo Contrast  Result Date: 02/20/2019 CLINICAL DATA:  Mechanical fall EXAM: CT HEAD WITHOUT CONTRAST CT CERVICAL SPINE WITHOUT CONTRAST TECHNIQUE: Multidetector CT imaging of the head and cervical spine was performed following the standard protocol without intravenous contrast. Multiplanar CT image reconstructions of the cervical spine were also generated. COMPARISON:  Head CT 01/25/2019 FINDINGS: CT HEAD FINDINGS Brain: Extensive encephalomalacia  throughout the left hemisphere involving the left anterior and middle cerebral artery territories. Multiple old small vessel infarcts. The distribution of lesions is  unchanged. No intracranial hemorrhage. No midline shift or other mass effect. Vascular: Aneurysm clip at the left MCA bifurcation. Extensive carotid and vertebral atherosclerosis. Skull: Remote left anterior craniotomy. Small hematoma at the scalp vertex. No skull fracture. Sinuses/Orbits: No fluid levels or advanced mucosal thickening of the visualized paranasal sinuses. No mastoid or middle ear effusion. The orbits are normal. CT CERVICAL SPINE FINDINGS Alignment: No static subluxation. Facets are aligned. Occipital condyles are normally positioned. Skull base and vertebrae: No acute fracture. Soft tissues and spinal canal: No prevertebral fluid or swelling. No visible canal hematoma. Disc levels: Multilevel mild degenerative disc disease without bony spinal canal stenosis. Upper chest: No pneumothorax, pulmonary nodule or pleural effusion. Other: Normal visualized paraspinal cervical soft tissues. IMPRESSION: 1. No acute intracranial abnormality. 2. Large amount of left hemispheric encephalomalacia with multiple old infarcts. 3. Small scalp vertex hematoma without skull fracture. 4. No acute fracture or static subluxation of the cervical spine. Electronically Signed   By: Ulyses Jarred M.D.   On: 02/20/2019 14:44   Dg Chest Port 1 View  Result Date: 02/20/2019 CLINICAL DATA:  Hypothermia.  Altered mental status. EXAM: PORTABLE CHEST 1 VIEW COMPARISON:  Single-view of the chest 12/05/2018. FINDINGS: The lungs are clear. There is cardiomegaly. Pacing device is in place. Atherosclerosis noted. No pneumothorax or pleural effusion. No acute or focal bony abnormality. IMPRESSION: No acute disease. Cardiomegaly. Atherosclerosis. Electronically Signed   By: Inge Rise M.D.   On: 02/20/2019 15:41   Dg Hip Unilat With Pelvis 2-3 Views Right  Result Date: 02/20/2019 CLINICAL DATA:  [place].  Right hip pain. EXAM: DG HIP (WITH OR WITHOUT PELVIS) 2-3V RIGHT COMPARISON:  None. FINDINGS: The right hip is normally  located. Moderate to advanced degenerative changes with joint space narrowing, osteophytic spurring and subchondral cystic change. No definite acute fracture or plain film evidence of AVN. There is a left hip hemiarthroplasty without complicating features. The pubic symphysis and SI joints are intact. Mild degenerative changes. No definite pelvic fractures. IMPRESSION: 1. No acute right hip fracture. Moderate to advanced right hip joint degenerative changes. 2. Left hip prosthesis intact and normally located. 3. No definite pelvic fractures. Electronically Signed   By: Marijo Sanes M.D.   On: 02/20/2019 14:48    Pending Labs Unresulted Labs (From admission, onward)    Start     Ordered   02/20/19 1359  Urine culture  ONCE - STAT,   STAT     02/20/19 1359   02/20/19 1341  Blood culture (routine x 2)  BLOOD CULTURE X 2,   STAT     02/20/19 1340   Signed and Held  Basic metabolic panel  Tomorrow morning,   R     Signed and Held   Signed and Held  CBC  Tomorrow morning,   R     Signed and Held          Vitals/Pain Today's Vitals   02/20/19 1900 02/20/19 1901 02/20/19 1930 02/20/19 1936  BP: 106/90  (!) 116/54 (!) 120/59  Pulse: 69  (!) 58 68  Resp: (!) 21  19 17   Temp:  (!) 96.6 F (35.9 C)  (!) 97.3 F (36.3 C)  TempSrc:  Rectal  Oral  SpO2: 91%  93% 95%  Weight:      Height:  Isolation Precautions No active isolations  Medications Medications  0.9 %  sodium chloride infusion (500 mLs Intravenous New Bag/Given 02/20/19 1942)  cefTRIAXone (ROCEPHIN) 1 g in sodium chloride 0.9 % 100 mL IVPB (1 g Intravenous New Bag/Given 02/20/19 1944)  ondansetron (ZOFRAN) injection 4 mg (4 mg Intravenous Given 02/20/19 1326)  sodium chloride 0.9 % bolus 1,000 mL (0 mLs Intravenous Stopped 02/20/19 1625)  vancomycin (VANCOCIN) IVPB 1000 mg/200 mL premix (0 mg Intravenous Stopped 02/20/19 1731)  piperacillin-tazobactam (ZOSYN) IVPB 3.375 g (0 g Intravenous Stopped 02/20/19 1618)     Mobility walks with device High fall risk   Focused Assessments Pulmonary Assessment Handoff:  Lung sounds:         ,    R Recommendations: See Admitting Provider Note  Report given to:   Additional Notes: NA

## 2019-02-20 NOTE — Telephone Encounter (Signed)
Spoke with pt daughter in law regarding ERI alert (<1-6 months), will schedule monthly battery checks. All concerns addressed, no further questions at this time.

## 2019-02-20 NOTE — ED Triage Notes (Addendum)
Pt BIBA from [place] [facility].  Staff reports she supposed to use walker, she was not.  Pt had mechanical fall, landed on right side.  Pt with grimacing facial expression when attempting to move right leg.  Staff found pt laying on floor.  Staff reports pt is alert to baseline.   EMS gave 200 mcg fentanyl total, last dose given at 1130, 4 mg Zofran given at 1134.

## 2019-02-20 NOTE — ED Notes (Signed)
Family at bedside. 

## 2019-02-20 NOTE — ED Notes (Signed)
Purewick applied to pt in attempt to get urine sample.

## 2019-02-20 NOTE — Telephone Encounter (Signed)
Spoke with the patient's daughter in law Wells Guiles ([place] verified) and she stated that her mother in law is currently resides in [place]. She stated that she would love to do a doxy.me visit but the nursing home is on lockdown. She stated that she would give [place] a call back to let us know how to proceed. Office number was provided.

## 2019-02-20 NOTE — Progress Notes (Signed)
A consult was received from an ED physician for vancomycin and zosyn  per pharmacy dosing.  The patient's profile has been reviewed for ht/wt/allergies/indication/available labs.    A one time order has been placed for vancomycin 1000 mg Iv and zosyn 3.375 gm IV over 30 min.  Further antibiotics/pharmacy consults should be ordered by admitting physician if indicated.                       Thank you, Lynelle Doctor 02/20/2019  2:50 PM

## 2019-02-21 ENCOUNTER — Other Ambulatory Visit: Payer: Self-pay

## 2019-02-21 DIAGNOSIS — I4821 Permanent atrial fibrillation: Secondary | ICD-10-CM

## 2019-02-21 DIAGNOSIS — Z950 Presence of cardiac pacemaker: Secondary | ICD-10-CM

## 2019-02-21 DIAGNOSIS — R4701 Aphasia: Secondary | ICD-10-CM

## 2019-02-21 DIAGNOSIS — T68XXXA Hypothermia, initial encounter: Secondary | ICD-10-CM

## 2019-02-21 DIAGNOSIS — L89152 Pressure ulcer of sacral region, stage 2: Secondary | ICD-10-CM | POA: Insufficient documentation

## 2019-02-21 DIAGNOSIS — I10 Essential (primary) hypertension: Secondary | ICD-10-CM

## 2019-02-21 DIAGNOSIS — E039 Hypothyroidism, unspecified: Secondary | ICD-10-CM

## 2019-02-21 LAB — BASIC METABOLIC PANEL
Anion gap: 8 (ref 5–15)
BUN: 14 mg/dL (ref 8–23)
CO2: 22 mmol/L (ref 22–32)
Calcium: 7.9 mg/dL — ABNORMAL LOW (ref 8.9–10.3)
Chloride: 107 mmol/L (ref 98–111)
Creatinine, Ser: 0.84 mg/dL (ref 0.44–1.00)
GFR calc Af Amer: >60 mL/min (ref >60)
GFR calc non Af Amer: 59 mL/min — ABNORMAL LOW (ref >60)
Glucose, Bld: 103 mg/dL — ABNORMAL HIGH (ref 70–99)
Potassium: 3.4 mmol/L — ABNORMAL LOW (ref 3.5–5.1)
Sodium: 137 mmol/L (ref 135–145)

## 2019-02-21 LAB — CBC
HCT: 38.7 % (ref 36.0–46.0)
Hemoglobin: 13 g/dL (ref 12.0–15.0)
MCH: 31.2 pg (ref 26.0–34.0)
MCHC: 33.6 g/dL (ref 30.0–36.0)
MCV: 92.8 fL (ref 80.0–100.0)
Platelets: 282 10*3/uL (ref 150–400)
RBC: 4.17 MIL/uL (ref 3.87–5.11)
RDW: 12.7 % (ref 11.5–15.5)
WBC: 8.2 10*3/uL (ref 4.0–10.5)
nRBC: 0 % (ref 0.0–0.2)

## 2019-02-21 MED ORDER — ORAL CARE MOUTH RINSE
15.0000 mL | Freq: Two times a day (BID) | OROMUCOSAL | Status: DC
  Administered 2019-02-21 – 2019-02-22 (×3): 15 mL via OROMUCOSAL

## 2019-02-21 MED ORDER — CHLORHEXIDINE GLUCONATE CLOTH 2 % EX PADS
6.0000 | MEDICATED_PAD | Freq: Every day | CUTANEOUS | Status: DC
  Administered 2019-02-21: 23:00:00 6 via TOPICAL

## 2019-02-21 NOTE — Progress Notes (Signed)
PROGRESS NOTE    [name]  IOX:[id number] DOB: 09-25-1922 DOA: 02/20/2019 PCP: Biagio Borg, MD   Brief Narrative:  [name] is a pleasant 85 year old gentleman with a past history of complete AV block, heart failure, dementia, stroke with resultant aphasia who was brought into the emergency department on 02/20/2019 after she was found down at her residence after presumably a fall while she was trying to ambulate using her walker.  She has extensive imaging studies including CT of the head, CT cervical spine and x-ray of the hip and there was no fracture found.  She was hypothermic upon presentation so she was admitted under hospitalist service to stepdown unit and needed bare hugger.  Consultants:   None  Procedures:   None  Antimicrobials:   None   Subjective: Patient seen and examined.  She was alert and seemed to be oriented and looked very good.  Of course, due to aphasia, she was not able to communicate much but did not seem uncomfortable either.  Objective: Vitals:   02/21/19 0400 02/21/19 0500 02/21/19 0511 02/21/19 0800  BP: (!) 149/50 (!) 143/53    Pulse: 61 68 60   Resp: 17 (!) 29 10   Temp: 97.6 F (36.4 C)   97.8 F (36.6 C)  TempSrc: Oral   Oral  SpO2: 100% 98% 99%   Weight:      Height:        Intake/Output Summary (Last 24 hours) at 02/21/2019 0940 Last data filed at 02/21/2019 0500 Gross per 24 hour  Intake 1378.87 ml  Output 200 ml  Net 1178.87 ml   Filed Weights   02/20/19 1517  Weight: 65.8 kg    Examination:  General exam: Appears calm and comfortable  Respiratory system: Clear to auscultation. Respiratory effort normal. Cardiovascular system: S1 & S2 heard, irregularly irregular rate and rhythm. No JVD, murmurs, rubs, gallops or clicks. No pedal edema. Gastrointestinal system: Abdomen is nondistended, soft and nontender. No organomegaly or masses felt. Normal bowel sounds heard. Central nervous system: Alert and unable  to assess orientation due to aphasia. No focal neurological deficits. Extremities: Symmetric 5 x 5 power. Skin: No rashes, lesions or ulcers Psychiatry: Alert and aphasic   Data Reviewed: I have personally reviewed following labs and imaging studies  CBC: Recent Labs  Lab 02/20/19 1325 02/21/19 0255  WBC 9.0 8.2  NEUTROABS 7.0  --   HGB 15.4* 13.0  HCT 44.1 38.7  MCV 92.3 92.8  PLT 309 268   Basic Metabolic Panel: Recent Labs  Lab 02/20/19 1325 02/21/19 0255  NA 136 137  K 3.5 3.4*  CL 101 107  CO2 23 22  GLUCOSE 166* 103*  BUN 17 14  CREATININE 0.95 0.84  CALCIUM 8.6* 7.9*   GFR: Estimated Creatinine Clearance: 38.2 mL/min (by C-G formula based on SCr of 0.84 mg/dL). Liver Function Tests: Recent Labs  Lab 02/20/19 1325  AST 27  ALT 22  ALKPHOS 74  BILITOT 0.9  PROT 6.8  ALBUMIN 3.7   No results for input(s): LIPASE, AMYLASE in the last 168 hours. No results for input(s): AMMONIA in the last 168 hours. Coagulation Profile: No results for input(s): INR, PROTIME in the last 168 hours. Cardiac Enzymes: Recent Labs  Lab 02/20/19 1325  TROPONINI <0.03   BNP (last 3 results) No results for input(s): PROBNP in the last 8760 hours. HbA1C: No results for input(s): HGBA1C in the last 72 hours. CBG: No results for input(s): GLUCAP in  the last 168 hours. Lipid Profile: No results for input(s): CHOL, HDL, LDLCALC, TRIG, CHOLHDL, LDLDIRECT in the last 72 hours. Thyroid Function Tests: Recent Labs    02/20/19 1447  TSH 0.754  FREET4 1.99*   Anemia Panel: No results for input(s): VITAMINB12, FOLATE, FERRITIN, TIBC, IRON, RETICCTPCT in the last 72 hours. Sepsis Labs: Recent Labs  Lab 02/20/19 1446 02/20/19 1703  LATICACIDVEN 1.8 1.3    Recent Results (from the past 240 hour(s))  SARS Coronavirus 2 (CEPHEID - Performed in [facility] hospital lab), Hosp Order     Status: None   Collection Time: 02/20/19  2:47 PM  Result Value Ref Range Status   SARS  Coronavirus 2 NEGATIVE NEGATIVE Final    Comment: (NOTE) If result is NEGATIVE SARS-CoV-2 target nucleic acids are NOT DETECTED. The SARS-CoV-2 RNA is generally detectable in upper and lower  respiratory specimens during the acute phase of infection. The lowest  concentration of SARS-CoV-2 viral copies this assay can detect is 250  copies / mL. A negative result does not preclude SARS-CoV-2 infection  and should not be used as the sole basis for treatment or other  patient management decisions.  A negative result may occur with  improper specimen collection / handling, submission of specimen other  than nasopharyngeal swab, presence of viral mutation(s) within the  areas targeted by this assay, and inadequate number of viral copies  (<250 copies / mL). A negative result must be combined with clinical  observations, patient history, and epidemiological information. If result is POSITIVE SARS-CoV-2 target nucleic acids are DETECTED. The SARS-CoV-2 RNA is generally detectable in upper and lower  respiratory specimens dur ing the acute phase of infection.  Positive  results are indicative of active infection with SARS-CoV-2.  Clinical  correlation with patient history and other diagnostic information is  necessary to determine patient infection status.  Positive results do  not rule out bacterial infection or co-infection with other viruses. If result is PRESUMPTIVE POSTIVE SARS-CoV-2 nucleic acids MAY BE PRESENT.   A presumptive positive result was obtained on the submitted specimen  and confirmed on repeat testing.  While 2019 novel coronavirus  (SARS-CoV-2) nucleic acids may be present in the submitted sample  additional confirmatory testing may be necessary for epidemiological  and / or clinical management purposes  to differentiate between  SARS-CoV-2 and other Sarbecovirus currently known to infect humans.  If clinically indicated additional testing with an alternate test   methodology [phone]) is advised. The SARS-CoV-2 RNA is generally  detectable in upper and lower respiratory sp ecimens during the acute  phase of infection. The expected result is Negative. Fact Sheet for Patients:  StrictlyIdeas.no Fact Sheet for Healthcare Providers: BankingDealers.co.za This test is not yet approved or cleared by the [place] FDA and has been authorized for detection and/or diagnosis of SARS-CoV-2 by FDA under an Emergency Use Authorization (EUA).  This EUA will remain in effect (meaning this test can be used) for the duration of the COVID-19 declaration under Section 564(b)(1) of the Act, 21 U.S.C. section 360bbb-3(b)(1), unless the authorization is terminated or revoked sooner. Performed at [facility], [place] [address] [place], [place] [address]   MRSA PCR Screening     Status: None   Collection Time: 02/20/19  9:20 PM  Result Value Ref Range Status   MRSA by PCR NEGATIVE NEGATIVE Final    Comment:        The GeneXpert MRSA Assay (FDA approved for NASAL specimens  only), is one component of a comprehensive MRSA colonization surveillance program. It is not intended to diagnose MRSA infection nor to guide or monitor treatment for MRSA infections. Performed at [facility], [place] [address] [place], [place] [address]       Radiology Studies: Ct Head Wo Contrast  Result Date: 02/20/2019 CLINICAL DATA:  Mechanical fall EXAM: CT HEAD WITHOUT CONTRAST CT CERVICAL SPINE WITHOUT CONTRAST TECHNIQUE: Multidetector CT imaging of the head and cervical spine was performed following the standard protocol without intravenous contrast. Multiplanar CT image reconstructions of the cervical spine were also generated. COMPARISON:  Head CT 01/25/2019 FINDINGS: CT HEAD FINDINGS Brain: Extensive encephalomalacia throughout the left hemisphere involving the left anterior and middle cerebral  artery territories. Multiple old small vessel infarcts. The distribution of lesions is unchanged. No intracranial hemorrhage. No midline shift or other mass effect. Vascular: Aneurysm clip at the left MCA bifurcation. Extensive carotid and vertebral atherosclerosis. Skull: Remote left anterior craniotomy. Small hematoma at the scalp vertex. No skull fracture. Sinuses/Orbits: No fluid levels or advanced mucosal thickening of the visualized paranasal sinuses. No mastoid or middle ear effusion. The orbits are normal. CT CERVICAL SPINE FINDINGS Alignment: No static subluxation. Facets are aligned. Occipital condyles are normally positioned. Skull base and vertebrae: No acute fracture. Soft tissues and spinal canal: No prevertebral fluid or swelling. No visible canal hematoma. Disc levels: Multilevel mild degenerative disc disease without bony spinal canal stenosis. Upper chest: No pneumothorax, pulmonary nodule or pleural effusion. Other: Normal visualized paraspinal cervical soft tissues. IMPRESSION: 1. No acute intracranial abnormality. 2. Large amount of left hemispheric encephalomalacia with multiple old infarcts. 3. Small scalp vertex hematoma without skull fracture. 4. No acute fracture or static subluxation of the cervical spine. Electronically Signed   By: Ulyses Jarred M.D.   On: 02/20/2019 14:44   Ct Cervical Spine Wo Contrast  Result Date: 02/20/2019 CLINICAL DATA:  Mechanical fall EXAM: CT HEAD WITHOUT CONTRAST CT CERVICAL SPINE WITHOUT CONTRAST TECHNIQUE: Multidetector CT imaging of the head and cervical spine was performed following the standard protocol without intravenous contrast. Multiplanar CT image reconstructions of the cervical spine were also generated. COMPARISON:  Head CT 01/25/2019 FINDINGS: CT HEAD FINDINGS Brain: Extensive encephalomalacia throughout the left hemisphere involving the left anterior and middle cerebral artery territories. Multiple old small vessel infarcts. The distribution  of lesions is unchanged. No intracranial hemorrhage. No midline shift or other mass effect. Vascular: Aneurysm clip at the left MCA bifurcation. Extensive carotid and vertebral atherosclerosis. Skull: Remote left anterior craniotomy. Small hematoma at the scalp vertex. No skull fracture. Sinuses/Orbits: No fluid levels or advanced mucosal thickening of the visualized paranasal sinuses. No mastoid or middle ear effusion. The orbits are normal. CT CERVICAL SPINE FINDINGS Alignment: No static subluxation. Facets are aligned. Occipital condyles are normally positioned. Skull base and vertebrae: No acute fracture. Soft tissues and spinal canal: No prevertebral fluid or swelling. No visible canal hematoma. Disc levels: Multilevel mild degenerative disc disease without bony spinal canal stenosis. Upper chest: No pneumothorax, pulmonary nodule or pleural effusion. Other: Normal visualized paraspinal cervical soft tissues. IMPRESSION: 1. No acute intracranial abnormality. 2. Large amount of left hemispheric encephalomalacia with multiple old infarcts. 3. Small scalp vertex hematoma without skull fracture. 4. No acute fracture or static subluxation of the cervical spine. Electronically Signed   By: Ulyses Jarred M.D.   On: 02/20/2019 14:44   Dg Chest Port 1 View  Result Date: 02/20/2019 CLINICAL DATA:  Hypothermia.  Altered mental  status. EXAM: PORTABLE CHEST 1 VIEW COMPARISON:  Single-view of the chest 12/05/2018. FINDINGS: The lungs are clear. There is cardiomegaly. Pacing device is in place. Atherosclerosis noted. No pneumothorax or pleural effusion. No acute or focal bony abnormality. IMPRESSION: No acute disease. Cardiomegaly. Atherosclerosis. Electronically Signed   By: Inge Rise M.D.   On: 02/20/2019 15:41   Dg Hip Unilat With Pelvis 2-3 Views Right  Result Date: 02/20/2019 CLINICAL DATA:  [place].  Right hip pain. EXAM: DG HIP (WITH OR WITHOUT PELVIS) 2-3V RIGHT COMPARISON:  None. FINDINGS: The right hip  is normally located. Moderate to advanced degenerative changes with joint space narrowing, osteophytic spurring and subchondral cystic change. No definite acute fracture or plain film evidence of AVN. There is a left hip hemiarthroplasty without complicating features. The pubic symphysis and SI joints are intact. Mild degenerative changes. No definite pelvic fractures. IMPRESSION: 1. No acute right hip fracture. Moderate to advanced right hip joint degenerative changes. 2. Left hip prosthesis intact and normally located. 3. No definite pelvic fractures. Electronically Signed   By: Marijo Sanes M.D.   On: 02/20/2019 14:48    Scheduled Meds: . apixaban  5 mg Oral BID  . Chlorhexidine Gluconate Cloth  6 each Topical Q0600  . clopidogrel  75 mg Oral Daily  . docusate sodium  100 mg Oral Daily  . donepezil  10 mg Oral Daily  . irbesartan  75 mg Oral Daily  . levothyroxine  125 mcg Oral QAC breakfast  . mouth rinse  15 mL Mouth Rinse BID  . pantoprazole  40 mg Oral Daily  . pravastatin  10 mg Oral QHS   Continuous Infusions: . sodium chloride Stopped (02/20/19 2056)  . cefTRIAXone (ROCEPHIN)  IV Stopped (02/20/19 2014)     LOS: 1 day   Assessment & Plan:   Principal Problem:   Hypothermia Active Problems:   Hypothyroidism   Dementia ([place])   Essential hypertension   AV BLOCK, COMPLETE   PACEMAKER, PERMANENT   Atrial fibrillation (H[facility])   Global aphasia   Pressure injury of skin  Fall: Likely due to weakness.  No fracture found.  Will consult PT OT to assess her.  Hypothermia: Source unknown but she has improved significantly.  Does not need stepdown unit anymore so we will transfer her to regular floor.  History of stroke with global aphasia: Continue dysphagia 3 diet.  No stroke found on CT yesterday.  Continue rest of the management and consult PT OT for assessment.  Hypertension: Controlled.  Continue current regimen.  Chronic atrial fibrillation: Rate controlled.  Continue  Eliquis.  Hypothyroidism: Continue Synthroid  DVT prophylaxis: Eliquis Code Status: Full code Family Communication: Discussed with patient's son Jacklyne Baik and answered all the questions over the phone. Disposition Plan: Transfer to telemetry floor today, PT OT to see her, if remains a stable then potentially discharge back to skilled nursing facility tomorrow.   Time spent: 30 minutes   Darliss Cheney, MD [facility] Pager [phone]  If 7PM-7AM, please contact night-coverage [email or web address] Password [facility] 02/21/2019, 9:40 AM

## 2019-02-21 NOTE — Evaluation (Signed)
Physical Therapy Evaluation Patient Details Name: [name] MRN: [id number] DOB: Dec 15, 1921 Today's Date: 02/21/2019   History of Present Illness  85 yo female admitted to ED from [place] s/p fall onto R side, CT head - for acute abnormality but pt with small scalp hematoma. xray of pelvis and R hip unremarkable for acute abnormality. Pt with other significant PMH of L frontal/ACA CVA with residual R-sided weakness and global aphasia 11/2018, TIA, pacemaker, HTN, femoral fx s/p L hip arthroplasty, dementia, CAD, CHF, arthritis (particularly in her hands), s/p cerebral aneurysm s/p coiling.    Clinical Impression   Pt presents with RLE pain especially R knee in WB, generalized weakness, difficulty performing mobility tasks, and decreased activity tolerance due to pain and weakness. Pt to benefit from acute PT to address deficits. Pt performed sit to stand x3 with mod-max assist +2 during eval, and was able to take short steps to get to recliner at bedside with mod assist +3 requiring extra person to steady RW. Pt vocalizing in pain with transfer and reaching for R knee. PT went back to pt room to attempt ambulation as pt still stating "I want to walk" when PT left, but pt only able to stand with limited WB on RLE. RN notified and witnessed pt with reluctance to WB on RLE and moderate-sever pain. PT recommending SNF to return pt to PLOF. PT to progress mobility as tolerated, and will continue to follow acutely.      Follow Up Recommendations SNF;Supervision/Assistance - 24 hour    Equipment Recommendations  Other (comment)(defer)    Recommendations for Other Services       Precautions / Restrictions Precautions Precautions: Fall Restrictions Weight Bearing Restrictions: No      Mobility  Bed Mobility Overal bed mobility: Needs Assistance Bed Mobility: Supine to Sit     Supine to sit: HOB elevated;+2 for physical assistance;Mod assist     General bed mobility comments: Mod  assist for trunk elevation, LE management. Pt with good initiation of LLE to EOB, but unable to move RLE without PT assist.   Transfers Overall transfer level: Needs assistance Equipment used: Standard walker Transfers: Sit to/from [employer] Transfers Sit to Stand: Mod assist;+2 physical assistance;+2 safety/equipment;From elevated surface Stand pivot transfers: Mod assist;+2 physical assistance;+2 safety/equipment;From elevated surface(+3, one person steadying walker)       General transfer comment: Sit to stand x3 during PT, twice from bed and once from recliner. Pt requiring max assist +2 for initial stand for power up, steadying, bringing LEs within BOS, trunk support in standing. Pt with heavy posterior leaning with LEs in front of BOS. Pt with mod assist +2 for other stands, still with heavy posterior leaning and pt reluctant to WB on RLE due to pain. Pt vocalizing "ouch" and reaching for RLE during standing. Pt able to stand pivot mod assist +3 for walker management, steadying, trunk support with PT standing posterolateral in case of buckling, and guiding pt to recliner at bedside.   Ambulation/Gait Ambulation/Gait assistance: (Pt motivated to attempt, but unable due to RLE pain in standing, LE weakness)              Stairs            Wheelchair Mobility    Modified Rankin (Stroke Patients Only)       Balance Overall balance assessment: Needs assistance Sitting-balance support: Feet supported;Single extremity supported Sitting balance-[name] Scale: Fair     Standing balance support: Bilateral upper extremity supported;During  functional activity Standing balance-[name] Scale: Zero Standing balance comment: requires +2-3 to stand with RW                             Pertinent Vitals/Pain Pain Assessment: Faces Faces Pain Scale: Hurts even more Pain Descriptors / Indicators: Grimacing;Guarding;Moaning Pain Intervention(s): Limited activity  within patient's tolerance;Monitored during session;Repositioned;RN gave pain meds during session    [place] expects to be discharged to:: Private residence Living Arrangements: Alone Available Help at Discharge: (unsure of assist level, pt unable to tell PT) Type of Home: Independent living facility([facility]) Home Access: Level entry     Home Layout: One level   Additional Comments: Unsure of pt's DME, but chart review reveals pt has RW she typically uses for ambulation, not using RW at time of fall.     Prior Function Level of Independence: Needs assistance   Gait / Transfers Assistance Needed: Pt at least used RW for mobility, unsure of physical assist pt needed for transfers/gait.            Hand Dominance   Dominant Hand: Right    Extremity/Trunk Assessment   Upper Extremity Assessment Upper Extremity Assessment: Defer to OT evaluation    Lower Extremity Assessment Lower Extremity Assessment: Generalized weakness;RLE deficits/detail RLE Deficits / Details: Pt with significant guarding of RLE, able to perform AAROM knee flexion and extension but painful, TTP medial and lateral superior joint line of knee.  RLE: Unable to fully assess due to pain    Cervical / Trunk Assessment Cervical / Trunk Assessment: Normal  Communication   Communication: HOH;Expressive difficulties  Cognition Arousal/Alertness: Awake/alert Behavior During Therapy: Restless(Pt calling out upon PT arrival "I need to move, I want to walk" ) Overall Cognitive Status: History of cognitive impairments - at baseline Area of Impairment: Orientation;Following commands;Safety/judgement;Problem solving                 Orientation Level: Disoriented to;Time;Situation;Place     Following Commands: Follows one step commands consistently Safety/Judgement: Decreased awareness of safety;Decreased awareness of deficits   Problem Solving: Slow processing;Decreased  initiation;Difficulty sequencing;Requires verbal cues;Requires tactile cues General Comments: A&O to self, understands PT commands and able to participate well with PT.       General Comments      Exercises General Exercises - Lower Extremity Heel Slides: AAROM;Both;10 reps;Seated   Assessment/Plan    PT Assessment Patient needs continued PT services  PT Problem List Decreased strength;Decreased mobility;Decreased safety awareness;Decreased range of motion;Decreased cognition;Decreased activity tolerance;Decreased balance;Decreased knowledge of use of DME;Pain       PT Treatment Interventions DME instruction;Functional mobility training;Balance training;Patient/family education;Gait training;Therapeutic activities;Therapeutic exercise;Neuromuscular re-education    PT Goals (Current goals can be found in the Care Plan section)  Acute Rehab PT Goals Patient Stated Goal: "I want to walk"  PT Goal Formulation: With patient Time For Goal Achievement: 03/07/19 Potential to Achieve Goals: Good    Frequency Min 2X/week   Barriers to discharge        Co-evaluation               AM-PAC PT "6 Clicks" Mobility  Outcome Measure Help needed turning from your back to your side while in a flat bed without using bedrails?: A Lot Help needed moving from lying on your back to sitting on the side of a flat bed without using bedrails?: A Lot Help needed moving to and from a bed to  a chair (including a wheelchair)?: Total Help needed standing up from a chair using your arms (e.g., wheelchair or bedside chair)?: A Lot Help needed to walk in hospital room?: Total Help needed climbing 3-5 steps with a railing? : Total 6 Click Score: 9    End of Session Equipment Utilized During Treatment: Gait belt Activity Tolerance: Patient limited by fatigue;Patient limited by pain Patient left: in chair;with chair alarm set;with call bell/phone within reach;with nursing/sitter in room Nurse  Communication: Mobility status PT Visit Diagnosis: History of falling (Z91.81);Muscle weakness (generalized) (M62.81);Difficulty in walking, not elsewhere classified (R26.2)    Time: 2671-2458, 0998-3382 PT Time Calculation (min) (ACUTE ONLY): 31 min   Charges:   PT Evaluation $PT Eval Low Complexity: 1 Low PT Treatments $Neuromuscular Re-education: 8-22 mins        Diamonique Ruedas Conception Chancy, PT Acute Rehabilitation Services Pager [phone]  Office [phone]   Elzie Sheets D Jaylenn Baiza 02/21/2019, 2:11 PM

## 2019-02-21 NOTE — TOC Initial Note (Addendum)
Transition of Care [facility]) - Initial/Assessment Note    Patient Details  Name: [name] MRN: [id number] Date of Birth: October 27, 1921  Transition of Care [facility]) CM/SW Contact:    Nila Nephew, LCSW Phone Number: [phone] 02/21/2019, 11:25 AM  Clinical Narrative:   Pt admitted from [facility] independent living after a fall. Pt recently returned home from SNF 2 weeks ago and prior to that was at [facility] so per son pt has had extensive rehab recently.  Pt has dementia and aphasia, and at baseline per son pt recognizes caretakers, knows surroundings, and can express need for help. Son states [facility] in-home care program ([facility]) provides assistance with pt transfers, bathing/hygeine, prepares and delivers meals, checks on pt hourly, and administers medications. States [facility] provides HHPT/ST/OT. Son states he is in process with [facility] to increase care/supervision plan.  Son states his understanding of pt fall was that pt was "able to access her rollater somehow which she is not supposed to be able to do without assistance, and tried to get to the kitchen." States pt is "pretty willed and independent more so that she can be." Son states part of increased plan is to not have rolater accessible to pt or in her room at all going forward. Pt's son aware therapy evaluations have been consulted in-house and he states he and family are not willing to have pt admit to rehab/SNF again. He states, "Given the current situation with her being isolated there and not having visitors, it was impossible to really be involved and really we saw her regress at rehab, she is less mobile now that when she entered." Son reports that pt returning to [facility] is only plan they would support at this time.  TOC team will follow to assist DC planning.  13:48-update, spoke with Nonah Mattes, RN at [facility] familiar with pt- she and pt's son have been discussing pt care and is available to discuss DC plan. RN was  unable to advise re: exact plan to return [place] until all DC care needs are known, however, she mentions that she and pt's son have discussed the possibility that re: long term care needs, it may be that pt has reached a higher level of need than is going to be able to be managed at [facility] long term.                  Expected Discharge Plan: [facility](Independent Living v. SNF) Barriers to Discharge: Continued Medical Work up   Patient Goals and CMS Choice Patient states their goals for this hospitalization and ongoing recovery are:: pt did not participate- family's goal is to have pt return to [facility] [place] with increased care plan CMS Medicare.gov Compare Post Acute Care list provided to:: Other (Comment Required)(NA) Choice offered to / list presented to : NA  Expected Discharge Plan and Services Expected Discharge Plan: [facility](Independent Living v. SNF) In-house Referral: Clinical Social Work Discharge Planning Services: CM Consult   Living arrangements for the past 2 months: Independent Living Facility(pt's permanent residence is independent living, however pt wwas at SNF for rehab from 12/26/18 until 2 weeks ago and was at [facility] prior to that) Expected Discharge Date: (unknown)                         HH Arranged: (pt has home health with [facility])  Prior Living Arrangements/Services Living arrangements for the past 2 months: Independent Living Facility(pt's permanent residence is independent living, however pt wwas at SNF for rehab from 12/26/18 until 2 weeks ago and was at [facility] prior to that) Lives with:: Facility Resident Patient language and need for interpreter reviewed:: No Do you feel safe going back to the place where you live?: (pt did not participate)      Need for Family Participation in Patient Care: Yes (Comment)(pt has dementia and aphasia ) Care giver support system in place?: Yes (comment)(family and indepedent  living with in-home care and home health) Current home services: DME, Homehealth aide, Home OT, Home PT, Home RN, Housekeeping, Actuary Criminal Activity/Legal Involvement Pertinent to Current Situation/Hospitalization: No - Comment as needed  Activities of Daily Living Home Assistive Devices/Equipment: Blood pressure cuff, Scales, Grab bars around toilet, Grab bars in shower, Hand-held shower hose, Hospital bed, Walker (specify type), Wheelchair, Hearing aid(front wheeled walker, bilateral hearing aids-[facility] has necessary equipment for their residents) ADL Screening (condition at time of admission) Patient's cognitive ability adequate to safely complete daily activities?: No Is the patient deaf or have difficulty hearing?: Yes(wears bilateral hearing aids) Does the patient have difficulty seeing, even when wearing glasses/contacts?: No Does the patient have difficulty concentrating, remembering, or making decisions?: Yes Patient able to express need for assistance with ADLs?: Yes Does the patient have difficulty dressing or bathing?: Yes Independently performs ADLs?: No Communication: Independent Dressing (OT): Needs assistance Is this a change from baseline?: Pre-admission baseline Grooming: Needs assistance Is this a change from baseline?: Pre-admission baseline Feeding: Needs assistance Is this a change from baseline?: Pre-admission baseline Bathing: Needs assistance Is this a change from baseline?: Pre-admission baseline Toileting: Needs assistance Is this a change from baseline?: Pre-admission baseline In/Out Bed: Needs assistance Is this a change from baseline?: Pre-admission baseline Walks in Home: Needs assistance Is this a change from baseline?: Pre-admission baseline Does the patient have difficulty walking or climbing stairs?: Yes Weakness of Legs: Both Weakness of Arms/Hands: None  Permission Sought/Granted Permission sought to share information with : Facility  Sport and exercise psychologist Permission granted to share information with : No  Share Information with NAME: son Juanda Crumble [phone]  Permission granted to share info w AGENCY: [facility], supervisor of [facility] (in-home care services) [phone]        Emotional Assessment Appearance:: Appears stated age     Orientation: : Oriented to Self Alcohol / Substance Use: Not Applicable Psych Involvement: No (comment)  Admission diagnosis:  Hypothermia [T68.XXXA] Pain [R52] Hypothermia, initial encounter [T68.XXXA] Fall, initial encounter [id number]] Urinary tract infection without hematuria, site unspecified [N39.0] Patient Active Problem List   Diagnosis Date Noted  . Pressure injury of skin 02/21/2019  . Hypothermia 02/20/2019  . Dysphagia, post-stroke   . Global aphasia   . Acute right ACA stroke ([place]) 12/07/2018  . Stroke (cerebrum) ([place]) 12/03/2018  . Cough 10/11/2018  . Abnormal breath sounds 10/11/2018  . Closed nondisplaced fracture of proximal phalanx of lesser toe of right foot 06/26/2018  . Right ankle pain 06/21/2018  . Right foot pain 06/21/2018  . Abnormal CXR 04/26/2017  . Lobar pneumonia ([place]) 01/20/2017  . Acute on chronic respiratory failure with hypoxia ([place]) 01/17/2017  . Acute respiratory failure with hypoxia ([place]) 01/17/2017  . DOE (dyspnea on exertion) 10/23/2014  . Hyponatremia 10/23/2014  . RLL pneumonia ([place]) 10/11/2013  . Other specified anemias 10/10/2013  . Acute respiratory failure ([place]) 08/15/2013  . Aspiration pneumonia ([place])  08/15/2013  . Altered mental status 08/15/2013  . Incarcerated femoral hernia 08/14/2013  . Esophageal reflux 08/05/2013  . Unspecified constipation 07/03/2013  . Closed fracture of left hip ([place]) 07/01/2013  . 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 Atrial fibrillation ([place]) 10/30/2012  . Low back pain 10/26/2012  . Syncope 10/26/2012  . PNA (pneumonia) 09/26/2012  . UTI (lower  urinary tract infection) 09/26/2012  . Hypokalemia 09/26/2012  . Leukocytosis 09/26/2012  . Cardiac enzymes elevated 07/25/2011  . Impaired glucose tolerance 06/03/2011  . Preventative health care 06/03/2011  . IRON DEFICIENCY 12/06/2010  . 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 CVA (cerebral vascular accident) ([place]) 01/09/2009  . Aphasia 01/09/2009  . Urinary incontinence 01/09/2009  . PACEMAKER, PERMANENT 01/09/2009  . Dementia ([place]) 05/19/2008  . Hypothyroidism 11/20/2007  . Hyperlipidemia 11/20/2007  . Essential hypertension 11/20/2007  . DIVERTICULOSIS, COLON 11/20/2007  . FATIGUE 11/20/2007  . CEREBROVASCULAR ACCIDENT, HX OF 11/20/2007  . TIA 05/14/2003   PCP:  Biagio Borg, MD Pharmacy:   [facility] [place], [place] [address] [place] [address] Phone: [phone] Fax: [phone]     Social Determinants of Health (SDOH) Interventions    Readmission Risk Interventions No flowsheet data found.

## 2019-02-21 NOTE — Plan of Care (Signed)
  Problem: Education: Goal: Knowledge of General Education information will improve Description Including pain rating scale, medication(s)/side effects and non-pharmacologic comfort measures Outcome: Progressing   

## 2019-02-22 DIAGNOSIS — L89319 Pressure ulcer of right buttock, unspecified stage: Secondary | ICD-10-CM

## 2019-02-22 DIAGNOSIS — N390 Urinary tract infection, site not specified: Principal | ICD-10-CM

## 2019-02-22 LAB — COMPREHENSIVE METABOLIC PANEL
ALT: 18 U/L (ref 0–44)
AST: 24 U/L (ref 15–41)
Albumin: 3.1 g/dL — ABNORMAL LOW (ref 3.5–5.0)
Alkaline Phosphatase: 67 U/L (ref 38–126)
Anion gap: 8 (ref 5–15)
BUN: 8 mg/dL (ref 8–23)
CO2: 23 mmol/L (ref 22–32)
Calcium: 8.1 mg/dL — ABNORMAL LOW (ref 8.9–10.3)
Chloride: 104 mmol/L (ref 98–111)
Creatinine, Ser: 0.66 mg/dL (ref 0.44–1.00)
GFR calc Af Amer: >60 mL/min (ref >60)
GFR calc non Af Amer: >60 mL/min (ref >60)
Glucose, Bld: 116 mg/dL — ABNORMAL HIGH (ref 70–99)
Potassium: 3.4 mmol/L — ABNORMAL LOW (ref 3.5–5.1)
Sodium: 135 mmol/L (ref 135–145)
Total Bilirubin: 0.5 mg/dL (ref 0.3–1.2)
Total Protein: 5.9 g/dL — ABNORMAL LOW (ref 6.5–8.1)

## 2019-02-22 LAB — URINE CULTURE: Culture: >=100000 — AB

## 2019-02-22 MED ORDER — CEPHALEXIN 250 MG PO CAPS: 250.0000 mg | ORAL_CAPSULE | Freq: Four times a day (QID) | ORAL | 0 refills | Status: AC

## 2019-02-22 NOTE — TOC Progression Note (Signed)
Transition of Care [facility]) - Progression Note    Patient Details  Name: [name] MRN: [id number] Date of Birth: 10/29/1921  Transition of Care [facility]) CM/SW Contact  Taquisha Phung, Juliann Pulse, RN Phone Number: 02/22/2019, 1:44 PM  Clinical Narrative: Faxed d/c summary, & HHC orders w/confirmation to [facility].     Expected Discharge Plan: [facility] [place] [facility](Independent Living v. SNF) Barriers to Discharge: No Barriers Identified  Expected Discharge Plan and Services Expected Discharge Plan: [facility](Independent Living v. SNF) In-house Referral: Clinical Social Work Discharge Planning Services: CM Consult   Living arrangements for the past 2 months: Independent Living Facility(pt's permanent residence is independent living, however pt wwas at SNF for rehab from 12/26/18 until 2 weeks ago and was at [facility] prior to that) Expected Discharge Date: 02/22/19                         HH Arranged: (pt has home health with [facility])           Social Determinants of Health (SDOH) Interventions    Readmission Risk Interventions No flowsheet data found.

## 2019-02-22 NOTE — Care Management Important Message (Signed)
Important Message  Patient Details IM Letter given to Dessa Phi RN to present to the Patient Name: [name] MRN: [id number] Date of Birth: [birth year]   Medicare Important Message Given:  Yes    Kerin Salen 02/22/2019, 11:45 AMImportant Message  Patient Details  Name: [name] MRN: [id number] Date of Birth: 12-07-1921   Medicare Important Message Given:  Yes    Kerin Salen 02/22/2019, 11:45 AM

## 2019-02-22 NOTE — TOC Transition Note (Signed)
Transition of Care [facility]) - CM/SW Discharge Note   Patient Details  Name: MALAYZIA LAFORTE MRN: [id number] Date of Birth: 03/18/1922  Transition of Care [facility]) CM/SW Contact:  Dessa Phi, RN Phone Number: 02/22/2019, 1:26 PM   Clinical Narrative:Spoke to son Juanda Crumble about d/c plans -d/c back to [facility] with [facility]-HHPT/OT/ST;also [facility] home Services-private duty care-custodial level. Juanda Crumble will transport home on own after 3p to have patient ready around 2:30p.TC [facility] Indep liv-rep Otila Kluver aware of return need faxed D/C Summary, & HHC orders to [phone].       Final next level of care: [facility] Barriers to Discharge: No Barriers Identified   Patient Goals and CMS Choice Patient states their goals for this hospitalization and ongoing recovery are:: pt did not participate- family's goal is to have pt return to [facility] [place] with increased care plan CMS Medicare.gov Compare Post Acute Care list provided to:: Patient Represenative (must comment) Choice offered to / list presented to : NA  Discharge Placement                       Discharge Plan and Services In-house Referral: Clinical Social Work Discharge Planning Services: CM Consult                      HH Arranged: (pt has home health with [facility])          Social Determinants of Health (SDOH) Interventions     Readmission Risk Interventions No flowsheet data found.

## 2019-02-22 NOTE — Discharge Summary (Addendum)
Physician Discharge Summary  DEREONA KOLODNY KPT:[id number] DOB: 17-Mar-1922 DOA: 02/20/2019  PCP: Biagio Borg, MD  Admit date: 02/20/2019 Discharge date: 02/22/2019  Admitted From: Independent living facility Disposition: Independent living facility  Recommendations for Outpatient Follow-up:  1. Follow up with PCP in 1-2 weeks 2. Please obtain BMP/CBC in one week 3. Please follow up on the following pending results:  Home Health: Yes Equipment/Devices: None  Discharge Condition: Stable CODE STATUS: Full code Diet recommendation: Cardiac diet  Subjective: Patient seen and examined.  She remains confused at her baseline due to her dementia.  Looks comfortable.  No complaints.  Brief/Interim Summary: [name] is a pleasant 85 year old gentleman with a past history of complete AV block, heart failure, dementia, stroke with resultant aphasia who was brought into the emergency department on 02/20/2019 after she was found down at her residence after presumably a fall while she was trying to ambulate using her walker.  She had extensive imaging studies including CT of the head, CT cervical spine and x-ray of the hip and there was no fracture found.  She was hypothermic upon presentation so she was admitted under hospitalist service to stepdown unit and needed bare hugger.  She was also thought to be having possibly UTI so she was started on IV Rocephin.  Within 24 hours, her hypothermia improved and she was transferred to medical floor and she was evaluated by PT who recommended either skilled nursing facility discharge or discharging home with home health care.  Patient son chose discharging back to her residence with home health care as she already has some care available over there.  Her urine culture grew Proteus mirabilis.  She is going to be discharged on Keflex for 5 days.  She will resume all her home medications and follow with PCP. I discussed plan of care with patient's son  Juanda Crumble over the phone.  Discharge Diagnoses:  Principal Problem:   Hypothermia Active Problems:   Hypothyroidism   Dementia ([place])   Essential hypertension   AV BLOCK, COMPLETE   PACEMAKER, PERMANENT   Acute lower UTI   Atrial fibrillation (HCC)   Global aphasia   Pressure ulcer of sacral region, stage 2 [facility])    Discharge Instructions  Discharge Instructions    Discharge patient   Complete by:  As directed    Discharge disposition:  06-Home-Health Care Svc   Discharge patient date:  02/22/2019     Allergies as of 02/22/2019      Reactions   Codeine Rash      Medication List    TAKE these medications   amLODipine 10 MG tablet Commonly known as:  NORVASC TAKE 1 TABLET BY MOUTH DAILY. RESUME IN 3 DAYS IS SYSTOLIC BLOOD PRESSURE IS GREATER THAN 150. What changed:  See the new instructions.   apixaban 5 MG Tabs tablet Commonly known as:  Eliquis Take 1 tablet (5 mg total) by mouth 2 (two) times daily.   Caltrate 600+D 600-400 MG-UNIT tablet Generic drug:  Calcium Carbonate-Vitamin D Take 1 tablet by mouth at bedtime.   Centrum Silver 50+Women Tabs Take 1 tablet by mouth daily.   cephALEXin 250 MG capsule Commonly known as:  KEFLEX Take 1 capsule (250 mg total) by mouth 4 (four) times daily for 5 days.   clopidogrel 75 MG tablet Commonly known as:  PLAVIX Take 75 mg by mouth daily.   docusate sodium 100 MG capsule Commonly known as:  COLACE Take 100 mg by mouth daily.  donepezil 10 MG tablet Commonly known as:  ARICEPT TAKE 1 TABLET(10 MG) BY MOUTH DAILY What changed:  See the new instructions.   feeding supplement (ENSURE COMPLETE) Liqd Take 237 mLs by mouth 2 (two) times daily between meals.   levothyroxine 125 MCG tablet Commonly known as:  SYNTHROID TAKE 1 TABLET(125 MCG) BY MOUTH DAILY What changed:  See the new instructions.   losartan 100 MG tablet Commonly known as:  COZAAR TAKE 1 TABLET(100 MG) BY MOUTH DAILY   pantoprazole 40 MG  tablet Commonly known as:  PROTONIX Take 40 mg by mouth daily.   pantoprazole sodium 40 mg/20 mL Pack Commonly known as:  PROTONIX Take 20 mLs (40 mg total) by mouth at bedtime.   potassium chloride 20 MEQ/15ML (10%) Soln Take 15 mLs (20 mEq total) by mouth daily.   pravastatin 10 MG tablet Commonly known as:  PRAVACHOL Take 10 mg by mouth at bedtime.   pravastatin 40 MG tablet Commonly known as:  PRAVACHOL TAKE 1 TABLET(40 MG) BY MOUTH DAILY   valsartan 80 MG tablet Commonly known as:  DIOVAN Take 80 mg by mouth daily.      Follow-up Information    Biagio Borg, MD Follow up in 1 week(s).   Specialties:  Internal Medicine, Radiology Contact information: [address] [place] [address] [phone]          Allergies  Allergen Reactions  . Codeine Rash    Consultations: None   Procedures/Studies: Ct Head Wo Contrast  Result Date: 02/20/2019 CLINICAL DATA:  Mechanical fall EXAM: CT HEAD WITHOUT CONTRAST CT CERVICAL SPINE WITHOUT CONTRAST TECHNIQUE: Multidetector CT imaging of the head and cervical spine was performed following the standard protocol without intravenous contrast. Multiplanar CT image reconstructions of the cervical spine were also generated. COMPARISON:  Head CT 01/25/2019 FINDINGS: CT HEAD FINDINGS Brain: Extensive encephalomalacia throughout the left hemisphere involving the left anterior and middle cerebral artery territories. Multiple old small vessel infarcts. The distribution of lesions is unchanged. No intracranial hemorrhage. No midline shift or other mass effect. Vascular: Aneurysm clip at the left MCA bifurcation. Extensive carotid and vertebral atherosclerosis. Skull: Remote left anterior craniotomy. Small hematoma at the scalp vertex. No skull fracture. Sinuses/Orbits: No fluid levels or advanced mucosal thickening of the visualized paranasal sinuses. No mastoid or middle ear effusion. The orbits are normal. CT CERVICAL SPINE  FINDINGS Alignment: No static subluxation. Facets are aligned. Occipital condyles are normally positioned. Skull base and vertebrae: No acute fracture. Soft tissues and spinal canal: No prevertebral fluid or swelling. No visible canal hematoma. Disc levels: Multilevel mild degenerative disc disease without bony spinal canal stenosis. Upper chest: No pneumothorax, pulmonary nodule or pleural effusion. Other: Normal visualized paraspinal cervical soft tissues. IMPRESSION: 1. No acute intracranial abnormality. 2. Large amount of left hemispheric encephalomalacia with multiple old infarcts. 3. Small scalp vertex hematoma without skull fracture. 4. No acute fracture or static subluxation of the cervical spine. Electronically Signed   By: Ulyses Jarred M.D.   On: 02/20/2019 14:44   Ct Head Wo Contrast  Result Date: 01/25/2019 CLINICAL DATA:  Cerebrovascular accident. EXAM: CT HEAD WITHOUT CONTRAST TECHNIQUE: Contiguous axial images were obtained from the base of the skull through the vertex without intravenous contrast. COMPARISON:  CT scan of December 25, 2018. FINDINGS: Brain: Left frontal encephalomalacia is noted consistent with old infarction. Stable large area of encephalomalacia is seen involving left temporal and parietal cortex which is unchanged compared to prior exam  consistent with old infarction. No mass effect or midline shift is noted. Ventricular size is within normal limits. There is no evidence of mass lesion, hemorrhage or acute infarction. Vascular: Atherosclerotic calcifications of supraclinoid internal carotid arteries is noted. Skull: Stable appearance of left frontal craniotomy status post left MCA aneurysm clipping. No acute abnormality is noted. Sinuses/Orbits: No acute finding. Other: None. IMPRESSION: Left frontal encephalomalacia is noted which represents continued evolution of previously noted infarction, with characteristics currently consistent with chronic infarction at this time. Stable  extensive encephalomalacia involving left temporal and parietal cortices is noted consistent with infarction related to prior left MCA aneurysm. Patient is status post aneurysm clipping of left MCA. No acute intracranial abnormality is noted. Electronically Signed   By: Marijo Conception M.D.   On: 01/25/2019 09:35   Ct Cervical Spine Wo Contrast  Result Date: 02/20/2019 CLINICAL DATA:  Mechanical fall EXAM: CT HEAD WITHOUT CONTRAST CT CERVICAL SPINE WITHOUT CONTRAST TECHNIQUE: Multidetector CT imaging of the head and cervical spine was performed following the standard protocol without intravenous contrast. Multiplanar CT image reconstructions of the cervical spine were also generated. COMPARISON:  Head CT 01/25/2019 FINDINGS: CT HEAD FINDINGS Brain: Extensive encephalomalacia throughout the left hemisphere involving the left anterior and middle cerebral artery territories. Multiple old small vessel infarcts. The distribution of lesions is unchanged. No intracranial hemorrhage. No midline shift or other mass effect. Vascular: Aneurysm clip at the left MCA bifurcation. Extensive carotid and vertebral atherosclerosis. Skull: Remote left anterior craniotomy. Small hematoma at the scalp vertex. No skull fracture. Sinuses/Orbits: No fluid levels or advanced mucosal thickening of the visualized paranasal sinuses. No mastoid or middle ear effusion. The orbits are normal. CT CERVICAL SPINE FINDINGS Alignment: No static subluxation. Facets are aligned. Occipital condyles are normally positioned. Skull base and vertebrae: No acute fracture. Soft tissues and spinal canal: No prevertebral fluid or swelling. No visible canal hematoma. Disc levels: Multilevel mild degenerative disc disease without bony spinal canal stenosis. Upper chest: No pneumothorax, pulmonary nodule or pleural effusion. Other: Normal visualized paraspinal cervical soft tissues. IMPRESSION: 1. No acute intracranial abnormality. 2. Large amount of left  hemispheric encephalomalacia with multiple old infarcts. 3. Small scalp vertex hematoma without skull fracture. 4. No acute fracture or static subluxation of the cervical spine. Electronically Signed   By: Ulyses Jarred M.D.   On: 02/20/2019 14:44   Dg Chest Port 1 View  Result Date: 02/20/2019 CLINICAL DATA:  Hypothermia.  Altered mental status. EXAM: PORTABLE CHEST 1 VIEW COMPARISON:  Single-view of the chest 12/05/2018. FINDINGS: The lungs are clear. There is cardiomegaly. Pacing device is in place. Atherosclerosis noted. No pneumothorax or pleural effusion. No acute or focal bony abnormality. IMPRESSION: No acute disease. Cardiomegaly. Atherosclerosis. Electronically Signed   By: Inge Rise M.D.   On: 02/20/2019 15:41   Dg Hip Unilat With Pelvis 2-3 Views Right  Result Date: 02/20/2019 CLINICAL DATA:  [place].  Right hip pain. EXAM: DG HIP (WITH OR WITHOUT PELVIS) 2-3V RIGHT COMPARISON:  None. FINDINGS: The right hip is normally located. Moderate to advanced degenerative changes with joint space narrowing, osteophytic spurring and subchondral cystic change. No definite acute fracture or plain film evidence of AVN. There is a left hip hemiarthroplasty without complicating features. The pubic symphysis and SI joints are intact. Mild degenerative changes. No definite pelvic fractures. IMPRESSION: 1. No acute right hip fracture. Moderate to advanced right hip joint degenerative changes. 2. Left hip prosthesis intact and normally located. 3. No definite pelvic  fractures. Electronically Signed   By: Marijo Sanes M.D.   On: 02/20/2019 14:48     Discharge Exam: Vitals:   02/21/19 2216 02/22/19 0559  BP: 138/64 (!) 148/59  Pulse: 77 61  Resp: 16 16  Temp: 98.7 F (37.1 C) 98.5 F (36.9 C)  SpO2: 90% 94%   Vitals:   02/21/19 1200 02/21/19 1528 02/21/19 2216 02/22/19 0559  BP:  127/60 138/64 (!) 148/59  Pulse:  98 77 61  Resp:  16 16 16   Temp: (!) 97.4 F (36.3 C) 97.8 F (36.6 C) 98.7 F  (37.1 C) 98.5 F (36.9 C)  TempSrc: Oral Oral Oral Oral  SpO2:  97% 90% 94%  Weight:      Height:        General: Pt is alert, awake, not in acute distress Cardiovascular: RRR, S1/S2 +, no rubs, no gallops Respiratory: CTA bilaterally, no wheezing, no rhonchi Abdominal: Soft, NT, ND, bowel sounds + Extremities: no edema, no cyanosis    The results of significant diagnostics from this hospitalization (including imaging, microbiology, ancillary and laboratory) are listed below for reference.     Microbiology: Recent Results (from the past 240 hour(s))  Blood culture (routine x 2)     Status: None (Preliminary result)   Collection Time: 02/20/19  2:45 PM  Result Value Ref Range Status   Specimen Description   Final    BLOOD LEFT ANTECUBITAL Performed at [facility] [address], [place], [place] [address]    Special Requests   Final    BOTTLES DRAWN AEROBIC AND ANAEROBIC Blood Culture adequate volume Performed at [facility] [address], [place], [place] [address]    Culture   Final    NO GROWTH < 24 HOURS Performed at [facility] [address] [place], [place] [address]    Report Status PENDING  Incomplete  Blood culture (routine x 2)     Status: None (Preliminary result)   Collection Time: 02/20/19  2:47 PM  Result Value Ref Range Status   Specimen Description   Final    BLOOD LEFT FOREARM Performed at [facility] [address] [place], [place] [address]    Special Requests   Final    BOTTLES DRAWN AEROBIC AND ANAEROBIC Blood Culture adequate volume Performed at [facility] [address], [place], [place] [address]    Culture   Final    NO GROWTH < 24 HOURS Performed at [facility] [address] [place], [place] [address]    Report Status PENDING  Incomplete  SARS Coronavirus 2 (CEPHEID - Performed in [facility] hospital lab), Hosp Order     Status:  None   Collection Time: 02/20/19  2:47 PM  Result Value Ref Range Status   SARS Coronavirus 2 NEGATIVE NEGATIVE Final    Comment: (NOTE) If result is NEGATIVE SARS-CoV-2 target nucleic acids are NOT DETECTED. The SARS-CoV-2 RNA is generally detectable in upper and lower  respiratory specimens during the acute phase of infection. The lowest  concentration of SARS-CoV-2 viral copies this assay can detect is 250  copies / mL. A negative result does not preclude SARS-CoV-2 infection  and should not be used as the sole basis for treatment or other  patient management decisions.  A negative result may occur with  improper specimen collection / handling, submission of specimen other  than nasopharyngeal swab, presence of viral mutation(s) within the  areas targeted by this assay, and inadequate number of viral copies  (<250 copies / mL). A negative result must be combined with clinical  observations, patient history, and epidemiological information. If result is POSITIVE SARS-CoV-2 target nucleic acids are DETECTED. The SARS-CoV-2 RNA is generally detectable in upper and lower  respiratory specimens dur ing the acute phase of infection.  Positive  results are indicative of active infection with SARS-CoV-2.  Clinical  correlation with patient history and other diagnostic information is  necessary to determine patient infection status.  Positive results do  not rule out bacterial infection or co-infection with other viruses. If result is PRESUMPTIVE POSTIVE SARS-CoV-2 nucleic acids MAY BE PRESENT.   A presumptive positive result was obtained on the submitted specimen  and confirmed on repeat testing.  While 2019 novel coronavirus  (SARS-CoV-2) nucleic acids may be present in the submitted sample  additional confirmatory testing may be necessary for epidemiological  and / or clinical management purposes  to differentiate between  SARS-CoV-2 and other Sarbecovirus currently known to infect  humans.  If clinically indicated additional testing with an alternate test  methodology [phone]) is advised. The SARS-CoV-2 RNA is generally  detectable in upper and lower respiratory sp ecimens during the acute  phase of infection. The expected result is Negative. Fact Sheet for Patients:  StrictlyIdeas.no Fact Sheet for Healthcare Providers: BankingDealers.co.za This test is not yet approved or cleared by the [place] FDA and has been authorized for detection and/or diagnosis of SARS-CoV-2 by FDA under an Emergency Use Authorization (EUA).  This EUA will remain in effect (meaning this test can be used) for the duration of the COVID-19 declaration under Section 564(b)(1) of the Act, 21 U.S.C. section 360bbb-3(b)(1), unless the authorization is terminated or revoked sooner. Performed at [facility], [place] [address] [place], [place] [address]   Urine culture     Status: Abnormal   Collection Time: 02/20/19  2:48 PM  Result Value Ref Range Status   Specimen Description   Final    URINE, RANDOM Performed at [facility] [address], [place], [place] [address]    Special Requests   Final    NONE Performed at [facility], [place] [address], [place], [place] [address]    Culture >=100,000 COLONIES/mL PROTEUS MIRABILIS (A)  Final   Report Status 02/22/2019 FINAL  Final   Organism ID, Bacteria PROTEUS MIRABILIS (A)  Final      Susceptibility   Proteus mirabilis - MIC*    AMPICILLIN <=2 SENSITIVE Sensitive     CEFAZOLIN <=4 SENSITIVE Sensitive     CEFTRIAXONE <=1 SENSITIVE Sensitive     CIPROFLOXACIN 2 INTERMEDIATE Intermediate     GENTAMICIN <=1 SENSITIVE Sensitive     IMIPENEM 1 SENSITIVE Sensitive     NITROFURANTOIN 128 RESISTANT Resistant     TRIMETH/SULFA <=20 SENSITIVE Sensitive     AMPICILLIN/SULBACTAM <=2 SENSITIVE Sensitive     PIP/TAZO <=4 SENSITIVE Sensitive      * >=100,000 COLONIES/mL PROTEUS MIRABILIS  MRSA PCR Screening     Status: None   Collection Time: 02/20/19  9:20 PM  Result Value Ref Range Status   MRSA by PCR NEGATIVE NEGATIVE Final    Comment:        The GeneXpert MRSA Assay (FDA approved for NASAL specimens only), is one component of a comprehensive MRSA colonization surveillance program. It is not intended to diagnose MRSA infection nor to guide or monitor treatment for MRSA  infections. Performed at [facility], [place] [address] [place], [place] [address]      Labs: BNP (last 3 results) No results for input(s): BNP in the last 8760 hours. Basic Metabolic Panel: Recent Labs  Lab 02/20/19 1325 02/21/19 0255 02/22/19 0954  NA 136 137 135  K 3.5 3.4* 3.4*  CL 101 107 104  CO2 23 22 23   GLUCOSE 166* 103* 116*  BUN 17 14 8   CREATININE 0.95 0.84 0.66  CALCIUM 8.6* 7.9* 8.1*   Liver Function Tests: Recent Labs  Lab 02/20/19 1325 02/22/19 0954  AST 27 24  ALT 22 18  ALKPHOS 74 67  BILITOT 0.9 0.5  PROT 6.8 5.9*  ALBUMIN 3.7 3.1*   No results for input(s): LIPASE, AMYLASE in the last 168 hours. No results for input(s): AMMONIA in the last 168 hours. CBC: Recent Labs  Lab 02/20/19 1325 02/21/19 0255  WBC 9.0 8.2  NEUTROABS 7.0  --   HGB 15.4* 13.0  HCT 44.1 38.7  MCV 92.3 92.8  PLT 309 282   Cardiac Enzymes: Recent Labs  Lab 02/20/19 1325  TROPONINI <0.03   BNP: Invalid input(s): POCBNP CBG: No results for input(s): GLUCAP in the last 168 hours. D-Dimer No results for input(s): DDIMER in the last 72 hours. Hgb A1c No results for input(s): HGBA1C in the last 72 hours. Lipid Profile No results for input(s): CHOL, HDL, LDLCALC, TRIG, CHOLHDL, LDLDIRECT in the last 72 hours. Thyroid function studies Recent Labs    02/20/19 1447  TSH 0.754   Anemia work up No results for input(s): VITAMINB12, FOLATE, FERRITIN, TIBC, IRON, RETICCTPCT in the last 72 hours. Urinalysis     Component Value Date/Time   COLORURINE YELLOW 02/20/2019 1447   APPEARANCEUR TURBID (A) 02/20/2019 1447   LABSPEC 1.013 02/20/2019 1447   PHURINE 7.0 02/20/2019 1447   GLUCOSEU NEGATIVE 02/20/2019 1447   GLUCOSEU NEGATIVE 06/21/2018 1202   HGBUR NEGATIVE 02/20/2019 1447   BILIRUBINUR NEGATIVE 02/20/2019 1447   KETONESUR 5 (A) 02/20/2019 1447   PROTEINUR 100 (A) 02/20/2019 1447   UROBILINOGEN 0.2 06/21/2018 1202   NITRITE NEGATIVE 02/20/2019 1447   LEUKOCYTESUR MODERATE (A) 02/20/2019 1447   Sepsis Labs Invalid input(s): PROCALCITONIN,  WBC,  LACTICIDVEN Microbiology Recent Results (from the past 240 hour(s))  Blood culture (routine x 2)     Status: None (Preliminary result)   Collection Time: 02/20/19  2:45 PM  Result Value Ref Range Status   Specimen Description   Final    BLOOD LEFT ANTECUBITAL Performed at [facility] [address] [place], [place] [address]    Special Requests   Final    BOTTLES DRAWN AEROBIC AND ANAEROBIC Blood Culture adequate volume Performed at [facility] [address] [place], [place] [address]    Culture   Final    NO GROWTH < 24 HOURS Performed at [facility] [address], [place], [place] [address]    Report Status PENDING  Incomplete  Blood culture (routine x 2)     Status: None (Preliminary result)   Collection Time: 02/20/19  2:47 PM  Result Value Ref Range Status   Specimen Description   Final    BLOOD LEFT FOREARM Performed at [facility] [address], [place], [place] [address]    Special Requests   Final    BOTTLES DRAWN AEROBIC AND ANAEROBIC Blood Culture adequate volume Performed at [facility]  [address] [place], [place] [address]    Culture   Final    NO GROWTH < 24 HOURS Performed at [facility], [place] [address] [place], [place] [address]    Report Status PENDING  Incomplete  SARS Coronavirus 2 (CEPHEID -  Performed in [facility] hospital lab), Hosp Order     Status: None   Collection Time: 02/20/19  2:47 PM  Result Value Ref Range Status   SARS Coronavirus 2 NEGATIVE NEGATIVE Final    Comment: (NOTE) If result is NEGATIVE SARS-CoV-2 target nucleic acids are NOT DETECTED. The SARS-CoV-2 RNA is generally detectable in upper and lower  respiratory specimens during the acute phase of infection. The lowest  concentration of SARS-CoV-2 viral copies this assay can detect is 250  copies / mL. A negative result does not preclude SARS-CoV-2 infection  and should not be used as the sole basis for treatment or other  patient management decisions.  A negative result may occur with  improper specimen collection / handling, submission of specimen other  than nasopharyngeal swab, presence of viral mutation(s) within the  areas targeted by this assay, and inadequate number of viral copies  (<250 copies / mL). A negative result must be combined with clinical  observations, patient history, and epidemiological information. If result is POSITIVE SARS-CoV-2 target nucleic acids are DETECTED. The SARS-CoV-2 RNA is generally detectable in upper and lower  respiratory specimens dur ing the acute phase of infection.  Positive  results are indicative of active infection with SARS-CoV-2.  Clinical  correlation with patient history and other diagnostic information is  necessary to determine patient infection status.  Positive results do  not rule out bacterial infection or co-infection with other viruses. If result is PRESUMPTIVE POSTIVE SARS-CoV-2 nucleic acids MAY BE PRESENT.   A presumptive positive result was obtained on the submitted specimen  and confirmed on repeat testing.  While 2019 novel coronavirus  (SARS-CoV-2) nucleic acids may be present in the submitted sample  additional confirmatory testing may be necessary for epidemiological  and / or clinical management purposes  to differentiate between   SARS-CoV-2 and other Sarbecovirus currently known to infect humans.  If clinically indicated additional testing with an alternate test  methodology [phone]) is advised. The SARS-CoV-2 RNA is generally  detectable in upper and lower respiratory sp ecimens during the acute  phase of infection. The expected result is Negative. Fact Sheet for Patients:  StrictlyIdeas.no Fact Sheet for Healthcare Providers: BankingDealers.co.za This test is not yet approved or cleared by the [place] FDA and has been authorized for detection and/or diagnosis of SARS-CoV-2 by FDA under an Emergency Use Authorization (EUA).  This EUA will remain in effect (meaning this test can be used) for the duration of the COVID-19 declaration under Section 564(b)(1) of the Act, 21 U.S.C. section 360bbb-3(b)(1), unless the authorization is terminated or revoked sooner. Performed at [facility], [place] [address] [place], [place] [address]   Urine culture     Status: Abnormal   Collection Time: 02/20/19  2:48 PM  Result Value Ref Range Status   Specimen Description   Final    URINE, RANDOM Performed at [facility] [address] [place], [place] [address]    Special Requests   Final    NONE Performed at [facility], [place] [address] [place], [place] [address]    Culture >=100,000 COLONIES/mL PROTEUS MIRABILIS (A)  Final   Report Status 02/22/2019 FINAL  Final  Organism ID, Bacteria PROTEUS MIRABILIS (A)  Final      Susceptibility   Proteus mirabilis - MIC*    AMPICILLIN <=2 SENSITIVE Sensitive     CEFAZOLIN <=4 SENSITIVE Sensitive     CEFTRIAXONE <=1 SENSITIVE Sensitive     CIPROFLOXACIN 2 INTERMEDIATE Intermediate     GENTAMICIN <=1 SENSITIVE Sensitive     IMIPENEM 1 SENSITIVE Sensitive     NITROFURANTOIN 128 RESISTANT Resistant     TRIMETH/SULFA <=20 SENSITIVE Sensitive     AMPICILLIN/SULBACTAM  <=2 SENSITIVE Sensitive     PIP/TAZO <=4 SENSITIVE Sensitive     * >=100,000 COLONIES/mL PROTEUS MIRABILIS  MRSA PCR Screening     Status: None   Collection Time: 02/20/19  9:20 PM  Result Value Ref Range Status   MRSA by PCR NEGATIVE NEGATIVE Final    Comment:        The GeneXpert MRSA Assay (FDA approved for NASAL specimens only), is one component of a comprehensive MRSA colonization surveillance program. It is not intended to diagnose MRSA infection nor to guide or monitor treatment for MRSA infections. Performed at [facility], [place] [address], [place], [place] [address]      Time coordinating discharge: 40 minutes  SIGNED:   Darliss Cheney, MD  [facility] 02/22/2019, 2:03 PM Pager [phone]  If 7PM-7AM, please contact night-coverage [email or web address] Password [email or web address]

## 2019-02-22 NOTE — Discharge Instructions (Signed)
Infeccin urinaria en los adultos  Urinary Tract Infection, Adult  Una infeccin urinaria (IU) puede ocurrir en cualquier lugar de las vas urinarias. Las vas urinarias incluyen lo siguiente:   Los riones.   Los urteres.   La vejiga.   La uretra.  Estos rganos fabrican, almacenan y eliminan el pis (orina) del cuerpo.  Cules son las causas?  La causa es la presencia de grmenes (bacterias) en la zona genital. Estos grmenes proliferan y causan hinchazn (inflamacin) de las vas urinarias.  Qu incrementa el riesgo?  Es ms probable que contraiga esta afeccin si:   Tiene colocado un tubo delgado y pequeo (catter) para drenar el pis.   Nopuede controlar la evacuacin de pis ni de materia fecal (incontinencia).   Es mujer y, adems:  ? Usa estos mtodos para evitar el embarazo:  ? Un medicamento que mata los espermatozoides (espermicida).  ? Un dispositivo que impide el paso de los espermatozoides (diafragma).  ? Tieneniveles bajos de una hormona femenina (estrgeno).  ? Est embarazada.   Tiene genes que aumentan su riesgo.   Es sexualmente activa.   Toma antibiticos.   Tiene dificultad para orinar debido a:  ? Su prstata es ms grande de lo normal, si usted es hombre.  ? Obstruccin en la parte del cuerpo que drena el pis de la vejiga (uretra).  ? Clculo renal.  ? Untrastorno nervioso que afecta la vejiga (vejiga neurgena).  ? No bebe una cantidad suficiente de lquido.  ? No hace pis con la frecuencia suficiente.   Tiene otras afecciones, como:  ? Diabetes.  ? Un sistema que combate las enfermedades (sistema inmunitario) debilitado.  ? Anemia drepanoctica.  ? Gota.  ? Lesin en la columna vertebral.  Cules son los signos o los sntomas?  Los sntomas de esta afeccin incluyen:   Necesidad inmediata (urgente) de hacer pis.   Hacer pis con frecuencia.   Hacer poca cantidad de pis con mucha frecuencia.   Dolor o ardor al hacer pis.   Sangre en el pis.   Pis que huele mal o  anormal.   Dificultad para hacer pis.   Pis turbio.   Lquido que sale de la vagina, si es mujer.   Dolor en la barriga o en la parte baja de la espalda.  Otros sntomas pueden incluir los siguientes:   Ganas de devolver (vmitos).   No sentir deseos de comer.   Sentirse confundido (confuso).   Sentirse cansado y malhumorado (irritable).   Fiebre.   Materia fecal lquida (diarrea).  Cmo se trata?  El tratamiento de esta afeccin puede incluir:   Antibiticos.   Otros medicamentos.   Beber una cantidad suficiente de agua.  Siga estas indicaciones en su casa:    Medicamentos   Tome los medicamentos de venta libre y los recetados solamente como se lo haya indicado el mdico.   Si le recetaron un antibitico, tmelo como se lo haya indicado el mdico. No deje de tomarlo aunque comience a sentirse mejor.  Indicaciones generales   Asegrese de hacer lo siguiente:  ? Haga pis hasta que la vejiga quede vaca.  ? Nocontenga el pis durante mucho tiempo.  ? Vace la vejiga despus de tener relaciones sexuales.  ? Lmpiese de adelante hacia atrs despus de defecar, si es mujer. Use cada trozo de papel higinico solo una vez cuando se limpie.   Beba suficiente lquido como para mantener la orina de color amarillo plido.   Concurra a todas las   visitas de control como se lo haya indicado el mdico. Esto es importante.  Comunquese con un mdico si:   No mejora despus de 1 o 2das de tratamiento.   Los sntomas desaparecen y luego reaparecen.  Solicite ayuda inmediatamente si:   Tiene un dolor muy intenso en la espalda.   Tiene dolor muy intenso en la parte baja de la barriga.   Tiene fiebre.   Tiene malestar estomacal (nuseas).   Tiene vmitos.  Resumen   Una infeccin urinaria (IU) puede ocurrir en cualquier lugar de las vas urinarias.   Esta afeccin es causada por la presencia de grmenes en la zona genital.   Existen muchos factores de riesgo de sufrir una IU. Estos incluyen tener colocado  un tubo delgado y pequeo para drenar el pis y no poder controlar cundo hace pis y materia fecal.   El tratamiento incluye antibiticos contra los grmenes.   Beba suficiente lquido como para mantener la orina de color amarillo plido.  Esta informacin no tiene como fin reemplazar el consejo del mdico. Asegrese de hacerle al mdico cualquier pregunta que tenga.  Document Released: 03/02/2010 Document Revised: 05/16/2018 Document Reviewed: 05/16/2018  Elsevier Interactive Patient Education  2019 Elsevier Inc.

## 2019-02-25 ENCOUNTER — Inpatient Hospital Stay: Payer: Medicare Other | Admitting: Adult Health

## 2019-02-25 DIAGNOSIS — I69328 Other speech and language deficits following cerebral infarction: Secondary | ICD-10-CM | POA: Diagnosis not present

## 2019-02-25 DIAGNOSIS — R41841 Cognitive communication deficit: Secondary | ICD-10-CM | POA: Diagnosis not present

## 2019-02-25 DIAGNOSIS — I69252 Hemiplegia and hemiparesis following other nontraumatic intracranial hemorrhage affecting left dominant side: Secondary | ICD-10-CM | POA: Diagnosis not present

## 2019-02-25 DIAGNOSIS — M6281 Muscle weakness (generalized): Secondary | ICD-10-CM | POA: Diagnosis not present

## 2019-02-25 DIAGNOSIS — R260 Ataxic gait: Secondary | ICD-10-CM | POA: Diagnosis not present

## 2019-02-25 DIAGNOSIS — R2681 Unsteadiness on feet: Secondary | ICD-10-CM | POA: Diagnosis not present

## 2019-02-25 DIAGNOSIS — R278 Other lack of coordination: Secondary | ICD-10-CM | POA: Diagnosis not present

## 2019-02-25 DIAGNOSIS — R2689 Other abnormalities of gait and mobility: Secondary | ICD-10-CM | POA: Diagnosis not present

## 2019-02-25 LAB — CULTURE, BLOOD (ROUTINE X 2)
Culture: NO GROWTH
Culture: NO GROWTH
Special Requests: ADEQUATE
Special Requests: ADEQUATE

## 2019-02-25 NOTE — Telephone Encounter (Signed)
pts daughter in law called in and stated they needed to r/s appt that is scheduled for today at 11:15 due to patient just getting home from hospital.

## 2019-02-26 ENCOUNTER — Telehealth: Payer: Self-pay | Admitting: Internal Medicine

## 2019-02-26 DIAGNOSIS — R2689 Other abnormalities of gait and mobility: Secondary | ICD-10-CM | POA: Diagnosis not present

## 2019-02-26 DIAGNOSIS — R260 Ataxic gait: Secondary | ICD-10-CM | POA: Diagnosis not present

## 2019-02-26 DIAGNOSIS — R41841 Cognitive communication deficit: Secondary | ICD-10-CM | POA: Diagnosis not present

## 2019-02-26 DIAGNOSIS — M6281 Muscle weakness (generalized): Secondary | ICD-10-CM | POA: Diagnosis not present

## 2019-02-26 DIAGNOSIS — R278 Other lack of coordination: Secondary | ICD-10-CM | POA: Diagnosis not present

## 2019-02-26 DIAGNOSIS — R2681 Unsteadiness on feet: Secondary | ICD-10-CM | POA: Diagnosis not present

## 2019-02-26 NOTE — Telephone Encounter (Signed)
Discussed with Daughter in [employer]. There had been miscommunication regarding if battery had 1 month left, or one month until ERI.  Pt scheduled for monthly battery checks. No further questions at this time.    Legrand Como [address]" [place], PA-C 02/26/2019 11:09 AM

## 2019-02-26 NOTE — Telephone Encounter (Signed)
Wells Guiles , Daughter in [place] of the patient called in regards to the battery change for the patient's pacemaker. She reached out to the office last week, but has not heard any follow up.

## 2019-02-27 ENCOUNTER — Other Ambulatory Visit: Payer: Self-pay | Admitting: *Deleted

## 2019-02-27 DIAGNOSIS — R260 Ataxic gait: Secondary | ICD-10-CM | POA: Diagnosis not present

## 2019-02-27 DIAGNOSIS — R2681 Unsteadiness on feet: Secondary | ICD-10-CM | POA: Diagnosis not present

## 2019-02-27 DIAGNOSIS — R41841 Cognitive communication deficit: Secondary | ICD-10-CM | POA: Diagnosis not present

## 2019-02-27 DIAGNOSIS — R278 Other lack of coordination: Secondary | ICD-10-CM | POA: Diagnosis not present

## 2019-02-27 DIAGNOSIS — M6281 Muscle weakness (generalized): Secondary | ICD-10-CM | POA: Diagnosis not present

## 2019-02-27 DIAGNOSIS — R2689 Other abnormalities of gait and mobility: Secondary | ICD-10-CM | POA: Diagnosis not present

## 2019-02-27 NOTE — Patient Outreach (Signed)
HIGH RISK HTA PATIENT REFERRED FOR DISCUSSION REGARDING LEVEL OF CARE AND GOALS OF CARE.  Left message on daughter, [name] phone to return my call.  If I do not hear back from her I will call her tomorrow.  [name]. [name] Neither, MSN, [facility] Gerontological Nurse Practitioner [facility] Care Management [phone]

## 2019-02-27 NOTE — Progress Notes (Addendum)
Remote pacemaker transmission.   

## 2019-02-28 DIAGNOSIS — R278 Other lack of coordination: Secondary | ICD-10-CM | POA: Diagnosis not present

## 2019-02-28 DIAGNOSIS — M6281 Muscle weakness (generalized): Secondary | ICD-10-CM | POA: Diagnosis not present

## 2019-02-28 DIAGNOSIS — R41841 Cognitive communication deficit: Secondary | ICD-10-CM | POA: Diagnosis not present

## 2019-02-28 DIAGNOSIS — R260 Ataxic gait: Secondary | ICD-10-CM | POA: Diagnosis not present

## 2019-02-28 DIAGNOSIS — R2681 Unsteadiness on feet: Secondary | ICD-10-CM | POA: Diagnosis not present

## 2019-02-28 DIAGNOSIS — R2689 Other abnormalities of gait and mobility: Secondary | ICD-10-CM | POA: Diagnosis not present

## 2019-03-01 DIAGNOSIS — R2681 Unsteadiness on feet: Secondary | ICD-10-CM | POA: Diagnosis not present

## 2019-03-01 DIAGNOSIS — R278 Other lack of coordination: Secondary | ICD-10-CM | POA: Diagnosis not present

## 2019-03-01 DIAGNOSIS — R41841 Cognitive communication deficit: Secondary | ICD-10-CM | POA: Diagnosis not present

## 2019-03-01 DIAGNOSIS — R2689 Other abnormalities of gait and mobility: Secondary | ICD-10-CM | POA: Diagnosis not present

## 2019-03-01 DIAGNOSIS — R260 Ataxic gait: Secondary | ICD-10-CM | POA: Diagnosis not present

## 2019-03-01 DIAGNOSIS — M6281 Muscle weakness (generalized): Secondary | ICD-10-CM | POA: Diagnosis not present

## 2019-03-04 DIAGNOSIS — R278 Other lack of coordination: Secondary | ICD-10-CM | POA: Diagnosis not present

## 2019-03-04 DIAGNOSIS — R41841 Cognitive communication deficit: Secondary | ICD-10-CM | POA: Diagnosis not present

## 2019-03-04 DIAGNOSIS — M6281 Muscle weakness (generalized): Secondary | ICD-10-CM | POA: Diagnosis not present

## 2019-03-04 DIAGNOSIS — R2681 Unsteadiness on feet: Secondary | ICD-10-CM | POA: Diagnosis not present

## 2019-03-04 DIAGNOSIS — R2689 Other abnormalities of gait and mobility: Secondary | ICD-10-CM | POA: Diagnosis not present

## 2019-03-04 DIAGNOSIS — R260 Ataxic gait: Secondary | ICD-10-CM | POA: Diagnosis not present

## 2019-03-05 ENCOUNTER — Telehealth: Payer: Self-pay

## 2019-03-05 DIAGNOSIS — R2681 Unsteadiness on feet: Secondary | ICD-10-CM | POA: Diagnosis not present

## 2019-03-05 DIAGNOSIS — R41841 Cognitive communication deficit: Secondary | ICD-10-CM | POA: Diagnosis not present

## 2019-03-05 DIAGNOSIS — R278 Other lack of coordination: Secondary | ICD-10-CM | POA: Diagnosis not present

## 2019-03-05 DIAGNOSIS — R2689 Other abnormalities of gait and mobility: Secondary | ICD-10-CM | POA: Diagnosis not present

## 2019-03-05 DIAGNOSIS — M6281 Muscle weakness (generalized): Secondary | ICD-10-CM | POA: Diagnosis not present

## 2019-03-05 DIAGNOSIS — R260 Ataxic gait: Secondary | ICD-10-CM | POA: Diagnosis not present

## 2019-03-05 MED ORDER — AMLODIPINE BESYLATE 10 MG PO TABS: ORAL_TABLET | ORAL | 1 refills | Status: DC

## 2019-03-05 MED ORDER — LEVOTHYROXINE SODIUM 125 MCG PO TABS: ORAL_TABLET | ORAL | 1 refills | Status: DC

## 2019-03-05 MED ORDER — CLOPIDOGREL BISULFATE 75 MG PO TABS: 75.0000 mg | ORAL_TABLET | Freq: Every day | ORAL | 1 refills | Status: DC

## 2019-03-05 MED ORDER — DONEPEZIL HCL 10 MG PO TABS: ORAL_TABLET | ORAL | 1 refills | Status: DC

## 2019-03-05 NOTE — Telephone Encounter (Signed)
Refills were sent earlier this morning.

## 2019-03-05 NOTE — Telephone Encounter (Signed)
Patient daughter in [place] called to say that patient is needing a refill on these medication listed they were started in the hospital recently. Please call patient and family when sent to pharmacy on file.Marland Kitchen

## 2019-03-06 DIAGNOSIS — R2689 Other abnormalities of gait and mobility: Secondary | ICD-10-CM | POA: Diagnosis not present

## 2019-03-06 DIAGNOSIS — M6281 Muscle weakness (generalized): Secondary | ICD-10-CM | POA: Diagnosis not present

## 2019-03-06 DIAGNOSIS — R260 Ataxic gait: Secondary | ICD-10-CM | POA: Diagnosis not present

## 2019-03-06 DIAGNOSIS — R2681 Unsteadiness on feet: Secondary | ICD-10-CM | POA: Diagnosis not present

## 2019-03-06 DIAGNOSIS — R41841 Cognitive communication deficit: Secondary | ICD-10-CM | POA: Diagnosis not present

## 2019-03-06 DIAGNOSIS — R278 Other lack of coordination: Secondary | ICD-10-CM | POA: Diagnosis not present

## 2019-03-07 DIAGNOSIS — R41841 Cognitive communication deficit: Secondary | ICD-10-CM | POA: Diagnosis not present

## 2019-03-07 DIAGNOSIS — M6281 Muscle weakness (generalized): Secondary | ICD-10-CM | POA: Diagnosis not present

## 2019-03-07 DIAGNOSIS — R2681 Unsteadiness on feet: Secondary | ICD-10-CM | POA: Diagnosis not present

## 2019-03-07 DIAGNOSIS — R260 Ataxic gait: Secondary | ICD-10-CM | POA: Diagnosis not present

## 2019-03-07 DIAGNOSIS — R2689 Other abnormalities of gait and mobility: Secondary | ICD-10-CM | POA: Diagnosis not present

## 2019-03-07 DIAGNOSIS — R278 Other lack of coordination: Secondary | ICD-10-CM | POA: Diagnosis not present

## 2019-03-07 MED ORDER — APIXABAN 5 MG PO TABS: 5.0000 mg | ORAL_TABLET | Freq: Two times a day (BID) | ORAL | 2 refills | Status: DC

## 2019-03-07 MED ORDER — VALSARTAN 80 MG PO TABS: 80.0000 mg | ORAL_TABLET | Freq: Every day | ORAL | 1 refills | Status: DC

## 2019-03-07 MED ORDER — PRAVASTATIN SODIUM 10 MG PO TABS: 10.0000 mg | ORAL_TABLET | Freq: Every day | ORAL | 1 refills | Status: DC

## 2019-03-07 MED ORDER — PANTOPRAZOLE SODIUM 40 MG PO TBEC: 40.0000 mg | DELAYED_RELEASE_TABLET | Freq: Every day | ORAL | 1 refills | Status: DC

## 2019-03-07 NOTE — Telephone Encounter (Signed)
Pt's daughter in law Wells Guiles called regarding pt's medication refills. Agent gave her a list of most recent refills sent in and there were discrepancies with what pt should and shouldn't be on. Requesting a call back from Dr. Gwynn Burly assistant to confirm pt's medications and also refills that are needed. She stated pt will be out of some medications tomorrow. Please advise. CB#[phone]

## 2019-03-07 NOTE — Telephone Encounter (Signed)
Patient says she also needs 90 day supply of pantoprazole sodium (PROTONIX) 40 mg/20 mL PACK sent to [facility] on [address].

## 2019-03-07 NOTE — Telephone Encounter (Signed)
Wells Guiles needed additional meds called in. I have sent in refills to the pharmacy on file.

## 2019-03-07 NOTE — Addendum Note (Signed)
Addended by: Juliet Rude on: 03/07/2019 01:01 PM   Modules accepted: Orders

## 2019-03-07 NOTE — Telephone Encounter (Signed)
Done again hardcopy to [name]

## 2019-03-07 NOTE — Telephone Encounter (Signed)
Daugter in [employer] educational needs teacher, says the [employer] never received order for reclining lift chair so it was purchased out of pocket and now she requesting documentation that the chair is medically necessary for the patient insurance. Please advise.

## 2019-03-07 NOTE — Addendum Note (Signed)
Addended by: Juliet Rude on: 03/07/2019 10:38 AM   Modules accepted: Orders

## 2019-03-08 ENCOUNTER — Telehealth: Payer: Self-pay | Admitting: Internal Medicine

## 2019-03-08 DIAGNOSIS — M6281 Muscle weakness (generalized): Secondary | ICD-10-CM | POA: Diagnosis not present

## 2019-03-08 DIAGNOSIS — R278 Other lack of coordination: Secondary | ICD-10-CM | POA: Diagnosis not present

## 2019-03-08 DIAGNOSIS — R2681 Unsteadiness on feet: Secondary | ICD-10-CM | POA: Diagnosis not present

## 2019-03-08 DIAGNOSIS — R260 Ataxic gait: Secondary | ICD-10-CM | POA: Diagnosis not present

## 2019-03-08 DIAGNOSIS — R41841 Cognitive communication deficit: Secondary | ICD-10-CM | POA: Diagnosis not present

## 2019-03-08 DIAGNOSIS — R2689 Other abnormalities of gait and mobility: Secondary | ICD-10-CM | POA: Diagnosis not present

## 2019-03-08 NOTE — Telephone Encounter (Signed)
Called [name] LVM asking if she would like letter to be mailed or ready for pick-up?

## 2019-03-08 NOTE — Telephone Encounter (Signed)
DONE

## 2019-03-08 NOTE — Telephone Encounter (Signed)
Copied from [place] [id number]. Topic: Quick Communication - See Telephone Encounter >> Mar 08, 2019  8:30 AM Robina Ade, Helene Kelp D wrote: CRM for notification. See Telephone encounter for: 03/08/19. Haili is daughter in law of patient and said that the letter that Dr. Jenny Reichmann talked about, they would like the letter to be mailed to pt office.

## 2019-03-09 DIAGNOSIS — R2681 Unsteadiness on feet: Secondary | ICD-10-CM | POA: Diagnosis not present

## 2019-03-09 DIAGNOSIS — R278 Other lack of coordination: Secondary | ICD-10-CM | POA: Diagnosis not present

## 2019-03-09 DIAGNOSIS — R2689 Other abnormalities of gait and mobility: Secondary | ICD-10-CM | POA: Diagnosis not present

## 2019-03-09 DIAGNOSIS — R260 Ataxic gait: Secondary | ICD-10-CM | POA: Diagnosis not present

## 2019-03-09 DIAGNOSIS — R41841 Cognitive communication deficit: Secondary | ICD-10-CM | POA: Diagnosis not present

## 2019-03-09 DIAGNOSIS — M6281 Muscle weakness (generalized): Secondary | ICD-10-CM | POA: Diagnosis not present

## 2019-03-11 DIAGNOSIS — R41841 Cognitive communication deficit: Secondary | ICD-10-CM | POA: Diagnosis not present

## 2019-03-11 DIAGNOSIS — R278 Other lack of coordination: Secondary | ICD-10-CM | POA: Diagnosis not present

## 2019-03-11 DIAGNOSIS — R260 Ataxic gait: Secondary | ICD-10-CM | POA: Diagnosis not present

## 2019-03-11 DIAGNOSIS — R2681 Unsteadiness on feet: Secondary | ICD-10-CM | POA: Diagnosis not present

## 2019-03-11 DIAGNOSIS — M6281 Muscle weakness (generalized): Secondary | ICD-10-CM | POA: Diagnosis not present

## 2019-03-11 DIAGNOSIS — R2689 Other abnormalities of gait and mobility: Secondary | ICD-10-CM | POA: Diagnosis not present

## 2019-03-12 DIAGNOSIS — M6281 Muscle weakness (generalized): Secondary | ICD-10-CM | POA: Diagnosis not present

## 2019-03-12 DIAGNOSIS — R41841 Cognitive communication deficit: Secondary | ICD-10-CM | POA: Diagnosis not present

## 2019-03-12 DIAGNOSIS — R260 Ataxic gait: Secondary | ICD-10-CM | POA: Diagnosis not present

## 2019-03-12 DIAGNOSIS — R2681 Unsteadiness on feet: Secondary | ICD-10-CM | POA: Diagnosis not present

## 2019-03-12 DIAGNOSIS — R278 Other lack of coordination: Secondary | ICD-10-CM | POA: Diagnosis not present

## 2019-03-12 DIAGNOSIS — R2689 Other abnormalities of gait and mobility: Secondary | ICD-10-CM | POA: Diagnosis not present

## 2019-03-13 DIAGNOSIS — R278 Other lack of coordination: Secondary | ICD-10-CM | POA: Diagnosis not present

## 2019-03-13 DIAGNOSIS — M6281 Muscle weakness (generalized): Secondary | ICD-10-CM | POA: Diagnosis not present

## 2019-03-13 DIAGNOSIS — R260 Ataxic gait: Secondary | ICD-10-CM | POA: Diagnosis not present

## 2019-03-13 DIAGNOSIS — R2689 Other abnormalities of gait and mobility: Secondary | ICD-10-CM | POA: Diagnosis not present

## 2019-03-13 DIAGNOSIS — R2681 Unsteadiness on feet: Secondary | ICD-10-CM | POA: Diagnosis not present

## 2019-03-13 DIAGNOSIS — R41841 Cognitive communication deficit: Secondary | ICD-10-CM | POA: Diagnosis not present

## 2019-03-14 ENCOUNTER — Other Ambulatory Visit: Payer: Self-pay

## 2019-03-14 DIAGNOSIS — R41841 Cognitive communication deficit: Secondary | ICD-10-CM | POA: Diagnosis not present

## 2019-03-14 DIAGNOSIS — R2689 Other abnormalities of gait and mobility: Secondary | ICD-10-CM | POA: Diagnosis not present

## 2019-03-14 DIAGNOSIS — R260 Ataxic gait: Secondary | ICD-10-CM | POA: Diagnosis not present

## 2019-03-14 DIAGNOSIS — M6281 Muscle weakness (generalized): Secondary | ICD-10-CM | POA: Diagnosis not present

## 2019-03-14 DIAGNOSIS — R2681 Unsteadiness on feet: Secondary | ICD-10-CM | POA: Diagnosis not present

## 2019-03-14 DIAGNOSIS — R278 Other lack of coordination: Secondary | ICD-10-CM | POA: Diagnosis not present

## 2019-03-14 NOTE — Patient Outreach (Signed)
First attempt to obtain mRs. Patient resides at [facility]. No answer. Left message for return call.

## 2019-03-15 DIAGNOSIS — R2681 Unsteadiness on feet: Secondary | ICD-10-CM | POA: Diagnosis not present

## 2019-03-15 DIAGNOSIS — R41841 Cognitive communication deficit: Secondary | ICD-10-CM | POA: Diagnosis not present

## 2019-03-15 DIAGNOSIS — R2689 Other abnormalities of gait and mobility: Secondary | ICD-10-CM | POA: Diagnosis not present

## 2019-03-15 DIAGNOSIS — R260 Ataxic gait: Secondary | ICD-10-CM | POA: Diagnosis not present

## 2019-03-15 DIAGNOSIS — M6281 Muscle weakness (generalized): Secondary | ICD-10-CM | POA: Diagnosis not present

## 2019-03-15 DIAGNOSIS — R278 Other lack of coordination: Secondary | ICD-10-CM | POA: Diagnosis not present

## 2019-03-18 DIAGNOSIS — R2681 Unsteadiness on feet: Secondary | ICD-10-CM | POA: Diagnosis not present

## 2019-03-18 DIAGNOSIS — R278 Other lack of coordination: Secondary | ICD-10-CM | POA: Diagnosis not present

## 2019-03-18 DIAGNOSIS — R260 Ataxic gait: Secondary | ICD-10-CM | POA: Diagnosis not present

## 2019-03-18 DIAGNOSIS — M6281 Muscle weakness (generalized): Secondary | ICD-10-CM | POA: Diagnosis not present

## 2019-03-18 DIAGNOSIS — R2689 Other abnormalities of gait and mobility: Secondary | ICD-10-CM | POA: Diagnosis not present

## 2019-03-18 DIAGNOSIS — R41841 Cognitive communication deficit: Secondary | ICD-10-CM | POA: Diagnosis not present

## 2019-03-19 DIAGNOSIS — R2681 Unsteadiness on feet: Secondary | ICD-10-CM | POA: Diagnosis not present

## 2019-03-19 DIAGNOSIS — R260 Ataxic gait: Secondary | ICD-10-CM | POA: Diagnosis not present

## 2019-03-19 DIAGNOSIS — R41841 Cognitive communication deficit: Secondary | ICD-10-CM | POA: Diagnosis not present

## 2019-03-19 DIAGNOSIS — R2689 Other abnormalities of gait and mobility: Secondary | ICD-10-CM | POA: Diagnosis not present

## 2019-03-19 DIAGNOSIS — R278 Other lack of coordination: Secondary | ICD-10-CM | POA: Diagnosis not present

## 2019-03-19 DIAGNOSIS — M6281 Muscle weakness (generalized): Secondary | ICD-10-CM | POA: Diagnosis not present

## 2019-03-20 ENCOUNTER — Other Ambulatory Visit: Payer: Self-pay

## 2019-03-20 ENCOUNTER — Telehealth: Payer: Self-pay

## 2019-03-20 DIAGNOSIS — R2681 Unsteadiness on feet: Secondary | ICD-10-CM | POA: Diagnosis not present

## 2019-03-20 DIAGNOSIS — R260 Ataxic gait: Secondary | ICD-10-CM | POA: Diagnosis not present

## 2019-03-20 DIAGNOSIS — R41841 Cognitive communication deficit: Secondary | ICD-10-CM | POA: Diagnosis not present

## 2019-03-20 DIAGNOSIS — R278 Other lack of coordination: Secondary | ICD-10-CM | POA: Diagnosis not present

## 2019-03-20 DIAGNOSIS — M6281 Muscle weakness (generalized): Secondary | ICD-10-CM | POA: Diagnosis not present

## 2019-03-20 DIAGNOSIS — R2689 Other abnormalities of gait and mobility: Secondary | ICD-10-CM | POA: Diagnosis not present

## 2019-03-20 NOTE — Telephone Encounter (Signed)
I called [facility] and they stated pt lives in the independent division. They advise me to call her daughter in law about visit.

## 2019-03-20 NOTE — Telephone Encounter (Signed)
Left vm for patients daughter in law Wells Guiles that pts appt will be a mychart video visit due to [place] 19. Pt already has a mychart account. Left vm for patient to call back for more instructions.

## 2019-03-20 NOTE — Patient Outreach (Signed)
Second attempt to obtain mRs. No answer. Left message for return call.  

## 2019-03-21 ENCOUNTER — Other Ambulatory Visit: Payer: Self-pay

## 2019-03-21 ENCOUNTER — Telehealth: Payer: Self-pay | Admitting: Emergency Medicine

## 2019-03-21 ENCOUNTER — Telehealth: Payer: Self-pay | Admitting: Internal Medicine

## 2019-03-21 DIAGNOSIS — M6281 Muscle weakness (generalized): Secondary | ICD-10-CM | POA: Diagnosis not present

## 2019-03-21 DIAGNOSIS — R41841 Cognitive communication deficit: Secondary | ICD-10-CM | POA: Diagnosis not present

## 2019-03-21 DIAGNOSIS — R2689 Other abnormalities of gait and mobility: Secondary | ICD-10-CM | POA: Diagnosis not present

## 2019-03-21 DIAGNOSIS — R300 Dysuria: Secondary | ICD-10-CM

## 2019-03-21 DIAGNOSIS — R260 Ataxic gait: Secondary | ICD-10-CM | POA: Diagnosis not present

## 2019-03-21 DIAGNOSIS — R2681 Unsteadiness on feet: Secondary | ICD-10-CM | POA: Diagnosis not present

## 2019-03-21 DIAGNOSIS — R278 Other lack of coordination: Secondary | ICD-10-CM | POA: Diagnosis not present

## 2019-03-21 LAB — CUP PACEART REMOTE DEVICE CHECK
Battery Impedance: 8785 Ohm
Battery Remaining Longevity: 0 mo
Battery Voltage: 2.6 V
Brady Statistic RV Percent Paced: 99 %
Date Time Interrogation Session: 20200625145409
Implantable Lead Implant Date: 20020920
Implantable Lead Implant Date: 20020920
Implantable Lead Location: 753859
Implantable Lead Location: 753860
Implantable Lead Model: 5076
Implantable Lead Model: 5076
Implantable Pulse Generator Implant Date: 20100721
Lead Channel Impedance Value: 533 Ohm
Lead Channel Setting Pacing Amplitude: 2.75 V
Lead Channel Setting Pacing Pulse Width: 0.4 ms
Lead Channel Setting Sensing Sensitivity: 2.8 mV

## 2019-03-21 NOTE — Telephone Encounter (Signed)
Spoke with daughter to inform orders have been placed and specimen cup will be upfront for pick up to return back to lab once specimen is collected.

## 2019-03-21 NOTE — Telephone Encounter (Signed)
Left vm for Wells Guiles that appt will be cancel on Monday. I stated it will be a mychart video visit due to COVID 19. I stated vm was left on 6/24 and to call back to r/s with Janett Billow NP. This is a hospital follow up for pt.

## 2019-03-21 NOTE — Telephone Encounter (Signed)
Pts daughter calling stating that pt is a resident at [facility] and if she leaves the facility will have to be quarantined for 14 days upon return. She is currently having UTI like symptoms of leaking, confusion/ irritability. Had UTI 3 weeks ago and was given antibiotics at the hospital. Please advise if you are okay with please the lab orders for [facility] to pick up specimen cup and return to lab.    CB [phone]

## 2019-03-21 NOTE — Telephone Encounter (Signed)
Pt battery at [facility] as of 03/21/19. Pt requires virtual visit with Dr Lovena Le due to resident in facility. Contact Ilona Sorrel (DPR) to set up. Best #: [phone]

## 2019-03-21 NOTE — Telephone Encounter (Signed)
Yes, that would be good, thanks - UA and culture - dysuria

## 2019-03-22 ENCOUNTER — Ambulatory Visit (INDEPENDENT_AMBULATORY_CARE_PROVIDER_SITE_OTHER): Payer: Medicare Other | Admitting: *Deleted

## 2019-03-22 ENCOUNTER — Other Ambulatory Visit: Payer: Self-pay

## 2019-03-22 ENCOUNTER — Other Ambulatory Visit: Payer: Self-pay | Admitting: Internal Medicine

## 2019-03-22 ENCOUNTER — Other Ambulatory Visit (INDEPENDENT_AMBULATORY_CARE_PROVIDER_SITE_OTHER): Payer: Medicare Other

## 2019-03-22 DIAGNOSIS — R2681 Unsteadiness on feet: Secondary | ICD-10-CM | POA: Diagnosis not present

## 2019-03-22 DIAGNOSIS — R41841 Cognitive communication deficit: Secondary | ICD-10-CM | POA: Diagnosis not present

## 2019-03-22 DIAGNOSIS — R2689 Other abnormalities of gait and mobility: Secondary | ICD-10-CM | POA: Diagnosis not present

## 2019-03-22 DIAGNOSIS — Z950 Presence of cardiac pacemaker: Secondary | ICD-10-CM

## 2019-03-22 DIAGNOSIS — R300 Dysuria: Secondary | ICD-10-CM | POA: Diagnosis not present

## 2019-03-22 DIAGNOSIS — M6281 Muscle weakness (generalized): Secondary | ICD-10-CM | POA: Diagnosis not present

## 2019-03-22 DIAGNOSIS — R278 Other lack of coordination: Secondary | ICD-10-CM | POA: Diagnosis not present

## 2019-03-22 DIAGNOSIS — R260 Ataxic gait: Secondary | ICD-10-CM | POA: Diagnosis not present

## 2019-03-22 LAB — URINALYSIS, ROUTINE W REFLEX MICROSCOPIC
Bilirubin Urine: NEGATIVE
Ketones, ur: NEGATIVE
Nitrite: NEGATIVE
Specific Gravity, Urine: 1.025 (ref 1.000–1.030)
Total Protein, Urine: NEGATIVE
Urine Glucose: NEGATIVE
Urobilinogen, UA: 0.2 (ref 0.0–1.0)
pH: 6 (ref 5.0–8.0)

## 2019-03-22 MED ORDER — CIPROFLOXACIN HCL 500 MG PO TABS: 500.0000 mg | ORAL_TABLET | Freq: Two times a day (BID) | ORAL | 0 refills | Status: AC

## 2019-03-22 NOTE — Telephone Encounter (Signed)
Ok. gT

## 2019-03-24 ENCOUNTER — Other Ambulatory Visit: Payer: Self-pay | Admitting: Internal Medicine

## 2019-03-24 LAB — URINE CULTURE
MICRO NUMBER:: 611942
SPECIMEN QUALITY:: ADEQUATE

## 2019-03-24 MED ORDER — NITROFURANTOIN MONOHYD MACRO 100 MG PO CAPS: 100.0000 mg | ORAL_CAPSULE | Freq: Two times a day (BID) | ORAL | 0 refills | Status: DC

## 2019-03-25 ENCOUNTER — Inpatient Hospital Stay: Payer: Medicare Other | Admitting: Adult Health

## 2019-03-25 ENCOUNTER — Telehealth: Payer: Self-pay

## 2019-03-25 DIAGNOSIS — R278 Other lack of coordination: Secondary | ICD-10-CM | POA: Diagnosis not present

## 2019-03-25 DIAGNOSIS — R260 Ataxic gait: Secondary | ICD-10-CM | POA: Diagnosis not present

## 2019-03-25 DIAGNOSIS — M6281 Muscle weakness (generalized): Secondary | ICD-10-CM | POA: Diagnosis not present

## 2019-03-25 DIAGNOSIS — R2689 Other abnormalities of gait and mobility: Secondary | ICD-10-CM | POA: Diagnosis not present

## 2019-03-25 DIAGNOSIS — R41841 Cognitive communication deficit: Secondary | ICD-10-CM | POA: Diagnosis not present

## 2019-03-25 DIAGNOSIS — R2681 Unsteadiness on feet: Secondary | ICD-10-CM | POA: Diagnosis not present

## 2019-03-25 NOTE — Telephone Encounter (Signed)
-----   Message from Biagio Borg, MD sent at 03/24/2019  5:00 PM EDT ----- Left message on MyChart, pt to cont same tx except  The test results show that your current treatment is OK, except the urine culture is abnormal and showing the cipro antibiotic will not work well.  We need to change to nitrofurantoin, and I will send the prescription.Redmond Baseman to please inform pt family, I will do rx (and ok to not take the cipro if she has already picked it up)

## 2019-03-25 NOTE — Telephone Encounter (Signed)
Pt has viewed results via MyChart  

## 2019-03-26 DIAGNOSIS — R2689 Other abnormalities of gait and mobility: Secondary | ICD-10-CM | POA: Diagnosis not present

## 2019-03-26 DIAGNOSIS — M6281 Muscle weakness (generalized): Secondary | ICD-10-CM | POA: Diagnosis not present

## 2019-03-26 DIAGNOSIS — R278 Other lack of coordination: Secondary | ICD-10-CM | POA: Diagnosis not present

## 2019-03-26 DIAGNOSIS — R2681 Unsteadiness on feet: Secondary | ICD-10-CM | POA: Diagnosis not present

## 2019-03-26 DIAGNOSIS — R41841 Cognitive communication deficit: Secondary | ICD-10-CM | POA: Diagnosis not present

## 2019-03-26 DIAGNOSIS — R260 Ataxic gait: Secondary | ICD-10-CM | POA: Diagnosis not present

## 2019-03-27 DIAGNOSIS — R278 Other lack of coordination: Secondary | ICD-10-CM | POA: Diagnosis not present

## 2019-03-27 DIAGNOSIS — I69328 Other speech and language deficits following cerebral infarction: Secondary | ICD-10-CM | POA: Diagnosis not present

## 2019-03-27 DIAGNOSIS — R2681 Unsteadiness on feet: Secondary | ICD-10-CM | POA: Diagnosis not present

## 2019-03-27 DIAGNOSIS — I69252 Hemiplegia and hemiparesis following other nontraumatic intracranial hemorrhage affecting left dominant side: Secondary | ICD-10-CM | POA: Diagnosis not present

## 2019-03-27 DIAGNOSIS — R41841 Cognitive communication deficit: Secondary | ICD-10-CM | POA: Diagnosis not present

## 2019-03-27 DIAGNOSIS — R2689 Other abnormalities of gait and mobility: Secondary | ICD-10-CM | POA: Diagnosis not present

## 2019-03-27 DIAGNOSIS — M6281 Muscle weakness (generalized): Secondary | ICD-10-CM | POA: Diagnosis not present

## 2019-03-27 DIAGNOSIS — R260 Ataxic gait: Secondary | ICD-10-CM | POA: Diagnosis not present

## 2019-03-28 ENCOUNTER — Encounter: Payer: Self-pay | Admitting: Cardiology

## 2019-03-28 DIAGNOSIS — R278 Other lack of coordination: Secondary | ICD-10-CM | POA: Diagnosis not present

## 2019-03-28 DIAGNOSIS — M6281 Muscle weakness (generalized): Secondary | ICD-10-CM | POA: Diagnosis not present

## 2019-03-28 DIAGNOSIS — R2681 Unsteadiness on feet: Secondary | ICD-10-CM | POA: Diagnosis not present

## 2019-03-28 DIAGNOSIS — R260 Ataxic gait: Secondary | ICD-10-CM | POA: Diagnosis not present

## 2019-03-28 DIAGNOSIS — R41841 Cognitive communication deficit: Secondary | ICD-10-CM | POA: Diagnosis not present

## 2019-03-28 DIAGNOSIS — R2689 Other abnormalities of gait and mobility: Secondary | ICD-10-CM | POA: Diagnosis not present

## 2019-03-28 NOTE — Telephone Encounter (Signed)
Call placed to [facility].  After discussion, decided it would be best overall for Pt to come to office to get set up of gen change (would be able to get labs and surgical scrub)  Pt is scheduled for appt with Dr. Lovena Le on July 16 to discuss gen change.  Will schedule for gen change on July 20 third case per Wells Guiles d/t Pt needs.  Will need rapid covid test upon arrival d/t Pt living in assisted living facility.

## 2019-03-28 NOTE — Progress Notes (Signed)
Remote pacemaker transmission.   

## 2019-03-29 DIAGNOSIS — R260 Ataxic gait: Secondary | ICD-10-CM | POA: Diagnosis not present

## 2019-03-29 DIAGNOSIS — M6281 Muscle weakness (generalized): Secondary | ICD-10-CM | POA: Diagnosis not present

## 2019-03-29 DIAGNOSIS — R41841 Cognitive communication deficit: Secondary | ICD-10-CM | POA: Diagnosis not present

## 2019-03-29 DIAGNOSIS — R278 Other lack of coordination: Secondary | ICD-10-CM | POA: Diagnosis not present

## 2019-03-29 DIAGNOSIS — R2689 Other abnormalities of gait and mobility: Secondary | ICD-10-CM | POA: Diagnosis not present

## 2019-03-29 DIAGNOSIS — R2681 Unsteadiness on feet: Secondary | ICD-10-CM | POA: Diagnosis not present

## 2019-04-01 ENCOUNTER — Telehealth: Payer: Self-pay | Admitting: Internal Medicine

## 2019-04-01 DIAGNOSIS — R260 Ataxic gait: Secondary | ICD-10-CM | POA: Diagnosis not present

## 2019-04-01 DIAGNOSIS — R2689 Other abnormalities of gait and mobility: Secondary | ICD-10-CM | POA: Diagnosis not present

## 2019-04-01 DIAGNOSIS — M6281 Muscle weakness (generalized): Secondary | ICD-10-CM | POA: Diagnosis not present

## 2019-04-01 DIAGNOSIS — R41841 Cognitive communication deficit: Secondary | ICD-10-CM | POA: Diagnosis not present

## 2019-04-01 DIAGNOSIS — R278 Other lack of coordination: Secondary | ICD-10-CM | POA: Diagnosis not present

## 2019-04-01 DIAGNOSIS — R2681 Unsteadiness on feet: Secondary | ICD-10-CM | POA: Diagnosis not present

## 2019-04-01 NOTE — Telephone Encounter (Signed)
[name], Daughter of patient was calling to confirm the location of the patient's upcoming pacemaker battery change . She needs to set up transportation from the patient's [place] ([place]) , and needs an address for the transportation driver.

## 2019-04-01 NOTE — Telephone Encounter (Signed)
Returned call to daughter.  Directions given.  Confirmed arrival time.

## 2019-04-02 DIAGNOSIS — M6281 Muscle weakness (generalized): Secondary | ICD-10-CM | POA: Diagnosis not present

## 2019-04-02 DIAGNOSIS — R2689 Other abnormalities of gait and mobility: Secondary | ICD-10-CM | POA: Diagnosis not present

## 2019-04-02 DIAGNOSIS — R41841 Cognitive communication deficit: Secondary | ICD-10-CM | POA: Diagnosis not present

## 2019-04-02 DIAGNOSIS — R278 Other lack of coordination: Secondary | ICD-10-CM | POA: Diagnosis not present

## 2019-04-02 DIAGNOSIS — R260 Ataxic gait: Secondary | ICD-10-CM | POA: Diagnosis not present

## 2019-04-02 DIAGNOSIS — R2681 Unsteadiness on feet: Secondary | ICD-10-CM | POA: Diagnosis not present

## 2019-04-03 DIAGNOSIS — R2689 Other abnormalities of gait and mobility: Secondary | ICD-10-CM | POA: Diagnosis not present

## 2019-04-03 DIAGNOSIS — R2681 Unsteadiness on feet: Secondary | ICD-10-CM | POA: Diagnosis not present

## 2019-04-03 DIAGNOSIS — M6281 Muscle weakness (generalized): Secondary | ICD-10-CM | POA: Diagnosis not present

## 2019-04-03 DIAGNOSIS — R278 Other lack of coordination: Secondary | ICD-10-CM | POA: Diagnosis not present

## 2019-04-03 DIAGNOSIS — R260 Ataxic gait: Secondary | ICD-10-CM | POA: Diagnosis not present

## 2019-04-03 DIAGNOSIS — R41841 Cognitive communication deficit: Secondary | ICD-10-CM | POA: Diagnosis not present

## 2019-04-04 DIAGNOSIS — R278 Other lack of coordination: Secondary | ICD-10-CM | POA: Diagnosis not present

## 2019-04-04 DIAGNOSIS — R41841 Cognitive communication deficit: Secondary | ICD-10-CM | POA: Diagnosis not present

## 2019-04-04 DIAGNOSIS — R2681 Unsteadiness on feet: Secondary | ICD-10-CM | POA: Diagnosis not present

## 2019-04-04 DIAGNOSIS — R260 Ataxic gait: Secondary | ICD-10-CM | POA: Diagnosis not present

## 2019-04-04 DIAGNOSIS — M6281 Muscle weakness (generalized): Secondary | ICD-10-CM | POA: Diagnosis not present

## 2019-04-04 DIAGNOSIS — R2689 Other abnormalities of gait and mobility: Secondary | ICD-10-CM | POA: Diagnosis not present

## 2019-04-05 DIAGNOSIS — M6281 Muscle weakness (generalized): Secondary | ICD-10-CM | POA: Diagnosis not present

## 2019-04-05 DIAGNOSIS — R2681 Unsteadiness on feet: Secondary | ICD-10-CM | POA: Diagnosis not present

## 2019-04-05 DIAGNOSIS — R278 Other lack of coordination: Secondary | ICD-10-CM | POA: Diagnosis not present

## 2019-04-05 DIAGNOSIS — R2689 Other abnormalities of gait and mobility: Secondary | ICD-10-CM | POA: Diagnosis not present

## 2019-04-05 DIAGNOSIS — R41841 Cognitive communication deficit: Secondary | ICD-10-CM | POA: Diagnosis not present

## 2019-04-05 DIAGNOSIS — R260 Ataxic gait: Secondary | ICD-10-CM | POA: Diagnosis not present

## 2019-04-08 DIAGNOSIS — R2681 Unsteadiness on feet: Secondary | ICD-10-CM | POA: Diagnosis not present

## 2019-04-08 DIAGNOSIS — R2689 Other abnormalities of gait and mobility: Secondary | ICD-10-CM | POA: Diagnosis not present

## 2019-04-08 DIAGNOSIS — R260 Ataxic gait: Secondary | ICD-10-CM | POA: Diagnosis not present

## 2019-04-08 DIAGNOSIS — R278 Other lack of coordination: Secondary | ICD-10-CM | POA: Diagnosis not present

## 2019-04-08 DIAGNOSIS — M6281 Muscle weakness (generalized): Secondary | ICD-10-CM | POA: Diagnosis not present

## 2019-04-08 DIAGNOSIS — R41841 Cognitive communication deficit: Secondary | ICD-10-CM | POA: Diagnosis not present

## 2019-04-09 DIAGNOSIS — R2681 Unsteadiness on feet: Secondary | ICD-10-CM | POA: Diagnosis not present

## 2019-04-09 DIAGNOSIS — R278 Other lack of coordination: Secondary | ICD-10-CM | POA: Diagnosis not present

## 2019-04-09 DIAGNOSIS — R41841 Cognitive communication deficit: Secondary | ICD-10-CM | POA: Diagnosis not present

## 2019-04-09 DIAGNOSIS — R260 Ataxic gait: Secondary | ICD-10-CM | POA: Diagnosis not present

## 2019-04-09 DIAGNOSIS — R2689 Other abnormalities of gait and mobility: Secondary | ICD-10-CM | POA: Diagnosis not present

## 2019-04-09 DIAGNOSIS — M6281 Muscle weakness (generalized): Secondary | ICD-10-CM | POA: Diagnosis not present

## 2019-04-10 ENCOUNTER — Telehealth: Payer: Self-pay | Admitting: Internal Medicine

## 2019-04-10 DIAGNOSIS — R260 Ataxic gait: Secondary | ICD-10-CM | POA: Diagnosis not present

## 2019-04-10 DIAGNOSIS — R41841 Cognitive communication deficit: Secondary | ICD-10-CM | POA: Diagnosis not present

## 2019-04-10 DIAGNOSIS — R2681 Unsteadiness on feet: Secondary | ICD-10-CM | POA: Diagnosis not present

## 2019-04-10 DIAGNOSIS — R278 Other lack of coordination: Secondary | ICD-10-CM | POA: Diagnosis not present

## 2019-04-10 DIAGNOSIS — M6281 Muscle weakness (generalized): Secondary | ICD-10-CM | POA: Diagnosis not present

## 2019-04-10 DIAGNOSIS — R2689 Other abnormalities of gait and mobility: Secondary | ICD-10-CM | POA: Diagnosis not present

## 2019-04-10 NOTE — Telephone Encounter (Signed)
New Message         COVID-19 Pre-Screening Questions:   In the past 7 to 10 days have you had a cough,  shortness of breath, headache, congestion, fever (100 or greater) body aches, chills, sore throat, or sudden loss of taste or sense of smell? NO  Have you been around anyone with known Covid 19. NO  Have you been around anyone who is awaiting Covid 19 test results in the past 7 to 10 days? NO  Have you been around anyone who has been exposed to Covid 19, or has mentioned symptoms of Covid 19 within the past 7 to 10 days? NO Pts daughter in law is bringing her for assistance and she answers NO to all COVID questions   If you have any concerns/questions about symptoms patients report during screening (either on the phone or at threshold). Contact the provider seeing the patient or DOD for further guidance.  If neither are available contact a member of the leadership team.

## 2019-04-11 ENCOUNTER — Encounter: Payer: Self-pay | Admitting: Internal Medicine

## 2019-04-11 ENCOUNTER — Ambulatory Visit (INDEPENDENT_AMBULATORY_CARE_PROVIDER_SITE_OTHER): Payer: Medicare Other | Admitting: Internal Medicine

## 2019-04-11 ENCOUNTER — Other Ambulatory Visit: Payer: Self-pay

## 2019-04-11 VITALS — BP 116/64 | HR 65 | Ht 64.0 in | Wt 149.2 lb

## 2019-04-11 DIAGNOSIS — R260 Ataxic gait: Secondary | ICD-10-CM | POA: Diagnosis not present

## 2019-04-11 DIAGNOSIS — I4821 Permanent atrial fibrillation: Secondary | ICD-10-CM | POA: Diagnosis not present

## 2019-04-11 DIAGNOSIS — R278 Other lack of coordination: Secondary | ICD-10-CM | POA: Diagnosis not present

## 2019-04-11 DIAGNOSIS — I63512 Cerebral infarction due to unspecified occlusion or stenosis of left middle cerebral artery: Secondary | ICD-10-CM | POA: Diagnosis not present

## 2019-04-11 DIAGNOSIS — R2689 Other abnormalities of gait and mobility: Secondary | ICD-10-CM | POA: Diagnosis not present

## 2019-04-11 DIAGNOSIS — Z950 Presence of cardiac pacemaker: Secondary | ICD-10-CM

## 2019-04-11 DIAGNOSIS — M6281 Muscle weakness (generalized): Secondary | ICD-10-CM | POA: Diagnosis not present

## 2019-04-11 DIAGNOSIS — I442 Atrioventricular block, complete: Secondary | ICD-10-CM

## 2019-04-11 DIAGNOSIS — R41841 Cognitive communication deficit: Secondary | ICD-10-CM | POA: Diagnosis not present

## 2019-04-11 DIAGNOSIS — R2681 Unsteadiness on feet: Secondary | ICD-10-CM | POA: Diagnosis not present

## 2019-04-11 LAB — BASIC METABOLIC PANEL
BUN/Creatinine Ratio: 16 (ref 12–28)
BUN: 17 mg/dL (ref 10–36)
CO2: 25 mmol/L (ref 20–29)
Calcium: 9 mg/dL (ref 8.7–10.3)
Chloride: 99 mmol/L (ref 96–106)
Creatinine, Ser: 1.05 mg/dL — ABNORMAL HIGH (ref 0.57–1.00)
GFR calc Af Amer: 53 mL/min/{1.73_m2} — ABNORMAL LOW (ref >59)
GFR calc non Af Amer: 46 mL/min/{1.73_m2} — ABNORMAL LOW (ref >59)
Glucose: 85 mg/dL (ref 65–99)
Potassium: 4 mmol/L (ref 3.5–5.2)
Sodium: 137 mmol/L (ref 134–144)

## 2019-04-11 LAB — CBC WITH DIFFERENTIAL/PLATELET
Basophils Absolute: 0.1 10*3/uL (ref 0.0–0.2)
Basos: 1 %
EOS (ABSOLUTE): 0.2 10*3/uL (ref 0.0–0.4)
Eos: 3 %
Hematocrit: 41.5 % (ref 34.0–46.6)
Hemoglobin: 14 g/dL (ref 11.1–15.9)
Immature Grans (Abs): 0 10*3/uL (ref 0.0–0.1)
Immature Granulocytes: 0 %
Lymphocytes Absolute: 2 10*3/uL (ref 0.7–3.1)
Lymphs: 28 %
MCH: 31.1 pg (ref 26.6–33.0)
MCHC: 33.7 g/dL (ref 31.5–35.7)
MCV: 92 fL (ref 79–97)
Monocytes Absolute: 0.7 10*3/uL (ref 0.1–0.9)
Monocytes: 10 %
Neutrophils Absolute: 4.1 10*3/uL (ref 1.4–7.0)
Neutrophils: 58 %
Platelets: 312 10*3/uL (ref 150–450)
RBC: 4.5 x10E6/uL (ref 3.77–5.28)
RDW: 13.1 % (ref 11.7–15.4)
WBC: 7.1 10*3/uL (ref 3.4–10.8)

## 2019-04-11 NOTE — H&P (View-Only) (Signed)
HPI [name] returns today for followup. She is a pleasant, demented 85 yo woman with a h/o CHB, s/p PPM, multiple strokes who had a recent stroke back in March. She has struggled in rehab. She was not thought to be a candidate for systemic anti-coagulation but was placed on Eliquis. She has not had any bleeding. She has reached [facility] on her PPM. She is dependent. Her grandaughter is with her today and would like for her to have her device changed out. Allergies  Allergen Reactions  . Codeine Rash     Current Outpatient Medications  Medication Sig Dispense Refill  . ALPRAZolam (XANAX) 0.25 MG tablet Take 0.25 mg by mouth daily as needed for anxiety.    Marland Kitchen amLODipine (NORVASC) 10 MG tablet TAKE 1 TABLET BY MOUTH DAILY. RESUME IN 3 DAYS IS SYSTOLIC BLOOD PRESSURE IS GREATER THAN 150. (Patient taking differently: Take 10 mg by mouth daily. ) 90 tablet 1  . apixaban (ELIQUIS) 5 MG TABS tablet Take 1 tablet (5 mg total) by mouth 2 (two) times daily. 60 tablet 2  . Calcium Carbonate-Vitamin D (CALTRATE 600+D) 600-400 MG-UNIT per tablet Take 1 tablet by mouth at bedtime.     . docusate sodium (COLACE) 100 MG capsule Take 100 mg by mouth daily.    Marland Kitchen donepezil (ARICEPT) 10 MG tablet TAKE 1 TABLET(10 MG) BY MOUTH DAILY 90 tablet 1  . levothyroxine (SYNTHROID) 125 MCG tablet TAKE 1 TABLET(125 MCG) BY MOUTH DAILY (Patient taking differently: Take 125 mcg by mouth daily before breakfast. ) 90 tablet 1  . Multiple Vitamins-Minerals (CENTRUM SILVER 50+WOMEN) TABS Take 1 tablet by mouth daily.    . nitrofurantoin, macrocrystal-monohydrate, (MACROBID) 100 MG capsule Take 1 capsule (100 mg total) by mouth 2 (two) times daily. 20 capsule 0  . pantoprazole (PROTONIX) 40 MG tablet Take 1 tablet (40 mg total) by mouth daily. 90 tablet 1  . polyvinyl alcohol (LIQUIFILM TEARS) 1.4 % ophthalmic solution Place 1 drop into both eyes 3 (three) times daily.    . potassium chloride 20 MEQ/15ML (10%) SOLN Take 15  mLs (20 mEq total) by mouth daily. 473 mL 0  . pravastatin (PRAVACHOL) 10 MG tablet Take 1 tablet (10 mg total) by mouth at bedtime. 90 tablet 1  . valsartan (DIOVAN) 80 MG tablet Take 1 tablet (80 mg total) by mouth daily. Take 80 mg by mouth daily. (Patient taking differently: Take 80 mg by mouth daily. ) 90 tablet 1   No current facility-administered medications for this visit.      Past Medical History:  Diagnosis Date  . Aphasia 01/09/2009  . Arthritis    right hand  . 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 CEREBROVASCULAR ACCIDENT, HX OF 11/20/2007  . CHF (congestive heart failure) ([place])   . Coronary artery disease   . CVA 01/09/2009  . DEMENTIA 05/19/2008  . Dementia ([place])   . DIVERTICULOSIS, COLON 11/20/2007  . FATIGUE 11/20/2007  . Femoral fracture ([place]) 07/01/2013  . GLUCOSE INTOLERANCE 12/06/2010  . HYPERLIPIDEMIA 11/20/2007  . HYPERTENSION 11/20/2007   Echo was done 07/07/11: mod LVH, EF 55-60%, grade 1 diast dysfxn, mild MR, mild LAE, mod TR, PASP 39  . HYPOTHYROIDISM 11/20/2007  . Impaired glucose tolerance 06/03/2011  . IRON DEFICIENCY 12/06/2010  . Pacemaker 2002  . PACEMAKER, PERMANENT 01/09/2009  . TIA 05/14/2003   elevated CE's in setting of TIA and falls in 10/12 - echo normal with no further cardiac w/u pursued  .  TIA (transient ischemic attack) 10/10/2011  . Urinary incontinence 01/09/2009    ROS:   All systems reviewed and negative except as noted in the HPI.   Past Surgical History:  Procedure Laterality Date  . ABDOMINAL HYSTERECTOMY    . CHOLECYSTECTOMY    . CYSTOCELE REPAIR N/A 11/26/2012   Procedure: Cystocele Repair with Lefort Colpocleisis.  colpectomy Levatorplasty Perineorraphy;  Surgeon: Lovenia Kim, MD;  Location: [facility];  Service: Gynecology;  Laterality: N/A;  Cystocele Repair with Lefort Colpocleisis.  Marland Kitchen HIP ARTHROPLASTY Left 07/02/2013   Procedure: ARTHROPLASTY BIPOLAR HIP;  Surgeon: Mauri Pole, MD;  Location: [facility];  Service: Orthopedics;   Laterality: Left;  . INGUINAL HERNIA REPAIR Right 08/13/2013   Procedure: HERNIA REPAIR INGUINAL INCARCERATED;  Surgeon: Ralene Ok, MD;  Location: [facility];  Service: General;  Laterality: Right;  . Medtronic Cynthia dual chamber pacemaker serial number was [id number] H...04/15/2009    . s/p cerebral aneurysm  1991  . s/p pacemaker DDD    . s/p retinal attachment    . s/p thyroidectomy       Family History  Problem Relation Age of Onset  . Cancer Mother        breast cancer     Social History   Socioeconomic History  . Marital status: Divorced    Spouse name: Not on file  . Number of children: Not on file  . Years of education: Not on file  . Highest education level: Not on file  Occupational History  . Occupation: retired Radiation protection practitioner, lives at [employer]  . Financial resource strain: Not on file  . Food insecurity    Worry: Not on file    Inability: Not on file  . Transportation needs    Medical: Not on file    Non-medical: Not on file  Tobacco Use  . Smoking status: Never Smoker  . Smokeless tobacco: Never Used  Substance and Sexual Activity  . Alcohol use: No  . Drug use: No  . Sexual activity: Not on file  Lifestyle  . Physical activity    Days per week: Not on file    Minutes per session: Not on file  . Stress: Not on file  Relationships  . Social Herbalist on phone: Not on file    Gets together: Not on file    Attends religious service: Not on file    Active member of club or organization: Not on file    Attends meetings of clubs or organizations: Not on file    Relationship status: Not on file  . Intimate partner violence    Fear of current or ex partner: Not on file    Emotionally abused: Not on file    Physically abused: Not on file    Forced sexual activity: Not on file  Other Topics Concern  . Not on file  Social History Narrative  . Not on file     BP 116/64   Pulse 65   Ht 5\' 4"  (1.626 m)   Wt 149 lb 3.2 oz  (67.7 kg)   SpO2 96%   BMI 25.61 kg/m   Physical Exam:  Well appearing NAD HEENT: Unremarkable Neck:  No JVD, no thyromegally Lymphatics:  No adenopathy Back:  No CVA tenderness Lungs:  Clear HEART:  Regular rate rhythm, no murmurs, no rubs, no clicks Abd:  soft, positive bowel sounds, no organomegally, no rebound, no guarding Ext:  2 plus pulses, no edema,  no cyanosis, no clubbing Skin:  No rashes no nodules Neuro:  CN II through XII intact, motor grossly intact  EKG - atrial fib with ventricular pacing  Assess/Plan: 1. CHB - she is PM dependent and will undergo gen change out in the next few days. 2. PPM - her medtronic device is at [facility]. 3. Stroke - she has right HP. She is in a wheel chair. 4. Dementia - we will not plan to sedate her for the procedure.  Mikle Bosworth.D.

## 2019-04-11 NOTE — Progress Notes (Signed)
HPI [name] returns today for followup. She is a pleasant, demented 85 yo woman with a h/o CHB, s/p PPM, multiple strokes who had a recent stroke back in March. She has struggled in rehab. She was not thought to be a candidate for systemic anti-coagulation but was placed on Eliquis. She has not had any bleeding. She has reached ERI on her PPM. She is dependent. Her grandaughter is with her today and would like for her to have her device changed out. Allergies  Allergen Reactions  . Codeine Rash     Current Outpatient Medications  Medication Sig Dispense Refill  . ALPRAZolam (XANAX) 0.25 MG tablet Take 0.25 mg by mouth daily as needed for anxiety.    Marland Kitchen amLODipine (NORVASC) 10 MG tablet TAKE 1 TABLET BY MOUTH DAILY. RESUME IN 3 DAYS IS SYSTOLIC BLOOD PRESSURE IS GREATER THAN 150. (Patient taking differently: Take 10 mg by mouth daily. ) 90 tablet 1  . apixaban (ELIQUIS) 5 MG TABS tablet Take 1 tablet (5 mg total) by mouth 2 (two) times daily. 60 tablet 2  . Calcium Carbonate-Vitamin D (CALTRATE 600+D) 600-400 MG-UNIT per tablet Take 1 tablet by mouth at bedtime.     . docusate sodium (COLACE) 100 MG capsule Take 100 mg by mouth daily.    Marland Kitchen donepezil (ARICEPT) 10 MG tablet TAKE 1 TABLET(10 MG) BY MOUTH DAILY 90 tablet 1  . levothyroxine (SYNTHROID) 125 MCG tablet TAKE 1 TABLET(125 MCG) BY MOUTH DAILY (Patient taking differently: Take 125 mcg by mouth daily before breakfast. ) 90 tablet 1  . Multiple Vitamins-Minerals (CENTRUM SILVER 50+WOMEN) TABS Take 1 tablet by mouth daily.    . nitrofurantoin, macrocrystal-monohydrate, (MACROBID) 100 MG capsule Take 1 capsule (100 mg total) by mouth 2 (two) times daily. 20 capsule 0  . pantoprazole (PROTONIX) 40 MG tablet Take 1 tablet (40 mg total) by mouth daily. 90 tablet 1  . polyvinyl alcohol (LIQUIFILM TEARS) 1.4 % ophthalmic solution Place 1 drop into both eyes 3 (three) times daily.    . potassium chloride 20 MEQ/15ML (10%) SOLN Take 15  mLs (20 mEq total) by mouth daily. 473 mL 0  . pravastatin (PRAVACHOL) 10 MG tablet Take 1 tablet (10 mg total) by mouth at bedtime. 90 tablet 1  . valsartan (DIOVAN) 80 MG tablet Take 1 tablet (80 mg total) by mouth daily. Take 80 mg by mouth daily. (Patient taking differently: Take 80 mg by mouth daily. ) 90 tablet 1   No current facility-administered medications for this visit.      Past Medical History:  Diagnosis Date  . Aphasia 01/09/2009  . Arthritis    right hand  . 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 CEREBROVASCULAR ACCIDENT, HX OF 11/20/2007  . CHF (congestive heart failure) ([place])   . Coronary artery disease   . CVA 01/09/2009  . DEMENTIA 05/19/2008  . Dementia ([place])   . DIVERTICULOSIS, COLON 11/20/2007  . FATIGUE 11/20/2007  . Femoral fracture ([place]) 07/01/2013  . GLUCOSE INTOLERANCE 12/06/2010  . HYPERLIPIDEMIA 11/20/2007  . HYPERTENSION 11/20/2007   Echo was done 07/07/11: mod LVH, EF 55-60%, grade 1 diast dysfxn, mild MR, mild LAE, mod TR, PASP 39  . HYPOTHYROIDISM 11/20/2007  . Impaired glucose tolerance 06/03/2011  . IRON DEFICIENCY 12/06/2010  . Pacemaker 2002  . PACEMAKER, PERMANENT 01/09/2009  . TIA 05/14/2003   elevated CE's in setting of TIA and falls in 10/12 - echo normal with no further cardiac w/u pursued  .  TIA (transient ischemic attack) 10/10/2011  . Urinary incontinence 01/09/2009    ROS:   All systems reviewed and negative except as noted in the HPI.   Past Surgical History:  Procedure Laterality Date  . ABDOMINAL HYSTERECTOMY    . CHOLECYSTECTOMY    . CYSTOCELE REPAIR N/A 11/26/2012   Procedure: Cystocele Repair with Lefort Colpocleisis.  colpectomy Levatorplasty Perineorraphy;  Surgeon: Lovenia Kim, MD;  Location: [facility];  Service: Gynecology;  Laterality: N/A;  Cystocele Repair with Lefort Colpocleisis.  Marland Kitchen HIP ARTHROPLASTY Left 07/02/2013   Procedure: ARTHROPLASTY BIPOLAR HIP;  Surgeon: Mauri Pole, MD;  Location: [facility];  Service: Orthopedics;   Laterality: Left;  . INGUINAL HERNIA REPAIR Right 08/13/2013   Procedure: HERNIA REPAIR INGUINAL INCARCERATED;  Surgeon: Ralene Ok, MD;  Location: [facility];  Service: General;  Laterality: Right;  . Medtronic Cynthia dual chamber pacemaker serial number was [id number] H...04/15/2009    . s/p cerebral aneurysm  1991  . s/p pacemaker DDD    . s/p retinal attachment    . s/p thyroidectomy       Family History  Problem Relation Age of Onset  . Cancer Mother        breast cancer     Social History   Socioeconomic History  . Marital status: Divorced    Spouse name: Not on file  . Number of children: Not on file  . Years of education: Not on file  . Highest education level: Not on file  Occupational History  . Occupation: retired Radiation protection practitioner, lives at [employer]  . Financial resource strain: Not on file  . Food insecurity    Worry: Not on file    Inability: Not on file  . Transportation needs    Medical: Not on file    Non-medical: Not on file  Tobacco Use  . Smoking status: Never Smoker  . Smokeless tobacco: Never Used  Substance and Sexual Activity  . Alcohol use: No  . Drug use: No  . Sexual activity: Not on file  Lifestyle  . Physical activity    Days per week: Not on file    Minutes per session: Not on file  . Stress: Not on file  Relationships  . Social Herbalist on phone: Not on file    Gets together: Not on file    Attends religious service: Not on file    Active member of club or organization: Not on file    Attends meetings of clubs or organizations: Not on file    Relationship status: Not on file  . Intimate partner violence    Fear of current or ex partner: Not on file    Emotionally abused: Not on file    Physically abused: Not on file    Forced sexual activity: Not on file  Other Topics Concern  . Not on file  Social History Narrative  . Not on file     BP 116/64   Pulse 65   Ht 5\' 4"  (1.626 m)   Wt 149 lb 3.2 oz  (67.7 kg)   SpO2 96%   BMI 25.61 kg/m   Physical Exam:  Well appearing NAD HEENT: Unremarkable Neck:  No JVD, no thyromegally Lymphatics:  No adenopathy Back:  No CVA tenderness Lungs:  Clear HEART:  Regular rate rhythm, no murmurs, no rubs, no clicks Abd:  soft, positive bowel sounds, no organomegally, no rebound, no guarding Ext:  2 plus pulses, no edema,  no cyanosis, no clubbing Skin:  No rashes no nodules Neuro:  CN II through XII intact, motor grossly intact  EKG - atrial fib with ventricular pacing  Assess/Plan: 1. CHB - she is PM dependent and will undergo gen change out in the next few days. 2. PPM - her medtronic device is at [facility]. 3. Stroke - she has right HP. She is in a wheel chair. 4. Dementia - we will not plan to sedate her for the procedure.  Mikle Bosworth.D.

## 2019-04-11 NOTE — Patient Instructions (Addendum)
Medication Instructions:  Your physician recommends that you continue on your current medications as directed. Please refer to the Current Medication list given to you today.  Labwork: You will get lab work today:  BMP and CBC.  Testing/Procedures: Your physician has recommended that you have a pacemaker generator change.  Follow-Up: You will follow up with device clinic 10-14 days after your procedure for a wound check.  You will follow up with Dr. Lovena Le 91 days after your procedure.  These appointments have been made for you-see next page   PACEMAKER GENERATOR CHANGE INSTRUCTIONS:  Please arrive to ADMITTING down the hall from the [facility] main entrance of [facility] at:  8:30 am on April 15, 2019  Use the CHG surgical scrub as directed  Do not eat or drink after midnight prior to procedure  Eliquis-Hold for 2 days prior to your procedure.  Your last dose will be July 17 your evending dose  Do not take any medications the morning of the procedure EXCEPT YOU MAY take your SYNTHROID  You will be discharged after your procedure.

## 2019-04-12 DIAGNOSIS — R260 Ataxic gait: Secondary | ICD-10-CM | POA: Diagnosis not present

## 2019-04-12 DIAGNOSIS — R2681 Unsteadiness on feet: Secondary | ICD-10-CM | POA: Diagnosis not present

## 2019-04-12 DIAGNOSIS — R278 Other lack of coordination: Secondary | ICD-10-CM | POA: Diagnosis not present

## 2019-04-12 DIAGNOSIS — R41841 Cognitive communication deficit: Secondary | ICD-10-CM | POA: Diagnosis not present

## 2019-04-12 DIAGNOSIS — M6281 Muscle weakness (generalized): Secondary | ICD-10-CM | POA: Diagnosis not present

## 2019-04-12 DIAGNOSIS — R2689 Other abnormalities of gait and mobility: Secondary | ICD-10-CM | POA: Diagnosis not present

## 2019-04-15 ENCOUNTER — Ambulatory Visit (HOSPITAL_COMMUNITY)
Admission: RE | Disposition: A | Discharge status: Home / Self Care | Payer: Medicare Other | Source: Home / Self Care | Attending: Internal Medicine

## 2019-04-15 ENCOUNTER — Other Ambulatory Visit: Payer: Self-pay

## 2019-04-15 ENCOUNTER — Ambulatory Visit (HOSPITAL_COMMUNITY)
Admission: RE | Admit: 2019-04-15 | Discharge: 2019-04-15 | Disposition: A | Discharge status: Home / Self Care | Payer: Medicare Other | Attending: Internal Medicine | Admitting: Internal Medicine

## 2019-04-15 ENCOUNTER — Other Ambulatory Visit (HOSPITAL_COMMUNITY): Payer: Self-pay

## 2019-04-15 ENCOUNTER — Other Ambulatory Visit (HOSPITAL_COMMUNITY)
Admission: RE | Admit: 2019-04-15 | Discharge: 2019-04-15 | Disposition: A | Discharge status: Home / Self Care | Payer: Medicare Other | Source: Ambulatory Visit | Attending: Internal Medicine | Admitting: Internal Medicine

## 2019-04-15 DIAGNOSIS — Z45010 Encounter for checking and testing of cardiac pacemaker pulse generator [battery]: Secondary | ICD-10-CM | POA: Diagnosis not present

## 2019-04-15 DIAGNOSIS — Z8673 Personal history of transient ischemic attack (TIA), and cerebral infarction without residual deficits: Secondary | ICD-10-CM | POA: Insufficient documentation

## 2019-04-15 DIAGNOSIS — Z90710 Acquired absence of both cervix and uterus: Secondary | ICD-10-CM | POA: Insufficient documentation

## 2019-04-15 DIAGNOSIS — Z950 Presence of cardiac pacemaker: Secondary | ICD-10-CM | POA: Diagnosis present

## 2019-04-15 DIAGNOSIS — E785 Hyperlipidemia, unspecified: Secondary | ICD-10-CM | POA: Insufficient documentation

## 2019-04-15 DIAGNOSIS — Z1159 Encounter for screening for other viral diseases: Secondary | ICD-10-CM | POA: Insufficient documentation

## 2019-04-15 DIAGNOSIS — Z885 Allergy status to narcotic agent status: Secondary | ICD-10-CM | POA: Insufficient documentation

## 2019-04-15 DIAGNOSIS — Z7901 Long term (current) use of anticoagulants: Secondary | ICD-10-CM | POA: Diagnosis not present

## 2019-04-15 DIAGNOSIS — I442 Atrioventricular block, complete: Secondary | ICD-10-CM | POA: Diagnosis present

## 2019-04-15 DIAGNOSIS — Z79899 Other long term (current) drug therapy: Secondary | ICD-10-CM | POA: Diagnosis not present

## 2019-04-15 DIAGNOSIS — I2510 Atherosclerotic heart disease of native coronary artery without angina pectoris: Secondary | ICD-10-CM | POA: Insufficient documentation

## 2019-04-15 DIAGNOSIS — E890 Postprocedural hypothyroidism: Secondary | ICD-10-CM | POA: Diagnosis not present

## 2019-04-15 DIAGNOSIS — F0390 Unspecified dementia without behavioral disturbance: Secondary | ICD-10-CM | POA: Insufficient documentation

## 2019-04-15 DIAGNOSIS — I110 Hypertensive heart disease with heart failure: Secondary | ICD-10-CM | POA: Insufficient documentation

## 2019-04-15 DIAGNOSIS — Z79890 Hormone replacement therapy (postmenopausal): Secondary | ICD-10-CM | POA: Insufficient documentation

## 2019-04-15 DIAGNOSIS — M1990 Unspecified osteoarthritis, unspecified site: Secondary | ICD-10-CM | POA: Insufficient documentation

## 2019-04-15 DIAGNOSIS — I509 Heart failure, unspecified: Secondary | ICD-10-CM | POA: Insufficient documentation

## 2019-04-15 HISTORY — PX: PPM GENERATOR CHANGEOUT: EP1233

## 2019-04-15 LAB — SURGICAL PCR SCREEN
MRSA, PCR: POSITIVE — AB
Staphylococcus aureus: POSITIVE — AB

## 2019-04-15 LAB — SARS CORONAVIRUS 2 BY RT PCR (HOSPITAL ORDER, PERFORMED IN ~~LOC~~ HOSPITAL LAB): SARS Coronavirus 2: NEGATIVE

## 2019-04-15 LAB — GLUCOSE, CAPILLARY: Glucose-Capillary: 131 mg/dL — ABNORMAL HIGH (ref 70–99)

## 2019-04-15 SURGERY — PPM GENERATOR CHANGEOUT

## 2019-04-15 MED ORDER — FUROSEMIDE 10 MG/ML IJ SOLN
INTRAMUSCULAR | Status: DC | PRN
  Administered 2019-04-15: 40 mg via INTRAVENOUS

## 2019-04-15 MED ORDER — CEFAZOLIN SODIUM-DEXTROSE 2-4 GM/100ML-% IV SOLN
INTRAVENOUS | Status: AC
  Filled 2019-04-15: qty 100

## 2019-04-15 MED ORDER — CHLORHEXIDINE GLUCONATE 4 % EX LIQD
60.0000 mL | Freq: Once | CUTANEOUS | Status: DC
  Filled 2019-04-15: qty 60

## 2019-04-15 MED ORDER — LIDOCAINE HCL (PF) 1 % IJ SOLN
INTRAMUSCULAR | Status: DC | PRN
  Administered 2019-04-15: 13:00:00 75 mL

## 2019-04-15 MED ORDER — SODIUM CHLORIDE 0.9 % IV SOLN: 80.0000 mg | INTRAVENOUS | Status: DC

## 2019-04-15 MED ORDER — LIDOCAINE HCL 1 % IJ SOLN
INTRAMUSCULAR | Status: AC
  Filled 2019-04-15: qty 40

## 2019-04-15 MED ORDER — ONDANSETRON HCL 4 MG/2ML IJ SOLN
INTRAMUSCULAR | Status: DC | PRN
  Administered 2019-04-15: 2 mg via INTRAVENOUS

## 2019-04-15 MED ORDER — CEFAZOLIN SODIUM-DEXTROSE 2-4 GM/100ML-% IV SOLN
2.0000 g | INTRAVENOUS | Status: AC
  Administered 2019-04-15: 2 g via INTRAVENOUS

## 2019-04-15 MED ORDER — FUROSEMIDE 10 MG/ML IJ SOLN
INTRAMUSCULAR | Status: AC
  Filled 2019-04-15: qty 4

## 2019-04-15 MED ORDER — SODIUM CHLORIDE 0.9 % IV SOLN
INTRAVENOUS | Status: AC
  Filled 2019-04-15: qty 2

## 2019-04-15 MED ORDER — ONDANSETRON HCL 4 MG/2ML IJ SOLN
INTRAMUSCULAR | Status: AC
  Filled 2019-04-15: qty 2

## 2019-04-15 MED ORDER — SODIUM CHLORIDE 0.9 % IV SOLN
INTRAVENOUS | Status: DC
  Administered 2019-04-15: 11:00:00 via INTRAVENOUS

## 2019-04-15 MED ORDER — MUPIROCIN 2 % EX OINT
TOPICAL_OINTMENT | CUTANEOUS | Status: AC
  Administered 2019-04-15: 1
  Filled 2019-04-15: qty 22

## 2019-04-15 MED ORDER — LIDOCAINE HCL 1 % IJ SOLN
INTRAMUSCULAR | Status: AC
  Filled 2019-04-15: qty 20

## 2019-04-15 MED ORDER — ACETAMINOPHEN 325 MG PO TABS: 325.0000 mg | ORAL_TABLET | ORAL | Status: DC | PRN

## 2019-04-15 SURGICAL SUPPLY — 5 items
CABLE SURGICAL S-101-97-12 (CABLE) ×3 IMPLANT
IPG PACE AZUR XT DR MRI W1DR01 (Pacemaker) IMPLANT
PACE AZURE XT DR MRI W1DR01 (Pacemaker) ×3 IMPLANT
PAD PRO RADIOLUCENT 2001M-C (PAD) ×3 IMPLANT
TRAY PACEMAKER INSERTION (PACKS) ×3 IMPLANT

## 2019-04-15 NOTE — Progress Notes (Signed)
Dr Lovena Le in and okay to d/c home per Dr Lovena Le

## 2019-04-15 NOTE — Interval H&P Note (Signed)
History and Physical Interval Note:  04/15/2019 12:20 PM  [name]  has presented today for surgery, with the diagnosis of eri.  The various methods of treatment have been discussed with the patient and family. After consideration of risks, benefits and other options for treatment, the patient has consented to  Procedure(s): PPM GENERATOR CHANGEOUT (N/A) as a surgical intervention.  The patient's history has been reviewed, patient examined, no change in status, stable for surgery.  I have reviewed the patient's chart and labs.  Questions were answered to the patient's satisfaction.     [name]

## 2019-04-15 NOTE — Progress Notes (Signed)
No UOP as of yet. Purewick placed. No further emesis.

## 2019-04-15 NOTE — Progress Notes (Signed)
Client awake and alert, no further apnea ; no complaints; Dr Lovena Le notified and ok to d/c home now

## 2019-04-15 NOTE — Progress Notes (Signed)
On arrival from cath lab holding client awakens to verbal stimuli; client falling asleep and noted to have 30 sec periods of apnea with O2 sats dropping to mid 80's. Paged Dr Lovena Le

## 2019-04-15 NOTE — Discharge Instructions (Signed)
Post procedure care instructions Keep incision clean and dry for 10 days. No driving for 2 days.  You can remove outer dressing tomorrow. Leave steri-strips (little pieces of tape) on until seen in the office for wound check appointment. Call the office [phone]) for redness, drainage, swelling, or fever.

## 2019-04-15 NOTE — Progress Notes (Signed)
Threw up small amt liquid bile colored emesis. Cough is loose, nonproductive. Breath sounds clear bilaterally

## 2019-04-16 ENCOUNTER — Encounter (HOSPITAL_COMMUNITY): Payer: Self-pay | Admitting: Internal Medicine

## 2019-04-16 DIAGNOSIS — R2681 Unsteadiness on feet: Secondary | ICD-10-CM | POA: Diagnosis not present

## 2019-04-16 DIAGNOSIS — M6281 Muscle weakness (generalized): Secondary | ICD-10-CM | POA: Diagnosis not present

## 2019-04-16 DIAGNOSIS — R278 Other lack of coordination: Secondary | ICD-10-CM | POA: Diagnosis not present

## 2019-04-16 DIAGNOSIS — R2689 Other abnormalities of gait and mobility: Secondary | ICD-10-CM | POA: Diagnosis not present

## 2019-04-16 DIAGNOSIS — R260 Ataxic gait: Secondary | ICD-10-CM | POA: Diagnosis not present

## 2019-04-16 DIAGNOSIS — R41841 Cognitive communication deficit: Secondary | ICD-10-CM | POA: Diagnosis not present

## 2019-04-16 MED FILL — Lidocaine HCl Local Inj 1%: INTRAMUSCULAR | Qty: 80 | Status: AC

## 2019-04-16 MED FILL — Gentamicin Sulfate Inj 40 MG/ML: INTRAMUSCULAR | Qty: 80 | Status: AC

## 2019-04-17 DIAGNOSIS — R2689 Other abnormalities of gait and mobility: Secondary | ICD-10-CM | POA: Diagnosis not present

## 2019-04-17 DIAGNOSIS — M6281 Muscle weakness (generalized): Secondary | ICD-10-CM | POA: Diagnosis not present

## 2019-04-17 DIAGNOSIS — R41841 Cognitive communication deficit: Secondary | ICD-10-CM | POA: Diagnosis not present

## 2019-04-17 DIAGNOSIS — R2681 Unsteadiness on feet: Secondary | ICD-10-CM | POA: Diagnosis not present

## 2019-04-17 DIAGNOSIS — R260 Ataxic gait: Secondary | ICD-10-CM | POA: Diagnosis not present

## 2019-04-17 DIAGNOSIS — R278 Other lack of coordination: Secondary | ICD-10-CM | POA: Diagnosis not present

## 2019-04-18 DIAGNOSIS — M6281 Muscle weakness (generalized): Secondary | ICD-10-CM | POA: Diagnosis not present

## 2019-04-18 DIAGNOSIS — R278 Other lack of coordination: Secondary | ICD-10-CM | POA: Diagnosis not present

## 2019-04-18 DIAGNOSIS — R2689 Other abnormalities of gait and mobility: Secondary | ICD-10-CM | POA: Diagnosis not present

## 2019-04-18 DIAGNOSIS — R2681 Unsteadiness on feet: Secondary | ICD-10-CM | POA: Diagnosis not present

## 2019-04-18 DIAGNOSIS — R260 Ataxic gait: Secondary | ICD-10-CM | POA: Diagnosis not present

## 2019-04-18 DIAGNOSIS — R41841 Cognitive communication deficit: Secondary | ICD-10-CM | POA: Diagnosis not present

## 2019-04-22 DIAGNOSIS — R41841 Cognitive communication deficit: Secondary | ICD-10-CM | POA: Diagnosis not present

## 2019-04-22 DIAGNOSIS — R2689 Other abnormalities of gait and mobility: Secondary | ICD-10-CM | POA: Diagnosis not present

## 2019-04-22 DIAGNOSIS — R278 Other lack of coordination: Secondary | ICD-10-CM | POA: Diagnosis not present

## 2019-04-22 DIAGNOSIS — R2681 Unsteadiness on feet: Secondary | ICD-10-CM | POA: Diagnosis not present

## 2019-04-22 DIAGNOSIS — M6281 Muscle weakness (generalized): Secondary | ICD-10-CM | POA: Diagnosis not present

## 2019-04-22 DIAGNOSIS — R260 Ataxic gait: Secondary | ICD-10-CM | POA: Diagnosis not present

## 2019-04-24 DIAGNOSIS — R2681 Unsteadiness on feet: Secondary | ICD-10-CM | POA: Diagnosis not present

## 2019-04-24 DIAGNOSIS — R41841 Cognitive communication deficit: Secondary | ICD-10-CM | POA: Diagnosis not present

## 2019-04-24 DIAGNOSIS — R2689 Other abnormalities of gait and mobility: Secondary | ICD-10-CM | POA: Diagnosis not present

## 2019-04-24 DIAGNOSIS — R278 Other lack of coordination: Secondary | ICD-10-CM | POA: Diagnosis not present

## 2019-04-24 DIAGNOSIS — R260 Ataxic gait: Secondary | ICD-10-CM | POA: Diagnosis not present

## 2019-04-24 DIAGNOSIS — M6281 Muscle weakness (generalized): Secondary | ICD-10-CM | POA: Diagnosis not present

## 2019-04-25 DIAGNOSIS — R278 Other lack of coordination: Secondary | ICD-10-CM | POA: Diagnosis not present

## 2019-04-25 DIAGNOSIS — R2681 Unsteadiness on feet: Secondary | ICD-10-CM | POA: Diagnosis not present

## 2019-04-25 DIAGNOSIS — R260 Ataxic gait: Secondary | ICD-10-CM | POA: Diagnosis not present

## 2019-04-25 DIAGNOSIS — R2689 Other abnormalities of gait and mobility: Secondary | ICD-10-CM | POA: Diagnosis not present

## 2019-04-25 DIAGNOSIS — R41841 Cognitive communication deficit: Secondary | ICD-10-CM | POA: Diagnosis not present

## 2019-04-25 DIAGNOSIS — M6281 Muscle weakness (generalized): Secondary | ICD-10-CM | POA: Diagnosis not present

## 2019-04-26 DIAGNOSIS — R278 Other lack of coordination: Secondary | ICD-10-CM | POA: Diagnosis not present

## 2019-04-26 DIAGNOSIS — R260 Ataxic gait: Secondary | ICD-10-CM | POA: Diagnosis not present

## 2019-04-26 DIAGNOSIS — R2689 Other abnormalities of gait and mobility: Secondary | ICD-10-CM | POA: Diagnosis not present

## 2019-04-26 DIAGNOSIS — R2681 Unsteadiness on feet: Secondary | ICD-10-CM | POA: Diagnosis not present

## 2019-04-26 DIAGNOSIS — R41841 Cognitive communication deficit: Secondary | ICD-10-CM | POA: Diagnosis not present

## 2019-04-26 DIAGNOSIS — M6281 Muscle weakness (generalized): Secondary | ICD-10-CM | POA: Diagnosis not present

## 2019-04-29 ENCOUNTER — Telehealth: Payer: Self-pay

## 2019-04-29 ENCOUNTER — Inpatient Hospital Stay: Payer: Medicare Other | Admitting: Adult Health

## 2019-04-29 DIAGNOSIS — R260 Ataxic gait: Secondary | ICD-10-CM | POA: Diagnosis not present

## 2019-04-29 DIAGNOSIS — R2689 Other abnormalities of gait and mobility: Secondary | ICD-10-CM | POA: Diagnosis not present

## 2019-04-29 DIAGNOSIS — R278 Other lack of coordination: Secondary | ICD-10-CM | POA: Diagnosis not present

## 2019-04-29 DIAGNOSIS — R2681 Unsteadiness on feet: Secondary | ICD-10-CM | POA: Diagnosis not present

## 2019-04-29 DIAGNOSIS — M6281 Muscle weakness (generalized): Secondary | ICD-10-CM | POA: Diagnosis not present

## 2019-04-29 DIAGNOSIS — I69328 Other speech and language deficits following cerebral infarction: Secondary | ICD-10-CM | POA: Diagnosis not present

## 2019-04-29 DIAGNOSIS — I69252 Hemiplegia and hemiparesis following other nontraumatic intracranial hemorrhage affecting left dominant side: Secondary | ICD-10-CM | POA: Diagnosis not present

## 2019-04-29 DIAGNOSIS — R41841 Cognitive communication deficit: Secondary | ICD-10-CM | POA: Diagnosis not present

## 2019-04-29 DIAGNOSIS — R4701 Aphasia: Secondary | ICD-10-CM | POA: Diagnosis not present

## 2019-04-29 NOTE — Telephone Encounter (Signed)
    COVID-19 Pre-Screening Questions:  . In the past 7 to 10 days have you had a cough,  shortness of breath, headache, congestion, fever (100 or greater) body aches, chills, sore throat, or sudden loss of taste or sense of smell? No . Have you been around anyone with known Covid 19. no . Have you been around anyone who is awaiting Covid 19 test results in the past 7 to 10 days? no . Have you been around anyone who has been exposed to Covid 19, or has mentioned symptoms of Covid 19 within the past 7 to 10 days? no  If you have any concerns/questions about symptoms patients report during screening (either on the phone or at [facility]). Contact the provider seeing the patient or DOD for further guidance.  If neither are available contact a member of the leadership team.        Pt daughter in law answered no to the covid-19 prescreening questions. I let her know the pt will have to wear a mask. I told them if the pt needs help coming into the office she can have one person with her. I also told her that whomever come with her will have to wear a mask. The pt daughter in law verbalized understanding.

## 2019-04-29 NOTE — Telephone Encounter (Signed)
Confirmed w/ pt daughter that the wound check appointment is in person for 04/30/19.

## 2019-04-30 ENCOUNTER — Other Ambulatory Visit: Payer: Self-pay

## 2019-04-30 ENCOUNTER — Ambulatory Visit (INDEPENDENT_AMBULATORY_CARE_PROVIDER_SITE_OTHER): Payer: Medicare Other | Admitting: *Deleted

## 2019-04-30 DIAGNOSIS — R2689 Other abnormalities of gait and mobility: Secondary | ICD-10-CM | POA: Diagnosis not present

## 2019-04-30 DIAGNOSIS — R278 Other lack of coordination: Secondary | ICD-10-CM | POA: Diagnosis not present

## 2019-04-30 DIAGNOSIS — I4821 Permanent atrial fibrillation: Secondary | ICD-10-CM | POA: Diagnosis not present

## 2019-04-30 DIAGNOSIS — I442 Atrioventricular block, complete: Secondary | ICD-10-CM

## 2019-04-30 DIAGNOSIS — M6281 Muscle weakness (generalized): Secondary | ICD-10-CM | POA: Diagnosis not present

## 2019-04-30 DIAGNOSIS — R2681 Unsteadiness on feet: Secondary | ICD-10-CM | POA: Diagnosis not present

## 2019-04-30 DIAGNOSIS — Z950 Presence of cardiac pacemaker: Secondary | ICD-10-CM | POA: Diagnosis not present

## 2019-04-30 DIAGNOSIS — R41841 Cognitive communication deficit: Secondary | ICD-10-CM | POA: Diagnosis not present

## 2019-04-30 DIAGNOSIS — R260 Ataxic gait: Secondary | ICD-10-CM | POA: Diagnosis not present

## 2019-04-30 LAB — CUP PACEART INCLINIC DEVICE CHECK
Battery Remaining Longevity: 129 mo
Battery Voltage: 3.19 V
Brady Statistic AP VP Percent: 0 %
Brady Statistic AP VS Percent: 0 %
Brady Statistic AS VP Percent: 0 %
Brady Statistic AS VS Percent: 0 %
Brady Statistic RA Percent Paced: 0.11 %
Brady Statistic RV Percent Paced: 99.91 %
Date Time Interrogation Session: 20200804112318
Implantable Lead Implant Date: 20020920
Implantable Lead Implant Date: 20020920
Implantable Lead Location: 753859
Implantable Lead Location: 753860
Implantable Lead Model: 5076
Implantable Lead Model: 5076
Implantable Pulse Generator Implant Date: 20200720
Lead Channel Impedance Value: 342 Ohm
Lead Channel Impedance Value: 399 Ohm
Lead Channel Impedance Value: 475 Ohm
Lead Channel Impedance Value: 551 Ohm
Lead Channel Pacing Threshold Amplitude: 1.25 V
Lead Channel Pacing Threshold Pulse Width: 0.4 ms
Lead Channel Sensing Intrinsic Amplitude: 1.875 mV
Lead Channel Setting Pacing Amplitude: 2 V
Lead Channel Setting Pacing Amplitude: 2.75 V
Lead Channel Setting Pacing Pulse Width: 0.4 ms
Lead Channel Setting Sensing Sensitivity: 4 mV

## 2019-04-30 MED ORDER — FUROSEMIDE 20 MG PO TABS: 20.0000 mg | ORAL_TABLET | Freq: Every day | ORAL | 0 refills | Status: DC

## 2019-04-30 NOTE — Progress Notes (Signed)
Wound check appointment, s/p PPM generator replacement. Steri-strips removed. Wound without redness or edema. Incision edges approximated, wound well healed. Normal device function. Thresholds, sensing, and impedances consistent with implant measurements. chronic outputs with auto capture programmed on. Histogram distribution blunted, patient is WC-bound. Persistent AF since implant, hx of permanent AF, +Eliquis. No high ventricular rates noted. Patient and family member educated about wound care, arm mobility, and Carelink monitor. ROV with Dr. Lovena Le on 07/16/19.  Patient's daughter-in-law, [name], reports BLE edema for the past few days, left leg > right leg. Patient compliant with Eliquis. Patient is a poor historian due to expressive aphasia and dementia, but [name] reports staff at ALF have not reported any other symptoms. Dr. Curt Bears assessed edema and discussed with patient and Rebecca--ordered furosemide 20mg  daily x3 days. [name] is aware to call back if symptoms do not improve.

## 2019-04-30 NOTE — Patient Instructions (Signed)
Take furosemide (Lasix) 1 tablet (20 mg) by mouth once daily for three days. Call our office [phone] if symptoms do not improve.

## 2019-05-01 ENCOUNTER — Other Ambulatory Visit: Payer: Self-pay | Admitting: Cardiology

## 2019-05-01 DIAGNOSIS — I4821 Permanent atrial fibrillation: Secondary | ICD-10-CM

## 2019-05-01 DIAGNOSIS — R2689 Other abnormalities of gait and mobility: Secondary | ICD-10-CM | POA: Diagnosis not present

## 2019-05-01 DIAGNOSIS — M6281 Muscle weakness (generalized): Secondary | ICD-10-CM | POA: Diagnosis not present

## 2019-05-01 DIAGNOSIS — R278 Other lack of coordination: Secondary | ICD-10-CM | POA: Diagnosis not present

## 2019-05-01 DIAGNOSIS — Z950 Presence of cardiac pacemaker: Secondary | ICD-10-CM

## 2019-05-01 DIAGNOSIS — R260 Ataxic gait: Secondary | ICD-10-CM | POA: Diagnosis not present

## 2019-05-01 DIAGNOSIS — R41841 Cognitive communication deficit: Secondary | ICD-10-CM | POA: Diagnosis not present

## 2019-05-01 DIAGNOSIS — R2681 Unsteadiness on feet: Secondary | ICD-10-CM | POA: Diagnosis not present

## 2019-05-01 DIAGNOSIS — I442 Atrioventricular block, complete: Secondary | ICD-10-CM

## 2019-05-02 ENCOUNTER — Other Ambulatory Visit: Payer: Self-pay | Admitting: Cardiology

## 2019-05-02 DIAGNOSIS — R2681 Unsteadiness on feet: Secondary | ICD-10-CM | POA: Diagnosis not present

## 2019-05-02 DIAGNOSIS — R260 Ataxic gait: Secondary | ICD-10-CM | POA: Diagnosis not present

## 2019-05-02 DIAGNOSIS — I442 Atrioventricular block, complete: Secondary | ICD-10-CM

## 2019-05-02 DIAGNOSIS — R41841 Cognitive communication deficit: Secondary | ICD-10-CM | POA: Diagnosis not present

## 2019-05-02 DIAGNOSIS — I4821 Permanent atrial fibrillation: Secondary | ICD-10-CM

## 2019-05-02 DIAGNOSIS — R2689 Other abnormalities of gait and mobility: Secondary | ICD-10-CM | POA: Diagnosis not present

## 2019-05-02 DIAGNOSIS — Z950 Presence of cardiac pacemaker: Secondary | ICD-10-CM

## 2019-05-02 DIAGNOSIS — M6281 Muscle weakness (generalized): Secondary | ICD-10-CM | POA: Diagnosis not present

## 2019-05-02 DIAGNOSIS — R278 Other lack of coordination: Secondary | ICD-10-CM | POA: Diagnosis not present

## 2019-05-03 DIAGNOSIS — R278 Other lack of coordination: Secondary | ICD-10-CM | POA: Diagnosis not present

## 2019-05-03 DIAGNOSIS — R260 Ataxic gait: Secondary | ICD-10-CM | POA: Diagnosis not present

## 2019-05-03 DIAGNOSIS — R2681 Unsteadiness on feet: Secondary | ICD-10-CM | POA: Diagnosis not present

## 2019-05-03 DIAGNOSIS — R2689 Other abnormalities of gait and mobility: Secondary | ICD-10-CM | POA: Diagnosis not present

## 2019-05-03 DIAGNOSIS — M6281 Muscle weakness (generalized): Secondary | ICD-10-CM | POA: Diagnosis not present

## 2019-05-03 DIAGNOSIS — R41841 Cognitive communication deficit: Secondary | ICD-10-CM | POA: Diagnosis not present

## 2019-05-06 DIAGNOSIS — R260 Ataxic gait: Secondary | ICD-10-CM | POA: Diagnosis not present

## 2019-05-06 DIAGNOSIS — R2681 Unsteadiness on feet: Secondary | ICD-10-CM | POA: Diagnosis not present

## 2019-05-06 DIAGNOSIS — R2689 Other abnormalities of gait and mobility: Secondary | ICD-10-CM | POA: Diagnosis not present

## 2019-05-06 DIAGNOSIS — M6281 Muscle weakness (generalized): Secondary | ICD-10-CM | POA: Diagnosis not present

## 2019-05-06 DIAGNOSIS — R41841 Cognitive communication deficit: Secondary | ICD-10-CM | POA: Diagnosis not present

## 2019-05-06 DIAGNOSIS — R278 Other lack of coordination: Secondary | ICD-10-CM | POA: Diagnosis not present

## 2019-05-07 DIAGNOSIS — R278 Other lack of coordination: Secondary | ICD-10-CM | POA: Diagnosis not present

## 2019-05-07 DIAGNOSIS — R2681 Unsteadiness on feet: Secondary | ICD-10-CM | POA: Diagnosis not present

## 2019-05-07 DIAGNOSIS — R260 Ataxic gait: Secondary | ICD-10-CM | POA: Diagnosis not present

## 2019-05-07 DIAGNOSIS — R2689 Other abnormalities of gait and mobility: Secondary | ICD-10-CM | POA: Diagnosis not present

## 2019-05-07 DIAGNOSIS — R41841 Cognitive communication deficit: Secondary | ICD-10-CM | POA: Diagnosis not present

## 2019-05-07 DIAGNOSIS — M6281 Muscle weakness (generalized): Secondary | ICD-10-CM | POA: Diagnosis not present

## 2019-05-08 DIAGNOSIS — R2689 Other abnormalities of gait and mobility: Secondary | ICD-10-CM | POA: Diagnosis not present

## 2019-05-08 DIAGNOSIS — R278 Other lack of coordination: Secondary | ICD-10-CM | POA: Diagnosis not present

## 2019-05-08 DIAGNOSIS — M6281 Muscle weakness (generalized): Secondary | ICD-10-CM | POA: Diagnosis not present

## 2019-05-08 DIAGNOSIS — R41841 Cognitive communication deficit: Secondary | ICD-10-CM | POA: Diagnosis not present

## 2019-05-08 DIAGNOSIS — R2681 Unsteadiness on feet: Secondary | ICD-10-CM | POA: Diagnosis not present

## 2019-05-08 DIAGNOSIS — R260 Ataxic gait: Secondary | ICD-10-CM | POA: Diagnosis not present

## 2019-05-09 DIAGNOSIS — R278 Other lack of coordination: Secondary | ICD-10-CM | POA: Diagnosis not present

## 2019-05-09 DIAGNOSIS — R2689 Other abnormalities of gait and mobility: Secondary | ICD-10-CM | POA: Diagnosis not present

## 2019-05-09 DIAGNOSIS — M6281 Muscle weakness (generalized): Secondary | ICD-10-CM | POA: Diagnosis not present

## 2019-05-09 DIAGNOSIS — R41841 Cognitive communication deficit: Secondary | ICD-10-CM | POA: Diagnosis not present

## 2019-05-09 DIAGNOSIS — R260 Ataxic gait: Secondary | ICD-10-CM | POA: Diagnosis not present

## 2019-05-09 DIAGNOSIS — R2681 Unsteadiness on feet: Secondary | ICD-10-CM | POA: Diagnosis not present

## 2019-05-10 DIAGNOSIS — M6281 Muscle weakness (generalized): Secondary | ICD-10-CM | POA: Diagnosis not present

## 2019-05-10 DIAGNOSIS — R278 Other lack of coordination: Secondary | ICD-10-CM | POA: Diagnosis not present

## 2019-05-10 DIAGNOSIS — R2681 Unsteadiness on feet: Secondary | ICD-10-CM | POA: Diagnosis not present

## 2019-05-10 DIAGNOSIS — R2689 Other abnormalities of gait and mobility: Secondary | ICD-10-CM | POA: Diagnosis not present

## 2019-05-10 DIAGNOSIS — R41841 Cognitive communication deficit: Secondary | ICD-10-CM | POA: Diagnosis not present

## 2019-05-10 DIAGNOSIS — R260 Ataxic gait: Secondary | ICD-10-CM | POA: Diagnosis not present

## 2019-05-13 DIAGNOSIS — R260 Ataxic gait: Secondary | ICD-10-CM | POA: Diagnosis not present

## 2019-05-13 DIAGNOSIS — R41841 Cognitive communication deficit: Secondary | ICD-10-CM | POA: Diagnosis not present

## 2019-05-13 DIAGNOSIS — R278 Other lack of coordination: Secondary | ICD-10-CM | POA: Diagnosis not present

## 2019-05-13 DIAGNOSIS — M6281 Muscle weakness (generalized): Secondary | ICD-10-CM | POA: Diagnosis not present

## 2019-05-13 DIAGNOSIS — R2681 Unsteadiness on feet: Secondary | ICD-10-CM | POA: Diagnosis not present

## 2019-05-13 DIAGNOSIS — R2689 Other abnormalities of gait and mobility: Secondary | ICD-10-CM | POA: Diagnosis not present

## 2019-05-14 DIAGNOSIS — R41841 Cognitive communication deficit: Secondary | ICD-10-CM | POA: Diagnosis not present

## 2019-05-14 DIAGNOSIS — R2681 Unsteadiness on feet: Secondary | ICD-10-CM | POA: Diagnosis not present

## 2019-05-14 DIAGNOSIS — R2689 Other abnormalities of gait and mobility: Secondary | ICD-10-CM | POA: Diagnosis not present

## 2019-05-14 DIAGNOSIS — R260 Ataxic gait: Secondary | ICD-10-CM | POA: Diagnosis not present

## 2019-05-14 DIAGNOSIS — R278 Other lack of coordination: Secondary | ICD-10-CM | POA: Diagnosis not present

## 2019-05-14 DIAGNOSIS — M6281 Muscle weakness (generalized): Secondary | ICD-10-CM | POA: Diagnosis not present

## 2019-05-15 DIAGNOSIS — R2689 Other abnormalities of gait and mobility: Secondary | ICD-10-CM | POA: Diagnosis not present

## 2019-05-15 DIAGNOSIS — M6281 Muscle weakness (generalized): Secondary | ICD-10-CM | POA: Diagnosis not present

## 2019-05-15 DIAGNOSIS — R278 Other lack of coordination: Secondary | ICD-10-CM | POA: Diagnosis not present

## 2019-05-15 DIAGNOSIS — R41841 Cognitive communication deficit: Secondary | ICD-10-CM | POA: Diagnosis not present

## 2019-05-15 DIAGNOSIS — R2681 Unsteadiness on feet: Secondary | ICD-10-CM | POA: Diagnosis not present

## 2019-05-15 DIAGNOSIS — R260 Ataxic gait: Secondary | ICD-10-CM | POA: Diagnosis not present

## 2019-05-16 DIAGNOSIS — R41841 Cognitive communication deficit: Secondary | ICD-10-CM | POA: Diagnosis not present

## 2019-05-16 DIAGNOSIS — R2689 Other abnormalities of gait and mobility: Secondary | ICD-10-CM | POA: Diagnosis not present

## 2019-05-16 DIAGNOSIS — R278 Other lack of coordination: Secondary | ICD-10-CM | POA: Diagnosis not present

## 2019-05-16 DIAGNOSIS — R260 Ataxic gait: Secondary | ICD-10-CM | POA: Diagnosis not present

## 2019-05-16 DIAGNOSIS — R2681 Unsteadiness on feet: Secondary | ICD-10-CM | POA: Diagnosis not present

## 2019-05-16 DIAGNOSIS — M6281 Muscle weakness (generalized): Secondary | ICD-10-CM | POA: Diagnosis not present

## 2019-05-17 DIAGNOSIS — R278 Other lack of coordination: Secondary | ICD-10-CM | POA: Diagnosis not present

## 2019-05-17 DIAGNOSIS — R260 Ataxic gait: Secondary | ICD-10-CM | POA: Diagnosis not present

## 2019-05-17 DIAGNOSIS — M6281 Muscle weakness (generalized): Secondary | ICD-10-CM | POA: Diagnosis not present

## 2019-05-17 DIAGNOSIS — R2681 Unsteadiness on feet: Secondary | ICD-10-CM | POA: Diagnosis not present

## 2019-05-17 DIAGNOSIS — R2689 Other abnormalities of gait and mobility: Secondary | ICD-10-CM | POA: Diagnosis not present

## 2019-05-17 DIAGNOSIS — R41841 Cognitive communication deficit: Secondary | ICD-10-CM | POA: Diagnosis not present

## 2019-05-20 DIAGNOSIS — R260 Ataxic gait: Secondary | ICD-10-CM | POA: Diagnosis not present

## 2019-05-20 DIAGNOSIS — R2681 Unsteadiness on feet: Secondary | ICD-10-CM | POA: Diagnosis not present

## 2019-05-20 DIAGNOSIS — M6281 Muscle weakness (generalized): Secondary | ICD-10-CM | POA: Diagnosis not present

## 2019-05-20 DIAGNOSIS — R2689 Other abnormalities of gait and mobility: Secondary | ICD-10-CM | POA: Diagnosis not present

## 2019-05-20 DIAGNOSIS — R278 Other lack of coordination: Secondary | ICD-10-CM | POA: Diagnosis not present

## 2019-05-20 DIAGNOSIS — R41841 Cognitive communication deficit: Secondary | ICD-10-CM | POA: Diagnosis not present

## 2019-05-21 DIAGNOSIS — R41841 Cognitive communication deficit: Secondary | ICD-10-CM | POA: Diagnosis not present

## 2019-05-21 DIAGNOSIS — R2689 Other abnormalities of gait and mobility: Secondary | ICD-10-CM | POA: Diagnosis not present

## 2019-05-21 DIAGNOSIS — M6281 Muscle weakness (generalized): Secondary | ICD-10-CM | POA: Diagnosis not present

## 2019-05-21 DIAGNOSIS — R2681 Unsteadiness on feet: Secondary | ICD-10-CM | POA: Diagnosis not present

## 2019-05-21 DIAGNOSIS — R278 Other lack of coordination: Secondary | ICD-10-CM | POA: Diagnosis not present

## 2019-05-21 DIAGNOSIS — R260 Ataxic gait: Secondary | ICD-10-CM | POA: Diagnosis not present

## 2019-05-22 DIAGNOSIS — R260 Ataxic gait: Secondary | ICD-10-CM | POA: Diagnosis not present

## 2019-05-22 DIAGNOSIS — R2681 Unsteadiness on feet: Secondary | ICD-10-CM | POA: Diagnosis not present

## 2019-05-22 DIAGNOSIS — R2689 Other abnormalities of gait and mobility: Secondary | ICD-10-CM | POA: Diagnosis not present

## 2019-05-22 DIAGNOSIS — R41841 Cognitive communication deficit: Secondary | ICD-10-CM | POA: Diagnosis not present

## 2019-05-22 DIAGNOSIS — R278 Other lack of coordination: Secondary | ICD-10-CM | POA: Diagnosis not present

## 2019-05-22 DIAGNOSIS — M6281 Muscle weakness (generalized): Secondary | ICD-10-CM | POA: Diagnosis not present

## 2019-05-23 ENCOUNTER — Encounter: Payer: Self-pay | Admitting: Adult Health

## 2019-05-23 ENCOUNTER — Ambulatory Visit (INDEPENDENT_AMBULATORY_CARE_PROVIDER_SITE_OTHER): Payer: Medicare Other | Admitting: Adult Health

## 2019-05-23 ENCOUNTER — Other Ambulatory Visit: Payer: Self-pay

## 2019-05-23 VITALS — BP 123/71 | HR 61 | Temp 97.3°F | Ht 66.0 in | Wt 141.0 lb

## 2019-05-23 DIAGNOSIS — E785 Hyperlipidemia, unspecified: Secondary | ICD-10-CM

## 2019-05-23 DIAGNOSIS — I480 Paroxysmal atrial fibrillation: Secondary | ICD-10-CM | POA: Diagnosis not present

## 2019-05-23 DIAGNOSIS — F0390 Unspecified dementia without behavioral disturbance: Secondary | ICD-10-CM

## 2019-05-23 DIAGNOSIS — I10 Essential (primary) hypertension: Secondary | ICD-10-CM | POA: Diagnosis not present

## 2019-05-23 DIAGNOSIS — I63521 Cerebral infarction due to unspecified occlusion or stenosis of right anterior cerebral artery: Secondary | ICD-10-CM

## 2019-05-23 NOTE — Progress Notes (Signed)
[facility] [address] [place]. [place] [address] [phone]       HOSPITAL FOLLOW UP NOTE  [name] Date of Birth:  1921/10/16 Medical Record Number:  [id number]   Reason for Referral:  hospital stroke follow up    CHIEF COMPLAINT:  Chief Complaint  Patient presents with   Hospitalization Follow-up    Rm treatment rm.  Wells Guiles- daughter in law   Cerebrovascular Accident    2wk of [facility]/ST at [facility].  Since stroke in 3/20 had fall, and pacemaker     HPI: [name] being seen today for in office hospital follow-up regarding left frontal/ACA territory infarcts likely due to A. fib not on AC on 12/03/2018.  History obtained from patient, daughter-in-law and chart review. Reviewed all radiology images and labs personally.  [name] a 85 y.o.femalewith history of a permanent pacemaker for complete AV block, previous strokes, cerebral aneurysm clipping in 1993, hypothyroidism, hypertension, hyperlipidemia, dementia, glucose intolerance, coronary artery disease, and congestive heart failure  who presented on 12/03/2018 with right-sided weakness and aphasia. Neurology consulted with stroke work-up revealing left frontal/ACA territory infarct likely due to A. fib not on AC with residual right arm paresis and paucity of speech.  Initial CT head showed nonhemorrhagic left frontal/ACA territory infarct and multiple old infarcts.  Due to residual deficits, received IV TPA.  CTA head/neck showed emergent left A2 occlusion, severe left and moderate right supraclinoid ICA stenosis, occluded left MCA aneurysm clip with intermediate collaterals and right ICA bulb atherosclerosis.  Repeat CT head showed evolving acute left frontal/ACA territory infarct with new hemorrhagic conversion.  Unable to undergo MRI due to pacer.  2D echo EF 55%.  Prior finding of atrial fibrillation via pacer but was previously deemed not AC candidate by  cardiology due to history of cerebral aneurysm but since aneurysm has been secured with clipping, possible candidate for [facility] due to history of multiple strokes.  Is recommended to follow-up with CT scan in 1 week's time and Dr. Leonie Man decide on possible use of DO [facility] with cardiology agreement.  She was started on aspirin 81 mg daily for secondary stroke prevention.  HTN stable.  LDL 48 and recommended continuation of pravastatin 40 mg daily. Underlying history of dementia stable on Aricept.  Other stroke risk factors include advanced age, CAD and CHF.  She was discharged to [facility] for ongoing therapy for residual deficits.  During inpatient rehab, repeat CT head stable and was recommended to initiate Eliquis for atrial fibrillation and secondary stroke prevention.  She was discharged to SNF on 12/26/2018.  She is being seen today for hospital stroke follow-up visit and accompanied by her daughter-in-law.  She continues to reside at [facility] independent living.  Residual deficits of mild right hemiparesis and slightly worsened expressive aphasia from baseline but improving since discharge.  Underlying history of dementia with worsening after stroke.  Currently receiving therapy through [facility] and will receive for additional 2 weeks.  Continues to use rolling walker for assistance with difficulty in pivoting/turning.  She continues to receive assistance through privately paid resource with assistance of ADLs and safety.  She continues on Aricept 10 mg daily.  Discussion regarding possible underlying depression which patient denies but per daughter-in-law, she does make comments frequently about family being unable to visit due to COVID-19 restrictions and lack of activities at facility.  She continues on Eliquis without bleeding or bruising.  Continues on pravastatin without myalgias.  Blood pressure  today satisfactory at 123/71.  She continues to follow with cardiologist Dr. Lovena Le with recently undergoing PPM  generator change out and per daughter-in-law, new pacer is not compatible with MRI.  No further concerns at this time.    ROS:   14 system review of systems performed and negative with exception of see HPI  PMH:  Past Medical History:  Diagnosis Date   Aphasia 01/09/2009   Arthritis    right hand   AV BLOCK, COMPLETE 01/09/2009   CEREBROVASCULAR ACCIDENT, HX OF 11/20/2007   CHF (congestive heart failure) (HCC)    Coronary artery disease    CVA 01/09/2009   DEMENTIA 05/19/2008   Dementia ([place])    DIVERTICULOSIS, COLON 11/20/2007   FATIGUE 11/20/2007   Femoral fracture ([place]) 07/01/2013   GLUCOSE INTOLERANCE 12/06/2010   HYPERLIPIDEMIA 11/20/2007   HYPERTENSION 11/20/2007   Echo was done 07/07/11: mod LVH, EF 55-60%, grade 1 diast dysfxn, mild MR, mild LAE, mod TR, PASP 39   HYPOTHYROIDISM 11/20/2007   Impaired glucose tolerance 06/03/2011   IRON DEFICIENCY 12/06/2010   Pacemaker 2002   PACEMAKER, PERMANENT 01/09/2009   TIA 05/14/2003   elevated CE's in setting of TIA and falls in 10/12 - echo normal with no further cardiac w/u pursued   TIA (transient ischemic attack) 10/10/2011   Urinary incontinence 01/09/2009    PSH:  Past Surgical History:  Procedure Laterality Date   ABDOMINAL HYSTERECTOMY     CHOLECYSTECTOMY     CYSTOCELE REPAIR N/A 11/26/2012   Procedure: Cystocele Repair with Lefort Colpocleisis.  colpectomy Levatorplasty Perineorraphy;  Surgeon: Lovenia Kim, MD;  Location: [facility];  Service: Gynecology;  Laterality: N/A;  Cystocele Repair with Lefort Colpocleisis.   HIP ARTHROPLASTY Left 07/02/2013   Procedure: ARTHROPLASTY BIPOLAR HIP;  Surgeon: Mauri Pole, MD;  Location: [facility];  Service: Orthopedics;  Laterality: Left;   INGUINAL HERNIA REPAIR Right 08/13/2013   Procedure: HERNIA REPAIR INGUINAL INCARCERATED;  Surgeon: Ralene Ok, MD;  Location: [facility];  Service: General;  Laterality: Right;   Medtronic Cynthia dual chamber pacemaker  serial number was [id number] H...04/15/2009     PPM GENERATOR CHANGEOUT N/A 04/15/2019   Procedure: PPM GENERATOR CHANGEOUT;  Surgeon: Evans Lance, MD;  Location: [facility];  Service: Cardiovascular;  Laterality: N/A;   s/p cerebral aneurysm  1991   s/p pacemaker DDD     s/p retinal attachment     s/p thyroidectomy      Social History:  Social History   Socioeconomic History   Marital status: Divorced    Spouse name: Not on file   Number of children: Not on file   Years of education: Not on file   Highest education level: Not on file  Occupational History   Occupation: retired Radiation protection practitioner, lives at [employer] resource strain: Not on file   Food insecurity    Worry: Not on file    Inability: Not on file   Transportation needs    Medical: Not on file    Non-medical: Not on file  Tobacco Use   Smoking status: Never Smoker   Smokeless tobacco: Never Used  Substance and Sexual Activity   Alcohol use: No   Drug use: No   Sexual activity: Not on file  Lifestyle   Physical activity    Days per week: Not on file    Minutes per session: Not on file   Stress: Not on file  Relationships   Social  connections    Talks on phone: Not on file    Gets together: Not on file    Attends religious service: Not on file    Active member of club or organization: Not on file    Attends meetings of clubs or organizations: Not on file    Relationship status: Not on file   Intimate partner violence    Fear of current or ex partner: Not on file    Emotionally abused: Not on file    Physically abused: Not on file    Forced sexual activity: Not on file  Other Topics Concern   Not on file  Social History Narrative   Not on file    Family History:  Family History  Problem Relation Age of Onset   Cancer Mother        breast cancer    Medications:   Current Outpatient Medications on File Prior to Visit  Medication Sig Dispense  Refill   ALPRAZolam (XANAX) 0.25 MG tablet Take 0.25 mg by mouth daily as needed for anxiety.     amLODipine (NORVASC) 10 MG tablet TAKE 1 TABLET BY MOUTH DAILY. RESUME IN 3 DAYS IS SYSTOLIC BLOOD PRESSURE IS GREATER THAN 150. (Patient taking differently: Take 10 mg by mouth daily. ) 90 tablet 1   apixaban (ELIQUIS) 5 MG TABS tablet Take 1 tablet (5 mg total) by mouth 2 (two) times daily. 60 tablet 2   Calcium Carbonate-Vitamin D (CALTRATE 600+D) 600-400 MG-UNIT per tablet Take 1 tablet by mouth at bedtime.      docusate sodium (COLACE) 100 MG capsule Take 100 mg by mouth daily.     donepezil (ARICEPT) 10 MG tablet TAKE 1 TABLET(10 MG) BY MOUTH DAILY 90 tablet 1   levothyroxine (SYNTHROID) 125 MCG tablet TAKE 1 TABLET(125 MCG) BY MOUTH DAILY (Patient taking differently: Take 125 mcg by mouth daily before breakfast. ) 90 tablet 1   Multiple Vitamins-Minerals (CENTRUM SILVER 50+WOMEN) TABS Take 1 tablet by mouth daily.     pantoprazole (PROTONIX) 40 MG tablet Take 1 tablet (40 mg total) by mouth daily. 90 tablet 1   polyvinyl alcohol (LIQUIFILM TEARS) 1.4 % ophthalmic solution Place 1 drop into both eyes 3 (three) times daily.     pravastatin (PRAVACHOL) 10 MG tablet Take 1 tablet (10 mg total) by mouth at bedtime. 90 tablet 1   valsartan (DIOVAN) 80 MG tablet Take 1 tablet (80 mg total) by mouth daily. Take 80 mg by mouth daily. (Patient taking differently: Take 80 mg by mouth daily. ) 90 tablet 1   No current facility-administered medications on file prior to visit.     Allergies:   Allergies  Allergen Reactions   Codeine Rash     Physical Exam  Vitals:   05/23/19 1400  BP: 123/71  Pulse: 61  Temp: (!) 97.3 F (36.3 C)  Weight: 141 lb (64 kg)  Height: 5\' 6"  (1.676 m)   Body mass index is 22.76 kg/m. No exam data present  Depression screen [facility] 2/9 05/25/2019  Decreased Interest 0  Down, Depressed, Hopeless 1  PHQ - 2 Score 1  Some recent data might be hidden       General: Frail pleasant elderly Caucasian female, seated, in no evident distress Head: head normocephalic and atraumatic.   Neck: supple with no carotid or supraclavicular bruits Cardiovascular: regular rate and rhythm, no murmurs; +2 pitting edema BLE Musculoskeletal: no deformity Skin:  no rash/petichiae Vascular:  Normal pulses all extremities  Neurologic Exam Mental Status: Awake and fully alert.   Mild expressive aphasia.  Oriented to place and time. Recent and remote memory intact. Attention span, concentration and fund of knowledge appropriate. Mood and affect appropriate.  Cranial Nerves: Fundoscopic exam reveals sharp disc margins. Pupils equal, briskly reactive to light. Extraocular movements full without nystagmus. Visual fields full to confrontation. Hearing intact. Facial sensation intact.  Mild right lower facial weakness. Motor: Normal bulk and tone.   RUE: 4+/5 RLE: 4/5 hip flexor and ankle dorsiflexion LUE: 5/5 LLE: 4+/5 hip flexor Sensory.: intact to touch , pinprick , position and vibratory sensation.  Coordination: Rapid alternating movements normal in all extremities except mildly diminished right hand. Finger-to-nose and heel-to-shin performed accurately bilaterally with slight difficulty heel-to-shin due to hip flexor weakness. Gait and Station: Currently in wheelchair without rolling walker present at visit.  Gait assessment deferred. Reflexes: 1+ and symmetric. Toes downgoing.     NIHSS  2 Modified Rankin  3 CHA2DS2-VASc 7 HAS-BLED 2   Diagnostic Data (Labs, Imaging, Testing)  Ct Angio Head W Or Wo Contrast Ct Angio Neck W Or Wo Contrast Ct Cerebral Perfusion W Contrast 12/03/2018 IMPRESSION:   CTA NECK: 1. No hemodynamically significant stenosis ICA's.  2. Patent vertebral arteries.  3.An incidental finding of potential clinical significance has been found. 3.9 cm ascending aorta.Recommend annual imaging followup by CTA or MRA. This  recommendation follows 2010 ACCF/AHA/AATS/ACR/ASA/SCA/SCAI/SIR/STS/SVM Guidelines for the Diagnosis and Management of Patients with Thoracic Aortic Disease. Circulation.2010; [id number]. Aortic aneurysm NOS (ICD10-I71.9)**   CTA HEAD:  1. Emergent LEFT A2 occlusion,poor collateralization by single-phase CTA.  2. Severe LEFT and moderate RIGHT supraclinoid ICA stenosis.  3. Occluded LEFT MCA at aneurysm clip, intermediate collaterals by single-phase   CTA. CT PERFUSION:  1. Rapid data processing failed.    Ct Head Code Stroke Wo Contrast 12/03/2018 IMPRESSION:  1. Acute nonhemorrhagic LEFT frontal/ACA territory infarct.  2. Old large LEFT MCA territory infarct, status post clipping of LEFT MCA aneurysm.  3. Moderate to severe chronic small vessel ischemic changes. Old small basal ganglia, thalami and cerebellar infarcts. 4. ASPECTS is 10.   PORTABLE CHEST 1 VIEW 12/05/2018 (COMPARISON: 12/04/2018) IMPRESSION: Improving infiltration or atelectasis in the left lung base.    Transthoracic Echocardiogram -   1. The left ventricle has a visually estimated ejection fraction of of 55%. The cavity size was normal. There is moderately increased left ventricular wall thickness. Left ventricular diastolic Doppler parameters are indeterminate. No evidence of left  ventricular regional wall motion abnormalities. Septal-lateral dyssynchrony consistent with RV pacing. 2. The right ventricle has normal systolic function. The cavity was normal. There is no increase in right ventricular wall thickness. 3. Left atrial size was severely dilated. 4. Right atrial size was mildly dilated. 5. The mitral valve is degenerative. Moderate calcification of the mitral valve leaflet. There is moderate to severe mitral annular calcification present. Mild mitral regurgitation. There is no more than mild mitral stenosis  present, mean gradient 4  mmHg. 6. Tricuspid valve regurgitation is moderate. 7. The aortic valve is tricuspid Moderate calcification of the aortic valve. Aortic valve regurgitation is moderate by color flow Doppler. no stenosis of the aortic valve. 8. The ascending aorta is normal in size and structure. 9. There is mild dilatation of the aortic root measuring 37 mm. 10. The inferior vena cava was dilated in size with <50% respiratory variability. PA systolic pressure 45 mmHg. 11. Trivial pericardial effusion is present.   ASSESSMENT: [name] Argenbright  [name] is a 85 y.o. year old female presented with right-sided weakness and aphasia receiving IV TPA on 12/03/2018 with stroke work-up revealing left frontal/ACA territory infarct likely secondary to A. fib not on AC. Vascular risk factors include history of stroke, A. fib not on AC, HTN, HLD, dementia, CAD and CHF.  Has been recovering well from a stroke standpoint with residual mild dysarthria and right lower facial droop.    PLAN:  1. Left frontal/ACA territory infarct: Continue Eliquis (apixaban) daily  and pravastatin for secondary stroke prevention. Maintain strict control of hypertension with blood pressure goal below 130/90, diabetes with hemoglobin A1c goal below 6.5% and cholesterol with LDL cholesterol (bad cholesterol) goal below 70 mg/dL.  I also advised the patient to eat a healthy diet with plenty of whole grains, cereals, fruits and vegetables, exercise regularly with at least 30 minutes of continuous activity daily and maintain ideal body weight.  Advised to continue to participate in home health PT/OT/ST. 2. HTN: Advised to continue current treatment regimen.  Today's BP satisfactory.  Advised to continue to monitor at home along with continued follow-up with PCP for management 3. HLD: Advised to continue current treatment regimen along with  continued follow-up with PCP for future prescribing and monitoring of lipid panel 4. Atrial fibrillation: Continue to follow with cardiologist Dr. Lovena Le for ongoing monitoring and management.  Also advised to continue to follow with Dr. Lovena Le for lower extremity edema 5. Dementia/depression: Ongoing use of Aricept and consider initiating Lexapro 5 mg daily for possible post stroke depression.  Daughter-in-law would like to hold off at this time and speak with her husband with additional information provided and will call office if interested in initiating    Follow up in 3 months or call earlier if needed   Greater than 50% of time during this 45 minute visit was spent on counseling, explanation of diagnosis of left frontal/ACA territory infarct, reviewing risk factor management of HTN, HLD and atrial fibrillation along with discussion of worsening dementia and potential underlying depression, planning of further management along with potential future management, and discussion with patient and family answering all questions.    Frann Rider, AGNP-BC  [facility] [address] [place], [place] [address]  Phone [phone] Fax [phone] Note: This document was prepared with digital dictation and possible smart phrase technology. Any transcriptional errors that result from this process are unintentional.

## 2019-05-23 NOTE — Patient Instructions (Signed)
Continue to work with therapies in hopes of ongoing improvements  Continue Eliquis (apixaban) daily  and pravastatin for secondary stroke prevention  Continue to follow with Dr. Lovena Le for atrial fibrillation management along with ongoing leg swelling for possible need of management  Continue Aricept for underlying dementia and consider initiating antidepressant as poststroke depression is very common and high likelihood of situational depression with limitation of visiting family and having interaction.  It would be recommended to initiate Lexapro 5 mg daily as this medication has been found to work well with elderly and depression management.  Please call office if interested in initiating  Continue to follow up with PCP regarding cholesterol and blood pressure management   Continue to monitor blood pressure at home  Maintain strict control of hypertension with blood pressure goal below 130/90, diabetes with hemoglobin A1c goal below 6.5% and cholesterol with LDL cholesterol (bad cholesterol) goal below 70 mg/dL. I also advised the patient to eat a healthy diet with plenty of whole grains, cereals, fruits and vegetables, exercise regularly and maintain ideal body weight.  Followup in the future with me in 3 months or call earlier if needed       Thank you for coming to see [name] at [facility]. I hope we have been able to provide you high quality care today.  You may receive a patient satisfaction survey over the next few weeks. We would appreciate your feedback and comments so that we may continue to improve ourselves and the health of our patients.    Escitalopram tablets What is this medicine? ESCITALOPRAM (es sye TAL oh pram) is used to treat depression and certain types of anxiety. This medicine may be used for other purposes; ask your health care provider or pharmacist if you have questions. COMMON BRAND NAME(S): Lexapro What should I tell my health care  provider before I take this medicine? They need to know if you have any of these conditions:  bipolar disorder or a family history of bipolar disorder  diabetes  glaucoma  heart disease  kidney or liver disease  receiving electroconvulsive therapy  seizures (convulsions)  suicidal thoughts, plans, or attempt by you or a family member  an unusual or allergic reaction to escitalopram, the related drug citalopram, other medicines, foods, dyes, or preservatives  pregnant or trying to become pregnant  breast-feeding How should I use this medicine? Take this medicine by mouth with a glass of water. Follow the directions on the prescription label. You can take it with or without food. If it upsets your stomach, take it with food. Take your medicine at regular intervals. Do not take it more often than directed. Do not stop taking this medicine suddenly except upon the advice of your doctor. Stopping this medicine too quickly may cause serious side effects or your condition may worsen. A special MedGuide will be given to you by the pharmacist with each prescription and refill. Be sure to read this information carefully each time. Talk to your pediatrician regarding the use of this medicine in children. Special care may be needed. Overdosage: If you think you have taken too much of this medicine contact a poison control center or emergency room at once. NOTE: This medicine is only for you. Do not share this medicine with others. What if I miss a dose? If you miss a dose, take it as soon as you can. If it is almost time for your next dose, take only that dose. Do not take double  or extra doses. What may interact with this medicine? Do not take this medicine with any of the following medications:  certain medicines for fungal infections like fluconazole, itraconazole, ketoconazole, posaconazole, voriconazole  cisapride  citalopram  dronedarone  linezolid  MAOIs like Carbex, Eldepryl,  Marplan, Nardil, and Parnate  methylene blue (injected into a vein)  pimozide  thioridazine This medicine may also interact with the following medications:  alcohol  amphetamines  aspirin and aspirin-like medicines  carbamazepine  certain medicines for depression, anxiety, or psychotic disturbances  certain medicines for migraine headache like almotriptan, eletriptan, frovatriptan, naratriptan, rizatriptan, sumatriptan, zolmitriptan  certain medicines for sleep  certain medicines that treat or prevent blood clots like warfarin, enoxaparin, dalteparin  cimetidine  diuretics  dofetilide  fentanyl  furazolidone  isoniazid  lithium  metoprolol  NSAIDs, medicines for pain and inflammation, like ibuprofen or naproxen  other medicines that prolong the QT interval (cause an abnormal heart rhythm)  procarbazine  rasagiline  supplements like St. John's wort, kava kava, valerian  tramadol  tryptophan  ziprasidone This list may not describe all possible interactions. Give your health care provider a list of all the medicines, herbs, non-prescription drugs, or dietary supplements you use. Also tell them if you smoke, drink alcohol, or use illegal drugs. Some items may interact with your medicine. What should I watch for while using this medicine? Tell your doctor if your symptoms do not get better or if they get worse. Visit your doctor or health care professional for regular checks on your progress. Because it may take several weeks to see the full effects of this medicine, it is important to continue your treatment as prescribed by your doctor. Patients and their families should watch out for new or worsening thoughts of suicide or depression. Also watch out for sudden changes in feelings such as feeling anxious, agitated, panicky, irritable, hostile, aggressive, impulsive, severely restless, overly excited and hyperactive, or not being able to sleep. If this happens,  especially at the beginning of treatment or after a change in dose, call your health care professional. Dennis Bast may get drowsy or dizzy. Do not drive, use machinery, or do anything that needs mental alertness until you know how this medicine affects you. Do not stand or sit up quickly, especially if you are an older patient. This reduces the risk of dizzy or fainting spells. Alcohol may interfere with the effect of this medicine. Avoid alcoholic drinks. Your mouth may get dry. Chewing sugarless gum or sucking hard candy, and drinking plenty of water may help. Contact your doctor if the problem does not go away or is severe. What side effects may I notice from receiving this medicine? Side effects that you should report to your doctor or health care professional as soon as possible:  allergic reactions like skin rash, itching or hives, swelling of the face, lips, or tongue  anxious  black, tarry stools  changes in vision  confusion  elevated mood, decreased need for sleep, racing thoughts, impulsive behavior  eye pain  fast, irregular heartbeat  feeling faint or lightheaded, falls  feeling agitated, angry, or irritable  hallucination, loss of contact with reality  loss of balance or coordination  loss of memory  painful or prolonged erections  restlessness, pacing, inability to keep still  seizures  stiff muscles  suicidal thoughts or other mood changes  trouble sleeping  unusual bleeding or bruising  unusually weak or tired  vomiting Side effects that usually do not require medical attention (  report to your doctor or health care professional if they continue or are bothersome):  changes in appetite  change in sex drive or performance  headache  increased sweating  indigestion, nausea  tremors This list may not describe all possible side effects. Call your doctor for medical advice about side effects. You may report side effects to FDA at  1-800-FDA-1088. Where should I keep my medicine? Keep out of reach of children. Store at room temperature between 15 and 30 degrees C (59 and 86 degrees F). Throw away any unused medicine after the expiration date. NOTE: This sheet is a summary. It may not cover all possible information. If you have questions about this medicine, talk to your doctor, pharmacist, or health care provider.  2020 Elsevier/Gold Standard (2018-09-03 11:21:44)

## 2019-05-24 DIAGNOSIS — R278 Other lack of coordination: Secondary | ICD-10-CM | POA: Diagnosis not present

## 2019-05-24 DIAGNOSIS — M6281 Muscle weakness (generalized): Secondary | ICD-10-CM | POA: Diagnosis not present

## 2019-05-24 DIAGNOSIS — R2689 Other abnormalities of gait and mobility: Secondary | ICD-10-CM | POA: Diagnosis not present

## 2019-05-24 DIAGNOSIS — R2681 Unsteadiness on feet: Secondary | ICD-10-CM | POA: Diagnosis not present

## 2019-05-24 DIAGNOSIS — R41841 Cognitive communication deficit: Secondary | ICD-10-CM | POA: Diagnosis not present

## 2019-05-24 DIAGNOSIS — R260 Ataxic gait: Secondary | ICD-10-CM | POA: Diagnosis not present

## 2019-05-25 ENCOUNTER — Encounter: Payer: Self-pay | Admitting: Adult Health

## 2019-05-27 ENCOUNTER — Other Ambulatory Visit: Payer: Self-pay

## 2019-05-27 ENCOUNTER — Ambulatory Visit: Payer: Self-pay | Admitting: *Deleted

## 2019-05-27 DIAGNOSIS — R260 Ataxic gait: Secondary | ICD-10-CM | POA: Diagnosis not present

## 2019-05-27 DIAGNOSIS — R41841 Cognitive communication deficit: Secondary | ICD-10-CM | POA: Diagnosis not present

## 2019-05-27 DIAGNOSIS — M6281 Muscle weakness (generalized): Secondary | ICD-10-CM | POA: Diagnosis not present

## 2019-05-27 DIAGNOSIS — R2689 Other abnormalities of gait and mobility: Secondary | ICD-10-CM | POA: Diagnosis not present

## 2019-05-27 DIAGNOSIS — R278 Other lack of coordination: Secondary | ICD-10-CM | POA: Diagnosis not present

## 2019-05-27 DIAGNOSIS — R2681 Unsteadiness on feet: Secondary | ICD-10-CM | POA: Diagnosis not present

## 2019-05-27 MED ORDER — FUROSEMIDE 20 MG PO TABS: 20.0000 mg | ORAL_TABLET | Freq: Every day | ORAL | 3 refills | Status: DC

## 2019-05-27 MED ORDER — APIXABAN 5 MG PO TABS: 5.0000 mg | ORAL_TABLET | Freq: Two times a day (BID) | ORAL | 2 refills | Status: DC

## 2019-05-27 NOTE — Progress Notes (Signed)
I agree with the above plan 

## 2019-05-30 DIAGNOSIS — M6281 Muscle weakness (generalized): Secondary | ICD-10-CM | POA: Diagnosis not present

## 2019-05-30 DIAGNOSIS — R260 Ataxic gait: Secondary | ICD-10-CM | POA: Diagnosis not present

## 2019-05-30 DIAGNOSIS — R2681 Unsteadiness on feet: Secondary | ICD-10-CM | POA: Diagnosis not present

## 2019-05-30 DIAGNOSIS — R2689 Other abnormalities of gait and mobility: Secondary | ICD-10-CM | POA: Diagnosis not present

## 2019-06-05 DIAGNOSIS — R2689 Other abnormalities of gait and mobility: Secondary | ICD-10-CM | POA: Diagnosis not present

## 2019-06-05 DIAGNOSIS — R260 Ataxic gait: Secondary | ICD-10-CM | POA: Diagnosis not present

## 2019-06-05 DIAGNOSIS — M6281 Muscle weakness (generalized): Secondary | ICD-10-CM | POA: Diagnosis not present

## 2019-06-05 DIAGNOSIS — R2681 Unsteadiness on feet: Secondary | ICD-10-CM | POA: Diagnosis not present

## 2019-06-07 DIAGNOSIS — R260 Ataxic gait: Secondary | ICD-10-CM | POA: Diagnosis not present

## 2019-06-07 DIAGNOSIS — R2681 Unsteadiness on feet: Secondary | ICD-10-CM | POA: Diagnosis not present

## 2019-06-07 DIAGNOSIS — M6281 Muscle weakness (generalized): Secondary | ICD-10-CM | POA: Diagnosis not present

## 2019-06-07 DIAGNOSIS — R2689 Other abnormalities of gait and mobility: Secondary | ICD-10-CM | POA: Diagnosis not present

## 2019-06-11 DIAGNOSIS — R2689 Other abnormalities of gait and mobility: Secondary | ICD-10-CM | POA: Diagnosis not present

## 2019-06-11 DIAGNOSIS — R2681 Unsteadiness on feet: Secondary | ICD-10-CM | POA: Diagnosis not present

## 2019-06-11 DIAGNOSIS — M6281 Muscle weakness (generalized): Secondary | ICD-10-CM | POA: Diagnosis not present

## 2019-06-11 DIAGNOSIS — R260 Ataxic gait: Secondary | ICD-10-CM | POA: Diagnosis not present

## 2019-06-13 DIAGNOSIS — R260 Ataxic gait: Secondary | ICD-10-CM | POA: Diagnosis not present

## 2019-06-13 DIAGNOSIS — R2689 Other abnormalities of gait and mobility: Secondary | ICD-10-CM | POA: Diagnosis not present

## 2019-06-13 DIAGNOSIS — R2681 Unsteadiness on feet: Secondary | ICD-10-CM | POA: Diagnosis not present

## 2019-06-13 DIAGNOSIS — M6281 Muscle weakness (generalized): Secondary | ICD-10-CM | POA: Diagnosis not present

## 2019-06-19 DIAGNOSIS — Z119 Encounter for screening for infectious and parasitic diseases, unspecified: Secondary | ICD-10-CM | POA: Diagnosis not present

## 2019-06-25 ENCOUNTER — Encounter: Payer: Self-pay | Admitting: Internal Medicine

## 2019-06-25 ENCOUNTER — Ambulatory Visit (INDEPENDENT_AMBULATORY_CARE_PROVIDER_SITE_OTHER): Payer: Medicare Other | Admitting: Internal Medicine

## 2019-06-25 ENCOUNTER — Other Ambulatory Visit: Payer: Self-pay

## 2019-06-25 ENCOUNTER — Encounter (HOSPITAL_COMMUNITY): Payer: Self-pay | Admitting: Emergency Medicine

## 2019-06-25 ENCOUNTER — Emergency Department (HOSPITAL_COMMUNITY)
Admission: EM | Admit: 2019-06-25 | Discharge: 2019-06-25 | Disposition: A | Discharge status: Home / Self Care | Payer: Medicare Other | Attending: Emergency Medicine | Admitting: Emergency Medicine

## 2019-06-25 DIAGNOSIS — K625 Hemorrhage of anus and rectum: Secondary | ICD-10-CM | POA: Insufficient documentation

## 2019-06-25 DIAGNOSIS — K649 Unspecified hemorrhoids: Secondary | ICD-10-CM | POA: Diagnosis not present

## 2019-06-25 DIAGNOSIS — R7302 Impaired glucose tolerance (oral): Secondary | ICD-10-CM

## 2019-06-25 DIAGNOSIS — Z950 Presence of cardiac pacemaker: Secondary | ICD-10-CM | POA: Insufficient documentation

## 2019-06-25 DIAGNOSIS — Z79899 Other long term (current) drug therapy: Secondary | ICD-10-CM | POA: Diagnosis not present

## 2019-06-25 DIAGNOSIS — F0390 Unspecified dementia without behavioral disturbance: Secondary | ICD-10-CM | POA: Diagnosis not present

## 2019-06-25 DIAGNOSIS — Z96642 Presence of left artificial hip joint: Secondary | ICD-10-CM | POA: Insufficient documentation

## 2019-06-25 DIAGNOSIS — I509 Heart failure, unspecified: Secondary | ICD-10-CM | POA: Diagnosis not present

## 2019-06-25 DIAGNOSIS — D6489 Other specified anemias: Secondary | ICD-10-CM

## 2019-06-25 DIAGNOSIS — I63512 Cerebral infarction due to unspecified occlusion or stenosis of left middle cerebral artery: Secondary | ICD-10-CM | POA: Diagnosis not present

## 2019-06-25 DIAGNOSIS — I110 Hypertensive heart disease with heart failure: Secondary | ICD-10-CM | POA: Diagnosis not present

## 2019-06-25 DIAGNOSIS — Z7901 Long term (current) use of anticoagulants: Secondary | ICD-10-CM | POA: Diagnosis not present

## 2019-06-25 DIAGNOSIS — Z8673 Personal history of transient ischemic attack (TIA), and cerebral infarction without residual deficits: Secondary | ICD-10-CM | POA: Insufficient documentation

## 2019-06-25 DIAGNOSIS — R197 Diarrhea, unspecified: Secondary | ICD-10-CM | POA: Diagnosis not present

## 2019-06-25 DIAGNOSIS — E039 Hypothyroidism, unspecified: Secondary | ICD-10-CM | POA: Diagnosis not present

## 2019-06-25 LAB — POC OCCULT BLOOD, ED: Fecal Occult Bld: POSITIVE — AB

## 2019-06-25 LAB — CBC
HCT: 43.3 % (ref 36.0–46.0)
Hemoglobin: 14.5 g/dL (ref 12.0–15.0)
MCH: 31.2 pg (ref 26.0–34.0)
MCHC: 33.5 g/dL (ref 30.0–36.0)
MCV: 93.1 fL (ref 80.0–100.0)
Platelets: 315 10*3/uL (ref 150–400)
RBC: 4.65 MIL/uL (ref 3.87–5.11)
RDW: 13.1 % (ref 11.5–15.5)
WBC: 8.4 10*3/uL (ref 4.0–10.5)
nRBC: 0 % (ref 0.0–0.2)

## 2019-06-25 LAB — TYPE AND SCREEN
ABO/RH(D): A POS
Antibody Screen: NEGATIVE

## 2019-06-25 MED ORDER — HYDROCORTISONE (PERIANAL) 2.5 % EX CREA: 1.0000 "application " | TOPICAL_CREAM | Freq: Two times a day (BID) | CUTANEOUS | 0 refills | Status: AC

## 2019-06-25 NOTE — ED Notes (Signed)
Assisted EDP with rectal exam. Pt tolerated well, tender to the touch.

## 2019-06-25 NOTE — Patient Instructions (Signed)
Please go now to [place] 

## 2019-06-25 NOTE — Progress Notes (Signed)
Patient ID: [name], female   DOB: May 19, 1922, 85 y.o.   MRN: [id number]  Virtual Visit via Video Note  I connected with [name] on 06/25/19 at  3:40 PM EDT by a video enabled telemedicine application and verified that I am speaking with the correct person using two identifiers.  Location: Patient: at assisted living [facility] with daughter in law allowed with her Provider: at office   I discussed the limitations of evaluation and management by telemedicine and the availability of in person appointments. The patient expressed understanding and agreed to proceed.  History of Present Illness: Here with c/o sudden onset 2 days generalized abd pain and report by staff of several episodes of large amounts BRB mixed with stool, no melena, and denies fever, HA, CP, SOB, n/v, dizziness, AMS, falls, or syncope.   No recent hx of same, though has had iron deficiency anemia.  Last colonoscopy on record 2004, and last EGD 1994. No recent cdiff or other infectious diarrhea.  Pt is on eliquis for afib.  Pt denies wheezing, orthopnea, PND, increased LE swelling, palpitations. Pt denies new neurological symptoms such as new headache, or facial or extremity weakness or numbness over baseline.   Pt denies polydipsia, polyuria.   Past Medical History:  Diagnosis Date  . Aphasia 01/09/2009  . Arthritis    right hand  . 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 CEREBROVASCULAR ACCIDENT, HX OF 11/20/2007  . CHF (congestive heart failure) ([place])   . Coronary artery disease   . CVA 01/09/2009  . DEMENTIA 05/19/2008  . Dementia ([place])   . DIVERTICULOSIS, COLON 11/20/2007  . FATIGUE 11/20/2007  . Femoral fracture ([place]) 07/01/2013  . GLUCOSE INTOLERANCE 12/06/2010  . HYPERLIPIDEMIA 11/20/2007  . HYPERTENSION 11/20/2007   Echo was done 07/07/11: mod LVH, EF 55-60%, grade 1 diast dysfxn, mild MR, mild LAE, mod TR, PASP 39  . HYPOTHYROIDISM 11/20/2007  . Impaired glucose tolerance 06/03/2011  . IRON DEFICIENCY  12/06/2010  . Pacemaker 2002  . PACEMAKER, PERMANENT 01/09/2009  . TIA 05/14/2003   elevated CE's in setting of TIA and falls in 10/12 - echo normal with no further cardiac w/u pursued  . TIA (transient ischemic attack) 10/10/2011  . Urinary incontinence 01/09/2009   Past Surgical History:  Procedure Laterality Date  . ABDOMINAL HYSTERECTOMY    . CHOLECYSTECTOMY    . CYSTOCELE REPAIR N/A 11/26/2012   Procedure: Cystocele Repair with Lefort Colpocleisis.  colpectomy Levatorplasty Perineorraphy;  Surgeon: Lovenia Kim, MD;  Location: [facility];  Service: Gynecology;  Laterality: N/A;  Cystocele Repair with Lefort Colpocleisis.  Marland Kitchen HIP ARTHROPLASTY Left 07/02/2013   Procedure: ARTHROPLASTY BIPOLAR HIP;  Surgeon: Mauri Pole, MD;  Location: [facility];  Service: Orthopedics;  Laterality: Left;  . INGUINAL HERNIA REPAIR Right 08/13/2013   Procedure: HERNIA REPAIR INGUINAL INCARCERATED;  Surgeon: Ralene Ok, MD;  Location: [facility];  Service: General;  Laterality: Right;  . Medtronic Cynthia dual chamber pacemaker serial number was [id number] H...04/15/2009    . PPM GENERATOR CHANGEOUT N/A 04/15/2019   Procedure: PPM GENERATOR CHANGEOUT;  Surgeon: Evans Lance, MD;  Location: [facility];  Service: Cardiovascular;  Laterality: N/A;  . s/p cerebral aneurysm  1991  . s/p pacemaker DDD    . s/p retinal attachment    . s/p thyroidectomy      reports that she has never smoked. She has never used smokeless tobacco. She reports that she does not drink alcohol or use drugs. family  history includes Cancer in her mother. Allergies  Allergen Reactions  . Codeine Rash   Current Outpatient Medications on File Prior to Visit  Medication Sig Dispense Refill  . ALPRAZolam (XANAX) 0.25 MG tablet Take 0.25 mg by mouth daily as needed for anxiety.    Marland Kitchen amLODipine (NORVASC) 10 MG tablet TAKE 1 TABLET BY MOUTH DAILY. RESUME IN 3 DAYS IS SYSTOLIC BLOOD PRESSURE IS GREATER THAN 150. (Patient taking differently:  Take 10 mg by mouth daily. ) 90 tablet 1  . apixaban (ELIQUIS) 5 MG TABS tablet Take 1 tablet (5 mg total) by mouth 2 (two) times daily. 60 tablet 2  . Calcium Carbonate-Vitamin D (CALTRATE 600+D) 600-400 MG-UNIT per tablet Take 1 tablet by mouth at bedtime.     . docusate sodium (COLACE) 100 MG capsule Take 100 mg by mouth daily.    Marland Kitchen donepezil (ARICEPT) 10 MG tablet TAKE 1 TABLET(10 MG) BY MOUTH DAILY 90 tablet 1  . furosemide (LASIX) 20 MG tablet Take 1 tablet (20 mg total) by mouth daily. 90 tablet 3  . levothyroxine (SYNTHROID) 125 MCG tablet TAKE 1 TABLET(125 MCG) BY MOUTH DAILY (Patient taking differently: Take 125 mcg by mouth daily before breakfast. ) 90 tablet 1  . Multiple Vitamins-Minerals (CENTRUM SILVER 50+WOMEN) TABS Take 1 tablet by mouth daily.    . pantoprazole (PROTONIX) 40 MG tablet Take 1 tablet (40 mg total) by mouth daily. 90 tablet 1  . polyvinyl alcohol (LIQUIFILM TEARS) 1.4 % ophthalmic solution Place 1 drop into both eyes 3 (three) times daily.    . pravastatin (PRAVACHOL) 10 MG tablet Take 1 tablet (10 mg total) by mouth at bedtime. 90 tablet 1  . valsartan (DIOVAN) 80 MG tablet Take 1 tablet (80 mg total) by mouth daily. Take 80 mg by mouth daily. (Patient taking differently: Take 80 mg by mouth daily. ) 90 tablet 1   No current facility-administered medications on file prior to visit.    Observations/Objective: Alert, NAD, appropriate mood and affect, resps normal, cn 2-12 intact, moves all 4s, no visible rash or swelling Lab Results  Component Value Date   WBC 8.4 06/25/2019   HGB 14.5 06/25/2019   HCT 43.3 06/25/2019   PLT 315 06/25/2019   GLUCOSE 85 04/11/2019   CHOL 101 12/03/2018   TRIG 66 12/03/2018   HDL 40 (L) 12/03/2018   LDLDIRECT 73.4 11/18/2009   LDLCALC 48 12/03/2018   ALT 18 02/22/2019   AST 24 02/22/2019   NA 137 04/11/2019   K 4.0 04/11/2019   CL 99 04/11/2019   CREATININE 1.05 (H) 04/11/2019   BUN 17 04/11/2019   CO2 25 04/11/2019    TSH 0.754 02/20/2019   INR 1.1 12/03/2018   HGBA1C 5.6 12/03/2018   Assessment and Plan: See notes  Follow Up Instructions: See notes   I discussed the assessment and treatment plan with the patient. The patient was provided an opportunity to ask questions and all were answered. The patient agreed with the plan and demonstrated an understanding of the instructions.   The patient was advised to call back or seek an in-person evaluation if the symptoms worsen or if the condition fails to improve as anticipated   Cathlean Cower, MD

## 2019-06-25 NOTE — ED Triage Notes (Signed)
Pt brought in with family from [facility] for rectal bleeding x 2 days. Pt has dementia. Pt takes Eliquis.

## 2019-06-25 NOTE — Assessment & Plan Note (Signed)
etilogy unclear, diff includes infectious or inflammatory vs GI bleed; to hold eliquis and report to ED ([facility]) now for further evaluation; daughter in law agrees

## 2019-06-25 NOTE — Assessment & Plan Note (Signed)
stable overall by history and exam, recent data reviewed with pt, and pt to continue medical treatment as before,  to f/u any worsening symptoms or concerns  

## 2019-06-25 NOTE — Assessment & Plan Note (Signed)
Will need cbc with labs

## 2019-06-25 NOTE — Discharge Instructions (Addendum)
[name] was seen in the emergency department today after having 2 days of blood in her bowel movements.  In the emergency department she was seen by physician had a blood test done.  Her blood level (hemoglobin) was completely normal (14.5).  She did have microscopic blood in her stool, but no evidence of active or brisk bowel bleeding on a rectal exam.  Her vital signs are normal.  She had no tenderness in her belly or fevers to suggest an infection.  We felt it was safe to discharge her.  It is very likely that her bleeding is caused by her diverticulosis.  This will likely resolve in the next 1 to 2 weeks.  If her bleeding persists, or if she develops fevers, or if her bleeding worsens, please bring her back to the emergency department.  Is also important to follow-up with her primary care physician.  The patient is on Eliquis, a blood thinner, which may be worsening her bleeding problems.  Discussion should be had with her doctor as to the risks and benefits of continuing this blood thinner medication.  Finally, [name] was noted to have very painful hemorrhoids on exam.  I prescribed her a hemorrhoid cream to help with the swelling.

## 2019-06-25 NOTE — ED Provider Notes (Signed)
[facility] DEPT Provider Note   CSN: [id number] Arrival date & time: 06/25/19  1641     History   Chief Complaint Chief Complaint  Patient presents with  . Rectal Bleeding    HPI [name] is a 85 y.o. female with a history of stroke, permanent pacemaker, TIA, on Eliquis, presenting to the emergency department with bloody bowel movements.  Patient has advanced dementia cannot provide a reliable history.  Her history is provided by her daughter-in-law at bedside.  Her daughter-in-law reports at home health aides contacted the family reporting the patient been having 2 days of bloody bowel movements.  The patient is incontinent and wears diapers at baseline.  Staff reports her stool is a mixture of dark blood and bright red blood.  The patient has not had any vomiting.  She is tolerating p.o. well.  She has not complained of any abdominal pain.  There have not been any noted fevers.  There have been no episodes of syncope.  The patient's last colonoscopy report visible from 1993, at that time she was noted to have diverticulosis and internal hemorrhoids.     HPI  Past Medical History:  Diagnosis Date  . Aphasia 01/09/2009  . Arthritis    right hand  . 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 CEREBROVASCULAR ACCIDENT, HX OF 11/20/2007  . CHF (congestive heart failure) ([place])   . Coronary artery disease   . CVA 01/09/2009  . DEMENTIA 05/19/2008  . Dementia ([place])   . DIVERTICULOSIS, COLON 11/20/2007  . FATIGUE 11/20/2007  . Femoral fracture ([place]) 07/01/2013  . GLUCOSE INTOLERANCE 12/06/2010  . HYPERLIPIDEMIA 11/20/2007  . HYPERTENSION 11/20/2007   Echo was done 07/07/11: mod LVH, EF 55-60%, grade 1 diast dysfxn, mild MR, mild LAE, mod TR, PASP 39  . HYPOTHYROIDISM 11/20/2007  . Impaired glucose tolerance 06/03/2011  . IRON DEFICIENCY 12/06/2010  . Pacemaker 2002  . PACEMAKER, PERMANENT 01/09/2009  . TIA 05/14/2003   elevated CE's in setting of TIA and falls  in 10/12 - echo normal with no further cardiac w/u pursued  . TIA (transient ischemic attack) 10/10/2011  . Urinary incontinence 01/09/2009    Patient Active Problem List   Diagnosis Date Noted  . Bloody diarrhea 06/25/2019  . Pressure ulcer of sacral region, stage 2 ([place]) 02/21/2019  . Hypothermia 02/20/2019  . Dysphagia, post-stroke   . Global aphasia   . Acute right ACA stroke ([place]) 12/07/2018  . Stroke (cerebrum) ([place]) 12/03/2018  . Cough 10/11/2018  . Abnormal breath sounds 10/11/2018  . Closed nondisplaced fracture of proximal phalanx of lesser toe of right foot 06/26/2018  . Right ankle pain 06/21/2018  . Right foot pain 06/21/2018  . Abnormal CXR 04/26/2017  . Lobar pneumonia ([place]) 01/20/2017  . Acute on chronic respiratory failure with hypoxia ([place]) 01/17/2017  . Acute respiratory failure with hypoxia ([place]) 01/17/2017  . DOE (dyspnea on exertion) 10/23/2014  . Hyponatremia 10/23/2014  . RLL pneumonia ([place]) 10/11/2013  . Other specified anemias 10/10/2013  . Acute respiratory failure ([place]) 08/15/2013  . Aspiration pneumonia ([place]) 08/15/2013  . Altered mental status 08/15/2013  . Incarcerated femoral hernia 08/14/2013  . Esophageal reflux 08/05/2013  . Unspecified constipation 07/03/2013  . Closed fracture of left hip ([place]) 07/01/2013  . 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 Atrial fibrillation ([place]) 10/30/2012  . Low back pain 10/26/2012  . Syncope 10/26/2012  . PNA (pneumonia) 09/26/2012  . Acute lower UTI 09/26/2012  .  Hypokalemia 09/26/2012  . Leukocytosis 09/26/2012  . Cardiac enzymes elevated 07/25/2011  . Impaired glucose tolerance 06/03/2011  . Preventative health care 06/03/2011  . IRON DEFICIENCY 12/06/2010  . 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 CVA (cerebral vascular accident) ([place]) 01/09/2009  . Aphasia 01/09/2009  . Urinary incontinence 01/09/2009  . PACEMAKER, PERMANENT 01/09/2009  . Dementia ([place]) 05/19/2008  . Hypothyroidism  11/20/2007  . Hyperlipidemia 11/20/2007  . Essential hypertension 11/20/2007  . DIVERTICULOSIS, COLON 11/20/2007  . FATIGUE 11/20/2007  . CEREBROVASCULAR ACCIDENT, HX OF 11/20/2007  . TIA 05/14/2003    Past Surgical History:  Procedure Laterality Date  . ABDOMINAL HYSTERECTOMY    . CHOLECYSTECTOMY    . CYSTOCELE REPAIR N/A 11/26/2012   Procedure: Cystocele Repair with Lefort Colpocleisis.  colpectomy Levatorplasty Perineorraphy;  Surgeon: Lovenia Kim, MD;  Location: [facility];  Service: Gynecology;  Laterality: N/A;  Cystocele Repair with Lefort Colpocleisis.  Marland Kitchen HIP ARTHROPLASTY Left 07/02/2013   Procedure: ARTHROPLASTY BIPOLAR HIP;  Surgeon: Mauri Pole, MD;  Location: [facility];  Service: Orthopedics;  Laterality: Left;  . INGUINAL HERNIA REPAIR Right 08/13/2013   Procedure: HERNIA REPAIR INGUINAL INCARCERATED;  Surgeon: Ralene Ok, MD;  Location: [facility];  Service: General;  Laterality: Right;  . Medtronic Cynthia dual chamber pacemaker serial number was [id number] H...04/15/2009    . PPM GENERATOR CHANGEOUT N/A 04/15/2019   Procedure: PPM GENERATOR CHANGEOUT;  Surgeon: Evans Lance, MD;  Location: [facility];  Service: Cardiovascular;  Laterality: N/A;  . s/p cerebral aneurysm  1991  . s/p pacemaker DDD    . s/p retinal attachment    . s/p thyroidectomy       OB History   No obstetric history on file.      Home Medications    Prior to Admission medications   Medication Sig Start Date End Date Taking? Authorizing Provider  ALPRAZolam Duanne Moron) 0.25 MG tablet Take 0.25 mg by mouth daily as needed for anxiety.    [provider]  amLODipine (NORVASC) 10 MG tablet TAKE 1 TABLET BY MOUTH DAILY. RESUME IN 3 DAYS IS SYSTOLIC BLOOD PRESSURE IS GREATER THAN 150. Patient taking differently: Take 10 mg by mouth daily.  03/05/19   Biagio Borg, MD  apixaban (ELIQUIS) 5 MG TABS tablet Take 1 tablet (5 mg total) by mouth 2 (two) times daily. 05/27/19   Biagio Borg, MD   Calcium Carbonate-Vitamin D (CALTRATE 600+D) 600-400 MG-UNIT per tablet Take 1 tablet by mouth at bedtime.     [provider]  docusate sodium (COLACE) 100 MG capsule Take 100 mg by mouth daily.    [provider]  donepezil (ARICEPT) 10 MG tablet TAKE 1 TABLET(10 MG) BY MOUTH DAILY 03/05/19   Biagio Borg, MD  furosemide (LASIX) 20 MG tablet Take 1 tablet (20 mg total) by mouth daily. 05/27/19 08/25/19  Evans Lance, MD  hydrocortisone (ANUSOL-HC) 2.5 % rectal cream Place 1 application rectally 2 (two) times daily for 10 days. 06/25/19 07/05/19  Wyvonnia Dusky, MD  levothyroxine (SYNTHROID) 125 MCG tablet TAKE 1 TABLET(125 MCG) BY MOUTH DAILY Patient taking differently: Take 125 mcg by mouth daily before breakfast.  03/05/19   Biagio Borg, MD  Multiple Vitamins-Minerals (CENTRUM SILVER 50+WOMEN) TABS Take 1 tablet by mouth daily.    [provider]  pantoprazole (PROTONIX) 40 MG tablet Take 1 tablet (40 mg total) by mouth daily. 03/07/19   Biagio Borg, MD  polyvinyl alcohol (LIQUIFILM  TEARS) 1.4 % ophthalmic solution Place 1 drop into both eyes 3 (three) times daily.    [provider]  pravastatin (PRAVACHOL) 10 MG tablet Take 1 tablet (10 mg total) by mouth at bedtime. 03/07/19   Biagio Borg, MD  valsartan (DIOVAN) 80 MG tablet Take 1 tablet (80 mg total) by mouth daily. Take 80 mg by mouth daily. Patient taking differently: Take 80 mg by mouth daily.  03/07/19   Biagio Borg, MD    Family History Family History  Problem Relation Age of Onset  . Cancer Mother        breast cancer    Social History Social History   Tobacco Use  . Smoking status: Never Smoker  . Smokeless tobacco: Never Used  Substance Use Topics  . Alcohol use: No  . Drug use: No     Allergies   Codeine   Review of Systems Review of Systems  Unable to perform ROS: Dementia     Physical Exam Updated Vital Signs BP (!) 135/57   Pulse (!) 59   Temp 98.3 F (36.8  C) (Oral)   Resp 20   Ht 5\' 5"  (1.651 m)   Wt 65.8 kg   SpO2 97%   BMI 24.13 kg/m   Physical Exam Vitals signs and nursing note reviewed.  Constitutional:      General: She is not in acute distress.    Appearance: She is well-developed.  HENT:     Head: Normocephalic and atraumatic.  Eyes:     Conjunctiva/sclera: Conjunctivae normal.  Neck:     Musculoskeletal: Neck supple.  Cardiovascular:     Rate and Rhythm: Normal rate and regular rhythm.     Pulses: Normal pulses.  Pulmonary:     Effort: Pulmonary effort is normal. No respiratory distress.  Abdominal:     General: Bowel sounds are normal.     Palpations: Abdomen is soft.     Tenderness: There is no abdominal tenderness. There is no right CVA tenderness, left CVA tenderness, guarding or rebound. Negative signs include Murphy's sign and McBurney's sign.  Genitourinary:    Comments: Rectal exam with tender external and internal hemorrhoids, without active bleeding No melena Hemoccult positive Skin:    General: Skin is warm and dry.  Neurological:     Mental Status: She is alert.     Comments: Responds and laughs intermittently, at random, oriented x 1      ED Treatments / Results  Labs (all labs ordered are listed, but only abnormal results are displayed) Labs Reviewed  POC OCCULT BLOOD, ED - Abnormal; Notable for the following components:      Result Value   Fecal Occult Bld POSITIVE (*)    All other components within normal limits  CBC  TYPE AND SCREEN  ABO/RH    EKG None  Radiology No results found.  Procedures Procedures (including critical care time)  Medications Ordered in ED Medications - No data to display   Initial Impression / Assessment and Plan / ED Course  I have reviewed the triage vital signs and the nursing notes.  Pertinent labs & imaging results that were available during my care of the patient were reviewed by me and considered in my medical decision making (see chart for  details).   85 year old female Eliquis presenting with 2 days of bloody bowel movements.  She has known history of diverticulosis and internal hemorrhoids.  Patient here appears stable with normal vital signs.  Her  hemoglobin is within normal limits.  Pending a rectal exam.  I suspect this likely diverticular bleed, less likely a brisk GI bleed requiring urgent intervention or hospitalization at this time.  I also much lower suspicion for diverticulitis or colitis given that she has no abdominal tenderness or discomfort, has no leukocytosis or fever.  With hgb completely in normal limits, okay for discharge.  Will ask daughter in law to f/u with the patient's PCP regarding continued eliquis use.  This is a risk and benefit conversation that is best had with her primary care provider.    Final Clinical Impressions(s) / ED Diagnoses   Final diagnoses:  Rectal bleeding  Hemorrhoids, unspecified hemorrhoid type    ED Discharge Orders         Ordered    hydrocortisone (ANUSOL-HC) 2.5 % rectal cream  2 times daily     06/25/19 2018           Wyvonnia Dusky, MD 06/26/19 0200

## 2019-06-26 LAB — ABO/RH: ABO/RH(D): A POS

## 2019-06-27 ENCOUNTER — Other Ambulatory Visit: Payer: Self-pay | Admitting: *Deleted

## 2019-06-27 NOTE — Patient Outreach (Signed)
ED ALERT: Pt seen for rectal bleeding, evaluated and discharged back to [facility] with additional private pay oversight. Pt on Eliquis and instructed to continue.  Called and discussed pt care with her daughter-in-law, Keilani Ante, who expresses confidence in her mother-in-law's care and supervision at [facility].  Note pt DOES have a HCPOA dated from 08/24/13. Have requested this to be uploaded to [facility] for reference.  Provided my information for future contact with Mrs. Ilona Sorrel. No further contact necessary at this time.  Eulah Pont. Myrtie Neither, MSN, [facility] Nurse Practitioner [facility] Care Management [phone]

## 2019-07-11 ENCOUNTER — Telehealth: Payer: Self-pay | Admitting: Internal Medicine

## 2019-07-11 NOTE — Telephone Encounter (Signed)
Patient's daughter in law called wanting to know if [name]'s appt can be change is a virtual appt.  Patient lives in an assisted living facility, if she comes to appt she will need to be quarantine.

## 2019-07-12 NOTE — Telephone Encounter (Signed)
[place] for [employer].  Notified scheduling

## 2019-07-16 ENCOUNTER — Other Ambulatory Visit: Payer: Self-pay

## 2019-07-16 ENCOUNTER — Telehealth (INDEPENDENT_AMBULATORY_CARE_PROVIDER_SITE_OTHER): Payer: Medicare Other | Admitting: Internal Medicine

## 2019-07-16 DIAGNOSIS — I4821 Permanent atrial fibrillation: Secondary | ICD-10-CM | POA: Diagnosis not present

## 2019-07-16 DIAGNOSIS — Z950 Presence of cardiac pacemaker: Secondary | ICD-10-CM

## 2019-07-16 NOTE — Progress Notes (Signed)
Electrophysiology TeleHealth Note   Due to national recommendations of social distancing due to COVID 19, an audio/video telehealth visit is felt to be most appropriate for this patient at this time.  See MyChart message from today for the patient's consent to telehealth for [facility].   Date:  07/16/2019   ID:  [name], DOB 12/30/1921, MRN [id number]  Location: patient's home  Provider location: [address] [place]  Evaluation Performed: Follow-up visit  PCP:  Biagio Borg, MD  Cardiologist:  No primary care provider on file.  Electrophysiologist:  Dr Lovena Le  Chief Complaint:  She has been doing alright.  History of Present Illness:    [name] is a 85 y.o. female who presents via audio/video conferencing for a telehealth visit today. Her grand daughter is her power of attorney for health and provides the information.   Since last being seen in our clinic, the patient reports doing very well except for peripheral edema.  Today, she denies symptoms of palpitations, chest pain, shortness of breath, dizziness, presyncope, or syncope.  The patient is otherwise without complaint today.  The patient denies symptoms of fevers, chills, cough, or new SOB worrisome for COVID 19.  Past Medical History:  Diagnosis Date  . Aphasia 01/09/2009  . Arthritis    right hand  . 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 CEREBROVASCULAR ACCIDENT, HX OF 11/20/2007  . CHF (congestive heart failure) ([place])   . Coronary artery disease   . CVA 01/09/2009  . DEMENTIA 05/19/2008  . Dementia ([place])   . DIVERTICULOSIS, COLON 11/20/2007  . FATIGUE 11/20/2007  . Femoral fracture ([place]) 07/01/2013  . GLUCOSE INTOLERANCE 12/06/2010  . HYPERLIPIDEMIA 11/20/2007  . HYPERTENSION 11/20/2007   Echo was done 07/07/11: mod LVH, EF 55-60%, grade 1 diast dysfxn, mild MR, mild LAE, mod TR, PASP 39  . HYPOTHYROIDISM 11/20/2007  . Impaired glucose tolerance 06/03/2011  . IRON DEFICIENCY  12/06/2010  . Pacemaker 2002  . PACEMAKER, PERMANENT 01/09/2009  . TIA 05/14/2003   elevated CE's in setting of TIA and falls in 10/12 - echo normal with no further cardiac w/u pursued  . TIA (transient ischemic attack) 10/10/2011  . Urinary incontinence 01/09/2009    Past Surgical History:  Procedure Laterality Date  . ABDOMINAL HYSTERECTOMY    . CHOLECYSTECTOMY    . CYSTOCELE REPAIR N/A 11/26/2012   Procedure: Cystocele Repair with Lefort Colpocleisis.  colpectomy Levatorplasty Perineorraphy;  Surgeon: Lovenia Kim, MD;  Location: [facility];  Service: Gynecology;  Laterality: N/A;  Cystocele Repair with Lefort Colpocleisis.  Marland Kitchen HIP ARTHROPLASTY Left 07/02/2013   Procedure: ARTHROPLASTY BIPOLAR HIP;  Surgeon: Mauri Pole, MD;  Location: [facility];  Service: Orthopedics;  Laterality: Left;  . INGUINAL HERNIA REPAIR Right 08/13/2013   Procedure: HERNIA REPAIR INGUINAL INCARCERATED;  Surgeon: Ralene Ok, MD;  Location: [facility];  Service: General;  Laterality: Right;  . Medtronic Cynthia dual chamber pacemaker serial number was [id number] H...04/15/2009    . PPM GENERATOR CHANGEOUT N/A 04/15/2019   Procedure: PPM GENERATOR CHANGEOUT;  Surgeon: Evans Lance, MD;  Location: [facility];  Service: Cardiovascular;  Laterality: N/A;  . s/p cerebral aneurysm  1991  . s/p pacemaker DDD    . s/p retinal attachment    . s/p thyroidectomy      Current Outpatient Medications  Medication Sig Dispense Refill  . ALPRAZolam (XANAX) 0.25 MG tablet Take 0.25 mg by mouth daily as needed for  anxiety.    Marland Kitchen amLODipine (NORVASC) 10 MG tablet TAKE 1 TABLET BY MOUTH DAILY. RESUME IN 3 DAYS IS SYSTOLIC BLOOD PRESSURE IS GREATER THAN 150. (Patient taking differently: Take 10 mg by mouth daily. ) 90 tablet 1  . apixaban (ELIQUIS) 5 MG TABS tablet Take 1 tablet (5 mg total) by mouth 2 (two) times daily. 60 tablet 2  . Calcium Carbonate-Vitamin D (CALTRATE 600+D) 600-400 MG-UNIT per tablet Take 1 tablet by mouth  at bedtime.     . docusate sodium (COLACE) 100 MG capsule Take 100 mg by mouth daily.    Marland Kitchen donepezil (ARICEPT) 10 MG tablet TAKE 1 TABLET(10 MG) BY MOUTH DAILY 90 tablet 1  . furosemide (LASIX) 20 MG tablet Take 1 tablet (20 mg total) by mouth daily. 90 tablet 3  . levothyroxine (SYNTHROID) 125 MCG tablet TAKE 1 TABLET(125 MCG) BY MOUTH DAILY (Patient taking differently: Take 125 mcg by mouth daily before breakfast. ) 90 tablet 1  . Multiple Vitamins-Minerals (CENTRUM SILVER 50+WOMEN) TABS Take 1 tablet by mouth daily.    . pantoprazole (PROTONIX) 40 MG tablet Take 1 tablet (40 mg total) by mouth daily. 90 tablet 1  . polyvinyl alcohol (LIQUIFILM TEARS) 1.4 % ophthalmic solution Place 1 drop into both eyes 3 (three) times daily.    . pravastatin (PRAVACHOL) 10 MG tablet Take 1 tablet (10 mg total) by mouth at bedtime. 90 tablet 1  . valsartan (DIOVAN) 80 MG tablet Take 1 tablet (80 mg total) by mouth daily. Take 80 mg by mouth daily. (Patient taking differently: Take 80 mg by mouth daily. ) 90 tablet 1   No current facility-administered medications for this visit.     Allergies:   Codeine   Social History:  The patient  reports that she has never smoked. She has never used smokeless tobacco. She reports that she does not drink alcohol or use drugs.    ROS:  Please see the history of present illness.   All other systems are personally reviewed and negative.    Exam:    Vital Signs:  There were no vitals taken for this visit.  Well appearing, alert and conversant, regular work of breathing,  good skin color Eyes- anicteric, neuro- grossly intact, skin- no apparent rash or lesions or cyanosis, mouth- oral mucosa is pink   Labs/Other Tests and Data Reviewed:    Recent Labs: 02/20/2019: TSH 0.754 02/22/2019: ALT 18 04/11/2019: BUN 17; Creatinine, Ser 1.05; Potassium 4.0; Sodium 137 06/25/2019: Hemoglobin 14.5; Platelets 315   Wt Readings from Last 3 Encounters:  06/25/19 145 lb (65.8  kg)  05/23/19 141 lb (64 kg)  04/15/19 145 lb (65.8 kg)     Other studies personally reviewed:  Last device remote is reviewed from [facility] PDF dated 04/30/19 which reveals normal device function, no arrhythmias    ASSESSMENT & PLAN:    1.  CHB - she is stable s/p PPM insertion 2. Atrial fib - her rates are controlled. 3. Peripheral edema - I discussed the importance of a low sodium diet. I also discussed lasix. I recommended that her weight be observed and if the weight goes up, take lasix for 3 days.    Follow-up:  6 months Next remote: 11/20  Current medicines are reviewed at length with the patient today.   The patient does not have concerns regarding her medicines.  The following changes were made today:  none  Labs/ tests ordered today include: none No orders of the defined  types were placed in this encounter.    Patient Risk:  after full review of this patients clinical status, I feel that they are at moderate risk at this time.  Today, I have spent 15 minutes with the patient with telehealth technology discussing all of the above .    Signed, [name], MD  07/16/2019 2:08 PM     [facility] [address] [place] [address] [phone] (office) [phone] (fax)

## 2019-08-26 ENCOUNTER — Telehealth (INDEPENDENT_AMBULATORY_CARE_PROVIDER_SITE_OTHER): Payer: Medicare Other | Admitting: Adult Health

## 2019-08-26 ENCOUNTER — Encounter: Payer: Self-pay | Admitting: Adult Health

## 2019-08-26 DIAGNOSIS — I63521 Cerebral infarction due to unspecified occlusion or stenosis of right anterior cerebral artery: Secondary | ICD-10-CM

## 2019-08-26 DIAGNOSIS — I10 Essential (primary) hypertension: Secondary | ICD-10-CM

## 2019-08-26 DIAGNOSIS — I480 Paroxysmal atrial fibrillation: Secondary | ICD-10-CM | POA: Diagnosis not present

## 2019-08-26 DIAGNOSIS — E785 Hyperlipidemia, unspecified: Secondary | ICD-10-CM

## 2019-08-26 DIAGNOSIS — F0390 Unspecified dementia without behavioral disturbance: Secondary | ICD-10-CM | POA: Diagnosis not present

## 2019-08-26 NOTE — Progress Notes (Signed)
[facility] [address] [place]. [place] [address] [phone]       STROKE FOLLOW UP NOTE  [name] Date of Birth:  December 05, 1921 Medical Record Number:  [id number]   Reason for Referral: stroke follow up  Virtual Visit via Video Note  I connected with [name] on 08/26/19 at 11:15 AM EST by a video enabled telemedicine application located at [facility] and verified that I am speaking with the correct person using two identifiers who was located at their own home.   I discussed the limitations of evaluation and management by telemedicine and the availability of in person appointments. The patient expressed understanding and agreed to proceed.   CHIEF COMPLAINT:  No chief complaint on file.   HPI: Stroke admission 12/03/2018: JessicaJean A Coltraneis a 85 y.o.femalewith history of a permanent pacemaker for complete AV block, previous strokes, cerebral aneurysm clipping in 1993, hypothyroidism, hypertension, hyperlipidemia, dementia, glucose intolerance, coronary artery disease, and congestive heart failure  who presented on 12/03/2018 with right-sided weakness and aphasia. Neurology consulted with stroke work-up revealing left frontal/ACA territory infarct likely due to A. fib not on AC with residual right arm paresis and paucity of speech.  Initial CT head showed nonhemorrhagic left frontal/ACA territory infarct and multiple old infarcts.  Due to residual deficits, received IV TPA.  CTA head/neck showed emergent left A2 occlusion, severe left and moderate right supraclinoid ICA stenosis, occluded left MCA aneurysm clip with intermediate collaterals and right ICA bulb atherosclerosis.  Repeat CT head showed evolving acute left frontal/ACA territory infarct with new hemorrhagic conversion.  Unable to undergo MRI due to pacer.  2D echo EF 55%.  Prior finding of atrial fibrillation via pacer but was previously deemed not AC candidate  by cardiology due to history of cerebral aneurysm but since aneurysm has been secured with clipping, possible candidate for [facility] due to history of multiple strokes.  Is recommended to follow-up with CT scan in 1 week's time and Dr. Leonie Man decide on possible use of DO [facility] with cardiology agreement.  She was started on aspirin 81 mg daily for secondary stroke prevention.  HTN stable.  LDL 48 and recommended continuation of pravastatin 40 mg daily. Underlying history of dementia stable on Aricept.  Other stroke risk factors include advanced age, CAD and CHF.  She was discharged to [facility] for ongoing therapy for residual deficits.  During inpatient rehab, repeat CT head stable and was recommended to initiate Eliquis for atrial fibrillation and secondary stroke prevention.  She was discharged to SNF on 12/26/2018.  Initial visit 05/23/2019: She is being seen today for hospital stroke follow-up visit and accompanied by her daughter-in-law.  She continues to reside at [facility] independent living.  Residual deficits of mild right hemiparesis and slightly worsened expressive aphasia from baseline but improving since discharge.  Underlying history of dementia with worsening after stroke.  Currently receiving therapy through [facility] and will receive for additional 2 weeks.  Continues to use rolling walker for assistance with difficulty in pivoting/turning.  She continues to receive assistance through privately paid resource with assistance of ADLs and safety.  She continues on Aricept 10 mg daily.  Discussion regarding possible underlying depression which patient denies but per daughter-in-law, she does make comments frequently about family being unable to visit due to COVID-19 restrictions and lack of activities at facility.  She continues on Eliquis without bleeding or bruising.  Continues on pravastatin without myalgias.  Blood pressure today satisfactory at  123/71.  She continues to follow with cardiologist Dr. Lovena Le with  recently undergoing PPM generator change out and per daughter-in-law, new pacer is not compatible with MRI.  No further concerns at this time.  Update via virtual visit 08/26/2019: [name] is a 85 year old female who is being seen today for stroke follow-up via virtual visit due to COVID-19 precautions accompanied by her daughter.  She continues to reside at [facility] independent [facility].  Due to facility COVID-19 restrictions, family is able to visit 1 time weekly and daughter-in-law can assist as needed with scheduled virtual visits.  Residual stroke deficits of right hemiparesis and aphasia stable.  Able to ambulate short distances with Rollator walker but denies any recent falls.  Will use wheelchair for long distance.  Dementia has slightly worsened but daughter feels this is due to lack of stimulation and conversation with family. She continues on Aricept 10 mg daily without side effects.  She continues on Eliquis for atrial fibrillation and secondary stroke prevention without side effects of recent bleeding or bruising.  She was evaluated in the ED in September due to rectal bleeding secondary to large hemorrhoids.  Lab work stable during that time.  She has not had any additional bleeding or hemorrhoid complications.  Continues on pravastatin without myalgias.  Blood pressure not routinely monitored at facility.  No further concerns at this time.    ROS:   14 system review of systems performed and negative with exception of weakness, speech difficulty, depression, anxiety, confusion and memory loss  PMH:  Past Medical History:  Diagnosis Date   Aphasia 01/09/2009   Arthritis    right hand   AV BLOCK, COMPLETE 01/09/2009   CEREBROVASCULAR ACCIDENT, HX OF 11/20/2007   CHF (congestive heart failure) (HCC)    Coronary artery disease    CVA 01/09/2009   DEMENTIA 05/19/2008   Dementia ([place])    DIVERTICULOSIS, COLON 11/20/2007   FATIGUE 11/20/2007   Femoral fracture ([place])  07/01/2013   GLUCOSE INTOLERANCE 12/06/2010   HYPERLIPIDEMIA 11/20/2007   HYPERTENSION 11/20/2007   Echo was done 07/07/11: mod LVH, EF 55-60%, grade 1 diast dysfxn, mild MR, mild LAE, mod TR, PASP 39   HYPOTHYROIDISM 11/20/2007   Impaired glucose tolerance 06/03/2011   IRON DEFICIENCY 12/06/2010   Pacemaker 2002   PACEMAKER, PERMANENT 01/09/2009   TIA 05/14/2003   elevated CE's in setting of TIA and falls in 10/12 - echo normal with no further cardiac w/u pursued   TIA (transient ischemic attack) 10/10/2011   Urinary incontinence 01/09/2009    PSH:  Past Surgical History:  Procedure Laterality Date   ABDOMINAL HYSTERECTOMY     CHOLECYSTECTOMY     CYSTOCELE REPAIR N/A 11/26/2012   Procedure: Cystocele Repair with Lefort Colpocleisis.  colpectomy Levatorplasty Perineorraphy;  Surgeon: Lovenia Kim, MD;  Location: [facility];  Service: Gynecology;  Laterality: N/A;  Cystocele Repair with Lefort Colpocleisis.   HIP ARTHROPLASTY Left 07/02/2013   Procedure: ARTHROPLASTY BIPOLAR HIP;  Surgeon: Mauri Pole, MD;  Location: [facility];  Service: Orthopedics;  Laterality: Left;   INGUINAL HERNIA REPAIR Right 08/13/2013   Procedure: HERNIA REPAIR INGUINAL INCARCERATED;  Surgeon: Ralene Ok, MD;  Location: [facility];  Service: General;  Laterality: Right;   Medtronic Cynthia dual chamber pacemaker serial number was [id number] H...04/15/2009     PPM GENERATOR CHANGEOUT N/A 04/15/2019   Procedure: PPM GENERATOR CHANGEOUT;  Surgeon: Evans Lance, MD;  Location: [facility];  Service: Cardiovascular;  Laterality: N/A;  s/p cerebral aneurysm  1991   s/p pacemaker DDD     s/p retinal attachment     s/p thyroidectomy      Social History:  Social History   Socioeconomic History   Marital status: Divorced    Spouse name: Not on file   Number of children: Not on file   Years of education: Not on file   Highest education level: Not on file  Occupational History    Occupation: retired Radiation protection practitioner, lives at [employer] resource strain: Not on file   Food insecurity    Worry: Not on file    Inability: Not on file   Transportation needs    Medical: Not on file    Non-medical: Not on file  Tobacco Use   Smoking status: Never Smoker   Smokeless tobacco: Never Used  Substance and Sexual Activity   Alcohol use: No   Drug use: No   Sexual activity: Not on file  Lifestyle   Physical activity    Days per week: Not on file    Minutes per session: Not on file   Stress: Not on file  Relationships   Social connections    Talks on phone: Not on file    Gets together: Not on file    Attends religious service: Not on file    Active member of club or organization: Not on file    Attends meetings of clubs or organizations: Not on file    Relationship status: Not on file   Intimate partner violence    Fear of current or ex partner: Not on file    Emotionally abused: Not on file    Physically abused: Not on file    Forced sexual activity: Not on file  Other Topics Concern   Not on file  Social History Narrative   Not on file    Family History:  Family History  Problem Relation Age of Onset   Cancer Mother        breast cancer    Medications:   Current Outpatient Medications on File Prior to Visit  Medication Sig Dispense Refill   ALPRAZolam (XANAX) 0.25 MG tablet Take 0.25 mg by mouth daily as needed for anxiety.     amLODipine (NORVASC) 10 MG tablet TAKE 1 TABLET BY MOUTH DAILY. RESUME IN 3 DAYS IS SYSTOLIC BLOOD PRESSURE IS GREATER THAN 150. (Patient taking differently: Take 10 mg by mouth daily. ) 90 tablet 1   apixaban (ELIQUIS) 5 MG TABS tablet Take 1 tablet (5 mg total) by mouth 2 (two) times daily. 60 tablet 2   Calcium Carbonate-Vitamin D (CALTRATE 600+D) 600-400 MG-UNIT per tablet Take 1 tablet by mouth at bedtime.      docusate sodium (COLACE) 100 MG capsule Take 100 mg by mouth daily.      donepezil (ARICEPT) 10 MG tablet TAKE 1 TABLET(10 MG) BY MOUTH DAILY 90 tablet 1   furosemide (LASIX) 20 MG tablet Take 1 tablet (20 mg total) by mouth daily. 90 tablet 3   levothyroxine (SYNTHROID) 125 MCG tablet TAKE 1 TABLET(125 MCG) BY MOUTH DAILY (Patient taking differently: Take 125 mcg by mouth daily before breakfast. ) 90 tablet 1   Multiple Vitamins-Minerals (CENTRUM SILVER 50+WOMEN) TABS Take 1 tablet by mouth daily.     pantoprazole (PROTONIX) 40 MG tablet Take 1 tablet (40 mg total) by mouth daily. 90 tablet 1   polyvinyl alcohol (LIQUIFILM TEARS) 1.4 % ophthalmic solution Place 1  drop into both eyes 3 (three) times daily.     pravastatin (PRAVACHOL) 10 MG tablet Take 1 tablet (10 mg total) by mouth at bedtime. 90 tablet 1   valsartan (DIOVAN) 80 MG tablet Take 1 tablet (80 mg total) by mouth daily. Take 80 mg by mouth daily. (Patient taking differently: Take 80 mg by mouth daily. ) 90 tablet 1   No current facility-administered medications on file prior to visit.     Allergies:   Allergies  Allergen Reactions   Codeine Rash     Physical Exam  General: Very pleasant elderly Caucasian female, seated, in no evident distress Head: head normocephalic and atraumatic.     Neurologic Exam Mental Status: Awake and fully alert.   Mild expressive aphasia.  Oriented to self but disoriented to place and time. Recent and remote memory diminished. Attention span, concentration and fund of knowledge diminished. Mood and affect appropriate. Cranial Nerves: Extraocular movements full without nystagmus. Hearing intact to voice. Facial sensation intact.  Mild right lower facial weakness. Motor: Normal bulk and tone.  No evidence of large muscle weakness per drift assessment Sensory.: intact to touch , pinprick , position and vibratory sensation.  Coordination: Rapid alternating movements normal in all extremities.  Gait and Station: Gait assessment deferred Reflexes:  UTA      Diagnostic Data (Labs, Imaging, Testing)  Ct Angio Head W Or Wo Contrast Ct Angio Neck W Or Wo Contrast Ct Cerebral Perfusion W Contrast 12/03/2018 IMPRESSION:   CTA NECK: 1. No hemodynamically significant stenosis ICA's.  2. Patent vertebral arteries.  3.An incidental finding of potential clinical significance has been found. 3.9 cm ascending aorta.Recommend annual imaging followup by CTA or MRA. This recommendation follows 2010 ACCF/AHA/AATS/ACR/ASA/SCA/SCAI/SIR/STS/SVM Guidelines for the Diagnosis and Management of Patients with Thoracic Aortic Disease. Circulation.2010; [id number]. Aortic aneurysm NOS (ICD10-I71.9)**   CTA HEAD:  1. Emergent LEFT A2 occlusion,poor collateralization by single-phase CTA.  2. Severe LEFT and moderate RIGHT supraclinoid ICA stenosis.  3. Occluded LEFT MCA at aneurysm clip, intermediate collaterals by single-phase   CTA. CT PERFUSION:  1. Rapid data processing failed.    Ct Head Code Stroke Wo Contrast 12/03/2018 IMPRESSION:  1. Acute nonhemorrhagic LEFT frontal/ACA territory infarct.  2. Old large LEFT MCA territory infarct, status post clipping of LEFT MCA aneurysm.  3. Moderate to severe chronic small vessel ischemic changes. Old small basal ganglia, thalami and cerebellar infarcts. 4. ASPECTS is 10.   PORTABLE CHEST 1 VIEW 12/05/2018 (COMPARISON: 12/04/2018) IMPRESSION: Improving infiltration or atelectasis in the left lung base.    Transthoracic Echocardiogram -   1. The left ventricle has a visually estimated ejection fraction of of 55%. The cavity size was normal. There is moderately increased left ventricular wall thickness. Left ventricular diastolic Doppler parameters are indeterminate. No evidence of left  ventricular regional wall motion abnormalities. Septal-lateral dyssynchrony consistent with RV pacing. 2. The right ventricle has normal systolic function. The cavity was  normal. There is no increase in right ventricular wall thickness. 3. Left atrial size was severely dilated. 4. Right atrial size was mildly dilated. 5. The mitral valve is degenerative. Moderate calcification of the mitral valve leaflet. There is moderate to severe mitral annular calcification present. Mild mitral regurgitation. There is no more than mild mitral stenosis present, mean gradient 4  mmHg. 6. Tricuspid valve regurgitation is moderate. 7. The aortic valve is tricuspid Moderate calcification of the aortic valve. Aortic valve regurgitation is moderate by color flow Doppler. no stenosis of  the aortic valve. 8. The ascending aorta is normal in size and structure. 9. There is mild dilatation of the aortic root measuring 37 mm. 10. The inferior vena cava was dilated in size with <50% respiratory variability. PA systolic pressure 45 mmHg. 11. Trivial pericardial effusion is present.   ASSESSMENT: [name] is a 85 y.o. year old female presented with right-sided weakness and aphasia receiving IV TPA on 12/03/2018 with stroke work-up revealing left frontal/ACA territory infarct likely secondary to A. fib not on AC. Vascular risk factors include history of stroke, A. fib not on AC, HTN, HLD, dementia, CAD and CHF.  Residual deficits of expressive aphasia and possible very mild right hemiparesis unable to appreciate due to visit type    PLAN:  1. Left frontal/ACA territory infarct: Continue Eliquis (apixaban) daily  and pravastatin for secondary stroke prevention.  Recent bleeding secondary to hemorrhoids which have since subsided.  Due to risk versus benefit ratio, would recommend continuation of Eliquis at this time due to prior stroke not on anticoagulation with history of atrial fibrillation and high risk of recurrent stroke with  resolution of prior bleeding concerns.  Advised daughter-in-law to notify our office/cardiology with any future concerns.  Maintain strict control of hypertension with blood pressure goal below 130/90, diabetes with hemoglobin A1c goal below 6.5% and cholesterol with LDL cholesterol (bad cholesterol) goal below 70 mg/dL.  I also advised the patient to eat a healthy diet with plenty of whole grains, cereals, fruits and vegetables, exercise regularly with at least 30 minutes of continuous activity daily and maintain ideal body weight. 2. HTN: Advised to continue current treatment regimen.  Advised to continue to monitor at home along with continued follow-up with PCP for management 3. HLD: Advised to continue current treatment regimen along with continued follow-up with PCP for future prescribing and monitoring of lipid panel 4. Atrial fibrillation: Continue to follow with cardiologist Dr. Lovena Le for ongoing monitoring and management.  Also advised to continue to follow with Dr. Lovena Le for lower extremity edema  5. Dementia/depression: Ongoing use of Aricept 10 mg daily.  Slightly worsening cognition and will obtain MMSE at follow-up visit.   Follow up in 4 months or call earlier if needed   Greater than 50% of time during this 15 minute nonface-to-face visit was spent on counseling, explanation of diagnosis of left frontal/ACA territory infarct, reviewing risk factor management of HTN, HLD and atrial fibrillation along with discussion of slightly worsening dementia, Ongoing use of anticoagulation, planning of further management along with potential future management, and discussion with patient and family answering all questions.    Frann Rider, AGNP-BC  [facility] [address] [place], [place] [address]  Phone [phone] Fax [phone] Note: This document was prepared with digital dictation and possible smart phrase technology. Any transcriptional errors  that result from this process are unintentional.

## 2019-08-29 ENCOUNTER — Telehealth: Payer: Self-pay | Admitting: *Deleted

## 2019-08-29 ENCOUNTER — Telehealth (INDEPENDENT_AMBULATORY_CARE_PROVIDER_SITE_OTHER): Payer: Medicare Other | Admitting: Internal Medicine

## 2019-08-29 ENCOUNTER — Other Ambulatory Visit: Payer: Self-pay

## 2019-08-29 VITALS — Ht 65.0 in | Wt 150.0 lb

## 2019-08-29 DIAGNOSIS — I4821 Permanent atrial fibrillation: Secondary | ICD-10-CM | POA: Diagnosis not present

## 2019-08-29 DIAGNOSIS — I442 Atrioventricular block, complete: Secondary | ICD-10-CM | POA: Diagnosis not present

## 2019-08-29 DIAGNOSIS — R600 Localized edema: Secondary | ICD-10-CM

## 2019-08-29 MED ORDER — FUROSEMIDE 40 MG PO TABS: 40.0000 mg | ORAL_TABLET | Freq: Every day | ORAL | 3 refills | Status: DC

## 2019-08-29 NOTE — Patient Instructions (Addendum)
Medication Instructions:  Your physician has recommended you make the following change in your medication:   1.  Stop taking amlodipine  2.  Increase your lasix --Take 40 mg by mouth daily   Tina with [facility] will monitor your blood pressure for 2 weeks and submit the readings to our office [phone] Attn: Bing Neighbors)  If your systolic readings are consistently above 140 Dr. Lovena Le will have you start carvedilol 3.125 mg one tablet by mouth twice a day.  Please acquire knee high compression hose with support of 15-20 mmHG to assist with lower extremity edema.   Labwork: Dr. Lovena Le would like lab work 2 weeks after increasing your furosemide-basic metabolic profile-we will arrange to have someone come for labs/exam.   Testing/Procedures: None ordered.  Follow-Up: Your physician wants you to follow-up in: 3-4 months with Dr. Lovena Le in office.      Remote monitoring is used to monitor your Pacemaker from home. This monitoring reduces the number of office visits required to check your device to one time per year. It allows [place] to keep an eye on the functioning of your device to ensure it is working properly. You are scheduled for a device check from home on 10/17/2019. You may send your transmission at any time that day. If you have a wireless device, the transmission will be sent automatically. After your physician reviews your transmission, you will receive a postcard with your next transmission date.  Any Other Special Instructions Will Be Listed Below (If Applicable).  If you need a refill on your cardiac medications before your next appointment, please call your pharmacy.

## 2019-08-29 NOTE — Telephone Encounter (Signed)
[facility] Visit Initial Request  Date of Request ([place]):  August 29, 2019  Requesting Provider:  DR. [name]    Agency Requested:    [facility] Contact:  Glory Buff, NP [place], [place] [address] Phone #:  [phone] Fax #:  [phone]  Patient Demographic Information: Name:  [name] Age:  85 y.o.   DOB:  Mar 17, 1922  MRN:  [id number]   Address:   [place] [address]   Phone Numbers:   Home Phone [phone]  Mobile [phone]     Emergency Contact Information on File:   Contact Information    Name Relation Home Work [place] Son   [phone]   Zakira, Pahnke Relative   [phone]      The above family members may be contacted for information on this patient (review DPR on file):  Yes    Patient Clinical Information:  Primary Care Provider:  Biagio Borg, MD  Primary Cardiologist:  No primary care provider on file.  Primary Electrophysiologist:  None   Past Medical Hx: Ms. Famous  has a past medical history of Aphasia (01/09/2009), Arthritis, AV BLOCK, COMPLETE (01/09/2009), CEREBROVASCULAR ACCIDENT, HX OF (11/20/2007), CHF (congestive heart failure) ([place]), Coronary artery disease, CVA (01/09/2009), DEMENTIA (05/19/2008), Dementia ([place]), DIVERTICULOSIS, COLON (11/20/2007), FATIGUE (11/20/2007), Femoral fracture ([place]) (07/01/2013), GLUCOSE INTOLERANCE (12/06/2010), HYPERLIPIDEMIA (11/20/2007), HYPERTENSION (11/20/2007), HYPOTHYROIDISM (11/20/2007), Impaired glucose tolerance (06/03/2011), IRON DEFICIENCY (12/06/2010), Pacemaker (2002), PACEMAKER, PERMANENT (01/09/2009), TIA (05/14/2003), TIA (transient ischemic attack) (10/10/2011), and Urinary incontinence (01/09/2009).   Allergies: She is allergic to codeine.   Medications: Current Outpatient Medications on File Prior to Visit  Medication Sig  . ALPRAZolam (XANAX) 0.25 MG tablet Take 0.25 mg by mouth daily  as needed for anxiety.  Marland Kitchen apixaban (ELIQUIS) 5 MG TABS tablet Take 1 tablet (5 mg total) by mouth 2 (two) times daily.  . Calcium Carbonate-Vitamin D (CALTRATE 600+D) 600-400 MG-UNIT per tablet Take 1 tablet by mouth at bedtime.   . docusate sodium (COLACE) 100 MG capsule Take 100 mg by mouth daily.  Marland Kitchen donepezil (ARICEPT) 10 MG tablet TAKE 1 TABLET(10 MG) BY MOUTH DAILY  . furosemide (LASIX) 40 MG tablet Take 1 tablet (40 mg total) by mouth daily.  Marland Kitchen levothyroxine (SYNTHROID) 125 MCG tablet TAKE 1 TABLET(125 MCG) BY MOUTH DAILY (Patient taking differently: Take 125 mcg by mouth daily before breakfast. )  . Multiple Vitamins-Minerals (CENTRUM SILVER 50+WOMEN) TABS Take 1 tablet by mouth daily.  . pantoprazole (PROTONIX) 40 MG tablet Take 1 tablet (40 mg total) by mouth daily.  . polyvinyl alcohol (LIQUIFILM TEARS) 1.4 % ophthalmic solution Place 1 drop into both eyes 3 (three) times daily.  . pravastatin (PRAVACHOL) 10 MG tablet Take 1 tablet (10 mg total) by mouth at bedtime.  . valsartan (DIOVAN) 80 MG tablet Take 1 tablet (80 mg total) by mouth daily. Take 80 mg by mouth daily. (Patient taking differently: Take 80 mg by mouth daily. )   No current facility-administered medications on file prior to visit.      Social Hx: She  reports that she has never smoked. She has never used smokeless tobacco. She reports that she does not drink alcohol or use drugs.    Diagnosis/Reason for Visit:   A-FIB, LOWER EXTREMITY EDEMA;  1. STOP AMLODIPINE 2. INCREASE LASIX TO 40 MG DAILY 3. MONITOR BP FOR POSSIBLE NEED TO START CARVEDILOL 3.125 MG BID PO  Services Requested:  Vital Signs (BP, Pulse, O2, Weight)  Physical Exam  Medication Reconciliation  Labs:  BMET  # of Visits Needed/Frequency per Week: 1 VISIT IN 2 WEEKS

## 2019-08-29 NOTE — Progress Notes (Addendum)
Electrophysiology TeleHealth Note   Due to national recommendations of social distancing due to COVID 19, an audio/video telehealth visit is felt to be most appropriate for this patient at this time.  See MyChart message from today for the patient's consent to telehealth for [facility].   Date:  08/29/2019   ID:  [name], DOB 1921/11/17, MRN [id number]  Location: patient's home  Provider location: [address] [place]  Evaluation Performed: Follow-up visit  PCP:  Biagio Borg, MD  Cardiologist:  No primary care provider on file. Lovena Le Electrophysiologist:  Dr Lovena Le  Chief Complaint:  "My legs are swelling."  History of Present Illness:    [name] is a 85 y.o. female who presents via audio/video conferencing for a telehealth visit today.  Since last being seen in our clinic, the patient reports worsening peripheral edema.  She had been taking lasix, and it was stopped. She developed severe edema and restarted low dose lasix. Her swelling improved slightly. She denies dietary indiscretion. She has been taking amlodipine and has not worn any support stockings.  The patient denies symptoms of fevers, chills, cough, or new SOB worrisome for COVID 19.  Past Medical History:  Diagnosis Date  . Aphasia 01/09/2009  . Arthritis    right hand  . 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 CEREBROVASCULAR ACCIDENT, HX OF 11/20/2007  . CHF (congestive heart failure) ([place])   . Coronary artery disease   . CVA 01/09/2009  . DEMENTIA 05/19/2008  . Dementia ([place])   . DIVERTICULOSIS, COLON 11/20/2007  . FATIGUE 11/20/2007  . Femoral fracture ([place]) 07/01/2013  . GLUCOSE INTOLERANCE 12/06/2010  . HYPERLIPIDEMIA 11/20/2007  . HYPERTENSION 11/20/2007   Echo was done 07/07/11: mod LVH, EF 55-60%, grade 1 diast dysfxn, mild MR, mild LAE, mod TR, PASP 39  . HYPOTHYROIDISM 11/20/2007  . Impaired glucose tolerance 06/03/2011  . IRON DEFICIENCY 12/06/2010  . Pacemaker 2002  .  PACEMAKER, PERMANENT 01/09/2009  . TIA 05/14/2003   elevated CE's in setting of TIA and falls in 10/12 - echo normal with no further cardiac w/u pursued  . TIA (transient ischemic attack) 10/10/2011  . Urinary incontinence 01/09/2009    Past Surgical History:  Procedure Laterality Date  . ABDOMINAL HYSTERECTOMY    . CHOLECYSTECTOMY    . CYSTOCELE REPAIR N/A 11/26/2012   Procedure: Cystocele Repair with Lefort Colpocleisis.  colpectomy Levatorplasty Perineorraphy;  Surgeon: Lovenia Kim, MD;  Location: [facility];  Service: Gynecology;  Laterality: N/A;  Cystocele Repair with Lefort Colpocleisis.  Marland Kitchen HIP ARTHROPLASTY Left 07/02/2013   Procedure: ARTHROPLASTY BIPOLAR HIP;  Surgeon: Mauri Pole, MD;  Location: [facility];  Service: Orthopedics;  Laterality: Left;  . INGUINAL HERNIA REPAIR Right 08/13/2013   Procedure: HERNIA REPAIR INGUINAL INCARCERATED;  Surgeon: Ralene Ok, MD;  Location: [facility];  Service: General;  Laterality: Right;  . Medtronic Cynthia dual chamber pacemaker serial number was [id number] H...04/15/2009    . PPM GENERATOR CHANGEOUT N/A 04/15/2019   Procedure: PPM GENERATOR CHANGEOUT;  Surgeon: Evans Lance, MD;  Location: [facility];  Service: Cardiovascular;  Laterality: N/A;  . s/p cerebral aneurysm  1991  . s/p pacemaker DDD    . s/p retinal attachment    . s/p thyroidectomy      Current Outpatient Medications  Medication Sig Dispense Refill  . ALPRAZolam (XANAX) 0.25 MG tablet Take 0.25 mg by mouth daily as needed for anxiety.    Marland Kitchen  amLODipine (NORVASC) 10 MG tablet TAKE 1 TABLET BY MOUTH DAILY. RESUME IN 3 DAYS IS SYSTOLIC BLOOD PRESSURE IS GREATER THAN 150. (Patient taking differently: Take 10 mg by mouth daily. ) 90 tablet 1  . apixaban (ELIQUIS) 5 MG TABS tablet Take 1 tablet (5 mg total) by mouth 2 (two) times daily. 60 tablet 2  . Calcium Carbonate-Vitamin D (CALTRATE 600+D) 600-400 MG-UNIT per tablet Take 1 tablet by mouth at bedtime.     . docusate sodium  (COLACE) 100 MG capsule Take 100 mg by mouth daily.    Marland Kitchen donepezil (ARICEPT) 10 MG tablet TAKE 1 TABLET(10 MG) BY MOUTH DAILY 90 tablet 1  . levothyroxine (SYNTHROID) 125 MCG tablet TAKE 1 TABLET(125 MCG) BY MOUTH DAILY (Patient taking differently: Take 125 mcg by mouth daily before breakfast. ) 90 tablet 1  . Multiple Vitamins-Minerals (CENTRUM SILVER 50+WOMEN) TABS Take 1 tablet by mouth daily.    . pantoprazole (PROTONIX) 40 MG tablet Take 1 tablet (40 mg total) by mouth daily. 90 tablet 1  . polyvinyl alcohol (LIQUIFILM TEARS) 1.4 % ophthalmic solution Place 1 drop into both eyes 3 (three) times daily.    . pravastatin (PRAVACHOL) 10 MG tablet Take 1 tablet (10 mg total) by mouth at bedtime. 90 tablet 1  . valsartan (DIOVAN) 80 MG tablet Take 1 tablet (80 mg total) by mouth daily. Take 80 mg by mouth daily. (Patient taking differently: Take 80 mg by mouth daily. ) 90 tablet 1  . furosemide (LASIX) 20 MG tablet Take 1 tablet (20 mg total) by mouth daily. 90 tablet 3   No current facility-administered medications for this visit.     Allergies:   Codeine   Social History:  The patient  reports that she has never smoked. She has never used smokeless tobacco. She reports that she does not drink alcohol or use drugs.   Family History:  The patient's  family history includes Cancer in her mother.   ROS:  Please see the history of present illness.   All other systems are personally reviewed and negative.    Exam:    Vital Signs:  Ht 5\' 5"  (1.651 m)   Wt 150 lb (68 kg)   BMI 24.96 kg/m   Well appearing, alert and conversant, regular work of breathing,  good skin color Eyes- anicteric, neuro- grossly intact, skin- no apparent rash or lesions or cyanosis, mouth- oral mucosa is pink Ext. - 3+ peripheral edema   Labs/Other Tests and Data Reviewed:    Recent Labs: 02/20/2019: TSH 0.754 02/22/2019: ALT 18 04/11/2019: BUN 17; Creatinine, Ser 1.05; Potassium 4.0; Sodium 137 06/25/2019:  Hemoglobin 14.5; Platelets 315   Wt Readings from Last 3 Encounters:  08/29/19 150 lb (68 kg)  06/25/19 145 lb (65.8 kg)  05/23/19 141 lb (64 kg)     Other studies personally reviewed:  Last device remote is reviewed from [facility] PDF dated 8/4 which reveals normal device function, no arrhythmias except for chronic atrial fib.   ASSESSMENT & PLAN:    1.  CHB - she is asymptomatic, s/p PPM insertion. 2. PPM - her device is working normally based on her last interrogation. 3. Diastolic heart failure/venous insufficiency - she will need to stop amlodipine and increase lasix to 40 mg daily, and if bp goes up, she will need to start coreg 3.125 bid. Because we will need to check her weights, and BMP and meds I will have a home health visit for her in the  next couple of weeks due to her advanced age and prior stroke. 4. Coags - she has had no bleeding or falls on Eliquis. 5. COVID 19 screen The patient denies symptoms of COVID 19 at this time.  The importance of social distancing was discussed today.  Follow-up:  6 months Next remote: next month  Current medicines are reviewed at length with the patient today.   The patient does not have concerns regarding her medicines.  The following changes were made today:  none  Labs/ tests ordered today include: none No orders of the defined types were placed in this encounter.    Patient Risk:  after full review of this patients clinical status, I feel that they are at moderate risk at this time.  Today, I have spent 15 minutes with the patient with telehealth technology discussing all of the above .    Signed, [name], MD  08/29/2019 1:29 PM     [address] [place] [address] [phone] (office) [phone] (fax)

## 2019-08-29 NOTE — Telephone Encounter (Signed)
THIS IS AN ADDENDUM: THE ADDRESS ON FILE IS HER DAUGHTER/SON HOME ADDRESS. PT RESIDES AT [id number], [place], [place] [address]

## 2019-08-29 NOTE — Addendum Note (Signed)
Addended by: Willeen Cass A on: 08/29/2019 03:57 PM   Modules accepted: Orders

## 2019-09-03 ENCOUNTER — Encounter: Payer: Self-pay | Admitting: Internal Medicine

## 2019-09-03 MED ORDER — APIXABAN 5 MG PO TABS: 5.0000 mg | ORAL_TABLET | Freq: Two times a day (BID) | ORAL | 2 refills | Status: DC

## 2019-09-03 MED ORDER — DONEPEZIL HCL 10 MG PO TABS: ORAL_TABLET | ORAL | 1 refills | Status: DC

## 2019-09-03 MED ORDER — LEVOTHYROXINE SODIUM 125 MCG PO TABS: ORAL_TABLET | ORAL | 1 refills | Status: AC

## 2019-09-03 MED ORDER — VALSARTAN 80 MG PO TABS: 80.0000 mg | ORAL_TABLET | Freq: Every day | ORAL | 1 refills | Status: AC

## 2019-09-03 MED ORDER — PANTOPRAZOLE SODIUM 40 MG PO TBEC: 40.0000 mg | DELAYED_RELEASE_TABLET | Freq: Every day | ORAL | 1 refills | Status: AC

## 2019-09-03 MED ORDER — APIXABAN 5 MG PO TABS: 5.0000 mg | ORAL_TABLET | Freq: Two times a day (BID) | ORAL | 11 refills | Status: AC

## 2019-09-03 MED ORDER — PRAVASTATIN SODIUM 10 MG PO TABS: 10.0000 mg | ORAL_TABLET | Freq: Every day | ORAL | 1 refills | Status: AC

## 2019-09-05 ENCOUNTER — Other Ambulatory Visit: Payer: Self-pay

## 2019-09-05 ENCOUNTER — Encounter: Payer: Self-pay | Admitting: Internal Medicine

## 2019-09-05 ENCOUNTER — Encounter (HOSPITAL_COMMUNITY): Payer: Self-pay

## 2019-09-05 ENCOUNTER — Emergency Department (HOSPITAL_COMMUNITY): Payer: Medicare Other

## 2019-09-05 ENCOUNTER — Emergency Department (HOSPITAL_COMMUNITY)
Admission: EM | Admit: 2019-09-05 | Discharge: 2019-09-05 | Disposition: A | Discharge status: Home / Self Care | Payer: Medicare Other | Attending: Emergency Medicine | Admitting: Emergency Medicine

## 2019-09-05 DIAGNOSIS — I509 Heart failure, unspecified: Secondary | ICD-10-CM | POA: Insufficient documentation

## 2019-09-05 DIAGNOSIS — I110 Hypertensive heart disease with heart failure: Secondary | ICD-10-CM | POA: Diagnosis not present

## 2019-09-05 DIAGNOSIS — Z950 Presence of cardiac pacemaker: Secondary | ICD-10-CM | POA: Insufficient documentation

## 2019-09-05 DIAGNOSIS — K625 Hemorrhage of anus and rectum: Secondary | ICD-10-CM

## 2019-09-05 DIAGNOSIS — Z7901 Long term (current) use of anticoagulants: Secondary | ICD-10-CM | POA: Insufficient documentation

## 2019-09-05 DIAGNOSIS — Z79899 Other long term (current) drug therapy: Secondary | ICD-10-CM | POA: Insufficient documentation

## 2019-09-05 DIAGNOSIS — F0390 Unspecified dementia without behavioral disturbance: Secondary | ICD-10-CM | POA: Diagnosis not present

## 2019-09-05 DIAGNOSIS — K644 Residual hemorrhoidal skin tags: Secondary | ICD-10-CM | POA: Diagnosis not present

## 2019-09-05 DIAGNOSIS — E039 Hypothyroidism, unspecified: Secondary | ICD-10-CM | POA: Diagnosis not present

## 2019-09-05 DIAGNOSIS — R1084 Generalized abdominal pain: Secondary | ICD-10-CM | POA: Insufficient documentation

## 2019-09-05 DIAGNOSIS — K648 Other hemorrhoids: Secondary | ICD-10-CM | POA: Insufficient documentation

## 2019-09-05 LAB — CBC
HCT: 43 % (ref 36.0–46.0)
Hemoglobin: 14.1 g/dL (ref 12.0–15.0)
MCH: 30.7 pg (ref 26.0–34.0)
MCHC: 32.8 g/dL (ref 30.0–36.0)
MCV: 93.5 fL (ref 80.0–100.0)
Platelets: 277 10*3/uL (ref 150–400)
RBC: 4.6 MIL/uL (ref 3.87–5.11)
RDW: 12.6 % (ref 11.5–15.5)
WBC: 7.8 10*3/uL (ref 4.0–10.5)
nRBC: 0 % (ref 0.0–0.2)

## 2019-09-05 LAB — COMPREHENSIVE METABOLIC PANEL
ALT: 14 U/L (ref 0–44)
AST: 20 U/L (ref 15–41)
Albumin: 3.7 g/dL (ref 3.5–5.0)
Alkaline Phosphatase: 72 U/L (ref 38–126)
Anion gap: 11 (ref 5–15)
BUN: 23 mg/dL (ref 8–23)
CO2: 27 mmol/L (ref 22–32)
Calcium: 8.5 mg/dL — ABNORMAL LOW (ref 8.9–10.3)
Chloride: 98 mmol/L (ref 98–111)
Creatinine, Ser: 1.23 mg/dL — ABNORMAL HIGH (ref 0.44–1.00)
GFR calc Af Amer: 43 mL/min — ABNORMAL LOW (ref >60)
GFR calc non Af Amer: 37 mL/min — ABNORMAL LOW (ref >60)
Glucose, Bld: 164 mg/dL — ABNORMAL HIGH (ref 70–99)
Potassium: 3.2 mmol/L — ABNORMAL LOW (ref 3.5–5.1)
Sodium: 136 mmol/L (ref 135–145)
Total Bilirubin: 1.1 mg/dL (ref 0.3–1.2)
Total Protein: 6.7 g/dL (ref 6.5–8.1)

## 2019-09-05 LAB — TYPE AND SCREEN
ABO/RH(D): A POS
Antibody Screen: NEGATIVE

## 2019-09-05 MED ORDER — SODIUM CHLORIDE 0.9 % IV SOLN: INTRAVENOUS | Status: DC

## 2019-09-05 MED ORDER — IOHEXOL 300 MG/ML  SOLN
100.0000 mL | Freq: Once | INTRAMUSCULAR | Status: AC | PRN
  Administered 2019-09-05: 80 mL via INTRAVENOUS

## 2019-09-05 MED ORDER — SODIUM CHLORIDE (PF) 0.9 % IJ SOLN
INTRAMUSCULAR | Status: AC
  Filled 2019-09-05: qty 50

## 2019-09-05 MED ORDER — POTASSIUM CHLORIDE CRYS ER 20 MEQ PO TBCR
40.0000 meq | EXTENDED_RELEASE_TABLET | Freq: Once | ORAL | Status: AC
  Administered 2019-09-05: 40 meq via ORAL
  Filled 2019-09-05: qty 2

## 2019-09-05 NOTE — ED Notes (Signed)
An After Visit Summary was printed and given to the patient. Discharge instructions given and no further questions at this time. Pt leaving POV with daughter-in-law, Wells Guiles.

## 2019-09-05 NOTE — ED Triage Notes (Signed)
Patient is a resident of [place]. Patient 's daughter-in-law was notified that the patient had rectal bleeding today. Patient had maroon and bright red bleeding in her brief today. Patient is on Eliquis. Patient has a history of dementia and strokes and is unable to state if in pain.

## 2019-09-05 NOTE — Discharge Instructions (Addendum)
Some inflammation was seen in the colon which will need to be evaluated by colonoscopy.  I have given you referral to the gastroenterologist on call.  External hemorrhoids were present and these are the likely cause of her symptoms.  She also has a large amount of stool in her rectum which may require an enema.  I would recommend stool softeners to be continued at this time with may be a laxative.  Your potassium was slightly low and you were given oral potassium.  Return here for any further bleeding.

## 2019-09-05 NOTE — Telephone Encounter (Signed)
Ok to let family know - pt should go to ED for further evaluation for ongoing lower GI bleeding

## 2019-09-05 NOTE — ED Provider Notes (Signed)
[name]   CSN: [id number] Arrival date & time: 09/05/19  1406     History Chief Complaint  Patient presents with  . Rectal Bleeding    [name] is a 85 y.o. female.  85 year old female presents rectal bleeding which began yesterday.  Patient does have a history of hemorrhoids.  She is also taking Eliquis.  She has baseline dementia and has had no acute complaints at this time.  Patient's daughter-in-law showed me a picture of her depends from last night which did have some bright red blood.  She showed me another picture of her depends from today which is completely normal.  No prior history of GI bleeding.  History of diverticulosis.        Past Medical History:  Diagnosis Date  . Aphasia 01/09/2009  . Arthritis    right hand  . 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 CEREBROVASCULAR ACCIDENT, HX OF 11/20/2007  . CHF (congestive heart failure) ([place])   . Coronary artery disease   . CVA 01/09/2009  . DEMENTIA 05/19/2008  . Dementia ([place])   . DIVERTICULOSIS, COLON 11/20/2007  . FATIGUE 11/20/2007  . Femoral fracture ([place]) 07/01/2013  . GLUCOSE INTOLERANCE 12/06/2010  . HYPERLIPIDEMIA 11/20/2007  . HYPERTENSION 11/20/2007   Echo was done 07/07/11: mod LVH, EF 55-60%, grade 1 diast dysfxn, mild MR, mild LAE, mod TR, PASP 39  . HYPOTHYROIDISM 11/20/2007  . Impaired glucose tolerance 06/03/2011  . IRON DEFICIENCY 12/06/2010  . Pacemaker 2002  . PACEMAKER, PERMANENT 01/09/2009  . TIA 05/14/2003   elevated CE's in setting of TIA and falls in 10/12 - echo normal with no further cardiac w/u pursued  . TIA (transient ischemic attack) 10/10/2011  . Urinary incontinence 01/09/2009    Patient Active Problem List   Diagnosis Date Noted  . Bloody diarrhea 06/25/2019  . Pressure ulcer of sacral region, stage 2 ([place]) 02/21/2019  . Hypothermia 02/20/2019  . Dysphagia, post-stroke   . Global aphasia   . Acute right ACA stroke ([place])  12/07/2018  . Stroke (cerebrum) ([place]) 12/03/2018  . Cough 10/11/2018  . Abnormal breath sounds 10/11/2018  . Closed nondisplaced fracture of proximal phalanx of lesser toe of right foot 06/26/2018  . Right ankle pain 06/21/2018  . Right foot pain 06/21/2018  . Abnormal CXR 04/26/2017  . Lobar pneumonia ([place]) 01/20/2017  . Acute on chronic respiratory failure with hypoxia ([place]) 01/17/2017  . Acute respiratory failure with hypoxia ([place]) 01/17/2017  . DOE (dyspnea on exertion) 10/23/2014  . Hyponatremia 10/23/2014  . RLL pneumonia 10/11/2013  . Other specified anemias 10/10/2013  . Acute respiratory failure ([place]) 08/15/2013  . Aspiration pneumonia ([place]) 08/15/2013  . Altered mental status 08/15/2013  . Incarcerated femoral hernia 08/14/2013  . Esophageal reflux 08/05/2013  . Unspecified constipation 07/03/2013  . Closed fracture of left hip ([place]) 07/01/2013  . 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 Atrial fibrillation ([place]) 10/30/2012  . Low back pain 10/26/2012  . Syncope 10/26/2012  . PNA (pneumonia) 09/26/2012  . Acute lower UTI 09/26/2012  . Hypokalemia 09/26/2012  . Leukocytosis 09/26/2012  . Cardiac enzymes elevated 07/25/2011  . Impaired glucose tolerance 06/03/2011  . Preventative health care 06/03/2011  . IRON DEFICIENCY 12/06/2010  . 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 CVA (cerebral vascular accident) ([place]) 01/09/2009  . Aphasia 01/09/2009  . Urinary incontinence 01/09/2009  . PACEMAKER, PERMANENT 01/09/2009  . Dementia ([place]) 05/19/2008  . Hypothyroidism 11/20/2007  .  Hyperlipidemia 11/20/2007  . Essential hypertension 11/20/2007  . DIVERTICULOSIS, COLON 11/20/2007  . FATIGUE 11/20/2007  . CEREBROVASCULAR ACCIDENT, HX OF 11/20/2007  . TIA 05/14/2003    Past Surgical History:  Procedure Laterality Date  . ABDOMINAL HYSTERECTOMY    . CHOLECYSTECTOMY    . CYSTOCELE REPAIR N/A 11/26/2012   Procedure: Cystocele Repair with Lefort Colpocleisis.   colpectomy Levatorplasty Perineorraphy;  Surgeon: Lovenia Kim, MD;  Location: [facility];  Service: Gynecology;  Laterality: N/A;  Cystocele Repair with Lefort Colpocleisis.  Marland Kitchen HIP ARTHROPLASTY Left 07/02/2013   Procedure: ARTHROPLASTY BIPOLAR HIP;  Surgeon: Mauri Pole, MD;  Location: [facility];  Service: Orthopedics;  Laterality: Left;  . INGUINAL HERNIA REPAIR Right 08/13/2013   Procedure: HERNIA REPAIR INGUINAL INCARCERATED;  Surgeon: Ralene Ok, MD;  Location: [facility];  Service: General;  Laterality: Right;  . Medtronic Cynthia dual chamber pacemaker serial number was [id number] H...04/15/2009    . PPM GENERATOR CHANGEOUT N/A 04/15/2019   Procedure: PPM GENERATOR CHANGEOUT;  Surgeon: Evans Lance, MD;  Location: [facility];  Service: Cardiovascular;  Laterality: N/A;  . s/p cerebral aneurysm  1991  . s/p pacemaker DDD    . s/p retinal attachment    . s/p thyroidectomy       OB History   No obstetric history on file.     Family History  Problem Relation Age of Onset  . Cancer Mother        breast cancer    Social History   Tobacco Use  . Smoking status: Never Smoker  . Smokeless tobacco: Never Used  Substance Use Topics  . Alcohol use: No  . Drug use: No    Home Medications Prior to Admission medications   Medication Sig Start Date End Date Taking? Authorizing Provider  ALPRAZolam Duanne Moron) 0.25 MG tablet Take 0.25 mg by mouth daily as needed for anxiety.    [provider]  apixaban (ELIQUIS) 5 MG TABS tablet Take 1 tablet (5 mg total) by mouth 2 (two) times daily. 09/03/19   Evans Lance, MD  apixaban (ELIQUIS) 5 MG TABS tablet Take 1 tablet (5 mg total) by mouth 2 (two) times daily. 09/03/19   Biagio Borg, MD  Calcium Carbonate-Vitamin D (CALTRATE 600+D) 600-400 MG-UNIT per tablet Take 1 tablet by mouth at bedtime.     [provider]  docusate sodium (COLACE) 100 MG capsule Take 100 mg by mouth daily.    [provider]    donepezil (ARICEPT) 10 MG tablet TAKE 1 TABLET(10 MG) BY MOUTH DAILY 09/03/19   Biagio Borg, MD  furosemide (LASIX) 40 MG tablet Take 1 tablet (40 mg total) by mouth daily. 08/29/19 11/27/19  Evans Lance, MD  levothyroxine (SYNTHROID) 125 MCG tablet TAKE 1 TABLET(125 MCG) BY MOUTH DAILY 09/03/19   Biagio Borg, MD  Multiple Vitamins-Minerals (CENTRUM SILVER 50+WOMEN) TABS Take 1 tablet by mouth daily.    [provider]  pantoprazole (PROTONIX) 40 MG tablet Take 1 tablet (40 mg total) by mouth daily. 09/03/19   Biagio Borg, MD  polyvinyl alcohol (LIQUIFILM TEARS) 1.4 % ophthalmic solution Place 1 drop into both eyes 3 (three) times daily.    [provider]  pravastatin (PRAVACHOL) 10 MG tablet Take 1 tablet (10 mg total) by mouth at bedtime. 09/03/19   Biagio Borg, MD  valsartan (DIOVAN) 80 MG tablet Take 1 tablet (80 mg total) by mouth daily. Take 80 mg by mouth  daily. 09/03/19   Biagio Borg, MD    Allergies    Codeine  Review of Systems   Review of Systems  All other systems reviewed and are negative.   Physical Exam Updated Vital Signs BP (!) 118/55 (BP Location: Left Arm)   Pulse 65   Temp 97.9 F (36.6 C) (Oral)   Resp 14   Ht 1.651 m (5\' 5" )   Wt 68 kg   SpO2 94%   BMI 24.96 kg/m   Physical Exam Vitals and nursing [name] reviewed.  Constitutional:      General: She is not in acute distress.    Appearance: Normal appearance. She is well-developed. She is not toxic-appearing.  HENT:     Head: Normocephalic and atraumatic.  Eyes:     General: Lids are normal.     Conjunctiva/sclera: Conjunctivae normal.     Pupils: Pupils are equal, round, and reactive to light.  Neck:     Thyroid: No thyroid mass.     Trachea: No tracheal deviation.  Cardiovascular:     Rate and Rhythm: Normal rate and regular rhythm.     Heart sounds: Normal heart sounds. No murmur. No gallop.   Pulmonary:     Effort: Pulmonary effort is normal. No respiratory distress.      Breath sounds: Normal breath sounds. No stridor. No decreased breath sounds, wheezing, rhonchi or rales.  Abdominal:     General: Bowel sounds are normal. There is no distension.     Palpations: Abdomen is soft.     Tenderness: There is no abdominal tenderness. There is no rebound.  Genitourinary:    Rectum: External hemorrhoid and internal hemorrhoid present.  Musculoskeletal:        General: No tenderness. Normal range of motion.     Cervical back: Normal range of motion and neck supple.  Skin:    General: Skin is warm and dry.     Findings: No abrasion or rash.  Neurological:     General: No focal deficit present.     Mental Status: She is alert. Mental status is at baseline.     GCS: GCS eye subscore is 4. GCS verbal subscore is 5. GCS motor subscore is 6.     Cranial Nerves: No cranial nerve deficit.     Sensory: No sensory deficit.  Psychiatric:        Attention and Perception: Attention normal.     ED Results / Procedures / Treatments   Labs (all labs ordered are listed, but only abnormal results are displayed) Labs Reviewed  COMPREHENSIVE METABOLIC PANEL - Abnormal; Notable for the following components:      Result Value   Potassium 3.2 (*)    Glucose, Bld 164 (*)    Creatinine, Ser 1.23 (*)    Calcium 8.5 (*)    GFR calc non Af Amer 37 (*)    GFR calc Af Amer 43 (*)    All other components within normal limits  CBC  POC OCCULT BLOOD, ED  TYPE AND SCREEN    EKG None  Radiology No results found.  Procedures Procedures (including critical care time)  Medications Ordered in ED Medications - No data to display  ED Course  I have reviewed the triage vital signs and the nursing notes.  Pertinent labs & imaging results that were available during my care of the patient were reviewed by me and considered in my medical decision making (see chart for details).    MDM  Rules/Calculators/A&P     CHA2DS2/VAS Stroke Risk Points  Current as of about an hour ago      7 >= 2 Points: High Risk  1 - 1.99 Points: Medium Risk  0 Points: Low Risk    The patient's score has not changed in the past year.: No Change     Details    This score determines the patient's risk of having a stroke if the  patient has atrial fibrillation.       Points Metrics  1 Has Congestive Heart Failure:  Yes    Current as of about an hour ago  0 Has Vascular Disease:  No    Current as of about an hour ago  1 Has Hypertension:  Yes    Current as of about an hour ago  2 Age:  56    Current as of about an hour ago  0 Has Diabetes:  No    Current as of about an hour ago  2 Had Stroke:  Yes  Had TIA:  No  Had thromboembolism:  No    Current as of about an hour ago  1 Female:  Yes    Current as of about an hour ago                        Patient with external hemorrhoids as well as some internal hemorrhoids to palpation.  Suspect this is because of her bleeding.  She has had 2 normal stools since then which of been brown and nonbloody. Patient's hemoglobin is stable here.  Has mild hypokalemia that was treated with oral potassium.  White count normal.  Abdominal CT with questionable diverticular disease.  Recommend GI follow-up with colonoscopy.  Will refer to GI on-call.  Return precautions given Final Clinical Impression(s) / ED Diagnoses Final diagnoses:  None    Rx / DC Orders ED Discharge Orders    None       Lacretia Leigh, MD 09/05/19 [phone]

## 2019-09-05 NOTE — ED Notes (Signed)
Dr. Zenia Resides bedside performing POC occult blood.

## 2019-09-05 NOTE — Telephone Encounter (Signed)
Rebecca has been informed

## 2019-09-10 ENCOUNTER — Ambulatory Visit (INDEPENDENT_AMBULATORY_CARE_PROVIDER_SITE_OTHER): Payer: Medicare Other | Admitting: Gastroenterology

## 2019-09-10 ENCOUNTER — Encounter: Payer: Self-pay | Admitting: Gastroenterology

## 2019-09-10 ENCOUNTER — Other Ambulatory Visit: Payer: Self-pay

## 2019-09-10 VITALS — BP 100/72 | HR 68 | Temp 96.7°F

## 2019-09-10 DIAGNOSIS — K625 Hemorrhage of anus and rectum: Secondary | ICD-10-CM | POA: Diagnosis not present

## 2019-09-10 DIAGNOSIS — R933 Abnormal findings on diagnostic imaging of other parts of digestive tract: Secondary | ICD-10-CM

## 2019-09-10 NOTE — Patient Instructions (Signed)
I am recommending an regimen today to try to remove the rectal stool ball.  You should take dulcolax 20 mg (4 pills) once and then have 6 doses of Miralax 17grams today. This will cause severe diarrhea. You may also see some bleeding with all of these bowel movements today.  Starting tomorrow we will try to prevent the rectal stool ball from reforming using Miralax 17 grams daily in addition to Colace. You need to drink at least 64 ounces of water every day. A high fiber diet would also be helpful. Even adding a stool bulking agent such as Metamucil can make this less likely to recur without causes diarrhea.

## 2019-09-10 NOTE — Progress Notes (Signed)
Referring Provider: Biagio Borg, MD Primary Care Physician:  Biagio Borg, MD  Reason for Consultation: Rectal bleeding   IMPRESSION:  Recurrent rectal bleeding Fecal impaction Nonbleeding internal and external hemorrhoids on rectal exam today Abnormal CT scan: rectal stool ball with associated rectal thickening and segmental thickening of the distal sigmoid colon Intra and extra biliary dilatation with a common bile duct measuring up to 14 mm On Eliquis for atrial fibrillation and secondary stroke prevention Sigmoid diverticulosis on colonoscopy 1993  Recurrent rectal bleeding is likely due to stercoral ulcer given her rectal exam today and my personal review of her recent CT scan. Will plan purgative today and then an aggressive bowel regimen to try to prevent recurrence.  Will asked Dr. Jenny Reichmann to reconsider risks and benefits of long-term Eliquis given her recurrent bleeding.  No endoscopic evaluation planned at this time however would consider flexible sigmoidoscopy to evaluate for underlying malignancy if bleeding persists despite improvement with defecation/resolution of fecal impaction.  Her intra and extra biliary dilatation is likely related to prior cholecystectomy.  Liver enzymes are normal.  No further evaluation indicated at this time.  PLAN: Bowel purgative today: dulcolax 20 mg x 1 then 6 doses of Miralax 17 g  Add tapwater enemas if no response to MiraLAX today Then, Metamucil 1 tbsp daily and  Miralax 17 grams daily to insure one adequate bowel movement Drink at least 64 ounces of water daily Encourage ambulation/movement as she is able Review indications for ongoing Eliquis use with Dr. Jenny Reichmann  Please see the "Patient Instructions" section for addition details about the plan.  HPI: [name] is a 85 y.o. female who lives at [facility]. She is accompanied her daughter-in-law who provides most of the history and notes that the patient does not complain  about anything even when symptoms are present.  She is a retired Radiation protection practitioner.  The history is also obtained through review of the electronic health record.   The patient has dementia, permanent pacemaker for complete AV block, prior strokes most recently in March 2020 with residual right hemiparesis and aphasia, cerebral aneurysm clipping in 1993, hypothyroidism, hypertension, hyperlipidemia, glucose intolerance, coronary artery disease, and congestive heart failure.  She had incarcerated inguinal hernia in 2014.  She is on Eliquis for atrial fibrillation and secondary stroke prevention.   She was seen in the ED in September 2020 and again 09/05/2019 for rectal bleeding.  Internal and external hemorrhoids identified.  CBC was normal including her hemoglobin of 14.1.  Liver enzymes were normal.    CT of the abdomen and pelvis 09/05/2019 with contrast showed a large inspissated stool ball in the rectum with associated rectal thickening and presacral stranding and edema, segmental thickening of the distal sigmoid colon and an area of diverticular ecchymosis and prior cholecystectomy with intra and extrahepatic biliary dilatation.  The common bile duct measures up to 14 mm.  There is diffuse pancreatic atrophy.  She has assistants who help her go the bathroom every day. Assistants note more diarrhea that formed BM daily.  There is no known history of constipation.  No prior history of GI bleeding.  No reports of rectal pain or abdominal pain.  EGD with Dr. Earlean Shawl in 1993 revealed a small nodule at the [place] junction and a small hiatal hernia. Colonoscopy with Dr. Fuller Plan in 1993 revealed sigmoid diverticulosis and internal hemorrhoids.  No known family history of colon cancer or polyps. No family history of uterine/endometrial cancer, pancreatic cancer or gastric/stomach  cancer.   Past Medical History:  Diagnosis Date  . Aphasia 01/09/2009  . Arthritis    right hand  . 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  CEREBROVASCULAR ACCIDENT, HX OF 11/20/2007  . CHF (congestive heart failure) ([place])   . Coronary artery disease   . CVA 01/09/2009  . DEMENTIA 05/19/2008  . Dementia ([place])   . DIVERTICULOSIS, COLON 11/20/2007  . FATIGUE 11/20/2007  . Femoral fracture ([place]) 07/01/2013  . GLUCOSE INTOLERANCE 12/06/2010  . HYPERLIPIDEMIA 11/20/2007  . HYPERTENSION 11/20/2007   Echo was done 07/07/11: mod LVH, EF 55-60%, grade 1 diast dysfxn, mild MR, mild LAE, mod TR, PASP 39  . HYPOTHYROIDISM 11/20/2007  . Impaired glucose tolerance 06/03/2011  . IRON DEFICIENCY 12/06/2010  . Pacemaker 2002  . PACEMAKER, PERMANENT 01/09/2009  . TIA 05/14/2003   elevated CE's in setting of TIA and falls in 10/12 - echo normal with no further cardiac w/u pursued  . TIA (transient ischemic attack) 10/10/2011  . Urinary incontinence 01/09/2009    Past Surgical History:  Procedure Laterality Date  . ABDOMINAL HYSTERECTOMY    . CHOLECYSTECTOMY    . CYSTOCELE REPAIR N/A 11/26/2012   Procedure: Cystocele Repair with Lefort Colpocleisis.  colpectomy Levatorplasty Perineorraphy;  Surgeon: Lovenia Kim, MD;  Location: [facility];  Service: Gynecology;  Laterality: N/A;  Cystocele Repair with Lefort Colpocleisis.  Marland Kitchen HIP ARTHROPLASTY Left 07/02/2013   Procedure: ARTHROPLASTY BIPOLAR HIP;  Surgeon: Mauri Pole, MD;  Location: [facility];  Service: Orthopedics;  Laterality: Left;  . INGUINAL HERNIA REPAIR Right 08/13/2013   Procedure: HERNIA REPAIR INGUINAL INCARCERATED;  Surgeon: Ralene Ok, MD;  Location: [facility];  Service: General;  Laterality: Right;  . Medtronic Cynthia dual chamber pacemaker serial number was [id number] H...04/15/2009    . PPM GENERATOR CHANGEOUT N/A 04/15/2019   Procedure: PPM GENERATOR CHANGEOUT;  Surgeon: Evans Lance, MD;  Location: [facility];  Service: Cardiovascular;  Laterality: N/A;  . s/p cerebral aneurysm  1991  . s/p pacemaker DDD    . s/p retinal attachment    . s/p thyroidectomy      Current  Outpatient Medications  Medication Sig Dispense Refill  . ALPRAZolam (XANAX) 0.25 MG tablet Take 0.25 mg by mouth daily as needed for anxiety.    Marland Kitchen apixaban (ELIQUIS) 5 MG TABS tablet Take 1 tablet (5 mg total) by mouth 2 (two) times daily. (Patient not taking: Reported on 09/05/2019) 60 tablet 11  . apixaban (ELIQUIS) 5 MG TABS tablet Take 1 tablet (5 mg total) by mouth 2 (two) times daily. 60 tablet 2  . Calcium Carbonate-Vitamin D (CALTRATE 600+D) 600-400 MG-UNIT per tablet Take 1 tablet by mouth at bedtime.     . docusate sodium (COLACE) 100 MG capsule Take 100 mg by mouth daily.    Marland Kitchen donepezil (ARICEPT) 10 MG tablet TAKE 1 TABLET(10 MG) BY MOUTH DAILY 90 tablet 1  . furosemide (LASIX) 40 MG tablet Take 1 tablet (40 mg total) by mouth daily. 90 tablet 3  . levothyroxine (SYNTHROID) 125 MCG tablet TAKE 1 TABLET(125 MCG) BY MOUTH DAILY 90 tablet 1  . Multiple Vitamins-Minerals (CENTRUM SILVER 50+WOMEN) TABS Take 1 tablet by mouth daily.    . pantoprazole (PROTONIX) 40 MG tablet Take 1 tablet (40 mg total) by mouth daily. 90 tablet 1  . polyvinyl alcohol (LIQUIFILM TEARS) 1.4 % ophthalmic solution Place 1 drop into both eyes 3 (three) times daily.    . pravastatin (PRAVACHOL) 10 MG tablet Take  1 tablet (10 mg total) by mouth at bedtime. 90 tablet 1  . valsartan (DIOVAN) 80 MG tablet Take 1 tablet (80 mg total) by mouth daily. Take 80 mg by mouth daily. 90 tablet 1   No current facility-administered medications for this visit.    Allergies as of 09/10/2019 - Review Complete 09/05/2019  Allergen Reaction Noted  . Codeine Rash 11/20/2007    Family History  Problem Relation Age of Onset  . Cancer Mother        breast cancer    Social History   Socioeconomic History  . Marital status: Divorced    Spouse name: Not on file  . Number of children: Not on file  . Years of education: Not on file  . Highest education level: Not on file  Occupational History  . Occupation: retired  Radiation protection practitioner, lives at [facility]  . Smoking status: Never Smoker  . Smokeless tobacco: Never Used  Substance and Sexual Activity  . Alcohol use: No  . Drug use: No  . Sexual activity: Not on file  Other Topics Concern  . Not on file  Social History Narrative  . Not on file   Social Determinants of Health   Financial Resource Strain:   . Difficulty of Paying Living Expenses: Not on file  Food Insecurity:   . Worried About Charity fundraiser in the Last Year: Not on file  . Ran Out of Food in the Last Year: Not on file  Transportation Needs:   . Lack of Transportation (Medical): Not on file  . Lack of Transportation (Non-Medical): Not on file  Physical Activity:   . Days of Exercise per Week: Not on file  . Minutes of Exercise per Session: Not on file  Stress:   . Feeling of Stress : Not on file  Social Connections:   . Frequency of Communication with Friends and Family: Not on file  . Frequency of Social Gatherings with Friends and Family: Not on file  . Attends Religious Services: Not on file  . Active Member of Clubs or Organizations: Not on file  . Attends Archivist Meetings: Not on file  . Marital Status: Not on file  Intimate Partner Violence:   . Fear of Current or Ex-Partner: Not on file  . Emotionally Abused: Not on file  . Physically Abused: Not on file  . Sexually Abused: Not on file    Review of Systems: 12 system ROS is negative except as noted above however limited because the daughter-in-law notes that she does not complain about anything.  There is no new urinary incontinence.  Physical Exam: General:   Alert,  well-nourished, pleasant.  Aphasia.  Does not answer most questions.. Hard of hearing and not wearing her hearing aids today. Requires assistance getting in and out of her wheelchair.  Head:  Normocephalic.  Mild right lower facial weakness. Eyes:  Sclera clear, no icterus.   Conjunctiva pink. Ears:  Normal auditory  acuity. Nose:  No deformity, discharge,  or lesions. Mouth:  No deformity or lesions.   Neck:  Supple; no masses or thyromegaly. Lungs:  Clear throughout to auscultation.   No wheezes. Heart:  Regular rate and rhythm; no murmurs. Abdomen:  Soft,nontender, nondistended, normal bowel sounds, no rebound or guarding. No hepatosplenomegaly.   Rectal:   No chemical dermatitis. Non-bleeding external hemorrhoids present. Palpable internal hemorrhoids.  No fissure or fistula. No prolapsing hemorrhoids. No rectal prolapse. Normal anocutaneous reflex. Brown stool in  the rectal vault. No stool ball palpable. No blood. Normal anal resting tone.  Chaperone: Desiree Msk:  Symmetrical. No boney deformities LAD: No inguinal or umbilical LAD Extremities:  No clubbing or edema. Neurologic:  Alert.  Oriented to self.  Disoriented.  Limited short and long-term memory. Skin:  Intact without significant lesions or rashes. Psych:  Alert and cooperative. Normal mood and affect.   Tryson Lumley L. Tarri Glenn, MD, MPH 09/10/2019, 8:13 AM

## 2019-09-13 LAB — BASIC METABOLIC PANEL
BUN/Creatinine Ratio: 14 (ref 12–28)
BUN: 16 mg/dL (ref 10–36)
CO2: 22 mmol/L (ref 20–29)
Calcium: 8.8 mg/dL (ref 8.7–10.3)
Chloride: 102 mmol/L (ref 96–106)
Creatinine, Ser: 1.16 mg/dL — ABNORMAL HIGH (ref 0.57–1.00)
GFR calc Af Amer: 46 mL/min/{1.73_m2} — ABNORMAL LOW (ref >59)
GFR calc non Af Amer: 40 mL/min/{1.73_m2} — ABNORMAL LOW (ref >59)
Glucose: 126 mg/dL — ABNORMAL HIGH (ref 65–99)
Potassium: 3.7 mmol/L (ref 3.5–5.2)
Sodium: 139 mmol/L (ref 134–144)

## 2019-09-13 LAB — SPECIMEN STATUS REPORT

## 2019-10-01 ENCOUNTER — Encounter: Payer: Self-pay | Admitting: Adult Health

## 2019-10-01 DIAGNOSIS — R5381 Other malaise: Secondary | ICD-10-CM

## 2019-10-01 DIAGNOSIS — Z7409 Other reduced mobility: Secondary | ICD-10-CM

## 2019-10-02 ENCOUNTER — Telehealth: Payer: Self-pay | Admitting: Adult Health

## 2019-10-02 NOTE — Telephone Encounter (Signed)
[name] from [facility] called in and stated they havent received the order for patient for Physical Therapy  Fax # [phone]

## 2019-10-02 NOTE — Telephone Encounter (Signed)
Order placed for patient to receive home health physical therapy through [facility] currently residing at [facility].  Per family, she has unfortunately had deconditioning and decreased functional ability with greater difficulty transferring.  Family is aware of potential increased risk to contract COVID-19 virus with use of therapy therefore advised to consider risk versus benefit.  Per daughter, discussion with family who agreed to pursue therapy at this time due to fear of worsening deconditioning despite being aware of risks.  Order placed for home health therapy.

## 2019-10-03 DIAGNOSIS — R2689 Other abnormalities of gait and mobility: Secondary | ICD-10-CM | POA: Diagnosis not present

## 2019-10-03 DIAGNOSIS — M6281 Muscle weakness (generalized): Secondary | ICD-10-CM | POA: Diagnosis not present

## 2019-10-03 DIAGNOSIS — R2681 Unsteadiness on feet: Secondary | ICD-10-CM | POA: Diagnosis not present

## 2019-10-03 DIAGNOSIS — I69398 Other sequelae of cerebral infarction: Secondary | ICD-10-CM | POA: Diagnosis not present

## 2019-10-03 NOTE — Telephone Encounter (Signed)
Referral order faxed to [facility] at [facility] [phone].  Fax confirmation received.

## 2019-10-04 DIAGNOSIS — R2689 Other abnormalities of gait and mobility: Secondary | ICD-10-CM | POA: Diagnosis not present

## 2019-10-04 DIAGNOSIS — M6281 Muscle weakness (generalized): Secondary | ICD-10-CM | POA: Diagnosis not present

## 2019-10-04 DIAGNOSIS — I69398 Other sequelae of cerebral infarction: Secondary | ICD-10-CM | POA: Diagnosis not present

## 2019-10-04 DIAGNOSIS — R2681 Unsteadiness on feet: Secondary | ICD-10-CM | POA: Diagnosis not present

## 2019-10-07 DIAGNOSIS — M6281 Muscle weakness (generalized): Secondary | ICD-10-CM | POA: Diagnosis not present

## 2019-10-07 DIAGNOSIS — R2681 Unsteadiness on feet: Secondary | ICD-10-CM | POA: Diagnosis not present

## 2019-10-07 DIAGNOSIS — I69398 Other sequelae of cerebral infarction: Secondary | ICD-10-CM | POA: Diagnosis not present

## 2019-10-07 DIAGNOSIS — R2689 Other abnormalities of gait and mobility: Secondary | ICD-10-CM | POA: Diagnosis not present

## 2019-10-09 ENCOUNTER — Encounter: Payer: Self-pay | Admitting: Internal Medicine

## 2019-10-09 DIAGNOSIS — R2681 Unsteadiness on feet: Secondary | ICD-10-CM | POA: Diagnosis not present

## 2019-10-09 DIAGNOSIS — M6281 Muscle weakness (generalized): Secondary | ICD-10-CM | POA: Diagnosis not present

## 2019-10-09 DIAGNOSIS — R2689 Other abnormalities of gait and mobility: Secondary | ICD-10-CM | POA: Diagnosis not present

## 2019-10-09 DIAGNOSIS — I69398 Other sequelae of cerebral infarction: Secondary | ICD-10-CM | POA: Diagnosis not present

## 2019-10-09 NOTE — Telephone Encounter (Signed)
Dudley with me if ok with lab

## 2019-10-10 NOTE — Telephone Encounter (Signed)
Urine cup has been placed upfront in the cabinet.

## 2019-10-11 ENCOUNTER — Other Ambulatory Visit: Payer: Self-pay | Admitting: *Deleted

## 2019-10-11 ENCOUNTER — Other Ambulatory Visit (INDEPENDENT_AMBULATORY_CARE_PROVIDER_SITE_OTHER): Payer: Medicare Other

## 2019-10-11 DIAGNOSIS — N390 Urinary tract infection, site not specified: Secondary | ICD-10-CM

## 2019-10-11 DIAGNOSIS — I69398 Other sequelae of cerebral infarction: Secondary | ICD-10-CM | POA: Diagnosis not present

## 2019-10-11 DIAGNOSIS — R2689 Other abnormalities of gait and mobility: Secondary | ICD-10-CM | POA: Diagnosis not present

## 2019-10-11 DIAGNOSIS — R2681 Unsteadiness on feet: Secondary | ICD-10-CM | POA: Diagnosis not present

## 2019-10-11 DIAGNOSIS — M6281 Muscle weakness (generalized): Secondary | ICD-10-CM | POA: Diagnosis not present

## 2019-10-11 LAB — URINALYSIS, ROUTINE W REFLEX MICROSCOPIC
Bilirubin Urine: NEGATIVE
Hgb urine dipstick: NEGATIVE
Ketones, ur: NEGATIVE
Nitrite: NEGATIVE
Specific Gravity, Urine: 1.02 (ref 1.000–1.030)
Total Protein, Urine: NEGATIVE
Urine Glucose: NEGATIVE
Urobilinogen, UA: 0.2 (ref 0.0–1.0)
pH: 6.5 (ref 5.0–8.0)

## 2019-10-12 LAB — URINE CULTURE
MICRO NUMBER:: 10046864
SPECIMEN QUALITY:: ADEQUATE

## 2019-10-14 DIAGNOSIS — R2689 Other abnormalities of gait and mobility: Secondary | ICD-10-CM | POA: Diagnosis not present

## 2019-10-14 DIAGNOSIS — M6281 Muscle weakness (generalized): Secondary | ICD-10-CM | POA: Diagnosis not present

## 2019-10-14 DIAGNOSIS — I69398 Other sequelae of cerebral infarction: Secondary | ICD-10-CM | POA: Diagnosis not present

## 2019-10-14 DIAGNOSIS — R2681 Unsteadiness on feet: Secondary | ICD-10-CM | POA: Diagnosis not present

## 2019-10-15 DIAGNOSIS — M6281 Muscle weakness (generalized): Secondary | ICD-10-CM | POA: Diagnosis not present

## 2019-10-15 DIAGNOSIS — R2681 Unsteadiness on feet: Secondary | ICD-10-CM | POA: Diagnosis not present

## 2019-10-15 DIAGNOSIS — I69398 Other sequelae of cerebral infarction: Secondary | ICD-10-CM | POA: Diagnosis not present

## 2019-10-15 DIAGNOSIS — R2689 Other abnormalities of gait and mobility: Secondary | ICD-10-CM | POA: Diagnosis not present

## 2019-10-17 ENCOUNTER — Ambulatory Visit (INDEPENDENT_AMBULATORY_CARE_PROVIDER_SITE_OTHER): Payer: Medicare Other | Admitting: *Deleted

## 2019-10-17 DIAGNOSIS — I480 Paroxysmal atrial fibrillation: Secondary | ICD-10-CM | POA: Diagnosis not present

## 2019-10-17 DIAGNOSIS — Z23 Encounter for immunization: Secondary | ICD-10-CM | POA: Diagnosis not present

## 2019-10-18 ENCOUNTER — Telehealth: Payer: Self-pay

## 2019-10-18 DIAGNOSIS — I69398 Other sequelae of cerebral infarction: Secondary | ICD-10-CM | POA: Diagnosis not present

## 2019-10-18 DIAGNOSIS — R2681 Unsteadiness on feet: Secondary | ICD-10-CM | POA: Diagnosis not present

## 2019-10-18 DIAGNOSIS — M6281 Muscle weakness (generalized): Secondary | ICD-10-CM | POA: Diagnosis not present

## 2019-10-18 DIAGNOSIS — R2689 Other abnormalities of gait and mobility: Secondary | ICD-10-CM | POA: Diagnosis not present

## 2019-10-18 NOTE — Telephone Encounter (Signed)
Left message for patient to remind of missed remote transmission.  

## 2019-10-21 DIAGNOSIS — R2689 Other abnormalities of gait and mobility: Secondary | ICD-10-CM | POA: Diagnosis not present

## 2019-10-21 DIAGNOSIS — M6281 Muscle weakness (generalized): Secondary | ICD-10-CM | POA: Diagnosis not present

## 2019-10-21 DIAGNOSIS — I69398 Other sequelae of cerebral infarction: Secondary | ICD-10-CM | POA: Diagnosis not present

## 2019-10-21 DIAGNOSIS — R2681 Unsteadiness on feet: Secondary | ICD-10-CM | POA: Diagnosis not present

## 2019-10-21 LAB — CUP PACEART REMOTE DEVICE CHECK
Battery Remaining Longevity: 133 mo
Battery Voltage: 3.07 V
Brady Statistic AP VP Percent: 0 %
Brady Statistic AP VS Percent: 0 %
Brady Statistic AS VP Percent: 0 %
Brady Statistic AS VS Percent: 0 %
Brady Statistic RA Percent Paced: 0.12 %
Brady Statistic RV Percent Paced: 99.89 %
Date Time Interrogation Session: 20210125112606
Implantable Lead Implant Date: 20020920
Implantable Lead Implant Date: 20020920
Implantable Lead Location: 753859
Implantable Lead Location: 753860
Implantable Lead Model: 5076
Implantable Lead Model: 5076
Implantable Pulse Generator Implant Date: 20200720
Lead Channel Impedance Value: 361 Ohm
Lead Channel Impedance Value: 399 Ohm
Lead Channel Impedance Value: 494 Ohm
Lead Channel Impedance Value: 551 Ohm
Lead Channel Pacing Threshold Amplitude: 1.125 V
Lead Channel Pacing Threshold Pulse Width: 0.4 ms
Lead Channel Sensing Intrinsic Amplitude: 1.625 mV
Lead Channel Sensing Intrinsic Amplitude: 1.625 mV
Lead Channel Setting Pacing Amplitude: 2 V
Lead Channel Setting Pacing Amplitude: 2.5 V
Lead Channel Setting Pacing Pulse Width: 0.4 ms
Lead Channel Setting Sensing Sensitivity: 4 mV

## 2019-10-23 ENCOUNTER — Encounter: Payer: Self-pay | Admitting: Adult Health

## 2019-10-23 DIAGNOSIS — R2689 Other abnormalities of gait and mobility: Secondary | ICD-10-CM | POA: Diagnosis not present

## 2019-10-23 DIAGNOSIS — I69398 Other sequelae of cerebral infarction: Secondary | ICD-10-CM | POA: Diagnosis not present

## 2019-10-23 DIAGNOSIS — M6281 Muscle weakness (generalized): Secondary | ICD-10-CM | POA: Diagnosis not present

## 2019-10-23 DIAGNOSIS — R2681 Unsteadiness on feet: Secondary | ICD-10-CM | POA: Diagnosis not present

## 2019-10-25 DIAGNOSIS — R2681 Unsteadiness on feet: Secondary | ICD-10-CM | POA: Diagnosis not present

## 2019-10-25 DIAGNOSIS — I69398 Other sequelae of cerebral infarction: Secondary | ICD-10-CM | POA: Diagnosis not present

## 2019-10-25 DIAGNOSIS — R2689 Other abnormalities of gait and mobility: Secondary | ICD-10-CM | POA: Diagnosis not present

## 2019-10-25 DIAGNOSIS — M6281 Muscle weakness (generalized): Secondary | ICD-10-CM | POA: Diagnosis not present

## 2019-10-28 DIAGNOSIS — R2689 Other abnormalities of gait and mobility: Secondary | ICD-10-CM | POA: Diagnosis not present

## 2019-10-28 DIAGNOSIS — M6281 Muscle weakness (generalized): Secondary | ICD-10-CM | POA: Diagnosis not present

## 2019-10-28 DIAGNOSIS — R2681 Unsteadiness on feet: Secondary | ICD-10-CM | POA: Diagnosis not present

## 2019-10-28 DIAGNOSIS — I69398 Other sequelae of cerebral infarction: Secondary | ICD-10-CM | POA: Diagnosis not present

## 2019-10-30 DIAGNOSIS — R2681 Unsteadiness on feet: Secondary | ICD-10-CM | POA: Diagnosis not present

## 2019-10-30 DIAGNOSIS — M6281 Muscle weakness (generalized): Secondary | ICD-10-CM | POA: Diagnosis not present

## 2019-10-30 DIAGNOSIS — I69398 Other sequelae of cerebral infarction: Secondary | ICD-10-CM | POA: Diagnosis not present

## 2019-10-30 DIAGNOSIS — R2689 Other abnormalities of gait and mobility: Secondary | ICD-10-CM | POA: Diagnosis not present

## 2019-11-01 DIAGNOSIS — R2689 Other abnormalities of gait and mobility: Secondary | ICD-10-CM | POA: Diagnosis not present

## 2019-11-01 DIAGNOSIS — I69398 Other sequelae of cerebral infarction: Secondary | ICD-10-CM | POA: Diagnosis not present

## 2019-11-01 DIAGNOSIS — M6281 Muscle weakness (generalized): Secondary | ICD-10-CM | POA: Diagnosis not present

## 2019-11-01 DIAGNOSIS — R2681 Unsteadiness on feet: Secondary | ICD-10-CM | POA: Diagnosis not present

## 2019-11-04 DIAGNOSIS — M6281 Muscle weakness (generalized): Secondary | ICD-10-CM | POA: Diagnosis not present

## 2019-11-04 DIAGNOSIS — R2681 Unsteadiness on feet: Secondary | ICD-10-CM | POA: Diagnosis not present

## 2019-11-04 DIAGNOSIS — I69398 Other sequelae of cerebral infarction: Secondary | ICD-10-CM | POA: Diagnosis not present

## 2019-11-04 DIAGNOSIS — R2689 Other abnormalities of gait and mobility: Secondary | ICD-10-CM | POA: Diagnosis not present

## 2019-11-06 DIAGNOSIS — I69398 Other sequelae of cerebral infarction: Secondary | ICD-10-CM | POA: Diagnosis not present

## 2019-11-06 DIAGNOSIS — R2689 Other abnormalities of gait and mobility: Secondary | ICD-10-CM | POA: Diagnosis not present

## 2019-11-06 DIAGNOSIS — R2681 Unsteadiness on feet: Secondary | ICD-10-CM | POA: Diagnosis not present

## 2019-11-06 DIAGNOSIS — M6281 Muscle weakness (generalized): Secondary | ICD-10-CM | POA: Diagnosis not present

## 2019-11-07 DIAGNOSIS — R2681 Unsteadiness on feet: Secondary | ICD-10-CM | POA: Diagnosis not present

## 2019-11-07 DIAGNOSIS — R2689 Other abnormalities of gait and mobility: Secondary | ICD-10-CM | POA: Diagnosis not present

## 2019-11-07 DIAGNOSIS — I69398 Other sequelae of cerebral infarction: Secondary | ICD-10-CM | POA: Diagnosis not present

## 2019-11-07 DIAGNOSIS — M6281 Muscle weakness (generalized): Secondary | ICD-10-CM | POA: Diagnosis not present

## 2019-11-12 DIAGNOSIS — M6281 Muscle weakness (generalized): Secondary | ICD-10-CM | POA: Diagnosis not present

## 2019-11-12 DIAGNOSIS — R2689 Other abnormalities of gait and mobility: Secondary | ICD-10-CM | POA: Diagnosis not present

## 2019-11-12 DIAGNOSIS — I69398 Other sequelae of cerebral infarction: Secondary | ICD-10-CM | POA: Diagnosis not present

## 2019-11-12 DIAGNOSIS — R2681 Unsteadiness on feet: Secondary | ICD-10-CM | POA: Diagnosis not present

## 2019-11-13 ENCOUNTER — Emergency Department (HOSPITAL_COMMUNITY)
Admission: EM | Admit: 2019-11-13 | Discharge: 2019-11-13 | Disposition: A | Discharge status: Home / Self Care | Payer: Medicare Other | Attending: Emergency Medicine | Admitting: Emergency Medicine

## 2019-11-13 ENCOUNTER — Emergency Department (HOSPITAL_COMMUNITY): Payer: Medicare Other

## 2019-11-13 DIAGNOSIS — W19XXXA Unspecified fall, initial encounter: Secondary | ICD-10-CM

## 2019-11-13 DIAGNOSIS — S299XXA Unspecified injury of thorax, initial encounter: Secondary | ICD-10-CM | POA: Diagnosis not present

## 2019-11-13 DIAGNOSIS — M4602 Spinal enthesopathy, cervical region: Secondary | ICD-10-CM | POA: Diagnosis not present

## 2019-11-13 DIAGNOSIS — R040 Epistaxis: Secondary | ICD-10-CM | POA: Diagnosis not present

## 2019-11-13 DIAGNOSIS — S0031XA Abrasion of nose, initial encounter: Secondary | ICD-10-CM | POA: Diagnosis not present

## 2019-11-13 DIAGNOSIS — Z950 Presence of cardiac pacemaker: Secondary | ICD-10-CM | POA: Diagnosis not present

## 2019-11-13 DIAGNOSIS — F0390 Unspecified dementia without behavioral disturbance: Secondary | ICD-10-CM | POA: Diagnosis not present

## 2019-11-13 DIAGNOSIS — Y939 Activity, unspecified: Secondary | ICD-10-CM | POA: Insufficient documentation

## 2019-11-13 DIAGNOSIS — Y92129 Unspecified place in nursing home as the place of occurrence of the external cause: Secondary | ICD-10-CM | POA: Insufficient documentation

## 2019-11-13 DIAGNOSIS — G4489 Other headache syndrome: Secondary | ICD-10-CM | POA: Diagnosis not present

## 2019-11-13 DIAGNOSIS — R52 Pain, unspecified: Secondary | ICD-10-CM | POA: Diagnosis not present

## 2019-11-13 DIAGNOSIS — E039 Hypothyroidism, unspecified: Secondary | ICD-10-CM | POA: Insufficient documentation

## 2019-11-13 DIAGNOSIS — W1830XA Fall on same level, unspecified, initial encounter: Secondary | ICD-10-CM | POA: Insufficient documentation

## 2019-11-13 DIAGNOSIS — R42 Dizziness and giddiness: Secondary | ICD-10-CM | POA: Diagnosis not present

## 2019-11-13 DIAGNOSIS — Y999 Unspecified external cause status: Secondary | ICD-10-CM | POA: Diagnosis not present

## 2019-11-13 DIAGNOSIS — R0902 Hypoxemia: Secondary | ICD-10-CM | POA: Diagnosis not present

## 2019-11-13 DIAGNOSIS — S0990XA Unspecified injury of head, initial encounter: Secondary | ICD-10-CM | POA: Diagnosis not present

## 2019-11-13 DIAGNOSIS — R58 Hemorrhage, not elsewhere classified: Secondary | ICD-10-CM | POA: Diagnosis not present

## 2019-11-13 DIAGNOSIS — I10 Essential (primary) hypertension: Secondary | ICD-10-CM | POA: Diagnosis not present

## 2019-11-13 DIAGNOSIS — I6523 Occlusion and stenosis of bilateral carotid arteries: Secondary | ICD-10-CM | POA: Diagnosis not present

## 2019-11-13 DIAGNOSIS — T07XXXA Unspecified multiple injuries, initial encounter: Secondary | ICD-10-CM | POA: Diagnosis present

## 2019-11-13 DIAGNOSIS — M5030 Other cervical disc degeneration, unspecified cervical region: Secondary | ICD-10-CM | POA: Diagnosis not present

## 2019-11-13 DIAGNOSIS — M79605 Pain in left leg: Secondary | ICD-10-CM | POA: Insufficient documentation

## 2019-11-13 DIAGNOSIS — M79604 Pain in right leg: Secondary | ICD-10-CM | POA: Diagnosis not present

## 2019-11-13 DIAGNOSIS — I2510 Atherosclerotic heart disease of native coronary artery without angina pectoris: Secondary | ICD-10-CM | POA: Insufficient documentation

## 2019-11-13 DIAGNOSIS — M25551 Pain in right hip: Secondary | ICD-10-CM | POA: Diagnosis not present

## 2019-11-13 DIAGNOSIS — S0081XA Abrasion of other part of head, initial encounter: Secondary | ICD-10-CM | POA: Insufficient documentation

## 2019-11-13 DIAGNOSIS — S79911A Unspecified injury of right hip, initial encounter: Secondary | ICD-10-CM | POA: Diagnosis not present

## 2019-11-13 LAB — CBC WITH DIFFERENTIAL/PLATELET
Abs Immature Granulocytes: 0.03 10*3/uL (ref 0.00–0.07)
Basophils Absolute: 0.1 10*3/uL (ref 0.0–0.1)
Basophils Relative: 1 %
Eosinophils Absolute: 0.2 10*3/uL (ref 0.0–0.5)
Eosinophils Relative: 3 %
HCT: 43.1 % (ref 36.0–46.0)
Hemoglobin: 14.7 g/dL (ref 12.0–15.0)
Immature Granulocytes: 1 %
Lymphocytes Relative: 17 %
Lymphs Abs: 1.1 10*3/uL (ref 0.7–4.0)
MCH: 31.5 pg (ref 26.0–34.0)
MCHC: 34.1 g/dL (ref 30.0–36.0)
MCV: 92.5 fL (ref 80.0–100.0)
Monocytes Absolute: 0.5 10*3/uL (ref 0.1–1.0)
Monocytes Relative: 8 %
Neutro Abs: 4.4 10*3/uL (ref 1.7–7.7)
Neutrophils Relative %: 70 %
Platelets: 219 10*3/uL (ref 150–400)
RBC: 4.66 MIL/uL (ref 3.87–5.11)
RDW: 12.9 % (ref 11.5–15.5)
WBC: 6.3 10*3/uL (ref 4.0–10.5)
nRBC: 0 % (ref 0.0–0.2)

## 2019-11-13 LAB — BASIC METABOLIC PANEL
Anion gap: 14 (ref 5–15)
BUN: 20 mg/dL (ref 8–23)
CO2: 23 mmol/L (ref 22–32)
Calcium: 9 mg/dL (ref 8.9–10.3)
Chloride: 105 mmol/L (ref 98–111)
Creatinine, Ser: 0.97 mg/dL (ref 0.44–1.00)
GFR calc Af Amer: 58 mL/min — ABNORMAL LOW (ref >60)
GFR calc non Af Amer: 50 mL/min — ABNORMAL LOW (ref >60)
Glucose, Bld: 107 mg/dL — ABNORMAL HIGH (ref 70–99)
Potassium: 3.7 mmol/L (ref 3.5–5.1)
Sodium: 142 mmol/L (ref 135–145)

## 2019-11-13 MED ORDER — SODIUM CHLORIDE 0.9 % IV SOLN: INTRAVENOUS | Status: DC

## 2019-11-13 NOTE — ED Provider Notes (Signed)
[facility] EMERGENCY DEPARTMENT Provider Note   CSN: [id number] Arrival date & time: 11/13/19  [id number]     History No chief complaint on file.   [name] is a 86 y.o. female.  86 year old female who presents after unwitnessed fall from nursing home.  Patient does have a history of dementia.  Was noted to not be able to ambulate after the event and has some dried blood in her nose.  EMS called and patient's vital signs were stable.  She was at her neurological baseline.  Transported here for further management.        Past Medical History:  Diagnosis Date  . Aphasia 01/09/2009  . Arthritis    right hand  . 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 CEREBROVASCULAR ACCIDENT, HX OF 11/20/2007  . CHF (congestive heart failure) ([place])   . Coronary artery disease   . CVA 01/09/2009  . DEMENTIA 05/19/2008  . Dementia ([place])   . DIVERTICULOSIS, COLON 11/20/2007  . FATIGUE 11/20/2007  . Femoral fracture ([place]) 07/01/2013  . GLUCOSE INTOLERANCE 12/06/2010  . HYPERLIPIDEMIA 11/20/2007  . HYPERTENSION 11/20/2007   Echo was done 07/07/11: mod LVH, EF 55-60%, grade 1 diast dysfxn, mild MR, mild LAE, mod TR, PASP 39  . HYPOTHYROIDISM 11/20/2007  . Impaired glucose tolerance 06/03/2011  . IRON DEFICIENCY 12/06/2010  . Pacemaker 2002  . PACEMAKER, PERMANENT 01/09/2009  . TIA 05/14/2003   elevated CE's in setting of TIA and falls in 10/12 - echo normal with no further cardiac w/u pursued  . TIA (transient ischemic attack) 10/10/2011  . Urinary incontinence 01/09/2009    Patient Active Problem List   Diagnosis Date Noted  . Bloody diarrhea 06/25/2019  . Pressure ulcer of sacral region, stage 2 ([place]) 02/21/2019  . Hypothermia 02/20/2019  . Dysphagia, post-stroke   . Global aphasia   . Acute right ACA stroke ([place]) 12/07/2018  . Stroke (cerebrum) ([place]) 12/03/2018  . Cough 10/11/2018  . Abnormal breath sounds 10/11/2018  . Closed nondisplaced fracture of proximal phalanx of lesser  toe of right foot 06/26/2018  . Right ankle pain 06/21/2018  . Right foot pain 06/21/2018  . Abnormal CXR 04/26/2017  . Lobar pneumonia ([place]) 01/20/2017  . Acute on chronic respiratory failure with hypoxia ([place]) 01/17/2017  . Acute respiratory failure with hypoxia ([place]) 01/17/2017  . DOE (dyspnea on exertion) 10/23/2014  . Hyponatremia 10/23/2014  . RLL pneumonia 10/11/2013  . Other specified anemias 10/10/2013  . Acute respiratory failure ([place]) 08/15/2013  . Aspiration pneumonia ([place]) 08/15/2013  . Altered mental status 08/15/2013  . Incarcerated femoral hernia 08/14/2013  . Esophageal reflux 08/05/2013  . Unspecified constipation 07/03/2013  . Closed fracture of left hip ([place]) 07/01/2013  . 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 Atrial fibrillation ([place]) 10/30/2012  . Low back pain 10/26/2012  . Syncope 10/26/2012  . PNA (pneumonia) 09/26/2012  . Acute lower UTI 09/26/2012  . Hypokalemia 09/26/2012  . Leukocytosis 09/26/2012  . Cardiac enzymes elevated 07/25/2011  . Impaired glucose tolerance 06/03/2011  . Preventative health care 06/03/2011  . IRON DEFICIENCY 12/06/2010  . 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 CVA (cerebral vascular accident) ([place]) 01/09/2009  . Aphasia 01/09/2009  . Urinary incontinence 01/09/2009  . PACEMAKER, PERMANENT 01/09/2009  . Dementia ([place]) 05/19/2008  . Hypothyroidism 11/20/2007  . Hyperlipidemia 11/20/2007  . Essential hypertension 11/20/2007  . DIVERTICULOSIS, COLON 11/20/2007  . FATIGUE 11/20/2007  . CEREBROVASCULAR ACCIDENT, HX OF 11/20/2007  . TIA 05/14/2003  Past Surgical History:  Procedure Laterality Date  . ABDOMINAL HYSTERECTOMY    . CHOLECYSTECTOMY    . CYSTOCELE REPAIR N/A 11/26/2012   Procedure: Cystocele Repair with Lefort Colpocleisis.  colpectomy Levatorplasty Perineorraphy;  Surgeon: Lovenia Kim, MD;  Location: [facility];  Service: Gynecology;  Laterality: N/A;  Cystocele Repair with Lefort  Colpocleisis.  Marland Kitchen HIP ARTHROPLASTY Left 07/02/2013   Procedure: ARTHROPLASTY BIPOLAR HIP;  Surgeon: Mauri Pole, MD;  Location: [facility];  Service: Orthopedics;  Laterality: Left;  . INGUINAL HERNIA REPAIR Right 08/13/2013   Procedure: HERNIA REPAIR INGUINAL INCARCERATED;  Surgeon: Ralene Ok, MD;  Location: [facility];  Service: General;  Laterality: Right;  . Medtronic Cynthia dual chamber pacemaker serial number was [id number] H...04/15/2009    . PPM GENERATOR CHANGEOUT N/A 04/15/2019   Procedure: PPM GENERATOR CHANGEOUT;  Surgeon: Evans Lance, MD;  Location: [facility];  Service: Cardiovascular;  Laterality: N/A;  . s/p cerebral aneurysm  1991  . s/p pacemaker DDD    . s/p retinal attachment    . s/p thyroidectomy       OB History   No obstetric history on file.     Family History  Problem Relation Age of Onset  . Cancer Mother        breast cancer    Social History   Tobacco Use  . Smoking status: Never Smoker  . Smokeless tobacco: Never Used  Substance Use Topics  . Alcohol use: No  . Drug use: No    Home Medications Prior to Admission medications   Medication Sig Start Date End Date Taking? Authorizing Provider  ALPRAZolam Duanne Moron) 0.25 MG tablet Take 0.25 mg by mouth daily as needed for anxiety.    [provider]  apixaban (ELIQUIS) 5 MG TABS tablet Take 1 tablet (5 mg total) by mouth 2 (two) times daily. 09/03/19   Evans Lance, MD  Calcium Carbonate-Vitamin D (CALTRATE 600+D) 600-400 MG-UNIT per tablet Take 1 tablet by mouth at bedtime.     [provider]  docusate sodium (COLACE) 100 MG capsule Take 100 mg by mouth daily.    [provider]  donepezil (ARICEPT) 10 MG tablet TAKE 1 TABLET(10 MG) BY MOUTH DAILY 09/03/19   Biagio Borg, MD  furosemide (LASIX) 40 MG tablet Take 1 tablet (40 mg total) by mouth daily. 08/29/19 11/27/19  Evans Lance, MD  levothyroxine (SYNTHROID) 125 MCG tablet TAKE 1 TABLET(125 MCG) BY MOUTH DAILY  09/03/19   Biagio Borg, MD  Multiple Vitamins-Minerals (CENTRUM SILVER 50+WOMEN) TABS Take 1 tablet by mouth daily.    [provider]  pantoprazole (PROTONIX) 40 MG tablet Take 1 tablet (40 mg total) by mouth daily. 09/03/19   Biagio Borg, MD  polyvinyl alcohol (LIQUIFILM TEARS) 1.4 % ophthalmic solution Place 1 drop into both eyes 3 (three) times daily.    [provider]  pravastatin (PRAVACHOL) 10 MG tablet Take 1 tablet (10 mg total) by mouth at bedtime. 09/03/19   Biagio Borg, MD  valsartan (DIOVAN) 80 MG tablet Take 1 tablet (80 mg total) by mouth daily. Take 80 mg by mouth daily. 09/03/19   Biagio Borg, MD    Allergies    Codeine  Review of Systems   Review of Systems  All other systems reviewed and are negative.   Physical Exam Updated Vital Signs BP (!) 151/63 (BP Location: Right Arm)   Pulse 62   Temp 98.6 F (37  C) (Oral)   Resp 20   SpO2 96%   Physical Exam Vitals and nursing note reviewed.  Constitutional:      General: She is not in acute distress.    Appearance: Normal appearance. She is well-developed. She is not toxic-appearing.  HENT:     Head:      Nose:     Comments: Dried blood noted in nares.  No active bleeding Eyes:     General: Lids are normal.     Conjunctiva/sclera: Conjunctivae normal.     Pupils: Pupils are equal, round, and reactive to light.  Neck:     Thyroid: No thyroid mass.     Trachea: No tracheal deviation.  Cardiovascular:     Rate and Rhythm: Normal rate and regular rhythm.     Heart sounds: Normal heart sounds. No murmur. No gallop.   Pulmonary:     Effort: Pulmonary effort is normal. No respiratory distress.     Breath sounds: Normal breath sounds. No stridor. No decreased breath sounds, wheezing, rhonchi or rales.  Abdominal:     General: Bowel sounds are normal. There is no distension.     Palpations: Abdomen is soft.     Tenderness: There is no abdominal tenderness. There is no rebound.    Musculoskeletal:        General: No tenderness. Normal range of motion.     Cervical back: Normal range of motion and neck supple.     Comments: Pain with movement of both right and left lower extremities.  Shortening to right leg noted.  Neurovascular status intact at both feet  Skin:    General: Skin is warm and dry.     Findings: No abrasion or rash.  Neurological:     Mental Status: She is alert. Mental status is at baseline.     GCS: GCS eye subscore is 4. GCS verbal subscore is 5. GCS motor subscore is 6.     Cranial Nerves: No cranial nerve deficit.     Sensory: No sensory deficit.  Psychiatric:        Mood and Affect: Affect is flat.        Behavior: Behavior is cooperative.     ED Results / Procedures / Treatments   Labs (all labs ordered are listed, but only abnormal results are displayed) Labs Reviewed  CBC WITH DIFFERENTIAL/PLATELET  BASIC METABOLIC PANEL    EKG None  Radiology No results found.  Procedures Procedures (including critical care time)  Medications Ordered in ED Medications  0.9 %  sodium chloride infusion (has no administration in time range)    ED Course  I have reviewed the triage vital signs and the nursing notes.  Pertinent labs & imaging results that were available during my care of the patient were reviewed by me and considered in my medical decision making (see chart for details).    MDM Rules/Calculators/A&P                      Patient's labs and x-rays reviewed here and no acute findings noted.  Had discussion with patient's son who states that patient has been wheelchair-bound at this time.  Will discharge back to facility Final Clinical Impression(s) / ED Diagnoses Final diagnoses:  Fall    Rx / DC Orders ED Discharge Orders    None       Lacretia Leigh, MD 11/13/19 [phone]

## 2019-11-13 NOTE — ED Notes (Signed)
Pt returns from radiology family member at bedside.

## 2019-11-13 NOTE — ED Triage Notes (Signed)
Unwitnessed fall at facility,  Ems reported shortening to L leg and bleeding from nares.  Hx of memory loss.  Pt alert to self only, giggles with assessment questions.  No outward pain responses with rest or palp to bilateral hips.  Dried blood noted to nose with no active bleeding.  Abrasion to mid forehead.

## 2019-11-14 DIAGNOSIS — Z23 Encounter for immunization: Secondary | ICD-10-CM | POA: Diagnosis not present

## 2019-11-15 DIAGNOSIS — I69398 Other sequelae of cerebral infarction: Secondary | ICD-10-CM | POA: Diagnosis not present

## 2019-11-15 DIAGNOSIS — R2689 Other abnormalities of gait and mobility: Secondary | ICD-10-CM | POA: Diagnosis not present

## 2019-11-15 DIAGNOSIS — M6281 Muscle weakness (generalized): Secondary | ICD-10-CM | POA: Diagnosis not present

## 2019-11-15 DIAGNOSIS — R2681 Unsteadiness on feet: Secondary | ICD-10-CM | POA: Diagnosis not present

## 2019-11-18 DIAGNOSIS — R2689 Other abnormalities of gait and mobility: Secondary | ICD-10-CM | POA: Diagnosis not present

## 2019-11-18 DIAGNOSIS — M6281 Muscle weakness (generalized): Secondary | ICD-10-CM | POA: Diagnosis not present

## 2019-11-18 DIAGNOSIS — I69398 Other sequelae of cerebral infarction: Secondary | ICD-10-CM | POA: Diagnosis not present

## 2019-11-18 DIAGNOSIS — R2681 Unsteadiness on feet: Secondary | ICD-10-CM | POA: Diagnosis not present

## 2019-11-19 DIAGNOSIS — R2689 Other abnormalities of gait and mobility: Secondary | ICD-10-CM | POA: Diagnosis not present

## 2019-11-19 DIAGNOSIS — I69398 Other sequelae of cerebral infarction: Secondary | ICD-10-CM | POA: Diagnosis not present

## 2019-11-19 DIAGNOSIS — R2681 Unsteadiness on feet: Secondary | ICD-10-CM | POA: Diagnosis not present

## 2019-11-19 DIAGNOSIS — M6281 Muscle weakness (generalized): Secondary | ICD-10-CM | POA: Diagnosis not present

## 2019-11-21 DIAGNOSIS — M6281 Muscle weakness (generalized): Secondary | ICD-10-CM | POA: Diagnosis not present

## 2019-11-21 DIAGNOSIS — R2681 Unsteadiness on feet: Secondary | ICD-10-CM | POA: Diagnosis not present

## 2019-11-21 DIAGNOSIS — I69398 Other sequelae of cerebral infarction: Secondary | ICD-10-CM | POA: Diagnosis not present

## 2019-11-21 DIAGNOSIS — R2689 Other abnormalities of gait and mobility: Secondary | ICD-10-CM | POA: Diagnosis not present

## 2019-11-26 DIAGNOSIS — R2681 Unsteadiness on feet: Secondary | ICD-10-CM | POA: Diagnosis not present

## 2019-11-26 DIAGNOSIS — I69398 Other sequelae of cerebral infarction: Secondary | ICD-10-CM | POA: Diagnosis not present

## 2019-11-26 DIAGNOSIS — R2689 Other abnormalities of gait and mobility: Secondary | ICD-10-CM | POA: Diagnosis not present

## 2019-11-26 DIAGNOSIS — M6281 Muscle weakness (generalized): Secondary | ICD-10-CM | POA: Diagnosis not present

## 2019-11-28 DIAGNOSIS — R2689 Other abnormalities of gait and mobility: Secondary | ICD-10-CM | POA: Diagnosis not present

## 2019-11-28 DIAGNOSIS — I69398 Other sequelae of cerebral infarction: Secondary | ICD-10-CM | POA: Diagnosis not present

## 2019-11-28 DIAGNOSIS — R2681 Unsteadiness on feet: Secondary | ICD-10-CM | POA: Diagnosis not present

## 2019-11-28 DIAGNOSIS — M6281 Muscle weakness (generalized): Secondary | ICD-10-CM | POA: Diagnosis not present

## 2019-12-02 DIAGNOSIS — I69398 Other sequelae of cerebral infarction: Secondary | ICD-10-CM | POA: Diagnosis not present

## 2019-12-02 DIAGNOSIS — R2681 Unsteadiness on feet: Secondary | ICD-10-CM | POA: Diagnosis not present

## 2019-12-02 DIAGNOSIS — M6281 Muscle weakness (generalized): Secondary | ICD-10-CM | POA: Diagnosis not present

## 2019-12-02 DIAGNOSIS — R2689 Other abnormalities of gait and mobility: Secondary | ICD-10-CM | POA: Diagnosis not present

## 2019-12-04 DIAGNOSIS — R2681 Unsteadiness on feet: Secondary | ICD-10-CM | POA: Diagnosis not present

## 2019-12-04 DIAGNOSIS — I69398 Other sequelae of cerebral infarction: Secondary | ICD-10-CM | POA: Diagnosis not present

## 2019-12-04 DIAGNOSIS — M6281 Muscle weakness (generalized): Secondary | ICD-10-CM | POA: Diagnosis not present

## 2019-12-04 DIAGNOSIS — R2689 Other abnormalities of gait and mobility: Secondary | ICD-10-CM | POA: Diagnosis not present

## 2019-12-09 DIAGNOSIS — R2689 Other abnormalities of gait and mobility: Secondary | ICD-10-CM | POA: Diagnosis not present

## 2019-12-09 DIAGNOSIS — M6281 Muscle weakness (generalized): Secondary | ICD-10-CM | POA: Diagnosis not present

## 2019-12-09 DIAGNOSIS — R2681 Unsteadiness on feet: Secondary | ICD-10-CM | POA: Diagnosis not present

## 2019-12-09 DIAGNOSIS — I69398 Other sequelae of cerebral infarction: Secondary | ICD-10-CM | POA: Diagnosis not present

## 2019-12-12 DIAGNOSIS — I69398 Other sequelae of cerebral infarction: Secondary | ICD-10-CM | POA: Diagnosis not present

## 2019-12-12 DIAGNOSIS — R2681 Unsteadiness on feet: Secondary | ICD-10-CM | POA: Diagnosis not present

## 2019-12-12 DIAGNOSIS — M6281 Muscle weakness (generalized): Secondary | ICD-10-CM | POA: Diagnosis not present

## 2019-12-12 DIAGNOSIS — R2689 Other abnormalities of gait and mobility: Secondary | ICD-10-CM | POA: Diagnosis not present

## 2019-12-18 DIAGNOSIS — M6281 Muscle weakness (generalized): Secondary | ICD-10-CM | POA: Diagnosis not present

## 2019-12-18 DIAGNOSIS — R2689 Other abnormalities of gait and mobility: Secondary | ICD-10-CM | POA: Diagnosis not present

## 2019-12-18 DIAGNOSIS — I69398 Other sequelae of cerebral infarction: Secondary | ICD-10-CM | POA: Diagnosis not present

## 2019-12-18 DIAGNOSIS — R2681 Unsteadiness on feet: Secondary | ICD-10-CM | POA: Diagnosis not present

## 2019-12-19 DIAGNOSIS — R2681 Unsteadiness on feet: Secondary | ICD-10-CM | POA: Diagnosis not present

## 2019-12-19 DIAGNOSIS — R2689 Other abnormalities of gait and mobility: Secondary | ICD-10-CM | POA: Diagnosis not present

## 2019-12-19 DIAGNOSIS — I69398 Other sequelae of cerebral infarction: Secondary | ICD-10-CM | POA: Diagnosis not present

## 2019-12-19 DIAGNOSIS — M6281 Muscle weakness (generalized): Secondary | ICD-10-CM | POA: Diagnosis not present

## 2019-12-20 ENCOUNTER — Telehealth: Payer: Self-pay | Admitting: Internal Medicine

## 2019-12-20 NOTE — Telephone Encounter (Signed)
I spoke to the patient's daughter-in-law who will need to accompany the patient to her upcoming appointment with Dr Lovena Le due to patient's dementia.

## 2019-12-20 NOTE — Telephone Encounter (Signed)
Patient's daughter-in-law Wells Guiles calling to request she come with the patient to her appointment 01/21/2020, because the patient has dementia.

## 2019-12-23 DIAGNOSIS — M6281 Muscle weakness (generalized): Secondary | ICD-10-CM | POA: Diagnosis not present

## 2019-12-23 DIAGNOSIS — I69398 Other sequelae of cerebral infarction: Secondary | ICD-10-CM | POA: Diagnosis not present

## 2019-12-23 DIAGNOSIS — R2681 Unsteadiness on feet: Secondary | ICD-10-CM | POA: Diagnosis not present

## 2019-12-23 DIAGNOSIS — R2689 Other abnormalities of gait and mobility: Secondary | ICD-10-CM | POA: Diagnosis not present

## 2019-12-25 ENCOUNTER — Encounter: Payer: Medicare Other | Admitting: Internal Medicine

## 2019-12-25 DIAGNOSIS — I69398 Other sequelae of cerebral infarction: Secondary | ICD-10-CM | POA: Diagnosis not present

## 2019-12-25 DIAGNOSIS — R2681 Unsteadiness on feet: Secondary | ICD-10-CM | POA: Diagnosis not present

## 2019-12-25 DIAGNOSIS — R2689 Other abnormalities of gait and mobility: Secondary | ICD-10-CM | POA: Diagnosis not present

## 2019-12-25 DIAGNOSIS — M6281 Muscle weakness (generalized): Secondary | ICD-10-CM | POA: Diagnosis not present

## 2019-12-30 ENCOUNTER — Encounter: Payer: Self-pay | Admitting: Internal Medicine

## 2019-12-30 ENCOUNTER — Emergency Department (HOSPITAL_COMMUNITY): Payer: Medicare Other

## 2019-12-30 ENCOUNTER — Inpatient Hospital Stay (HOSPITAL_COMMUNITY)
Admission: EM | Admit: 2019-12-30 | Discharge: 2020-01-01 | DRG: 071 | Disposition: A | Discharge status: Nursing Home (Includes ICF) | Payer: Medicare Other | Source: Skilled Nursing Facility | Attending: Internal Medicine | Admitting: Internal Medicine

## 2019-12-30 ENCOUNTER — Other Ambulatory Visit: Payer: Self-pay

## 2019-12-30 ENCOUNTER — Encounter (HOSPITAL_COMMUNITY): Payer: Self-pay

## 2019-12-30 DIAGNOSIS — N390 Urinary tract infection, site not specified: Secondary | ICD-10-CM | POA: Diagnosis present

## 2019-12-30 DIAGNOSIS — I69398 Other sequelae of cerebral infarction: Secondary | ICD-10-CM | POA: Diagnosis not present

## 2019-12-30 DIAGNOSIS — G9341 Metabolic encephalopathy: Principal | ICD-10-CM | POA: Diagnosis present

## 2019-12-30 DIAGNOSIS — R2689 Other abnormalities of gait and mobility: Secondary | ICD-10-CM | POA: Diagnosis not present

## 2019-12-30 DIAGNOSIS — F0390 Unspecified dementia without behavioral disturbance: Secondary | ICD-10-CM | POA: Diagnosis present

## 2019-12-30 DIAGNOSIS — I480 Paroxysmal atrial fibrillation: Secondary | ICD-10-CM | POA: Diagnosis not present

## 2019-12-30 DIAGNOSIS — Z79890 Hormone replacement therapy (postmenopausal): Secondary | ICD-10-CM

## 2019-12-30 DIAGNOSIS — R1310 Dysphagia, unspecified: Secondary | ICD-10-CM | POA: Diagnosis present

## 2019-12-30 DIAGNOSIS — Z20822 Contact with and (suspected) exposure to covid-19: Secondary | ICD-10-CM | POA: Diagnosis not present

## 2019-12-30 DIAGNOSIS — I4891 Unspecified atrial fibrillation: Secondary | ICD-10-CM | POA: Diagnosis present

## 2019-12-30 DIAGNOSIS — E785 Hyperlipidemia, unspecified: Secondary | ICD-10-CM | POA: Diagnosis present

## 2019-12-30 DIAGNOSIS — I442 Atrioventricular block, complete: Secondary | ICD-10-CM | POA: Diagnosis present

## 2019-12-30 DIAGNOSIS — I2510 Atherosclerotic heart disease of native coronary artery without angina pectoris: Secondary | ICD-10-CM | POA: Diagnosis present

## 2019-12-30 DIAGNOSIS — R531 Weakness: Secondary | ICD-10-CM | POA: Diagnosis not present

## 2019-12-30 DIAGNOSIS — R5383 Other fatigue: Secondary | ICD-10-CM | POA: Diagnosis not present

## 2019-12-30 DIAGNOSIS — E039 Hypothyroidism, unspecified: Secondary | ICD-10-CM | POA: Diagnosis present

## 2019-12-30 DIAGNOSIS — Z885 Allergy status to narcotic agent status: Secondary | ICD-10-CM

## 2019-12-30 DIAGNOSIS — I4510 Unspecified right bundle-branch block: Secondary | ICD-10-CM | POA: Diagnosis not present

## 2019-12-30 DIAGNOSIS — I69320 Aphasia following cerebral infarction: Secondary | ICD-10-CM

## 2019-12-30 DIAGNOSIS — R410 Disorientation, unspecified: Secondary | ICD-10-CM | POA: Diagnosis not present

## 2019-12-30 DIAGNOSIS — R7302 Impaired glucose tolerance (oral): Secondary | ICD-10-CM | POA: Diagnosis present

## 2019-12-30 DIAGNOSIS — M6281 Muscle weakness (generalized): Secondary | ICD-10-CM | POA: Diagnosis not present

## 2019-12-30 DIAGNOSIS — Z79899 Other long term (current) drug therapy: Secondary | ICD-10-CM

## 2019-12-30 DIAGNOSIS — Z950 Presence of cardiac pacemaker: Secondary | ICD-10-CM

## 2019-12-30 DIAGNOSIS — I517 Cardiomegaly: Secondary | ICD-10-CM | POA: Diagnosis not present

## 2019-12-30 DIAGNOSIS — Z8673 Personal history of transient ischemic attack (TIA), and cerebral infarction without residual deficits: Secondary | ICD-10-CM

## 2019-12-30 DIAGNOSIS — K219 Gastro-esophageal reflux disease without esophagitis: Secondary | ICD-10-CM | POA: Diagnosis present

## 2019-12-30 DIAGNOSIS — E876 Hypokalemia: Secondary | ICD-10-CM | POA: Diagnosis present

## 2019-12-30 DIAGNOSIS — R4182 Altered mental status, unspecified: Secondary | ICD-10-CM

## 2019-12-30 DIAGNOSIS — I10 Essential (primary) hypertension: Secondary | ICD-10-CM | POA: Diagnosis present

## 2019-12-30 DIAGNOSIS — E860 Dehydration: Secondary | ICD-10-CM

## 2019-12-30 DIAGNOSIS — Z96642 Presence of left artificial hip joint: Secondary | ICD-10-CM | POA: Diagnosis present

## 2019-12-30 DIAGNOSIS — Z803 Family history of malignant neoplasm of breast: Secondary | ICD-10-CM

## 2019-12-30 DIAGNOSIS — G9340 Encephalopathy, unspecified: Secondary | ICD-10-CM | POA: Diagnosis present

## 2019-12-30 DIAGNOSIS — R2681 Unsteadiness on feet: Secondary | ICD-10-CM | POA: Diagnosis not present

## 2019-12-30 DIAGNOSIS — R5381 Other malaise: Secondary | ICD-10-CM | POA: Diagnosis not present

## 2019-12-30 DIAGNOSIS — Z90710 Acquired absence of both cervix and uterus: Secondary | ICD-10-CM

## 2019-12-30 DIAGNOSIS — R1312 Dysphagia, oropharyngeal phase: Secondary | ICD-10-CM | POA: Diagnosis not present

## 2019-12-30 DIAGNOSIS — Z7901 Long term (current) use of anticoagulants: Secondary | ICD-10-CM

## 2019-12-30 DIAGNOSIS — I69391 Dysphagia following cerebral infarction: Secondary | ICD-10-CM

## 2019-12-30 DIAGNOSIS — Z9049 Acquired absence of other specified parts of digestive tract: Secondary | ICD-10-CM

## 2019-12-30 LAB — CBC WITH DIFFERENTIAL/PLATELET
Abs Immature Granulocytes: 0.01 10*3/uL (ref 0.00–0.07)
Basophils Absolute: 0.1 10*3/uL (ref 0.0–0.1)
Basophils Relative: 1 %
Eosinophils Absolute: 0.2 10*3/uL (ref 0.0–0.5)
Eosinophils Relative: 3 %
HCT: 45.6 % (ref 36.0–46.0)
Hemoglobin: 15.4 g/dL — ABNORMAL HIGH (ref 12.0–15.0)
Immature Granulocytes: 0 %
Lymphocytes Relative: 35 %
Lymphs Abs: 1.8 10*3/uL (ref 0.7–4.0)
MCH: 31.6 pg (ref 26.0–34.0)
MCHC: 33.8 g/dL (ref 30.0–36.0)
MCV: 93.4 fL (ref 80.0–100.0)
Monocytes Absolute: 0.6 10*3/uL (ref 0.1–1.0)
Monocytes Relative: 12 %
Neutro Abs: 2.6 10*3/uL (ref 1.7–7.7)
Neutrophils Relative %: 49 %
Platelets: 255 10*3/uL (ref 150–400)
RBC: 4.88 MIL/uL (ref 3.87–5.11)
RDW: 13.1 % (ref 11.5–15.5)
WBC: 5.2 10*3/uL (ref 4.0–10.5)
nRBC: 0 % (ref 0.0–0.2)

## 2019-12-30 LAB — RESPIRATORY PANEL BY RT PCR (FLU A&B, COVID)
Influenza A by PCR: NEGATIVE
Influenza B by PCR: NEGATIVE
SARS Coronavirus 2 by RT PCR: NEGATIVE

## 2019-12-30 LAB — COMPREHENSIVE METABOLIC PANEL
ALT: 24 U/L (ref 0–44)
AST: 33 U/L (ref 15–41)
Albumin: 3.8 g/dL (ref 3.5–5.0)
Alkaline Phosphatase: 72 U/L (ref 38–126)
Anion gap: 10 (ref 5–15)
BUN: 19 mg/dL (ref 8–23)
CO2: 25 mmol/L (ref 22–32)
Calcium: 8.9 mg/dL (ref 8.9–10.3)
Chloride: 105 mmol/L (ref 98–111)
Creatinine, Ser: 0.95 mg/dL (ref 0.44–1.00)
GFR calc Af Amer: 59 mL/min — ABNORMAL LOW (ref >60)
GFR calc non Af Amer: 51 mL/min — ABNORMAL LOW (ref >60)
Glucose, Bld: 107 mg/dL — ABNORMAL HIGH (ref 70–99)
Potassium: 3.9 mmol/L (ref 3.5–5.1)
Sodium: 140 mmol/L (ref 135–145)
Total Bilirubin: 1 mg/dL (ref 0.3–1.2)
Total Protein: 7.2 g/dL (ref 6.5–8.1)

## 2019-12-30 LAB — PROTIME-INR
INR: 1.4 — ABNORMAL HIGH (ref 0.8–1.2)
Prothrombin Time: 16.9 seconds — ABNORMAL HIGH (ref 11.4–15.2)

## 2019-12-30 LAB — LACTIC ACID, PLASMA
Lactic Acid, Venous: 2 mmol/L (ref 0.5–1.9)
Lactic Acid, Venous: 1.3 mmol/L (ref 0.5–1.9)

## 2019-12-30 LAB — CBG MONITORING, ED: Glucose-Capillary: 117 mg/dL — ABNORMAL HIGH (ref 70–99)

## 2019-12-30 LAB — TROPONIN I (HIGH SENSITIVITY)
Troponin I (High Sensitivity): 28 ng/L — ABNORMAL HIGH (ref <18)
Troponin I (High Sensitivity): 28 ng/L — ABNORMAL HIGH (ref <18)

## 2019-12-30 MED ORDER — SODIUM CHLORIDE 0.9 % IV SOLN: Freq: Once | INTRAVENOUS | Status: DC

## 2019-12-30 MED ORDER — SENNOSIDES-DOCUSATE SODIUM 8.6-50 MG PO TABS: 1.0000 | ORAL_TABLET | Freq: Every evening | ORAL | Status: DC | PRN

## 2019-12-30 MED ORDER — SODIUM CHLORIDE 0.9 % IV SOLN: INTRAVENOUS | Status: AC

## 2019-12-30 MED ORDER — LEVOTHYROXINE SODIUM 125 MCG PO TABS
125.0000 ug | ORAL_TABLET | Freq: Every day | ORAL | Status: DC
  Administered 2019-12-31 – 2020-01-01 (×2): 125 ug via ORAL
  Filled 2019-12-30 (×2): qty 1

## 2019-12-30 MED ORDER — IRBESARTAN 75 MG PO TABS
75.0000 mg | ORAL_TABLET | Freq: Every day | ORAL | Status: DC
  Administered 2019-12-31 – 2020-01-01 (×2): 75 mg via ORAL
  Filled 2019-12-30 (×2): qty 1

## 2019-12-30 MED ORDER — SODIUM CHLORIDE 0.9% FLUSH: 3.0000 mL | Freq: Two times a day (BID) | INTRAVENOUS | Status: DC

## 2019-12-30 MED ORDER — PANTOPRAZOLE SODIUM 40 MG PO TBEC
40.0000 mg | DELAYED_RELEASE_TABLET | Freq: Every day | ORAL | Status: DC
  Administered 2019-12-31 – 2020-01-01 (×2): 40 mg via ORAL
  Filled 2019-12-30 (×2): qty 1

## 2019-12-30 MED ORDER — ACETAMINOPHEN 650 MG RE SUPP: 650.0000 mg | Freq: Four times a day (QID) | RECTAL | Status: DC | PRN

## 2019-12-30 MED ORDER — APIXABAN 5 MG PO TABS
5.0000 mg | ORAL_TABLET | Freq: Two times a day (BID) | ORAL | Status: DC
  Administered 2019-12-30 – 2020-01-01 (×4): 5 mg via ORAL
  Filled 2019-12-30 (×4): qty 1

## 2019-12-30 MED ORDER — ACETAMINOPHEN 325 MG PO TABS: 650.0000 mg | ORAL_TABLET | Freq: Four times a day (QID) | ORAL | Status: DC | PRN

## 2019-12-30 MED ORDER — PRAVASTATIN SODIUM 20 MG PO TABS
10.0000 mg | ORAL_TABLET | Freq: Every day | ORAL | Status: DC
  Administered 2019-12-30 – 2019-12-31 (×2): 10 mg via ORAL
  Filled 2019-12-30: qty 1

## 2019-12-30 MED ORDER — BISACODYL 5 MG PO TBEC: 5.0000 mg | DELAYED_RELEASE_TABLET | Freq: Every day | ORAL | Status: DC | PRN

## 2019-12-30 MED ORDER — SODIUM CHLORIDE 0.9 % IV BOLUS
500.0000 mL | Freq: Once | INTRAVENOUS | Status: AC
  Administered 2019-12-30: 500 mL via INTRAVENOUS

## 2019-12-30 NOTE — ED Triage Notes (Signed)
Pt BIB EMS from [place] at [facility]. Facility reports pt has increased fatigue. Pt has hx of multiple strokes, dementia. EMS reports pt is confused at baseline.   CBG 213

## 2019-12-30 NOTE — H&P (Signed)
History and Physical    [name] [id number] DOB: 29-Jul-1922 DOA: 12/30/2019  PCP: Biagio Borg, MD   Patient coming from: [place] at [facility]   Chief Complaint: Increased fatigue and confusion, not eating or drinking   HPI: [name] is a 86 y.o. female with medical history significant for CVA, dementia, hypertension, hypothyroidism, and heart block with pacer, now presenting from her ILF with increased fatigue, increased confusion, and not eating or drinking.  Patient is accompanied by her daughter-in-law who assists with the history.  Patient has reportedly been more fatigued, sleeping more, requiring increased assistance, and not eating or drinking for the past 48 to 72 hours.  The patient has been less interactive, "spacing out" at times.  She has not been agitated and there has not been any shaking or seizure-like activity.  Patient has not expressed any specific complaints and there has not been any recent fall or trauma.  She has not appeared short of breath and no fevers have been reported.  At her baseline, the patient feeds herself, requires assistance ambulating with a walker, sometimes using wheelchair, and requires some assistance with transfers.  She is confused at baseline.  ED Course: Upon arrival to the ED, patient is found to be afebrile, saturating mid 90s on room air, and with stable blood pressure.  EKG features sinus rhythm with IVCD, LVH with repolarization abnormality, and QTc interval of 567 ms.  Chest x-rays negative for acute cardiopulmonary disease and no acute intracranial abnormality noted on head CT.  Chemistry panel and CBC are unremarkable.  Lactic acid 2.0, down to 1.3 after IV fluids, and high-sensitivity troponin mildly elevated and flat (28 x2).  Covid and influenza PCR are negative.  Blood culture was collected in the ED, 500 cc normal saline administered, and urinalysis ordered.  Review of Systems:  Unable to complete ROS secondary  the patient's clinical condition.  Past Medical History:  Diagnosis Date  . Aphasia 01/09/2009  . Arthritis    right hand  . 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 CEREBROVASCULAR ACCIDENT, HX OF 11/20/2007  . CHF (congestive heart failure) ([place])   . Coronary artery disease   . CVA 01/09/2009  . DEMENTIA 05/19/2008  . Dementia ([place])   . DIVERTICULOSIS, COLON 11/20/2007  . FATIGUE 11/20/2007  . Femoral fracture ([place]) 07/01/2013  . GLUCOSE INTOLERANCE 12/06/2010  . HYPERLIPIDEMIA 11/20/2007  . HYPERTENSION 11/20/2007   Echo was done 07/07/11: mod LVH, EF 55-60%, grade 1 diast dysfxn, mild MR, mild LAE, mod TR, PASP 39  . HYPOTHYROIDISM 11/20/2007  . Impaired glucose tolerance 06/03/2011  . IRON DEFICIENCY 12/06/2010  . Pacemaker 2002  . PACEMAKER, PERMANENT 01/09/2009  . TIA 05/14/2003   elevated CE's in setting of TIA and falls in 10/12 - echo normal with no further cardiac w/u pursued  . TIA (transient ischemic attack) 10/10/2011  . Urinary incontinence 01/09/2009    Past Surgical History:  Procedure Laterality Date  . ABDOMINAL HYSTERECTOMY    . CHOLECYSTECTOMY    . CYSTOCELE REPAIR N/A 11/26/2012   Procedure: Cystocele Repair with Lefort Colpocleisis.  colpectomy Levatorplasty Perineorraphy;  Surgeon: Lovenia Kim, MD;  Location: [facility];  Service: Gynecology;  Laterality: N/A;  Cystocele Repair with Lefort Colpocleisis.  Marland Kitchen HIP ARTHROPLASTY Left 07/02/2013   Procedure: ARTHROPLASTY BIPOLAR HIP;  Surgeon: Mauri Pole, MD;  Location: [facility];  Service: Orthopedics;  Laterality: Left;  . INGUINAL HERNIA REPAIR Right 08/13/2013   Procedure: HERNIA REPAIR INGUINAL  INCARCERATED;  Surgeon: Ralene Ok, MD;  Location: [place];  Service: General;  Laterality: Right;  . Medtronic Cynthia dual chamber pacemaker serial number was [id number] H...04/15/2009    . PPM GENERATOR CHANGEOUT N/A 04/15/2019   Procedure: PPM GENERATOR CHANGEOUT;  Surgeon: Evans Lance, MD;  Location: [facility];  Service:  Cardiovascular;  Laterality: N/A;  . s/p cerebral aneurysm  1991  . s/p pacemaker DDD    . s/p retinal attachment    . s/p thyroidectomy       reports that she has never smoked. She has never used smokeless tobacco. She reports that she does not drink alcohol or use drugs.  Allergies  Allergen Reactions  . Codeine Rash    Family History  Problem Relation Age of Onset  . Cancer Mother        breast cancer     Prior to Admission medications   Medication Sig Start Date End Date Taking? Authorizing Provider  ALPRAZolam Duanne Moron) 0.25 MG tablet Take 0.25 mg by mouth daily as needed for anxiety.   Yes [provider]  apixaban (ELIQUIS) 5 MG TABS tablet Take 1 tablet (5 mg total) by mouth 2 (two) times daily. 09/03/19  Yes Evans Lance, MD  Calcium Carbonate-Vitamin D (CALTRATE 600+D) 600-400 MG-UNIT per tablet Take 1 tablet by mouth at bedtime.    Yes [provider]  docusate sodium (COLACE) 100 MG capsule Take 100 mg by mouth daily.   Yes [provider]  donepezil (ARICEPT) 10 MG tablet TAKE 1 TABLET(10 MG) BY MOUTH DAILY 09/03/19  Yes Biagio Borg, MD  furosemide (LASIX) 40 MG tablet Take 40 mg by mouth daily.   Yes [provider]  levothyroxine (SYNTHROID) 125 MCG tablet TAKE 1 TABLET(125 MCG) BY MOUTH DAILY 09/03/19  Yes Biagio Borg, MD  Multiple Vitamins-Minerals (CENTRUM SILVER 50+WOMEN) TABS Take 1 tablet by mouth daily.   Yes [provider]  pantoprazole (PROTONIX) 40 MG tablet Take 1 tablet (40 mg total) by mouth daily. 09/03/19  Yes Biagio Borg, MD  polyvinyl alcohol (LIQUIFILM TEARS) 1.4 % ophthalmic solution Place 1 drop into both eyes in the morning, at noon, in the evening, and at bedtime.    Yes [provider]  pravastatin (PRAVACHOL) 10 MG tablet Take 1 tablet (10 mg total) by mouth at bedtime. 09/03/19  Yes Biagio Borg, MD  psyllium (REGULOID) 0.52 g capsule Take 0.52 g by mouth daily.   Yes [provider]  valsartan (DIOVAN) 80 MG tablet Take 1 tablet (80 mg total) by mouth daily. Take 80 mg by mouth daily. 09/03/19  Yes Biagio Borg, MD  furosemide (LASIX) 40 MG tablet Take 1 tablet (40 mg total) by mouth daily. 08/29/19 11/27/19  Evans Lance, MD    Physical Exam: Vitals:   12/30/19 1815 12/30/19 1930 12/30/19 2000 12/30/19 2045  BP: (!) 142/64 (!) 141/89 (!) 158/64 (!) 158/58  Pulse: 61 62 60 (!) 58  Resp: (!) 27 20 18    Temp:      TempSrc:      SpO2: 93% 96% 97% 100%    Constitutional: NAD, calm  Eyes: PERTLA, lids and conjunctivae normal ENMT: Mucous membranes are moist. Posterior pharynx clear of any exudate or lesions.   Neck: normal, supple, no masses, no thyromegaly Respiratory: no wheezing, no crackles. No accessory muscle use.  Cardiovascular: S1 & S2 heard, regular rate and rhythm. No extremity edema.   Abdomen:  No distension, no tenderness, soft. Bowel sounds active.  Musculoskeletal: no clubbing / cyanosis. No joint deformity upper and lower extremities.   Skin: no significant rashes, lesions, ulcers. Warm, dry, well-perfused. Neurologic: No gross facial asymmetry. Sensation intact. Moving all extremities.  Psychiatric: Alert, makes eye-contact and smiles. Not answering orientation questions. Cooperative.    Labs and Imaging on Admission: I have personally reviewed following labs and imaging studies  CBC: Recent Labs  Lab 12/30/19 1748  WBC 5.2  NEUTROABS 2.6  HGB 15.4*  HCT 45.6  MCV 93.4  PLT 123456   Basic Metabolic Panel: Recent Labs  Lab 12/30/19 1748  NA 140  K 3.9  CL 105  CO2 25  GLUCOSE 107*  BUN 19  CREATININE 0.95  CALCIUM 8.9   GFR: CrCl cannot be calculated (Unknown ideal weight.). Liver Function Tests: Recent Labs  Lab 12/30/19 1748  AST 33  ALT 24  ALKPHOS 72  BILITOT 1.0  PROT 7.2  ALBUMIN 3.8   No results for input(s): LIPASE, AMYLASE in the last 168 hours. No results for input(s): AMMONIA in the last 168  hours. Coagulation Profile: Recent Labs  Lab 12/30/19 1748  INR 1.4*   Cardiac Enzymes: No results for input(s): CKTOTAL, CKMB, CKMBINDEX, TROPONINI in the last 168 hours. BNP (last 3 results) No results for input(s): PROBNP in the last 8760 hours. HbA1C: No results for input(s): HGBA1C in the last 72 hours. CBG: Recent Labs  Lab 12/30/19 1724  GLUCAP 117*   Lipid Profile: No results for input(s): CHOL, HDL, LDLCALC, TRIG, CHOLHDL, LDLDIRECT in the last 72 hours. Thyroid Function Tests: No results for input(s): TSH, T4TOTAL, FREET4, T3FREE, THYROIDAB in the last 72 hours. Anemia Panel: No results for input(s): VITAMINB12, FOLATE, FERRITIN, TIBC, IRON, RETICCTPCT in the last 72 hours. Urine analysis:    Component Value Date/Time   COLORURINE YELLOW 10/11/2019 0920   APPEARANCEUR CLEAR 10/11/2019 0920   LABSPEC 1.020 10/11/2019 0920   PHURINE 6.5 10/11/2019 0920   GLUCOSEU NEGATIVE 10/11/2019 0920   HGBUR NEGATIVE 10/11/2019 0920   BILIRUBINUR NEGATIVE 10/11/2019 0920   KETONESUR NEGATIVE 10/11/2019 0920   PROTEINUR 100 (A) 02/20/2019 1447   UROBILINOGEN 0.2 10/11/2019 0920   NITRITE NEGATIVE 10/11/2019 0920   LEUKOCYTESUR MODERATE (A) 10/11/2019 0920   Sepsis Labs: @LABRCNTIP (procalcitonin:4,lacticidven:4) ) Recent Results (from the past 240 hour(s))  Respiratory Panel by RT PCR (Flu A&B, Covid) - Nasopharyngeal Swab     Status: None   Collection Time: 12/30/19  5:56 PM   Specimen: Nasopharyngeal Swab  Result Value Ref Range Status   SARS Coronavirus 2 by RT PCR NEGATIVE NEGATIVE Final    Comment: (NOTE) SARS-CoV-2 target nucleic acids are NOT DETECTED. The SARS-CoV-2 RNA is generally detectable in upper respiratoy specimens during the acute phase of infection. The lowest concentration of SARS-CoV-2 viral copies this assay can detect is 131 copies/mL. A negative result does not preclude SARS-Cov-2 infection and should not be used as the sole basis for treatment  or other patient management decisions. A negative result may occur with  improper specimen collection/handling, submission of specimen other than nasopharyngeal swab, presence of viral mutation(s) within the areas targeted by this assay, and inadequate number of viral copies (<131 copies/mL). A negative result must be combined with clinical observations, patient history, and epidemiological information. The expected result is Negative. Fact Sheet for Patients:  PinkCheek.be Fact Sheet for Healthcare Providers:  GravelBags.it This test is not yet ap proved or cleared by the [place]  FDA and  has been authorized for detection and/or diagnosis of SARS-CoV-2 by FDA under an Emergency Use Authorization (EUA). This EUA will remain  in effect (meaning this test can be used) for the duration of the COVID-19 declaration under Section 564(b)(1) of the Act, 21 U.S.C. section 360bbb-3(b)(1), unless the authorization is terminated or revoked sooner.    Influenza A by PCR NEGATIVE NEGATIVE Final   Influenza B by PCR NEGATIVE NEGATIVE Final    Comment: (NOTE) The Xpert Xpress SARS-CoV-2/FLU/RSV assay is intended as an aid in  the diagnosis of influenza from Nasopharyngeal swab specimens and  should not be used as a sole basis for treatment. Nasal washings and  aspirates are unacceptable for Xpert Xpress SARS-CoV-2/FLU/RSV  testing. Fact Sheet for Patients: PinkCheek.be Fact Sheet for Healthcare Providers: GravelBags.it This test is not yet approved or cleared by the [place] FDA and  has been authorized for detection and/or diagnosis of SARS-CoV-2 by  FDA under an Emergency Use Authorization (EUA). This EUA will remain  in effect (meaning this test can be used) for the duration of the  Covid-19 declaration under Section 564(b)(1) of the Act, 21  U.S.C. section  360bbb-3(b)(1), unless the authorization is  terminated or revoked. Performed at [facility], [place] [address], [place], [place] [address]      Radiological Exams on Admission: CT Head Wo Contrast  Result Date: 12/30/2019 CLINICAL DATA:  Increased fatigue, dementia, multiple strokes, baseline confusion, hypertension, CHF, coronary artery disease, atrial fibrillation EXAM: CT HEAD WITHOUT CONTRAST TECHNIQUE: Contiguous axial images were obtained from the base of the skull through the vertex without intravenous contrast. Sagittal and coronal MPR images reconstructed from axial data set. COMPARISON:  11/13/2019 FINDINGS: Brain: Generalized atrophy. Prior LEFT frontotemporal craniotomy and aneurysm clipping. Slight prominence of the ventricular system unchanged. Small vessel chronic ischemic changes of deep cerebral white matter. No midline shift or mass effect. Large old infarcts in the LEFT MCA and LEFT ACA territories. Old LEFT cerebellar infarct. No intracranial hemorrhage, mass lesion, or evidence of acute infarction. No extra-axial fluid collection. Vascular: No hyperdense vessels. Atherosclerotic calcifications of internal carotid and vertebral arteries at skull base Skull: LEFT frontotemporal craniotomy. No acute calvarial abnormality. Sinuses/Orbits: Clear Other: N/A IMPRESSION: Old LEFT frontotemporal craniotomy and aneurysm clipping. Large old LEFT MCA and ACA territory infarcts. Atrophy with small vessel chronic ischemic changes of deep cerebral white matter. No acute intracranial abnormalities. Electronically Signed   By: Lavonia Dana M.D.   On: 12/30/2019 19:01   DG Chest Port 1 View  Result Date: 12/30/2019 CLINICAL DATA:  86 year old female with weakness. EXAM: PORTABLE CHEST 1 VIEW COMPARISON:  Chest radiograph dated 11/13/2019. FINDINGS: No focal consolidation, pleural effusion, pneumothorax. Stable cardiomegaly. Atherosclerotic calcification of the aorta. Left pectoral  pacemaker device. No acute osseous pathology. IMPRESSION: 1. No acute cardiopulmonary process. 2. Stable cardiomegaly. Electronically Signed   By: Anner Crete M.D.   On: 12/30/2019 18:27    EKG: Independently reviewed. Sinus rhythm, IVCD, LVH, repolarization abnormality, QTc 567 ms.   Assessment/Plan   1. Acute encephalopathy  - Presents with 3 days of fatigue, increased confusion, and not eating or drinking  - Family concerned for UTI, reporting similar presentation that was attributed to UTI and resolved with antibiotic  - No acute findings on head CT; basic labs in ED with elevations in lactate and troponin  - Check UA, check TSH, B12, ammonia, RPR, EEG; continue supportive care    2. History of CVA  -  No acute findings on CT head in ED  - Continue statin, Eliquis    3. Atrial fibrillation  - In sinus rhythm on admission  - CHADS-VASc 6 (age x2, CVA x2, gender, HTN) - Continue Eliquis    4. Hypertension  - BP at goal, continue ARB    5. Prolonged QT interval  - QTc is 567 ms in ED  - Continue cardiac monitoring, minimize QT-prolonging medications, check magnesium level    8. Hypothyroidism  - Check TSH as above, continue Synthroid    DVT prophylaxis: Eliquis  Code Status: Full  Family Communication: Daughter in law   Disposition Plan: Likely back to ILF within 24-48 hours if medically stable  Consults called: None  Admission status: Observation     Vianne Bulls, MD [facility] Pager: See [email or web address]  If 7AM-7PM, please contact the daytime attending [email or web address]  12/30/2019, 9:15 PM

## 2019-12-30 NOTE — ED Notes (Addendum)
RN attempted to call report, receiving RN asked for 10 min, will call back.

## 2019-12-30 NOTE — ED Provider Notes (Signed)
[facility] DEPT Provider Note   CSN: [id number] Arrival date & time: 12/30/19  1658     History Chief Complaint  Patient presents with  . Fatigue    AUTUM [name] is a 86 y.o. female.  86 year old female with prior medical history as detailed below presents for evaluation of decreased mental status.  Patient's family provides 58 of history.  Patient with decreased p.o. intake over the last 48 hours.  Patient is less responsive than her normal.  Patient does have dementia at baseline.  Level 5 caveat secondary to patient's dementia and current less responsive state.  Family is concerned about the possibility of occult UTI.  Patient does not have any specific complaint on my exam.  She is nonverbal with me.  No reported fevers, vomiting, reported pain, or other specific complaint per family.  The history is provided by the patient, a relative and medical records.  Illness Location:  AMS, decreased p.o. intake Severity:  Mild Onset quality:  Gradual Duration:  2 days Timing:  Constant Progression:  Worsening Chronicity:  New Associated symptoms: no fever        Past Medical History:  Diagnosis Date  . Aphasia 01/09/2009  . Arthritis    right hand  . 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 CEREBROVASCULAR ACCIDENT, HX OF 11/20/2007  . CHF (congestive heart failure) ([place])   . Coronary artery disease   . CVA 01/09/2009  . DEMENTIA 05/19/2008  . Dementia ([place])   . DIVERTICULOSIS, COLON 11/20/2007  . FATIGUE 11/20/2007  . Femoral fracture ([place]) 07/01/2013  . GLUCOSE INTOLERANCE 12/06/2010  . HYPERLIPIDEMIA 11/20/2007  . HYPERTENSION 11/20/2007   Echo was done 07/07/11: mod LVH, EF 55-60%, grade 1 diast dysfxn, mild MR, mild LAE, mod TR, PASP 39  . HYPOTHYROIDISM 11/20/2007  . Impaired glucose tolerance 06/03/2011  . IRON DEFICIENCY 12/06/2010  . Pacemaker 2002  . PACEMAKER, PERMANENT 01/09/2009  . TIA 05/14/2003   elevated CE's in setting of TIA  and falls in 10/12 - echo normal with no further cardiac w/u pursued  . TIA (transient ischemic attack) 10/10/2011  . Urinary incontinence 01/09/2009    Patient Active Problem List   Diagnosis Date Noted  . Acute encephalopathy 12/30/2019  . Bloody diarrhea 06/25/2019  . Pressure ulcer of sacral region, stage 2 ([place]) 02/21/2019  . Hypothermia 02/20/2019  . Dysphagia, post-stroke   . Global aphasia   . Acute right ACA stroke ([place]) 12/07/2018  . Stroke (cerebrum) ([place]) 12/03/2018  . Cough 10/11/2018  . Abnormal breath sounds 10/11/2018  . Closed nondisplaced fracture of proximal phalanx of lesser toe of right foot 06/26/2018  . Right ankle pain 06/21/2018  . Right foot pain 06/21/2018  . Abnormal CXR 04/26/2017  . Lobar pneumonia ([place]) 01/20/2017  . Acute on chronic respiratory failure with hypoxia ([place]) 01/17/2017  . Acute respiratory failure with hypoxia ([place]) 01/17/2017  . DOE (dyspnea on exertion) 10/23/2014  . Hyponatremia 10/23/2014  . RLL pneumonia 10/11/2013  . Other specified anemias 10/10/2013  . Acute respiratory failure ([place]) 08/15/2013  . Aspiration pneumonia ([place]) 08/15/2013  . Altered mental status 08/15/2013  . Incarcerated femoral hernia 08/14/2013  . Esophageal reflux 08/05/2013  . Unspecified constipation 07/03/2013  . Closed fracture of left hip ([place]) 07/01/2013  . 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 Atrial fibrillation ([place]) 10/30/2012  . Low back pain 10/26/2012  . Syncope 10/26/2012  . PNA (pneumonia) 09/26/2012  . Acute lower UTI 09/26/2012  .  Hypokalemia 09/26/2012  . Leukocytosis 09/26/2012  . Cardiac enzymes elevated 07/25/2011  . Impaired glucose tolerance 06/03/2011  . Preventative health care 06/03/2011  . IRON DEFICIENCY 12/06/2010  . 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 CVA (cerebral vascular accident) ([place]) 01/09/2009  . Aphasia 01/09/2009  . Urinary incontinence 01/09/2009  . PACEMAKER, PERMANENT 01/09/2009  .  Dementia ([place]) 05/19/2008  . Hypothyroidism 11/20/2007  . Hyperlipidemia 11/20/2007  . Essential hypertension 11/20/2007  . DIVERTICULOSIS, COLON 11/20/2007  . FATIGUE 11/20/2007  . CEREBROVASCULAR ACCIDENT, HX OF 11/20/2007  . TIA 05/14/2003    Past Surgical History:  Procedure Laterality Date  . ABDOMINAL HYSTERECTOMY    . CHOLECYSTECTOMY    . CYSTOCELE REPAIR N/A 11/26/2012   Procedure: Cystocele Repair with Lefort Colpocleisis.  colpectomy Levatorplasty Perineorraphy;  Surgeon: Lovenia Kim, MD;  Location: [facility];  Service: Gynecology;  Laterality: N/A;  Cystocele Repair with Lefort Colpocleisis.  Marland Kitchen HIP ARTHROPLASTY Left 07/02/2013   Procedure: ARTHROPLASTY BIPOLAR HIP;  Surgeon: Mauri Pole, MD;  Location: [facility];  Service: Orthopedics;  Laterality: Left;  . INGUINAL HERNIA REPAIR Right 08/13/2013   Procedure: HERNIA REPAIR INGUINAL INCARCERATED;  Surgeon: Ralene Ok, MD;  Location: [facility];  Service: General;  Laterality: Right;  . Medtronic Cynthia dual chamber pacemaker serial number was [id number]...04/15/2009    . PPM GENERATOR CHANGEOUT N/A 04/15/2019   Procedure: PPM GENERATOR CHANGEOUT;  Surgeon: Evans Lance, MD;  Location: [facility];  Service: Cardiovascular;  Laterality: N/A;  . s/p cerebral aneurysm  1991  . s/p pacemaker DDD    . s/p retinal attachment    . s/p thyroidectomy       OB History   No obstetric history on file.     Family History  Problem Relation Age of Onset  . Cancer Mother        breast cancer    Social History   Tobacco Use  . Smoking status: Never Smoker  . Smokeless tobacco: Never Used  Substance Use Topics  . Alcohol use: No  . Drug use: No    Home Medications Prior to Admission medications   Medication Sig Start Date End Date Taking? Authorizing Provider  ALPRAZolam Duanne Moron) 0.25 MG tablet Take 0.25 mg by mouth daily as needed for anxiety.   Yes [provider]  apixaban (ELIQUIS) 5 MG TABS tablet  Take 1 tablet (5 mg total) by mouth 2 (two) times daily. 09/03/19  Yes Evans Lance, MD  Calcium Carbonate-Vitamin D (CALTRATE 600+D) 600-400 MG-UNIT per tablet Take 1 tablet by mouth at bedtime.    Yes [provider]  docusate sodium (COLACE) 100 MG capsule Take 100 mg by mouth daily.   Yes [provider]  donepezil (ARICEPT) 10 MG tablet TAKE 1 TABLET(10 MG) BY MOUTH DAILY 09/03/19  Yes Biagio Borg, MD  furosemide (LASIX) 40 MG tablet Take 40 mg by mouth daily.   Yes [provider]  levothyroxine (SYNTHROID) 125 MCG tablet TAKE 1 TABLET(125 MCG) BY MOUTH DAILY 09/03/19  Yes Biagio Borg, MD  Multiple Vitamins-Minerals (CENTRUM SILVER 50+WOMEN) TABS Take 1 tablet by mouth daily.   Yes [provider]  pantoprazole (PROTONIX) 40 MG tablet Take 1 tablet (40 mg total) by mouth daily. 09/03/19  Yes Biagio Borg, MD  polyvinyl alcohol (LIQUIFILM TEARS) 1.4 % ophthalmic solution Place 1 drop into both eyes in the morning, at noon, in the evening, and at bedtime.    Yes [provider]  pravastatin (PRAVACHOL) 10 MG tablet Take 1 tablet (10 mg total) by mouth at bedtime. 09/03/19  Yes Biagio Borg, MD  psyllium (REGULOID) 0.52 g capsule Take 0.52 g by mouth daily.   Yes [provider]  valsartan (DIOVAN) 80 MG tablet Take 1 tablet (80 mg total) by mouth daily. Take 80 mg by mouth daily. 09/03/19  Yes Biagio Borg, MD  furosemide (LASIX) 40 MG tablet Take 1 tablet (40 mg total) by mouth daily. 08/29/19 11/27/19  Evans Lance, MD    Allergies    Codeine  Review of Systems   Review of Systems  Constitutional: Negative for fever.  All other systems reviewed and are negative.   Physical Exam Updated Vital Signs BP (!) 158/64   Pulse 60   Temp 97.7 F (36.5 C) (Oral)   Resp 18   SpO2 97%   Physical Exam Vitals and nursing note reviewed.  Constitutional:      General: She is not in acute distress.    Appearance: She is  well-developed.     Comments: Nonverbal with this examiner  Dry mucous membranes  HENT:     Head: Normocephalic and atraumatic.  Eyes:     Conjunctiva/sclera: Conjunctivae normal.     Pupils: Pupils are equal, round, and reactive to light.  Cardiovascular:     Rate and Rhythm: Normal rate and regular rhythm.     Heart sounds: Normal heart sounds.  Pulmonary:     Effort: Pulmonary effort is normal. No respiratory distress.     Breath sounds: Normal breath sounds.  Abdominal:     General: There is no distension.     Palpations: Abdomen is soft.     Tenderness: There is no abdominal tenderness.  Musculoskeletal:        General: No deformity. Normal range of motion.     Cervical back: Normal range of motion and neck supple.  Skin:    General: Skin is warm and dry.  Neurological:     Mental Status: She is alert.     Comments: Nonverbal, awake  No focal deficit      ED Results / Procedures / Treatments   Labs (all labs ordered are listed, but only abnormal results are displayed) Labs Reviewed  COMPREHENSIVE METABOLIC PANEL - Abnormal; Notable for the following components:      Result Value   Glucose, Bld 107 (*)    GFR calc non Af Amer 51 (*)    GFR calc Af Amer 59 (*)    All other components within normal limits  LACTIC ACID, PLASMA - Abnormal; Notable for the following components:   Lactic Acid, Venous 2.0 (*)    All other components within normal limits  CBC WITH DIFFERENTIAL/PLATELET - Abnormal; Notable for the following components:   Hemoglobin 15.4 (*)    All other components within normal limits  PROTIME-INR - Abnormal; Notable for the following components:   Prothrombin Time 16.9 (*)    INR 1.4 (*)    All other components within normal limits  CBG MONITORING, ED - Abnormal; Notable for the following components:   Glucose-Capillary 117 (*)    All other components within normal limits  TROPONIN I (HIGH SENSITIVITY) - Abnormal; Notable for the following  components:   Troponin I (High Sensitivity) 28 (*)    All other components within normal limits  TROPONIN I (HIGH SENSITIVITY) - Abnormal; Notable for the following components:   Troponin I (High Sensitivity)  28 (*)    All other components within normal limits  RESPIRATORY PANEL BY RT PCR (FLU A&B, COVID)  CULTURE, BLOOD (ROUTINE X 2)  CULTURE, BLOOD (ROUTINE X 2)  LACTIC ACID, PLASMA  URINALYSIS, ROUTINE W REFLEX MICROSCOPIC    EKG EKG Interpretation  Date/Time:  Monday December 30 2019 17:20:47 EDT Ventricular Rate:  60 PR Interval:    QRS Duration: 202 QT Interval:  567 QTC Calculation: 567 R Axis:   -73 Text Interpretation: Sinus rhythm IVCD, consider atypical RBBB LVH with IVCD, LAD and secondary repol abnrm Prolonged QT interval Confirmed by Dene Gentry [phone]) on 12/30/2019 6:16:53 PM   Radiology CT Head Wo Contrast  Result Date: 12/30/2019 CLINICAL DATA:  Increased fatigue, dementia, multiple strokes, baseline confusion, hypertension, CHF, coronary artery disease, atrial fibrillation EXAM: CT HEAD WITHOUT CONTRAST TECHNIQUE: Contiguous axial images were obtained from the base of the skull through the vertex without intravenous contrast. Sagittal and coronal MPR images reconstructed from axial data set. COMPARISON:  11/13/2019 FINDINGS: Brain: Generalized atrophy. Prior LEFT frontotemporal craniotomy and aneurysm clipping. Slight prominence of the ventricular system unchanged. Small vessel chronic ischemic changes of deep cerebral white matter. No midline shift or mass effect. Large old infarcts in the LEFT MCA and LEFT ACA territories. Old LEFT cerebellar infarct. No intracranial hemorrhage, mass lesion, or evidence of acute infarction. No extra-axial fluid collection. Vascular: No hyperdense vessels. Atherosclerotic calcifications of internal carotid and vertebral arteries at skull base Skull: LEFT frontotemporal craniotomy. No acute calvarial abnormality. Sinuses/Orbits: Clear  Other: N/A IMPRESSION: Old LEFT frontotemporal craniotomy and aneurysm clipping. Large old LEFT MCA and ACA territory infarcts. Atrophy with small vessel chronic ischemic changes of deep cerebral white matter. No acute intracranial abnormalities. Electronically Signed   By: Lavonia Dana M.D.   On: 12/30/2019 19:01   DG Chest Port 1 View  Result Date: 12/30/2019 CLINICAL DATA:  86 year old female with weakness. EXAM: PORTABLE CHEST 1 VIEW COMPARISON:  Chest radiograph dated 11/13/2019. FINDINGS: No focal consolidation, pleural effusion, pneumothorax. Stable cardiomegaly. Atherosclerotic calcification of the aorta. Left pectoral pacemaker device. No acute osseous pathology. IMPRESSION: 1. No acute cardiopulmonary process. 2. Stable cardiomegaly. Electronically Signed   By: Anner Crete M.D.   On: 12/30/2019 18:27    Procedures Procedures (including critical care time)  Medications Ordered in ED Medications  0.9 %  sodium chloride infusion (has no administration in time range)  sodium chloride 0.9 % bolus 500 mL (500 mLs Intravenous Bolus from Bag 12/30/19 1932)    ED Course  I have reviewed the triage vital signs and the nursing notes.  Pertinent labs & imaging results that were available during my care of the patient were reviewed by me and considered in my medical decision making (see chart for details).    MDM Rules/Calculators/A&P                      MDM  Screen complete  SHERYE JANIS was evaluated in Emergency Department on 12/30/2019 for the symptoms described in the history of present illness. She was evaluated in the context of the global COVID-19 pandemic, which necessitated consideration that the patient might be at risk for infection with the SARS-CoV-2 virus that causes COVID-19. Institutional protocols and algorithms that pertain to the evaluation of patients at risk for COVID-19 are in a state of rapid change based on information released by regulatory bodies including  the CDC and federal and state organizations. These policies and algorithms were followed  during the patient's care in the ED.  Patient is presenting for evaluation of reported decreased p.o. intake and increasing confusion from baseline.  Patient's exam is suggestive of the very least of mild dehydration.  Work-up suggest the same.  Patient would likely benefit from overnight observation for further IV fluids and work-up.  Hospital services were case and will evaluate for admission.  Final Clinical Impression(s) / ED Diagnoses Final diagnoses:  Altered mental status, unspecified altered mental status type  Dehydration    Rx / DC Orders ED Discharge Orders    None       Valarie Merino, MD 12/30/19 2041

## 2019-12-31 ENCOUNTER — Inpatient Hospital Stay (HOSPITAL_COMMUNITY)
Admit: 2019-12-31 | Discharge: 2019-12-31 | Disposition: A | Discharge status: Home / Self Care | Payer: Medicare Other | Attending: Family Medicine | Admitting: Family Medicine

## 2019-12-31 DIAGNOSIS — I480 Paroxysmal atrial fibrillation: Secondary | ICD-10-CM | POA: Diagnosis not present

## 2019-12-31 DIAGNOSIS — Z96642 Presence of left artificial hip joint: Secondary | ICD-10-CM | POA: Diagnosis present

## 2019-12-31 DIAGNOSIS — I4891 Unspecified atrial fibrillation: Secondary | ICD-10-CM | POA: Diagnosis present

## 2019-12-31 DIAGNOSIS — I442 Atrioventricular block, complete: Secondary | ICD-10-CM | POA: Diagnosis present

## 2019-12-31 DIAGNOSIS — R1310 Dysphagia, unspecified: Secondary | ICD-10-CM | POA: Diagnosis present

## 2019-12-31 DIAGNOSIS — Z803 Family history of malignant neoplasm of breast: Secondary | ICD-10-CM | POA: Diagnosis not present

## 2019-12-31 DIAGNOSIS — I69320 Aphasia following cerebral infarction: Secondary | ICD-10-CM | POA: Diagnosis not present

## 2019-12-31 DIAGNOSIS — Z7901 Long term (current) use of anticoagulants: Secondary | ICD-10-CM | POA: Diagnosis not present

## 2019-12-31 DIAGNOSIS — R7302 Impaired glucose tolerance (oral): Secondary | ICD-10-CM | POA: Diagnosis present

## 2019-12-31 DIAGNOSIS — I2510 Atherosclerotic heart disease of native coronary artery without angina pectoris: Secondary | ICD-10-CM | POA: Diagnosis present

## 2019-12-31 DIAGNOSIS — E785 Hyperlipidemia, unspecified: Secondary | ICD-10-CM | POA: Diagnosis present

## 2019-12-31 DIAGNOSIS — Z79890 Hormone replacement therapy (postmenopausal): Secondary | ICD-10-CM | POA: Diagnosis not present

## 2019-12-31 DIAGNOSIS — F0390 Unspecified dementia without behavioral disturbance: Secondary | ICD-10-CM | POA: Diagnosis present

## 2019-12-31 DIAGNOSIS — Z950 Presence of cardiac pacemaker: Secondary | ICD-10-CM | POA: Diagnosis not present

## 2019-12-31 DIAGNOSIS — E876 Hypokalemia: Secondary | ICD-10-CM | POA: Diagnosis present

## 2019-12-31 DIAGNOSIS — G9340 Encephalopathy, unspecified: Secondary | ICD-10-CM | POA: Diagnosis not present

## 2019-12-31 DIAGNOSIS — K219 Gastro-esophageal reflux disease without esophagitis: Secondary | ICD-10-CM | POA: Diagnosis present

## 2019-12-31 DIAGNOSIS — I10 Essential (primary) hypertension: Secondary | ICD-10-CM | POA: Diagnosis present

## 2019-12-31 DIAGNOSIS — R9401 Abnormal electroencephalogram [EEG]: Secondary | ICD-10-CM

## 2019-12-31 DIAGNOSIS — Z8673 Personal history of transient ischemic attack (TIA), and cerebral infarction without residual deficits: Secondary | ICD-10-CM | POA: Diagnosis not present

## 2019-12-31 DIAGNOSIS — G9341 Metabolic encephalopathy: Secondary | ICD-10-CM | POA: Diagnosis present

## 2019-12-31 DIAGNOSIS — N390 Urinary tract infection, site not specified: Secondary | ICD-10-CM | POA: Diagnosis present

## 2019-12-31 DIAGNOSIS — Z90710 Acquired absence of both cervix and uterus: Secondary | ICD-10-CM | POA: Diagnosis not present

## 2019-12-31 DIAGNOSIS — E860 Dehydration: Secondary | ICD-10-CM | POA: Diagnosis present

## 2019-12-31 DIAGNOSIS — Z9049 Acquired absence of other specified parts of digestive tract: Secondary | ICD-10-CM | POA: Diagnosis not present

## 2019-12-31 DIAGNOSIS — Z20822 Contact with and (suspected) exposure to covid-19: Secondary | ICD-10-CM | POA: Diagnosis present

## 2019-12-31 DIAGNOSIS — I69391 Dysphagia following cerebral infarction: Secondary | ICD-10-CM | POA: Diagnosis not present

## 2019-12-31 DIAGNOSIS — E039 Hypothyroidism, unspecified: Secondary | ICD-10-CM | POA: Diagnosis present

## 2019-12-31 LAB — BASIC METABOLIC PANEL
Anion gap: 9 (ref 5–15)
BUN: 18 mg/dL (ref 8–23)
CO2: 24 mmol/L (ref 22–32)
Calcium: 8.4 mg/dL — ABNORMAL LOW (ref 8.9–10.3)
Chloride: 109 mmol/L (ref 98–111)
Creatinine, Ser: 0.95 mg/dL (ref 0.44–1.00)
GFR calc Af Amer: 59 mL/min — ABNORMAL LOW (ref >60)
GFR calc non Af Amer: 51 mL/min — ABNORMAL LOW (ref >60)
Glucose, Bld: 105 mg/dL — ABNORMAL HIGH (ref 70–99)
Potassium: 2.9 mmol/L — ABNORMAL LOW (ref 3.5–5.1)
Sodium: 142 mmol/L (ref 135–145)

## 2019-12-31 LAB — URINALYSIS, ROUTINE W REFLEX MICROSCOPIC
Bilirubin Urine: NEGATIVE
Glucose, UA: NEGATIVE mg/dL
Hgb urine dipstick: NEGATIVE
Ketones, ur: NEGATIVE mg/dL
Nitrite: NEGATIVE
Protein, ur: NEGATIVE mg/dL
Specific Gravity, Urine: 1.02 (ref 1.005–1.030)
WBC, UA: >50 WBC/hpf — ABNORMAL HIGH (ref 0–5)
pH: 7 (ref 5.0–8.0)

## 2019-12-31 LAB — TSH: TSH: 0.457 u[IU]/mL (ref 0.350–4.500)

## 2019-12-31 LAB — HIV ANTIBODY (ROUTINE TESTING W REFLEX): HIV Screen 4th Generation wRfx: NONREACTIVE

## 2019-12-31 LAB — VITAMIN B12: Vitamin B-12: 560 pg/mL (ref 180–914)

## 2019-12-31 LAB — CBC
HCT: 40.7 % (ref 36.0–46.0)
Hemoglobin: 13.7 g/dL (ref 12.0–15.0)
MCH: 31.4 pg (ref 26.0–34.0)
MCHC: 33.7 g/dL (ref 30.0–36.0)
MCV: 93.1 fL (ref 80.0–100.0)
Platelets: 219 10*3/uL (ref 150–400)
RBC: 4.37 MIL/uL (ref 3.87–5.11)
RDW: 13.2 % (ref 11.5–15.5)
WBC: 4.6 10*3/uL (ref 4.0–10.5)
nRBC: 0 % (ref 0.0–0.2)

## 2019-12-31 LAB — RPR: RPR Ser Ql: NONREACTIVE

## 2019-12-31 LAB — MRSA PCR SCREENING: MRSA by PCR: NEGATIVE

## 2019-12-31 LAB — AMMONIA: Ammonia: 23 umol/L (ref 9–35)

## 2019-12-31 LAB — MAGNESIUM: Magnesium: 2.1 mg/dL (ref 1.7–2.4)

## 2019-12-31 MED ORDER — SODIUM CHLORIDE 0.9 % IV SOLN
1.0000 g | Freq: Every day | INTRAVENOUS | Status: DC
  Administered 2019-12-31 – 2020-01-01 (×2): 1 g via INTRAVENOUS
  Filled 2019-12-31 (×2): qty 1

## 2019-12-31 MED ORDER — POTASSIUM CHLORIDE 10 MEQ/100ML IV SOLN
10.0000 meq | INTRAVENOUS | Status: AC
  Administered 2019-12-31 (×4): 10 meq via INTRAVENOUS
  Filled 2019-12-31 (×4): qty 100

## 2019-12-31 MED ORDER — POTASSIUM CHLORIDE IN NACL 40-0.9 MEQ/L-% IV SOLN
INTRAVENOUS | Status: DC
  Administered 2019-12-31 (×2): 75 mL/h via INTRAVENOUS
  Filled 2019-12-31 (×2): qty 1000

## 2019-12-31 NOTE — Procedures (Signed)
Patient Name: [name]  MRN: [id number]  Epilepsy Attending: Lora Havens  Referring Physician/Provider: Dr Mitzi Hansen Date: 12/31/2019 Duration: 23.07 mins  Patient history: 86yo F with ams. EEG to evaluate for seizure  Level of alertness: awake  AEDs during EEG study: None  Technical aspects: This EEG study was done with scalp electrodes positioned according to the 10-20 International system of electrode placement. Electrical activity was acquired at a sampling rate of 500Hz  and reviewed with a high frequency filter of 70Hz  and a low frequency filter of 1Hz . EEG data were recorded continuously and digitally stored.   DESCRIPTION:  The posterior dominant rhythm consists of 8-9 Hz activity of moderate voltage (25-35 uV) seen predominantly in posterior head regions, asymmetric ( R>L) and reactive to eye opening and eye closing. EEG showed continuous generalized rhythmic 3-5hz  theta-delta slowing in left hemisphere, maximal left frontal region which at times appeared sharply contoured. Hyperventilation and photic stimulation were not performed.  ABNORMALITY - Continuous slow, left hemisphere, maximal left frontal region  IMPRESSION: This study is suggestive of cortical dysfunction in left hemisphere, maximal left frontal region likely secondary to underlying structural abnormality.  No seizures or definite epileptiform discharges were seen throughout the recording.  Rahcel Shutes Barbra Sarks

## 2019-12-31 NOTE — Telephone Encounter (Signed)
Called pt dtr and reviewed chart. Pt was admitted today. Spoke to dtr and informed that they will be able to treat her symptoms for the UTI found in the UA. Pt dtr stated understanding.

## 2019-12-31 NOTE — Progress Notes (Signed)
Progress Note    ARLINGTON RILE [id number] DOB: 01-Oct-1921 DOA: 12/30/2019  PCP: Biagio Borg, MD   Patient coming from: [place] at [facility]  Chief Complaint: Increased fatigue and confusion, not eating or drinking   HPI: [name] is a 86 y.o. female with medical history significant for CVA, dementia, hypertension, hypothyroidism, and heart block with pacer, now presenting from her ILF with increased fatigue, increased confusion, and not eating or drinking.  Patient is accompanied by her daughter-in-law who assists with the history.  Patient has reportedly been more fatigued, sleeping more, requiring increased assistance, and not eating or drinking for the past 48 to 72 hours.  The patient has been less interactive, "spacing out" at times.  She has not been agitated and there has not been any shaking or seizure-like activity.  Patient has not expressed any specific complaints and there has not been any recent fall or trauma.  She has not appeared short of breath and no fevers have been reported.  At her baseline, the patient feeds herself, requires assistance ambulating with a walker, sometimes using wheelchair, and requires some assistance with transfers.  She is confused at baseline. Upon arrival to the ED, patient is found to be afebrile, saturating mid 90s on room air, and with stable blood pressure.  EKG features sinus rhythm with IVCD, LVH with repolarization abnormality, and QTc interval of 567 ms.  Chest x-rays negative for acute cardiopulmonary disease and no acute intracranial abnormality noted on head CT.  Chemistry panel and CBC are unremarkable.  Lactic acid 2.0, down to 1.3 after IV fluids, and high-sensitivity troponin mildly elevated and flat (28 x2). Covid and influenza PCR are negative. Blood culture was collected in the ED, 500 cc normal saline administered, and urinalysis ordered.  Subjective: No acute issues or events overnight, patient  appears to be more awake and alert than previously noted although orientation is still somewhat difficult given patient's age and baseline mental status the setting of dementia.  Assessment/Plan  Principal Problem:   Acute encephalopathy Active Problems:   Hypothyroidism   Dementia (HCC)   Essential hypertension   History of completed stroke   Atrial fibrillation (HCC)   Acute encephalopathy on chronic dementia, rule out toxic vs metabolic, POA Cannot rule out UTI Rule out polypharmacy - Presents with 3 days of fatigue, increased confusion, and not eating or drinking  -Patient's baseline 9 is awake alert and pleasant - difficulty answering orientation questions but feeds herself requires assistance with ambulating with walker wheelchair and transfers. - Family questioning UTI - we will start ceftriaxone for 3 days to cover given UA with elevated white count and symptoms - No acute findings on head CT; basic labs in ED with elevations in lactate and troponin  - EEG pending per admission -TSH within normal limits, ammonia within normal limits -Advance diet as tolerated - may require speech evaluation to ensure she can tolerate p.o. safely -Hold all CNS depressants, discontinue patient's benzodiazepine, per beers criteria this is a high risk medication given patient's age  History of CVA  - No acute findings on CT head in ED  - Continue statin, Eliquis    Hypokalemia -Downtrending since admission, likely the setting of poor p.o. intake, continue IV fluids with added potassium -P.o. intake as tolerated  Atrial fibrillation, rate controlled - In sinus rhythm on admission  - CHADS-VASc 6 (age x2, CVA x2, gender, HTN) - Continue Eliquis    Hypertension  - BP  at goal, continue ARB    Prolonged QT interval  - QTc is 567 ms in ED  - Continue cardiac monitoring, minimize QT-prolonging medications, check magnesium level    Hypothyroidism  -TSH within normal limits, continue Synthroid     DVT prophylaxis: Eliquis  Code Status: Full  Family Communication: Attempted to call son x2 Disposition Plan: Likely back to ILF within the next 48 hours if medically stable Consults called: None  Admission status: Change to inpatient given mental status changes, need for IVF, IV antibiotics, and close monitoring for polypharmacy and acute delirium. High risk for decompensation given advanced age and multiple comorbidities as outlined above.   Physical Exam: Vitals:   12/30/19 2130 12/30/19 2204 12/31/19 0202 12/31/19 0606  BP: (!) 152/58 (!) 153/62 (!) 144/59 (!) 152/62  Pulse: 60 61 (!) 58 (!) 58  Resp: 18 20 18 20   Temp:  97.6 F (36.4 C) 97.9 F (36.6 C) 97.6 F (36.4 C)  TempSrc:    Oral  SpO2: 99% 95% 95% 95%    General:  Pleasantly resting in bed, No acute distress.  Alert to person only HEENT:  Normocephalic atraumatic.  Sclerae nonicteric, noninjected.  Extraocular movements intact bilaterally. Neck:  Without mass or deformity.  Trachea is midline. Lungs:  Clear to auscultate bilaterally without rhonchi, wheeze, or rales. Heart:  Regular rate and rhythm.  Without murmurs, rubs, or gallops. Abdomen:  Soft, nontender, nondistended.  Without guarding or rebound. Extremities: Without cyanosis, clubbing, edema, or obvious deformity. Vascular:  Dorsalis pedis and posterior tibial pulses palpable bilaterally. Skin:  Warm and dry, no erythema, no ulcerations.   Labs and Imaging on Admission: I have personally reviewed following labs and imaging studies  CBC: Recent Labs  Lab 12/30/19 1748 12/31/19 0519  WBC 5.2 4.6  NEUTROABS 2.6  --   HGB 15.4* 13.7  HCT 45.6 40.7  MCV 93.4 93.1  PLT 255 [id number]   Basic Metabolic Panel: Recent Labs  Lab 12/30/19 1748 12/31/19 0519  NA 140 142  K 3.9 2.9*  CL 105 109  CO2 25 24  GLUCOSE 107* 105*  BUN 19 18  CREATININE 0.95 0.95  CALCIUM 8.9 8.4*  MG  --  2.1   GFR: CrCl cannot be calculated (Unknown ideal  weight.). Liver Function Tests: Recent Labs  Lab 12/30/19 1748  AST 33  ALT 24  ALKPHOS 72  BILITOT 1.0  PROT 7.2  ALBUMIN 3.8   No results for input(s): LIPASE, AMYLASE in the last 168 hours. Recent Labs  Lab 12/31/19 0519  AMMONIA 23   Coagulation Profile: Recent Labs  Lab 12/30/19 1748  INR 1.4*   Cardiac Enzymes: No results for input(s): CKTOTAL, CKMB, CKMBINDEX, TROPONINI in the last 168 hours. BNP (last 3 results) No results for input(s): PROBNP in the last 8760 hours. HbA1C: No results for input(s): HGBA1C in the last 72 hours. CBG: Recent Labs  Lab 12/30/19 1724  GLUCAP 117*   Lipid Profile: No results for input(s): CHOL, HDL, LDLCALC, TRIG, CHOLHDL, LDLDIRECT in the last 72 hours. Thyroid Function Tests: Recent Labs    12/31/19 0519  TSH 0.457   Anemia Panel: Recent Labs    12/31/19 0519  VITAMINB12 560   Urine analysis:    Component Value Date/Time   COLORURINE YELLOW 12/30/2019 1727   APPEARANCEUR HAZY (A) 12/30/2019 1727   LABSPEC 1.020 12/30/2019 1727   PHURINE 7.0 12/30/2019 1727   GLUCOSEU NEGATIVE 12/30/2019 1727   [place] 10/11/2019 0920  HGBUR NEGATIVE 12/30/2019 [place] 12/30/2019 1727   KETONESUR NEGATIVE 12/30/2019 1727   PROTEINUR NEGATIVE 12/30/2019 1727   UROBILINOGEN 0.2 10/11/2019 0920   NITRITE NEGATIVE 12/30/2019 1727   LEUKOCYTESUR LARGE (A) 12/30/2019 1727    Recent Results (from the past 240 hour(s))  Culture, blood (routine x 2)     Status: None (Preliminary result)   Collection Time: 12/30/19  5:48 PM   Specimen: BLOOD LEFT FOREARM  Result Value Ref Range Status   Specimen Description   Final    BLOOD LEFT FOREARM Performed at [facility], [place] [address] [place], [place] [address]    Special Requests   Final    BOTTLES DRAWN AEROBIC ONLY Blood Culture results may not be optimal due to an inadequate volume of blood received in culture bottles Performed at  [facility] [address], [place], [place] [address]    Culture   Final    NO GROWTH < 12 HOURS Performed at [facility] [address], [place], [place] [address]    Report Status PENDING  Incomplete  Culture, blood (routine x 2)     Status: None (Preliminary result)   Collection Time: 12/30/19  5:50 PM   Specimen: BLOOD  Result Value Ref Range Status   Specimen Description   Final    BLOOD RIGHT ANTECUBITAL Performed at [facility] [address], [place], [place] [address]    Special Requests   Final    BOTTLES DRAWN AEROBIC AND ANAEROBIC Blood Culture adequate volume Performed at [facility] [address] [place], [place] [address]    Culture   Final    NO GROWTH < 12 HOURS Performed at [facility] [address] [place], [place] [address]    Report Status PENDING  Incomplete  Respiratory Panel by RT PCR (Flu A&B, Covid) - Nasopharyngeal Swab     Status: None   Collection Time: 12/30/19  5:56 PM   Specimen: Nasopharyngeal Swab  Result Value Ref Range Status   SARS Coronavirus 2 by RT PCR NEGATIVE NEGATIVE Final    Comment: (NOTE) SARS-CoV-2 target nucleic acids are NOT DETECTED. The SARS-CoV-2 RNA is generally detectable in upper respiratoy specimens during the acute phase of infection. The lowest concentration of SARS-CoV-2 viral copies this assay can detect is 131 copies/mL. A negative result does not preclude SARS-Cov-2 infection and should not be used as the sole basis for treatment or other patient management decisions. A negative result may occur with  improper specimen collection/handling, submission of specimen other than nasopharyngeal swab, presence of viral mutation(s) within the areas targeted by this assay, and inadequate number of viral copies (<131 copies/mL). A negative result must be combined with clinical observations, patient history, and epidemiological information.  The expected result is Negative. Fact Sheet for Patients:  PinkCheek.be Fact Sheet for Healthcare Providers:  GravelBags.it This test is not yet ap proved or cleared by the [place] FDA and  has been authorized for detection and/or diagnosis of SARS-CoV-2 by FDA under an Emergency Use Authorization (EUA). This EUA will remain  in effect (meaning this test can be used) for the duration of the COVID-19 declaration under Section 564(b)(1) of the Act, 21 U.S.C. section 360bbb-3(b)(1), unless the authorization is terminated or revoked sooner.    Influenza A by PCR NEGATIVE NEGATIVE Final   Influenza B by PCR NEGATIVE NEGATIVE Final    Comment: (NOTE) The  Xpert Xpress SARS-CoV-2/FLU/RSV assay is intended as an aid in  the diagnosis of influenza from Nasopharyngeal swab specimens and  should not be used as a sole basis for treatment. Nasal washings and  aspirates are unacceptable for Xpert Xpress SARS-CoV-2/FLU/RSV  testing. Fact Sheet for Patients: PinkCheek.be Fact Sheet for Healthcare Providers: GravelBags.it This test is not yet approved or cleared by the [place] FDA and  has been authorized for detection and/or diagnosis of SARS-CoV-2 by  FDA under an Emergency Use Authorization (EUA). This EUA will remain  in effect (meaning this test can be used) for the duration of the  Covid-19 declaration under Section 564(b)(1) of the Act, 21  U.S.C. section 360bbb-3(b)(1), unless the authorization is  terminated or revoked. Performed at [facility], [place] [address], [place], [place] [address]      Radiological Exams on Admission: CT Head Wo Contrast  Result Date: 12/30/2019 CLINICAL DATA:  Increased fatigue, dementia, multiple strokes, baseline confusion, hypertension, CHF, coronary artery disease, atrial fibrillation EXAM: CT HEAD WITHOUT  CONTRAST TECHNIQUE: Contiguous axial images were obtained from the base of the skull through the vertex without intravenous contrast. Sagittal and coronal MPR images reconstructed from axial data set. COMPARISON:  11/13/2019 FINDINGS: Brain: Generalized atrophy. Prior LEFT frontotemporal craniotomy and aneurysm clipping. Slight prominence of the ventricular system unchanged. Small vessel chronic ischemic changes of deep cerebral white matter. No midline shift or mass effect. Large old infarcts in the LEFT MCA and LEFT ACA territories. Old LEFT cerebellar infarct. No intracranial hemorrhage, mass lesion, or evidence of acute infarction. No extra-axial fluid collection. Vascular: No hyperdense vessels. Atherosclerotic calcifications of internal carotid and vertebral arteries at skull base Skull: LEFT frontotemporal craniotomy. No acute calvarial abnormality. Sinuses/Orbits: Clear Other: N/A IMPRESSION: Old LEFT frontotemporal craniotomy and aneurysm clipping. Large old LEFT MCA and ACA territory infarcts. Atrophy with small vessel chronic ischemic changes of deep cerebral white matter. No acute intracranial abnormalities. Electronically Signed   By: Lavonia Dana M.D.   On: 12/30/2019 19:01   DG Chest Port 1 View  Result Date: 12/30/2019 CLINICAL DATA:  86 year old female with weakness. EXAM: PORTABLE CHEST 1 VIEW COMPARISON:  Chest radiograph dated 11/13/2019. FINDINGS: No focal consolidation, pleural effusion, pneumothorax. Stable cardiomegaly. Atherosclerotic calcification of the aorta. Left pectoral pacemaker device. No acute osseous pathology. IMPRESSION: 1. No acute cardiopulmonary process. 2. Stable cardiomegaly. Electronically Signed   By: Anner Crete M.D.   On: 12/30/2019 18:27    EKG: Independently reviewed. Sinus rhythm, IVCD, LVH, repolarization abnormality, QTc 567 ms.    Little Ishikawa, DO [facility] Pager: See [email or web address]  12/31/2019, 7:41 AM

## 2019-12-31 NOTE — Telephone Encounter (Signed)
Ok that sounds good, let me know if I need to do anything further

## 2019-12-31 NOTE — Progress Notes (Signed)
Offsite EEG completed; results pending. 

## 2019-12-31 NOTE — Progress Notes (Signed)
Pt arrived to unit via ed stretcher. VSS did not appear to be in distress. Bed in lowest position with floor mats in place. Call bell at reach no request at this time. Will continue to monitor.

## 2020-01-01 LAB — CBC
HCT: 43 % (ref 36.0–46.0)
Hemoglobin: 14.1 g/dL (ref 12.0–15.0)
MCH: 31.5 pg (ref 26.0–34.0)
MCHC: 32.8 g/dL (ref 30.0–36.0)
MCV: 96.2 fL (ref 80.0–100.0)
Platelets: 212 10*3/uL (ref 150–400)
RBC: 4.47 MIL/uL (ref 3.87–5.11)
RDW: 13.2 % (ref 11.5–15.5)
WBC: 7.9 10*3/uL (ref 4.0–10.5)
nRBC: 0 % (ref 0.0–0.2)

## 2020-01-01 LAB — COMPREHENSIVE METABOLIC PANEL
ALT: 53 U/L — ABNORMAL HIGH (ref 0–44)
AST: 52 U/L — ABNORMAL HIGH (ref 15–41)
Albumin: 3.3 g/dL — ABNORMAL LOW (ref 3.5–5.0)
Alkaline Phosphatase: 78 U/L (ref 38–126)
Anion gap: 9 (ref 5–15)
BUN: 17 mg/dL (ref 8–23)
CO2: 18 mmol/L — ABNORMAL LOW (ref 22–32)
Calcium: 8.6 mg/dL — ABNORMAL LOW (ref 8.9–10.3)
Chloride: 116 mmol/L — ABNORMAL HIGH (ref 98–111)
Creatinine, Ser: 0.86 mg/dL (ref 0.44–1.00)
GFR calc Af Amer: >60 mL/min (ref >60)
GFR calc non Af Amer: 57 mL/min — ABNORMAL LOW (ref >60)
Glucose, Bld: 145 mg/dL — ABNORMAL HIGH (ref 70–99)
Potassium: 4.7 mmol/L (ref 3.5–5.1)
Sodium: 143 mmol/L (ref 135–145)
Total Bilirubin: 1.4 mg/dL — ABNORMAL HIGH (ref 0.3–1.2)
Total Protein: 6 g/dL — ABNORMAL LOW (ref 6.5–8.1)

## 2020-01-01 MED ORDER — SODIUM CHLORIDE 0.9 % IV SOLN: INTRAVENOUS | Status: DC

## 2020-01-01 MED ORDER — HYDRALAZINE HCL 20 MG/ML IJ SOLN
10.0000 mg | Freq: Once | INTRAMUSCULAR | Status: AC
  Administered 2020-01-01: 10 mg via INTRAVENOUS
  Filled 2020-01-01: qty 1

## 2020-01-01 MED ORDER — CEPHALEXIN 250 MG PO CAPS: 250.0000 mg | ORAL_CAPSULE | Freq: Four times a day (QID) | ORAL | 0 refills | Status: AC

## 2020-01-01 NOTE — Progress Notes (Signed)
Pt only put out about 220ml  of urine on this shift. Pt has taken a couple of sips throughout the night of water. Bladder scan done and showed no urine retention. Will continue to monitor.

## 2020-01-01 NOTE — NC FL2 (Signed)
[place] LEVEL OF CARE SCREENING TOOL     IDENTIFICATION  Patient Name: [name] Birthdate: 05/23/1922 Sex: female Admission Date (Current Location): 12/30/2019  [facility] and [place] Number:  Herbalist and Address:  [facility],  [place], [place]      Provider Number: [id number]  Attending Physician Name and Address:  Little Ishikawa, MD  Relative Name and Phone Number:       Current Level of Care: Hospital Recommended Level of Care: [place] Prior Approval Number:    Date Approved/Denied:   PASRR Number:    Discharge Plan: Domiciliary (Rest home)([place])    Current Diagnoses: Patient Active Problem List   Diagnosis Date Noted  . Acute encephalopathy 12/30/2019  . Bloody diarrhea 06/25/2019  . Pressure ulcer of sacral region, stage 2 ([place]) 02/21/2019  . Hypothermia 02/20/2019  . Dysphagia, post-stroke   . Global aphasia   . Acute right ACA stroke ([place]) 12/07/2018  . Stroke (cerebrum) ([place]) 12/03/2018  . Cough 10/11/2018  . Abnormal breath sounds 10/11/2018  . Closed nondisplaced fracture of proximal phalanx of lesser toe of right foot 06/26/2018  . Right ankle pain 06/21/2018  . Right foot pain 06/21/2018  . Abnormal CXR 04/26/2017  . Lobar pneumonia ([place]) 01/20/2017  . Acute on chronic respiratory failure with hypoxia ([place]) 01/17/2017  . Acute respiratory failure with hypoxia ([place]) 01/17/2017  . DOE (dyspnea on exertion) 10/23/2014  . Hyponatremia 10/23/2014  . RLL pneumonia 10/11/2013  . Other specified anemias 10/10/2013  . Acute respiratory failure ([place]) 08/15/2013  . Aspiration pneumonia ([place]) 08/15/2013  . Altered mental status 08/15/2013  . Incarcerated femoral hernia 08/14/2013  . Esophageal reflux 08/05/2013  . Unspecified constipation 07/03/2013  . Closed fracture of left hip ([place]) 07/01/2013  . 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  Atrial fibrillation ([place]) 10/30/2012  . Low back pain 10/26/2012  . Syncope 10/26/2012  . PNA (pneumonia) 09/26/2012  . Acute lower UTI 09/26/2012  . Hypokalemia 09/26/2012  . Leukocytosis 09/26/2012  . Cardiac enzymes elevated 07/25/2011  . Impaired glucose tolerance 06/03/2011  . Preventative health care 06/03/2011  . IRON DEFICIENCY 12/06/2010  . 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 CVA (cerebral vascular accident) ([place]) 01/09/2009  . Aphasia 01/09/2009  . Urinary incontinence 01/09/2009  . PACEMAKER, PERMANENT 01/09/2009  . Dementia ([place]) 05/19/2008  . Hypothyroidism 11/20/2007  . Hyperlipidemia 11/20/2007  . Essential hypertension 11/20/2007  . DIVERTICULOSIS, COLON 11/20/2007  . FATIGUE 11/20/2007  . History of completed stroke 11/20/2007  . TIA 05/14/2003    Orientation RESPIRATION BLADDER Height & Weight     Self  Normal Incontinent Weight:   Height:     BEHAVIORAL SYMPTOMS/MOOD NEUROLOGICAL BOWEL NUTRITION STATUS    (none) Incontinent Diet(see d/c summary)  AMBULATORY STATUS COMMUNICATION OF NEEDS Skin   Limited Assist Verbally Normal                       Personal Care Assistance Level of Assistance  Bathing, Feeding, Dressing Bathing Assistance: Maximum assistance Feeding assistance: Independent Dressing Assistance: Limited assistance     Functional Limitations Info  Sight, Hearing, Speech Sight Info: Adequate Hearing Info: Impaired Speech Info: Adequate    SPECIAL CARE FACTORS FREQUENCY                       Contractures Contractures Info: Not present    Additional Factors  Info  Code Status, Allergies Code Status Info: full Allergies Info: codeine           Current Medications (01/01/2020):  This is the current hospital active medication list Current Facility-Administered Medications  Medication Dose Route Frequency Provider Last Rate Last Admin  . 0.9 %  sodium chloride infusion   Intravenous Continuous Little Ishikawa, MD 75  mL/hr at 01/01/20 0956 New Bag at 01/01/20 0956  . acetaminophen (TYLENOL) tablet 650 mg  650 mg Oral Q6H PRN Opyd, Ilene Qua, MD       Or  . acetaminophen (TYLENOL) suppository 650 mg  650 mg Rectal Q6H PRN Opyd, Ilene Qua, MD      . apixaban (ELIQUIS) tablet 5 mg  5 mg Oral BID Opyd, Ilene Qua, MD   5 mg at 01/01/20 0954  . bisacodyl (DULCOLAX) EC tablet 5 mg  5 mg Oral Daily PRN Opyd, Ilene Qua, MD      . cefTRIAXone (ROCEPHIN) 1 g in sodium chloride 0.9 % 100 mL IVPB  1 g Intravenous Daily Little Ishikawa, MD 200 mL/hr at 01/01/20 0957 1 g at 01/01/20 0957  . irbesartan (AVAPRO) tablet 75 mg  75 mg Oral Daily Opyd, Ilene Qua, MD   75 mg at 01/01/20 0954  . levothyroxine (SYNTHROID) tablet 125 mcg  125 mcg Oral Q0600 Vianne Bulls, MD   125 mcg at 01/01/20 0522  . pantoprazole (PROTONIX) EC tablet 40 mg  40 mg Oral Daily Opyd, Ilene Qua, MD   40 mg at 01/01/20 0954  . pravastatin (PRAVACHOL) tablet 10 mg  10 mg Oral q1800 Opyd, Ilene Qua, MD   10 mg at 12/31/19 1815  . senna-docusate (Senokot-S) tablet 1 tablet  1 tablet Oral QHS PRN Opyd, Ilene Qua, MD      . sodium chloride flush (NS) 0.9 % injection 3 mL  3 mL Intravenous Q12H Opyd, Ilene Qua, MD         Discharge Medications: Please see discharge summary for a list of discharge medications.  Relevant Imaging Results:  Relevant Lab Results:   Additional Information SSN:   [place], [place]

## 2020-01-01 NOTE — Progress Notes (Signed)
Paged MD about elevated BP 185/68.

## 2020-01-01 NOTE — TOC Transition Note (Signed)
Transition of Care [facility]) - CM/SW Discharge Note   Patient Details  Name: [name] MRN: [id number] Date of Birth: 1922/02/02  [facility] CM/SW Contact:  Trish Mage, LCSW Phone Number: 01/01/2020, 3:20 PM   Clinical Narrative:  Patient to return to [facility] today.  PTAR contacted. Nursing, please call report to [phone]. TOC sign off.     Final next level of care: Assisted Living Barriers to Discharge: No Barriers Identified   Patient Goals and CMS Choice        Discharge Placement                       Discharge Plan and Services                                     Social Determinants of Health (SDOH) Interventions     Readmission Risk Interventions No flowsheet data found.

## 2020-01-01 NOTE — Discharge Summary (Signed)
Physician Discharge Summary  [name] [id number] DOB: 09-12-1922 DOA: 12/30/2019  PCP: Biagio Borg, MD  Admit date: 12/30/2019 Discharge date: 01/01/2020  Admitted From: [facility] Disposition: Same  Recommendations for Outpatient Follow-up:  1. Follow up with PCP in 1-2 weeks 2. Please obtain BMP/CBC in one week  Discharge Condition: Stable CODE STATUS: Full Diet recommendation: As tolerated  Brief/Interim Summary:   [name] is a 86 y.o. female with medical history significant for CVA, dementia, hypertension, hypothyroidism, and heart block with pacer, now presenting from her ILF with increased fatigue, increased confusion, and not eating or drinking.  Patient is accompanied by her daughter-in-law who assists with the history.  Patient has reportedly been more fatigued, sleeping more, requiring increased assistance, and not eating or drinking for the past 48 to 72 hours.  The patient has been less interactive, "spacing out" at times.  She has not been agitated and there has not been any shaking or seizure-like activity.  Patient has not expressed any specific complaints and there has not been any recent fall or trauma.  She has not appeared short of breath and no fevers have been reported.  At her baseline, the patient feeds herself, requires assistance ambulating with a walker, sometimes using wheelchair, and requires some assistance with transfers.  She is confused at baseline. Upon arrival to the ED, patient is found to be afebrile, saturating mid 90s on room air, and with stable blood pressure.  EKG features sinus rhythm with IVCD, LVH with repolarization abnormality, and QTc interval of 567 ms.  Chest x-rays negative for acute cardiopulmonary disease and no acute intracranial abnormality noted on head CT.  Chemistry panel and CBC are unremarkable.  Lactic acid 2.0, down to 1.3 after IV fluids, and high-sensitivity troponin mildly elevated and flat (28 x2).  Covid and influenza PCR are negative. Blood culture was collected in the ED, 500 cc normal saline administered, and urinalysis ordered.  Patient admitted as above with acute metabolic encephalopathy concerning for UTI given presentation, abnormal UA, EEG ordered at intake was unremarkable for seizure-like activity although patient does carry history of CVA which does increase her risk of seizure.  At this time patient appears well, tolerating p.o, family discussion today over the phone, given patient's resolution of symptoms on antibiotics will continue p.o. antibiotics at assisted living facility follow clinically.  Patient will need close follow-up with PCP in the next 1 to 2 weeks for repeat evaluation, blood work, urinalysis and to ensure resolution of infection.  Of note patient is on benzodiazepines per medication review, given her age this is unlikely an ideal medication for her for any agitation or anxiety.  Have discussed with the son that given patient's age decreasing her medication regimen would likely behoove the patient at this point and will decrease risk for delirium and polypharmacy.  Patient and family otherwise stable and agreeable for discharge back to facility.  Discharge Diagnoses:  Principal Problem:   Acute encephalopathy Active Problems:   Hypothyroidism   Dementia ([place])   Essential hypertension   History of completed stroke   Atrial fibrillation [facility])    Discharge Instructions   Allergies as of 01/01/2020      Reactions   Codeine Rash      Medication List    STOP taking these medications   ALPRAZolam 0.25 MG tablet Commonly known as: XANAX     TAKE these medications   apixaban 5 MG Tabs tablet Commonly known as: Eliquis  Take 1 tablet (5 mg total) by mouth 2 (two) times daily.   Caltrate 600+D 600-400 MG-UNIT tablet Generic drug: Calcium Carbonate-Vitamin D Take 1 tablet by mouth at bedtime.   Centrum Silver 50+Women Tabs Take 1 tablet by mouth daily.    cephALEXin 250 MG capsule Commonly known as: KEFLEX Take 1 capsule (250 mg total) by mouth 4 (four) times daily for 3 days.   docusate sodium 100 MG capsule Commonly known as: COLACE Take 100 mg by mouth daily.   donepezil 10 MG tablet Commonly known as: ARICEPT TAKE 1 TABLET(10 MG) BY MOUTH DAILY   furosemide 40 MG tablet Commonly known as: LASIX Take 40 mg by mouth daily.   furosemide 40 MG tablet Commonly known as: LASIX Take 1 tablet (40 mg total) by mouth daily.   levothyroxine 125 MCG tablet Commonly known as: SYNTHROID TAKE 1 TABLET(125 MCG) BY MOUTH DAILY   pantoprazole 40 MG tablet Commonly known as: PROTONIX Take 1 tablet (40 mg total) by mouth daily.   polyvinyl alcohol 1.4 % ophthalmic solution Commonly known as: LIQUIFILM TEARS Place 1 drop into both eyes in the morning, at noon, in the evening, and at bedtime.   pravastatin 10 MG tablet Commonly known as: PRAVACHOL Take 1 tablet (10 mg total) by mouth at bedtime.   psyllium 0.52 g capsule Commonly known as: REGULOID Take 0.52 g by mouth daily.   valsartan 80 MG tablet Commonly known as: DIOVAN Take 1 tablet (80 mg total) by mouth daily. Take 80 mg by mouth daily.       Allergies  Allergen Reactions  . Codeine Rash    Procedures/Studies: CT Head Wo Contrast  Result Date: 12/30/2019 CLINICAL DATA:  Increased fatigue, dementia, multiple strokes, baseline confusion, hypertension, CHF, coronary artery disease, atrial fibrillation EXAM: CT HEAD WITHOUT CONTRAST TECHNIQUE: Contiguous axial images were obtained from the base of the skull through the vertex without intravenous contrast. Sagittal and coronal MPR images reconstructed from axial data set. COMPARISON:  11/13/2019 FINDINGS: Brain: Generalized atrophy. Prior LEFT frontotemporal craniotomy and aneurysm clipping. Slight prominence of the ventricular system unchanged. Small vessel chronic ischemic changes of deep cerebral white matter. No midline  shift or mass effect. Large old infarcts in the LEFT MCA and LEFT ACA territories. Old LEFT cerebellar infarct. No intracranial hemorrhage, mass lesion, or evidence of acute infarction. No extra-axial fluid collection. Vascular: No hyperdense vessels. Atherosclerotic calcifications of internal carotid and vertebral arteries at skull base Skull: LEFT frontotemporal craniotomy. No acute calvarial abnormality. Sinuses/Orbits: Clear Other: N/A IMPRESSION: Old LEFT frontotemporal craniotomy and aneurysm clipping. Large old LEFT MCA and ACA territory infarcts. Atrophy with small vessel chronic ischemic changes of deep cerebral white matter. No acute intracranial abnormalities. Electronically Signed   By: Lavonia Dana M.D.   On: 12/30/2019 19:01   DG Chest Port 1 View  Result Date: 12/30/2019 CLINICAL DATA:  86 year old female with weakness. EXAM: PORTABLE CHEST 1 VIEW COMPARISON:  Chest radiograph dated 11/13/2019. FINDINGS: No focal consolidation, pleural effusion, pneumothorax. Stable cardiomegaly. Atherosclerotic calcification of the aorta. Left pectoral pacemaker device. No acute osseous pathology. IMPRESSION: 1. No acute cardiopulmonary process. 2. Stable cardiomegaly. Electronically Signed   By: Anner Crete M.D.   On: 12/30/2019 18:27   EEG adult  Result Date: 12/31/2019 Lora Havens, MD     12/31/2019  7:55 PM Patient Name: [name] MRN: [id number] Epilepsy Attending: Lora Havens Referring Physician/Provider: Dr Mitzi Hansen Date: 12/31/2019 Duration: 23.07 mins Patient history: 86yo  F with ams. EEG to evaluate for seizure Level of alertness: awake AEDs during EEG study: None Technical aspects: This EEG study was done with scalp electrodes positioned according to the 10-20 International system of electrode placement. Electrical activity was acquired at a sampling rate of 500Hz  and reviewed with a high frequency filter of 70Hz  and a low frequency filter of 1Hz . EEG data were recorded  continuously and digitally stored. DESCRIPTION:  The posterior dominant rhythm consists of 8-9 Hz activity of moderate voltage (25-35 uV) seen predominantly in posterior head regions, asymmetric ( R>L) and reactive to eye opening and eye closing. EEG showed continuous generalized rhythmic 3-5hz  theta-delta slowing in left hemisphere, maximal left frontal region which at times appeared sharply contoured. Hyperventilation and photic stimulation were not performed. ABNORMALITY - Continuous slow, left hemisphere, maximal left frontal region IMPRESSION: This study is suggestive of cortical dysfunction in left hemisphere, maximal left frontal region likely secondary to underlying structural abnormality.  No seizures or definite epileptiform discharges were seen throughout the recording. Priyanka Barbra Sarks    Subjective: No acute issues or events overnight, patient tolerating p.o. well, awake alert much more oriented this morning to person general situation, denies chest pain, shortness of breath, nausea, vomiting, diarrhea, constipation, headache, fevers, chills.   Discharge Exam: Vitals:   12/31/19 2102 01/01/20 0538  BP: (!) 165/66 (!) 168/64  Pulse: 61 (!) 59  Resp: 20 20  Temp: (!) 97.5 F (36.4 C) 98 F (36.7 C)  SpO2: 94% 92%   Vitals:   12/31/19 1009 12/31/19 1419 12/31/19 2102 01/01/20 0538  BP: (!) 164/61 (!) 157/67 (!) 165/66 (!) 168/64  Pulse: (!) 59 71 61 (!) 59  Resp: 18 20 20 20   Temp: 98.2 F (36.8 C) 97.6 F (36.4 C) (!) 97.5 F (36.4 C) 98 F (36.7 C)  TempSrc: Oral     SpO2: 95% 95% 94% 92%    General:  Pleasantly resting in bed, No acute distress. HEENT:  Normocephalic atraumatic.  Sclerae nonicteric, noninjected.  Extraocular movements intact bilaterally. Neck:  Without mass or deformity.  Trachea is midline. Lungs:  Clear to auscultate bilaterally without rhonchi, wheeze, or rales. Heart:  Regular rate and rhythm.  Without murmurs, rubs, or gallops. Abdomen:  Soft,  nontender, nondistended.  Without guarding or rebound. Extremities: Without cyanosis, clubbing, edema, or obvious deformity. Vascular:  Dorsalis pedis and posterior tibial pulses palpable bilaterally.   The results of significant diagnostics from this hospitalization (including imaging, microbiology, ancillary and laboratory) are listed below for reference.     Microbiology: Recent Results (from the past 240 hour(s))  Culture, blood (routine x 2)     Status: None (Preliminary result)   Collection Time: 12/30/19  5:48 PM   Specimen: BLOOD LEFT FOREARM  Result Value Ref Range Status   Specimen Description   Final    BLOOD LEFT FOREARM Performed at [facility] [address], [place], [place] [address]    Special Requests   Final    BOTTLES DRAWN AEROBIC ONLY Blood Culture results may not be optimal due to an inadequate volume of blood received in culture bottles Performed at [facility] [address] [place], [place] [address]    Culture   Final    NO GROWTH 2 DAYS Performed at [facility] [address] [place], [place] [address]    Report Status PENDING  Incomplete  Culture, blood (routine x 2)     Status: None (Preliminary  result)   Collection Time: 12/30/19  5:50 PM   Specimen: BLOOD  Result Value Ref Range Status   Specimen Description   Final    BLOOD RIGHT ANTECUBITAL Performed at [facility] [address], [place], [place] [address]    Special Requests   Final    BOTTLES DRAWN AEROBIC AND ANAEROBIC Blood Culture adequate volume Performed at [facility] [address], [place], [place] [address]    Culture   Final    NO GROWTH 2 DAYS Performed at [facility] [address] [place], [place] [address]    Report Status PENDING  Incomplete  Respiratory Panel by RT PCR (Flu A&B, Covid) - Nasopharyngeal Swab     Status: None   Collection Time: 12/30/19  5:56 PM    Specimen: Nasopharyngeal Swab  Result Value Ref Range Status   SARS Coronavirus 2 by RT PCR NEGATIVE NEGATIVE Final    Comment: (NOTE) SARS-CoV-2 target nucleic acids are NOT DETECTED. The SARS-CoV-2 RNA is generally detectable in upper respiratoy specimens during the acute phase of infection. The lowest concentration of SARS-CoV-2 viral copies this assay can detect is 131 copies/mL. A negative result does not preclude SARS-Cov-2 infection and should not be used as the sole basis for treatment or other patient management decisions. A negative result may occur with  improper specimen collection/handling, submission of specimen other than nasopharyngeal swab, presence of viral mutation(s) within the areas targeted by this assay, and inadequate number of viral copies (<131 copies/mL). A negative result must be combined with clinical observations, patient history, and epidemiological information. The expected result is Negative. Fact Sheet for Patients:  PinkCheek.be Fact Sheet for Healthcare Providers:  GravelBags.it This test is not yet ap proved or cleared by the [place] FDA and  has been authorized for detection and/or diagnosis of SARS-CoV-2 by FDA under an Emergency Use Authorization (EUA). This EUA will remain  in effect (meaning this test can be used) for the duration of the COVID-19 declaration under Section 564(b)(1) of the Act, 21 U.S.C. section 360bbb-3(b)(1), unless the authorization is terminated or revoked sooner.    Influenza A by PCR NEGATIVE NEGATIVE Final   Influenza B by PCR NEGATIVE NEGATIVE Final    Comment: (NOTE) The Xpert Xpress SARS-CoV-2/FLU/RSV assay is intended as an aid in  the diagnosis of influenza from Nasopharyngeal swab specimens and  should not be used as a sole basis for treatment. Nasal washings and  aspirates are unacceptable for Xpert Xpress SARS-CoV-2/FLU/RSV  testing. Fact Sheet  for Patients: PinkCheek.be Fact Sheet for Healthcare Providers: GravelBags.it This test is not yet approved or cleared by the [place] FDA and  has been authorized for detection and/or diagnosis of SARS-CoV-2 by  FDA under an Emergency Use Authorization (EUA). This EUA will remain  in effect (meaning this test can be used) for the duration of the  Covid-19 declaration under Section 564(b)(1) of the Act, 21  U.S.C. section 360bbb-3(b)(1), unless the authorization is  terminated or revoked. Performed at [facility], [place] [address] [place], [place] [address]   Culture, Urine     Status: Abnormal (Preliminary result)   Collection Time: 12/30/19  8:39 PM   Specimen: Urine, Clean Catch  Result Value Ref Range Status   Specimen Description   Final    URINE, CLEAN CATCH Performed at [facility], [place] [address], [place], [place] [address]    Special Requests   Final  NONE Performed at [facility], [place] [address], [place], [place] [address]    Culture >=100,000 COLONIES/mL GRAM NEGATIVE RODS (A)  Final   Report Status PENDING  Incomplete  MRSA PCR Screening     Status: None   Collection Time: 12/31/19  9:34 AM   Specimen: Nasal Mucosa; Nasopharyngeal  Result Value Ref Range Status   MRSA by PCR NEGATIVE NEGATIVE Final    Comment:        The GeneXpert MRSA Assay (FDA approved for NASAL specimens only), is one component of a comprehensive MRSA colonization surveillance program. It is not intended to diagnose MRSA infection nor to guide or monitor treatment for MRSA infections. Performed at [facility], [place] [address] [place], [place] [address]      Labs: BNP (last 3 results) No results for input(s): BNP in the last 8760 hours. Basic Metabolic Panel: Recent Labs  Lab 12/30/19 1748 12/31/19 0519 01/01/20 0601  NA 140 142 143  K  3.9 2.9* 4.7  CL 105 109 116*  CO2 25 24 18*  GLUCOSE 107* 105* 145*  BUN 19 18 17   CREATININE 0.95 0.95 0.86  CALCIUM 8.9 8.4* 8.6*  MG  --  2.1  --    Liver Function Tests: Recent Labs  Lab 12/30/19 1748 01/01/20 0601  AST 33 52*  ALT 24 53*  ALKPHOS 72 78  BILITOT 1.0 1.4*  PROT 7.2 6.0*  ALBUMIN 3.8 3.3*   No results for input(s): LIPASE, AMYLASE in the last 168 hours. Recent Labs  Lab 12/31/19 0519  AMMONIA 23   CBC: Recent Labs  Lab 12/30/19 1748 12/31/19 0519 01/01/20 0601  WBC 5.2 4.6 7.9  NEUTROABS 2.6  --   --   HGB 15.4* 13.7 14.1  HCT 45.6 40.7 43.0  MCV 93.4 93.1 96.2  PLT 255 219 212   Cardiac Enzymes: No results for input(s): CKTOTAL, CKMB, CKMBINDEX, TROPONINI in the last 168 hours. BNP: Invalid input(s): POCBNP CBG: Recent Labs  Lab 12/30/19 1724  GLUCAP 117*   D-Dimer No results for input(s): DDIMER in the last 72 hours. Hgb A1c No results for input(s): HGBA1C in the last 72 hours. Lipid Profile No results for input(s): CHOL, HDL, LDLCALC, TRIG, CHOLHDL, LDLDIRECT in the last 72 hours. Thyroid function studies Recent Labs    12/31/19 0519  TSH 0.457   Anemia work up Recent Labs    12/31/19 0519  VITAMINB12 560   Urinalysis    Component Value Date/Time   COLORURINE YELLOW 12/30/2019 1727   APPEARANCEUR HAZY (A) 12/30/2019 1727   LABSPEC 1.020 12/30/2019 1727   PHURINE 7.0 12/30/2019 1727   GLUCOSEU NEGATIVE 12/30/2019 1727   GLUCOSEU NEGATIVE 10/11/2019 0920   HGBUR NEGATIVE 12/30/2019 1727   BILIRUBINUR NEGATIVE 12/30/2019 1727   KETONESUR NEGATIVE 12/30/2019 1727   PROTEINUR NEGATIVE 12/30/2019 1727   UROBILINOGEN 0.2 10/11/2019 0920   NITRITE NEGATIVE 12/30/2019 1727   LEUKOCYTESUR LARGE (A) 12/30/2019 1727   Sepsis Labs Invalid input(s): PROCALCITONIN,  WBC,  LACTICIDVEN Microbiology Recent Results (from the past 240 hour(s))  Culture, blood (routine x 2)     Status: None (Preliminary result)   Collection  Time: 12/30/19  5:48 PM   Specimen: BLOOD LEFT FOREARM  Result Value Ref Range Status   Specimen Description   Final    BLOOD LEFT FOREARM Performed at [facility], [place] [address] [place], [place] [address]    Special Requests   Final  BOTTLES DRAWN AEROBIC ONLY Blood Culture results may not be optimal due to an inadequate volume of blood received in culture bottles Performed at [facility], [place] [address], [place], [place] [address]    Culture   Final    NO GROWTH 2 DAYS Performed at [facility] [address] [place], [place] [address]    Report Status PENDING  Incomplete  Culture, blood (routine x 2)     Status: None (Preliminary result)   Collection Time: 12/30/19  5:50 PM   Specimen: BLOOD  Result Value Ref Range Status   Specimen Description   Final    BLOOD RIGHT ANTECUBITAL Performed at [facility] [address] [place], [place] [address]    Special Requests   Final    BOTTLES DRAWN AEROBIC AND ANAEROBIC Blood Culture adequate volume Performed at [facility] [address], [place], [place] [address]    Culture   Final    NO GROWTH 2 DAYS Performed at [facility] [address] [place], [place] [address]    Report Status PENDING  Incomplete  Respiratory Panel by RT PCR (Flu A&B, Covid) - Nasopharyngeal Swab     Status: None   Collection Time: 12/30/19  5:56 PM   Specimen: Nasopharyngeal Swab  Result Value Ref Range Status   SARS Coronavirus 2 by RT PCR NEGATIVE NEGATIVE Final    Comment: (NOTE) SARS-CoV-2 target nucleic acids are NOT DETECTED. The SARS-CoV-2 RNA is generally detectable in upper respiratoy specimens during the acute phase of infection. The lowest concentration of SARS-CoV-2 viral copies this assay can detect is 131 copies/mL. A negative result does not preclude SARS-Cov-2 infection and should not be used as the sole basis for treatment or other  patient management decisions. A negative result may occur with  improper specimen collection/handling, submission of specimen other than nasopharyngeal swab, presence of viral mutation(s) within the areas targeted by this assay, and inadequate number of viral copies (<131 copies/mL). A negative result must be combined with clinical observations, patient history, and epidemiological information. The expected result is Negative. Fact Sheet for Patients:  PinkCheek.be Fact Sheet for Healthcare Providers:  GravelBags.it This test is not yet ap proved or cleared by the [place] FDA and  has been authorized for detection and/or diagnosis of SARS-CoV-2 by FDA under an Emergency Use Authorization (EUA). This EUA will remain  in effect (meaning this test can be used) for the duration of the COVID-19 declaration under Section 564(b)(1) of the Act, 21 U.S.C. section 360bbb-3(b)(1), unless the authorization is terminated or revoked sooner.    Influenza A by PCR NEGATIVE NEGATIVE Final   Influenza B by PCR NEGATIVE NEGATIVE Final    Comment: (NOTE) The Xpert Xpress SARS-CoV-2/FLU/RSV assay is intended as an aid in  the diagnosis of influenza from Nasopharyngeal swab specimens and  should not be used as a sole basis for treatment. Nasal washings and  aspirates are unacceptable for Xpert Xpress SARS-CoV-2/FLU/RSV  testing. Fact Sheet for Patients: PinkCheek.be Fact Sheet for Healthcare Providers: GravelBags.it This test is not yet approved or cleared by the [place] FDA and  has been authorized for detection and/or diagnosis of SARS-CoV-2 by  FDA under an Emergency Use Authorization (EUA). This EUA will remain  in effect (meaning this test can be used) for the duration of the  Covid-19 declaration under Section 564(b)(1) of the Act, 21  U.S.C. section 360bbb-3(b)(1), unless  the authorization is  terminated or revoked. Performed at [facility], [place] [address], [place], [place] [address]   Culture, Urine     Status: Abnormal (Preliminary result)   Collection Time: 12/30/19  8:39 PM   Specimen: Urine, Clean Catch  Result Value Ref Range Status   Specimen Description   Final    URINE, CLEAN CATCH Performed at [facility], [place] [address] [place], [place] [address]    Special Requests   Final    NONE Performed at [facility], [place] [address], [place],  [place] [address]    Culture >=100,000 COLONIES/mL GRAM NEGATIVE RODS (A)  Final   Report Status PENDING  Incomplete  MRSA PCR Screening     Status: None   Collection Time: 12/31/19  9:34 AM   Specimen: Nasal Mucosa; Nasopharyngeal  Result Value Ref Range Status   MRSA by PCR NEGATIVE NEGATIVE Final    Comment:        The GeneXpert MRSA Assay (FDA approved for NASAL specimens only), is one component of a comprehensive MRSA colonization surveillance program. It is not intended to diagnose MRSA infection nor to guide or monitor treatment for MRSA infections. Performed at [facility], [place] [address] [place], [place] [address]      Time coordinating discharge: Over 30 minutes  SIGNED:   Little Ishikawa, DO [facility] 01/01/2020, 11:05 AM Pager   If 7PM-7AM, please contact night-coverage [email or web address]

## 2020-01-02 DIAGNOSIS — R2681 Unsteadiness on feet: Secondary | ICD-10-CM | POA: Diagnosis not present

## 2020-01-02 DIAGNOSIS — M6281 Muscle weakness (generalized): Secondary | ICD-10-CM | POA: Diagnosis not present

## 2020-01-02 DIAGNOSIS — I69398 Other sequelae of cerebral infarction: Secondary | ICD-10-CM | POA: Diagnosis not present

## 2020-01-02 DIAGNOSIS — R2689 Other abnormalities of gait and mobility: Secondary | ICD-10-CM | POA: Diagnosis not present

## 2020-01-02 DIAGNOSIS — R1312 Dysphagia, oropharyngeal phase: Secondary | ICD-10-CM | POA: Diagnosis not present

## 2020-01-02 LAB — URINE CULTURE: Culture: >=100000 — AB

## 2020-01-04 LAB — CULTURE, BLOOD (ROUTINE X 2)
Culture: NO GROWTH
Culture: NO GROWTH
Special Requests: ADEQUATE

## 2020-01-06 ENCOUNTER — Encounter: Payer: Self-pay | Admitting: Adult Health

## 2020-01-06 DIAGNOSIS — R1312 Dysphagia, oropharyngeal phase: Secondary | ICD-10-CM | POA: Diagnosis not present

## 2020-01-06 DIAGNOSIS — M6281 Muscle weakness (generalized): Secondary | ICD-10-CM | POA: Diagnosis not present

## 2020-01-06 DIAGNOSIS — I69398 Other sequelae of cerebral infarction: Secondary | ICD-10-CM | POA: Diagnosis not present

## 2020-01-06 DIAGNOSIS — R2689 Other abnormalities of gait and mobility: Secondary | ICD-10-CM | POA: Diagnosis not present

## 2020-01-06 DIAGNOSIS — R2681 Unsteadiness on feet: Secondary | ICD-10-CM | POA: Diagnosis not present

## 2020-01-08 ENCOUNTER — Ambulatory Visit (INDEPENDENT_AMBULATORY_CARE_PROVIDER_SITE_OTHER): Payer: Medicare Other | Admitting: Adult Health

## 2020-01-08 ENCOUNTER — Other Ambulatory Visit: Payer: Self-pay

## 2020-01-08 VITALS — BP 133/74 | HR 78 | Temp 98.1°F | Ht 65.0 in | Wt 149.0 lb

## 2020-01-08 DIAGNOSIS — I10 Essential (primary) hypertension: Secondary | ICD-10-CM | POA: Diagnosis not present

## 2020-01-08 DIAGNOSIS — I480 Paroxysmal atrial fibrillation: Secondary | ICD-10-CM

## 2020-01-08 DIAGNOSIS — R2681 Unsteadiness on feet: Secondary | ICD-10-CM | POA: Diagnosis not present

## 2020-01-08 DIAGNOSIS — R1312 Dysphagia, oropharyngeal phase: Secondary | ICD-10-CM | POA: Diagnosis not present

## 2020-01-08 DIAGNOSIS — E785 Hyperlipidemia, unspecified: Secondary | ICD-10-CM | POA: Diagnosis not present

## 2020-01-08 DIAGNOSIS — F0150 Vascular dementia without behavioral disturbance: Secondary | ICD-10-CM | POA: Diagnosis not present

## 2020-01-08 DIAGNOSIS — G309 Alzheimer's disease, unspecified: Secondary | ICD-10-CM

## 2020-01-08 DIAGNOSIS — Z8673 Personal history of transient ischemic attack (TIA), and cerebral infarction without residual deficits: Secondary | ICD-10-CM | POA: Diagnosis not present

## 2020-01-08 DIAGNOSIS — I69398 Other sequelae of cerebral infarction: Secondary | ICD-10-CM | POA: Diagnosis not present

## 2020-01-08 DIAGNOSIS — R2689 Other abnormalities of gait and mobility: Secondary | ICD-10-CM | POA: Diagnosis not present

## 2020-01-08 DIAGNOSIS — M6281 Muscle weakness (generalized): Secondary | ICD-10-CM | POA: Diagnosis not present

## 2020-01-08 DIAGNOSIS — F0280 Dementia in other diseases classified elsewhere without behavioral disturbance: Secondary | ICD-10-CM | POA: Diagnosis not present

## 2020-01-08 NOTE — Progress Notes (Signed)
[facility] [address] [place]. [place] [address] [phone]       STROKE FOLLOW UP NOTE  [name] Date of Birth:  09-30-1921 Medical Record Number:  [id number]   Reason for Referral: stroke follow up    CHIEF COMPLAINT:  Chief Complaint  Patient presents with  . Follow-up    rm 33, with daughter in law, hx of stroke, memory, Difficulty responding, swallowing, and standing    HPI:  [name] is a 86 year old female who is being seen today, 01/08/2020, for stroke follow-up accompanied by her daughter-in-law.  Residual stroke deficits of right hemiparesis, aphasia and cognitive impairment.  Had recent ED admission due to altered mental status and generalized weakness and found to have acute metabolic encephalopathy concerning for UTI and discharged with antibiotics.  CT head negative for acute abnormality.  EEG showed continuous slow, left hemisphere, maximal left frontal region likely secondary to underlying structural abnormality but no seizures or definite epileptiform discharges seen.  Daughter-in-law reports gradual decline of cognition but acute worsening prior to admission with decreased appetite and fluid intake, greater difficulty with ambulation, lethargic, requiring more assistance and lack of interaction or response.  She has remained on Aricept 10 mg daily.  Attempted MMSE at today's visit but unable to obtain.  She continues on Eliquis with history of atrial fibrillation and stroke prevention without bleeding or bruising.  Continues on pravastatin without side effects.  Blood pressure today 133/74.  She continues to reside at [facility].  No further concerns at this time.     History provided for reference purposes only Update via virtual visit 08/26/2019 JM: [name] is a 86 year old female who is being seen today for stroke follow-up via virtual visit due to COVID-19 precautions accompanied by her daughter.  She continues to  reside at [facility].  Due to facility COVID-19 restrictions, family is able to visit 1 time weekly and daughter-in-law can assist as needed with scheduled virtual visits.  Residual stroke deficits of right hemiparesis and aphasia stable.  Able to ambulate short distances with Rollator walker but denies any recent falls.  Will use wheelchair for long distance.  Dementia has slightly worsened but daughter feels this is due to lack of stimulation and conversation with family. She continues on Aricept 10 mg daily without side effects.  She continues on Eliquis for atrial fibrillation and secondary stroke prevention without side effects of recent bleeding or bruising.  She was evaluated in the ED in September due to rectal bleeding secondary to large hemorrhoids.  Lab work stable during that time.  She has not had any additional bleeding or hemorrhoid complications.  Continues on pravastatin without myalgias.  Blood pressure not routinely monitored at facility.  No further concerns at this time.  Stroke admission 12/03/2018 JM: [name] a 86 y.o.femalewith history of a permanent pacemaker for complete AV block, previous strokes, cerebral aneurysm clipping in 1993, hypothyroidism, hypertension, hyperlipidemia, dementia, glucose intolerance, coronary artery disease, and congestive heart failure  who presented on 12/03/2018 with right-sided weakness and aphasia. Neurology consulted with stroke work-up revealing left frontal/ACA territory infarct likely due to A. fib not on AC with residual right arm paresis and paucity of speech.  Initial CT head showed nonhemorrhagic left frontal/ACA territory infarct and multiple old infarcts.  Due to residual deficits, received IV TPA.  CTA head/neck showed emergent left A2 occlusion, severe left and moderate right supraclinoid ICA stenosis, occluded left MCA aneurysm clip with  intermediate collaterals and right ICA bulb atherosclerosis.  Repeat CT  head showed evolving acute left frontal/ACA territory infarct with new hemorrhagic conversion.  Unable to undergo MRI due to pacer.  2D echo EF 55%.  Prior finding of atrial fibrillation via pacer but was previously deemed not AC candidate by cardiology due to history of cerebral aneurysm but since aneurysm has been secured with clipping, possible candidate for [facility] due to history of multiple strokes.  Is recommended to follow-up with CT scan in 1 week's time and Dr. Leonie Man decide on possible use of [facility] with cardiology agreement.  She was started on aspirin 81 mg daily for secondary stroke prevention.  HTN stable.  LDL 48 and recommended continuation of pravastatin 40 mg daily. Underlying history of dementia stable on Aricept.  Other stroke risk factors include advanced age, CAD and CHF.  She was discharged to [facility] for ongoing therapy for residual deficits.  During inpatient rehab, repeat CT head stable and was recommended to initiate Eliquis for atrial fibrillation and secondary stroke prevention.  She was discharged to SNF on 12/26/2018.  Initial visit 05/23/2019: She is being seen today for hospital stroke follow-up visit and accompanied by her daughter-in-law.  She continues to reside at [facility].  Residual deficits of mild right hemiparesis and slightly worsened expressive aphasia from baseline but improving since discharge.  Underlying history of dementia with worsening after stroke.  Currently receiving therapy through [facility] and will receive for additional 2 weeks.  Continues to use rolling walker for assistance with difficulty in pivoting/turning.  She continues to receive assistance through privately paid resource with assistance of ADLs and safety.  She continues on Aricept 10 mg daily.  Discussion regarding possible underlying depression which patient denies but per daughter-in-law, she does make comments frequently about family being unable to visit due to COVID-19 restrictions and  lack of activities at facility.  She continues on Eliquis without bleeding or bruising.  Continues on pravastatin without myalgias.  Blood pressure today satisfactory at 123/71.  She continues to follow with cardiologist Dr. Lovena Le with recently undergoing PPM generator change out and per daughter-in-law, new pacer is not compatible with MRI.  No further concerns at this time.     ROS:   14 system review of systems performed and negative with exception of generalized weakness, failure to thrive, memory loss, confusion, lethargy, decreased appetite  PMH:  Past Medical History:  Diagnosis Date  . Aphasia 01/09/2009  . Arthritis    right hand  . 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 CEREBROVASCULAR ACCIDENT, HX OF 11/20/2007  . CHF (congestive heart failure) ([place])   . Coronary artery disease   . CVA 01/09/2009  . DEMENTIA 05/19/2008  . Dementia ([place])   . DIVERTICULOSIS, COLON 11/20/2007  . FATIGUE 11/20/2007  . Femoral fracture ([place]) 07/01/2013  . GLUCOSE INTOLERANCE 12/06/2010  . HYPERLIPIDEMIA 11/20/2007  . HYPERTENSION 11/20/2007   Echo was done 07/07/11: mod LVH, EF 55-60%, grade 1 diast dysfxn, mild MR, mild LAE, mod TR, PASP 39  . HYPOTHYROIDISM 11/20/2007  . Impaired glucose tolerance 06/03/2011  . IRON DEFICIENCY 12/06/2010  . Pacemaker 2002  . PACEMAKER, PERMANENT 01/09/2009  . TIA 05/14/2003   elevated CE's in setting of TIA and falls in 10/12 - echo normal with no further cardiac w/u pursued  . TIA (transient ischemic attack) 10/10/2011  . Urinary incontinence 01/09/2009    PSH:  Past Surgical History:  Procedure Laterality Date  . ABDOMINAL HYSTERECTOMY    .  CHOLECYSTECTOMY    . CYSTOCELE REPAIR N/A 11/26/2012   Procedure: Cystocele Repair with Lefort Colpocleisis.  colpectomy Levatorplasty Perineorraphy;  Surgeon: Lovenia Kim, MD;  Location: [facility];  Service: Gynecology;  Laterality: N/A;  Cystocele Repair with Lefort Colpocleisis.  Marland Kitchen HIP ARTHROPLASTY Left 07/02/2013   Procedure:  ARTHROPLASTY BIPOLAR HIP;  Surgeon: Mauri Pole, MD;  Location: [facility];  Service: Orthopedics;  Laterality: Left;  . INGUINAL HERNIA REPAIR Right 08/13/2013   Procedure: HERNIA REPAIR INGUINAL INCARCERATED;  Surgeon: Ralene Ok, MD;  Location: [facility];  Service: General;  Laterality: Right;  . Medtronic Cynthia dual chamber pacemaker serial number was [id number] H...04/15/2009    . PPM GENERATOR CHANGEOUT N/A 04/15/2019   Procedure: PPM GENERATOR CHANGEOUT;  Surgeon: Evans Lance, MD;  Location: [facility];  Service: Cardiovascular;  Laterality: N/A;  . s/p cerebral aneurysm  1991  . s/p pacemaker DDD    . s/p retinal attachment    . s/p thyroidectomy      Social History:  Social History   Socioeconomic History  . Marital status: Divorced    Spouse name: Not on file  . Number of children: Not on file  . Years of education: Not on file  . Highest education level: Not on file  Occupational History  . Occupation: retired Radiation protection practitioner, lives at [employer]  . Smoking status: Never Smoker  . Smokeless tobacco: Never Used  Substance and Sexual Activity  . Alcohol use: No  . Drug use: No  . Sexual activity: Not on file  Other Topics Concern  . Not on file  Social History Narrative  . Not on file   Social Determinants of Health   Financial Resource Strain:   . Difficulty of Paying Living Expenses:   Food Insecurity:   . Worried About Charity fundraiser in the Last Year:   . Arboriculturist in the Last Year:   Transportation Needs:   . Film/video editor (Medical):   Marland Kitchen Lack of Transportation (Non-Medical):   Physical Activity:   . Days of Exercise per Week:   . Minutes of Exercise per Session:   Stress:   . Feeling of Stress :   Social Connections:   . Frequency of Communication with Friends and Family:   . Frequency of Social Gatherings with Friends and Family:   . Attends Religious Services:   . Active Member of Clubs or Organizations:   .  Attends Archivist Meetings:   Marland Kitchen Marital Status:   Intimate Partner Violence:   . Fear of Current or Ex-Partner:   . Emotionally Abused:   Marland Kitchen Physically Abused:   . Sexually Abused:     Family History:  Family History  Problem Relation Age of Onset  . Cancer Mother        breast cancer    Medications:   Current Outpatient Medications on File Prior to Visit  Medication Sig Dispense Refill  . apixaban (ELIQUIS) 5 MG TABS tablet Take 1 tablet (5 mg total) by mouth 2 (two) times daily. 60 tablet 11  . Calcium Carbonate-Vitamin D (CALTRATE 600+D) 600-400 MG-UNIT per tablet Take 1 tablet by mouth at bedtime.     . docusate sodium (COLACE) 100 MG capsule Take 100 mg by mouth daily.    . furosemide (LASIX) 40 MG tablet Take 40 mg by mouth as needed.     Marland Kitchen levothyroxine (SYNTHROID) 125 MCG tablet TAKE 1 TABLET(125 MCG) BY  MOUTH DAILY 90 tablet 1  . Multiple Vitamins-Minerals (CENTRUM SILVER 50+WOMEN) TABS Take 1 tablet by mouth daily.    . pantoprazole (PROTONIX) 40 MG tablet Take 1 tablet (40 mg total) by mouth daily. 90 tablet 1  . polyvinyl alcohol (LIQUIFILM TEARS) 1.4 % ophthalmic solution Place 1 drop into both eyes in the morning, at noon, in the evening, and at bedtime.     . pravastatin (PRAVACHOL) 10 MG tablet Take 1 tablet (10 mg total) by mouth at bedtime. 90 tablet 1  . psyllium (REGULOID) 0.52 g capsule Take 0.52 g by mouth daily.    . valsartan (DIOVAN) 80 MG tablet Take 1 tablet (80 mg total) by mouth daily. Take 80 mg by mouth daily. 90 tablet 1   No current facility-administered medications on file prior to visit.    Allergies:   Allergies  Allergen Reactions  . Codeine Rash    Vitals:  Today's Vitals   01/08/20 1049  BP: 133/74  Pulse: 78  Temp: 98.1 F (36.7 C)  Weight: 149 lb (67.6 kg)  Height: 5\' 5"  (1.651 m)   Body mass index is 24.79 kg/m.    Physical Exam  General: Frail pleasant elderly Caucasian female, seated, in no evident  distress Head: head normocephalic and atraumatic.   Neck: supple with no carotid or supraclavicular bruits Cardiovascular: regular rate and rhythm, no murmurs Musculoskeletal: no deformity Skin:  no rash/petichiae Vascular:  Normal pulses all extremities   Neurologic Exam Mental Status: Lethargic through visit nodding off quickly but would awaken without difficulty and respond when spoken to.  Speech impairment with dysarthria and questionable aphasia. Oriented to self but disoriented to place and time. Recent and remote memory impaired.  Attention span, concentration and fund of knowledge impaired. Mood and affect appropriate.  Cranial Nerves: Pupils equal, briskly reactive to light. Extraocular movements full without nystagmus. Visual fields full to confrontation.  HOH bilaterally with hearing aids. Facial sensation intact. Face, tongue, palate moves normally and symmetrically.  Motor: Generalized weakness in all tested extremities Sensory.: intact to touch , pinprick , position and vibratory sensation.  Coordination: Rapid alternating movements normal in all extremities. Finger-to-nose and heel-to-shin difficulty performing Gait and Station: Deferred as patient nonambulatory Reflexes: 1+ and symmetric. Toes downgoing.       ASSESSMENT: [name] is a 86 y.o. year old female presented with right-sided weakness and aphasia receiving IV TPA on 12/03/2018 with stroke work-up revealing left frontal/ACA territory infarct likely secondary to A. fib not on AC. Vascular risk factors include history of stroke, A. fib previously not on AC, HTN, HLD, dementia, CAD and CHF. Daughter-in-law reports gradual decline of cognitive impairment with acute worsening after recent UTI.    PLAN:  1. Left frontal/ACA territory infarct: Continue Eliquis (apixaban) daily  and pravastatin for secondary stroke prevention. Maintain strict control of hypertension with blood pressure goal below 130/90, diabetes  with hemoglobin A1c goal below 6.5% and cholesterol with LDL cholesterol (bad cholesterol) goal below 70 mg/dL.  I also advised the patient to eat a healthy diet with plenty of whole grains, cereals, fruits and vegetables, exercise regularly with at least 30 minutes of continuous activity daily and maintain ideal body weight. 2. HTN: Advised to continue current treatment regimen.  Advised to continue to monitor at home along with continued follow-up with PCP for management 3. HLD: Advised to continue current treatment regimen along with continued follow-up with PCP for future prescribing and monitoring of lipid panel 4. Atrial  fibrillation: Continue to follow with cardiologist Dr. Lovena Le for ongoing monitoring and management.  Also advised to continue to follow with Dr. Lovena Le for lower extremity edema  5. 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 Discussion with daughter regarding potential improvement of mental status with resolution of UTI but difficult to tell at this time if she is currently in end-stage dementia. Due to lack of benefit, recommended discontinuing Aricept 10 mg daily. Discussion regarding possible benefit with establishing care with geriatric provider and may need to start having discussion with family regarding ongoing treatment options and quality versus quantity of life. Patient having difficulty taking medications, lack of appetite with decreased nutrition and fluid intake, decline of functional status and appears failure to thrive. We will further evaluate at follow-up visit as UTI should be resolved by that time    Follow up in 2 months or call earlier if needed   I spent 45 minutes of face-to-face and non-face-to-face time with patient and daughter.  This included previsit chart review, lab review, study review, order entry, electronic health record documentation, discussion  regarding worsening dementia in setting of acute encephalopathy vs end-stage dementia, discussion regarding history of prior stroke and managing stroke risk factors and answered all questions to patient and daughter satisfaction    Frann Rider, AGNP-BC  [facility] [address] [place], [place] [address]  Phone [phone] Fax [phone] Note: This document was prepared with digital dictation and possible smart phrase technology. Any transcriptional errors that result from this process are unintentional.

## 2020-01-08 NOTE — Patient Instructions (Signed)
Please discontinue aricept 10mg  daily as this has not been beneficial   Continue Eliquis (apixaban) daily  and pravastatin  for secondary stroke prevention  Continue to follow up with PCP regarding cholesterol and blood pressure management   Continue to monitor blood pressure at home  Maintain strict control of hypertension with blood pressure goal below 130/90, diabetes with hemoglobin A1c goal below 6.5% and cholesterol with LDL cholesterol (bad cholesterol) goal below 70 mg/dL. I also advised the patient to eat a healthy diet with plenty of whole grains, cereals, fruits and vegetables, exercise regularly and maintain ideal body weight.  Followup in the future with me in 2 months or call earlier if needed       Thank you for coming to see [place] at [facility]. I hope we have been able to provide you high quality care today.  You may receive a patient satisfaction survey over the next few weeks. We would appreciate your feedback and comments so that we may continue to improve ourselves and the health of our patients.

## 2020-01-09 ENCOUNTER — Encounter: Payer: Self-pay | Admitting: Adult Health

## 2020-01-09 DIAGNOSIS — I69398 Other sequelae of cerebral infarction: Secondary | ICD-10-CM | POA: Diagnosis not present

## 2020-01-09 DIAGNOSIS — M6281 Muscle weakness (generalized): Secondary | ICD-10-CM | POA: Diagnosis not present

## 2020-01-09 DIAGNOSIS — R1312 Dysphagia, oropharyngeal phase: Secondary | ICD-10-CM | POA: Diagnosis not present

## 2020-01-09 DIAGNOSIS — R2689 Other abnormalities of gait and mobility: Secondary | ICD-10-CM | POA: Diagnosis not present

## 2020-01-09 DIAGNOSIS — R2681 Unsteadiness on feet: Secondary | ICD-10-CM | POA: Diagnosis not present

## 2020-01-09 NOTE — Progress Notes (Signed)
I agree with the above plan 

## 2020-01-13 ENCOUNTER — Emergency Department (HOSPITAL_COMMUNITY): Payer: Medicare Other

## 2020-01-13 ENCOUNTER — Inpatient Hospital Stay (HOSPITAL_COMMUNITY)
Admission: EM | Admit: 2020-01-13 | Discharge: 2020-01-25 | DRG: 871 | Disposition: E | Payer: Medicare Other | Source: Skilled Nursing Facility | Attending: Internal Medicine | Admitting: Internal Medicine

## 2020-01-13 ENCOUNTER — Other Ambulatory Visit: Payer: Self-pay

## 2020-01-13 ENCOUNTER — Telehealth: Payer: Self-pay | Admitting: Neurology

## 2020-01-13 ENCOUNTER — Encounter (HOSPITAL_COMMUNITY): Payer: Self-pay

## 2020-01-13 DIAGNOSIS — I2510 Atherosclerotic heart disease of native coronary artery without angina pectoris: Secondary | ICD-10-CM | POA: Diagnosis present

## 2020-01-13 DIAGNOSIS — F0390 Unspecified dementia without behavioral disturbance: Secondary | ICD-10-CM | POA: Diagnosis present

## 2020-01-13 DIAGNOSIS — N179 Acute kidney failure, unspecified: Secondary | ICD-10-CM | POA: Diagnosis not present

## 2020-01-13 DIAGNOSIS — I248 Other forms of acute ischemic heart disease: Secondary | ICD-10-CM | POA: Diagnosis present

## 2020-01-13 DIAGNOSIS — I69320 Aphasia following cerebral infarction: Secondary | ICD-10-CM | POA: Diagnosis not present

## 2020-01-13 DIAGNOSIS — Z803 Family history of malignant neoplasm of breast: Secondary | ICD-10-CM

## 2020-01-13 DIAGNOSIS — R2689 Other abnormalities of gait and mobility: Secondary | ICD-10-CM | POA: Diagnosis not present

## 2020-01-13 DIAGNOSIS — R0902 Hypoxemia: Secondary | ICD-10-CM | POA: Diagnosis present

## 2020-01-13 DIAGNOSIS — I110 Hypertensive heart disease with heart failure: Secondary | ICD-10-CM | POA: Diagnosis present

## 2020-01-13 DIAGNOSIS — I69351 Hemiplegia and hemiparesis following cerebral infarction affecting right dominant side: Secondary | ICD-10-CM

## 2020-01-13 DIAGNOSIS — E890 Postprocedural hypothyroidism: Secondary | ICD-10-CM | POA: Diagnosis present

## 2020-01-13 DIAGNOSIS — Z8673 Personal history of transient ischemic attack (TIA), and cerebral infarction without residual deficits: Secondary | ICD-10-CM | POA: Diagnosis not present

## 2020-01-13 DIAGNOSIS — I4510 Unspecified right bundle-branch block: Secondary | ICD-10-CM | POA: Diagnosis not present

## 2020-01-13 DIAGNOSIS — R778 Other specified abnormalities of plasma proteins: Secondary | ICD-10-CM | POA: Diagnosis not present

## 2020-01-13 DIAGNOSIS — R1110 Vomiting, unspecified: Secondary | ICD-10-CM | POA: Diagnosis present

## 2020-01-13 DIAGNOSIS — Z96642 Presence of left artificial hip joint: Secondary | ICD-10-CM | POA: Diagnosis present

## 2020-01-13 DIAGNOSIS — Z20822 Contact with and (suspected) exposure to covid-19: Secondary | ICD-10-CM | POA: Diagnosis present

## 2020-01-13 DIAGNOSIS — I10 Essential (primary) hypertension: Secondary | ICD-10-CM | POA: Diagnosis present

## 2020-01-13 DIAGNOSIS — R1312 Dysphagia, oropharyngeal phase: Secondary | ICD-10-CM | POA: Diagnosis not present

## 2020-01-13 DIAGNOSIS — R404 Transient alteration of awareness: Secondary | ICD-10-CM | POA: Diagnosis not present

## 2020-01-13 DIAGNOSIS — K5790 Diverticulosis of intestine, part unspecified, without perforation or abscess without bleeding: Secondary | ICD-10-CM | POA: Diagnosis not present

## 2020-01-13 DIAGNOSIS — I4891 Unspecified atrial fibrillation: Secondary | ICD-10-CM | POA: Diagnosis present

## 2020-01-13 DIAGNOSIS — Z79890 Hormone replacement therapy (postmenopausal): Secondary | ICD-10-CM

## 2020-01-13 DIAGNOSIS — I214 Non-ST elevation (NSTEMI) myocardial infarction: Secondary | ICD-10-CM | POA: Diagnosis not present

## 2020-01-13 DIAGNOSIS — Z7901 Long term (current) use of anticoagulants: Secondary | ICD-10-CM

## 2020-01-13 DIAGNOSIS — Z950 Presence of cardiac pacemaker: Secondary | ICD-10-CM

## 2020-01-13 DIAGNOSIS — Z66 Do not resuscitate: Secondary | ICD-10-CM | POA: Diagnosis present

## 2020-01-13 DIAGNOSIS — Y95 Nosocomial condition: Secondary | ICD-10-CM | POA: Diagnosis present

## 2020-01-13 DIAGNOSIS — I4819 Other persistent atrial fibrillation: Secondary | ICD-10-CM | POA: Diagnosis present

## 2020-01-13 DIAGNOSIS — I69398 Other sequelae of cerebral infarction: Secondary | ICD-10-CM | POA: Diagnosis not present

## 2020-01-13 DIAGNOSIS — K5730 Diverticulosis of large intestine without perforation or abscess without bleeding: Secondary | ICD-10-CM | POA: Diagnosis present

## 2020-01-13 DIAGNOSIS — Z8679 Personal history of other diseases of the circulatory system: Secondary | ICD-10-CM

## 2020-01-13 DIAGNOSIS — Z87440 Personal history of urinary (tract) infections: Secondary | ICD-10-CM

## 2020-01-13 DIAGNOSIS — Z885 Allergy status to narcotic agent status: Secondary | ICD-10-CM | POA: Diagnosis not present

## 2020-01-13 DIAGNOSIS — R61 Generalized hyperhidrosis: Secondary | ICD-10-CM | POA: Diagnosis present

## 2020-01-13 DIAGNOSIS — R0602 Shortness of breath: Secondary | ICD-10-CM | POA: Diagnosis not present

## 2020-01-13 DIAGNOSIS — E785 Hyperlipidemia, unspecified: Secondary | ICD-10-CM | POA: Diagnosis present

## 2020-01-13 DIAGNOSIS — D684 Acquired coagulation factor deficiency: Secondary | ICD-10-CM | POA: Diagnosis present

## 2020-01-13 DIAGNOSIS — I5032 Chronic diastolic (congestive) heart failure: Secondary | ICD-10-CM | POA: Diagnosis present

## 2020-01-13 DIAGNOSIS — A419 Sepsis, unspecified organism: Principal | ICD-10-CM | POA: Diagnosis present

## 2020-01-13 DIAGNOSIS — R0689 Other abnormalities of breathing: Secondary | ICD-10-CM | POA: Diagnosis not present

## 2020-01-13 DIAGNOSIS — I442 Atrioventricular block, complete: Secondary | ICD-10-CM | POA: Diagnosis present

## 2020-01-13 DIAGNOSIS — K7200 Acute and subacute hepatic failure without coma: Secondary | ICD-10-CM | POA: Diagnosis present

## 2020-01-13 DIAGNOSIS — M6281 Muscle weakness (generalized): Secondary | ICD-10-CM | POA: Diagnosis not present

## 2020-01-13 DIAGNOSIS — J189 Pneumonia, unspecified organism: Secondary | ICD-10-CM | POA: Diagnosis present

## 2020-01-13 DIAGNOSIS — M19041 Primary osteoarthritis, right hand: Secondary | ICD-10-CM | POA: Diagnosis present

## 2020-01-13 DIAGNOSIS — E876 Hypokalemia: Secondary | ICD-10-CM | POA: Diagnosis present

## 2020-01-13 DIAGNOSIS — R6520 Severe sepsis without septic shock: Secondary | ICD-10-CM | POA: Diagnosis present

## 2020-01-13 DIAGNOSIS — R0682 Tachypnea, not elsewhere classified: Secondary | ICD-10-CM

## 2020-01-13 DIAGNOSIS — Z79899 Other long term (current) drug therapy: Secondary | ICD-10-CM

## 2020-01-13 DIAGNOSIS — N170 Acute kidney failure with tubular necrosis: Secondary | ICD-10-CM | POA: Diagnosis present

## 2020-01-13 DIAGNOSIS — R531 Weakness: Secondary | ICD-10-CM | POA: Diagnosis not present

## 2020-01-13 DIAGNOSIS — I480 Paroxysmal atrial fibrillation: Secondary | ICD-10-CM | POA: Diagnosis not present

## 2020-01-13 DIAGNOSIS — R2681 Unsteadiness on feet: Secondary | ICD-10-CM | POA: Diagnosis not present

## 2020-01-13 DIAGNOSIS — E039 Hypothyroidism, unspecified: Secondary | ICD-10-CM | POA: Diagnosis not present

## 2020-01-13 LAB — COMPREHENSIVE METABOLIC PANEL
ALT: 1028 U/L — ABNORMAL HIGH (ref 0–44)
AST: 712 U/L — ABNORMAL HIGH (ref 15–41)
Albumin: 3.4 g/dL — ABNORMAL LOW (ref 3.5–5.0)
Alkaline Phosphatase: 95 U/L (ref 38–126)
Anion gap: 14 (ref 5–15)
BUN: 41 mg/dL — ABNORMAL HIGH (ref 8–23)
CO2: 24 mmol/L (ref 22–32)
Calcium: 8.4 mg/dL — ABNORMAL LOW (ref 8.9–10.3)
Chloride: 100 mmol/L (ref 98–111)
Creatinine, Ser: 2.49 mg/dL — ABNORMAL HIGH (ref 0.44–1.00)
GFR calc Af Amer: 18 mL/min — ABNORMAL LOW (ref >60)
GFR calc non Af Amer: 16 mL/min — ABNORMAL LOW (ref >60)
Glucose, Bld: 179 mg/dL — ABNORMAL HIGH (ref 70–99)
Potassium: 3.4 mmol/L — ABNORMAL LOW (ref 3.5–5.1)
Sodium: 138 mmol/L (ref 135–145)
Total Bilirubin: 1.6 mg/dL — ABNORMAL HIGH (ref 0.3–1.2)
Total Protein: 6.5 g/dL (ref 6.5–8.1)

## 2020-01-13 LAB — CBC WITH DIFFERENTIAL/PLATELET
Abs Immature Granulocytes: 0.06 10*3/uL (ref 0.00–0.07)
Basophils Absolute: 0 10*3/uL (ref 0.0–0.1)
Basophils Relative: 0 %
Eosinophils Absolute: 0 10*3/uL (ref 0.0–0.5)
Eosinophils Relative: 0 %
HCT: 40.4 % (ref 36.0–46.0)
Hemoglobin: 12.8 g/dL (ref 12.0–15.0)
Immature Granulocytes: 1 %
Lymphocytes Relative: 12 %
Lymphs Abs: 1.4 10*3/uL (ref 0.7–4.0)
MCH: 31.1 pg (ref 26.0–34.0)
MCHC: 31.7 g/dL (ref 30.0–36.0)
MCV: 98.1 fL (ref 80.0–100.0)
Monocytes Absolute: 0.9 10*3/uL (ref 0.1–1.0)
Monocytes Relative: 8 %
Neutro Abs: 9.1 10*3/uL — ABNORMAL HIGH (ref 1.7–7.7)
Neutrophils Relative %: 79 %
Platelets: 178 10*3/uL (ref 150–400)
RBC: 4.12 MIL/uL (ref 3.87–5.11)
RDW: 14.2 % (ref 11.5–15.5)
WBC: 11.4 10*3/uL — ABNORMAL HIGH (ref 4.0–10.5)
nRBC: 0 % (ref 0.0–0.2)

## 2020-01-13 LAB — TROPONIN I (HIGH SENSITIVITY): Troponin I (High Sensitivity): 5187 ng/L (ref <18)

## 2020-01-13 LAB — PROTIME-INR
INR: 3.4 — ABNORMAL HIGH (ref 0.8–1.2)
Prothrombin Time: 34.3 seconds — ABNORMAL HIGH (ref 11.4–15.2)

## 2020-01-13 LAB — URINALYSIS, ROUTINE W REFLEX MICROSCOPIC
Bilirubin Urine: NEGATIVE
Glucose, UA: NEGATIVE mg/dL
Hgb urine dipstick: NEGATIVE
Ketones, ur: NEGATIVE mg/dL
Leukocytes,Ua: NEGATIVE
Nitrite: NEGATIVE
Protein, ur: 30 mg/dL — AB
Specific Gravity, Urine: 1.019 (ref 1.005–1.030)
pH: 5 (ref 5.0–8.0)

## 2020-01-13 LAB — POC SARS CORONAVIRUS 2 AG -  ED: SARS Coronavirus 2 Ag: NEGATIVE

## 2020-01-13 LAB — LACTIC ACID, PLASMA: Lactic Acid, Venous: 3.9 mmol/L (ref 0.5–1.9)

## 2020-01-13 LAB — BRAIN NATRIURETIC PEPTIDE: B Natriuretic Peptide: 558.4 pg/mL — ABNORMAL HIGH (ref 0.0–100.0)

## 2020-01-13 LAB — CBG MONITORING, ED: Glucose-Capillary: 162 mg/dL — ABNORMAL HIGH (ref 70–99)

## 2020-01-13 MED ORDER — ASPIRIN 81 MG PO CHEW: 324.0000 mg | CHEWABLE_TABLET | Freq: Once | ORAL | Status: DC

## 2020-01-13 MED ORDER — LACTATED RINGERS IV SOLN: INTRAVENOUS | Status: AC

## 2020-01-13 MED ORDER — SODIUM CHLORIDE 0.9 % IV BOLUS
500.0000 mL | Freq: Once | INTRAVENOUS | Status: AC
  Administered 2020-01-13: 20:00:00 500 mL via INTRAVENOUS

## 2020-01-13 MED ORDER — ONDANSETRON HCL 4 MG/2ML IJ SOLN: 4.0000 mg | Freq: Four times a day (QID) | INTRAMUSCULAR | Status: DC | PRN

## 2020-01-13 MED ORDER — ACETAMINOPHEN 325 MG PO TABS
650.0000 mg | ORAL_TABLET | Freq: Once | ORAL | Status: DC
  Filled 2020-01-13: qty 2

## 2020-01-13 MED ORDER — PANTOPRAZOLE SODIUM 40 MG PO TBEC: 40.0000 mg | DELAYED_RELEASE_TABLET | Freq: Every day | ORAL | Status: DC

## 2020-01-13 MED ORDER — LEVOTHYROXINE SODIUM 25 MCG PO TABS: 125.0000 ug | ORAL_TABLET | Freq: Every day | ORAL | Status: DC

## 2020-01-13 MED ORDER — ASPIRIN EC 81 MG PO TBEC: 81.0000 mg | DELAYED_RELEASE_TABLET | Freq: Every day | ORAL | Status: DC

## 2020-01-13 MED ORDER — ACETAMINOPHEN 650 MG RE SUPP
650.0000 mg | Freq: Once | RECTAL | Status: AC
  Administered 2020-01-13: 650 mg via RECTAL
  Filled 2020-01-13: qty 1

## 2020-01-13 MED ORDER — SODIUM CHLORIDE 0.9 % IV SOLN
2.0000 g | Freq: Once | INTRAVENOUS | Status: AC
  Administered 2020-01-13: 2 g via INTRAVENOUS
  Filled 2020-01-13: qty 2

## 2020-01-13 MED ORDER — VANCOMYCIN HCL IN DEXTROSE 1-5 GM/200ML-% IV SOLN
1000.0000 mg | Freq: Once | INTRAVENOUS | Status: AC
  Administered 2020-01-13: 20:00:00 1000 mg via INTRAVENOUS
  Filled 2020-01-13: qty 200

## 2020-01-13 MED ORDER — ONDANSETRON HCL 4 MG PO TABS: 4.0000 mg | ORAL_TABLET | Freq: Four times a day (QID) | ORAL | Status: DC | PRN

## 2020-01-13 NOTE — ED Triage Notes (Signed)
Patient arrived via [facility] from independently living ([facility]).   Called out for "breathing problems".  When ems arrived patient having BM on toilet.  Patient weak in all extreamities RR-50 161/94 cbg-204 96%RA  Lower lobes diminished Upper lobes clear  Patient has a cough    On the 5th of this month pt diagnosed with UTI.    Patient is normally not vocal due to prior CVA hx.   Son [facility]) but daughter in law is on the way.   Code Sepsis called by EMS.

## 2020-01-13 NOTE — ED Notes (Signed)
Date and time results received: 01/21/2020 2017 (use smartphrase ".now" to insert current time)  Test: Troponin Critical Value: [id number]  Name of Provider Notified: Dr.Goldston  Orders Received? Or Actions Taken?:

## 2020-01-13 NOTE — ED Provider Notes (Signed)
[facility] DEPT Provider Note   CSN: [id number] Arrival date & time: 01/08/2020  1825  LEVEL 5 CAVEAT - NONVERBAL History Chief Complaint  Patient presents with  . Code Sepsis    [name] is a 86 y.o. female.  HPI 86 year old female presents from independent living with acute shortness of breath.  History is from the nursing staff to talk to EMS, who is no longer present.  The patient is nonverbal and not able to significantly contribute to the history.  Patient apparently was found in her room feeling weak and having increased respirations.  She was diaphoretic.  Otherwise, history is very limited.  When EMS arrived, she was having a bowel movement on the toilet.   Past Medical History:  Diagnosis Date  . Aphasia 01/09/2009  . Arthritis    right hand  . 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 CEREBROVASCULAR ACCIDENT, HX OF 11/20/2007  . CHF (congestive heart failure) ([place])   . Coronary artery disease   . CVA 01/09/2009  . DEMENTIA 05/19/2008  . Dementia ([place])   . DIVERTICULOSIS, COLON 11/20/2007  . FATIGUE 11/20/2007  . Femoral fracture ([place]) 07/01/2013  . GLUCOSE INTOLERANCE 12/06/2010  . HYPERLIPIDEMIA 11/20/2007  . HYPERTENSION 11/20/2007   Echo was done 07/07/11: mod LVH, EF 55-60%, grade 1 diast dysfxn, mild MR, mild LAE, mod TR, PASP 39  . HYPOTHYROIDISM 11/20/2007  . Impaired glucose tolerance 06/03/2011  . IRON DEFICIENCY 12/06/2010  . Pacemaker 2002  . PACEMAKER, PERMANENT 01/09/2009  . TIA 05/14/2003   elevated CE's in setting of TIA and falls in 10/12 - echo normal with no further cardiac w/u pursued  . TIA (transient ischemic attack) 10/10/2011  . Urinary incontinence 01/09/2009    Patient Active Problem List   Diagnosis Date Noted  . Sepsis ([place]) 12/31/2019  . Acute kidney injury (AKI) with acute tubular necrosis (ATN) (HCC)   . NSTEMI (non-ST elevated myocardial infarction) ([place])   . Acute encephalopathy 12/30/2019  . Bloody  diarrhea 06/25/2019  . Pressure ulcer of sacral region, stage 2 ([place]) 02/21/2019  . Hypothermia 02/20/2019  . Dysphagia, post-stroke   . Global aphasia   . Acute right ACA stroke ([place]) 12/07/2018  . Stroke (cerebrum) ([place]) 12/03/2018  . Cough 10/11/2018  . Abnormal breath sounds 10/11/2018  . Closed nondisplaced fracture of proximal phalanx of lesser toe of right foot 06/26/2018  . Right ankle pain 06/21/2018  . Right foot pain 06/21/2018  . Abnormal CXR 04/26/2017  . Lobar pneumonia ([place]) 01/20/2017  . Acute on chronic respiratory failure with hypoxia ([place]) 01/17/2017  . Acute respiratory failure with hypoxia ([place]) 01/17/2017  . DOE (dyspnea on exertion) 10/23/2014  . Hyponatremia 10/23/2014  . RLL pneumonia 10/11/2013  . Other specified anemias 10/10/2013  . Acute respiratory failure ([place]) 08/15/2013  . Aspiration pneumonia ([place]) 08/15/2013  . Altered mental status 08/15/2013  . Incarcerated femoral hernia 08/14/2013  . Esophageal reflux 08/05/2013  . Unspecified constipation 07/03/2013  . Closed fracture of left hip ([place]) 07/01/2013  . 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 Atrial fibrillation ([place]) 10/30/2012  . Low back pain 10/26/2012  . Syncope 10/26/2012  . PNA (pneumonia) 09/26/2012  . Acute lower UTI 09/26/2012  . Hypokalemia 09/26/2012  . Leukocytosis 09/26/2012  . Cardiac enzymes elevated 07/25/2011  . Impaired glucose tolerance 06/03/2011  . Preventative health care 06/03/2011  . IRON DEFICIENCY 12/06/2010  . 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 CVA (cerebral vascular accident) ([place])  01/09/2009  . Aphasia 01/09/2009  . Urinary incontinence 01/09/2009  . PACEMAKER, PERMANENT 01/09/2009  . Dementia ([place]) 05/19/2008  . Hypothyroidism 11/20/2007  . Hyperlipidemia 11/20/2007  . Essential hypertension 11/20/2007  . DIVERTICULOSIS, COLON 11/20/2007  . FATIGUE 11/20/2007  . History of completed stroke 11/20/2007  . TIA 05/14/2003    Past  Surgical History:  Procedure Laterality Date  . ABDOMINAL HYSTERECTOMY    . CHOLECYSTECTOMY    . CYSTOCELE REPAIR N/A 11/26/2012   Procedure: Cystocele Repair with [place]ef[place] Colpocleisis.  colpectomy Levatorplasty Perineorraphy;  Surgeon: Lovenia Kim, MD;  Location: [facility];  Service: Gynecology;  Laterality: N/A;  Cystocele Repair with Lefort Colpocleisis.  Marland Kitchen HIP ARTHROPLASTY Left 07/02/2013   Procedure: ARTHROPLASTY BIPOLAR HIP;  Surgeon: Mauri Pole, MD;  Location: [facility];  Service: Orthopedics;  Laterality: Left;  . INGUINAL HERNIA REPAIR Right 08/13/2013   Procedure: HERNIA REPAIR INGUINAL INCARCERATED;  Surgeon: Ralene Ok, MD;  Location: [facility];  Service: General;  Laterality: Right;  . Medtronic Cynthia dual chamber pacemaker serial number was [id number]...04/15/2009    . PPM GENERATOR CHANGEOUT N/A 04/15/2019   Procedure: PPM GENERATOR CHANGEOUT;  Surgeon: Evans Lance, MD;  Location: [facility];  Service: Cardiovascular;  Laterality: N/A;  . s/p cerebral aneurysm  1991  . s/p pacemaker DDD    . s/p retinal attachment    . s/p thyroidectomy       OB History   No obstetric history on file.     Family History  Problem Relation Age of Onset  . Cancer Mother        breast cancer    Social History   Tobacco Use  . Smoking status: Never Smoker  . Smokeless tobacco: Never Used  Substance Use Topics  . Alcohol use: No  . Drug use: No    Home Medications Prior to Admission medications   Medication Sig Start Date End Date Taking? Authorizing Provider  apixaban (ELIQUIS) 5 MG TABS tablet Take 1 tablet (5 mg total) by mouth 2 (two) times daily. 09/03/19  Yes Evans Lance, MD  Calcium Carbonate-Vitamin D (CALTRATE 600+D) 600-400 MG-UNIT per tablet Take 1 tablet by mouth at bedtime.    Yes [provider]  docusate sodium (COLACE) 100 MG capsule Take 100 mg by mouth daily.   Yes [provider]  levothyroxine (SYNTHROID) 125 MCG tablet TAKE 1  TABLET(125 MCG) BY MOUTH DAILY 09/03/19  Yes Biagio Borg, MD  Multiple Vitamins-Minerals (CENTRUM SILVER 50+WOMEN) TABS Take 1 tablet by mouth daily.   Yes [provider]  pantoprazole (PROTONIX) 40 MG tablet Take 1 tablet (40 mg total) by mouth daily. 09/03/19  Yes Biagio Borg, MD  polyvinyl alcohol (LIQUIFILM TEARS) 1.4 % ophthalmic solution Place 1 drop into both eyes in the morning, at noon, in the evening, and at bedtime.    Yes [provider]  pravastatin (PRAVACHOL) 10 MG tablet Take 1 tablet (10 mg total) by mouth at bedtime. Patient taking differently: Take 10 mg by mouth daily.  09/03/19  Yes Biagio Borg, MD  psyllium (REGULOID) 0.52 g capsule Take 0.52 g by mouth daily.   Yes [provider]  valsartan (DIOVAN) 80 MG tablet Take 1 tablet (80 mg total) by mouth daily. Take 80 mg by mouth daily. 09/03/19  Yes Biagio Borg, MD    Allergies    Codeine  Review of Systems   Review of Systems  Unable to perform  ROS: Patient nonverbal    Physical Exam Updated Vital Signs BP (!) 142/65   Pulse (!) 59   Temp 99 F (37.2 C) (Rectal)   Resp (!) 33   Wt 65.8 kg   SpO2 97%   BMI 24.13 kg/m   Physical Exam Vitals and nursing note reviewed.  Constitutional:      Appearance: She is well-developed. She is not ill-appearing or diaphoretic.  HENT:     Head: Normocephalic and atraumatic.     Right Ear: External ear normal.     Left Ear: External ear normal.     Nose: Nose normal.     Mouth/Throat:     Mouth: Mucous membranes are dry.  Eyes:     General:        Right eye: No discharge.        Left eye: No discharge.  Cardiovascular:     Rate and Rhythm: Normal rate and regular rhythm.     Heart sounds: Normal heart sounds.  Pulmonary:     Effort: Pulmonary effort is normal.     Breath sounds: Normal breath sounds.     Comments: At times the patient is quite tachypneic but then seems to slow down.  No respiratory distress or accessory  muscles. Abdominal:     General: There is no distension.     Palpations: Abdomen is soft.     Tenderness: There is no abdominal tenderness.  Skin:    General: Skin is warm and dry.  Neurological:     Mental Status: She is alert.  Psychiatric:        Mood and Affect: Mood is not anxious.     ED Results / Procedures / Treatments   Labs (all labs ordered are listed, but only abnormal results are displayed) Labs Reviewed  COMPREHENSIVE METABOLIC PANEL - Abnormal; Notable for the following components:      Result Value   Potassium 3.4 (*)    Glucose, Bld 179 (*)    BUN 41 (*)    Creatinine, Ser 2.49 (*)    Calcium 8.4 (*)    Albumin 3.4 (*)    AST 712 (*)    ALT 1,028 (*)    Total Bilirubin 1.6 (*)    GFR calc non Af Amer 16 (*)    GFR calc Af Amer 18 (*)    All other components within normal limits  LACTIC ACID, PLASMA - Abnormal; Notable for the following components:   Lactic Acid, Venous 3.9 (*)    All other components within normal limits  CBC WITH DIFFERENTIAL/PLATELET - Abnormal; Notable for the following components:   WBC 11.4 (*)    Neutro Abs 9.1 (*)    All other components within normal limits  PROTIME-INR - Abnormal; Notable for the following components:   Prothrombin Time 34.3 (*)    INR 3.4 (*)    All other components within normal limits  URINALYSIS, ROUTINE W REFLEX MICROSCOPIC - Abnormal; Notable for the following components:   Color, Urine AMBER (*)    APPearance HAZY (*)    Protein, ur 30 (*)    Bacteria, UA RARE (*)    All other components within normal limits  BRAIN NATRIURETIC PEPTIDE - Abnormal; Notable for the following components:   B Natriuretic Peptide 558.4 (*)    All other components within normal limits  CBG MONITORING, ED - Abnormal; Notable for the following components:   Glucose-Capillary 162 (*)    All other components within  normal limits  TROPONIN I (HIGH SENSITIVITY) - Abnormal; Notable for the following components:   Troponin I  (High Sensitivity) 5,187 (*)    All other components within normal limits  CULTURE, BLOOD (ROUTINE X 2)  CULTURE, BLOOD (ROUTINE X 2)  SARS CORONAVIRUS 2 (TAT 6-24 HRS)  COMPREHENSIVE METABOLIC PANEL  CBC WITH DIFFERENTIAL/PLATELET  TSH  LACTIC ACID, PLASMA  CBC WITH DIFFERENTIAL/PLATELET  HEPARIN LEVEL (UNFRACTIONATED)  APTT  POC SARS CORONAVIRUS 2 AG -  ED  TROPONIN I (HIGH SENSITIVITY)    EKG None  Radiology DG Chest Port 1 View  Result Date: 01/12/2020 CLINICAL DATA:  Shortness of breath. Suspected sepsis. EXAM: PORTABLE CHEST 1 VIEW COMPARISON:  Radiograph 12/30/2019. FINDINGS: Dual lead left-sided pacemaker remains in place. Cardiomegaly which is unchanged to slightly increased from prior exam. Unchanged mediastinal contours. Aortic atherosclerosis. Progressive retrocardiac opacity which may be combination of pleural fluid and atelectasis/airspace disease. Patchy right lung base opacities new. Vascular congestion without edema. No pneumothorax. Unchanged osseous structures. IMPRESSION: 1. Retrocardiac opacity which may be combination of pleural fluid and atelectasis/airspace disease such as pneumonia. 2. Patchy right lung base opacities may represent atelectasis or pneumonia. 3. Cardiomegaly with vascular congestion. Aortic Atherosclerosis (ICD10-I70.0). Electronically Signed   By: Keith Rake M.D.   On: 01/01/2020 19:24   DG Abd Portable 1 View  Result Date: 12/26/2019 CLINICAL DATA:  Vomiting and shortness of breath. EXAM: PORTABLE ABDOMEN - 1 VIEW COMPARISON:  None. FINDINGS: Bowel gas pattern does not show ileus or obstruction. Normal amount of fecal matter. Clips in the right upper quadrant consistent with previous cholecystectomy. Previous left hip replacement. Aortic atherosclerosis. IMPRESSION: No acute finding. Normal bowel gas pattern. Normal amount of fecal matter. Unremarkable radiograph for age. Electronically Signed   By: Nelson Chimes M.D.   On: 01/10/2020 21:23     Procedures .Critical Care Performed by: Sherwood Gambler, MD Authorized by: Sherwood Gambler, MD   Critical care provider statement:    Critical care time (minutes):  50   Critical care time was exclusive of:  Separately billable procedures and treating other patients   Critical care was necessary to treat or prevent imminent or life-threatening deterioration of the following conditions:  Cardiac failure, circulatory failure and sepsis   Critical care was time spent personally by me on the following activities:  Discussions with consultants, evaluation of patient's response to treatment, examination of patient, ordering and performing treatments and interventions, ordering and review of laboratory studies, ordering and review of radiographic studies, pulse oximetry, re-evaluation of patient's condition, obtaining history from patient or surrogate and review of old charts   (including critical care time)  Medications Ordered in ED Medications  aspirin chewable tablet 324 mg (324 mg Oral Not Given 01/22/2020 2126)  levothyroxine (SYNTHROID) tablet 125 mcg (has no administration in time range)  pantoprazole (PROTONIX) EC tablet 40 mg (has no administration in time range)  ondansetron (ZOFRAN) tablet 4 mg (has no administration in time range)    Or  ondansetron (ZOFRAN) injection 4 mg (has no administration in time range)  lactated ringers infusion ( Intravenous New Bag/Given 01/14/20 0035)  aspirin EC tablet 81 mg (has no administration in time range)  piperacillin-tazobactam (ZOSYN) IVPB 2.25 g (has no administration in time range)  vancomycin (VANCOREADY) IVPB 500 mg/100 mL (500 mg Intravenous New Bag/Given 01/14/20 0034)  ceFEPIme (MAXIPIME) 2 g in sodium chloride 0.9 % 100 mL IVPB (0 g Intravenous Stopped 01/02/2020 2028)  vancomycin (VANCOCIN) IVPB 1000 mg/200 mL  premix (0 mg Intravenous Stopped 01/09/2020 2130)  sodium chloride 0.9 % bolus 500 mL (0 mLs Intravenous Stopped 01/02/2020 2330)   sodium chloride 0.9 % bolus 500 mL (0 mLs Intravenous Stopped 01/14/20 0007)  acetaminophen (TYLENOL) suppository 650 mg (650 mg Rectal Given 01/20/2020 2051)    ED Course  I have reviewed the triage vital signs and the nursing notes.  Pertinent labs & imaging results that were available during my care of the patient were reviewed by me and considered in my medical decision making (see chart for details).  Clinical Course as of Jan 13 57  Mon Jan 13, 2020  1916 Rectal temp is 101. Will call code sepsis. Just had UTI requiring hospitalization. Is not hypoxic. Will start with dose of cefepime.    [SG]  1928 Chest xray shows pneumonia. Given her cough and recent admission, will treat as HCAP and add vancomycin.   [SG]    Clinical Course User Index [SG] Sherwood Gambler, MD   MDM Rules/Calculators/A&P                      Patient found to have severe sepsis with lactate of 3.9, as well as endorgan damage of kidneys, liver, and heart.  She is intermittently tachypneic though is able to slow this her breathing rate down.  She is not hypoxic.  Treated broadly with vancomycin and cefepime for pneumonia/sepsis.  I had a long discussion with daughter-in-law and son.  They would like for her to be DNR but still be treated.  I discussed with Dr. Jeannette Corpus, of cardiology, who recommends continuing Eliquis which would be okay as far as anticoagulation rather than heparin.  She has been vomiting which likely helps explain some of this acute kidney injury.  Abdominal x-ray is unrevealing.  No abdominal tenderness so I think SBO or obstruction is less likely.  Labs and chest x-rays reviewed personally.  Discussed with family how sick patient is.  Dr. Hal Hope to admit. Final Clinical Impression(s) / ED Diagnoses Final diagnoses:  Severe sepsis ([place])  HCAP (healthcare-associated pneumonia)  Acute kidney injury (AKI) with acute tubular necrosis (ATN) ([place])  NSTEMI (non-ST elevated myocardial infarction) [facility])     Rx / [place] Orders ED Discharge Orders    None       Sherwood Gambler, MD 01/14/20 [phone]

## 2020-01-13 NOTE — Progress Notes (Signed)
A consult was received from an ED physician for vancomycin and cefepime per pharmacy dosing (for an indication other than meningitis). The patient's profile has been reviewed for ht/wt/allergies/indication/available labs. A one time order has been placed for the above antibiotics.  Further antibiotics/pharmacy consults should be ordered by admitting physician if indicated.                       Reuel Boom, PharmD, BCPS [phone] 01/22/2020, 7:39 PM

## 2020-01-13 NOTE — H&P (Signed)
History and Physical    [name] [id number] DOB: Jan 13, 1922 DOA: 12/30/2019  PCP: Biagio Borg, MD  Patient coming from: [place].  History obtained from patient's daughter-in-law.  Patient is aphasic from previous stroke.  Chief Complaint: Shortness of breath.  HPI: [name] is a 86 y.o. female with history of CVA with aphasia, complete heart block status post pacemaker placement, dementia, hypothyroidism, hyperlipidemia was recently admitted for UTI was found to be increasingly short of breath after patient had 2-3 episodes of vomiting.  Patient since discharge has been doing poorly as per the family.  ED Course: In the ER patient was hypoxic chest x-ray showing infiltrates concerning for pneumonia and patient was tachycardic febrile with labs showing lactic acid of 3.1 WBC 11.4 creatinine 2.4 potassium 3.4 AST 712 ALT 1028 Covid test negative.  Acute abdominal series unremarkable.  Patient was started on sepsis protocol fluid bolus IV antibiotics following which patient's lactic acid improved.  Patient's troponin was markedly elevated at 5000 for which cardiology was consulted by ER physician and recommended anticoagulation and aspirin.  Patient admitted for sepsis likely from pneumonia.  Review of Systems: As per HPI, rest all negative.   Past Medical History:  Diagnosis Date  . Aphasia 01/09/2009  . Arthritis    right hand  . 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 CEREBROVASCULAR ACCIDENT, HX OF 11/20/2007  . CHF (congestive heart failure) ([place])   . Coronary artery disease   . CVA 01/09/2009  . DEMENTIA 05/19/2008  . Dementia ([place])   . DIVERTICULOSIS, COLON 11/20/2007  . FATIGUE 11/20/2007  . Femoral fracture ([place]) 07/01/2013  . GLUCOSE INTOLERANCE 12/06/2010  . HYPERLIPIDEMIA 11/20/2007  . HYPERTENSION 11/20/2007   Echo was done 07/07/11: mod LVH, EF 55-60%, grade 1 diast dysfxn, mild MR, mild LAE, mod TR, PASP 39  . HYPOTHYROIDISM 11/20/2007  .  Impaired glucose tolerance 06/03/2011  . IRON DEFICIENCY 12/06/2010  . Pacemaker 2002  . PACEMAKER, PERMANENT 01/09/2009  . TIA 05/14/2003   elevated CE's in setting of TIA and falls in 10/12 - echo normal with no further cardiac w/u pursued  . TIA (transient ischemic attack) 10/10/2011  . Urinary incontinence 01/09/2009    Past Surgical History:  Procedure Laterality Date  . ABDOMINAL HYSTERECTOMY    . CHOLECYSTECTOMY    . CYSTOCELE REPAIR N/A 11/26/2012   Procedure: Cystocele Repair with Lefort Colpocleisis.  colpectomy Levatorplasty Perineorraphy;  Surgeon: Lovenia Kim, MD;  Location: [facility];  Service: Gynecology;  Laterality: N/A;  Cystocele Repair with Lefort Colpocleisis.  Marland Kitchen HIP ARTHROPLASTY Left 07/02/2013   Procedure: ARTHROPLASTY BIPOLAR HIP;  Surgeon: Mauri Pole, MD;  Location: [facility];  Service: Orthopedics;  Laterality: Left;  . INGUINAL HERNIA REPAIR Right 08/13/2013   Procedure: HERNIA REPAIR INGUINAL INCARCERATED;  Surgeon: Ralene Ok, MD;  Location: [facility];  Service: General;  Laterality: Right;  . Medtronic Cynthia dual chamber pacemaker serial number was [id number] H...04/15/2009    . PPM GENERATOR CHANGEOUT N/A 04/15/2019   Procedure: PPM GENERATOR CHANGEOUT;  Surgeon: Evans Lance, MD;  Location: [facility];  Service: Cardiovascular;  Laterality: N/A;  . s/p cerebral aneurysm  1991  . s/p pacemaker DDD    . s/p retinal attachment    . s/p thyroidectomy       reports that she has never smoked. She has never used smokeless tobacco. She reports that she does not drink alcohol or use drugs.  Allergies  Allergen Reactions  .  Codeine Rash    Family History  Problem Relation Age of Onset  . Cancer Mother        breast cancer    Prior to Admission medications   Medication Sig Start Date End Date Taking? Authorizing Provider  apixaban (ELIQUIS) 5 MG TABS tablet Take 1 tablet (5 mg total) by mouth 2 (two) times daily. 09/03/19  Yes Evans Lance, MD   Calcium Carbonate-Vitamin D (CALTRATE 600+D) 600-400 MG-UNIT per tablet Take 1 tablet by mouth at bedtime.    Yes [provider]  docusate sodium (COLACE) 100 MG capsule Take 100 mg by mouth daily.   Yes [provider]  levothyroxine (SYNTHROID) 125 MCG tablet TAKE 1 TABLET(125 MCG) BY MOUTH DAILY 09/03/19  Yes Biagio Borg, MD  Multiple Vitamins-Minerals (CENTRUM SILVER 50+WOMEN) TABS Take 1 tablet by mouth daily.   Yes [provider]  pantoprazole (PROTONIX) 40 MG tablet Take 1 tablet (40 mg total) by mouth daily. 09/03/19  Yes Biagio Borg, MD  polyvinyl alcohol (LIQUIFILM TEARS) 1.4 % ophthalmic solution Place 1 drop into both eyes in the morning, at noon, in the evening, and at bedtime.    Yes [provider]  pravastatin (PRAVACHOL) 10 MG tablet Take 1 tablet (10 mg total) by mouth at bedtime. Patient taking differently: Take 10 mg by mouth daily.  09/03/19  Yes Biagio Borg, MD  psyllium (REGULOID) 0.52 g capsule Take 0.52 g by mouth daily.   Yes [provider]  valsartan (DIOVAN) 80 MG tablet Take 1 tablet (80 mg total) by mouth daily. Take 80 mg by mouth daily. 09/03/19  Yes Biagio Borg, MD    Physical Exam: Constitutional: Moderately built and nourished. Vitals:   12/27/2019 1945 01/24/2020 2000 01/06/2020 2030 01/12/2020 2100  BP:  (!) 147/70 120/70 (!) 159/80  Pulse:  (!) 58 61 60  Resp: (!) 31 14 (!) 24 (!) 37  Temp:      TempSrc:      SpO2:  97% 99% (!) 89%  Weight:       Eyes: Anicteric no pallor. ENMT: No discharge from the ears eyes nose or mouth. Neck: No mass felt.  No neck rigidity. Respiratory: No rhonchi or crepitations. Cardiovascular: S1-S2 heard. Abdomen: Soft nontender bowel sounds present. Musculoskeletal: No edema. Skin: No rash. Neurologic: Alert awake oriented but patient is aphasic. Psychiatric: Patient is aphasic.   Labs on Admission: I have personally reviewed following labs and imaging studies  CBC:  Recent Labs  Lab 01/02/2020 1849  WBC 11.4*  NEUTROABS 9.1*  HGB 12.8  HCT 40.4  MCV 98.1  PLT 0000000   Basic Metabolic Panel: Recent Labs  Lab 01/23/2020 1849  NA 138  K 3.4*  CL 100  CO2 24  GLUCOSE 179*  BUN 41*  CREATININE 2.49*  CALCIUM 8.4*   GFR: Estimated Creatinine Clearance: 12.2 mL/min (A) (by C-G formula based on SCr of 2.49 mg/dL (H)). Liver Function Tests: Recent Labs  Lab 01/23/2020 1849  AST 712*  ALT 1,028*  ALKPHOS 95  BILITOT 1.6*  PROT 6.5  ALBUMIN 3.4*   No results for input(s): LIPASE, AMYLASE in the last 168 hours. No results for input(s): AMMONIA in the last 168 hours. Coagulation Profile: Recent Labs  Lab 01/09/2020 1849  INR 3.4*   Cardiac Enzymes: No results for input(s): CKTOTAL, CKMB, CKMBINDEX, TROPONINI in the last 168 hours. BNP (last 3 results) No results for input(s): PROBNP in the last 8760 hours.  HbA1C: No results for input(s): HGBA1C in the last 72 hours. CBG: Recent Labs  Lab 01/08/2020 1850  GLUCAP 162*   Lipid Profile: No results for input(s): CHOL, HDL, LDLCALC, TRIG, CHOLHDL, LDLDIRECT in the last 72 hours. Thyroid Function Tests: No results for input(s): TSH, T4TOTAL, FREET4, T3FREE, THYROIDAB in the last 72 hours. Anemia Panel: No results for input(s): VITAMINB12, FOLATE, FERRITIN, TIBC, IRON, RETICCTPCT in the last 72 hours. Urine analysis:    Component Value Date/Time   COLORURINE AMBER (A) 01/22/2020 1904   APPEARANCEUR HAZY (A) 01/18/2020 1904   LABSPEC 1.019 01/21/2020 1904   PHURINE 5.0 01/03/2020 1904   GLUCOSEU NEGATIVE 01/06/2020 1904   GLUCOSEU NEGATIVE 10/11/2019 0920   HGBUR NEGATIVE 01/20/2020 1904   BILIRUBINUR NEGATIVE 12/28/2019 1904   KETONESUR NEGATIVE 12/26/2019 1904   PROTEINUR 30 (A) 01/14/2020 1904   UROBILINOGEN 0.2 10/11/2019 0920   NITRITE NEGATIVE 01/08/2020 1904   LEUKOCYTESUR NEGATIVE 01/10/2020 1904   Sepsis Labs: @LABRCNTIP (procalcitonin:4,lacticidven:4) )No results found for  this or any previous visit (from the past 240 hour(s)).   Radiological Exams on Admission: DG Chest Port 1 View  Result Date: 01/08/2020 CLINICAL DATA:  Shortness of breath. Suspected sepsis. EXAM: PORTABLE CHEST 1 VIEW COMPARISON:  Radiograph 12/30/2019. FINDINGS: Dual lead left-sided pacemaker remains in place. Cardiomegaly which is unchanged to slightly increased from prior exam. Unchanged mediastinal contours. Aortic atherosclerosis. Progressive retrocardiac opacity which may be combination of pleural fluid and atelectasis/airspace disease. Patchy right lung base opacities new. Vascular congestion without edema. No pneumothorax. Unchanged osseous structures. IMPRESSION: 1. Retrocardiac opacity which may be combination of pleural fluid and atelectasis/airspace disease such as pneumonia. 2. Patchy right lung base opacities may represent atelectasis or pneumonia. 3. Cardiomegaly with vascular congestion. Aortic Atherosclerosis (ICD10-I70.0). Electronically Signed   By: Keith Rake M.D.   On: 01/12/2020 19:24   DG Abd Portable 1 View  Result Date: 01/08/2020 CLINICAL DATA:  Vomiting and shortness of breath. EXAM: PORTABLE ABDOMEN - 1 VIEW COMPARISON:  None. FINDINGS: Bowel gas pattern does not show ileus or obstruction. Normal amount of fecal matter. Clips in the right upper quadrant consistent with previous cholecystectomy. Previous left hip replacement. Aortic atherosclerosis. IMPRESSION: No acute finding. Normal bowel gas pattern. Normal amount of fecal matter. Unremarkable radiograph for age. Electronically Signed   By: Nelson Chimes M.D.   On: 01/12/2020 21:23    EKG: Independently reviewed.  Junctional rhythm.  Has pacemaker.  Assessment/Plan Principal Problem:   Sepsis ([place]) Active Problems:   Hypothyroidism   Essential hypertension   History of completed stroke   PACEMAKER, PERMANENT   Atrial fibrillation ([place])    1. Sepsis likely from pneumonia for which patient is on empiric  antibiotics follow culture continue hydration.  Lactic acid is improving. 2. Non-ST elevation MI presently on heparin infusion and aspirin.  Cycle cardiac markers.  Cardiology has been notified.  MI likely secondary to sepsis. 3. Acute renal failure likely from vomiting and sepsis.  Continue hydration follow metabolic panel hold ARB. 4. Markedly elevated LFTs likely from sepsis and shock liver.  Will check CT abdomen pelvis to make sure there is no intra-abdominal cause for the sepsis.  Follow LFTs.  Check acute hepatitis panel. 5. Hypothyroidism continue Synthroid. 6. Hypertension we will keep patient as needed IV hydralazine for now. 7. A. fib presently rate controlled presently on heparin for anticoagulation. 8. Mild hypokalemia replace and recheck.  Check magnesium. 9. History of CVA with aphasia. 10. History of heart block status  post pacemaker placement. 11. Recent admission for UTI.  Patient did fail a swallow with speech therapy evaluation.  Given the septic nature of patient's presentation with non-ST elevation MI elevated LFTs acute renal failure will need inpatient status and close monitoring.   DVT prophylaxis: Heparin infusion. Code Status: DNR as confirmed with family. Family Communication: Patient's daughter-in-law. Disposition Plan: Back to facility when stable. Consults called: Cardiology. Admission status: Inpatient.   Rise Patience MD [facility] Pager [phone].  If 7PM-7AM, please contact night-coverage [email or web address] Password [facility]  01/08/2020, 11:56 PM

## 2020-01-13 NOTE — Telephone Encounter (Signed)
Dropping o2, declining, choking on food, she was sent to the ER by [email or web address].

## 2020-01-13 NOTE — ED Notes (Signed)
Date and time results received: 01/12/2020 7:57 PM  (use smartphrase ".now" to insert current time)  Test: Lactic Acid Critical Value: 3.9  Name of Provider Notified: Dr.Goldston  Orders Received? Or Actions Taken?:

## 2020-01-14 ENCOUNTER — Inpatient Hospital Stay (HOSPITAL_COMMUNITY): Payer: Medicare Other

## 2020-01-14 ENCOUNTER — Other Ambulatory Visit: Payer: Self-pay

## 2020-01-14 DIAGNOSIS — R778 Other specified abnormalities of plasma proteins: Secondary | ICD-10-CM | POA: Diagnosis not present

## 2020-01-14 DIAGNOSIS — A419 Sepsis, unspecified organism: Principal | ICD-10-CM

## 2020-01-14 DIAGNOSIS — E039 Hypothyroidism, unspecified: Secondary | ICD-10-CM

## 2020-01-14 DIAGNOSIS — I480 Paroxysmal atrial fibrillation: Secondary | ICD-10-CM

## 2020-01-14 DIAGNOSIS — N170 Acute kidney failure with tubular necrosis: Secondary | ICD-10-CM | POA: Diagnosis not present

## 2020-01-14 DIAGNOSIS — Z8673 Personal history of transient ischemic attack (TIA), and cerebral infarction without residual deficits: Secondary | ICD-10-CM | POA: Diagnosis not present

## 2020-01-14 DIAGNOSIS — R6520 Severe sepsis without septic shock: Secondary | ICD-10-CM

## 2020-01-14 LAB — CBC WITH DIFFERENTIAL/PLATELET
Abs Immature Granulocytes: 0.08 10*3/uL — ABNORMAL HIGH (ref 0.00–0.07)
Basophils Absolute: 0 10*3/uL (ref 0.0–0.1)
Basophils Relative: 0 %
Eosinophils Absolute: 0 10*3/uL (ref 0.0–0.5)
Eosinophils Relative: 0 %
HCT: 37.7 % (ref 36.0–46.0)
Hemoglobin: 12.2 g/dL (ref 12.0–15.0)
Immature Granulocytes: 1 %
Lymphocytes Relative: 15 %
Lymphs Abs: 1.7 10*3/uL (ref 0.7–4.0)
MCH: 31.9 pg (ref 26.0–34.0)
MCHC: 32.4 g/dL (ref 30.0–36.0)
MCV: 98.7 fL (ref 80.0–100.0)
Monocytes Absolute: 1.1 10*3/uL — ABNORMAL HIGH (ref 0.1–1.0)
Monocytes Relative: 10 %
Neutro Abs: 8.6 10*3/uL — ABNORMAL HIGH (ref 1.7–7.7)
Neutrophils Relative %: 74 %
Platelets: 158 10*3/uL (ref 150–400)
RBC: 3.82 MIL/uL — ABNORMAL LOW (ref 3.87–5.11)
RDW: 14.2 % (ref 11.5–15.5)
WBC: 11.5 10*3/uL — ABNORMAL HIGH (ref 4.0–10.5)
nRBC: 0.3 % — ABNORMAL HIGH (ref 0.0–0.2)

## 2020-01-14 LAB — HEPATITIS PANEL, ACUTE
HCV Ab: NONREACTIVE
Hep A IgM: NONREACTIVE
Hep B C IgM: NONREACTIVE
Hepatitis B Surface Ag: NONREACTIVE

## 2020-01-14 LAB — COMPREHENSIVE METABOLIC PANEL
ALT: 831 U/L — ABNORMAL HIGH (ref 0–44)
AST: 531 U/L — ABNORMAL HIGH (ref 15–41)
Albumin: 3.1 g/dL — ABNORMAL LOW (ref 3.5–5.0)
Alkaline Phosphatase: 81 U/L (ref 38–126)
Anion gap: 7 (ref 5–15)
BUN: 40 mg/dL — ABNORMAL HIGH (ref 8–23)
CO2: 25 mmol/L (ref 22–32)
Calcium: 7.7 mg/dL — ABNORMAL LOW (ref 8.9–10.3)
Chloride: 106 mmol/L (ref 98–111)
Creatinine, Ser: 2.11 mg/dL — ABNORMAL HIGH (ref 0.44–1.00)
GFR calc Af Amer: 22 mL/min — ABNORMAL LOW (ref >60)
GFR calc non Af Amer: 19 mL/min — ABNORMAL LOW (ref >60)
Glucose, Bld: 126 mg/dL — ABNORMAL HIGH (ref 70–99)
Potassium: 3.3 mmol/L — ABNORMAL LOW (ref 3.5–5.1)
Sodium: 138 mmol/L (ref 135–145)
Total Bilirubin: 1.5 mg/dL — ABNORMAL HIGH (ref 0.3–1.2)
Total Protein: 5.7 g/dL — ABNORMAL LOW (ref 6.5–8.1)

## 2020-01-14 LAB — CBG MONITORING, ED: Glucose-Capillary: 112 mg/dL — ABNORMAL HIGH (ref 70–99)

## 2020-01-14 LAB — LACTIC ACID, PLASMA: Lactic Acid, Venous: 1.9 mmol/L (ref 0.5–1.9)

## 2020-01-14 LAB — TROPONIN I (HIGH SENSITIVITY)
Troponin I (High Sensitivity): 4045 ng/L (ref <18)
Troponin I (High Sensitivity): 2197 ng/L (ref <18)

## 2020-01-14 LAB — TSH: TSH: 0.279 u[IU]/mL — ABNORMAL LOW (ref 0.350–4.500)

## 2020-01-14 LAB — HEPARIN LEVEL (UNFRACTIONATED): Heparin Unfractionated: >2.2 IU/mL — ABNORMAL HIGH (ref 0.30–0.70)

## 2020-01-14 LAB — APTT: aPTT: 45 seconds — ABNORMAL HIGH (ref 24–36)

## 2020-01-14 LAB — GLUCOSE, CAPILLARY: Glucose-Capillary: 87 mg/dL (ref 70–99)

## 2020-01-14 LAB — SARS CORONAVIRUS 2 (TAT 6-24 HRS): SARS Coronavirus 2: NEGATIVE

## 2020-01-14 LAB — MRSA PCR SCREENING: MRSA by PCR: NEGATIVE

## 2020-01-14 LAB — MAGNESIUM: Magnesium: 2.5 mg/dL — ABNORMAL HIGH (ref 1.7–2.4)

## 2020-01-14 MED ORDER — HEPARIN (PORCINE) 25000 UT/250ML-% IV SOLN
700.0000 [IU]/h | INTRAVENOUS | Status: DC
  Administered 2020-01-14: 800 [IU]/h via INTRAVENOUS
  Filled 2020-01-14: qty 250

## 2020-01-14 MED ORDER — VANCOMYCIN HCL 500 MG/100ML IV SOLN
500.0000 mg | Freq: Once | INTRAVENOUS | Status: AC
  Administered 2020-01-14: 500 mg via INTRAVENOUS
  Filled 2020-01-14: qty 100

## 2020-01-14 MED ORDER — ORAL CARE MOUTH RINSE
15.0000 mL | Freq: Two times a day (BID) | OROMUCOSAL | Status: DC
  Administered 2020-01-14: 15 mL via OROMUCOSAL

## 2020-01-14 MED ORDER — POTASSIUM CHLORIDE 10 MEQ/100ML IV SOLN
10.0000 meq | INTRAVENOUS | Status: AC
  Administered 2020-01-14 (×3): 10 meq via INTRAVENOUS
  Filled 2020-01-14 (×3): qty 100

## 2020-01-14 MED ORDER — VANCOMYCIN HCL 750 MG/150ML IV SOLN: 750.0000 mg | INTRAVENOUS | Status: DC

## 2020-01-14 MED ORDER — POTASSIUM CHLORIDE 10 MEQ/100ML IV SOLN
10.0000 meq | Freq: Once | INTRAVENOUS | Status: AC
  Administered 2020-01-14: 10 meq via INTRAVENOUS
  Filled 2020-01-14: qty 100

## 2020-01-14 MED ORDER — PIPERACILLIN-TAZOBACTAM IN DEX 2-0.25 GM/50ML IV SOLN
2.2500 g | Freq: Four times a day (QID) | INTRAVENOUS | Status: DC
  Filled 2020-01-14: qty 50

## 2020-01-14 MED ORDER — LEVOTHYROXINE SODIUM 100 MCG/5ML IV SOLN
62.5000 ug | Freq: Every day | INTRAVENOUS | Status: DC
  Administered 2020-01-14: 62.5 ug via INTRAVENOUS
  Filled 2020-01-14 (×2): qty 5

## 2020-01-14 MED ORDER — CHLORHEXIDINE GLUCONATE 0.12 % MT SOLN
15.0000 mL | Freq: Two times a day (BID) | OROMUCOSAL | Status: DC
  Administered 2020-01-14: 15 mL via OROMUCOSAL
  Filled 2020-01-14: qty 15

## 2020-01-14 NOTE — Progress Notes (Signed)
[facility]  PROGRESS NOTE  [name] [id number] DOB: [birth year] DOA: 01/12/2020 PCP: Biagio Borg, MD   Brief HPI:   86 year old female with history of CVA with aphasia, complete heart block s/p pacemaker placement, dementia, hypothyroidism, hyperlipidemia who was recently mated for UTI, was found to have increasing shortness of breath after she had 2-3 episodes of vomiting.  In the ED chest x-ray showed infiltrates concerning for pneumonia.  Lactic acid was elevated 3.1.  Troponin was markedly elevated at 5000.  Cardiology was consulted.  Patient was admitted with sepsis due to pneumonia.   Subjective   Patient seen and examined,aphasic.  Appears comfortable.   Assessment/Plan:     1. Sepsis-patient presented with sepsis physiology due to pneumonia.  Seen on chest x-ray.  Started on empiric antibiotics vancomycin and Zosyn.  Lactic acid has improved to 1.9.  Follow blood culture results. 2. NSTEMI-patient presented with significant elevation of troponin.  Cardiology was consulted.  Patient started on heparin infusion.  Medical management only, no aggressive intervention recommended at this time. 3. Transaminitis-likely from sepsis,  shock liver.  LFTs are improving, today AST is 531, ALT of 831.  Follow LFTs in a.m. 4. Hypothyroidism-continue Synthroid. 5. Chronic diastolic CHF-euvolemic.  Not on diuretics.  Cardiology following. 6. Acute kidney injury-patient baseline creatinine was 0.86 on 01/01/2020, she presented with creatinine of 2.49.  Slowly improving to 2.11.  Follow BMP in a.m. 7. History of CVA with aphasia-stable 8. History of heart block-s/p pacemaker placement 9. Atrial fibrillation-heart rate is controlled, continue heparin for anticoagulation. 10. Hypokalemia-potassium was replaced.  Check BMP in a.m.    SpO2: 98 % O2 Flow Rate (L/min): 2 L/min   COVID-19 Labs  No results for input(s): DDIMER, FERRITIN, LDH, CRP in the last 72 hours.  Lab  Results  Component Value Date   SARSCOV2NAA NEGATIVE 12/27/2019   SARSCOV2NAA NEGATIVE 12/30/2019   [place] NEGATIVE 04/15/2019   [place] NEGATIVE 02/20/2019     CBG: Recent Labs  Lab 01/09/2020 1850 01/14/20 0756 01/14/20 1717  GLUCAP 162* 112* 87    CBC: Recent Labs  Lab 01/18/2020 1849 01/14/20 0055  WBC 11.4* 11.5*  NEUTROABS 9.1* 8.6*  HGB 12.8 12.2  HCT 40.4 37.7  MCV 98.1 98.7  PLT 178 0000000    Basic Metabolic Panel: Recent Labs  Lab 01/24/2020 1849 12/27/2019 2354 01/14/20 0040  NA 138 138  --   K 3.4* 3.3*  --   CL 100 106  --   CO2 24 25  --   GLUCOSE 179* 126*  --   BUN 41* 40*  --   CREATININE 2.49* 2.11*  --   CALCIUM 8.4* 7.7*  --   MG  --   --  2.5*     Liver Function Tests: Recent Labs  Lab 01/23/2020 1849 01/24/2020 2354  AST 712* 531*  ALT 1,028* 831*  ALKPHOS 95 81  BILITOT 1.6* 1.5*  PROT 6.5 5.7*  ALBUMIN 3.4* 3.1*        DVT prophylaxis:   Code Status: DNR  Family Communication: No family at bedside  Disposition Plan: Patient admitted with sepsis due to pneumonia.  Barrier to discharge-patient on IV antibiotics for sepsis.      Status is: Inpatient    Dispo: The patient is from: Skilled nursing facility              Anticipated d/c is to: Skilled nursing facility  Anticipated d/c date is: 01/17/2020              Patient currently medically stable        Scheduled medications:  . aspirin  324 mg Oral Once  . aspirin EC  81 mg Oral Daily  . chlorhexidine  15 mL Mouth Rinse BID  . levothyroxine  62.5 mcg Intravenous Daily  . mouth rinse  15 mL Mouth Rinse q12n4p  . pantoprazole  40 mg Oral Daily    Consultants:  Cardiology  Procedures:    Antibiotics:   Anti-infectives (From admission, onward)   Start     Dose/Rate Route Frequency Ordered Stop   28-Jan-2020 2300  vancomycin (VANCOREADY) IVPB 750 mg/150 mL     750 mg 150 mL/hr over 60 Minutes Intravenous Every 48 hours 01/14/20 0625      28-Jan-2020 0600  piperacillin-tazobactam (ZOSYN) IVPB 2.25 g     2.25 g 100 mL/hr over 30 Minutes Intravenous Every 6 hours 01/14/20 0001     01/14/20 0015  vancomycin (VANCOREADY) IVPB 500 mg/100 mL     500 mg 100 mL/hr over 60 Minutes Intravenous  Once 01/14/20 0002 01/14/20 0134   01/12/2020 1945  vancomycin (VANCOCIN) IVPB 1000 mg/200 mL premix     1,000 mg 200 mL/hr over 60 Minutes Intravenous  Once 01/20/2020 1938 01/16/2020 2130   01/17/2020 1930  ceFEPIme (MAXIPIME) 2 g in sodium chloride 0.9 % 100 mL IVPB     2 g 200 mL/hr over 30 Minutes Intravenous  Once 01/02/2020 1916 12/31/2019 2028       Objective   Vitals:   01/14/20 1330 01/14/20 1345 01/14/20 1400 01/14/20 1500  BP:   (!) 136/94 (!) 164/72  Pulse: 60 (!) 59 (!) 59 60  Resp: (!) 29 (!) 32 (!) 25 (!) 26  Temp:    (!) 97.5 F (36.4 C)  TempSrc:    Oral  SpO2: 92% 98% 93% 98%  Weight:        Intake/Output Summary (Last 24 hours) at 01/14/2020 1733 Last data filed at 01/14/2020 1548 Gross per 24 hour  Intake 2063.44 ml  Output --  Net 2063.44 ml    04/18 1901 - 04/20 0700 In: 1295.3  Out: -   Filed Weights   01/18/2020 1839  Weight: 65.8 kg    Physical Examination:    General-appears in no acute distress  Heart-S1-S2, regular, no murmur auscultated  Lungs-clear to auscultation bilaterally, no wheezing or crackles auscultated  Abdomen-soft, nontender, no organomegaly  Extremities-no edema in the lower extremities  Neuro-alert, aphasic    Data Reviewed:   Recent Results (from the past 240 hour(s))  Culture, blood (Routine x 2)     Status: None (Preliminary result)   Collection Time: 12/27/2019  6:49 PM   Specimen: BLOOD  Result Value Ref Range Status   Specimen Description   Final    BLOOD LEFT ANTECUBITAL Performed at [facility], [address], [place], [place] [address]    Special Requests   Final    BOTTLES DRAWN AEROBIC AND ANAEROBIC Blood Culture results may not be  optimal due to an inadequate volume of blood received in culture bottles Performed at [facility] [address], [place], [place] [address]    Culture   Final    NO GROWTH < 24 HOURS Performed at [facility] [address] [place], [place] [address]    Report Status PENDING  Incomplete  Culture,  blood (Routine x 2)     Status: None (Preliminary result)   Collection Time: 01/24/2020  6:49 PM   Specimen: BLOOD  Result Value Ref Range Status   Specimen Description   Final    BLOOD LEFT ANTECUBITAL Performed at [facility] [address], [place], [place] [address]    Special Requests   Final    BOTTLES DRAWN AEROBIC ONLY Blood Culture adequate volume Performed at [facility] [address], [place], [place] [address]    Culture   Final    NO GROWTH < 24 HOURS Performed at [facility] [address] [place], [place] [address]    Report Status PENDING  Incomplete  SARS CORONAVIRUS 2 (TAT 6-24 HRS) Nasopharyngeal Nasopharyngeal Swab     Status: None   Collection Time: 01/14/2020  8:00 PM   Specimen: Nasopharyngeal Swab  Result Value Ref Range Status   SARS Coronavirus 2 NEGATIVE NEGATIVE Final    Comment: (NOTE) SARS-CoV-2 target nucleic acids are NOT DETECTED. The SARS-CoV-2 RNA is generally detectable in upper and lower respiratory specimens during the acute phase of infection. Negative results do not preclude SARS-CoV-2 infection, do not rule out co-infections with other pathogens, and should not be used as the sole basis for treatment or other patient management decisions. Negative results must be combined with clinical observations, patient history, and epidemiological information. The expected result is Negative. Fact Sheet for Patients: SugarRoll.be Fact Sheet for Healthcare Providers: https://www.woods-mathews.com/ This test is not yet approved or cleared  by the [place] FDA and  has been authorized for detection and/or diagnosis of SARS-CoV-2 by FDA under an Emergency Use Authorization (EUA). This EUA will remain  in effect (meaning this test can be used) for the duration of the COVID-19 declaration under Section 56 4(b)(1) of the Act, 21 U.S.C. section 360bbb-3(b)(1), unless the authorization is terminated or revoked sooner. Performed at [facility], [place] [address], [place], [place] [address]     No results for input(s): LIPASE, AMYLASE in the last 168 hours. No results for input(s): AMMONIA in the last 168 hours.  Cardiac Enzymes: No results for input(s): CKTOTAL, CKMB, CKMBINDEX, TROPONINI in the last 168 hours. BNP (last 3 results) Recent Labs    01/05/2020 1848  BNP 558.4*    ProBNP (last 3 results) No results for input(s): PROBNP in the last 8760 hours.  Studies:  CT ABDOMEN PELVIS WO CONTRAST  Result Date: 01/14/2020 CLINICAL DATA:  86 year old female with weakness, sepsis, shortness of breath, abdominal distension. Negative for COVID-19. Yesterday. EXAM: CT ABDOMEN AND PELVIS WITHOUT CONTRAST TECHNIQUE: Multidetector CT imaging of the abdomen and pelvis was performed following the standard protocol without IV contrast. COMPARISON:  CT Abdomen and Pelvis 09/05/2019. Portable chest 01/10/2020. FINDINGS: Lower chest: Stable cardiomegaly. There is a new small pericardial effusion since December (series 2, image 6). Chronic cardiac pacemaker leads. Extensive Calcified aortic atherosclerosis. New bilateral layering pleural effusions since December, small on the left and small to moderate on the right. Simple fluid density suggesting transudate. Associated compressive atelectasis at the lung bases. Underlying chronic lung base scarring. Hepatobiliary: Chronically absent gallbladder. Chronic bile duct enlargement at the liver hilum appears stable. Negative noncontrast liver otherwise. Pancreas: Chronically atrophied. Spleen:  Negative. Adrenals/Urinary Tract: Adrenal glands appear stable. There is new bilateral perinephric stranding. But no definite hydronephrosis and the proximal ureters are both decompressed. Multifocal punctate left nephrolithiasis and/or renal vascular calcifications. No right renal calculus. Streak artifact  in the pelvis related to hip arthroplasty but the urinary bladder appears diminutive and unremarkable. Chronic pelvic phleboliths. Stomach/Bowel: Decreased stool ball in the rectum since December. Redundant sigmoid colon with extensive diverticulosis. No active inflammation identified. Redundant large bowel with mild retained stool elsewhere. Negative terminal ileum. Diminutive or absent appendix. No dilated small bowel. Decompressed stomach also. There is a right lower quadrant small bowel anastomosis with no adverse features redemonstrated on series 2, image 59. No inflamed bowel identified. No free air, free fluid. Vascular/Lymphatic: Vascular patency is not evaluated in the absence of IV contrast. Extensive Aortoiliac calcified atherosclerosis. No lymphadenopathy. Reproductive: Diminutive or absent as before. Other: No pelvic free fluid. Mild to moderate presacral stranding appears stable since December. Musculoskeletal: Osteopenia. Stable L2 compression fracture. Chronic left hip arthroplasty. No acute osseous abnormality identified. IMPRESSION: 1. Bilateral perinephric stranding is new since December, with no evidence of obstructive uropathy. Sequelae of acute renal failure seems most likely. Bilateral pyelonephritis would be a differential consideration in the setting of sepsis. 2. Small new pericardial effusion and small to moderate new layering pleural effusions greater on the right. Lung base atelectasis. 3. Chronic cardiomegaly, extensive Aortic Atherosclerosis (ICD10-I70.0). 4. No other acute or inflammatory process identified in the non-contrast abdomen or pelvis. Electronically Signed   By: Genevie Ann  M.D.   On: 01/14/2020 08:44   DG Chest Port 1 View  Result Date: 12/29/2019 CLINICAL DATA:  Shortness of breath. Suspected sepsis. EXAM: PORTABLE CHEST 1 VIEW COMPARISON:  Radiograph 12/30/2019. FINDINGS: Dual lead left-sided pacemaker remains in place. Cardiomegaly which is unchanged to slightly increased from prior exam. Unchanged mediastinal contours. Aortic atherosclerosis. Progressive retrocardiac opacity which may be combination of pleural fluid and atelectasis/airspace disease. Patchy right lung base opacities new. Vascular congestion without edema. No pneumothorax. Unchanged osseous structures. IMPRESSION: 1. Retrocardiac opacity which may be combination of pleural fluid and atelectasis/airspace disease such as pneumonia. 2. Patchy right lung base opacities may represent atelectasis or pneumonia. 3. Cardiomegaly with vascular congestion. Aortic Atherosclerosis (ICD10-I70.0). Electronically Signed   By: Keith Rake M.D.   On: 12/29/2019 19:24   DG Abd Portable 1 View  Result Date: 01/06/2020 CLINICAL DATA:  Vomiting and shortness of breath. EXAM: PORTABLE ABDOMEN - 1 VIEW COMPARISON:  None. FINDINGS: Bowel gas pattern does not show ileus or obstruction. Normal amount of fecal matter. Clips in the right upper quadrant consistent with previous cholecystectomy. Previous left hip replacement. Aortic atherosclerosis. IMPRESSION: No acute finding. Normal bowel gas pattern. Normal amount of fecal matter. Unremarkable radiograph for age. Electronically Signed   By: Nelson Chimes M.D.   On: 01/16/2020 21:23     Admission status: The appropriate admission status for this patient is INPATIENT. Inpatient status is judged to be reasonable and necessary in order to provide the required intensity of service to ensure the patient's safety. The patient's presenting symptoms, physical exam findings, and initial radiographic and laboratory data in the context of their chronic comorbidities is felt to place them  at high risk for further clinical deterioration. Furthermore, it is not anticipated that the patient will be medically stable for discharge from the hospital within 2 midnights of admission. The following factors support the admission status of inpatient.    The patient's presenting symptoms include shortness of breath The worrisome physical exam findings include none The initial radiographic and laboratory data are worrisome because of congestive heart failure The chronic co-morbidities include CHF.    * I certify that at the point of admission  it is my clinical judgment that the patient will require inpatient hospital care spanning beyond 2 midnights from the point of admission due to high intensity of service, high risk for further deterioration and high frequency of surveillance required.Oswald Hillock   [facility] If 7PM-7AM, please contact night-coverage at [email or web address], Office  [phone]   01/14/2020, 5:33 PM  LOS: 1 day

## 2020-01-14 NOTE — ED Notes (Signed)
Date and time results received: 01/14/20 0131 (use smartphrase ".now" to insert current time)  Test: troponin Critical Value: 4045  Name of Provider Notified: Rachel Moulds   Orders Received? Or Actions Taken?: Orders Received - See Orders for details   Jerl Mina lab

## 2020-01-14 NOTE — Progress Notes (Signed)
Pharmacy Antibiotic Note  [name] is a 86 y.o. female admitted on 01/21/2020 with pneumonia.  Pharmacy has been consulted for Vancomycin and Zosyn dosing.  Plan: Vancomycin 1gm iv x1 (1956), then Vancomycin 500mg  iv x1, for 1500mg  iv x1 total, Vancomycin 750 mg IV Q 48 hrs. Goal AUC 400-550. Expected AUC: 546 SCr used: 2.49  Cefepime 2gm iv x1, then Zosyn 3.375g IV Q8H infused over 4hrs.    Weight: 65.8 kg (145 lb)  Temp (24hrs), Avg:100 F (37.8 C), Min:99 F (37.2 C), Max:101 F (38.3 C)  Recent Labs  Lab 01/19/2020 1849 01/11/2020 2354 01/14/20 0012 01/14/20 0055  WBC 11.4*  --   --  11.5*  CREATININE 2.49* 2.11*  --   --   LATICACIDVEN 3.9*  --  1.9  --     Estimated Creatinine Clearance: 14.4 mL/min (A) (by C-G formula based on SCr of 2.11 mg/dL (H)).    Allergies  Allergen Reactions  . Codeine Rash    Antimicrobials this admission: Vancomycin 01/01/2020 >> Cefepime 01/02/2020 x1 Zosyn 01/22/2020 >>  Dose adjustments this admission: -  Microbiology results: -  Thank you for allowing pharmacy to be a part of this patient's care.  Nani Skillern Crowford 01/14/2020 6:26 AM

## 2020-01-14 NOTE — Progress Notes (Addendum)
[place] for IV heparin Indication: chest pain/ACS >> inability to take Eliquis  Allergies  Allergen Reactions  . Codeine Rash    Patient Measurements: Weight: 65.8 kg (145 lb) Heparin Dosing Weight: TBW  Vital Signs: BP: 143/97 (04/20 1200) Pulse Rate: 59 (04/20 1200)  Labs: Recent Labs    12/30/2019 1849 01/11/2020 2354 01/14/20 0033 01/14/20 0055 01/14/20 0713  HGB 12.8  --   --  12.2  --   HCT 40.4  --   --  37.7  --   PLT 178  --   --  158  --   APTT  --   --  45*  --   --   LABPROT 34.3*  --   --   --   --   INR 3.4*  --   --   --   --   HEPARINUNFRC  --   --  >2.20*  --   --   CREATININE 2.49* 2.11*  --   --   --   TROPONINIHS 5,187* 4,045*  --   --  2,197*    Estimated Creatinine Clearance: 14.4 mL/min (A) (by C-G formula based on SCr of 2.11 mg/dL (H)).   Medical History: Past Medical History:  Diagnosis Date  . Aphasia 01/09/2009  . Arthritis    right hand  . 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 CEREBROVASCULAR ACCIDENT, HX OF 11/20/2007  . CHF (congestive heart failure) ([place])   . Coronary artery disease   . CVA 01/09/2009  . DEMENTIA 05/19/2008  . Dementia ([place])   . DIVERTICULOSIS, COLON 11/20/2007  . FATIGUE 11/20/2007  . Femoral fracture ([place]) 07/01/2013  . GLUCOSE INTOLERANCE 12/06/2010  . HYPERLIPIDEMIA 11/20/2007  . HYPERTENSION 11/20/2007   Echo was done 07/07/11: mod LVH, EF 55-60%, grade 1 diast dysfxn, mild MR, mild LAE, mod TR, PASP 39  . HYPOTHYROIDISM 11/20/2007  . Impaired glucose tolerance 06/03/2011  . IRON DEFICIENCY 12/06/2010  . Pacemaker 2002  . PACEMAKER, PERMANENT 01/09/2009  . TIA 05/14/2003   elevated CE's in setting of TIA and falls in 10/12 - echo normal with no further cardiac w/u pursued  . TIA (transient ischemic attack) 10/10/2011  . Urinary incontinence 01/09/2009    Medications:  Medications Prior to Admission  Medication Sig Dispense Refill Last Dose  . apixaban (ELIQUIS) 5 MG TABS tablet  Take 1 tablet (5 mg total) by mouth 2 (two) times daily. 60 tablet 11 01/01/2020 at 0730  . Calcium Carbonate-Vitamin D (CALTRATE 600+D) 600-400 MG-UNIT per tablet Take 1 tablet by mouth at bedtime.    01/12/2020 at Unknown time  . docusate sodium (COLACE) 100 MG capsule Take 100 mg by mouth daily.   01/07/2020 at Unknown time  . levothyroxine (SYNTHROID) 125 MCG tablet TAKE 1 TABLET(125 MCG) BY MOUTH DAILY 90 tablet 1 01/18/2020 at Unknown time  . Multiple Vitamins-Minerals (CENTRUM SILVER 50+WOMEN) TABS Take 1 tablet by mouth daily.   01/20/2020 at Unknown time  . pantoprazole (PROTONIX) 40 MG tablet Take 1 tablet (40 mg total) by mouth daily. 90 tablet 1 01/18/2020 at Unknown time  . polyvinyl alcohol (LIQUIFILM TEARS) 1.4 % ophthalmic solution Place 1 drop into both eyes in the morning, at noon, in the evening, and at bedtime.    01/10/2020 at Unknown time  . pravastatin (PRAVACHOL) 10 MG tablet Take 1 tablet (10 mg total) by mouth at bedtime. (Patient taking differently: Take 10 mg by mouth daily. ) 90 tablet 1  01/12/2020 at Unknown time  . psyllium (REGULOID) 0.52 g capsule Take 0.52 g by mouth daily.   01/20/2020 at Unknown time  . valsartan (DIOVAN) 80 MG tablet Take 1 tablet (80 mg total) by mouth daily. Take 80 mg by mouth daily. 90 tablet 1 01/16/2020 at Unknown time   Scheduled:  . aspirin  324 mg Oral Once  . aspirin EC  81 mg Oral Daily  . chlorhexidine  15 mL Mouth Rinse BID  . levothyroxine  62.5 mcg Intravenous Daily  . mouth rinse  15 mL Mouth Rinse q12n4p  . pantoprazole  40 mg Oral Daily   Infusions:  . heparin    . lactated ringers 75 mL/hr at 01/14/20 0035  . [START ON 2020-01-16] piperacillin-tazobactam (ZOSYN)  IV    . potassium chloride 10 mEq (01/14/20 1548)  . [START ON 2020/01/16] vancomycin      Assessment: 89 yoF with PMH dCHF, CHB s/p PPM, persistent Afib on Eliquis, CVA with hemiparesis/aphasia, dementia, admitted for sepsis felt d/t pneumonia. Noted to have elevated  troponins on admission. Pharmacy consulted to dose heparin infusion for NSTEMI. Cardiology consulted and felt elevated troponins likely d/t demand ischemia in setting of sepsis, but were agreeable to continue heparin as patient unable to take Eliquis at this time.   Baseline INR, aPTT: both elevated d/t accumulated Eliquis  Prior anticoagulation: Eliquis 5 mg PO bid, last dose 4/19 AM  Significant events:  Today, 01/14/2020:  CBC: wnl  SCr acutely up (baseline ~0.9 earlier this month)  No bleeding or infusion issues per nursing  Goal of Therapy: Heparin level 0.3-0.7 units/ml Monitor platelets by anticoagulation protocol: Yes  Plan:  Heparin 800 units/hr IV infusion, no bolus  Check aPTT 8 hrs after start; anticipate heparin level will be falsely elevated for some time d/t lingering DOAC effects  Daily CBC, daily heparin level once stable  Monitor for signs of bleeding or thrombosis  F/u for ability to resume Eliquis (may need to adjust dose if AKI not fully resolved - f/u SCr)  Reuel Boom, PharmD, BCPS [phone] 01/14/2020, 4:05 PM

## 2020-01-14 NOTE — ED Notes (Signed)
Pt transferred from ED bed to Hospital bed with staffx2. Pt is resting comfortably and call bell is within reach.

## 2020-01-14 NOTE — Telephone Encounter (Signed)
Thank you.  Remains in hospital for sepsis secondary to pneumonia likely aspiration.

## 2020-01-14 NOTE — Progress Notes (Signed)
ANTICOAGULATION CONSULT NOTE - Initial Consult  Pharmacy Consult for Heparin (when INR < 2) Indication: atrial fibrillation  Allergies  Allergen Reactions  . Codeine Rash    Patient Measurements: Weight: 65.8 kg (145 lb) Heparin Dosing Weight:   Vital Signs: Temp: 99 F (37.2 C) (04/19 2200) Temp Source: Rectal (04/19 2200) BP: 155/72 (04/20 0430) Pulse Rate: 61 (04/20 0400)  Labs: Recent Labs    01/08/2020 1849 01/23/2020 2354 01/14/20 0033 01/14/20 0055  HGB 12.8  --   --  12.2  HCT 40.4  --   --  37.7  PLT 178  --   --  158  APTT  --   --  45*  --   LABPROT 34.3*  --   --   --   INR 3.4*  --   --   --   HEPARINUNFRC  --   --  >2.20*  --   CREATININE 2.49* 2.11*  --   --   TROPONINIHS 5,187* 4,045*  --   --     Estimated Creatinine Clearance: 14.4 mL/min (A) (by C-G formula based on SCr of 2.11 mg/dL (H)).   Medical History: Past Medical History:  Diagnosis Date  . Aphasia 01/09/2009  . Arthritis    right hand  . 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 CEREBROVASCULAR ACCIDENT, HX OF 11/20/2007  . CHF (congestive heart failure) ([place])   . Coronary artery disease   . CVA 01/09/2009  . DEMENTIA 05/19/2008  . Dementia ([place])   . DIVERTICULOSIS, COLON 11/20/2007  . FATIGUE 11/20/2007  . Femoral fracture ([place]) 07/01/2013  . GLUCOSE INTOLERANCE 12/06/2010  . HYPERLIPIDEMIA 11/20/2007  . HYPERTENSION 11/20/2007   Echo was done 07/07/11: mod LVH, EF 55-60%, grade 1 diast dysfxn, mild MR, mild LAE, mod TR, PASP 39  . HYPOTHYROIDISM 11/20/2007  . Impaired glucose tolerance 06/03/2011  . IRON DEFICIENCY 12/06/2010  . Pacemaker 2002  . PACEMAKER, PERMANENT 01/09/2009  . TIA 05/14/2003   elevated CE's in setting of TIA and falls in 10/12 - echo normal with no further cardiac w/u pursued  . TIA (transient ischemic attack) 10/10/2011  . Urinary incontinence 01/09/2009    Medications:  Infusions:  . lactated ringers 75 mL/hr at 01/14/20 0035  . [START ON 2020/02/07]  piperacillin-tazobactam (ZOSYN)  IV    . [START ON 07-Feb-2020] vancomycin      Assessment: Patient with apixiban use prior to admit for afib.  Last dose noted 4/19.  INR on admit is > 2.  MD made aware, and directed to hold heparin until INR < 2.  Goal of Therapy:  Heparin level 0.3-0.7 units/ml Monitor platelets by anticoagulation protocol: Yes   Plan:  Hold heparin at this time Check INR with 4/21 AM labs  Tyler Deis, Shea Stakes Crowford 01/14/2020,6:28 AM

## 2020-01-14 NOTE — Consult Note (Addendum)
Cardiology Consultation:   Patient ID: [name]; [id number]; June 03, 1922   Admit date: 01/09/2020 Date of Consult: 01/14/2020  Primary Care Provider: Biagio Borg, MD Primary Cardiologist: No primary care provider on file. Primary Electrophysiologist:  [name], MD  Patient Profile:   [name] is a 86 y.o. 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 Hal Hope.  History of Present Illness:   Ms. [name] was recently admitted from 12/30/19-01/01/20 for UTI, discharged back to [place] where she continued to feel poorly. There was mention of difficulty swallowing in a telephone encounter since discharge. It appears she had 2-3 episodes of vomiting as well, followed by increasing SOB. EMS was activated and she was brought to [facility] ED for further evaluation.   She was last evaluated by cardiology via a telemedicine visit with Dr. Lovena Le (EP) 08/2019, at which time she had worsening peripheral edema at which time she was recommended to discontinue amlodipine, start carvedilol 3.18m BID, and increased her lasix to 444mdaily. Her PPM was functioning normally. She was recommended to follow-up in 6 months. Her last echo 11/2018 showed EF 55%, moderate LVH, indeterminate LV diastolic function, no RWMA, severely dilated LA, mildly dilated RA, moderate to severe MAC with mild MR/MS, and moderate TR.   At the time of this evaluation she continues to feel SOB. Aphasia limits history taking and there is no family at bedside. No complaints of chest pain recently and she denies DOE prior to this episode. No complaints of palpitations, dizziness, lightheadedness, or syncope. She reports intermittent LE edema which has been stable.    Hospital course: febrile to 101, HR in the 60s, intermittently tachypneic, hypoxic to the 80s, and hypertensive. Labs notable for K 3.3, Mg 2.5, Cr 2.49>2.11 (baseline 0.9), AST 712>531, ALT 1028>831, WBC 11.4, Hgb 12.8, PLT 178, INR 3.4, TSH 0.279 Lactate 3.9>1.9, HsTrop [phone] EKG with V paced rhythm, rate 60 bpm. CXR with retrocardiac opacity c/f fluid vs PNA with patchy RLL opacity c/f PNA, and vascular congestion. AXR without acute findings. CT A/P revealed perinephric stranding c/w AKI though cannot exclude bilateral pyelonephritis, small pericardial effusion with small to moderate pleural effusions (R>L), and extensive aortic atherosclerosis. She was admitted to medicine for sepsis likely 2/2 PNA. She was started on broad spectrum antibiotics. She was given IVFs for management of her AKI. Cardiology asked to evaluate for elevated troponins.     Past Medical History:  Diagnosis Date  . Aphasia 01/09/2009  . Arthritis    right hand  . 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 CEREBROVASCULAR ACCIDENT, HX OF 11/20/2007  . CHF (congestive heart failure) ([facility]  . Coronary artery disease   . CVA 01/09/2009  . DEMENTIA 05/19/2008  . Dementia ([facility]  . DIVERTICULOSIS, COLON 11/20/2007  . FATIGUE 11/20/2007  . Femoral fracture ([place]/02/2013  . GLUCOSE INTOLERANCE 12/06/2010  . HYPERLIPIDEMIA 11/20/2007  . HYPERTENSION 11/20/2007   Echo was done 07/07/11: mod LVH, EF 55-60%, grade 1 diast dysfxn, mild MR, mild LAE, mod TR, PASP 39  . HYPOTHYROIDISM 11/20/2007  . Impaired glucose tolerance 06/03/2011  . IRON DEFICIENCY 12/06/2010  . Pacemaker 2002  . PACEMAKER, PERMANENT 01/09/2009  . TIA 05/14/2003   elevated CE's in setting of TIA and falls  in 10/12 - echo normal with no further cardiac w/u pursued  . TIA (transient ischemic attack) 10/10/2011  . Urinary incontinence 01/09/2009    Past Surgical History:  Procedure Laterality Date  . ABDOMINAL HYSTERECTOMY    . CHOLECYSTECTOMY    .  CYSTOCELE REPAIR N/A 11/26/2012   Procedure: Cystocele Repair with Lefort Colpocleisis.  colpectomy Levatorplasty Perineorraphy;  Surgeon: Lovenia Kim, MD;  Location: [facility];  Service: Gynecology;  Laterality: N/A;  Cystocele Repair with Lefort Colpocleisis.  Marland Kitchen HIP ARTHROPLASTY Left 07/02/2013   Procedure: ARTHROPLASTY BIPOLAR HIP;  Surgeon: Mauri Pole, MD;  Location: [facility];  Service: Orthopedics;  Laterality: Left;  . INGUINAL HERNIA REPAIR Right 08/13/2013   Procedure: HERNIA REPAIR INGUINAL INCARCERATED;  Surgeon: Ralene Ok, MD;  Location: [facility];  Service: General;  Laterality: Right;  . Medtronic Cynthia dual chamber pacemaker serial number was [id number] H...04/15/2009    . PPM GENERATOR CHANGEOUT N/A 04/15/2019   Procedure: PPM GENERATOR CHANGEOUT;  Surgeon: Evans Lance, MD;  Location: [facility];  Service: Cardiovascular;  Laterality: N/A;  . s/p cerebral aneurysm  1991  . s/p pacemaker DDD    . s/p retinal attachment    . s/p thyroidectomy       Home Medications:  Prior to Admission medications   Medication Sig Start Date End Date Taking? Authorizing Provider  apixaban (ELIQUIS) 5 MG TABS tablet Take 1 tablet (5 mg total) by mouth 2 (two) times daily. 09/03/19  Yes Evans Lance, MD  Calcium Carbonate-Vitamin D (CALTRATE 600+D) 600-400 MG-UNIT per tablet Take 1 tablet by mouth at bedtime.    Yes [provider]  docusate sodium (COLACE) 100 MG capsule Take 100 mg by mouth daily.   Yes [provider]  levothyroxine (SYNTHROID) 125 MCG tablet TAKE 1 TABLET(125 MCG) BY MOUTH DAILY 09/03/19  Yes Biagio Borg, MD  Multiple Vitamins-Minerals (CENTRUM SILVER 50+WOMEN) TABS Take 1 tablet by mouth daily.   Yes [provider]  pantoprazole (PROTONIX) 40 MG tablet Take 1 tablet (40 mg total) by mouth daily. 09/03/19  Yes Biagio Borg, MD  polyvinyl alcohol (LIQUIFILM TEARS) 1.4 % ophthalmic solution Place 1 drop into both eyes in the morning, at  noon, in the evening, and at bedtime.    Yes [provider]  pravastatin (PRAVACHOL) 10 MG tablet Take 1 tablet (10 mg total) by mouth at bedtime. Patient taking differently: Take 10 mg by mouth daily.  09/03/19  Yes Biagio Borg, MD  psyllium (REGULOID) 0.52 g capsule Take 0.52 g by mouth daily.   Yes [provider]  valsartan (DIOVAN) 80 MG tablet Take 1 tablet (80 mg total) by mouth daily. Take 80 mg by mouth daily. 09/03/19  Yes Biagio Borg, MD    Inpatient Medications: Scheduled Meds: . aspirin  324 mg Oral Once  . aspirin EC  81 mg Oral Daily  . levothyroxine  62.5 mcg Intravenous Daily  . pantoprazole  40 mg Oral Daily   Continuous Infusions: . lactated ringers 75 mL/hr at 01/14/20 0035  . [START ON 2020/02/04] piperacillin-tazobactam (ZOSYN)  IV    . [START ON 2020-02-04] vancomycin     PRN Meds: ondansetron **OR** ondansetron (ZOFRAN) IV  Allergies:    Allergies  Allergen Reactions  . Codeine Rash    Social History:   Social History   Socioeconomic History  . Marital status: Divorced    Spouse name: Not on file  . Number of  children: Not on file  . Years of education: Not on file  . Highest education level: Not on file  Occupational History  . Occupation: retired Radiation protection practitioner, lives at [employer]  . Smoking status: Never Smoker  . Smokeless tobacco: Never Used  Substance and Sexual Activity  . Alcohol use: No  . Drug use: No  . Sexual activity: Not on file  Other Topics Concern  . Not on file  Social History Narrative  . Not on file   Social Determinants of Health   Financial Resource Strain:   . Difficulty of Paying Living Expenses:   Food Insecurity:   . Worried About Charity fundraiser in the Last Year:   . Arboriculturist in the Last Year:   Transportation Needs:   . Film/video editor (Medical):   Marland Kitchen Lack of Transportation (Non-Medical):   Physical Activity:   . Days of Exercise per Week:   . Minutes of  Exercise per Session:   Stress:   . Feeling of Stress :   Social Connections:   . Frequency of Communication with Friends and Family:   . Frequency of Social Gatherings with Friends and Family:   . Attends Religious Services:   . Active Member of Clubs or Organizations:   . Attends Archivist Meetings:   Marland Kitchen Marital Status:   Intimate Partner Violence:   . Fear of Current or Ex-Partner:   . Emotionally Abused:   Marland Kitchen Physically Abused:   . Sexually Abused:     Family History:    Family History  Problem Relation Age of Onset  . Cancer Mother        breast cancer     ROS:  Please see the history of present illness.  ROS  All other ROS reviewed and negative.     Physical Exam/Data:   Vitals:   01/14/20 0630 01/14/20 0819 01/14/20 0901 01/14/20 0902  BP: 97/87 (!) 152/87 (!) 155/68   Pulse: 76 65 62 60  Resp: (!) 39 (!) 38 18 (!) 37  Temp:      TempSrc:      SpO2: 99% 99% 100% 99%  Weight:        Intake/Output Summary (Last 24 hours) at 01/14/2020 1000 Last data filed at 01/14/2020 0134 Gross per 24 hour  Intake 1295.32 ml  Output --  Net 1295.32 ml   Filed Weights   12/31/2019 1839  Weight: 65.8 kg   Body mass index is 24.13 kg/m.  General:  Elderly female laying in bed with increased work of breathing HEENT: sclera anicteric  Neck: no JVD Vascular: No carotid bruits; distal pulses 2+ bilaterally Cardiac:  normal S1, S2; RRR; no murmurs, rubs, or gallops Lungs: decreased breath sounds throughout Abd: NABS, soft, nontender, no hepatomegaly Ext: no edema Musculoskeletal:  No deformities, BUE and BLE strength normal and equal Skin: warm and dry  Neuro:  CNs 2-12 intact, no focal abnormalities noted Psych:  Normal affect   EKG:  The EKG was personally reviewed and demonstrates:  V paced rhythm, rate 60 bpm Telemetry:  Telemetry was personally reviewed and demonstrates:  V-paced rhythm, rate 60s-70s  Relevant CV Studies: Echocardiogram 11/2018: 1. The  left ventricle has a visually estimated ejection fraction of of  55%. The cavity size was normal. There is moderately increased left  ventricular wall thickness. Left ventricular diastolic Doppler parameters  are indeterminate. No evidence of left  ventricular regional wall motion abnormalities. Septal-lateral  dyssynchrony consistent with RV pacing.  2. The right ventricle has normal systolic function. The cavity was  normal. There is no increase in right ventricular wall thickness.  3. Left atrial size was severely dilated.  4. Right atrial size was mildly dilated.  5. The mitral valve is degenerative. Moderate calcification of the mitral  valve leaflet. There is moderate to severe mitral annular calcification  present. Mild mitral regurgitation. There is no more than mild mitral  stenosis present, mean gradient 4  mmHg.  6. Tricuspid valve regurgitation is moderate.  7. The aortic valve is tricuspid Moderate calcification of the aortic  valve. Aortic valve regurgitation is moderate by color flow Doppler. no  stenosis of the aortic valve.  8. The ascending aorta is normal in size and structure.  9. There is mild dilatation of the aortic root measuring 37 mm.  10. The inferior vena cava was dilated in size with <50% respiratory  variability. PA systolic pressure 45 mmHg.  11. Trivial pericardial effusion is present.   Laboratory Data:  Chemistry Recent Labs  Lab 01/19/2020 1849 01/06/2020 2354  NA 138 138  K 3.4* 3.3*  CL 100 106  CO2 24 25  GLUCOSE 179* 126*  BUN 41* 40*  CREATININE 2.49* 2.11*  CALCIUM 8.4* 7.7*  GFRNONAA 16* 19*  GFRAA 18* 22*  ANIONGAP 14 7    Recent Labs  Lab 01/19/2020 1849 01/10/2020 2354  PROT 6.5 5.7*  ALBUMIN 3.4* 3.1*  AST 712* 531*  ALT 1,028* 831*  ALKPHOS 95 81  BILITOT 1.6* 1.5*   Hematology Recent Labs  Lab 01/04/2020 1849 01/14/20 0055  WBC 11.4* 11.5*  RBC 4.12 3.82*  HGB 12.8 12.2  HCT 40.4 37.7  MCV 98.1 98.7  MCH  31.1 31.9  MCHC 31.7 32.4  RDW 14.2 14.2  PLT 178 158   Cardiac EnzymesNo results for input(s): TROPONINI in the last 168 hours. No results for input(s): TROPIPOC in the last 168 hours.  BNP Recent Labs  Lab 01/23/2020 1848  BNP 558.4*    DDimer No results for input(s): DDIMER in the last 168 hours.  Radiology/Studies:  CT ABDOMEN PELVIS WO CONTRAST  Result Date: 01/14/2020 CLINICAL DATA:  86 year old female with weakness, sepsis, shortness of breath, abdominal distension. Negative for COVID-19. Yesterday. EXAM: CT ABDOMEN AND PELVIS WITHOUT CONTRAST TECHNIQUE: Multidetector CT imaging of the abdomen and pelvis was performed following the standard protocol without IV contrast. COMPARISON:  CT Abdomen and Pelvis 09/05/2019. Portable chest 12/27/2019. FINDINGS: Lower chest: Stable cardiomegaly. There is a new small pericardial effusion since December (series 2, image 6). Chronic cardiac pacemaker leads. Extensive Calcified aortic atherosclerosis. New bilateral layering pleural effusions since December, small on the left and small to moderate on the right. Simple fluid density suggesting transudate. Associated compressive atelectasis at the lung bases. Underlying chronic lung base scarring. Hepatobiliary: Chronically absent gallbladder. Chronic bile duct enlargement at the liver hilum appears stable. Negative noncontrast liver otherwise. Pancreas: Chronically atrophied. Spleen: Negative. Adrenals/Urinary Tract: Adrenal glands appear stable. There is new bilateral perinephric stranding. But no definite hydronephrosis and the proximal ureters are both decompressed. Multifocal punctate left nephrolithiasis and/or renal vascular calcifications. No right renal calculus. Streak artifact in the pelvis related to hip arthroplasty but the urinary bladder appears diminutive and unremarkable. Chronic pelvic phleboliths. Stomach/Bowel: Decreased stool ball in the rectum since December. Redundant sigmoid colon with  extensive diverticulosis. No active inflammation identified. Redundant large bowel with mild retained stool elsewhere. Negative terminal ileum. Diminutive or  absent appendix. No dilated small bowel. Decompressed stomach also. There is a right lower quadrant small bowel anastomosis with no adverse features redemonstrated on series 2, image 59. No inflamed bowel identified. No free air, free fluid. Vascular/Lymphatic: Vascular patency is not evaluated in the absence of IV contrast. Extensive Aortoiliac calcified atherosclerosis. No lymphadenopathy. Reproductive: Diminutive or absent as before. Other: No pelvic free fluid. Mild to moderate presacral stranding appears stable since December. Musculoskeletal: Osteopenia. Stable L2 compression fracture. Chronic left hip arthroplasty. No acute osseous abnormality identified. IMPRESSION: 1. Bilateral perinephric stranding is new since December, with no evidence of obstructive uropathy. Sequelae of acute renal failure seems most likely. Bilateral pyelonephritis would be a differential consideration in the setting of sepsis. 2. Small new pericardial effusion and small to moderate new layering pleural effusions greater on the right. Lung base atelectasis. 3. Chronic cardiomegaly, extensive Aortic Atherosclerosis (ICD10-I70.0). 4. No other acute or inflammatory process identified in the non-contrast abdomen or pelvis. Electronically Signed   By: Genevie Ann M.D.   On: 01/14/2020 08:44   DG Chest Port 1 View  Result Date: 01/17/2020 CLINICAL DATA:  Shortness of breath. Suspected sepsis. EXAM: PORTABLE CHEST 1 VIEW COMPARISON:  Radiograph 12/30/2019. FINDINGS: Dual lead left-sided pacemaker remains in place. Cardiomegaly which is unchanged to slightly increased from prior exam. Unchanged mediastinal contours. Aortic atherosclerosis. Progressive retrocardiac opacity which may be combination of pleural fluid and atelectasis/airspace disease. Patchy right lung base opacities new.  Vascular congestion without edema. No pneumothorax. Unchanged osseous structures. IMPRESSION: 1. Retrocardiac opacity which may be combination of pleural fluid and atelectasis/airspace disease such as pneumonia. 2. Patchy right lung base opacities may represent atelectasis or pneumonia. 3. Cardiomegaly with vascular congestion. Aortic Atherosclerosis (ICD10-I70.0). Electronically Signed   By: Keith Rake M.D.   On: 12/29/2019 19:24   DG Abd Portable 1 View  Result Date: 12/30/2019 CLINICAL DATA:  Vomiting and shortness of breath. EXAM: PORTABLE ABDOMEN - 1 VIEW COMPARISON:  None. FINDINGS: Bowel gas pattern does not show ileus or obstruction. Normal amount of fecal matter. Clips in the right upper quadrant consistent with previous cholecystectomy. Previous left hip replacement. Aortic atherosclerosis. IMPRESSION: No acute finding. Normal bowel gas pattern. Normal amount of fecal matter. Unremarkable radiograph for age. Electronically Signed   By: Nelson Chimes M.D.   On: 01/22/2020 21:23    Assessment and Plan:   1. Elevated troponins: HsTrop trend: [phone].  EKG with V paced rhythm. No anginal complaints. Likely demand ischemia in the setting of sepsis.  - Could consider a repeat echo, though this is unlikely to change management.  - Given advanced age and comorbidities she is not a candidate for further ischemic evaluation  2. Sepsis 2/2 PNA: patient had 2-3 episodes of emesis, followed by increasing SOB prior to admission. Suspect aspiration. She failed a swallow eval this admission. Lactate elevated to 3.9, Trops elevated to 5000s, Cr elevated to 2.49, and AST/ALT elevated to 700s/1000s respectively; all trending down.  - Continue management per primary team.   3. Persistent atrial fibrillation: V-paced rhythm on EKG. Noted to be in persistent Afib on last device interrogation 09/2019. Eliquis on hold given coagulopathy in the setting of shock liver.  - Anticipate resuming eliquis as  Cr and INR improve. May need to consider reduced dose eliquis if Cr does not return to baseline  4. Chronic diastolic CHF: she appears euvolemic on exam today, though certainly at risk for exacerbation with fluid resuscitation in the setting of sepsis. - Continue  to monitor volume status closely  5. Hypokalemia: K 3.3 today. She is NPO due to failed swallow study. S/p 10 mEq IV K today - Will order an additional 3 runs at this time.   6. HTN: BP intermittently elevated. Home valsartan on hold given AKI - Continue prn IV hydralazine for now  7. AKI: Cr peaked at 2.49, down to 2.11 today after IVFs. Home Valsartan on hold - Continue to monitor closely  8. Transaminitis: LFTs trending down. Likely shock liver in the setting of #2. Home pravastatin on hold. - Continue to monitor closely  9. HLD: LDL 48 11/2018. Home pravastatin on hold given #8 - Resume pravastatin once LFTs normalize  10. CHB s/p PPM: normal device function 09/2019.  - Continue routine outpatient monitoring.    For questions or updates, please contact [place] Please consult [email or web address] for contact info under Cardiology/STEMI.   Signed, Abigail Butts, PA-C  01/14/2020 10:00 AM [phone] Patient seen and examined.  I agree with findings as noted above by Lucianne Muss.  Briefly patient is a 86 year old with a history of persistent atrial fibrillation, chronic diastolic heart failure, hypertension, renal insufficiency.  She was recently discharged from [facility] after being treated for urinary infection.  Just prior to admission she had a few episodes of vomiting and increased shortness of breath. The patient is being treated for probable sepsis.  She is febrile.  IV antibiotics started.  IV fluids given.  We are asked to see regarding elevated troponin.  Patient is difficult given for history given that she is relatively aphasic.  Her granddaughter in law is here.  She says the patient had an  episode of chest pain earlier today but not yesterday.  The granddaughter will in law does report since the stroke last March she was recovering some but in the fall had difficulties with multiple urine infections.  Also now speech and communication has declined.  On examination the patient appears relatively comfortable in bed.  She occasionally gets agitated.  JVP is not increased Lungs with mild rhonchi bilaterally Cardiac exam: Regular rate and rhythm no significant murmurs Abdomen mild epigastric and lower mid abdominal discomfort with palpation no rebound. Extremities with trivial edema.  Labs significant for creatinine 2.49 AST ALT of 712 and 1028; troponin values 5.87, 4045, 2197.  EKG is with ventricular pacing.  Chest x-ray with retrocardiac opacity and patchy right lower lobe opacities.  Troponin elevation is most likely due to demand in the setting of sepsis.  I am not convinced it is the primary driver for the patient's presentation.  Again she probably has coronary artery disease but with her age and comorbidities would recommend more conservative management.  Would recommend a 2D echocardiogram to evaluate compared to function back in March 2020.  Continue to treat infection and provide supportive care.  Watch I's and O's closely.  She may need Lasix soon but would like creatinine to improve before that.  The patient has not been on anticoagulation given neurologic events in the past.  Continue to follow.  Dorris Carnes MD

## 2020-01-14 NOTE — Progress Notes (Signed)
Notified bedside nurse of need to draw lactic acid.  

## 2020-01-14 NOTE — Telephone Encounter (Signed)
thanks

## 2020-01-15 ENCOUNTER — Telehealth: Payer: Self-pay

## 2020-01-15 ENCOUNTER — Inpatient Hospital Stay (HOSPITAL_COMMUNITY): Payer: Medicare Other

## 2020-01-15 LAB — COMPREHENSIVE METABOLIC PANEL
ALT: 688 U/L — ABNORMAL HIGH (ref 0–44)
AST: 303 U/L — ABNORMAL HIGH (ref 15–41)
Albumin: 3.1 g/dL — ABNORMAL LOW (ref 3.5–5.0)
Alkaline Phosphatase: 135 U/L — ABNORMAL HIGH (ref 38–126)
Anion gap: 17 — ABNORMAL HIGH (ref 5–15)
BUN: 36 mg/dL — ABNORMAL HIGH (ref 8–23)
CO2: 19 mmol/L — ABNORMAL LOW (ref 22–32)
Calcium: 8.2 mg/dL — ABNORMAL LOW (ref 8.9–10.3)
Chloride: 106 mmol/L (ref 98–111)
Creatinine, Ser: 1.25 mg/dL — ABNORMAL HIGH (ref 0.44–1.00)
GFR calc Af Amer: 42 mL/min — ABNORMAL LOW (ref >60)
GFR calc non Af Amer: 37 mL/min — ABNORMAL LOW (ref >60)
Glucose, Bld: 104 mg/dL — ABNORMAL HIGH (ref 70–99)
Potassium: 4.4 mmol/L (ref 3.5–5.1)
Sodium: 142 mmol/L (ref 135–145)
Total Bilirubin: 2.2 mg/dL — ABNORMAL HIGH (ref 0.3–1.2)
Total Protein: 5.7 g/dL — ABNORMAL LOW (ref 6.5–8.1)

## 2020-01-15 LAB — CBC
HCT: 41 % (ref 36.0–46.0)
Hemoglobin: 12.9 g/dL (ref 12.0–15.0)
MCH: 31.9 pg (ref 26.0–34.0)
MCHC: 31.5 g/dL (ref 30.0–36.0)
MCV: 101.2 fL — ABNORMAL HIGH (ref 80.0–100.0)
Platelets: 158 10*3/uL (ref 150–400)
RBC: 4.05 MIL/uL (ref 3.87–5.11)
RDW: 14.1 % (ref 11.5–15.5)
WBC: 12.3 10*3/uL — ABNORMAL HIGH (ref 4.0–10.5)
nRBC: 0.2 % (ref 0.0–0.2)

## 2020-01-15 LAB — APTT: aPTT: 123 seconds — ABNORMAL HIGH (ref 24–36)

## 2020-01-15 LAB — D-DIMER, QUANTITATIVE: D-Dimer, Quant: 1.17 ug/mL-FEU — ABNORMAL HIGH (ref 0.00–0.50)

## 2020-01-15 LAB — GLUCOSE, CAPILLARY: Glucose-Capillary: 129 mg/dL — ABNORMAL HIGH (ref 70–99)

## 2020-01-15 MED ORDER — SODIUM CHLORIDE 0.9 % IV BOLUS: 500.0000 mL | Freq: Once | INTRAVENOUS | Status: DC

## 2020-01-15 MED ORDER — PIPERACILLIN-TAZOBACTAM 3.375 G IVPB: 3.3750 g | Freq: Three times a day (TID) | INTRAVENOUS | Status: DC

## 2020-01-18 LAB — CULTURE, BLOOD (ROUTINE X 2)
Culture: NO GROWTH
Culture: NO GROWTH
Special Requests: ADEQUATE

## 2020-01-21 ENCOUNTER — Encounter: Payer: Medicare Other | Admitting: Internal Medicine

## 2020-01-25 NOTE — Telephone Encounter (Signed)
New message    [name] calling wanted Dr. Jenny Reichmann to know the patient passed away this morning.

## 2020-01-25 NOTE — Progress Notes (Signed)
RN went in about 0515 to hang patients morning antibiotics. Patient found to be minimally responsive w/ fixed gaze. Breathing tachypneic and shallow, respirations 50-60 per minute. Rapid response and respiratory called. On-call hospitalist notified and called to bedside. Patients vital signs continued to decline and patients breathing became very agonal. Patient then stopped breathing. On-call hospitalist at bedside to pronounce patient dead. Hospitalist called family and signed death certificate. [facility] notified of death. Family at bedside.

## 2020-01-25 NOTE — Progress Notes (Signed)
Received call from 4th floor nurse @ 0522, requesting rapid response nurse to room 1444.  Nurse advised that RR 9, but sats 97-98%, but patient more lethargic with a marked change in neuro status.  I advised to call respiratory to have them assess if ABG is warranted and I would be up as soon as a could clear this call and if anything changes call me back.  Received call at 0548 from 4th floor nurse advising that they needed me ASAP in room 1444.  I immediately responded to their second call.  When I arrived in room at 0550.  Saralyn Pilar, RT was in room along with a few 4th floor nurses.  M Denny,NP had also just entered the room before me.  Pt was unresponsive and apneic.  DNR status confirmed on chart.  M Denny,NP at bedside and pronounced patient's death at 22.  [place] / Care Coordinator / Rapid Response Nurse ICU/SD Unit (2 [facility]) [place], [facility] Rapid Response Number:  [phone] ICU Charge Nurse Number:  [phone]

## 2020-01-25 NOTE — Progress Notes (Signed)
ANTICOAGULATION CONSULT NOTE - Follow Up Consult  Pharmacy Consult for Heparin Indication: chest pain/ACS and atrial fibrillation  Allergies  Allergen Reactions  . Codeine Rash    Patient Measurements: Height: 5\' 5"  (165.1 cm) Weight: 65.8 kg (145 lb) IBW/kg (Calculated) : 57 Heparin Dosing Weight:   Vital Signs: Temp: 98.5 F (36.9 C) (04/20 2213) Temp Source: Oral (04/20 2213) BP: 167/74 (04/20 2213) Pulse Rate: 67 (04/20 2213)  Labs: Recent Labs    01/11/2020 1849 01/06/2020 1849 01/06/2020 2354 01/14/20 0033 01/14/20 0055 01/14/20 0713 2020-02-07 0053  HGB 12.8   < >  --   --  12.2  --  12.9  HCT 40.4  --   --   --  37.7  --  41.0  PLT 178  --   --   --  158  --  158  APTT  --   --   --  45*  --   --  123*  LABPROT 34.3*  --   --   --   --   --   --   INR 3.4*  --   --   --   --   --   --   HEPARINUNFRC  --   --   --  >2.20*  --   --   --   CREATININE 2.49*  --  2.11*  --   --   --  1.25*  TROPONINIHS 5,187*  --  4,045*  --   --  2,197*  --    < > = values in this interval not displayed.    Estimated Creatinine Clearance: 24.2 mL/min (A) (by C-G formula based on SCr of 1.25 mg/dL (H)).   Medications:  Infusions:  . heparin 800 Units/hr (01/14/20 1729)  . piperacillin-tazobactam (ZOSYN)  IV    . vancomycin      Assessment: Patient with high PTT.  No heparin issues per RN.  PTT ordered with Heparin level until both correlate due to possible drug-lab interaction between oral anticoagulant (rivaroxaban, edoxaban, or apixaban) and anti-Xa level (aka heparin level)   Goal of Therapy:  Heparin level 0.3-0.7 units/ml aPTT 66-102 seconds Monitor platelets by anticoagulation protocol: Yes   Plan:  Decrease heparin to 700 units/hr Recheck PTT and heparin level at [address], [place] Feb 07, 2020,2:29 AM

## 2020-01-25 DEATH — deceased

## 2020-02-25 NOTE — Discharge Summary (Signed)
Death Summary  STPEHANIE ANDERT [id number] DOB: [birth year] DOA: 02/09/2020  PCP: Biagio Borg, MD   Admit date: 2020/02/09 Date of Death: 2020-03-08  Final Diagnoses:  Principal Problem:   Sepsis ([place]) Active Problems:   Hypothyroidism   Essential hypertension   History of completed stroke   PACEMAKER, PERMANENT   Atrial fibrillation ([place])    1. Sepsis due to pneumonia 2. NSTEMI 3. Chronic diastolic CHF 4. History of CVA 5. Atrial fibrillation  History of present illness:  86 year old female with history of CVA with aphasia, complete heart block s/p pacemaker placement, dementia, hypothyroidism, hyperlipidemia who was recently mated for UTI, was found to have increasing shortness of breath after she had 2-3 episodes of vomiting.  In the ED chest x-ray showed infiltrates concerning for pneumonia.  Lactic acid was elevated 3.1.  Troponin was markedly elevated at 5000.  Cardiology was consulted.  Patient was admitted with sepsis due to pneumonia.  Hospital Course:  Patient admitted with sepsis due to pneumonia, started on vancomycin and Zosyn.  Also had NSTEMI had significant elevated troponin, she was started on heparin.  Medical management only was recommended by cardiology.  No aggressive intervention was recommended.  Patient also had transaminitis with elevated LFTs secondary to sepsis. Patient expired on Feb 11, 2020.  Patient was DNR   Time: Time of death 0552  Signed:  [facility] 03/08/2020, 12:48 PM

## 2020-03-10 ENCOUNTER — Ambulatory Visit: Payer: Medicare Other | Admitting: Adult Health
